# Patient Record
Sex: Male | Born: 1950
Health system: Southern US, Community
[De-identification: ages and names within clinical notes are randomized; demographics above are authoritative.]

## PROBLEM LIST (undated history)

## (undated) DIAGNOSIS — K219 Gastro-esophageal reflux disease without esophagitis: Secondary | ICD-10-CM

## (undated) DIAGNOSIS — C9110 Chronic lymphocytic leukemia of B-cell type not having achieved remission: Secondary | ICD-10-CM

## (undated) DIAGNOSIS — F418 Other specified anxiety disorders: Secondary | ICD-10-CM

## (undated) DIAGNOSIS — M1990 Unspecified osteoarthritis, unspecified site: Secondary | ICD-10-CM

## (undated) DIAGNOSIS — Z720 Tobacco use: Secondary | ICD-10-CM

## (undated) DIAGNOSIS — J189 Pneumonia, unspecified organism: Secondary | ICD-10-CM

## (undated) DIAGNOSIS — I4510 Unspecified right bundle-branch block: Secondary | ICD-10-CM

## (undated) DIAGNOSIS — B259 Cytomegaloviral disease, unspecified: Secondary | ICD-10-CM

## (undated) DIAGNOSIS — R002 Palpitations: Secondary | ICD-10-CM

## (undated) DIAGNOSIS — J449 Chronic obstructive pulmonary disease, unspecified: Secondary | ICD-10-CM

## (undated) DIAGNOSIS — E785 Hyperlipidemia, unspecified: Secondary | ICD-10-CM

## (undated) DIAGNOSIS — I359 Nonrheumatic aortic valve disorder, unspecified: Secondary | ICD-10-CM

## (undated) HISTORY — DX: Chronic lymphocytic leukemia of B-cell type not having achieved remission: C91.10

## (undated) HISTORY — DX: Cytomegaloviral disease, unspecified: B25.9

## (undated) HISTORY — DX: Gastro-esophageal reflux disease without esophagitis: K21.9

## (undated) HISTORY — DX: Pneumonia, unspecified organism: J18.9

## (undated) HISTORY — DX: Unspecified right bundle-branch block: I45.10

## (undated) HISTORY — DX: Nonrheumatic aortic valve disorder, unspecified: I35.9

## (undated) HISTORY — DX: Chronic obstructive pulmonary disease, unspecified: J44.9

## (undated) HISTORY — DX: Unspecified osteoarthritis, unspecified site: M19.90

## (undated) HISTORY — DX: Tobacco use: Z72.0

## (undated) HISTORY — DX: Hyperlipidemia, unspecified: E78.5

## (undated) HISTORY — DX: Other specified anxiety disorders: F41.8

## (undated) HISTORY — DX: Palpitations: R00.2

---

## 1995-04-02 HISTORY — PX: GANGLION CYST EXCISION: SHX1691

## 1995-04-02 HISTORY — PX: ROTATOR CUFF REPAIR: SHX139

## 2001-09-28 ENCOUNTER — Ambulatory Visit (HOSPITAL_COMMUNITY): Admission: RE | Admit: 2001-09-28 | Discharge: 2001-09-28 | Payer: Self-pay | Admitting: Gastroenterology

## 2002-04-01 DIAGNOSIS — R002 Palpitations: Secondary | ICD-10-CM

## 2002-04-01 HISTORY — DX: Palpitations: R00.2

## 2003-01-12 ENCOUNTER — Encounter: Payer: Self-pay | Admitting: Family Medicine

## 2003-01-12 ENCOUNTER — Ambulatory Visit (HOSPITAL_COMMUNITY): Admission: RE | Admit: 2003-01-12 | Discharge: 2003-01-12 | Payer: Self-pay | Admitting: Family Medicine

## 2003-01-31 ENCOUNTER — Ambulatory Visit (HOSPITAL_COMMUNITY): Admission: RE | Admit: 2003-01-31 | Discharge: 2003-01-31 | Payer: Self-pay | Admitting: *Deleted

## 2003-02-14 ENCOUNTER — Encounter: Admission: RE | Admit: 2003-02-14 | Discharge: 2003-02-14 | Payer: Self-pay | Admitting: Oncology

## 2003-02-14 ENCOUNTER — Encounter (HOSPITAL_COMMUNITY): Admission: RE | Admit: 2003-02-14 | Discharge: 2003-03-16 | Payer: Self-pay | Admitting: Oncology

## 2003-04-28 ENCOUNTER — Encounter: Admission: RE | Admit: 2003-04-28 | Discharge: 2003-04-28 | Payer: Self-pay | Admitting: Oncology

## 2003-04-28 ENCOUNTER — Encounter (HOSPITAL_COMMUNITY): Admission: RE | Admit: 2003-04-28 | Discharge: 2003-05-28 | Payer: Self-pay | Admitting: Oncology

## 2003-07-04 ENCOUNTER — Encounter (HOSPITAL_COMMUNITY): Admission: RE | Admit: 2003-07-04 | Discharge: 2003-08-03 | Payer: Self-pay | Admitting: Oncology

## 2003-07-04 ENCOUNTER — Encounter: Admission: RE | Admit: 2003-07-04 | Discharge: 2003-07-04 | Payer: Self-pay | Admitting: Oncology

## 2004-01-02 ENCOUNTER — Encounter (HOSPITAL_COMMUNITY): Admission: RE | Admit: 2004-01-02 | Discharge: 2004-02-01 | Payer: Self-pay | Admitting: Oncology

## 2004-01-02 ENCOUNTER — Encounter: Admission: RE | Admit: 2004-01-02 | Discharge: 2004-01-02 | Payer: Self-pay | Admitting: Oncology

## 2004-02-14 ENCOUNTER — Ambulatory Visit (HOSPITAL_COMMUNITY): Admission: RE | Admit: 2004-02-14 | Discharge: 2004-02-14 | Payer: Self-pay | Admitting: Family Medicine

## 2004-04-09 ENCOUNTER — Ambulatory Visit (HOSPITAL_COMMUNITY): Admission: RE | Admit: 2004-04-09 | Discharge: 2004-04-09 | Payer: Self-pay | Admitting: Family Medicine

## 2004-06-29 ENCOUNTER — Ambulatory Visit (HOSPITAL_COMMUNITY): Payer: Self-pay | Admitting: Oncology

## 2004-06-29 ENCOUNTER — Encounter (HOSPITAL_COMMUNITY): Admission: RE | Admit: 2004-06-29 | Discharge: 2004-07-29 | Payer: Self-pay | Admitting: Oncology

## 2004-06-29 ENCOUNTER — Encounter: Admission: RE | Admit: 2004-06-29 | Discharge: 2004-06-29 | Payer: Self-pay | Admitting: Oncology

## 2004-08-06 ENCOUNTER — Ambulatory Visit (HOSPITAL_COMMUNITY): Admission: RE | Admit: 2004-08-06 | Discharge: 2004-08-06 | Payer: Self-pay | Admitting: Family Medicine

## 2004-10-22 ENCOUNTER — Ambulatory Visit (HOSPITAL_COMMUNITY): Admission: RE | Admit: 2004-10-22 | Discharge: 2004-10-22 | Payer: Self-pay | Admitting: Family Medicine

## 2004-11-26 ENCOUNTER — Ambulatory Visit (HOSPITAL_BASED_OUTPATIENT_CLINIC_OR_DEPARTMENT_OTHER): Admission: RE | Admit: 2004-11-26 | Discharge: 2004-11-26 | Payer: Self-pay | Admitting: Otolaryngology

## 2004-12-03 ENCOUNTER — Ambulatory Visit: Payer: Self-pay | Admitting: Internal Medicine

## 2005-01-04 ENCOUNTER — Encounter: Admission: RE | Admit: 2005-01-04 | Discharge: 2005-01-04 | Payer: Self-pay | Admitting: Oncology

## 2005-01-04 ENCOUNTER — Encounter (HOSPITAL_COMMUNITY): Admission: RE | Admit: 2005-01-04 | Discharge: 2005-02-03 | Payer: Self-pay | Admitting: Oncology

## 2005-01-07 ENCOUNTER — Ambulatory Visit (HOSPITAL_COMMUNITY): Payer: Self-pay | Admitting: Oncology

## 2005-02-12 ENCOUNTER — Encounter: Admission: RE | Admit: 2005-02-12 | Discharge: 2005-02-12 | Payer: Self-pay | Admitting: Oncology

## 2005-04-15 ENCOUNTER — Ambulatory Visit (HOSPITAL_COMMUNITY): Admission: RE | Admit: 2005-04-15 | Discharge: 2005-04-15 | Payer: Self-pay | Admitting: Family Medicine

## 2005-07-19 ENCOUNTER — Ambulatory Visit (HOSPITAL_COMMUNITY): Payer: Self-pay | Admitting: Oncology

## 2005-07-22 ENCOUNTER — Encounter: Admission: RE | Admit: 2005-07-22 | Discharge: 2005-07-22 | Payer: Self-pay | Admitting: Oncology

## 2005-07-22 ENCOUNTER — Encounter (HOSPITAL_COMMUNITY): Admission: RE | Admit: 2005-07-22 | Discharge: 2005-08-21 | Payer: Self-pay | Admitting: Oncology

## 2005-07-22 ENCOUNTER — Ambulatory Visit (HOSPITAL_COMMUNITY): Admission: RE | Admit: 2005-07-22 | Discharge: 2005-07-22 | Payer: Self-pay | Admitting: Otolaryngology

## 2005-09-30 ENCOUNTER — Ambulatory Visit: Payer: Self-pay | Admitting: *Deleted

## 2005-10-07 ENCOUNTER — Ambulatory Visit: Payer: Self-pay | Admitting: Internal Medicine

## 2005-10-07 ENCOUNTER — Ambulatory Visit (HOSPITAL_COMMUNITY): Admission: RE | Admit: 2005-10-07 | Discharge: 2005-10-07 | Payer: Self-pay | Admitting: Family Medicine

## 2005-10-09 ENCOUNTER — Encounter (HOSPITAL_COMMUNITY): Admission: RE | Admit: 2005-10-09 | Discharge: 2005-11-08 | Payer: Self-pay | Admitting: Oncology

## 2005-10-09 ENCOUNTER — Ambulatory Visit (HOSPITAL_COMMUNITY): Payer: Self-pay | Admitting: Oncology

## 2005-10-09 ENCOUNTER — Encounter: Admission: RE | Admit: 2005-10-09 | Discharge: 2005-10-09 | Payer: Self-pay | Admitting: Oncology

## 2005-10-14 ENCOUNTER — Ambulatory Visit (HOSPITAL_COMMUNITY): Admission: RE | Admit: 2005-10-14 | Discharge: 2005-10-14 | Payer: Self-pay | Admitting: *Deleted

## 2005-10-14 ENCOUNTER — Ambulatory Visit: Payer: Self-pay | Admitting: Cardiology

## 2005-10-22 ENCOUNTER — Ambulatory Visit: Payer: Self-pay | Admitting: *Deleted

## 2006-01-06 ENCOUNTER — Encounter (HOSPITAL_COMMUNITY): Admission: RE | Admit: 2006-01-06 | Discharge: 2006-02-05 | Payer: Self-pay | Admitting: Oncology

## 2006-01-06 ENCOUNTER — Encounter: Admission: RE | Admit: 2006-01-06 | Discharge: 2006-01-06 | Payer: Self-pay | Admitting: Oncology

## 2006-01-06 ENCOUNTER — Ambulatory Visit (HOSPITAL_COMMUNITY): Payer: Self-pay | Admitting: Oncology

## 2006-03-03 ENCOUNTER — Ambulatory Visit (HOSPITAL_COMMUNITY): Payer: Self-pay | Admitting: Oncology

## 2006-03-03 ENCOUNTER — Encounter (HOSPITAL_COMMUNITY): Admission: RE | Admit: 2006-03-03 | Discharge: 2006-03-31 | Payer: Self-pay | Admitting: Oncology

## 2006-04-28 ENCOUNTER — Ambulatory Visit (HOSPITAL_COMMUNITY): Payer: Self-pay | Admitting: Oncology

## 2006-04-28 ENCOUNTER — Encounter (HOSPITAL_COMMUNITY): Admission: RE | Admit: 2006-04-28 | Discharge: 2006-05-28 | Payer: Self-pay | Admitting: Oncology

## 2006-11-11 ENCOUNTER — Ambulatory Visit (HOSPITAL_COMMUNITY): Admission: RE | Admit: 2006-11-11 | Discharge: 2006-11-11 | Payer: Self-pay | Admitting: Family Medicine

## 2006-12-08 ENCOUNTER — Ambulatory Visit (HOSPITAL_COMMUNITY): Payer: Self-pay | Admitting: Oncology

## 2007-04-20 ENCOUNTER — Ambulatory Visit: Payer: Self-pay | Admitting: Cardiology

## 2007-06-05 ENCOUNTER — Ambulatory Visit (HOSPITAL_COMMUNITY): Payer: Self-pay | Admitting: Oncology

## 2007-06-05 ENCOUNTER — Encounter (HOSPITAL_COMMUNITY): Admission: RE | Admit: 2007-06-05 | Discharge: 2007-07-05 | Payer: Self-pay | Admitting: Oncology

## 2007-12-11 ENCOUNTER — Ambulatory Visit (HOSPITAL_COMMUNITY): Payer: Self-pay | Admitting: Oncology

## 2008-06-27 ENCOUNTER — Ambulatory Visit (HOSPITAL_COMMUNITY): Payer: Self-pay | Admitting: Oncology

## 2008-06-27 ENCOUNTER — Encounter (HOSPITAL_COMMUNITY): Admission: RE | Admit: 2008-06-27 | Discharge: 2008-07-28 | Payer: Self-pay | Admitting: Oncology

## 2008-07-15 ENCOUNTER — Encounter (INDEPENDENT_AMBULATORY_CARE_PROVIDER_SITE_OTHER): Payer: Self-pay | Admitting: *Deleted

## 2008-08-08 ENCOUNTER — Ambulatory Visit (HOSPITAL_COMMUNITY): Admission: RE | Admit: 2008-08-08 | Discharge: 2008-08-08 | Payer: Self-pay | Admitting: Family Medicine

## 2009-01-02 ENCOUNTER — Ambulatory Visit (HOSPITAL_COMMUNITY): Payer: Self-pay | Admitting: Oncology

## 2009-01-02 ENCOUNTER — Encounter (HOSPITAL_COMMUNITY): Admission: RE | Admit: 2009-01-02 | Discharge: 2009-02-01 | Payer: Self-pay | Admitting: Oncology

## 2009-02-10 ENCOUNTER — Ambulatory Visit (HOSPITAL_COMMUNITY): Admission: RE | Admit: 2009-02-10 | Discharge: 2009-02-10 | Payer: Self-pay | Admitting: Family Medicine

## 2009-07-03 ENCOUNTER — Ambulatory Visit (HOSPITAL_COMMUNITY): Payer: Self-pay | Admitting: Oncology

## 2009-07-03 ENCOUNTER — Encounter (HOSPITAL_COMMUNITY): Admission: RE | Admit: 2009-07-03 | Discharge: 2009-08-02 | Payer: Self-pay | Admitting: Oncology

## 2009-11-20 ENCOUNTER — Ambulatory Visit (HOSPITAL_COMMUNITY): Payer: Self-pay | Admitting: Oncology

## 2009-11-20 ENCOUNTER — Encounter (HOSPITAL_COMMUNITY): Admission: RE | Admit: 2009-11-20 | Discharge: 2009-12-20 | Payer: Self-pay | Admitting: Oncology

## 2010-03-05 ENCOUNTER — Ambulatory Visit (HOSPITAL_COMMUNITY)
Admission: RE | Admit: 2010-03-05 | Discharge: 2010-03-05 | Payer: Self-pay | Source: Home / Self Care | Admitting: Family Medicine

## 2010-05-14 ENCOUNTER — Ambulatory Visit (HOSPITAL_COMMUNITY): Payer: Medicare Other | Admitting: Oncology

## 2010-06-04 ENCOUNTER — Encounter (HOSPITAL_COMMUNITY): Payer: Medicare Other | Attending: Oncology

## 2010-06-04 ENCOUNTER — Other Ambulatory Visit (HOSPITAL_COMMUNITY): Payer: Medicare Other

## 2010-06-04 DIAGNOSIS — F17200 Nicotine dependence, unspecified, uncomplicated: Secondary | ICD-10-CM | POA: Insufficient documentation

## 2010-06-04 DIAGNOSIS — D839 Common variable immunodeficiency, unspecified: Secondary | ICD-10-CM | POA: Insufficient documentation

## 2010-06-04 DIAGNOSIS — J449 Chronic obstructive pulmonary disease, unspecified: Secondary | ICD-10-CM | POA: Insufficient documentation

## 2010-06-04 DIAGNOSIS — C9110 Chronic lymphocytic leukemia of B-cell type not having achieved remission: Secondary | ICD-10-CM

## 2010-06-04 DIAGNOSIS — Z79899 Other long term (current) drug therapy: Secondary | ICD-10-CM | POA: Insufficient documentation

## 2010-06-04 DIAGNOSIS — J4489 Other specified chronic obstructive pulmonary disease: Secondary | ICD-10-CM | POA: Insufficient documentation

## 2010-06-11 ENCOUNTER — Ambulatory Visit (HOSPITAL_COMMUNITY): Payer: Medicare Other | Admitting: Oncology

## 2010-06-11 ENCOUNTER — Other Ambulatory Visit (HOSPITAL_COMMUNITY): Payer: Self-pay | Admitting: Oncology

## 2010-06-11 DIAGNOSIS — C9110 Chronic lymphocytic leukemia of B-cell type not having achieved remission: Secondary | ICD-10-CM

## 2010-06-14 LAB — DIFFERENTIAL
Basophils Absolute: 0 10*3/uL (ref 0.0–0.1)
Basophils Relative: 0 % (ref 0–1)
Eosinophils Absolute: 0 10*3/uL (ref 0.0–0.7)
Eosinophils Relative: 0 % (ref 0–5)
Lymphocytes Relative: 86 % — ABNORMAL HIGH (ref 12–46)
Lymphs Abs: 72.5 10*3/uL — ABNORMAL HIGH (ref 0.7–4.0)
Monocytes Absolute: 4.2 10*3/uL — ABNORMAL HIGH (ref 0.1–1.0)
Monocytes Relative: 5 % (ref 3–12)
Neutro Abs: 7.6 10*3/uL (ref 1.7–7.7)
Neutrophils Relative %: 9 % — ABNORMAL LOW (ref 43–77)

## 2010-06-14 LAB — COMPREHENSIVE METABOLIC PANEL
ALT: 15 U/L (ref 0–53)
AST: 19 U/L (ref 0–37)
Albumin: 4.3 g/dL (ref 3.5–5.2)
Alkaline Phosphatase: 93 U/L (ref 39–117)
BUN: 17 mg/dL (ref 6–23)
CO2: 27 mEq/L (ref 19–32)
Calcium: 9 mg/dL (ref 8.4–10.5)
Chloride: 107 mEq/L (ref 96–112)
Creatinine, Ser: 0.9 mg/dL (ref 0.4–1.5)
GFR calc Af Amer: >60 mL/min (ref >60)
GFR calc non Af Amer: >60 mL/min (ref >60)
Glucose, Bld: 103 mg/dL — ABNORMAL HIGH (ref 70–99)
Potassium: 4.6 mEq/L (ref 3.5–5.1)
Sodium: 137 mEq/L (ref 135–145)
Total Bilirubin: 0.6 mg/dL (ref 0.3–1.2)
Total Protein: 6.1 g/dL (ref 6.0–8.3)

## 2010-06-14 LAB — PROTEIN ELECTROPHORESIS, SERUM
Albumin ELP: 68.3 % — ABNORMAL HIGH (ref 55.8–66.1)
Alpha-1-Globulin: 5.2 % — ABNORMAL HIGH (ref 2.9–4.9)
Alpha-2-Globulin: 10.1 % (ref 7.1–11.8)
Beta 2: 2.3 % — ABNORMAL LOW (ref 3.2–6.5)
Beta Globulin: 5.7 % (ref 4.7–7.2)
Gamma Globulin: 8.4 % — ABNORMAL LOW (ref 11.1–18.8)
M-Spike, %: NOT DETECTED g/dL
Total Protein ELP: 6.3 g/dL (ref 6.0–8.3)

## 2010-06-14 LAB — CBC
HCT: 44.9 % (ref 39.0–52.0)
Hemoglobin: 14.7 g/dL (ref 13.0–17.0)
MCH: 30.6 pg (ref 26.0–34.0)
MCHC: 32.7 g/dL (ref 30.0–36.0)
MCV: 93.5 fL (ref 78.0–100.0)
Platelets: 180 10*3/uL (ref 150–400)
RBC: 4.81 MIL/uL (ref 4.22–5.81)
RDW: 12.9 % (ref 11.5–15.5)
WBC: 84.3 10*3/uL (ref 4.0–10.5)

## 2010-06-14 LAB — IMMUNOFIXATION ADD-ON

## 2010-06-14 LAB — LACTATE DEHYDROGENASE: LDH: 122 U/L (ref 94–250)

## 2010-06-20 LAB — DIFFERENTIAL
Basophils Absolute: 0 10*3/uL (ref 0.0–0.1)
Basophils Relative: 0 % (ref 0–1)
Eosinophils Absolute: 0 10*3/uL (ref 0.0–0.7)
Eosinophils Relative: 0 % (ref 0–5)
Lymphocytes Relative: 82 % — ABNORMAL HIGH (ref 12–46)
Lymphs Abs: 77 10*3/uL — ABNORMAL HIGH (ref 0.7–4.0)
Monocytes Absolute: 7.5 10*3/uL — ABNORMAL HIGH (ref 0.1–1.0)
Monocytes Relative: 8 % (ref 3–12)
Neutro Abs: 9.4 10*3/uL — ABNORMAL HIGH (ref 1.7–7.7)
Neutrophils Relative %: 10 % — ABNORMAL LOW (ref 43–77)

## 2010-06-20 LAB — CBC
HCT: 46.3 % (ref 39.0–52.0)
Hemoglobin: 15.5 g/dL (ref 13.0–17.0)
MCHC: 33.4 g/dL (ref 30.0–36.0)
MCV: 93.1 fL (ref 78.0–100.0)
Platelets: 190 10*3/uL (ref 150–400)
RBC: 4.98 MIL/uL (ref 4.22–5.81)
RDW: 13.6 % (ref 11.5–15.5)
WBC: 93.9 10*3/uL (ref 4.0–10.5)

## 2010-07-05 LAB — COMPREHENSIVE METABOLIC PANEL
ALT: 15 U/L (ref 0–53)
AST: 19 U/L (ref 0–37)
Albumin: 4.3 g/dL (ref 3.5–5.2)
CO2: 29 mEq/L (ref 19–32)
Calcium: 9 mg/dL (ref 8.4–10.5)
GFR calc Af Amer: >60 mL/min (ref >60)
GFR calc non Af Amer: >60 mL/min (ref >60)
Sodium: 141 mEq/L (ref 135–145)

## 2010-07-05 LAB — DIFFERENTIAL
Eosinophils Absolute: 0.2 10*3/uL (ref 0.0–0.7)
Eosinophils Relative: 0 % (ref 0–5)
Lymphs Abs: 57.7 10*3/uL — ABNORMAL HIGH (ref 0.7–4.0)
Monocytes Absolute: 1.6 10*3/uL — ABNORMAL HIGH (ref 0.1–1.0)
Monocytes Relative: 2 % — ABNORMAL LOW (ref 3–12)

## 2010-07-05 LAB — CBC
MCHC: 33.7 g/dL (ref 30.0–36.0)
RBC: 4.96 MIL/uL (ref 4.22–5.81)
WBC: 67.3 10*3/uL (ref 4.0–10.5)

## 2010-07-12 LAB — CBC
HCT: 48.5 % (ref 39.0–52.0)
Hemoglobin: 16.4 g/dL (ref 13.0–17.0)
MCHC: 33.8 g/dL (ref 30.0–36.0)
MCV: 92.8 fL (ref 78.0–100.0)
RDW: 13.1 % (ref 11.5–15.5)

## 2010-07-12 LAB — DIFFERENTIAL
Basophils Relative: 0 % (ref 0–1)
Eosinophils Absolute: 0 10*3/uL (ref 0.0–0.7)
Eosinophils Relative: 0 % (ref 0–5)
Lymphs Abs: 45.9 10*3/uL — ABNORMAL HIGH (ref 0.7–4.0)
Monocytes Absolute: 2.3 10*3/uL — ABNORMAL HIGH (ref 0.1–1.0)
Neutro Abs: 9.2 10*3/uL — ABNORMAL HIGH (ref 1.7–7.7)

## 2010-08-14 NOTE — Letter (Signed)
April 20, 2007    Patrica Duel, M.D.  [address]  [place], [place] [address]   RE:  KEREM, GILMER  MRN:  [id number]  /  DOB:  [birth year]   Dear [name] Leriche:   Mr. Sevin was seen in the office today at your kind request for  palpitations.  As you know, this gentleman was previously seen by Dr.  Dorethea Clan for chest discomfort.  He had a borderline abnormal stress  Myoview study in 2004 and a an essentially normal study in 2007.  He is  said to have a bicuspid aortic valve, but his last echocardiogram did  not verify this.  Otherwise, he has no significant known heart disease  but does have a markedly positive family history.   In recent months, he has noted episodic sensations in his chest that he  finds difficult to characterize.  He does have a sense of something  coming up into his throat.  These are momentary and were astutely  identified by you as palpitations.  He has no exertional symptoms.  He  has been started on metoprolol with marked improvement.  He continues to  have some of these sensations, but they are decreased in frequency and  intensity.   He has chronic leukemia and is followed by Dr. Mariel Sleet.  He received a  few courses of chemotherapy, but has not required treatment of late.  He  continues to smoke cigarettes.  His history since his last office visit  is otherwise negative.   Family history, past medical, social history and review of systems were  reviewed.  There are no notable changes.   CURRENT MEDICATIONS:  1. Aspirin 325 mg daily.  2. Wellbutrin 150 mg daily.  3. Allegra 180 mg daily.  4. Nasonex.  5. Advicor 500/20 mg daily.  6. Calcium.  7. Fish oil.  8. Toprol 50 mg daily.   EXAM:  Pleasant gentleman in no acute distress.  The weight is 193, four  pounds more than in July 2007.  Blood pressure 125/70, heart rate 80  with some irregularity, respirations 16.  NECK:  No jugular venous distention; normal carotid upstrokes without  bruits.  HEENT:  Stained teeth in poor repair.  LUNGS:  Clear.  CARDIAC:  Normal first and second heart sounds; minimal systolic murmur  at the left sternal border; normal PMI.  ABDOMEN:  Soft and nontender; no splenomegaly appreciated; normal bowel  sounds, no bruits.  EXTREMITIES:  No edema; normal distal pulses.  NEUROMUSCULAR:  Symmetric strength and tone; normal cranial nerves.  ENDOCRINE:  No thyromegaly.  HEMATOPOIETIC:  No adenopathy.  PSYCHIATRIC:  Alert and oriented; normal affect.   Rhythm strip:  Sinus rhythm with sinus arrhythmia.  With prolonged  monitoring, a few PVCs occurred.  These reproduce the patient's  symptoms.   IMPRESSION:  Mr. Granquist has palpitations related to premature  ventricular contractions.  With a negative echocardiogram and stress  test in the past, these are almost certainly benign.  We will check a  magnesium level and a TSH level, but I expect normal results.  He can  use Toprol on a p.r.n. basis.  Please let me know at any time if I can  offer further assistance with this nice gentleman's care.    Sincerely,      Gerrit Friends. Dietrich Pates, MD, [facility]  Electronically Signed    RMR/MedQ  DD: 04/20/2007  DT: 04/20/2007  Job #: [id number]

## 2010-08-17 NOTE — Group Therapy Note (Signed)
   NAME:  Alfred Brown, Alfred Brown                          ACCOUNT NO.:  [id number]   MEDICAL RECORD NO.:  [id number]                   PATIENT TYPE:  OUT   LOCATION:  RAD                                  FACILITY:  [facility]   PHYSICIAN:  Vida Roller, M.D.                DATE OF BIRTH:  19-Jun-1950   DATE OF PROCEDURE:  01/31/2003  DATE OF DISCHARGE:                                   PROGRESS NOTE   INDICATIONS:  Mr. Krapf is a 60 year old male with no known history of  coronary artery disease who presents for evaluation of exertional dyspnea in  the context of an abnormal electrocardiogram with cardiac risk factors  notable for dyslipidemia, tobacco smoking and family history.   TEST DATA:  The patient exercised a total of 8 minutes and 47 seconds of  standard Bruce protocol achieving maximum heart rate of 151 (89% PM HR; 10.1  METS).  Blood pressure rose from 132/78 baseline to 198/80.   Serial EKG tracings revealed no significant ST abnormalities.  No  dysrhythmia noted.   The patient reported no chest discomfort during stress testing.  Test was  discontinued secondary to fatigue.   CONCLUSION:  Negative adequate routine treadmill test.  Cardiolite images  pending.     ________________________________________  ___________________________________________  Rozell Searing, P.A. [facility]                       Vida Roller, M.D.   GS/MEDQ  D:  01/31/2003  T:  01/31/2003  Job:  [id number]

## 2010-08-17 NOTE — Procedures (Signed)
[facility]                                EXERCISE TREADMILL   DARELL, SAPUTO                       MRN:          [id number]  DATE:10/07/2005                            DOB:          December 16, 1950    Patient is a 60 year old gentleman with a history of an abnormal Myoview in  the past (anterior defect not felt to be scar, LVF at the time of 60%).  Patient complains of some chest discomfort, has to evaluate rule out  ischemia.   STRESS DATA:  Patient underwent exercise stress testing on the Bruce  Protocol.  Baseline EKG showed sinus rhythm at 59 beats per minute, right  bundle branch block.  Baseline blood pressure was 132/70.   The patient exercised for 7 minutes, 52 seconds to a peak heart rate of 151  which was 90% predicted maximal.  Peak blood pressure is 182/68.  Patient  stopped because of fatigue, experienced no chest pain.  EKG showed no  significant ST changes to suggest ischemia.   IMPRESSION:  Exercise stress test.  Clinically and electrically negative for  ischemia.  Myoview scan pending.                                   Pricilla Riffle, MD, [facility]   PVR/MedQ  DD:  10/25/2005  DT:  10/25/2005  Job #:  [id number]

## 2010-08-17 NOTE — Procedures (Signed)
[name], [name]                ACCOUNT NO.:  [id number]   MEDICAL RECORD NO.:  [id number]          PATIENT TYPE:  OUT   LOCATION:  SLEEP CENTER                 [facility]AC[facility]   PHYSICIAN:  Clinton D. Maple Hudson, M.D. DATE OF BIRTH:  07/21/1950   DATE OF STUDY:  11/26/2004                            MULTIPLE SLEEP LATENCY TEST   REFERRING PHYSICIAN:  Lucky Cowboy, MD   INDICATION FOR STUDY:  Multiple sleep latency test requested for evaluation  of excessive daytime somnolence, narcolepsy, without cataplexy.  A baseline  diagnostic NPSG was done elsewhere on September 10, 2004 recording only 255  minutes of sleep with a sleep efficiency of 70.25% and REM 68% of total  sleep time.  Apnea-hypopnea index was 0 but snoring was described as severe.   EPWORTH SLEEPINESS SCORE:  5/24 - as reported by patient.  This is not  indicative of significant daytime sleepiness.   BMI:  25.9   WEIGHT:  192 pounds   HEIGHT:  6 feet   MEDICATIONS:  Aspirin 325 mg, Advair 200/50, Wellbutrin SR 150 mg, Zovirax  800 mg, Xanax 0.5 mg, Singulair 10 mg, Nasonex, multivitamins, Aleve.  No  medications were taken during this study day.   Nap Times:              Sleep Latency:                REM Latency:  1)  0800                      20 minutes                    Absent (latency  20 minutes)  2)  1000                      20 minutes                    20 minutes  3)  1200                      20 minutes                    20 minutes  4)  1400                      20 minutes                    20 minutes  5)  1600                      20 minutes                    20 minutes    MEAN SLEEP LATENCY:  20 minutes   NUMBER OF REM EPISODES:  None   COMMENTS:   IMPRESSIONS-RECOMMENDATIONS:  The patient did not sleep on any nap during  this study.  Together with his reported Epworth Sleepiness Score of 5/24,  there is no objective evidence of  daytime somnolence.      Clinton D. Maple Hudson, M.D.  Diplomate,  Biomedical engineer of Sleep Medicine  Electronically Signed     CDY/MEDQ  D:  12/02/2004 13:33:42  T:  12/02/2004 20:02:59  Job:  [id number]

## 2010-08-17 NOTE — Procedures (Signed)
[name], Alfred Brown                ACCOUNT NO.:  [id number]   MEDICAL RECORD NO.:  [id number]          PATIENT TYPE:  OUT   LOCATION:  RAD                           FACILITY:  [facility]   PHYSICIAN:  [place] Bing, M.D. [facility] OF BIRTH:  Dec 21, 1950   DATE OF PROCEDURE:  10/14/2005  DATE OF DISCHARGE:                                  ECHOCARDIOGRAM   REFERRING PHYSICIANS:  1.  Patrica Duel, M.D.  2.  Vida Roller, M.D.   CLINICAL DATA:  A 60 year old gentleman with bicuspid aortic valve and  murmur.  M-mode aorta 3.4, left atrium 3.9, septum 1.4, posterior wall 1.2.  LV diastole 4.8, LV systole 3.5.   1.  Technically adequate echocardiographic study.  2.  Normal left atrium, right atrium and right ventricle.  3.  Trileaflet aortic valve with mild to moderate sclerosis of the leaflets;      reasonably good leaflet excursion; very mild insufficiency and trivial,      if any, stenosis by Doppler.  4.  Normal diameter of the proximal ascending aorta with mild calcification      of the wall.  5.  Mild mitral valve thickening; mild annular calcification.  6.  Normal tricuspid and pulmonic valves; normal proximal pulmonary artery.  7.  Normal left ventricular size; borderline hypertrophy; normal regional      and global function.  8.  Comparison with prior study of January 31, 2003:  No significant      interval change.      [place] Bing, M.D. [facility]  Electronically Signed     RR/MEDQ  D:  10/14/2005  T:  10/15/2005  Job:  [id number]

## 2010-09-03 ENCOUNTER — Other Ambulatory Visit (HOSPITAL_COMMUNITY): Payer: Self-pay | Admitting: Oncology

## 2010-09-03 ENCOUNTER — Encounter (HOSPITAL_COMMUNITY): Payer: Medicare Other | Attending: Oncology

## 2010-09-03 DIAGNOSIS — C9110 Chronic lymphocytic leukemia of B-cell type not having achieved remission: Secondary | ICD-10-CM | POA: Insufficient documentation

## 2010-09-03 DIAGNOSIS — J4489 Other specified chronic obstructive pulmonary disease: Secondary | ICD-10-CM | POA: Insufficient documentation

## 2010-09-03 DIAGNOSIS — Z79899 Other long term (current) drug therapy: Secondary | ICD-10-CM | POA: Insufficient documentation

## 2010-09-03 DIAGNOSIS — F17200 Nicotine dependence, unspecified, uncomplicated: Secondary | ICD-10-CM | POA: Insufficient documentation

## 2010-09-03 DIAGNOSIS — D839 Common variable immunodeficiency, unspecified: Secondary | ICD-10-CM | POA: Insufficient documentation

## 2010-09-03 DIAGNOSIS — J449 Chronic obstructive pulmonary disease, unspecified: Secondary | ICD-10-CM | POA: Insufficient documentation

## 2010-09-03 LAB — CBC
MCH: 30.5 pg (ref 26.0–34.0)
MCHC: 32.6 g/dL (ref 30.0–36.0)
MCV: 93.6 fL (ref 78.0–100.0)
Platelets: 190 10*3/uL (ref 150–400)

## 2010-09-03 LAB — DIFFERENTIAL
Basophils Relative: 0 % (ref 0–1)
Eosinophils Absolute: 0.2 10*3/uL (ref 0.0–0.7)
Eosinophils Relative: 0 % (ref 0–5)
Neutrophils Relative %: 7 % — ABNORMAL LOW (ref 43–77)

## 2010-09-10 ENCOUNTER — Ambulatory Visit (HOSPITAL_COMMUNITY)
Admission: RE | Admit: 2010-09-10 | Discharge: 2010-09-10 | Disposition: A | Discharge status: Home / Self Care | Payer: Medicare Other | Source: Ambulatory Visit | Attending: Oncology | Admitting: Oncology

## 2010-09-10 DIAGNOSIS — C9110 Chronic lymphocytic leukemia of B-cell type not having achieved remission: Secondary | ICD-10-CM | POA: Insufficient documentation

## 2010-09-17 ENCOUNTER — Encounter (HOSPITAL_COMMUNITY): Payer: Medicare Other | Admitting: Oncology

## 2010-09-17 DIAGNOSIS — C9110 Chronic lymphocytic leukemia of B-cell type not having achieved remission: Secondary | ICD-10-CM

## 2010-12-24 ENCOUNTER — Encounter (HOSPITAL_COMMUNITY): Payer: Medicare Other | Attending: Oncology

## 2010-12-24 DIAGNOSIS — C9110 Chronic lymphocytic leukemia of B-cell type not having achieved remission: Secondary | ICD-10-CM

## 2010-12-24 LAB — LACTATE DEHYDROGENASE
LDH: 132
LDH: 156 U/L (ref 94–250)

## 2010-12-24 LAB — COMPREHENSIVE METABOLIC PANEL
AST: 16 U/L (ref 0–37)
Albumin: 3.8
Albumin: 4.1 g/dL (ref 3.5–5.2)
Alkaline Phosphatase: 90
BUN: 13 mg/dL (ref 6–23)
BUN: 15
CO2: 26
Chloride: 105
Chloride: 106 mEq/L (ref 96–112)
Creatinine, Ser: 0.81
Creatinine, Ser: 0.94 mg/dL (ref 0.50–1.35)
GFR calc non Af Amer: >60
Glucose, Bld: 95
Potassium: 3.6
Total Bilirubin: 0.3
Total Bilirubin: 0.5 mg/dL (ref 0.3–1.2)
Total Protein: 6.5 g/dL (ref 6.0–8.3)

## 2010-12-24 LAB — DIFFERENTIAL
Basophils Absolute: 0
Basophils Relative: 0
Basophils Relative: 0 % (ref 0–1)
Blasts: 0
Lymphocytes Relative: 83 — ABNORMAL HIGH
Lymphocytes Relative: 90 % — ABNORMAL HIGH (ref 12–46)
Lymphs Abs: 78.6 10*3/uL — ABNORMAL HIGH (ref 0.7–4.0)
Monocytes Absolute: 1.7 10*3/uL — ABNORMAL HIGH (ref 0.1–1.0)
Monocytes Relative: 2 % — ABNORMAL LOW (ref 3–12)
Myelocytes: 0
Neutro Abs: 6.6
Neutro Abs: 6.5 10*3/uL (ref 1.7–7.7)
Neutrophils Relative %: 15 — ABNORMAL LOW
Neutrophils Relative %: 8 % — ABNORMAL LOW (ref 43–77)
Promyelocytes Absolute: 0

## 2010-12-24 LAB — CBC
HCT: 44.5
HCT: 45 % (ref 39.0–52.0)
Hemoglobin: 15.4
MCHC: 33.6 g/dL (ref 30.0–36.0)
MCV: 91.6
MCV: 92.8 fL (ref 78.0–100.0)
Platelets: 226
Platelets: 189 10*3/uL (ref 150–400)
RBC: 4.86
RDW: 13.9 % (ref 11.5–15.5)
WBC: 44.1 — ABNORMAL HIGH
WBC: 87.1 10*3/uL (ref 4.0–10.5)

## 2010-12-24 NOTE — Progress Notes (Signed)
Labs drawn today for cbc/diff,cmp,ldh 

## 2011-01-02 LAB — COMPREHENSIVE METABOLIC PANEL
ALT: 16
Alkaline Phosphatase: 94
BUN: 11
CO2: 26
GFR calc non Af Amer: >60
Glucose, Bld: 100 — ABNORMAL HIGH
Potassium: 3.8
Sodium: 139
Total Bilirubin: 0.7

## 2011-01-02 LAB — DIFFERENTIAL
Blasts: 0
Eosinophils Absolute: 0
Metamyelocytes Relative: 0
Myelocytes: 0
Promyelocytes Absolute: 0
nRBC: 0

## 2011-01-02 LAB — CBC
HCT: 45.8
Hemoglobin: 15.6
MCHC: 34
RBC: 4.97

## 2011-01-07 ENCOUNTER — Encounter (HOSPITAL_COMMUNITY): Payer: Self-pay | Admitting: Oncology

## 2011-01-07 ENCOUNTER — Encounter (HOSPITAL_COMMUNITY): Payer: Medicare Other | Attending: Oncology | Admitting: Oncology

## 2011-01-07 VITALS — BP 133/81 | HR 65 | Temp 98.2°F | Ht 72.0 in | Wt 184.4 lb

## 2011-01-07 DIAGNOSIS — C9110 Chronic lymphocytic leukemia of B-cell type not having achieved remission: Secondary | ICD-10-CM

## 2011-01-07 DIAGNOSIS — D801 Nonfamilial hypogammaglobulinemia: Secondary | ICD-10-CM

## 2011-01-07 NOTE — Progress Notes (Signed)
This office note has been dictated.

## 2011-01-07 NOTE — Patient Instructions (Signed)
[facility]  Discharge Instructions  RECOMMENDATIONS MADE BY THE CONSULTANT AND ANY TEST RESULTS WILL BE SENT TO YOUR REFERRING DOCTOR.   EXAM FINDINGS BY MD TODAY AND SIGNS AND SYMPTOMS TO REPORT TO CLINIC OR PRIMARY MD: doing well.  MEDICATIONS PRESCRIBED: none      SPECIAL INSTRUCTIONS/FOLLOW-UP: Report any infections, fevers, night sweats etc. Lab work Needed as schedule and MD appointment after.   I acknowledge that I have been informed and understand all the instructions given to me and received a copy. I do not have any more questions at this time, but understand that I may call the Specialty Clinic at [facility] at [phone] during business hours should I have any further questions or need assistance in obtaining follow-up care.    __________________________________________  _____________  __________ Signature of Patient or Authorized Representative            Date                   Time    __________________________________________ Nurse's Signature

## 2011-01-07 NOTE — Progress Notes (Signed)
CC:   Madelin Rear. Sherwood Gambler, MD  DIAGNOSES: 1. Chronic lymphocytic leukemia presenting years ago with a gradually     rising white count, but no need for therapy.  Unfortunately he does     have hypogammaglobulinemia.  He presented here in November 2004. 2. Intermittent sinusitis, one episode this year. 3. Chronic obstructive pulmonary disease, still smoking a pack a day,     down from 2 packs a day. 4. Cytomegalovirus positive antibody titer in the past. 5. 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 6. Mild depression. 7. Chronic anxiety on Xanax. 8. Rotator cuff surgery [id number]. 9. Degenerative joint disease of the left knee. 10.Lipoma of the left chest wall unchanged. 11.Ganglion cyst removed from his left wrist in 1997. 12.Excessive alcohol use years ago, quitting in 1994. 13.Tick bites in August 2004 treated with doxycycline with resolution     of a rash but again persistence of weakness and fatigue and he     either has a post tick syndrome or possibly post cytomegalovirus     syndrome. 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  We checked them last in March 2012.  They are all slightly lower than they were in 2007.  His IgG level for example is 568.  His IgA level is 65, IgM level is only 23.  He has never had an M-spike.  I think if he quit smoking it would be interesting to see if he would require any antibiotics for sinusitis since that may be causing this chronic inflammation and irritation susceptibility just as much as is hypogammaglobulinemia potentially.  He has however not needed IVIG therapy since I have seen him.  At one point we did treat him with chlorambucil and prednisone for 4 cycles back in 2007 to see if that would make him feel better.  It made absolutely no difference in his sense of well being, even though it improved his counts  slightly.  His physical exam still shows no hepatosplenomegaly, no adenopathy. Stable vital signs.  Lungs show clear breath sounds but decreased breath sounds overall.  He still moves air fairly well.  He has a heart which shows a grade 1/6 systolic ejection murmur without S3 gallop.  I think overall he needs to just be watched.  We did of course do a CT scan in June of this year.  No evidence for hepatosplenomegaly, no evidence for enlargement of lymph nodes.  So, again, I do not think he is someone we have to treat again unless he is more symptomatic.   ______________________________ Ladona Horns. Mariel Sleet, MD ESN/MEDQ  D:  01/07/2011  T:  01/07/2011  Job:  [id number]

## 2011-04-24 DIAGNOSIS — J019 Acute sinusitis, unspecified: Secondary | ICD-10-CM | POA: Diagnosis not present

## 2011-04-24 DIAGNOSIS — J069 Acute upper respiratory infection, unspecified: Secondary | ICD-10-CM | POA: Diagnosis not present

## 2011-04-24 DIAGNOSIS — IMO0002 Reserved for concepts with insufficient information to code with codable children: Secondary | ICD-10-CM | POA: Diagnosis not present

## 2011-04-24 DIAGNOSIS — F411 Generalized anxiety disorder: Secondary | ICD-10-CM | POA: Diagnosis not present

## 2011-05-06 ENCOUNTER — Encounter (HOSPITAL_COMMUNITY): Payer: Medicare Other | Attending: Oncology

## 2011-05-06 DIAGNOSIS — C9110 Chronic lymphocytic leukemia of B-cell type not having achieved remission: Secondary | ICD-10-CM | POA: Insufficient documentation

## 2011-05-06 LAB — DIFFERENTIAL
Basophils Absolute: 0.4 10*3/uL — ABNORMAL HIGH (ref 0.0–0.1)
Basophils Relative: 0 % (ref 0–1)
Monocytes Relative: 2 % — ABNORMAL LOW (ref 3–12)
Neutro Abs: 9.4 10*3/uL — ABNORMAL HIGH (ref 1.7–7.7)
Neutrophils Relative %: 8 % — ABNORMAL LOW (ref 43–77)

## 2011-05-06 LAB — CBC
HCT: 47.3 % (ref 39.0–52.0)
MCV: 94.2 fL (ref 78.0–100.0)
RDW: 13.6 % (ref 11.5–15.5)
WBC: 116.4 10*3/uL (ref 4.0–10.5)

## 2011-05-06 LAB — COMPREHENSIVE METABOLIC PANEL
Albumin: 3.9 g/dL (ref 3.5–5.2)
BUN: 14 mg/dL (ref 6–23)
Chloride: 107 mEq/L (ref 96–112)
Creatinine, Ser: 0.92 mg/dL (ref 0.50–1.35)
GFR calc non Af Amer: >90 mL/min (ref >90)
Total Bilirubin: 0.4 mg/dL (ref 0.3–1.2)

## 2011-05-06 LAB — LACTATE DEHYDROGENASE: LDH: 157 U/L (ref 94–250)

## 2011-05-06 NOTE — Progress Notes (Signed)
Labs drawn today for cbc/diff,cmp,ldh 

## 2011-05-09 ENCOUNTER — Ambulatory Visit (HOSPITAL_COMMUNITY): Payer: Medicare Other | Admitting: Oncology

## 2011-05-13 ENCOUNTER — Ambulatory Visit (HOSPITAL_COMMUNITY): Payer: Medicare Other | Admitting: Oncology

## 2011-05-13 ENCOUNTER — Encounter (HOSPITAL_BASED_OUTPATIENT_CLINIC_OR_DEPARTMENT_OTHER): Payer: Medicare Other | Admitting: Oncology

## 2011-05-13 VITALS — BP 133/71 | HR 80 | Temp 98.0°F | Wt 186.0 lb

## 2011-05-13 DIAGNOSIS — Z125 Encounter for screening for malignant neoplasm of prostate: Secondary | ICD-10-CM | POA: Diagnosis not present

## 2011-05-13 DIAGNOSIS — B259 Cytomegaloviral disease, unspecified: Secondary | ICD-10-CM | POA: Diagnosis not present

## 2011-05-13 DIAGNOSIS — D801 Nonfamilial hypogammaglobulinemia: Secondary | ICD-10-CM | POA: Diagnosis not present

## 2011-05-13 DIAGNOSIS — C9110 Chronic lymphocytic leukemia of B-cell type not having achieved remission: Secondary | ICD-10-CM | POA: Diagnosis not present

## 2011-05-13 DIAGNOSIS — J449 Chronic obstructive pulmonary disease, unspecified: Secondary | ICD-10-CM

## 2011-05-13 NOTE — Progress Notes (Signed)
DIAGNOSES: 1. Chronic lymphocytic leukemia presenting in November 2004 with a     gradually rising white count and hypogammaglobulinemia, still not     in need of therapy. 2. Intermittent sinusitis, and he just finished an antibiotic less     than 10 days ago.  When he had his lab work done he was in his 13th     or 14th day or towards the end of his 7-10 day course.  His wife is     not sure how many days of the antibiotic he received. 3. Chronic obstructive pulmonary disease, still smoking but less than     a pack a day now down from 2 packs a day. 4. Cytomegalovirus positive antibody titer in the past. 5. 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 6. Mild depression which is much improved. 7. Chronic anxiety on Xanax. 8. Rotator cuff surgery in 1997. 9. Degenerative joint disease of the left knee. 10.Lipoma of the left chest wall which is unchanged. 11.Ganglion cyst removal from the left wrist in 1997. 12.Excessive alcohol use years ago, quit in 1994. 13.Tick bites in August 2004, treated with doxycycline with resolution     of a rash, but again persistence of weakness and fatigue and either     had a post tick syndrome or possibly post cytomegalovirus syndrome.  Alfred Brown is here today with his wife.  His labs the other day do show that his white count has risen to over 100,000 but his hemoglobin is perfectly normal, platelets remain perfectly normal.  Liver enzymes normal. Electrolytes normal.  His albumin is fine etc.  LDH is normal.  He looks great.  We did a CAT scan last year which showed no adenopathy. No splenomegaly.  His vital signs today are stable.  Review of systems is unremarkable. The sinus infection he states is getting better and better, and is essentially gone.  His lungs today are perfectly clear.  He has decreased breath sounds, hyperresonance to percussion, but again, normal breath sounds as far as no rales or  rhonchi.  No wheezes.  He has no adenopathy in the cervical, supraclavicular, infraclavicular, axillary, or inguinal areas.  Abdomen is soft and nontender without hepatosplenomegaly.  He has no abdominal masses.  Heart shows a regular rhythm and rate with a grade 1-2/6 systolic ejection murmur at the apex. He has no gynecomastia.  Skin is unremarkable.  So I think he still just needs to be observed.  I do not think we should just treat his white count and his white count only.  So I am going to bring him back in about 5 weeks for repeat CBC and differential, and just see him at that point in time, see if the white count has calmed down a little bit since it may have been just from the infection.  With his hypogammaglobulinemia if he is not getting over things with just an antibiotic in the future, he may need IVIG, but right now I do not think he needs chemotherapy.   ______________________________ Ladona Horns. Mariel Sleet, MD ESN/MEDQ  D:  05/13/2011  T:  05/13/2011  Job:  [id number]

## 2011-05-13 NOTE — Progress Notes (Signed)
This office note has been dictated.

## 2011-05-13 NOTE — Patient Instructions (Signed)
[facility]  Discharge Instructions  RECOMMENDATIONS MADE BY THE CONSULTANT AND ANY TEST RESULTS WILL BE SENT TO YOUR REFERRING DOCTOR.  Continue what you are doing. We will have you come back in March for lab work and then to see the doctor.   I acknowledge that I have been informed and understand all the instructions given to me and received a copy. I do not have any more questions at this time, but understand that I may call the Specialty Clinic at [facility] at [phone] during business hours should I have any further questions or need assistance in obtaining follow-up care.    __________________________________________  _____________  __________ Signature of Patient or Authorized Representative            Date                   Time    __________________________________________ Nurse's Signature

## 2011-06-17 ENCOUNTER — Encounter (HOSPITAL_COMMUNITY): Payer: Medicare Other | Attending: Oncology

## 2011-06-17 DIAGNOSIS — C9110 Chronic lymphocytic leukemia of B-cell type not having achieved remission: Secondary | ICD-10-CM | POA: Diagnosis not present

## 2011-06-17 LAB — DIFFERENTIAL
Eosinophils Absolute: 0.2 10*3/uL (ref 0.0–0.7)
Eosinophils Relative: 0 % (ref 0–5)
Lymphs Abs: 91.3 10*3/uL — ABNORMAL HIGH (ref 0.7–4.0)
Monocytes Absolute: 1.7 10*3/uL — ABNORMAL HIGH (ref 0.1–1.0)
Monocytes Relative: 2 % — ABNORMAL LOW (ref 3–12)
Neutrophils Relative %: 8 % — ABNORMAL LOW (ref 43–77)

## 2011-06-17 LAB — CBC
HCT: 45.9 % (ref 39.0–52.0)
Hemoglobin: 15.2 g/dL (ref 13.0–17.0)
MCH: 30.8 pg (ref 26.0–34.0)
MCV: 92.9 fL (ref 78.0–100.0)
RBC: 4.94 MIL/uL (ref 4.22–5.81)

## 2011-06-17 NOTE — Progress Notes (Signed)
CRITICAL VALUE ALERT Critical value received:  WBC of 100.000 Date of notification:  3/18  Time of notification: 1048  Critical value read back:  yes Nurse who received alert:  Tobie Lords, RN

## 2011-06-21 ENCOUNTER — Ambulatory Visit (HOSPITAL_COMMUNITY): Payer: Medicare Other | Admitting: Oncology

## 2011-06-24 ENCOUNTER — Encounter (HOSPITAL_BASED_OUTPATIENT_CLINIC_OR_DEPARTMENT_OTHER): Payer: Medicare Other | Admitting: Oncology

## 2011-06-24 VITALS — BP 147/72 | HR 77 | Temp 97.9°F | Wt 186.7 lb

## 2011-06-24 DIAGNOSIS — F17200 Nicotine dependence, unspecified, uncomplicated: Secondary | ICD-10-CM | POA: Diagnosis not present

## 2011-06-24 DIAGNOSIS — J449 Chronic obstructive pulmonary disease, unspecified: Secondary | ICD-10-CM

## 2011-06-24 DIAGNOSIS — R5383 Other fatigue: Secondary | ICD-10-CM | POA: Diagnosis not present

## 2011-06-24 DIAGNOSIS — C9110 Chronic lymphocytic leukemia of B-cell type not having achieved remission: Secondary | ICD-10-CM

## 2011-06-24 DIAGNOSIS — R5381 Other malaise: Secondary | ICD-10-CM

## 2011-06-24 NOTE — Progress Notes (Signed)
CC:   Alfred Brown. Alfred Gambler, MD  DIAGNOSES: 1. Chronic lymphocytic leukemia presenting with early disease in     November 2004.  He does have hypogammaglobulinemia, he does have a     rising white count, but still has a normal hemoglobin, normal     hematocrit, normal platelet count, and no symptoms referable to     this disease. 2. Dry skin over his forehead, and his wife states that he gets herpes     simplex lesions occasionally on his forehead which I have not seen     before, but I have recommend some Cetaphil cream twice a day for     the dry skin.  Right now, I do not see any skin cancers. 3. History of precancerous and cancerous skin lesions in the past to     the face. 4. Intermittent sinusitis, again with a history of     hypogammaglobulinemia. 5. Chronic obstructive pulmonary disease, still smoking about a pack a     day. 6. Cytomegalovirus positive antibody titer in the past. 7. Weakness and fatigue from post cytomegalovirus syndrome versus a     tick bite in August 2004. 8. Chronic anxiety on Xanax. 9. Rotator cuff surgery in 1997. 10.Degenerative joint disease of the left knee. 11.Next ganglion cyst removed from left wrist in 1997. 12.Mild depression which is much improved. 13.Excessive alcohol use years ago, quitting in 1994. 14.Tick bites in August 2004, treated with doxycycline with resolution     of a rash, but again persistence of weakness and fatigue from     either a post tick syndrome or possibly post cytomegalovirus     syndrome.  We did treat him with chemotherapy years ago to see if that was a cause of his fatigue, but it never made any difference.  We stopped therapy and have not treated him since.  It was basically chlorambucil and prednisone based therapy.  His white count though is over 100,000 and they are predominantly lymphocytes, but his hemoglobin and platelets remain in the normal range.  He still has no obvious adenopathy, but his spleen today  is palpable approximately 2 cm below the costal margin in the right lateral decubitus position on deep inspiration.  I cannot feel it in the recumbent position.  His legs are without edema.  He has no obvious hepatomegaly.  He needs to be watched.  We will see him in 4 months.  We will see him sooner if need be.  His white count again the other day was 101,000, hemoglobin 15.2 g, platelets 198,000.    ______________________________ Ladona Horns. Mariel Sleet, MD ESN/MEDQ  D:  06/24/2011  T:  06/24/2011  Job:  [id number]

## 2011-06-24 NOTE — Progress Notes (Signed)
This office note has been dictated.

## 2011-06-24 NOTE — Patient Instructions (Signed)
Alfred Brown [facility]  [id number] Dec 17, 1950  [facility]  Discharge Instructions  RECOMMENDATIONS MADE BY THE CONSULTANT AND ANY TEST RESULTS WILL BE SENT TO YOUR REFERRING DOCTOR.   EXAM FINDINGS BY MD TODAY AND SIGNS AND SYMPTOMS TO REPORT TO CLINIC OR PRIMARY MD: White blood count is a little lower.  Your are doing ok and we don't need to treat you right now.  MEDICATIONS PRESCRIBED: none   INSTRUCTIONS GIVEN AND DISCUSSED: Other :  Report night sweats, recurring infections, shortness of breath, etc.  SPECIAL INSTRUCTIONS/FOLLOW-UP: Lab work Needed in 4 months and Return to Clinic after labs to see MD.   I acknowledge that I have been informed and understand all the instructions given to me and received a copy. I do not have any more questions at this time, but understand that I may call the Specialty Clinic at [facility] at [phone] during business hours should I have any further questions or need assistance in obtaining follow-up care.    __________________________________________  _____________  __________ Signature of Patient or Authorized Representative            Date                   Time    __________________________________________ Nurse's Signature

## 2011-06-26 ENCOUNTER — Encounter: Payer: Self-pay | Admitting: Gastroenterology

## 2011-07-01 DIAGNOSIS — F411 Generalized anxiety disorder: Secondary | ICD-10-CM | POA: Diagnosis not present

## 2011-07-01 DIAGNOSIS — K219 Gastro-esophageal reflux disease without esophagitis: Secondary | ICD-10-CM | POA: Diagnosis not present

## 2011-07-01 DIAGNOSIS — Z6825 Body mass index (BMI) 25.0-25.9, adult: Secondary | ICD-10-CM | POA: Diagnosis not present

## 2011-07-01 DIAGNOSIS — J019 Acute sinusitis, unspecified: Secondary | ICD-10-CM | POA: Diagnosis not present

## 2011-07-19 NOTE — Progress Notes (Signed)
Labs drawn

## 2011-09-23 DIAGNOSIS — J069 Acute upper respiratory infection, unspecified: Secondary | ICD-10-CM | POA: Diagnosis not present

## 2011-09-23 DIAGNOSIS — IMO0002 Reserved for concepts with insufficient information to code with codable children: Secondary | ICD-10-CM | POA: Diagnosis not present

## 2011-09-23 DIAGNOSIS — Z719 Counseling, unspecified: Secondary | ICD-10-CM | POA: Diagnosis not present

## 2011-10-14 ENCOUNTER — Telehealth (HOSPITAL_COMMUNITY): Payer: Self-pay

## 2011-10-14 ENCOUNTER — Encounter (HOSPITAL_COMMUNITY): Payer: Medicare Other | Attending: Oncology

## 2011-10-14 DIAGNOSIS — J449 Chronic obstructive pulmonary disease, unspecified: Secondary | ICD-10-CM | POA: Diagnosis not present

## 2011-10-14 DIAGNOSIS — F411 Generalized anxiety disorder: Secondary | ICD-10-CM | POA: Diagnosis not present

## 2011-10-14 DIAGNOSIS — M1710 Unilateral primary osteoarthritis, unspecified knee: Secondary | ICD-10-CM | POA: Insufficient documentation

## 2011-10-14 DIAGNOSIS — J4489 Other specified chronic obstructive pulmonary disease: Secondary | ICD-10-CM | POA: Insufficient documentation

## 2011-10-14 DIAGNOSIS — C9110 Chronic lymphocytic leukemia of B-cell type not having achieved remission: Secondary | ICD-10-CM | POA: Diagnosis not present

## 2011-10-14 DIAGNOSIS — D801 Nonfamilial hypogammaglobulinemia: Secondary | ICD-10-CM | POA: Diagnosis not present

## 2011-10-14 DIAGNOSIS — F17200 Nicotine dependence, unspecified, uncomplicated: Secondary | ICD-10-CM | POA: Insufficient documentation

## 2011-10-14 LAB — CBC
HCT: 45.9 % (ref 39.0–52.0)
MCH: 30.8 pg (ref 26.0–34.0)
MCV: 94.3 fL (ref 78.0–100.0)
RDW: 13.8 % (ref 11.5–15.5)
WBC: 110.4 10*3/uL (ref 4.0–10.5)

## 2011-10-14 LAB — COMPREHENSIVE METABOLIC PANEL
Alkaline Phosphatase: 105 U/L (ref 39–117)
BUN: 13 mg/dL (ref 6–23)
Chloride: 107 mEq/L (ref 96–112)
GFR calc Af Amer: >90 mL/min (ref >90)
Glucose, Bld: 106 mg/dL — ABNORMAL HIGH (ref 70–99)
Potassium: 3.8 mEq/L (ref 3.5–5.1)
Total Bilirubin: 0.5 mg/dL (ref 0.3–1.2)

## 2011-10-14 LAB — DIFFERENTIAL
Basophils Absolute: 0.4 10*3/uL — ABNORMAL HIGH (ref 0.0–0.1)
Eosinophils Relative: 0 % (ref 0–5)
Lymphocytes Relative: 91 % — ABNORMAL HIGH (ref 12–46)
Monocytes Absolute: 2.5 10*3/uL — ABNORMAL HIGH (ref 0.1–1.0)

## 2011-10-14 NOTE — Telephone Encounter (Signed)
CRITICAL VALUE ALERT Critical value received:  WBC of 110,400  Date of notification:  10/14/11  Time of notification: 1000 Critical value read back:  yes Nurse who received alert:  Dr. Mariel Sleet MD notified (1st page):  [phone]

## 2011-10-14 NOTE — Progress Notes (Signed)
Labs drawn today for cbc/diff,cmp,ldh 

## 2011-10-21 ENCOUNTER — Encounter (HOSPITAL_COMMUNITY): Payer: Self-pay | Admitting: Oncology

## 2011-10-21 ENCOUNTER — Encounter (HOSPITAL_BASED_OUTPATIENT_CLINIC_OR_DEPARTMENT_OTHER): Payer: Medicare Other | Admitting: Oncology

## 2011-10-21 VITALS — BP 131/75 | HR 75 | Temp 98.0°F | Wt 179.6 lb

## 2011-10-21 DIAGNOSIS — J449 Chronic obstructive pulmonary disease, unspecified: Secondary | ICD-10-CM | POA: Diagnosis not present

## 2011-10-21 DIAGNOSIS — F17200 Nicotine dependence, unspecified, uncomplicated: Secondary | ICD-10-CM | POA: Diagnosis not present

## 2011-10-21 DIAGNOSIS — D801 Nonfamilial hypogammaglobulinemia: Secondary | ICD-10-CM | POA: Diagnosis not present

## 2011-10-21 DIAGNOSIS — C9110 Chronic lymphocytic leukemia of B-cell type not having achieved remission: Secondary | ICD-10-CM | POA: Diagnosis not present

## 2011-10-21 NOTE — Progress Notes (Signed)
Problem #1 chronic lymphocytic leukemia with early disease at presentation in November 2004 however he does have hypogammaglobulinemia. His white count is rising gradually but his hemoglobin is still intact platelets are intact and he has no obvious hepatosplenomegaly or adenopathy. Problem #2 COPD still smoking half a pack of cigarettes a day with 3 bronchitis infections this year to antibiotics but with rapid response not in need of IVIG therapy.  Problem #3 CMV positive antibody titer in the past  Problem #4 tick bite in August 2004 to doxycycline with resolution of her rash though he may have post tick syndrome or possibly post CMV syndrome causing fatigue and weakness. Problem #5 DJD of the left knee along with rotator cuff surgery in 1997 problem #6 chronic anxiety on alprazolam problem #7 excessive alcohol use quitting in 1994  He is here today with his wife and he has no fevers chills night sweats no change in his sense of strength. His performance status is essentially 1. He has stable vital signs and he still has no palpable adenopathy in the cervical supraclavicular infraclavicular axillary or inguinal areas. He has no palpable hepatosplenomegaly. He has a flat abdomen normal bowel sounds. His lungs show a few rhonchi of the left upper lobe posteriorly only. There no wheezes rubs or rales. His heart shows a regular rhythm and rate without murmur rub or gallop. Nail color is intact there is no clubbing.  His white count is still slightly higher and 110,000 but hemoglobin platelets are perfect. We will continue observe him. He does need a CT scan of his chest low-dose CT that is due to his long-standing smoking history of well over 30 pack years. He is trying to quit with the help of a electronic cigarette. He is agreeable to the CT scan which we'll do sometime in the next week or 2. Otherwise we'll see him in 4 months but he has been knowledge that his hypogammaglobulinemia may be an issue and that  if he does not respond rapidly to an antibiotic and he may need IVIG. His wife understands is very clearly as well.

## 2011-10-21 NOTE — Patient Instructions (Addendum)
Alfred Brown [facility]  [id number] [birth year] Dr. Glenford Peers   [facility]  Discharge Instructions  RECOMMENDATIONS MADE BY THE CONSULTANT AND ANY TEST RESULTS WILL BE SENT TO YOUR REFERRING DOCTOR.   EXAM FINDINGS BY MD TODAY AND SIGNS AND SYMPTOMS TO REPORT TO CLINIC OR PRIMARY MD: your labs are stable.  MEDICATIONS PRESCRIBED: none   INSTRUCTIONS GIVEN AND DISCUSSED: Other :  Report night sweats, recurring or frequent infections, etc.  SPECIAL INSTRUCTIONS/FOLLOW-UP: Lab work Needed in November, Xray Studies Needed 7/29 and Return to Clinic on after labs in November.   I acknowledge that I have been informed and understand all the instructions given to me and received a copy. I do not have any more questions at this time, but understand that I may call the Specialty Clinic at [facility] at [phone] during business hours should I have any further questions or need assistance in obtaining follow-up care.    __________________________________________  _____________  __________ Signature of Patient or Authorized Representative            Date                   Time    __________________________________________ Nurse's Signature

## 2011-10-28 ENCOUNTER — Ambulatory Visit (HOSPITAL_COMMUNITY)
Admission: RE | Admit: 2011-10-28 | Discharge: 2011-10-28 | Disposition: A | Discharge status: Home / Self Care | Payer: Medicare Other | Source: Ambulatory Visit | Attending: Oncology | Admitting: Oncology

## 2011-10-28 DIAGNOSIS — F17200 Nicotine dependence, unspecified, uncomplicated: Secondary | ICD-10-CM | POA: Insufficient documentation

## 2011-10-28 DIAGNOSIS — J449 Chronic obstructive pulmonary disease, unspecified: Secondary | ICD-10-CM | POA: Diagnosis not present

## 2011-10-28 DIAGNOSIS — C9590 Leukemia, unspecified not having achieved remission: Secondary | ICD-10-CM | POA: Diagnosis not present

## 2011-10-28 DIAGNOSIS — J4489 Other specified chronic obstructive pulmonary disease: Secondary | ICD-10-CM | POA: Insufficient documentation

## 2011-12-25 ENCOUNTER — Encounter: Payer: Self-pay | Admitting: Gastroenterology

## 2012-01-20 DIAGNOSIS — Z23 Encounter for immunization: Secondary | ICD-10-CM | POA: Diagnosis not present

## 2012-01-27 ENCOUNTER — Ambulatory Visit (AMBULATORY_SURGERY_CENTER): Payer: Medicare Other | Admitting: *Deleted

## 2012-01-27 VITALS — Ht 72.0 in | Wt 185.0 lb

## 2012-01-27 DIAGNOSIS — Z1211 Encounter for screening for malignant neoplasm of colon: Secondary | ICD-10-CM

## 2012-01-27 MED ORDER — MOVIPREP 100 G PO SOLR: ORAL | Status: DC

## 2012-01-27 NOTE — Progress Notes (Signed)
Alfred Brown is going to verify with his cardiologist re:  Needing antibiotics prior to colon.

## 2012-01-31 HISTORY — PX: COLONOSCOPY: SHX174

## 2012-02-11 ENCOUNTER — Ambulatory Visit (AMBULATORY_SURGERY_CENTER): Payer: Medicare Other | Admitting: Gastroenterology

## 2012-02-11 ENCOUNTER — Encounter: Payer: Self-pay | Admitting: Gastroenterology

## 2012-02-11 VITALS — BP 129/72 | HR 73 | Temp 97.1°F | Resp 45 | Ht 72.0 in | Wt 185.0 lb

## 2012-02-11 DIAGNOSIS — Z1211 Encounter for screening for malignant neoplasm of colon: Secondary | ICD-10-CM

## 2012-02-11 MED ORDER — SODIUM CHLORIDE 0.9 % IV SOLN: 500.0000 mL | INTRAVENOUS | Status: DC

## 2012-02-11 NOTE — Progress Notes (Signed)
Patient did not experience any of the following events: a burn prior to discharge; a fall within the facility; wrong site/side/patient/procedure/implant event; or a hospital transfer or hospital admission upon discharge from the facility. (G8907) Patient did not have preoperative order for IV antibiotic SSI prophylaxis. (G8918)  

## 2012-02-11 NOTE — Progress Notes (Signed)
No complaints noted in the recovery room. Maw   

## 2012-02-11 NOTE — Op Note (Signed)
[facility] [address] [place], [address]   COLONOSCOPY PROCEDURE REPORT  PATIENT: [name], Alfred Wiltse.  MR#: [id number] BIRTHDATE: [birth year] , 61  yrs. old GENDER: Male ENDOSCOPIST: Meryl Dare, MD, [facility] PROCEDURE DATE:  02/11/2012 PROCEDURE:   Colonoscopy, screening ASA CLASS:   Class II INDICATIONS: average risk screening. MEDICATIONS: MAC sedation, administered by CRNA and propofol (Diprivan) 200mg  IV DESCRIPTION OF PROCEDURE:   After the risks benefits and alternatives of the procedure were thoroughly explained, informed consent was obtained.  A digital rectal exam revealed no abnormalities of the rectum.   The LB [id number]  endoscope was introduced through the anus and advanced to the cecum, which was identified by both the appendix and ileocecal valve. No adverse events experienced.   The quality of the prep was good, using MoviPrep  The instrument was then slowly withdrawn as the colon was fully examined.  COLON FINDINGS: Mild diverticulosis was noted in the descending colon. The colon was otherwise normal. There was no diverticulosis, inflammation, polyps or cancers unless previously stated. Retroflexed views revealed no abnormalities. The time to cecum=2 minutes 06 seconds.  Withdrawal time=9 minutes 28 seconds.  The scope was withdrawn and the procedure completed.  COMPLICATIONS: There were no complications.  ENDOSCOPIC IMPRESSION: 1.   Mild diverticulosis was noted in the descending colon 2.   The colon was otherwise normal  RECOMMENDATIONS: 1.  High fiber diet with liberal fluid intake. 2.  You should continue to follow colorectal cancer screening guidelines for "routine risk" patients with a repeat colonoscopy in 10 years.  There is no need for FOBT (stool) testing for at least 5 years.   eSigned:  Meryl Dare, MD, [facility] 02/11/2012 11:42 AM   cc: Elfredia Nevins, MD

## 2012-02-11 NOTE — Patient Instructions (Addendum)
Handouts were given to your care partner on diverticulosis and high fiber diet with liberal fluid intake.  You may resume your current medications today.  Please call if any questions or concerns.    YOU HAD AN ENDOSCOPIC PROCEDURE TODAY AT THE [facility]: Refer to the procedure report that was given to you for any specific questions about what was found during the examination.  If the procedure report does not answer your questions, please call your gastroenterologist to clarify.  If you requested that your care partner not be given the details of your procedure findings, then the procedure report has been included in a sealed envelope for you to review at your convenience later.  YOU SHOULD EXPECT: Some feelings of bloating in the abdomen. Passage of more gas than usual.  Walking can help get rid of the air that was put into your GI tract during the procedure and reduce the bloating. If you had a lower endoscopy (such as a colonoscopy or flexible sigmoidoscopy) you may notice spotting of blood in your stool or on the toilet paper. If you underwent a bowel prep for your procedure, then you may not have a normal bowel movement for a few days.  DIET: Your first meal following the procedure should be a light meal and then it is ok to progress to your normal diet.  A half-sandwich or bowl of soup is an example of a good first meal.  Heavy or fried foods are harder to digest and may make you feel nauseous or bloated.  Likewise meals heavy in dairy and vegetables can cause extra gas to form and this can also increase the bloating.  Drink plenty of fluids but you should avoid alcoholic beverages for 24 hours.  ACTIVITY: Your care partner should take you home directly after the procedure.  You should plan to take it easy, moving slowly for the rest of the day.  You can resume normal activity the day after the procedure however you should NOT DRIVE or use heavy machinery for 24 hours (because of the  sedation medicines used during the test).    SYMPTOMS TO REPORT IMMEDIATELY: A gastroenterologist can be reached at any hour.  During normal business hours, 8:30 AM to 5:00 PM Monday through Friday, call [phone].  After hours and on weekends, please call the GI answering service at [phone] who will take a message and have the physician on call contact you.   Following lower endoscopy (colonoscopy or flexible sigmoidoscopy):  Excessive amounts of blood in the stool  Significant tenderness or worsening of abdominal pains  Swelling of the abdomen that is new, acute  Fever of 100F or higher   FOLLOW UP: If any biopsies were taken you will be contacted by phone or by letter within the next 1-3 weeks.  Call your gastroenterologist if you have not heard about the biopsies in 3 weeks.  Our staff will call the home number listed on your records the next business day following your procedure to check on you and address any questions or concerns that you may have at that time regarding the information given to you following your procedure. This is a courtesy call and so if there is no answer at the home number and we have not heard from you through the emergency physician on call, we will assume that you have returned to your regular daily activities without incident.  SIGNATURES/CONFIDENTIALITY: You and/or your care partner have signed paperwork which will be   entered into your electronic medical record.  These signatures attest to the fact that that the information above on your After Visit Summary has been reviewed and is understood.  Full responsibility of the confidentiality of this discharge information lies with you and/or your care-partner.  

## 2012-02-12 ENCOUNTER — Telehealth: Payer: Self-pay | Admitting: *Deleted

## 2012-02-12 NOTE — Telephone Encounter (Signed)
  Follow up Call-  Call back number 02/11/2012  Post procedure Call Back phone  # [phone]  Permission to leave phone message Yes     Patient questions:  Do you have a fever, pain , or abdominal swelling? no Pain Score  0 *  Have you tolerated food without any problems? yes  Have you been able to return to your normal activities? yes  Do you have any questions about your discharge instructions: Diet   no Medications  no Follow up visit  no  Do you have questions or concerns about your Care? no  Actions: * If pain score is 4 or above: No action needed, pain <4.

## 2012-02-17 ENCOUNTER — Encounter (HOSPITAL_COMMUNITY): Payer: Medicare Other | Attending: Oncology

## 2012-02-17 DIAGNOSIS — F411 Generalized anxiety disorder: Secondary | ICD-10-CM | POA: Diagnosis not present

## 2012-02-17 DIAGNOSIS — J449 Chronic obstructive pulmonary disease, unspecified: Secondary | ICD-10-CM | POA: Insufficient documentation

## 2012-02-17 DIAGNOSIS — C9110 Chronic lymphocytic leukemia of B-cell type not having achieved remission: Secondary | ICD-10-CM | POA: Diagnosis not present

## 2012-02-17 DIAGNOSIS — M19019 Primary osteoarthritis, unspecified shoulder: Secondary | ICD-10-CM | POA: Diagnosis not present

## 2012-02-17 DIAGNOSIS — B259 Cytomegaloviral disease, unspecified: Secondary | ICD-10-CM | POA: Diagnosis not present

## 2012-02-17 DIAGNOSIS — J4489 Other specified chronic obstructive pulmonary disease: Secondary | ICD-10-CM | POA: Insufficient documentation

## 2012-02-17 DIAGNOSIS — F17200 Nicotine dependence, unspecified, uncomplicated: Secondary | ICD-10-CM | POA: Insufficient documentation

## 2012-02-17 DIAGNOSIS — M1710 Unilateral primary osteoarthritis, unspecified knee: Secondary | ICD-10-CM | POA: Diagnosis not present

## 2012-02-17 LAB — COMPREHENSIVE METABOLIC PANEL
ALT: 16 U/L (ref 0–53)
AST: 22 U/L (ref 0–37)
Alkaline Phosphatase: 113 U/L (ref 39–117)
CO2: 28 mEq/L (ref 19–32)
Calcium: 9.5 mg/dL (ref 8.4–10.5)
Chloride: 105 mEq/L (ref 96–112)
GFR calc Af Amer: >90 mL/min (ref >90)
GFR calc non Af Amer: >90 mL/min (ref >90)
Glucose, Bld: 93 mg/dL (ref 70–99)
Potassium: 3.9 mEq/L (ref 3.5–5.1)
Sodium: 142 mEq/L (ref 135–145)
Total Bilirubin: 0.4 mg/dL (ref 0.3–1.2)

## 2012-02-17 LAB — DIFFERENTIAL
Basophils Absolute: 0 10*3/uL (ref 0.0–0.1)
Lymphs Abs: 82.1 10*3/uL — ABNORMAL HIGH (ref 0.7–4.0)
Monocytes Absolute: 1.8 10*3/uL — ABNORMAL HIGH (ref 0.1–1.0)
Monocytes Relative: 2 % — ABNORMAL LOW (ref 3–12)
Neutro Abs: 6.3 10*3/uL (ref 1.7–7.7)

## 2012-02-17 LAB — CBC
Hemoglobin: 15.1 g/dL (ref 13.0–17.0)
MCH: 30.7 pg (ref 26.0–34.0)
Platelets: 185 10*3/uL (ref 150–400)
RBC: 4.92 MIL/uL (ref 4.22–5.81)
WBC: 90.2 10*3/uL (ref 4.0–10.5)

## 2012-02-17 NOTE — Progress Notes (Signed)
Labs drawn today for cbc/diff,cmp 

## 2012-02-17 NOTE — Progress Notes (Signed)
CRITICAL VALUE ALERT Critical value received:  WBC 90.0  Date of notification:  02/17/12 Time of notification: 1335 Critical value read back:  yes Nurse who received alert:  T.Argie Lober,RN  MD notified (1st page):  [phone]

## 2012-02-24 ENCOUNTER — Encounter (HOSPITAL_BASED_OUTPATIENT_CLINIC_OR_DEPARTMENT_OTHER): Payer: Medicare Other | Admitting: Oncology

## 2012-02-24 ENCOUNTER — Encounter (HOSPITAL_COMMUNITY): Payer: Self-pay | Admitting: Oncology

## 2012-02-24 VITALS — BP 137/61 | HR 81 | Temp 97.3°F | Resp 18 | Wt 184.6 lb

## 2012-02-24 DIAGNOSIS — J449 Chronic obstructive pulmonary disease, unspecified: Secondary | ICD-10-CM

## 2012-02-24 DIAGNOSIS — C9110 Chronic lymphocytic leukemia of B-cell type not having achieved remission: Secondary | ICD-10-CM | POA: Diagnosis not present

## 2012-02-24 DIAGNOSIS — F17200 Nicotine dependence, unspecified, uncomplicated: Secondary | ICD-10-CM | POA: Diagnosis not present

## 2012-02-24 DIAGNOSIS — F411 Generalized anxiety disorder: Secondary | ICD-10-CM | POA: Diagnosis not present

## 2012-02-24 NOTE — Progress Notes (Signed)
Problem #1 chronic lymphocytic leukemia with early disease at his presentation in November 2004. He does however have hypogammaglobulinemia. He has an intermittently elevated white count over 100,000. He continues to fluctuate however, and his red cells and platelets are perfect. He does have  One or two 1 cm to 1-1/2 cm lymph nodes in each inguinal area today. He still has no symptomatology. He has no hepatosplenomegaly. And no the adenopathy. Problem #2 COPD still smoking approximately one pack of cigarettes per day. It is down from his peak of 2-3 packs of cigarettes a day. His CT scan done this year shows no evidence of cancer in the lung as a screening procedure. Problem #3 CMV positive antibody titer in the past possibly responsible for weakness and fatigability Problem #4 tick bite August 2004 treated with doxycycline with resolution of his rash although also he may have had post tick bite syndrome causing fatigue and weakness. Problem #5 DJD of the left shoulder, left knee, Problem #6 excessive alcohol use quit in 1994 Problem #7 chronic anxiety on alprazolam  He looks good today and remains asymptomatic. He still has no enlargement of liver or spleen. His lungs are clear. He does have crepitations in both shoulders but worse on the left than the right hip I has tennis elbow the left. His heart shows a grade 1/6 systolic murmur without S3 gallop. He has no rub. He has no leg edema. Abdomen is soft and nontender. The inguinal lymph nodes are small but palpable.  His counts are still quite good with his white count down compared to the last time. We will continue to just watch him. He is not in need of therapeutic intervention presently. I've encouraged him once again to quit smoking.

## 2012-02-24 NOTE — Patient Instructions (Addendum)
[facility]  Discharge Instructions  RECOMMENDATIONS MADE BY THE CONSULTANT AND ANY TEST RESULTS WILL BE SENT TO YOUR REFERRING DOCTOR.   EXAM FINDINGS BY MD TODAY AND SIGNS AND SYMPTOMS TO REPORT TO CLINIC OR PRIMARY MD: Exam good today. A couple of small lymph nodes felt. Nothing to worry about.  INSTRUCTIONS GIVEN AND DISCUSSED: Labs in March and then to see Dr.   Rexene Agent INSTRUCTIONS/FOLLOW-UP: Call us if you find new lumps, experience fevers or night sweats.   I acknowledge that I have been informed and understand all the instructions given to me and received a copy. I do not have any more questions at this time, but understand that I may call the Specialty Clinic at [facility] at [phone] during business hours should I have any further questions or need assistance in obtaining follow-up care.    __________________________________________  _____________  __________ Signature of Patient or Authorized Representative            Date                   Time    __________________________________________ Nurse's Signature

## 2012-03-06 DIAGNOSIS — M719 Bursopathy, unspecified: Secondary | ICD-10-CM | POA: Diagnosis not present

## 2012-03-06 DIAGNOSIS — M67919 Unspecified disorder of synovium and tendon, unspecified shoulder: Secondary | ICD-10-CM | POA: Diagnosis not present

## 2012-03-06 DIAGNOSIS — M7710 Lateral epicondylitis, unspecified elbow: Secondary | ICD-10-CM | POA: Diagnosis not present

## 2012-03-09 DIAGNOSIS — J019 Acute sinusitis, unspecified: Secondary | ICD-10-CM | POA: Diagnosis not present

## 2012-03-09 DIAGNOSIS — F411 Generalized anxiety disorder: Secondary | ICD-10-CM | POA: Diagnosis not present

## 2012-03-09 DIAGNOSIS — E785 Hyperlipidemia, unspecified: Secondary | ICD-10-CM | POA: Diagnosis not present

## 2012-03-12 DIAGNOSIS — M19019 Primary osteoarthritis, unspecified shoulder: Secondary | ICD-10-CM | POA: Diagnosis not present

## 2012-03-17 DIAGNOSIS — M719 Bursopathy, unspecified: Secondary | ICD-10-CM | POA: Diagnosis not present

## 2012-03-17 DIAGNOSIS — M67919 Unspecified disorder of synovium and tendon, unspecified shoulder: Secondary | ICD-10-CM | POA: Diagnosis not present

## 2012-03-27 ENCOUNTER — Ambulatory Visit (INDEPENDENT_AMBULATORY_CARE_PROVIDER_SITE_OTHER): Payer: Medicare Other | Admitting: Cardiology

## 2012-03-27 ENCOUNTER — Encounter: Payer: Self-pay | Admitting: *Deleted

## 2012-03-27 ENCOUNTER — Encounter: Payer: Self-pay | Admitting: Cardiology

## 2012-03-27 VITALS — BP 138/70 | HR 90 | Ht 72.0 in | Wt 185.0 lb

## 2012-03-27 DIAGNOSIS — E785 Hyperlipidemia, unspecified: Secondary | ICD-10-CM

## 2012-03-27 DIAGNOSIS — C9110 Chronic lymphocytic leukemia of B-cell type not having achieved remission: Secondary | ICD-10-CM | POA: Diagnosis not present

## 2012-03-27 DIAGNOSIS — Z0189 Encounter for other specified special examinations: Secondary | ICD-10-CM

## 2012-03-27 DIAGNOSIS — I359 Nonrheumatic aortic valve disorder, unspecified: Secondary | ICD-10-CM

## 2012-03-27 DIAGNOSIS — R079 Chest pain, unspecified: Secondary | ICD-10-CM | POA: Diagnosis not present

## 2012-03-27 DIAGNOSIS — R002 Palpitations: Secondary | ICD-10-CM | POA: Insufficient documentation

## 2012-03-27 DIAGNOSIS — F17200 Nicotine dependence, unspecified, uncomplicated: Secondary | ICD-10-CM

## 2012-03-27 DIAGNOSIS — K219 Gastro-esophageal reflux disease without esophagitis: Secondary | ICD-10-CM | POA: Insufficient documentation

## 2012-03-27 DIAGNOSIS — Z720 Tobacco use: Secondary | ICD-10-CM | POA: Insufficient documentation

## 2012-03-27 DIAGNOSIS — I4510 Unspecified right bundle-branch block: Secondary | ICD-10-CM

## 2012-03-27 NOTE — Assessment & Plan Note (Signed)
Patient has been asymptomatic from a cardiac standpoint with a negative stress nuclear study in 2007 and a normal echocardiogram except for mild aortic valve disease at the same time.  He did not require additional cardiac testing or imaging prior to the planned arthroscopic or open surgical intervention in the left shoulder.

## 2012-03-27 NOTE — Assessment & Plan Note (Addendum)
Presence of conduction system abnormalities indicate a slightly higher likelihood of developing significant heart block and a slightly higher likelihood of underlying cardiac disease including coronary artery disease.  These risks are extremely low, and no preoperative testing is required.

## 2012-03-27 NOTE — Progress Notes (Signed)
Patient ID: Alfred Brown, male   DOB: [birth year], 61 y.o.   MRN: [id number]  HPI: Cardiology assessment kindly requested by Dr. Mckinley Jewel prior to planned orthopaedic surgery.  Mr. Wert has been seen in this office in 2004 and again in 2009 for congenital aortic valve disease and palpitations.  He now requires left shoulder surgery with plans for an arthroscopic procedure if possible.  He denies all recent cardiopulmonary symptoms.  He has had prior right shoulder surgery without complications.  Current Outpatient Prescriptions on File Prior to Visit  Medication Sig Dispense Refill  . acyclovir (ZOVIRAX) 800 MG tablet Take 800 mg by mouth as needed. 5 tablets daily as needed       . albuterol (PROVENTIL) (2.5 MG/3ML) 0.083% nebulizer solution Take 2.5 mg by nebulization every 6 (six) hours as needed.        . ALPRAZolam (XANAX) 1 MG tablet Take 1 mg by mouth 3 (three) times daily as needed.        [name] amoxicillin (AMOXIL) 500 MG tablet Take 2,000 mg by mouth as needed. Prior to any dental procedures       . aspirin 325 MG tablet Take 325 mg by mouth daily.        [name] buPROPion (WELLBUTRIN SR) 150 MG 12 hr tablet Take 150 mg by mouth 2 (two) times daily.        . calcium carbonate (OS-CAL) 600 MG TABS Take 600 mg by mouth daily.        . Diphenhydramine-PE-APAP 12.5-5-325 MG TABS Take 1 tablet by mouth as needed. sinus      . fish oil-omega-3 fatty acids 1000 MG capsule Take 2 g by mouth daily.        . fluticasone (FLONASE) 50 MCG/ACT nasal spray Place 2 sprays into the nose daily.        [name] HYDROcodone-homatropine (HYCODAN) 5-1.5 MG/5ML syrup Take by mouth every 6 (six) hours as needed.      [name] ibuprofen (ADVIL,MOTRIN) 200 MG tablet Take 200 mg by mouth every 6 (six) hours as needed.      . Loratadine 10 MG CAPS Take 10 mg by mouth daily.       . Multiple Vitamin (MULTIVITAMIN) tablet Take 1 tablet by mouth daily.        . naproxen sodium (ANAPROX) 220 MG tablet Take 220 mg by mouth as needed.         . niacin-lovastatin (ADVICOR) 500-20 MG 24 hr tablet Take 1 tablet by mouth at bedtime.        [name] omeprazole (PRILOSEC) 20 MG capsule Take 20 mg by mouth as needed.         No Known Allergies    Past Medical History  Diagnosis Date  . CLL (chronic lymphocytic leukemia)   . COPD (chronic obstructive pulmonary disease)   . CMV (cytomegalovirus)   . Depression with anxiety   . DJD (degenerative joint disease)   . Lipoma     left chest wall  . Right bundle branch block     + left anterior fascicular block  . GERD (gastroesophageal reflux disease)   . Hyperlipidemia   . Palpitations 2004    PVCs; borderline stress nuclear in 2004, negative in 2007; Echo 2007; AV-sclerotic, very mild AI    Past Surgical History  Procedure Date  . Rotator cuff repair 1997    right  . Ganglion cyst excision 1997    left wrist  . Colonoscopy nov  2013    Family History  Problem Relation Age of Onset  . Stroke Mother   . Heart attack Father   . Cancer Sister     colon  . Colon cancer Sister   . Cancer Brother     2 brothers died with lung cancer    History   Social History  . Marital Status: Married    Spouse Name: N/A    Number of Children: N/A  . Years of Education: N/A   Occupational History  . Not on file.   Social History Main Topics  . Smoking status: Current Every Day Smoker -- 1.0 packs/day    Types: Cigarettes  . Smokeless tobacco: Never Used  . Alcohol Use: No  . Drug Use: No  . Sexually Active: Not on file   Other Topics Concern  . Not on file   Social History Narrative  . No narrative on file  ROS:  Patient reports generalized lack of energy, class II dyspnea on exertion, Myalgias, arthralgias, herpes labialis, headaches, depression, anxiety and easy bleeding and bruising.  All other systems reviewed and are negative.  PHYSICAL EXAM: BP 138/70  Pulse 90  Ht 6' (1.829 m)  Wt 83.915 kg (185 lb)  BMI 25.09 kg/m2  General-Well-developed; no acute distress Body  Habitus-proportionate weight and height HEENT-[place]/AT; PERRL; EOM intact; conjunctiva and lids nl Neck-No JVD; Bilateral carotid bruits vs. transmitted murmur Endocrine-No thyromegaly Lungs-Clear lung fields; resonant percussion; normal I-to-E ratio Cardiovascular- normal PMI; normal S1 and S2; grade 2/6 systolic ejection murmur at the right upper sternal border radiating to the carotids; no diastolic murmur Abdomen-BS normal; soft and non-tender without masses or organomegaly Musculoskeletal-No deformities, cyanosis or clubbing Neurologic-Nl cranial nerves; symmetric strength and tone Skin- Warm, no significant lesions Extremities-Nl distal pulses; no edema  EKG:  Normal sinus rhythm; nondiagnostic inferior Q waves; right bundle branch block; right axis deviation. No change when compared to previous tracing performed 02/14/2004 except that QRS voltage has increased in the limb leads.   ASSESSMENT AND PLAN:  [place] Bing, MD 03/27/2012 3:47 PM

## 2012-03-27 NOTE — Patient Instructions (Addendum)
Your physician recommends that you schedule a follow-up appointment in: As needed  STOP Smoking  Letter will be sent to clear for surgery

## 2012-03-27 NOTE — Assessment & Plan Note (Signed)
No measurements of serum lipids available for my review.  If not previously obtained, a lipid profile should be performed.  Patient has no known vascular disease and would only require pharmacologic therapy for moderate-to-marked elevation of total and LDL cholesterol.

## 2012-03-27 NOTE — Assessment & Plan Note (Signed)
Aortic valve initially characterized as bicuspid in 2004; subsequent echocardiogram in 2008 did not verify that impression, but he does appear to have a congenitally abnormal valve with mild insufficiency and no stenosis.  In the absence of more compelling disease at age 61, I doubt that this will ever represent a significant problem for him.  It has no impact on the planned orthopaedic surgery.

## 2012-03-27 NOTE — Assessment & Plan Note (Addendum)
CLL is managed by Dr. Mariel Sleet, but has generally not required any treatment.  On recent CBC, white count was 90,000.  I will seek his opinion regarding any precautions to take in conjunction with surgery.  03/31/12: Verified with Dr. Mariel Sleet that no precautions or modification of medical regime are required prior to orthopaedic surgery.

## 2012-03-27 NOTE — Progress Notes (Deleted)
Name: Alfred Brown    DOB: 02/18/1951  Age: 61 y.o.  MR#: [id number]       PCP:  Cassell Smiles., MD      Insurance: @PAYORNAME @   CC:   No chief complaint on file.  1 - MEDICATION LIST 2 - SURGICAL CLEARANCE PER DANIEL MURPHY  - ROTATOR CUFF AND ARTHROPLASTY 3 - BROUGHT OLD RECORDS - WAS A PREVIOUS PT OF DR HARDIN.  VS BP 138/70  Pulse 90  Ht 6' (1.829 m)  Wt 185 lb (83.915 kg)  BMI 25.09 kg/m2  Weights Current Weight  03/27/12 185 lb (83.915 kg)  02/24/12 184 lb 9.6 oz (83.734 kg)  02/11/12 185 lb (83.915 kg)    Blood Pressure  BP Readings from Last 3 Encounters:  03/27/12 138/70  02/24/12 137/61  02/11/12 129/72     Admit date:  (Not on file) Last encounter with RMR:  Visit date not found   Allergy No Known Allergies  Current Outpatient Prescriptions  Medication Sig Dispense Refill  . acyclovir (ZOVIRAX) 800 MG tablet Take 800 mg by mouth as needed. 5 tablets daily as needed       . albuterol (PROVENTIL) (2.5 MG/3ML) 0.083% nebulizer solution Take 2.5 mg by nebulization every 6 (six) hours as needed.        . ALPRAZolam (XANAX) 1 MG tablet Take 1 mg by mouth 3 (three) times daily as needed.        [name] amoxicillin (AMOXIL) 500 MG tablet Take 2,000 mg by mouth as needed. Prior to any dental procedures       . aspirin 325 MG tablet Take 325 mg by mouth daily.        [name] buPROPion (WELLBUTRIN SR) 150 MG 12 hr tablet Take 150 mg by mouth 2 (two) times daily.        . calcium carbonate (OS-CAL) 600 MG TABS Take 600 mg by mouth daily.        . Diphenhydramine-PE-APAP 12.5-5-325 MG TABS Take 1 tablet by mouth as needed. sinus      . fish oil-omega-3 fatty acids 1000 MG capsule Take 2 g by mouth daily.        . fluticasone (FLONASE) 50 MCG/ACT nasal spray Place 2 sprays into the nose daily.        [name] HYDROcodone-homatropine (HYCODAN) 5-1.5 MG/5ML syrup Take by mouth every 6 (six) hours as needed.      [name] ibuprofen (ADVIL,MOTRIN) 200 MG tablet Take 200 mg by mouth every 6 (six)  hours as needed.      . Loratadine 10 MG CAPS Take 10 mg by mouth daily.       . Multiple Vitamin (MULTIVITAMIN) tablet Take 1 tablet by mouth daily.        . naproxen sodium (ANAPROX) 220 MG tablet Take 220 mg by mouth as needed.        . niacin-lovastatin (ADVICOR) 500-20 MG 24 hr tablet Take 1 tablet by mouth at bedtime.        [name] omeprazole (PRILOSEC) 20 MG capsule Take 20 mg by mouth as needed.          Discontinued Meds:   There are no discontinued medications.  Patient Active Problem List  Diagnosis  . GERD (gastroesophageal reflux disease)  . Hyperlipidemia  . Palpitations  . Chronic lymphocytic leukemia    LABS Infusion on 02/17/2012  Component Date Value  . WBC 02/17/2012 90.2*  . RBC 02/17/2012 4.92   . Hemoglobin 02/17/2012  15.1   . HCT 02/17/2012 45.3   . MCV 02/17/2012 92.1   . [facility] 02/17/2012 30.7   . MCHC 02/17/2012 33.3   . RDW 02/17/2012 13.2   . Platelets 02/17/2012 185   . Sodium 02/17/2012 142   . Potassium 02/17/2012 3.9   . Chloride 02/17/2012 105   . CO2 02/17/2012 28   . Glucose, Bld 02/17/2012 93   . BUN 02/17/2012 17   . Creatinine, Ser 02/17/2012 0.91   . Calcium 02/17/2012 9.5   . Total Protein 02/17/2012 6.6   . Albumin 02/17/2012 4.3   . AST 02/17/2012 22   . ALT 02/17/2012 16   . Alkaline Phosphatase 02/17/2012 113   . Total Bilirubin 02/17/2012 0.4   . GFR calc non Af Amer 02/17/2012 >90   . GFR calc Af Amer 02/17/2012 >90   . Neutrophils Relative 02/17/2012 7*  . Lymphocytes Relative 02/17/2012 91*  . Monocytes Relative 02/17/2012 2*  . Eosinophils Relative 02/17/2012 0   . Basophils Relative 02/17/2012 0   . Neutro Abs 02/17/2012 6.3   . Lymphs Abs 02/17/2012 82.1*  . Monocytes Absolute 02/17/2012 1.8*  . Eosinophils Absolute 02/17/2012 0.0   . Basophils Absolute 02/17/2012 0.0   . WBC Morphology 02/17/2012 ATYPICAL LYMPHOCYTES      Results for this Opt Visit:     Results for orders placed in visit on 02/17/12  CBC       Component Value Range   WBC 90.2 (*) 4.0 - 10.5 K/uL   RBC 4.92  4.22 - 5.81 MIL/uL   Hemoglobin 15.1  13.0 - 17.0 g/dL   HCT 16.1  09.6 - 04.5 %   MCV 92.1  78.0 - 100.0 fL   MCH 30.7  26.0 - 34.0 pg   MCHC 33.3  30.0 - 36.0 g/dL   RDW 40.9  81.1 - 91.4 %   Platelets 185  150 - 400 K/uL  COMPREHENSIVE METABOLIC PANEL      Component Value Range   Sodium 142  135 - 145 mEq/L   Potassium 3.9  3.5 - 5.1 mEq/L   Chloride 105  96 - 112 mEq/L   CO2 28  19 - 32 mEq/L   Glucose, Bld 93  70 - 99 mg/dL   BUN 17  6 - 23 mg/dL   Creatinine, Ser 7.82  0.50 - 1.35 mg/dL   Calcium 9.5  8.4 - 95.6 mg/dL   Total Protein 6.6  6.0 - 8.3 g/dL   Albumin 4.3  3.5 - 5.2 g/dL   AST 22  0 - 37 U/L   ALT 16  0 - 53 U/L   Alkaline Phosphatase 113  39 - 117 U/L   Total Bilirubin 0.4  0.3 - 1.2 mg/dL   GFR calc non Af Amer >90  >90 mL/min   GFR calc Af Amer >90  >90 mL/min  DIFFERENTIAL      Component Value Range   Neutrophils Relative 7 (*) 43 - 77 %   Lymphocytes Relative 91 (*) 12 - 46 %   Monocytes Relative 2 (*) 3 - 12 %   Eosinophils Relative 0  0 - 5 %   Basophils Relative 0  0 - 1 %   Neutro Abs 6.3  1.7 - 7.7 K/uL   Lymphs Abs 82.1 (*) 0.7 - 4.0 K/uL   Monocytes Absolute 1.8 (*) 0.1 - 1.0 K/uL   Eosinophils Absolute 0.0  0.0 - 0.7 K/uL   Basophils Absolute  0.0  0.0 - 0.1 K/uL   WBC Morphology ATYPICAL LYMPHOCYTES      EKG Orders placed in visit on 03/27/12  . EKG 12-LEAD     Prior Assessment and Plan Problem List as of 03/27/2012            Cardiology Problems   Hyperlipidemia     Other   GERD (gastroesophageal reflux disease)   Palpitations   Chronic lymphocytic leukemia       Imaging: No results found.   FRS Calculation: Score not calculated. Missing: Total Cholesterol, HDL

## 2012-03-31 ENCOUNTER — Encounter: Payer: Self-pay | Admitting: Cardiology

## 2012-04-06 ENCOUNTER — Encounter (HOSPITAL_BASED_OUTPATIENT_CLINIC_OR_DEPARTMENT_OTHER): Payer: Self-pay | Admitting: *Deleted

## 2012-04-06 ENCOUNTER — Telehealth (HOSPITAL_COMMUNITY): Payer: Self-pay

## 2012-04-06 NOTE — Telephone Encounter (Signed)
Message left on patient's voicemail that Dr. Mariel Sleet will be talking with his surgeon.

## 2012-04-06 NOTE — Telephone Encounter (Signed)
Call from Mrs. Roger.  Mr. Coster is scheduled for shoulder surgery on Thursday and wants to know if it's ok for him to have this surgery.  They are waiting for clearance from [place].

## 2012-04-06 NOTE — Progress Notes (Signed)
Has cardiac clearance from dr Dietrich Pates Will bring all meds and overnight bag just in case he needs to stay

## 2012-04-08 ENCOUNTER — Telehealth (HOSPITAL_COMMUNITY): Payer: Self-pay

## 2012-04-08 NOTE — Telephone Encounter (Signed)
Error

## 2012-04-08 NOTE — Telephone Encounter (Signed)
Alfred Brown notified that Dr. Mariel Sleet spoke with Dr. Eulah Pont.  Alfred Brown stated that the aspirin was stopped.

## 2012-04-08 NOTE — H&P (Signed)
[facility] [address]   [address] [place], [place] [address] [phone] A Division of [facility]  Loreta Ave, M.D.   Robert A. Thurston Hole, M.D.   Burnell Blanks, M.D.   Eulas Post, M.D.   Lunette Stands, M.D Buford Dresser, M.D.  Charlsie Quest, M.D.   Estell Harpin, M.D.   Melina Fiddler, M.D. Genene Churn. Barry Dienes, PA-C            Kirstin A. Shepperson, PA-C Josh [place], PA-C [place], [place]   RE: Joson, Sapp                                [phone]      DOB: [birth year] INITIAL EVALUATION:  03-06-12 Chief complaint: Left shoulder pain.  History of present illness: 62 year-old white male who is a new patient to the office, presents with the above complaint.  States that shoulder pain ongoing for several years, but worse over the last couple of months.  No specific injury that he can recall.  No cervical spine or radicular component.  Left shoulder pain aggravated with overhead activity and reaching behind his back.  Also complaining of left elbow pain.  He said that he has had previous history of tennis elbow, but cannot recall which elbow this was.  Pain localized to the lateral epicondyle and proximal forearm.  Some radiation down towards his wrist.  No complaints of numbness or tingling.  He said that he has been doing some of the wrist exercises that were given several years ago and this has possibly been aggravating his forearm symptoms.   Current medications: Aspirin, Wellbutrin, Advair, Acyclovir, Xanax, Loratadine, Omeprazole, Amoxil, ProAir and Flonase. Allergies: No known drug allergies. Past medical/surgical history: COPD, depression, chronic lymphocytic leukemia in 2004, bicuspid aortic valve right bundle branch block and right shoulder rotator cuff repair in 1997. Review of systems: All other systems are unremarkable.   Family history: Positive for hypertension, heart disease and  arthritis. Social history: Admits smoking one pack per day.  Denies alcohol use.  Patient is married and currently on disability.      EXAMINATION: Height: 6?0.  Weight: 185 pounds.  Pleasant white male, alert and oriented x 3 and in no acute distress.  No increase in respiratory effort.  Cervical spine unremarkable.  Left shoulder active flexion/abduction to about 90-95 degrees with marked discomfort.  Positive drop arm.  He has full passive motion.  Pain and weakness with supraspinatus resistance.  Positive impingement test.  Left elbow good range of motion.  Negative Tinel's over the cubital tunnel.  Negative elbow flexion test.  With elbow range of motion he can feel his ulnar nerve subluxing out of the groove, but does not have any discomfort with this.  He is mildly tender over the lateral epicondyle.  No swelling or bruising.  He has left greater than right tenderness over the radial tunnels.  Neurovascularly intact.  Skin warm and dry.  Negative Tinel's and Phalen's bilateral wrists.      X-RAYS: Left shoulder, AP, outlet and axillary views, show a Type II acromion with AC degenerative changes.  Glenohumeral joint looks pretty good.  Left elbow, AP, lateral and oblique views, show good bony anatomy.    IMPRESSION: 1. Left shoulder pain secondary to impingement and possible rotator cuff tear.   2. Left elbow lateral epicondylitis and radial tunnel  syndrome.  Continued  [facility] [address]   [address] [place], [place] [address] [phone] A Division of [facility]  Loreta Ave, M.D.   Robert A. Thurston Hole, M.D.   Burnell Blanks, M.D.   Eulas Post, M.D.   Lunette Stands, M.D Buford Dresser, M.D.  Charlsie Quest, M.D.   Estell Harpin, M.D.   Melina Fiddler, M.D. Genene Churn. Barry Dienes, PA-C            Kirstin A. Shepperson, PA-C Josh [place], PA-C [place], [place]   RE: Ava, Deguire                                 [phone]      DOB: Mar 10, 1951 PROGRESS NOTE: 03-06-12 PLAN: Scheduled an MRI of the left shoulder to rule out rotator cuff tear.  He will follow up after completion to discuss results.  We offered conservative treatment for his left tennis elbow with injection.  Again shown stretching exercises.  He will pay attention to how his forearm symptoms are.  We did discuss further workup with MRI and/or NCV/EMG study depending upon response to injection today.  Advised not to use a tennis elbow strap.  All questions answered.    PROCEDURE NOTE: The patient's clinical condition is marked by substantial pain and/or significant functional disability.  Other conservative therapy has not provided relief, is contraindicated, or not appropriate.  There is a reasonable likelihood that injection will significantly improve the patient's pain and/or functional disability. After patient consent the left lateral elbow was prepped with Betadine and then tennis elbow 1: Depo-Medrol/Marcaine injection performed.  Tolerated the procedure without complication.   Loreta Ave, M.D.   Electronically verified by Loreta Ave, M.D. DFM(JMO):jjh D 03-09-12 T 03-10-12  [facility] [address]   [address] [place], [place] [address] [phone] A Division of [facility]  Loreta Ave, M.D.   Robert A. Thurston Hole, M.D.   Burnell Blanks, M.D.   Eulas Post, M.D.   Lunette Stands, M.D Buford Dresser, M.D.  Charlsie Quest, M.D.   Estell Harpin, M.D.   Melina Fiddler, M.D. Genene Churn. Barry Dienes, PA-C            Kirstin A. Shepperson, PA-C Josh [place], PA-C [place], [place]  RE: Kian, Ottaviano   [phone]      DOB: [birth year] PROGRESS NOTE: 03-17-12 Nehemyah comes in for follow-up. Status post left elbow injection no pain much better doing his home exercise program.  Persistent significant symptoms left shoulder. This is long-standing  a couple years. MRI complete that I reviewed. Picture of significant impingement especially the [facility] joint with marked encroachment on the outlet from that. Distal supraspinatus tendinosis prominent cystic change tuberosity but there's no full thickness tear identified. No muscle atrophy. Mild bicipital tendinosis. I went over the scan report and scan itself and showed it to Gonzales and his wife.  EXAMINATION: I get through full motion but he continues to have significant irritation on his rotator cuff. Give way weakness.   DISPOSITION: In light of the continued marked symptoms going on for a couple years I don't think injection and more physical therapy is going to help this and he understands. Discussed exam under anesthesia arthroscopy decompression. Adding biceps release and/or rotator cuff repair if that's found. He's been  through this procedure on the right in 1997 by me where I ended up doing a mini open rotator cuff repair with excellent results from that.   More than 25 minutes spent face-to-face covering this with him and his wife and they both understand and agree. All questions answered. Paperwork complete. Discussed risks benefits and possible complications in detail. We'll set this up in the near future. We'll need cardiology clearance from [place] because of aortic valve artery issue.  Loreta Ave, M.D.  Electronically verified by Loreta Ave, M.D. DFM:kh D 03-17-12 T 03-18-12

## 2012-04-09 ENCOUNTER — Ambulatory Visit (HOSPITAL_BASED_OUTPATIENT_CLINIC_OR_DEPARTMENT_OTHER)
Admission: RE | Admit: 2012-04-09 | Discharge: 2012-04-09 | Disposition: A | Discharge status: Home / Self Care | Payer: Medicare Other | Source: Ambulatory Visit | Attending: Orthopedic Surgery | Admitting: Orthopedic Surgery

## 2012-04-09 ENCOUNTER — Encounter (HOSPITAL_BASED_OUTPATIENT_CLINIC_OR_DEPARTMENT_OTHER)
Admission: RE | Disposition: A | Discharge status: Home / Self Care | Payer: Self-pay | Source: Ambulatory Visit | Attending: Orthopedic Surgery

## 2012-04-09 ENCOUNTER — Encounter (HOSPITAL_BASED_OUTPATIENT_CLINIC_OR_DEPARTMENT_OTHER): Payer: Self-pay

## 2012-04-09 ENCOUNTER — Encounter (HOSPITAL_BASED_OUTPATIENT_CLINIC_OR_DEPARTMENT_OTHER): Payer: Self-pay | Admitting: Anesthesiology

## 2012-04-09 ENCOUNTER — Ambulatory Visit (HOSPITAL_BASED_OUTPATIENT_CLINIC_OR_DEPARTMENT_OTHER): Payer: Medicare Other | Admitting: Anesthesiology

## 2012-04-09 DIAGNOSIS — I4510 Unspecified right bundle-branch block: Secondary | ICD-10-CM | POA: Insufficient documentation

## 2012-04-09 DIAGNOSIS — F17200 Nicotine dependence, unspecified, uncomplicated: Secondary | ICD-10-CM | POA: Insufficient documentation

## 2012-04-09 DIAGNOSIS — M19019 Primary osteoarthritis, unspecified shoulder: Secondary | ICD-10-CM | POA: Insufficient documentation

## 2012-04-09 DIAGNOSIS — J4489 Other specified chronic obstructive pulmonary disease: Secondary | ICD-10-CM | POA: Insufficient documentation

## 2012-04-09 DIAGNOSIS — M67919 Unspecified disorder of synovium and tendon, unspecified shoulder: Secondary | ICD-10-CM | POA: Insufficient documentation

## 2012-04-09 DIAGNOSIS — K219 Gastro-esophageal reflux disease without esophagitis: Secondary | ICD-10-CM | POA: Insufficient documentation

## 2012-04-09 DIAGNOSIS — M75120 Complete rotator cuff tear or rupture of unspecified shoulder, not specified as traumatic: Secondary | ICD-10-CM | POA: Diagnosis not present

## 2012-04-09 DIAGNOSIS — J449 Chronic obstructive pulmonary disease, unspecified: Secondary | ICD-10-CM | POA: Diagnosis not present

## 2012-04-09 DIAGNOSIS — F3289 Other specified depressive episodes: Secondary | ICD-10-CM | POA: Insufficient documentation

## 2012-04-09 DIAGNOSIS — M25819 Other specified joint disorders, unspecified shoulder: Secondary | ICD-10-CM | POA: Insufficient documentation

## 2012-04-09 DIAGNOSIS — F329 Major depressive disorder, single episode, unspecified: Secondary | ICD-10-CM | POA: Diagnosis not present

## 2012-04-09 DIAGNOSIS — M25519 Pain in unspecified shoulder: Secondary | ICD-10-CM | POA: Diagnosis not present

## 2012-04-09 DIAGNOSIS — G8918 Other acute postprocedural pain: Secondary | ICD-10-CM | POA: Diagnosis not present

## 2012-04-09 DIAGNOSIS — Z9889 Other specified postprocedural states: Secondary | ICD-10-CM

## 2012-04-09 DIAGNOSIS — M66329 Spontaneous rupture of flexor tendons, unspecified upper arm: Secondary | ICD-10-CM | POA: Diagnosis not present

## 2012-04-09 DIAGNOSIS — I498 Other specified cardiac arrhythmias: Secondary | ICD-10-CM | POA: Diagnosis not present

## 2012-04-09 DIAGNOSIS — Q231 Congenital insufficiency of aortic valve: Secondary | ICD-10-CM | POA: Insufficient documentation

## 2012-04-09 DIAGNOSIS — M719 Bursopathy, unspecified: Secondary | ICD-10-CM | POA: Insufficient documentation

## 2012-04-09 HISTORY — PX: SHOULDER ARTHROSCOPY WITH SUBACROMIAL DECOMPRESSION: SHX5684

## 2012-04-09 SURGERY — SHOULDER ARTHROSCOPY WITH SUBACROMIAL DECOMPRESSION
Anesthesia: General | Site: Shoulder | Laterality: Left

## 2012-04-09 MED ORDER — CEFAZOLIN SODIUM-DEXTROSE 2-3 GM-% IV SOLR
2.0000 g | INTRAVENOUS | Status: AC
  Administered 2012-04-09: 2 g via INTRAVENOUS

## 2012-04-09 MED ORDER — PROMETHAZINE HCL 25 MG/ML IJ SOLN: 6.2500 mg | INTRAMUSCULAR | Status: DC | PRN

## 2012-04-09 MED ORDER — FENTANYL CITRATE 0.05 MG/ML IJ SOLN: 50.0000 ug | Freq: Once | INTRAMUSCULAR | Status: DC

## 2012-04-09 MED ORDER — DEXAMETHASONE SODIUM PHOSPHATE 4 MG/ML IJ SOLN
INTRAMUSCULAR | Status: DC | PRN
  Administered 2012-04-09: 4 mg

## 2012-04-09 MED ORDER — HYDROMORPHONE HCL PF 1 MG/ML IJ SOLN: 0.2500 mg | INTRAMUSCULAR | Status: DC | PRN

## 2012-04-09 MED ORDER — BUPIVACAINE-EPINEPHRINE PF 0.5-1:200000 % IJ SOLN
INTRAMUSCULAR | Status: DC | PRN
  Administered 2012-04-09: 25 mL

## 2012-04-09 MED ORDER — SUCCINYLCHOLINE CHLORIDE 20 MG/ML IJ SOLN
INTRAMUSCULAR | Status: DC | PRN
  Administered 2012-04-09: 100 mg via INTRAVENOUS

## 2012-04-09 MED ORDER — PROPOFOL 10 MG/ML IV BOLUS
INTRAVENOUS | Status: DC | PRN
  Administered 2012-04-09: 200 mg via INTRAVENOUS

## 2012-04-09 MED ORDER — DEXAMETHASONE SODIUM PHOSPHATE 4 MG/ML IJ SOLN
INTRAMUSCULAR | Status: DC | PRN
  Administered 2012-04-09: 10 mg via INTRAVENOUS

## 2012-04-09 MED ORDER — MIDAZOLAM HCL 2 MG/2ML IJ SOLN
1.0000 mg | INTRAMUSCULAR | Status: DC | PRN
  Administered 2012-04-09: 2 mg via INTRAVENOUS

## 2012-04-09 MED ORDER — MIDAZOLAM HCL 2 MG/2ML IJ SOLN: 1.0000 mg | INTRAMUSCULAR | Status: DC | PRN

## 2012-04-09 MED ORDER — OXYCODONE HCL 5 MG/5ML PO SOLN
5.0000 mg | Freq: Once | ORAL | Status: DC | PRN
Start: 2012-04-09 — End: 2012-04-09

## 2012-04-09 MED ORDER — LIDOCAINE HCL (CARDIAC) 20 MG/ML IV SOLN
INTRAVENOUS | Status: DC | PRN
  Administered 2012-04-09: 40 mg via INTRAVENOUS

## 2012-04-09 MED ORDER — SODIUM CHLORIDE 0.9 % IR SOLN
Status: DC | PRN
  Administered 2012-04-09: 3000 mL

## 2012-04-09 MED ORDER — ONDANSETRON HCL 4 MG/2ML IJ SOLN
INTRAMUSCULAR | Status: DC | PRN
  Administered 2012-04-09: 4 mg via INTRAVENOUS

## 2012-04-09 MED ORDER — FENTANYL CITRATE 0.05 MG/ML IJ SOLN
50.0000 ug | Freq: Once | INTRAMUSCULAR | Status: AC
  Administered 2012-04-09: 100 ug via INTRAVENOUS

## 2012-04-09 MED ORDER — ACETAMINOPHEN 10 MG/ML IV SOLN
1000.0000 mg | Freq: Once | INTRAVENOUS | Status: AC
  Administered 2012-04-09: 1000 mg via INTRAVENOUS

## 2012-04-09 MED ORDER — LACTATED RINGERS IV SOLN
INTRAVENOUS | Status: DC
  Administered 2012-04-09: 07:00:00 via INTRAVENOUS

## 2012-04-09 MED ORDER — OXYCODONE HCL 5 MG PO TABS: 5.0000 mg | ORAL_TABLET | Freq: Once | ORAL | Status: DC | PRN

## 2012-04-09 SURGICAL SUPPLY — 70 items
ANCHOR PEEK SWIVEL LOCK 5.5 (Anchor) ×4 IMPLANT
BENZOIN TINCTURE PRP APPL 2/3 (GAUZE/BANDAGES/DRESSINGS) IMPLANT
BLADE CUTTER GATOR 3.5 (BLADE) ×2 IMPLANT
BLADE CUTTER MENIS 5.5 (BLADE) IMPLANT
BLADE GREAT WHITE 4.2 (BLADE) ×2 IMPLANT
BLADE SURG 15 STRL LF DISP TIS (BLADE) IMPLANT
BLADE SURG 15 STRL SS (BLADE)
BUR OVAL 6.0 (BURR) ×2 IMPLANT
CANISTER OMNI JUG 16 LITER (MISCELLANEOUS) ×2 IMPLANT
CANISTER SUCTION 2500CC (MISCELLANEOUS) IMPLANT
CANNULA DRY DOC 8X75 (CANNULA) IMPLANT
CANNULA TWIST IN 8.25X7CM (CANNULA) IMPLANT
CLOTH BEACON ORANGE TIMEOUT ST (SAFETY) ×2 IMPLANT
DECANTER SPIKE VIAL GLASS SM (MISCELLANEOUS) IMPLANT
DRAPE STERI 35X30 U-POUCH (DRAPES) ×2 IMPLANT
DRAPE U-SHAPE 47X51 STRL (DRAPES) ×2 IMPLANT
DRAPE U-SHAPE 76X120 STRL (DRAPES) ×4 IMPLANT
DRSG PAD ABDOMINAL 8X10 ST (GAUZE/BANDAGES/DRESSINGS) ×2 IMPLANT
DURAPREP 26ML APPLICATOR (WOUND CARE) ×2 IMPLANT
ELECT MENISCUS 165MM 90D (ELECTRODE) ×2 IMPLANT
ELECT NEEDLE TIP 2.8 STRL (NEEDLE) IMPLANT
ELECT REM PT RETURN 9FT ADLT (ELECTROSURGICAL) ×2
ELECTRODE REM PT RTRN 9FT ADLT (ELECTROSURGICAL) ×1 IMPLANT
GAUZE XEROFORM 1X8 LF (GAUZE/BANDAGES/DRESSINGS) ×2 IMPLANT
GLOVE BIO SURGEON STRL SZ 6.5 (GLOVE) ×2 IMPLANT
GLOVE BIOGEL PI IND STRL 7.0 (GLOVE) ×1 IMPLANT
GLOVE BIOGEL PI IND STRL 8 (GLOVE) ×1 IMPLANT
GLOVE BIOGEL PI INDICATOR 7.0 (GLOVE) ×1
GLOVE BIOGEL PI INDICATOR 8 (GLOVE) ×1
GLOVE ORTHO TXT STRL SZ7.5 (GLOVE) ×4 IMPLANT
GOWN PREVENTION PLUS XLARGE (GOWN DISPOSABLE) ×4 IMPLANT
GOWN STRL REIN 2XL XLG LVL4 (GOWN DISPOSABLE) ×2 IMPLANT
IV NS IRRIG 3000ML ARTHROMATIC (IV SOLUTION) ×8 IMPLANT
NDL SUT 6 .5 CRC .975X.05 MAYO (NEEDLE) IMPLANT
NEEDLE MAYO TAPER (NEEDLE)
NEEDLE SCORPION MULTI FIRE (NEEDLE) IMPLANT
NS IRRIG 1000ML POUR BTL (IV SOLUTION) IMPLANT
PACK ARTHROSCOPY DSU (CUSTOM PROCEDURE TRAY) ×2 IMPLANT
PACK BASIN DAY SURGERY FS (CUSTOM PROCEDURE TRAY) ×2 IMPLANT
PASSER SUT SWANSON 36MM LOOP (INSTRUMENTS) IMPLANT
PENCIL BUTTON HOLSTER BLD 10FT (ELECTRODE) ×2 IMPLANT
SET ARTHROSCOPY TUBING (MISCELLANEOUS) ×1
SET ARTHROSCOPY TUBING LN (MISCELLANEOUS) ×1 IMPLANT
SLEEVE SCD COMPRESS KNEE MED (MISCELLANEOUS) IMPLANT
SLING ARM FOAM STRAP LRG (SOFTGOODS) IMPLANT
SLING ARM FOAM STRAP MED (SOFTGOODS) IMPLANT
SLING ARM FOAM STRAP XLG (SOFTGOODS) IMPLANT
SLING ARM IMMOBILIZER LRG (SOFTGOODS) IMPLANT
SLING ARM IMMOBILIZER MED (SOFTGOODS) IMPLANT
SPONGE GAUZE 4X4 12PLY (GAUZE/BANDAGES/DRESSINGS) ×4 IMPLANT
SPONGE LAP 4X18 X RAY DECT (DISPOSABLE) IMPLANT
STRIP CLOSURE SKIN 1/2X4 (GAUZE/BANDAGES/DRESSINGS) IMPLANT
SUCTION FRAZIER TIP 10 FR DISP (SUCTIONS) IMPLANT
SUT ETHIBOND 2 OS 4 DA (SUTURE) IMPLANT
SUT ETHILON 2 0 FS 18 (SUTURE) IMPLANT
SUT ETHILON 3 0 PS 1 (SUTURE) IMPLANT
SUT FIBERWIRE #2 38 T-5 BLUE (SUTURE)
SUT RETRIEVER MED (INSTRUMENTS) IMPLANT
SUT TIGER TAPE 7 IN WHITE (SUTURE) IMPLANT
SUT VIC AB 0 CT1 27 (SUTURE)
SUT VIC AB 0 CT1 27XBRD ANBCTR (SUTURE) IMPLANT
SUT VIC AB 2-0 SH 27 (SUTURE)
SUT VIC AB 2-0 SH 27XBRD (SUTURE) IMPLANT
SUT VIC AB 3-0 FS2 27 (SUTURE) IMPLANT
SUTURE FIBERWR #2 38 T-5 BLUE (SUTURE) IMPLANT
TAPE FIBER 2MM 7IN #2 BLUE (SUTURE) IMPLANT
TOWEL OR 17X24 6PK STRL BLUE (TOWEL DISPOSABLE) ×2 IMPLANT
TOWEL OR NON WOVEN STRL DISP B (DISPOSABLE) ×2 IMPLANT
WATER STERILE IRR 1000ML POUR (IV SOLUTION) ×2 IMPLANT
YANKAUER SUCT BULB TIP NO VENT (SUCTIONS) IMPLANT

## 2012-04-09 NOTE — Progress Notes (Signed)
Assisted Dr. Kasik with left, ultrasound guided, interscalene  block. Side rails up, monitors on throughout procedure. See vital signs in flow sheet. Tolerated Procedure well.  

## 2012-04-09 NOTE — Interval H&P Note (Signed)
History and Physical Interval Note:  04/09/2012 7:32 AM  Alfred Brown  has presented today for surgery, with the diagnosis of LEFT SHOULDER IMPINGEMENT SYNDROME, DEGENERATIVE ARTHRITIS, A/C JOINT, COMPLETE RUPTURE OF ROTATOR CUFF  The various methods of treatment have been discussed with the patient and family. After consideration of risks, benefits and other options for treatment, the patient has consented to  Procedure(s) (LRB) with comments: SHOULDER ARTHROSCOPY WITH SUBACROMIAL DECOMPRESSION (Left) - LEFT SHOULDER ARTHROSCOPY, SUBACROMIAL DECOMPRESSION, PARTIAL ACROMIOPLASTY WITH CORACOACROMIAL RELEASE, DISTAL CLAVICULECTOMY WITH ROTATOR CUFF REPAIR as a surgical intervention .  The patient's history has been reviewed, patient examined, no change in status, stable for surgery.  I have reviewed the patient's chart and labs.  Questions were answered to the patient's satisfaction.     Rodric Punch F

## 2012-04-09 NOTE — Anesthesia Postprocedure Evaluation (Signed)
  Anesthesia Post-op Note  Patient: Alfred Brown  Procedure(s) Performed: Procedure(s) (LRB) with comments: SHOULDER ARTHROSCOPY WITH SUBACROMIAL DECOMPRESSION (Left) - LEFT SHOULDER ARTHROSCOPY, SUBACROMIAL DECOMPRESSION, PARTIAL ACROMIOPLASTY WITH CORACOACROMIAL RELEASE, DISTAL CLAVICULECTOMY WITH ROTATOR CUFF REPAIR, DEBRIDEMENT OF LABRUM  Patient Location: PACU  Anesthesia Type:GA combined with regional for post-op pain  Level of Consciousness: awake and alert   Airway and Oxygen Therapy: Patient Spontanous Breathing  Post-op Pain: none  Post-op Assessment: Post-op Vital signs reviewed, Patient's Cardiovascular Status Stable, Respiratory Function Stable, Patent Airway, No signs of Nausea or vomiting, Adequate PO intake and Pain level controlled  Post-op Vital Signs: stable  Complications: No apparent anesthesia complications

## 2012-04-09 NOTE — Anesthesia Procedure Notes (Addendum)
Anesthesia Regional Block:  Interscalene brachial plexus block  Pre-Anesthetic Checklist: ,, timeout performed, Correct Patient, Correct Site, Correct Laterality, Correct Procedure, Correct Position, site marked, Risks and benefits discussed,  Surgical consent,  Pre-op evaluation,  At surgeon's request and post-op pain management  Laterality: Left  Prep: chloraprep       Needles:  Injection technique: Single-shot  Needle Type: Echogenic Stimulator Needle     Needle Length: 5cm 5 cm Needle Gauge: 22 and 22 G    Additional Needles:  Procedures: ultrasound guided (picture in chart) and nerve stimulator Interscalene brachial plexus block  Nerve Stimulator or Paresthesia:  Response: 0.6 mA,   Additional Responses:   Narrative:  Start time: 04/09/2012 7:01 AM End time: 04/09/2012 7:09 AM Injection made incrementally with aspirations every 5 mL. Anesthesiologist: Dr Gypsy Balsam  Additional Notes: [phone] L ISB POP CHG prep, sterile tech US guidance w/PIX in chart, nerve stim set at .6ma Multiple neg asp Vernia Buff .5% w/epi 1:200000 total 25cc+decadron 4mg  infil No compl Dr Gypsy Balsam   Procedure Name: Intubation Performed by: York Grice Pre-anesthesia Checklist: Patient identified, Timeout performed, Emergency Drugs available, Suction available and Patient being monitored Patient Re-evaluated:Patient Re-evaluated prior to inductionOxygen Delivery Method: Circle system utilized Preoxygenation: Pre-oxygenation with 100% oxygen Intubation Type: IV induction Ventilation: Mask ventilation without difficulty Laryngoscope Size: Miller and 2 Grade View: Grade I Tube type: Oral Tube size: 8.0 mm Number of attempts: 1 Airway Equipment and Method: Stylet Placement Confirmation: ETT inserted through vocal cords under direct vision,  breath sounds checked- equal and bilateral and positive ETCO2 Secured at: 23 cm Tube secured with: Tape Dental Injury: Teeth and Oropharynx as per  pre-operative assessment

## 2012-04-09 NOTE — Brief Op Note (Signed)
04/09/2012  9:07 AM  PATIENT:  Alfred Brown  62 y.o. male  PRE-OPERATIVE DIAGNOSIS:  LEFT SHOULDER IMPINGEMENT SYNDROME, DEGENERATIVE ARTHRITIS, A/C JOINT, COMPLETE RUPTURE OF ROTATOR CUFF  POST-OPERATIVE DIAGNOSIS:  left shoulder impingement syndrome degenerative arthritis a/c joint rotator cuff tear  PROCEDURE:  Procedure(s) (LRB) with comments: SHOULDER ARTHROSCOPY WITH SUBACROMIAL DECOMPRESSION (Left) - LEFT SHOULDER ARTHROSCOPY, SUBACROMIAL DECOMPRESSION, PARTIAL ACROMIOPLASTY WITH CORACOACROMIAL RELEASE, DISTAL CLAVICULECTOMY WITH ROTATOR CUFF REPAIR, DEBRIDEMENT OF LABRUM  SURGEON:  Surgeon(s) and Role:    * Loreta Ave, MD - Primary  PHYSICIAN ASSISTANT: Zonia Kief M   ANESTHESIA:   general  EBL:  Total I/O In: 1600 [I.V.:1600] Out: -   BLOOD ADMINISTERED:none  SPECIMEN:  No Specimen  DISPOSITION OF SPECIMEN:  N/A  COUNTS:  YES  TOURNIQUET:  * No tourniquets in log *  PATIENT DISPOSITION:  PACU - hemodynamically stable.

## 2012-04-09 NOTE — Transfer of Care (Signed)
Immediate Anesthesia Transfer of Care Note  Patient: Tonya Carlile [facility]  Procedure(s) Performed: Procedure(s) (LRB) with comments: SHOULDER ARTHROSCOPY WITH SUBACROMIAL DECOMPRESSION (Left) - LEFT SHOULDER ARTHROSCOPY, SUBACROMIAL DECOMPRESSION, PARTIAL ACROMIOPLASTY WITH CORACOACROMIAL RELEASE, DISTAL CLAVICULECTOMY WITH ROTATOR CUFF REPAIR, DEBRIDEMENT OF LABRUM  Patient Location: PACU  Anesthesia Type:General  Level of Consciousness: awake, alert  and oriented  Airway & Oxygen Therapy: Patient Spontanous Breathing and Patient connected to face mask oxygen  Post-op Assessment: Report given to PACU RN and Post -op Vital signs reviewed and stable  Post vital signs: Reviewed and stable  Complications: No apparent anesthesia complications

## 2012-04-09 NOTE — Anesthesia Preprocedure Evaluation (Signed)
Anesthesia Evaluation  Patient identified by MRN, date of birth, ID band Patient awake    Reviewed: Allergy & Precautions, H&P , NPO status , Patient's Chart, lab work & pertinent test results  Airway Mallampati: I TM Distance: >3 FB Neck ROM: Full    Dental   Pulmonary COPDCurrent Smoker,  + rhonchi         Cardiovascular + dysrhythmias Supra Ventricular Tachycardia + Valvular Problems/Murmurs Rhythm:Regular Rate:Normal  Bicuspid aortic valve   Neuro/Psych Anxiety Depression    GI/Hepatic GERD-  ,  Endo/Other    Renal/GU      Musculoskeletal   Abdominal   Peds  Hematology  (+) Blood dyscrasia, , CLL   Anesthesia Other Findings   Reproductive/Obstetrics                           Anesthesia Physical Anesthesia Plan  ASA: III  Anesthesia Plan: General   Post-op Pain Management:    Induction: Intravenous  Airway Management Planned: Oral ETT  Additional Equipment:   Intra-op Plan:   Post-operative Plan: Extubation in OR  Informed Consent: I have reviewed the patients History and Physical, chart, labs and discussed the procedure including the risks, benefits and alternatives for the proposed anesthesia with the patient or authorized representative who has indicated his/her understanding and acceptance.     Plan Discussed with: CRNA and Surgeon  Anesthesia Plan Comments:         Anesthesia Quick Evaluation

## 2012-04-10 ENCOUNTER — Encounter (HOSPITAL_BASED_OUTPATIENT_CLINIC_OR_DEPARTMENT_OTHER): Payer: Self-pay | Admitting: Orthopedic Surgery

## 2012-04-10 NOTE — Op Note (Signed)
NAMEMARKEL, KURTENBACH NO.:  [id number]  MEDICAL RECORD NO.:  [id number]  LOCATION:                                 FACILITY:  PHYSICIAN:  Loreta Ave, M.D. DATE OF BIRTH:  1950-08-02  DATE OF PROCEDURE:  04/09/2012 DATE OF DISCHARGE:                              OPERATIVE REPORT   PREOPERATIVE DIAGNOSIS:  Right shoulder chronic impingement, partial rotator cuff tearing, intra-articular portion long head biceps tendon. Degenerative joint disease, AC joint.  POSTOPERATIVE DIAGNOSIS:  Right shoulder chronic impingement, partial rotator cuff tearing, intra-articular portion long head biceps tendon. Degenerative joint disease, AC joint with the functional full-thickness tear rotator cuff supraspinatus tendon throughout the crescent region. Degenerative tearing superior labrum.  PROCEDURE:  Right shoulder exam under anesthesia.  Arthroscopy. Released resection intra-articular portion of biceps allowing it to recess in the bicipital groove for ulnar tenodesis.  Debridement of labrum.  Arthroscopic assisted rotator cuff repair FiberWire suture x2, swivel lock anchors x2.  Acromioplasty, bursectomy, CA ligament release. Excision of distal clavicle.  SURGEON:  Loreta Ave, M.D.  ASSISTANT:  Genene Churn. Barry Dienes, [place], present throughout the entire case and necessary for timely completion of procedure.  ANESTHESIA:  General.  BLOOD LOSS:  Minimal.  SPECIMENS:  None.  CULTURES:  None.  COMPLICATION:  None.  DRESSINGS:  Soft compressive with shoulder immobilizer.  PROCEDURE:  The patient was brought to the operating room, placed on the operating table in supine position.  After adequate anesthesia been obtained, left shoulder examined.  Full motion, stable shoulder.  Placed in a beach-chair position on the shoulder positioner, prepped and draped in usual sterile fashion.  Three portals anterior, posterior, and lateral.  Arthroscope introduced, shoulder  distended and inspected. Marked tearing intra-articular portion long head biceps tendon more than 50% compromise of the tendon throughout the shoulder.  The intra- articular portion resected and released off the glenoid and allowed the recess of the bicipital groove.  Complex tearing superior labrum debrided.  Articular cartilage otherwise looked good.  Supraspinatus had extensive tearing anterior two-thirds of the crescent region supraspinatus.  Not full-thickness but just about.  Functional full- thickness tear.  Debrided from above and below, mobilized for repair. From above, type 2 acromion.  Bursa was resected.  Acromioplasty to a type 1 acromion releasing CA ligament.  Grade 4 changes, marked spurring AC joint.  Periarticular spurs lateral centimeter of clavicle resected. With a cannula laterally, the cuff was captured 2 horizontal mattress sutures.  Firmly anchored down to the roughened tuberosity with 2 swivel lock anchors.  Nice firm watertight closure achieved.  Adequacy of decompression confirmed.  Instruments were removed.  Portals closed with nylon.  Sterile compressive dressing applied.  Shoulder immobilizer applied.  Anesthesia reversed.  Brought to recovery room.  Tolerated surgery well.  No complications.     Loreta Ave, M.D.     DFM/MEDQ  D:  04/09/2012  T:  04/10/2012  Job:  [id number]

## 2012-04-24 DIAGNOSIS — M75120 Complete rotator cuff tear or rupture of unspecified shoulder, not specified as traumatic: Secondary | ICD-10-CM | POA: Diagnosis not present

## 2012-04-24 DIAGNOSIS — M25519 Pain in unspecified shoulder: Secondary | ICD-10-CM | POA: Diagnosis not present

## 2012-04-30 DIAGNOSIS — M25519 Pain in unspecified shoulder: Secondary | ICD-10-CM | POA: Diagnosis not present

## 2012-04-30 DIAGNOSIS — M75120 Complete rotator cuff tear or rupture of unspecified shoulder, not specified as traumatic: Secondary | ICD-10-CM | POA: Diagnosis not present

## 2012-05-04 DIAGNOSIS — M25519 Pain in unspecified shoulder: Secondary | ICD-10-CM | POA: Diagnosis not present

## 2012-05-04 DIAGNOSIS — M75120 Complete rotator cuff tear or rupture of unspecified shoulder, not specified as traumatic: Secondary | ICD-10-CM | POA: Diagnosis not present

## 2012-05-04 DIAGNOSIS — Z4789 Encounter for other orthopedic aftercare: Secondary | ICD-10-CM | POA: Diagnosis not present

## 2012-05-07 DIAGNOSIS — M75120 Complete rotator cuff tear or rupture of unspecified shoulder, not specified as traumatic: Secondary | ICD-10-CM | POA: Diagnosis not present

## 2012-05-07 DIAGNOSIS — M25519 Pain in unspecified shoulder: Secondary | ICD-10-CM | POA: Diagnosis not present

## 2012-05-07 DIAGNOSIS — Z4789 Encounter for other orthopedic aftercare: Secondary | ICD-10-CM | POA: Diagnosis not present

## 2012-05-11 DIAGNOSIS — M25519 Pain in unspecified shoulder: Secondary | ICD-10-CM | POA: Diagnosis not present

## 2012-05-11 DIAGNOSIS — M75120 Complete rotator cuff tear or rupture of unspecified shoulder, not specified as traumatic: Secondary | ICD-10-CM | POA: Diagnosis not present

## 2012-05-16 ENCOUNTER — Other Ambulatory Visit: Payer: Self-pay

## 2012-05-18 DIAGNOSIS — M75120 Complete rotator cuff tear or rupture of unspecified shoulder, not specified as traumatic: Secondary | ICD-10-CM | POA: Diagnosis not present

## 2012-05-18 DIAGNOSIS — M25519 Pain in unspecified shoulder: Secondary | ICD-10-CM | POA: Diagnosis not present

## 2012-05-18 DIAGNOSIS — Z4789 Encounter for other orthopedic aftercare: Secondary | ICD-10-CM | POA: Diagnosis not present

## 2012-05-21 DIAGNOSIS — M25519 Pain in unspecified shoulder: Secondary | ICD-10-CM | POA: Diagnosis not present

## 2012-05-21 DIAGNOSIS — M75120 Complete rotator cuff tear or rupture of unspecified shoulder, not specified as traumatic: Secondary | ICD-10-CM | POA: Diagnosis not present

## 2012-05-25 DIAGNOSIS — Z4789 Encounter for other orthopedic aftercare: Secondary | ICD-10-CM | POA: Diagnosis not present

## 2012-05-25 DIAGNOSIS — M75120 Complete rotator cuff tear or rupture of unspecified shoulder, not specified as traumatic: Secondary | ICD-10-CM | POA: Diagnosis not present

## 2012-05-25 DIAGNOSIS — M25519 Pain in unspecified shoulder: Secondary | ICD-10-CM | POA: Diagnosis not present

## 2012-05-28 DIAGNOSIS — Z4789 Encounter for other orthopedic aftercare: Secondary | ICD-10-CM | POA: Diagnosis not present

## 2012-05-28 DIAGNOSIS — M75120 Complete rotator cuff tear or rupture of unspecified shoulder, not specified as traumatic: Secondary | ICD-10-CM | POA: Diagnosis not present

## 2012-05-28 DIAGNOSIS — M25519 Pain in unspecified shoulder: Secondary | ICD-10-CM | POA: Diagnosis not present

## 2012-06-01 DIAGNOSIS — M25519 Pain in unspecified shoulder: Secondary | ICD-10-CM | POA: Diagnosis not present

## 2012-06-01 DIAGNOSIS — M75120 Complete rotator cuff tear or rupture of unspecified shoulder, not specified as traumatic: Secondary | ICD-10-CM | POA: Diagnosis not present

## 2012-06-04 DIAGNOSIS — M75120 Complete rotator cuff tear or rupture of unspecified shoulder, not specified as traumatic: Secondary | ICD-10-CM | POA: Diagnosis not present

## 2012-06-04 DIAGNOSIS — M25519 Pain in unspecified shoulder: Secondary | ICD-10-CM | POA: Diagnosis not present

## 2012-06-08 DIAGNOSIS — M25519 Pain in unspecified shoulder: Secondary | ICD-10-CM | POA: Diagnosis not present

## 2012-06-08 DIAGNOSIS — M75120 Complete rotator cuff tear or rupture of unspecified shoulder, not specified as traumatic: Secondary | ICD-10-CM | POA: Diagnosis not present

## 2012-06-11 DIAGNOSIS — M25519 Pain in unspecified shoulder: Secondary | ICD-10-CM | POA: Diagnosis not present

## 2012-06-11 DIAGNOSIS — M75120 Complete rotator cuff tear or rupture of unspecified shoulder, not specified as traumatic: Secondary | ICD-10-CM | POA: Diagnosis not present

## 2012-06-15 DIAGNOSIS — M25519 Pain in unspecified shoulder: Secondary | ICD-10-CM | POA: Diagnosis not present

## 2012-06-15 DIAGNOSIS — M75120 Complete rotator cuff tear or rupture of unspecified shoulder, not specified as traumatic: Secondary | ICD-10-CM | POA: Diagnosis not present

## 2012-06-18 DIAGNOSIS — M25519 Pain in unspecified shoulder: Secondary | ICD-10-CM | POA: Diagnosis not present

## 2012-06-18 DIAGNOSIS — M75120 Complete rotator cuff tear or rupture of unspecified shoulder, not specified as traumatic: Secondary | ICD-10-CM | POA: Diagnosis not present

## 2012-06-22 ENCOUNTER — Encounter (HOSPITAL_COMMUNITY): Payer: Medicare Other | Attending: Oncology

## 2012-06-22 DIAGNOSIS — M25519 Pain in unspecified shoulder: Secondary | ICD-10-CM | POA: Diagnosis not present

## 2012-06-22 DIAGNOSIS — J449 Chronic obstructive pulmonary disease, unspecified: Secondary | ICD-10-CM | POA: Diagnosis not present

## 2012-06-22 DIAGNOSIS — C9110 Chronic lymphocytic leukemia of B-cell type not having achieved remission: Secondary | ICD-10-CM

## 2012-06-22 DIAGNOSIS — J4489 Other specified chronic obstructive pulmonary disease: Secondary | ICD-10-CM | POA: Insufficient documentation

## 2012-06-22 DIAGNOSIS — F17200 Nicotine dependence, unspecified, uncomplicated: Secondary | ICD-10-CM | POA: Diagnosis not present

## 2012-06-22 DIAGNOSIS — M75120 Complete rotator cuff tear or rupture of unspecified shoulder, not specified as traumatic: Secondary | ICD-10-CM | POA: Diagnosis not present

## 2012-06-22 LAB — COMPREHENSIVE METABOLIC PANEL
AST: 45 U/L — ABNORMAL HIGH (ref 0–37)
BUN: 16 mg/dL (ref 6–23)
CO2: 28 mEq/L (ref 19–32)
Calcium: 9.2 mg/dL (ref 8.4–10.5)
Chloride: 103 mEq/L (ref 96–112)
Creatinine, Ser: 0.83 mg/dL (ref 0.50–1.35)
GFR calc Af Amer: >90 mL/min (ref >90)
GFR calc non Af Amer: >90 mL/min (ref >90)
Glucose, Bld: 103 mg/dL — ABNORMAL HIGH (ref 70–99)
Total Bilirubin: 0.6 mg/dL (ref 0.3–1.2)

## 2012-06-22 LAB — CBC WITH DIFFERENTIAL/PLATELET
Eosinophils Absolute: 0.2 10*3/uL (ref 0.0–0.7)
Eosinophils Relative: 0 % (ref 0–5)
HCT: 44.2 % (ref 39.0–52.0)
Lymphocytes Relative: 92 % — ABNORMAL HIGH (ref 12–46)
Lymphs Abs: 92.4 10*3/uL — ABNORMAL HIGH (ref 0.7–4.0)
MCH: 30.9 pg (ref 26.0–34.0)
MCV: 92.3 fL (ref 78.0–100.0)
Monocytes Absolute: 1.9 10*3/uL — ABNORMAL HIGH (ref 0.1–1.0)
Platelets: 180 10*3/uL (ref 150–400)
RBC: 4.79 MIL/uL (ref 4.22–5.81)
WBC: 100.1 10*3/uL (ref 4.0–10.5)

## 2012-06-22 LAB — LACTATE DEHYDROGENASE: LDH: 187 U/L (ref 94–250)

## 2012-06-22 NOTE — Progress Notes (Signed)
Labs drawn today for cbc/diff,cmp,ldh 

## 2012-06-25 DIAGNOSIS — M25519 Pain in unspecified shoulder: Secondary | ICD-10-CM | POA: Diagnosis not present

## 2012-06-25 DIAGNOSIS — M75120 Complete rotator cuff tear or rupture of unspecified shoulder, not specified as traumatic: Secondary | ICD-10-CM | POA: Diagnosis not present

## 2012-06-29 ENCOUNTER — Encounter (HOSPITAL_BASED_OUTPATIENT_CLINIC_OR_DEPARTMENT_OTHER): Payer: Medicare Other | Admitting: Oncology

## 2012-06-29 ENCOUNTER — Encounter (HOSPITAL_COMMUNITY): Payer: Self-pay | Admitting: Oncology

## 2012-06-29 VITALS — BP 118/69 | HR 90 | Temp 97.9°F | Resp 16 | Wt 186.5 lb

## 2012-06-29 DIAGNOSIS — F411 Generalized anxiety disorder: Secondary | ICD-10-CM

## 2012-06-29 DIAGNOSIS — J4489 Other specified chronic obstructive pulmonary disease: Secondary | ICD-10-CM

## 2012-06-29 DIAGNOSIS — M75120 Complete rotator cuff tear or rupture of unspecified shoulder, not specified as traumatic: Secondary | ICD-10-CM | POA: Diagnosis not present

## 2012-06-29 DIAGNOSIS — C9110 Chronic lymphocytic leukemia of B-cell type not having achieved remission: Secondary | ICD-10-CM

## 2012-06-29 DIAGNOSIS — M25519 Pain in unspecified shoulder: Secondary | ICD-10-CM | POA: Diagnosis not present

## 2012-06-29 DIAGNOSIS — J449 Chronic obstructive pulmonary disease, unspecified: Secondary | ICD-10-CM | POA: Diagnosis not present

## 2012-06-29 DIAGNOSIS — R599 Enlarged lymph nodes, unspecified: Secondary | ICD-10-CM | POA: Diagnosis not present

## 2012-06-29 NOTE — Patient Instructions (Signed)
.  [facility] Discharge Instructions  RECOMMENDATIONS MADE BY THE CONSULTANT AND ANY TEST RESULTS WILL BE SENT TO YOUR REFERRING PHYSICIAN.  Exam good. Labs in July and see Dr. Mariel Sleet after labs Thank you for choosing [facility] to provide your oncology and hematology care.  To afford each patient quality time with our providers, please arrive at least 15 minutes before your scheduled appointment time.  With your help, our goal is to use those 15 minutes to complete the necessary work-up to ensure our physicians have the information they need to help with your evaluation and healthcare recommendations.    Effective January 1st, 2014, we ask that you re-schedule your appointment with our physicians should you arrive 10 or more minutes late for your appointment.  We strive to give you quality time with our providers, and arriving late affects you and other patients whose appointments are after yours.    Again, thank you for choosing [facility].  Our hope is that these requests will decrease the amount of time that you wait before being seen by our physicians.       _____________________________________________________________  Should you have questions after your visit to [facility], please contact our office at [phone] between the hours of 8:30 a.m. and 5:00 p.m.  Voicemails left after 4:30 p.m. will not be returned until the following business day.  For prescription refill requests, have your pharmacy contact our office with your prescription refill request.

## 2012-06-29 NOTE — Progress Notes (Signed)
Problem #1 CLL times many years presenting in November 2004. He has never required therapy. He does have hypogammaglobulinemia as well. His white count is now over 100,000 intermittently. He has no symptomatology however. #2 COPD still smoking a pack a day #3 left rotator cuff surgery in January. He is still getting rehabilitation he had no trouble tolerating the surgery however from a hematologic standpoint. He did however have steroid injection in his left elbow in December and I wonder if that contributed mildly to his rise in white count. He may need another steroid injection soon #4 DJD of the left knee and left shoulder #5 excessive alcohol use quitting in 1994 #6 CMV positive antibody titer possibly response for his chronic weakness and fatigability versus tick bite in August 2004 treated with doxycycline the thought by some to be post tick bite syndrome causing his fatigue and weakness.  #7 chronic anxiety on alprazolam  He is asymptomatic from a hematologic standpoint. He is still smoking unfortunately. The rest of his review of systems is negative except for chronic left shoulder discomfort since her surgery but he is working on that with rehabilitation.  BP 118/69  Pulse 90  Temp(Src) 97.9 F (36.6 C) (Oral)  Resp 16  Wt 186 lb 8 oz (84.596 kg)  BMI 25.29 kg/m2  He is in no acute distress. He has a small lymph node' 98 mm the base of the neck in the subclavicular fossa area bilaterally. He has no other palpable lymph nodes. He has no palpable hepatosplenomegaly including in the right lateral decubitus position. His lungs are clear but with diminished breath sounds. His heart still shows a grade 1/6 systolic ejection murmur without S3 gallop. He has no leg edema no arm edema. His abdomen remains soft and nontender without distention.  His blood counts really shows stabilization of his hemoglobin in the normal range, normal platelets, but a rising white count which is not in need of  therapy presently. We will see him in 3 months for followup here.

## 2012-07-02 DIAGNOSIS — M75120 Complete rotator cuff tear or rupture of unspecified shoulder, not specified as traumatic: Secondary | ICD-10-CM | POA: Diagnosis not present

## 2012-07-02 DIAGNOSIS — M25519 Pain in unspecified shoulder: Secondary | ICD-10-CM | POA: Diagnosis not present

## 2012-07-06 DIAGNOSIS — M75120 Complete rotator cuff tear or rupture of unspecified shoulder, not specified as traumatic: Secondary | ICD-10-CM | POA: Diagnosis not present

## 2012-07-06 DIAGNOSIS — M25519 Pain in unspecified shoulder: Secondary | ICD-10-CM | POA: Diagnosis not present

## 2012-07-06 DIAGNOSIS — Z4789 Encounter for other orthopedic aftercare: Secondary | ICD-10-CM | POA: Diagnosis not present

## 2012-08-18 DIAGNOSIS — M25519 Pain in unspecified shoulder: Secondary | ICD-10-CM | POA: Diagnosis not present

## 2012-10-05 ENCOUNTER — Encounter (HOSPITAL_COMMUNITY): Payer: Medicare Other | Attending: Internal Medicine

## 2012-10-05 DIAGNOSIS — C9110 Chronic lymphocytic leukemia of B-cell type not having achieved remission: Secondary | ICD-10-CM | POA: Insufficient documentation

## 2012-10-05 LAB — CBC WITH DIFFERENTIAL/PLATELET
Basophils Absolute: 0.3 10*3/uL — ABNORMAL HIGH (ref 0.0–0.1)
Eosinophils Relative: 0 % (ref 0–5)
Lymphocytes Relative: 93 % — ABNORMAL HIGH (ref 12–46)
Lymphs Abs: 98.8 10*3/uL — ABNORMAL HIGH (ref 0.7–4.0)
MCV: 93.1 fL (ref 78.0–100.0)
Neutro Abs: 5.8 10*3/uL (ref 1.7–7.7)
Neutrophils Relative %: 5 % — ABNORMAL LOW (ref 43–77)
Platelets: 201 10*3/uL (ref 150–400)
RBC: 5.1 MIL/uL (ref 4.22–5.81)
RDW: 13.5 % (ref 11.5–15.5)
WBC: 106.7 10*3/uL (ref 4.0–10.5)

## 2012-10-05 LAB — COMPREHENSIVE METABOLIC PANEL
ALT: 14 U/L (ref 0–53)
AST: 18 U/L (ref 0–37)
Albumin: 4.1 g/dL (ref 3.5–5.2)
CO2: 29 mEq/L (ref 19–32)
Chloride: 106 mEq/L (ref 96–112)
Creatinine, Ser: 0.93 mg/dL (ref 0.50–1.35)
Sodium: 141 mEq/L (ref 135–145)
Total Bilirubin: 0.3 mg/dL (ref 0.3–1.2)

## 2012-10-05 NOTE — Progress Notes (Signed)
CRITICAL VALUE ALERT Critical value received:  WBC 106.7K Date of notification:  10/05/2012  Time of notification: 1000 Critical value read back:  yes Nurse who received alert:  Peyson Delao, Blair Hailey, RN MD notified (1st page):  T. Jacalyn Lefevre, PA-C - no further follow-up needed @ present.

## 2012-10-05 NOTE — Progress Notes (Signed)
Labs drawn today for cbc/diff,cmp,ldh 

## 2012-10-07 ENCOUNTER — Ambulatory Visit (HOSPITAL_COMMUNITY): Payer: Medicare Other | Admitting: Oncology

## 2012-10-12 ENCOUNTER — Encounter (HOSPITAL_BASED_OUTPATIENT_CLINIC_OR_DEPARTMENT_OTHER): Payer: Medicare Other

## 2012-10-12 ENCOUNTER — Encounter (HOSPITAL_COMMUNITY): Payer: Self-pay

## 2012-10-12 VITALS — BP 132/76 | HR 104 | Temp 98.2°F | Resp 16 | Wt 189.5 lb

## 2012-10-12 DIAGNOSIS — C9110 Chronic lymphocytic leukemia of B-cell type not having achieved remission: Secondary | ICD-10-CM | POA: Diagnosis not present

## 2012-10-12 NOTE — Progress Notes (Signed)
[facility] [phone]   [phone]  OFFICE PROGRESS NOTE  Alfred Brown., MD [address] [place] [address]  DIAGNOSIS: CLL  ONCOLOGIC HISTORY: According to his records she was diagnosed with CLL in 2004 has not required any therapy since then.  He's been under close observation with 3 monthly followup visits.  INTERVAL HISTORY:   Alfred Brown 62 y.o. male returns to the clinic today for her scheduled followup.  Tells me he feels well except for continued fatigue.  He denies any night sweats no significant weight loss of fever . He has not noticed any increase in lymphadenopathy.  He was accompanied by his wife today.  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  He otherwise denies any new problem.  He denies any history of recent or ongoing infection.  MEDICAL HISTORY: Past Medical History  Diagnosis Date  . CLL (chronic lymphocytic leukemia)   . COPD (chronic obstructive pulmonary disease)   . CMV (cytomegalovirus)   . Depression with anxiety   . DJD (degenerative joint disease)   . Lipoma     left chest wall  . Right bundle branch block     + left posterior fascicular block  . GERD (gastroesophageal reflux disease)   . Hyperlipidemia   . Palpitations 2004    PVCs; borderline stress nuclear in 2004, negative in 2007; Echo 2007; AV-sclerotic, very mild AI  . Aortic valve disorder     Mild insufficiency  . Tobacco abuse     80 pack years    ALLERGIES:  has No Known Allergies.  MEDICATIONS:  Current Outpatient Prescriptions  Medication Sig Dispense Refill  . albuterol (PROVENTIL HFA;VENTOLIN HFA) 108 (90 BASE) MCG/ACT inhaler Inhale 2 puffs into the lungs every 6 (six) hours as needed.      . ALPRAZolam (XANAX) 1 MG tablet Take 1 mg by mouth 3 (three) times daily as needed.        [name] aspirin 325 MG tablet Take 325 mg by mouth  daily.        [name] buPROPion (WELLBUTRIN SR) 150 MG 12 hr tablet Take 150 mg by mouth 2 (two) times daily.        . calcium carbonate (OS-CAL) 600 MG TABS Take 600 mg by mouth daily.        . fish oil-omega-3 fatty acids 1000 MG capsule Take 2 g by mouth daily.        . fluticasone (FLONASE) 50 MCG/ACT nasal spray Place 2 sprays into the nose daily.        [name] HYDROcodone-acetaminophen (NORCO/VICODIN) 5-325 MG per tablet Take 1 tablet by mouth every 8 (eight) hours as needed.      [name] ibuprofen (ADVIL,MOTRIN) 200 MG tablet Take 200 mg by mouth every 6 (six) hours as needed.       . Loratadine 10 MG CAPS Take 10 mg by mouth daily.       . methocarbamol (ROBAXIN) 500 MG tablet Take 500 mg by mouth as needed.      . niacin-lovastatin (ADVICOR) 500-20 MG 24 hr tablet Take 1 tablet by mouth at bedtime.        [name] acyclovir (ZOVIRAX) 800 MG tablet Take 800 mg by mouth as needed. 5 tablets daily as needed       . amoxicillin (AMOXIL) 500 MG  tablet Take 2,000 mg by mouth as needed. Prior to any dental procedures       . Diphenhydramine-PE-APAP 12.5-5-325 MG TABS Take 1 tablet by mouth as needed. sinus      . Multiple Vitamin (MULTIVITAMIN) tablet Take 1 tablet by mouth daily.        [name] omeprazole (PRILOSEC) 20 MG capsule Take 20 mg by mouth as needed.         No current facility-administered medications for this visit.    SURGICAL HISTORY:  Past Surgical History  Procedure Laterality Date  . Rotator cuff repair  1997    right  . Ganglion cyst excision  1997    left wrist  . Colonoscopy  01/2012    Negative screening study  . Shoulder arthroscopy with subacromial decompression  04/09/2012    Procedure: SHOULDER ARTHROSCOPY WITH SUBACROMIAL DECOMPRESSION;  Surgeon: Loreta Ave, MD;  Location: [facility];  Service: Orthopedics;  Laterality: Left;  LEFT SHOULDER ARTHROSCOPY, SUBACROMIAL DECOMPRESSION, PARTIAL ACROMIOPLASTY WITH CORACOACROMIAL RELEASE, DISTAL CLAVICULECTOMY WITH ROTATOR CUFF  REPAIR, DEBRIDEMENT OF LABRUM     REVIEW OF SYSTEMS: 14 point review of system is as in the history above otherwise negative.    PHYSICAL EXAMINATION:  Blood pressure 132/76, pulse 104, temperature 98.2 F (36.8 C), temperature source Oral, resp. rate 16, weight 189 lb 8 oz (85.957 kg). GENERAL: No distress, well nourished. SKIN:  No rashes or significant lesions  HEAD: Normocephalic, No masses, lesions, tenderness or abnormalities  EYES: Conjunctiva are pink and non-injected  ENT: External ears normal ,lips, buccal mucosa, and tongue normal and mucous membranes are moist  LYMPH: multiple bilateral inguinal lymphadenopathy noted less than   5 CM. LUNGS: clear to auscultation , no crackles or wheezes HEART: regular rate & rhythm, no murmurs, no gallops, S1 normal and S2 normal  ABDOMEN: Abdomen soft, non-tender, normal bowel sounds, no masses or organomegaly and no hepatosplenomegaly palpable. MSK: No CVA tenderness and no tenderness on percussion of the back or rib cage. EXTREMITIES: No edema, no skin discoloration or tenderness NEURO: alert & oriented , no focal motor/sensory deficits.     LABORATORY DATA: Lab Results  Component Value Date   WBC 106.7* 10/05/2012   HGB 15.5 10/05/2012   HCT 47.5 10/05/2012   MCV 93.1 10/05/2012   PLT 201 10/05/2012      Chemistry      Component Value Date/Time   NA 141 10/05/2012 0931   K 4.3 10/05/2012 0931   CL 106 10/05/2012 0931   CO2 29 10/05/2012 0931   BUN 14 10/05/2012 0931   CREATININE 0.93 10/05/2012 0931      Component Value Date/Time   CALCIUM 9.5 10/05/2012 0931   ALKPHOS 130* 10/05/2012 0931   AST 18 10/05/2012 0931   ALT 14 10/05/2012 0931   BILITOT 0.3 10/05/2012 0931     Labs reviewed and know.  Discussed with patient.  RADIOGRAPHIC STUDIES: No results found.   ASSESSMENT: Mr. [name] has CLL; stage I from ria classification, with no clear or convincing  treatment indication at this time. His fatigue is related and dates unclear if this  is related to his via CMV infection or poor physical activity.  The fact that this is isolated, eventhough could still possibly could be related to CLL makes somewhat unlikely to be from CLL. He also has no cytopenias.  PLAN:  1. I agree with continued observation.   2. I asked patient to increase his physical activity, if  no improvement at his next visit in 3 months a week and consider possible treatment of CLL to see if his fatigue  will improve. Fatigue is a subjective and difficult to measure now be very cautious in using this as treatment indication. 3. He was asked to call if he notices any B. Symptoms or new concern.  I educated him on B. Symptoms.     All questions were satisfactorily answered.  I spent more than 50 % counseling the patient face to face. The total time spent in the appointment was 30 minutes.   Sherral Hammers, MD FACP. Hematology/Oncology.

## 2012-11-23 DIAGNOSIS — K219 Gastro-esophageal reflux disease without esophagitis: Secondary | ICD-10-CM | POA: Diagnosis not present

## 2012-11-23 DIAGNOSIS — S335XXA Sprain of ligaments of lumbar spine, initial encounter: Secondary | ICD-10-CM | POA: Diagnosis not present

## 2012-11-23 DIAGNOSIS — IMO0002 Reserved for concepts with insufficient information to code with codable children: Secondary | ICD-10-CM | POA: Diagnosis not present

## 2012-11-23 DIAGNOSIS — C9111 Chronic lymphocytic leukemia of B-cell type in remission: Secondary | ICD-10-CM | POA: Diagnosis not present

## 2012-11-23 DIAGNOSIS — J438 Other emphysema: Secondary | ICD-10-CM | POA: Diagnosis not present

## 2012-12-21 DIAGNOSIS — Z79899 Other long term (current) drug therapy: Secondary | ICD-10-CM | POA: Diagnosis not present

## 2012-12-21 DIAGNOSIS — IMO0002 Reserved for concepts with insufficient information to code with codable children: Secondary | ICD-10-CM | POA: Diagnosis not present

## 2012-12-21 DIAGNOSIS — Z125 Encounter for screening for malignant neoplasm of prostate: Secondary | ICD-10-CM | POA: Diagnosis not present

## 2012-12-28 DIAGNOSIS — M549 Dorsalgia, unspecified: Secondary | ICD-10-CM | POA: Diagnosis not present

## 2012-12-28 DIAGNOSIS — H8309 Labyrinthitis, unspecified ear: Secondary | ICD-10-CM | POA: Diagnosis not present

## 2012-12-28 DIAGNOSIS — H6690 Otitis media, unspecified, unspecified ear: Secondary | ICD-10-CM | POA: Diagnosis not present

## 2012-12-28 DIAGNOSIS — IMO0002 Reserved for concepts with insufficient information to code with codable children: Secondary | ICD-10-CM | POA: Diagnosis not present

## 2013-01-04 ENCOUNTER — Encounter (HOSPITAL_COMMUNITY): Payer: Medicare Other | Attending: Internal Medicine

## 2013-01-04 ENCOUNTER — Telehealth (HOSPITAL_COMMUNITY): Payer: Self-pay

## 2013-01-04 DIAGNOSIS — H8309 Labyrinthitis, unspecified ear: Secondary | ICD-10-CM | POA: Diagnosis not present

## 2013-01-04 DIAGNOSIS — C9110 Chronic lymphocytic leukemia of B-cell type not having achieved remission: Secondary | ICD-10-CM | POA: Diagnosis not present

## 2013-01-04 DIAGNOSIS — C9111 Chronic lymphocytic leukemia of B-cell type in remission: Secondary | ICD-10-CM | POA: Diagnosis not present

## 2013-01-04 DIAGNOSIS — Z23 Encounter for immunization: Secondary | ICD-10-CM | POA: Diagnosis not present

## 2013-01-04 DIAGNOSIS — R269 Unspecified abnormalities of gait and mobility: Secondary | ICD-10-CM | POA: Diagnosis not present

## 2013-01-04 DIAGNOSIS — H6120 Impacted cerumen, unspecified ear: Secondary | ICD-10-CM | POA: Diagnosis not present

## 2013-01-04 DIAGNOSIS — IMO0002 Reserved for concepts with insufficient information to code with codable children: Secondary | ICD-10-CM | POA: Diagnosis not present

## 2013-01-04 LAB — CBC WITH DIFFERENTIAL/PLATELET
Eosinophils Absolute: 0.2 10*3/uL (ref 0.0–0.7)
Eosinophils Relative: 0 % (ref 0–5)
HCT: 48.5 % (ref 39.0–52.0)
Lymphocytes Relative: 91 % — ABNORMAL HIGH (ref 12–46)
Lymphs Abs: 123.1 10*3/uL — ABNORMAL HIGH (ref 0.7–4.0)
MCH: 31.1 pg (ref 26.0–34.0)
MCV: 94.9 fL (ref 78.0–100.0)
Monocytes Absolute: 2.1 10*3/uL — ABNORMAL HIGH (ref 0.1–1.0)
RBC: 5.11 MIL/uL (ref 4.22–5.81)
WBC: 134.7 10*3/uL (ref 4.0–10.5)

## 2013-01-04 LAB — COMPREHENSIVE METABOLIC PANEL
BUN: 15 mg/dL (ref 6–23)
CO2: 29 mEq/L (ref 19–32)
Chloride: 106 mEq/L (ref 96–112)
Creatinine, Ser: 0.94 mg/dL (ref 0.50–1.35)
GFR calc Af Amer: >90 mL/min (ref >90)
GFR calc non Af Amer: 88 mL/min — ABNORMAL LOW (ref >90)
Glucose, Bld: 131 mg/dL — ABNORMAL HIGH (ref 70–99)
Total Bilirubin: 0.5 mg/dL (ref 0.3–1.2)

## 2013-01-04 LAB — LACTATE DEHYDROGENASE: LDH: 159 U/L (ref 94–250)

## 2013-01-04 NOTE — Progress Notes (Signed)
Labs drawn today for cbc/diff,cmp,ldh 

## 2013-01-04 NOTE — Telephone Encounter (Signed)
CRITICAL VALUE ALERT Critical value received:  WBC 134.7 Date of notification:  01/04/13  Time of notification: 11:58 AM Critical value read back:  yes Nurse who received alert:  Tobie Lords, RN MD notified (1st page):  Dellis Anes, PA-C

## 2013-01-06 ENCOUNTER — Other Ambulatory Visit: Payer: Self-pay | Admitting: Otolaryngology

## 2013-01-06 DIAGNOSIS — R2689 Other abnormalities of gait and mobility: Secondary | ICD-10-CM

## 2013-01-06 DIAGNOSIS — R269 Unspecified abnormalities of gait and mobility: Secondary | ICD-10-CM

## 2013-01-10 NOTE — Progress Notes (Signed)
Cassell Smiles., MD [address] [place] [address]  Chronic lymphocytic leukemia - Plan: CBC with Differential, Comprehensive metabolic panel, Lactate dehydrogenase, Sedimentation rate, Reticulocytes, Beta 2 microglobuline, serum  CURRENT THERAPY: Observation  INTERVAL HISTORY: Alfred Brown 62 y.o. male returns for  regular  visit for followup of CLL (Rai Stage I with enlarged inguinal nodes, no hepatosplenomegaly, normal Hgb and platelet count) presenting in November 2004, not requiring therapeutic intervention.  AND hypogammaglobulinemia.  I personally reviewed and went over laboratory results with the patient.  Labs from 01/04/2013 shows a WBC of 134.7, Hgb 15.9 g/dL, platelet count of 027,253 with an absolute lymphocyte count of 123.1.  Metabolic panel is unimpressive.  LDH of 159.  We reviewed the NCCN guidelines regarding indications for treatment.  NCCN guidelines for indication for treatment of CLL are:  A. Eligible for clinical trial  B. Significant disease-related symptoms   1. Fatigue (severe)   2. Night sweats   3. Weight loss   4. Fever without infection  C. Threatened end-organ function  D. Progressive bulky disease (spleen >6cm below costal margin, lymph nodes >10 cm)  E. Progressive anemia  F. Progressive thrombocytopenia.  After reviewing the NCCN guidelines as mentioned above with the patient, he admitted that the information was helpful and he was appreciative of the information.   Additionally, I reviewed the Rai Staging for CLL:  Stage 0: Lymphocytosis, lymphocytes in blood > 15,000/mcL and >40% lymphocytes in bone marrow (Risk status is low).  Stage I: Stage 0 with enlarged lymph nodes (Risk status is intermediate).  Stage II: Stage 0-I with splenomegaly, hepatomegaly, or both (Risk status is intermediate)  Stage III: Stage 0-II with Hemoglobin < 11.0 g/dL or Hematocrit <66% (Risk status is high).  Stage IV: Stage o=III with  platelets < 100,000/mcL (risk status is high).  Anas denies any complaints and ROS questioning is negative fro a hematologic standpoint.  He denies any B symptoms including fevers, chills, night sweats, decreased appetite, unintentional weight loss.    He reports 2 episodes of dizziness.  He subsequently has seen ENT, Dr. Jaci Lazier who the patient says does not think this is an ear or inner ear issues.  Therefore, an MRI of brain is ordered and is to be completed on Wednesday 01/13/2013.  We will try to keep an eye out for this test.   Past Medical History  Diagnosis Date  . CLL (chronic lymphocytic leukemia)   . COPD (chronic obstructive pulmonary disease)   . CMV (cytomegalovirus)   . Depression with anxiety   . DJD (degenerative joint disease)   . Lipoma     left chest wall  . Right bundle branch block     + left posterior fascicular block  . GERD (gastroesophageal reflux disease)   . Hyperlipidemia   . Palpitations 2004    PVCs; borderline stress nuclear in 2004, negative in 2007; Echo 2007; AV-sclerotic, very mild AI  . Aortic valve disorder     Mild insufficiency  . Tobacco abuse     80 pack years    has GERD (gastroesophageal reflux disease); Hyperlipidemia; Palpitations; Chronic lymphocytic leukemia; Aortic valve disorder; Laboratory test; Right bundle branch block; and Tobacco abuse on his problem list.     has No Known Allergies.  Mr. Cannedy does not currently have medications on file.  Past Surgical History  Procedure Laterality Date  . Rotator cuff repair  1997    right  . Ganglion  cyst excision  1997    left wrist  . Colonoscopy  01/2012    Negative screening study  . Shoulder arthroscopy with subacromial decompression  04/09/2012    Procedure: SHOULDER ARTHROSCOPY WITH SUBACROMIAL DECOMPRESSION;  Surgeon: Loreta Ave, MD;  Location:  [facility];  Service: Orthopedics;  Laterality: Left;  LEFT SHOULDER ARTHROSCOPY, SUBACROMIAL DECOMPRESSION,  PARTIAL ACROMIOPLASTY WITH CORACOACROMIAL RELEASE, DISTAL CLAVICULECTOMY WITH ROTATOR CUFF REPAIR, DEBRIDEMENT OF LABRUM    Denies any headaches, dizziness, double vision, fevers, chills, night sweats, nausea, vomiting, diarrhea, constipation, chest pain, heart palpitations, shortness of breath, blood in stool, black tarry stool, urinary pain, urinary burning, urinary frequency, hematuria.   PHYSICAL EXAMINATION  ECOG PERFORMANCE STATUS: 0 - Asymptomatic  Filed Vitals:   01/11/13 1133  BP: 137/80  Pulse: 85  Temp: 98.1 F (36.7 C)  Resp: 16    GENERAL:alert, no distress, well nourished, well developed, comfortable, cooperative and smiling SKIN: skin color, texture, turgor are normal, no rashes or significant lesions HEAD: Normocephalic, No masses, lesions, tenderness or abnormalities EYES: normal, PERRLA, EOMI, Conjunctiva are pink and non-injected EARS: External ears normal OROPHARYNX:mucous membranes are moist  NECK: supple, no adenopathy, trachea midline LYMPH:  no hepatosplenomegaly, right and left inguinal lymph nodes measuring less than 1 cm in size.  BREAST:not examined LUNGS: clear to auscultation and percussion HEART: regular rate & rhythm, no murmurs, no gallops, S1 normal and S2 normal ABDOMEN:abdomen soft, non-tender, obese, normal bowel sounds, no masses or organomegaly and no hepatosplenomegaly BACK: Back symmetric, no curvature., No CVA tenderness EXTREMITIES:less then 2 second capillary refill, no joint deformities, effusion, or inflammation, no edema, no skin discoloration, no clubbing, no cyanosis  NEURO: alert & oriented x 3 with fluent speech, no focal motor/sensory deficits, gait normal    LABORATORY DATA: CBC    Component Value Date/Time   WBC 134.7* 01/04/2013 0947   RBC 5.11 01/04/2013 0947   HGB 15.9 01/04/2013 0947   HCT 48.5 01/04/2013 0947   PLT 203 01/04/2013 0947   MCV 94.9 01/04/2013 0947   MCH 31.1 01/04/2013 0947   MCHC 32.8 01/04/2013 0947    RDW 14.1 01/04/2013 0947   LYMPHSABS 123.1* 01/04/2013 0947   MONOABS 2.1* 01/04/2013 0947   EOSABS 0.2 01/04/2013 0947   BASOSABS 0.6* 01/04/2013 0947     Chemistry      Component Value Date/Time   NA 144 01/04/2013 0947   K 3.8 01/04/2013 0947   CL 106 01/04/2013 0947   CO2 29 01/04/2013 0947   BUN 15 01/04/2013 0947   CREATININE 0.94 01/04/2013 0947      Component Value Date/Time   CALCIUM 9.6 01/04/2013 0947   ALKPHOS 121* 01/04/2013 0947   AST 18 01/04/2013 0947   ALT 13 01/04/2013 0947   BILITOT 0.5 01/04/2013 0947     Results for SAIVON, PROWSE (MRN [id number]) as of 01/10/2013 11:20  Ref. Range 01/04/2013 09:47  LDH Latest Range: 94-250 U/L 159      ASSESSMENT:  1. CLL presenting in November 2004, not requiring therapeutic intervention. 2. Hypogammaglobulinemia 3. COPD still smoking a pack a day  4. DJD of the left knee and left shoulder  5. Excessive alcohol use quitting in 1994  6. CMV positive antibody titer possibly response for his chronic weakness and fatigability versus tick bite in August 2004 treated with doxycycline the thought by some to be post tick bite syndrome causing his fatigue and weakness.  7. Chronic anxiety on alprazolam 8. Dizziness  x 2 episodes 9. Left ear discomfort, followed by ENT Dr. Jaci Lazier.  Patient Active Problem List   Diagnosis Date Noted  . Laboratory test 03/27/2012  . GERD (gastroesophageal reflux disease)   . Hyperlipidemia   . Palpitations   . Chronic lymphocytic leukemia   . Aortic valve disorder   . Right bundle branch block   . Tobacco abuse      PLAN:  1. I personally reviewed and went over laboratory results with the patient. 2. Labs in 3 months: CBC diff, CMET, LDH, ESR, B2M, reticulocyte count. 3. Patient education regarding NCCN guidelines regarding treatment. 4. Patient education regarding Rai staging of CLL 5. Undergoing MRI of brain by ENT on Wednesday. 6. Return in 3 weeks for follow-up of MRI 7. Return in 3 months  for follow-up   THERAPY PLAN:  From a hematologic standpoint, he is stable.  His WBC is appreciated, but he does not display any cytopenias or other indications for treatment at this time.  NCCN guidelines for indication for treatment of CLL are:  A. Eligible for clinical trial  B. Significant disease-related symptoms   1. Fatigue (severe)   2. Night sweats   3. Weight loss   4. Fever without infection  C. Threatened end-organ function  D. Progressive bulky disease (spleen >6cm below costal margin, lymph nodes >10 cm)  E. Progressive anemia  F. Progressive thrombocytopenia.   All questions were answered. The patient knows to call the clinic with any problems, questions or concerns. We can certainly see the patient much sooner if necessary.  Patient and plan discussed with Dr. Alla German and he is in agreement with the aforementioned.   Tayja Manzer

## 2013-01-11 ENCOUNTER — Encounter (HOSPITAL_BASED_OUTPATIENT_CLINIC_OR_DEPARTMENT_OTHER): Payer: Medicare Other | Admitting: Oncology

## 2013-01-11 ENCOUNTER — Encounter (HOSPITAL_COMMUNITY): Payer: Self-pay | Admitting: Oncology

## 2013-01-11 VITALS — BP 137/80 | HR 85 | Temp 98.1°F | Resp 16 | Wt 185.0 lb

## 2013-01-11 DIAGNOSIS — C9110 Chronic lymphocytic leukemia of B-cell type not having achieved remission: Secondary | ICD-10-CM

## 2013-01-11 NOTE — Patient Instructions (Signed)
[facility] Discharge Instructions  RECOMMENDATIONS MADE BY THE CONSULTANT AND ANY TEST RESULTS WILL BE SENT TO YOUR REFERRING PHYSICIAN.  EXAM FINDINGS BY THE PHYSICIAN TODAY AND SIGNS OR SYMPTOMS TO REPORT TO CLINIC OR PRIMARY PHYSICIAN: Exam and findings as discussed by Dellis Anes, PA-C.  Can feel a few tiny lymph nodes in your groin only.  Report fevers, night sweats, unexplained weight loss, etc.  MEDICATIONS PRESCRIBED:  none  INSTRUCTIONS/FOLLOW-UP: Tentative appointment in 3 weeks (dependent upon MRI)  Can be cancelled if not needed. Labs and follow-up in 3 months.  Thank you for choosing [facility] to provide your oncology and hematology care.  To afford each patient quality time with our providers, please arrive at least 15 minutes before your scheduled appointment time.  With your help, our goal is to use those 15 minutes to complete the necessary work-up to ensure our physicians have the information they need to help with your evaluation and healthcare recommendations.    Effective January 1st, 2014, we ask that you re-schedule your appointment with our physicians should you arrive 10 or more minutes late for your appointment.  We strive to give you quality time with our providers, and arriving late affects you and other patients whose appointments are after yours.    Again, thank you for choosing [facility]  Our hope is that these requests will decrease the amount of time that you wait before being seen by our physicians.       _____________________________________________________________  Should you have questions after your visit to [facility], please contact our office at [phone] between the hours of 8:30 a.m. and 5:00 p.m.  Voicemails left after 4:30 p.m. will not be returned until the following business day.  For prescription refill requests, have your pharmacy contact our office with your prescription  refill request.

## 2013-01-13 ENCOUNTER — Ambulatory Visit
Admission: RE | Admit: 2013-01-13 | Discharge: 2013-01-13 | Disposition: A | Discharge status: Home / Self Care | Payer: Medicare Other | Source: Ambulatory Visit | Attending: Otolaryngology | Admitting: Otolaryngology

## 2013-01-13 DIAGNOSIS — R51 Headache: Secondary | ICD-10-CM | POA: Diagnosis not present

## 2013-01-13 DIAGNOSIS — R42 Dizziness and giddiness: Secondary | ICD-10-CM | POA: Diagnosis not present

## 2013-01-13 DIAGNOSIS — R2689 Other abnormalities of gait and mobility: Secondary | ICD-10-CM

## 2013-01-13 MED ORDER — GADOBENATE DIMEGLUMINE 529 MG/ML IV SOLN
18.0000 mL | Freq: Once | INTRAVENOUS | Status: AC | PRN
  Administered 2013-01-13: 18 mL via INTRAVENOUS

## 2013-01-18 DIAGNOSIS — H903 Sensorineural hearing loss, bilateral: Secondary | ICD-10-CM | POA: Diagnosis not present

## 2013-01-18 DIAGNOSIS — H905 Unspecified sensorineural hearing loss: Secondary | ICD-10-CM | POA: Diagnosis not present

## 2013-01-18 DIAGNOSIS — R269 Unspecified abnormalities of gait and mobility: Secondary | ICD-10-CM | POA: Diagnosis not present

## 2013-02-01 ENCOUNTER — Ambulatory Visit (HOSPITAL_COMMUNITY): Payer: Medicare Other | Admitting: Oncology

## 2013-02-01 ENCOUNTER — Ambulatory Visit (HOSPITAL_COMMUNITY)
Admission: RE | Admit: 2013-02-01 | Discharge: 2013-02-01 | Disposition: A | Discharge status: Home / Self Care | Payer: Medicare Other | Source: Ambulatory Visit | Attending: Internal Medicine | Admitting: Internal Medicine

## 2013-02-01 ENCOUNTER — Other Ambulatory Visit (HOSPITAL_COMMUNITY): Payer: Self-pay | Admitting: Internal Medicine

## 2013-02-01 DIAGNOSIS — M5137 Other intervertebral disc degeneration, lumbosacral region: Secondary | ICD-10-CM | POA: Insufficient documentation

## 2013-02-01 DIAGNOSIS — M51379 Other intervertebral disc degeneration, lumbosacral region without mention of lumbar back pain or lower extremity pain: Secondary | ICD-10-CM | POA: Insufficient documentation

## 2013-02-01 DIAGNOSIS — M533 Sacrococcygeal disorders, not elsewhere classified: Secondary | ICD-10-CM | POA: Insufficient documentation

## 2013-02-01 DIAGNOSIS — J449 Chronic obstructive pulmonary disease, unspecified: Secondary | ICD-10-CM | POA: Diagnosis not present

## 2013-02-01 DIAGNOSIS — M549 Dorsalgia, unspecified: Secondary | ICD-10-CM

## 2013-02-01 DIAGNOSIS — H8120 Vestibular neuronitis, unspecified ear: Secondary | ICD-10-CM | POA: Diagnosis not present

## 2013-02-01 DIAGNOSIS — IMO0002 Reserved for concepts with insufficient information to code with codable children: Secondary | ICD-10-CM | POA: Diagnosis not present

## 2013-02-04 ENCOUNTER — Other Ambulatory Visit: Payer: Self-pay

## 2013-03-15 DIAGNOSIS — K219 Gastro-esophageal reflux disease without esophagitis: Secondary | ICD-10-CM | POA: Diagnosis not present

## 2013-03-15 DIAGNOSIS — J449 Chronic obstructive pulmonary disease, unspecified: Secondary | ICD-10-CM | POA: Diagnosis not present

## 2013-03-15 DIAGNOSIS — IMO0002 Reserved for concepts with insufficient information to code with codable children: Secondary | ICD-10-CM | POA: Diagnosis not present

## 2013-03-15 DIAGNOSIS — Z0000 Encounter for general adult medical examination without abnormal findings: Secondary | ICD-10-CM | POA: Diagnosis not present

## 2013-04-05 ENCOUNTER — Encounter (HOSPITAL_COMMUNITY): Payer: Medicare Other | Attending: Internal Medicine

## 2013-04-05 DIAGNOSIS — C9110 Chronic lymphocytic leukemia of B-cell type not having achieved remission: Secondary | ICD-10-CM

## 2013-04-05 LAB — RETICULOCYTES
RBC.: 4.82 MIL/uL (ref 4.22–5.81)
Retic Count, Absolute: 57.8 10*3/uL (ref 19.0–186.0)
Retic Ct Pct: 1.2 % (ref 0.4–3.1)

## 2013-04-05 LAB — CBC WITH DIFFERENTIAL/PLATELET
Basophils Absolute: 0.3 10*3/uL — ABNORMAL HIGH (ref 0.0–0.1)
Basophils Relative: 0 % (ref 0–1)
EOS ABS: 0.2 10*3/uL (ref 0.0–0.7)
Eosinophils Relative: 0 % (ref 0–5)
HEMATOCRIT: 45.3 % (ref 39.0–52.0)
HEMOGLOBIN: 14.9 g/dL (ref 13.0–17.0)
Lymphocytes Relative: 93 % — ABNORMAL HIGH (ref 12–46)
Lymphs Abs: 102.7 10*3/uL — ABNORMAL HIGH (ref 0.7–4.0)
MCH: 30.9 pg (ref 26.0–34.0)
MCHC: 32.9 g/dL (ref 30.0–36.0)
MCV: 94 fL (ref 78.0–100.0)
MONO ABS: 1.9 10*3/uL — AB (ref 0.1–1.0)
MONOS PCT: 2 % — AB (ref 3–12)
Neutro Abs: 5.9 10*3/uL (ref 1.7–7.7)
Neutrophils Relative %: 5 % — ABNORMAL LOW (ref 43–77)
Platelets: 180 10*3/uL (ref 150–400)
RBC: 4.82 MIL/uL (ref 4.22–5.81)
RDW: 13.6 % (ref 11.5–15.5)
WBC: 111 10*3/uL (ref 4.0–10.5)

## 2013-04-05 LAB — COMPREHENSIVE METABOLIC PANEL
ALT: 15 U/L (ref 0–53)
AST: 20 U/L (ref 0–37)
Albumin: 4.2 g/dL (ref 3.5–5.2)
Alkaline Phosphatase: 132 U/L — ABNORMAL HIGH (ref 39–117)
BUN: 15 mg/dL (ref 6–23)
CALCIUM: 9.4 mg/dL (ref 8.4–10.5)
CO2: 27 meq/L (ref 19–32)
CREATININE: 0.93 mg/dL (ref 0.50–1.35)
Chloride: 104 mEq/L (ref 96–112)
GFR calc non Af Amer: 89 mL/min — ABNORMAL LOW (ref >90)
GLUCOSE: 101 mg/dL — AB (ref 70–99)
Potassium: 4 mEq/L (ref 3.7–5.3)
Sodium: 141 mEq/L (ref 137–147)
Total Bilirubin: 0.5 mg/dL (ref 0.3–1.2)
Total Protein: 6.8 g/dL (ref 6.0–8.3)

## 2013-04-05 LAB — LACTATE DEHYDROGENASE: LDH: 174 U/L (ref 94–250)

## 2013-04-05 LAB — SEDIMENTATION RATE: SED RATE: 2 mm/h (ref 0–16)

## 2013-04-05 NOTE — Progress Notes (Signed)
CRITICAL VALUE ALERT  Critical value received:  WBC 111,000 Date of notification:  04/05/13 Time of notification:  12:25  Critical value read back:yes  Nurse who received alert:  Lupita Raider RN Forwarded to Dr. Barnet Glasgow.

## 2013-04-05 NOTE — Progress Notes (Signed)
Labs drawn today for b23mic,retic,ldh,sed rate,cbc/diff,cmp

## 2013-04-06 LAB — BETA 2 MICROGLOBULIN, SERUM: Beta-2 Microglobulin: 1.7 mg/L (ref 1.01–1.73)

## 2013-04-09 ENCOUNTER — Other Ambulatory Visit (HOSPITAL_COMMUNITY): Payer: Medicare Other

## 2013-04-11 NOTE — Progress Notes (Signed)
[name]., MD [address] [place] [address]  Chronic lymphocytic leukemia  CURRENT THERAPY:Observation  INTERVAL HISTORY: Alfred Brown 63 y.o. male returns for  regular  visit for followup of CLL (Rai Stage I with enlarged inguinal nodes, no hepatosplenomegaly, normal Hgb and platelet count) presenting in November 2004, not requiring therapeutic intervention.  AND  Hypogammaglobulinemia.  I personally reviewed and went over laboratory results with the patient.  Lab results follow below.  Labs are stable.  NCCN guidelines for indication for treatment of CLL are:  A. Eligible for clinical trial  B. Significant disease-related symptoms   1. Fatigue (severe)   2. Night sweats   3. Weight loss   4. Fever without infection  C. Threatened end-organ function  D. Progressive bulky disease (spleen >6cm below costal margin, lymph nodes >10 cm)  E. Progressive anemia  F. Progressive thrombocytopenia.  [name] has two complaints: 1. "Bump in mouth."  He reports that his dentist has seen these in the past.  He notes that they have been there for a "long time" but he does not quantify.  He reports that they do not hurt, but he can "feel them."  On physical exam, swelling of the ducts of submandibular gland bilaterally.  I have recommended that he follow-up with ENT.   2. Tenderness of "tailbone."  He reports that sitting causes increased pain with episodes of intense, fleeting pain.  He underwent an xray on 02/01/2013 which was negative.  I recommend OTC Aleve of Ibuprofen for this discomfort.   He denies any rash, blood in stool, black tarry stool.  Hematologically, he denies any complaints and ROS questioning is negative.   Past Medical History  Diagnosis Date  . CLL (chronic lymphocytic leukemia)   . COPD (chronic obstructive pulmonary disease)   . CMV (cytomegalovirus)   . Depression with anxiety   . DJD (degenerative joint disease)   . Lipoma       left chest wall  . Right bundle branch block     + left posterior fascicular block  . GERD (gastroesophageal reflux disease)   . Hyperlipidemia   . Palpitations 2004    PVCs; borderline stress nuclear in 2004, negative in 2007; Echo 2007; AV-sclerotic, very mild AI  . Aortic valve disorder     Mild insufficiency  . Tobacco abuse     80 pack years    has GERD (gastroesophageal reflux disease); Hyperlipidemia; Palpitations; Chronic lymphocytic leukemia; Aortic valve disorder; Right bundle branch block; and Tobacco abuse on his problem list.     has No Known Allergies.  [name] does not currently have medications on file.  Past Surgical History  Procedure Laterality Date  . Rotator cuff repair  1997    right  . Ganglion cyst excision  1997    left wrist  . Colonoscopy  01/2012    Negative screening study  . Shoulder arthroscopy with subacromial decompression  04/09/2012    Procedure: SHOULDER ARTHROSCOPY WITH SUBACROMIAL DECOMPRESSION;  Surgeon: Ninetta Lights, MD;  Location: [place];  Service: Orthopedics;  Laterality: Left;  LEFT SHOULDER ARTHROSCOPY, SUBACROMIAL DECOMPRESSION, PARTIAL ACROMIOPLASTY WITH CORACOACROMIAL RELEASE, DISTAL CLAVICULECTOMY WITH ROTATOR CUFF REPAIR, DEBRIDEMENT OF LABRUM    Denies any headaches, dizziness, double vision, fevers, chills, night sweats, nausea, vomiting, diarrhea, constipation, chest pain, heart palpitations, shortness of breath, blood in stool, black tarry stool, urinary pain, urinary burning, urinary frequency, hematuria.   PHYSICAL EXAMINATION  ECOG PERFORMANCE STATUS: 0 - Asymptomatic  Filed Vitals:   04/12/13 1015  BP: 140/80  Pulse: 98  Temp: 97.5 F (36.4 C)  Resp: 16    GENERAL:alert, healthy, no distress, well nourished, well developed, comfortable, cooperative and smiling SKIN: skin color, texture, turgor are normal, no rashes or significant lesions HEAD: Normocephalic, No masses, lesions,  tenderness or abnormalities EYES: normal, PERRLA, EOMI, Conjunctiva are pink and non-injected EARS: External ears normal OROPHARYNX:lips, buccal mucosa, and tongue normal, mucous membranes are moist and lateral to frenulum linguae, bilaterally, is edema/enlargement of ducts of submandibular gland.  No erythema or open areas noted.  NECK: supple, no adenopathy, thyroid normal size, non-tender, without nodularity, no stridor, non-tender, trachea midline LYMPH:  no hepatosplenomegaly, right and left inguinal lymph nodes measuring less than 1 cm in size.  BREAST:not examined LUNGS: clear to auscultation and percussion HEART: regular rate & rhythm, no murmurs, no gallops, S1 normal and S2 normal ABDOMEN:abdomen soft, non-tender, normal bowel sounds, no masses or organomegaly and no hepatosplenomegaly BACK: Back symmetric, no curvature., No CVA tenderness EXTREMITIES:less then 2 second capillary refill  NEURO: alert & oriented x 3 with fluent speech, no focal motor/sensory deficits, gait normal    LABORATORY DATA: CBC    Component Value Date/Time   WBC 111.0* 04/05/2013 1058   RBC 4.82 04/05/2013 1058   RBC 4.82 04/05/2013 1058   HGB 14.9 04/05/2013 1058   HCT 45.3 04/05/2013 1058   PLT 180 04/05/2013 1058   MCV 94.0 04/05/2013 1058   MCH 30.9 04/05/2013 1058   MCHC 32.9 04/05/2013 1058   RDW 13.6 04/05/2013 1058   LYMPHSABS 102.7* 04/05/2013 1058   MONOABS 1.9* 04/05/2013 1058   EOSABS 0.2 04/05/2013 1058   BASOSABS 0.3* 04/05/2013 1058      Chemistry      Component Value Date/Time   NA 141 04/05/2013 1058   K 4.0 04/05/2013 1058   CL 104 04/05/2013 1058   CO2 27 04/05/2013 1058   BUN 15 04/05/2013 1058   CREATININE 0.93 04/05/2013 1058      Component Value Date/Time   CALCIUM 9.4 04/05/2013 1058   ALKPHOS 132* 04/05/2013 1058   AST 20 04/05/2013 1058   ALT 15 04/05/2013 1058   BILITOT 0.5 04/05/2013 1058     Results for LYNFORD, ESPINOZA (MRN [id number]) as of 04/11/2013 19:20  Ref. Range 04/05/2013 10:58  LDH Latest  Range: 94-250 U/L 174   Results for SHAUNAK, KREIS (MRN [id number]) as of 04/11/2013 19:20  Ref. Range 04/05/2013 10:58  Beta-2 Microglobulin Latest Range: 1.01-1.73 mg/L 1.70     ASSESSMENT:  1. CLL presenting in November 2004, not requiring therapeutic intervention.  2. Hypogammaglobulinemia  3. COPD still smoking a pack a day  4. DJD of the left knee and left shoulder  5. Excessive alcohol use quitting in 1994  6. CMV positive antibody titer possibly response for his chronic weakness and fatigability versus tick bite in August 2004 treated with doxycycline the thought by some to be post tick bite syndrome causing his fatigue and weakness.  7. Chronic anxiety on alprazolam 8. Enlargement of ducts of submandibular gland bilaterally, recommend ENT follow-up with Dr. Harlow Mares. 9. Coccyx discomfort, negative xray  Patient Active Problem List   Diagnosis Date Noted  . GERD (gastroesophageal reflux disease)   . Hyperlipidemia   . Palpitations   . Chronic lymphocytic leukemia   . Aortic valve disorder   . Right bundle branch block   . Tobacco  abuse      PLAN:  1. I personally reviewed and went over laboratory results with the patient.  2. Labs in 3 months: CBC diff, CMET, LDH, ESR.  3. Patient education regarding NCCN guidelines regarding treatment.  4. Patient education regarding Rai staging of CLL  5. Patient education regarding NSAIDS for coccyx discomfort.  Recommend decreased insult and avoidance of direct pressure and weight on coccyx. 6. Patient education regarding duct of submandibular enlargement.  Recommend follow-up with ENT, Dr. Harlow Mares. 7. Follow-up with PCP as directed 8. Return in 3 months for follow-up   THERAPY PLAN:  NCCN guidelines for indication for treatment of CLL are:  A. Eligible for clinical trial  B. Significant disease-related symptoms   1. Fatigue (severe)   2. Night sweats   3. Weight loss   4. Fever without infection  C. Threatened end-organ  function  D. Progressive bulky disease (spleen >6cm below costal margin, lymph nodes >10 cm)  E. Progressive anemia  F. Progressive thrombocytopenia.   All questions were answered. The patient knows to call the clinic with any problems, questions or concerns. We can certainly see the patient much sooner if necessary.  Patient and plan discussed with Dr. Farrel Gobble and he is in agreement with the aforementioned.   KEFALAS,THOMAS

## 2013-04-12 ENCOUNTER — Encounter (HOSPITAL_BASED_OUTPATIENT_CLINIC_OR_DEPARTMENT_OTHER): Payer: Medicare Other | Admitting: Oncology

## 2013-04-12 ENCOUNTER — Encounter (HOSPITAL_COMMUNITY): Payer: Self-pay | Admitting: Oncology

## 2013-04-12 VITALS — BP 140/80 | HR 98 | Temp 97.5°F | Resp 16 | Wt 188.8 lb

## 2013-04-12 DIAGNOSIS — D801 Nonfamilial hypogammaglobulinemia: Secondary | ICD-10-CM

## 2013-04-12 DIAGNOSIS — C9110 Chronic lymphocytic leukemia of B-cell type not having achieved remission: Secondary | ICD-10-CM

## 2013-04-12 DIAGNOSIS — F17200 Nicotine dependence, unspecified, uncomplicated: Secondary | ICD-10-CM

## 2013-04-12 NOTE — Patient Instructions (Signed)
[place] Discharge Instructions  RECOMMENDATIONS MADE BY THE CONSULTANT AND ANY TEST RESULTS WILL BE SENT TO YOUR REFERRING PHYSICIAN.  EXAM FINDINGS BY THE PHYSICIAN TODAY AND SIGNS OR SYMPTOMS TO REPORT TO CLINIC OR PRIMARY PHYSICIAN: you seen Alfred Brown today   INSTRUCTIONS GIVEN AND DISCUSSED: Please follow up with your ENT if the mouth sore continues to bother you.  You can take alieve or ibuprofen for your coccyx pain as needed.  You will follow up in 3 months for lab work and to see the doctor.    Thank you for choosing [place] to provide your oncology and hematology care.  To afford each patient quality time with our providers, please arrive at least 15 minutes before your scheduled appointment time.  With your help, our goal is to use those 15 minutes to complete the necessary work-up to ensure our physicians have the information they need to help with your evaluation and healthcare recommendations.    Effective January 1st, 2014, we ask that you re-schedule your appointment with our physicians should you arrive 10 or more minutes late for your appointment.  We strive to give you quality time with our providers, and arriving late affects you and other patients whose appointments are after yours.    Again, thank you for choosing [facility].  Our hope is that these requests will decrease the amount of time that you wait before being seen by our physicians.       _____________________________________________________________  Should you have questions after your visit to [facility], please contact our office at [phone] between the hours of 8:30 a.m. and 5:00 p.m.  Voicemails left after 4:30 p.m. will not be returned until the following business day.  For prescription refill requests, have your pharmacy contact our office with your prescription refill request.

## 2013-04-13 ENCOUNTER — Ambulatory Visit (HOSPITAL_COMMUNITY): Payer: Medicare Other | Admitting: Oncology

## 2013-05-05 DIAGNOSIS — Z719 Counseling, unspecified: Secondary | ICD-10-CM | POA: Diagnosis not present

## 2013-05-05 DIAGNOSIS — J019 Acute sinusitis, unspecified: Secondary | ICD-10-CM | POA: Diagnosis not present

## 2013-05-05 DIAGNOSIS — J069 Acute upper respiratory infection, unspecified: Secondary | ICD-10-CM | POA: Diagnosis not present

## 2013-05-05 DIAGNOSIS — Z6825 Body mass index (BMI) 25.0-25.9, adult: Secondary | ICD-10-CM | POA: Diagnosis not present

## 2013-06-29 ENCOUNTER — Other Ambulatory Visit (HOSPITAL_COMMUNITY): Payer: Self-pay | Admitting: Internal Medicine

## 2013-06-29 DIAGNOSIS — J0100 Acute maxillary sinusitis, unspecified: Secondary | ICD-10-CM | POA: Diagnosis not present

## 2013-06-29 DIAGNOSIS — L219 Seborrheic dermatitis, unspecified: Secondary | ICD-10-CM | POA: Diagnosis not present

## 2013-06-29 DIAGNOSIS — R52 Pain, unspecified: Secondary | ICD-10-CM

## 2013-06-29 DIAGNOSIS — M533 Sacrococcygeal disorders, not elsewhere classified: Secondary | ICD-10-CM

## 2013-06-29 DIAGNOSIS — C9110 Chronic lymphocytic leukemia of B-cell type not having achieved remission: Secondary | ICD-10-CM

## 2013-06-29 DIAGNOSIS — J449 Chronic obstructive pulmonary disease, unspecified: Secondary | ICD-10-CM | POA: Diagnosis not present

## 2013-06-29 DIAGNOSIS — IMO0002 Reserved for concepts with insufficient information to code with codable children: Secondary | ICD-10-CM | POA: Diagnosis not present

## 2013-07-01 ENCOUNTER — Other Ambulatory Visit (HOSPITAL_COMMUNITY): Payer: Medicare Other

## 2013-07-05 ENCOUNTER — Telehealth (HOSPITAL_COMMUNITY): Payer: Self-pay | Admitting: *Deleted

## 2013-07-05 ENCOUNTER — Other Ambulatory Visit (HOSPITAL_COMMUNITY): Payer: Medicare Other

## 2013-07-05 ENCOUNTER — Ambulatory Visit (HOSPITAL_COMMUNITY)
Admission: RE | Admit: 2013-07-05 | Discharge: 2013-07-05 | Disposition: A | Discharge status: Home / Self Care | Payer: Medicare Other | Source: Ambulatory Visit | Attending: Internal Medicine | Admitting: Internal Medicine

## 2013-07-05 ENCOUNTER — Encounter (HOSPITAL_COMMUNITY): Payer: Medicare Other | Attending: Internal Medicine

## 2013-07-05 DIAGNOSIS — C9110 Chronic lymphocytic leukemia of B-cell type not having achieved remission: Secondary | ICD-10-CM

## 2013-07-05 DIAGNOSIS — M533 Sacrococcygeal disorders, not elsewhere classified: Secondary | ICD-10-CM

## 2013-07-05 DIAGNOSIS — M5137 Other intervertebral disc degeneration, lumbosacral region: Secondary | ICD-10-CM | POA: Diagnosis not present

## 2013-07-05 DIAGNOSIS — M51379 Other intervertebral disc degeneration, lumbosacral region without mention of lumbar back pain or lower extremity pain: Secondary | ICD-10-CM | POA: Insufficient documentation

## 2013-07-05 LAB — COMPREHENSIVE METABOLIC PANEL
ALK PHOS: 129 U/L — AB (ref 39–117)
ALT: 15 U/L (ref 0–53)
AST: 19 U/L (ref 0–37)
Albumin: 4.2 g/dL (ref 3.5–5.2)
BILIRUBIN TOTAL: 0.5 mg/dL (ref 0.3–1.2)
BUN: 18 mg/dL (ref 6–23)
CHLORIDE: 103 meq/L (ref 96–112)
CO2: 25 mEq/L (ref 19–32)
Calcium: 9.5 mg/dL (ref 8.4–10.5)
Creatinine, Ser: 0.87 mg/dL (ref 0.50–1.35)
GLUCOSE: 117 mg/dL — AB (ref 70–99)
POTASSIUM: 4.2 meq/L (ref 3.7–5.3)
Sodium: 141 mEq/L (ref 137–147)
Total Protein: 6.9 g/dL (ref 6.0–8.3)

## 2013-07-05 LAB — CBC WITH DIFFERENTIAL/PLATELET
BASOS PCT: 0 % (ref 0–1)
Basophils Absolute: 0 10*3/uL (ref 0.0–0.1)
Eosinophils Absolute: 0 10*3/uL (ref 0.0–0.7)
Eosinophils Relative: 0 % (ref 0–5)
HEMATOCRIT: 46.4 % (ref 39.0–52.0)
Hemoglobin: 14.8 g/dL (ref 13.0–17.0)
Lymphocytes Relative: 92 % — ABNORMAL HIGH (ref 12–46)
Lymphs Abs: 118.7 10*3/uL — ABNORMAL HIGH (ref 0.7–4.0)
MCH: 30.1 pg (ref 26.0–34.0)
MCHC: 31.9 g/dL (ref 30.0–36.0)
MCV: 94.5 fL (ref 78.0–100.0)
MONOS PCT: 2 % — AB (ref 3–12)
Monocytes Absolute: 2.6 10*3/uL — ABNORMAL HIGH (ref 0.1–1.0)
NEUTROS ABS: 7.7 10*3/uL (ref 1.7–7.7)
Neutrophils Relative %: 6 % — ABNORMAL LOW (ref 43–77)
Platelets: 197 10*3/uL (ref 150–400)
RBC: 4.91 MIL/uL (ref 4.22–5.81)
RDW: 13.9 % (ref 11.5–15.5)
WBC: 129 10*3/uL — AB (ref 4.0–10.5)

## 2013-07-05 LAB — SEDIMENTATION RATE: SED RATE: 2 mm/h (ref 0–16)

## 2013-07-05 LAB — LACTATE DEHYDROGENASE: LDH: 199 U/L (ref 94–250)

## 2013-07-05 NOTE — Progress Notes (Signed)
Blood drawn from [place], [place], 23 gauge needle, from right ac. Pt tolerated well.

## 2013-07-05 NOTE — Telephone Encounter (Signed)
..  CRITICAL VALUE ALERT Critical value received:  Wbc 129,000  Date of notification:  07/05/2013 Time of notification: 2353 Critical value read back:  yes Nurse who received alert:  TAR MD notified (1st page):  DR. Barnet Glasgow

## 2013-07-12 ENCOUNTER — Encounter (HOSPITAL_BASED_OUTPATIENT_CLINIC_OR_DEPARTMENT_OTHER): Payer: Medicare Other

## 2013-07-12 ENCOUNTER — Encounter (HOSPITAL_COMMUNITY): Payer: Self-pay

## 2013-07-12 VITALS — BP 148/72 | HR 86 | Temp 98.0°F | Resp 18 | Wt 186.1 lb

## 2013-07-12 DIAGNOSIS — C9110 Chronic lymphocytic leukemia of B-cell type not having achieved remission: Secondary | ICD-10-CM

## 2013-07-12 DIAGNOSIS — D801 Nonfamilial hypogammaglobulinemia: Secondary | ICD-10-CM | POA: Diagnosis not present

## 2013-07-12 DIAGNOSIS — J449 Chronic obstructive pulmonary disease, unspecified: Secondary | ICD-10-CM | POA: Diagnosis not present

## 2013-07-12 DIAGNOSIS — S322XXA Fracture of coccyx, initial encounter for closed fracture: Secondary | ICD-10-CM

## 2013-07-12 NOTE — Patient Instructions (Signed)
[place] Discharge Instructions  RECOMMENDATIONS MADE BY THE CONSULTANT AND ANY TEST RESULTS WILL BE SENT TO YOUR REFERRING PHYSICIAN. We will see you in 4 months for repeat lab work and a doctor visit.  Thank you for choosing [place] to provide your oncology and hematology care.  To afford each patient quality time with our providers, please arrive at least 15 minutes before your scheduled appointment time.  With your help, our goal is to use those 15 minutes to complete the necessary work-up to ensure our physicians have the information they need to help with your evaluation and healthcare recommendations.    Effective January 1st, 2014, we ask that you re-schedule your appointment with our physicians should you arrive 10 or more minutes late for your appointment.  We strive to give you quality time with our providers, and arriving late affects you and other patients whose appointments are after yours.    Again, thank you for choosing [facility].  Our hope is that these requests will decrease the amount of time that you wait before being seen by our physicians.       _____________________________________________________________  Should you have questions after your visit to [facility], please contact our office at [phone] between the hours of 8:30 a.m. and 5:00 p.m.  Voicemails left after 4:30 p.m. will not be returned until the following business day.  For prescription refill requests, have your pharmacy contact our office with your prescription refill request.

## 2013-07-12 NOTE — Progress Notes (Signed)
[facility]  OFFICE PROGRESS NOTE  [name]., MD [address] [place] [address]  DIAGNOSIS: Chronic lymphocytic leukemia  Fractured coccyx  Chief Complaint  Patient presents with  . Chronic lymphocytic leukemia    CURRENT THERAPY: Watchful expectation and surveillance  INTERVAL HISTORY: Alfred Brown 63 y.o. male returns for followup of stage I chronic lymphocytic leukemia with enlarged lymph nodes and elevated white blood cell count known since November of 2004, never having received treatment. He also manifests hypogammaglobulinemia. He has developed pain in the lower portion of his back and was found to have a coccygeal fracture without a recent history of trauma. 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 He denies any fever, night sweats, but does have easy satiety without skin rash, other joint pain, headache, or seizures. He does have nasal drip in the springtime with chronic cough without expectoration. He has had episodes of sinusitis since December of 2014. Appetite is good with no diarrhea, constipation, hematuria, dark urine, melena, hematochezia, epistaxis, or hemoptysis. His primary complaint is fatigue.  MEDICAL HISTORY: Past Medical History  Diagnosis Date  . CLL (chronic lymphocytic leukemia)   . COPD (chronic obstructive pulmonary disease)   . CMV (cytomegalovirus)   . Depression with anxiety   . DJD (degenerative joint disease)   . Lipoma     left chest wall  . Right bundle branch block     + left posterior fascicular block  . GERD (gastroesophageal reflux disease)   . Hyperlipidemia   . Palpitations 2004    PVCs; borderline stress nuclear in 2004, negative in 2007; Echo 2007; AV-sclerotic, very mild AI  . Aortic valve disorder     Mild insufficiency  . Tobacco abuse     80 pack years    INTERIM HISTORY: has GERD (gastroesophageal reflux  disease); Hyperlipidemia; Palpitations; Chronic lymphocytic leukemia; Aortic valve disorder; Right bundle branch block; and Tobacco abuse on his problem list.   NCCN guidelines for indication for treatment of CLL are:  A. Eligible for clinical trial  B. Significant disease-related symptoms  1. Fatigue (severe)  2. Night sweats  3. Weight loss  4. Fever without infection  C. Threatened end-organ function  D. Progressive bulky disease (spleen >6cm below costal margin, lymph nodes >10 cm)  E. Progressive anemia  F. Progressive thrombocytopenia  ALLERGIES:  has No Known Allergies.  MEDICATIONS: has a current medication list which includes the following prescription(s): acyclovir, albuterol, alprazolam, aspirin, bupropion, calcium carbonate, diphenhydramine-pe-apap, fish oil-omega-3 fatty acids, fluticasone, hydrocodone-acetaminophen, ibuprofen, loratadine, methocarbamol, multivitamin, naproxen, niacin-lovastatin, omeprazole, and amoxicillin.  SURGICAL HISTORY:  Past Surgical History  Procedure Laterality Date  . Rotator cuff repair  1997    right  . Ganglion cyst excision  1997    left wrist  . Colonoscopy  01/2012    Negative screening study  . Shoulder arthroscopy with subacromial decompression  04/09/2012    Procedure: SHOULDER ARTHROSCOPY WITH SUBACROMIAL DECOMPRESSION;  Surgeon: Ninetta Lights, MD;  Location: [facility];  Service: Orthopedics;  Laterality: Left;  LEFT SHOULDER ARTHROSCOPY, SUBACROMIAL DECOMPRESSION, PARTIAL ACROMIOPLASTY WITH CORACOACROMIAL RELEASE, DISTAL CLAVICULECTOMY WITH ROTATOR CUFF REPAIR, DEBRIDEMENT OF LABRUM    FAMILY HISTORY: family history includes Cancer in his brother and sister; Colon cancer in his sister; Heart attack in his father; Stroke in his mother.  SOCIAL HISTORY:  reports that he has been  smoking Cigarettes.  He has a 45 pack-year smoking history. He has never used smokeless tobacco. He reports that he does not drink alcohol or  use illicit drugs.  REVIEW OF SYSTEMS:  Other than that discussed above is noncontributory.  PHYSICAL EXAMINATION: ECOG PERFORMANCE STATUS: 1 - Symptomatic but completely ambulatory  Blood pressure 148/72, pulse 86, temperature 98 F (36.7 C), temperature source Oral, resp. rate 18, weight 186 lb 1.6 oz (84.414 kg), SpO2 97.00%.  GENERAL:alert, no distress and comfortable SKIN: skin color, texture, turgor are normal, no rashes or significant lesions EYES: PERLA; Conjunctiva are pink and non-injected, sclera clear SINUSES: No redness or tenderness over maxillary or ethmoid sinuses. Bilateral rhinorrhea with no sinus tenderness OROPHARYNX:no exudate, no erythema on lips, buccal mucosa, or tongue. NECK: supple, thyroid normal size, non-tender, without nodularity. No masses CHEST: Increased AP diameter with no gynecomastia. LYMPH:  no palpable lymphadenopathy in the cervical, axillary or inguinal LUNGS: clear to auscultation and percussion with normal breathing effort HEART: regular rate & rhythm and no murmurs. ABDOMEN:abdomen soft, non-tender and normal bowel sounds. Spleen not palpable. MUSCULOSKELETAL:no cyanosis of digits and no clubbing. Range of motion normal.  NEURO: alert & oriented x 3 with fluent speech, no focal motor/sensory deficits   LABORATORY DATA: Infusion on 07/05/2013  Component Date Value Ref Range Status  . WBC 07/05/2013 129.0* 4.0 - 10.5 K/uL Final   Comment: CRITICAL RESULT CALLED TO, READ BACK BY AND VERIFIED WITH:                          ROBERTSON T. AT 1541 ON [id number] BY THOMPSON S.  . RBC 07/05/2013 4.91  4.22 - 5.81 MIL/uL Final  . Hemoglobin 07/05/2013 14.8  13.0 - 17.0 g/dL Final  . HCT 07/05/2013 46.4  39.0 - 52.0 % Final  . MCV 07/05/2013 94.5  78.0 - 100.0 fL Final  . MCH 07/05/2013 30.1  26.0 - 34.0 pg Final  . MCHC 07/05/2013 31.9  30.0 - 36.0 g/dL Final  . RDW 07/05/2013 13.9  11.5 - 15.5 % Final  . Platelets 07/05/2013 197  150 - 400 K/uL Final   . Neutrophils Relative % 07/05/2013 6* 43 - 77 % Final  . Lymphocytes Relative 07/05/2013 92* 12 - 46 % Final  . Monocytes Relative 07/05/2013 2* 3 - 12 % Final  . Eosinophils Relative 07/05/2013 0  0 - 5 % Final  . Basophils Relative 07/05/2013 0  0 - 1 % Final  . Neutro Abs 07/05/2013 7.7  1.7 - 7.7 K/uL Final  . Lymphs Abs 07/05/2013 118.7* 0.7 - 4.0 K/uL Final  . Monocytes Absolute 07/05/2013 2.6* 0.1 - 1.0 K/uL Final  . Eosinophils Absolute 07/05/2013 0.0  0.0 - 0.7 K/uL Final  . Basophils Absolute 07/05/2013 0.0  0.0 - 0.1 K/uL Final  . WBC Morphology 07/05/2013 ATYPICAL LYMPHOCYTES   Final   Comment: ABSOLUTE LYMPHOCYTOSIS                          SMUDGE CELLS                          TOXIC GRANULATION  . Sodium 07/05/2013 141  137 - 147 mEq/L Final  . Potassium 07/05/2013 4.2  3.7 - 5.3 mEq/L Final  . Chloride 07/05/2013 103  96 - 112 mEq/L Final  . CO2 07/05/2013 25  19 - 32  mEq/L Final  . Glucose, Bld 07/05/2013 117* 70 - 99 mg/dL Final  . BUN 07/05/2013 18  6 - 23 mg/dL Final  . Creatinine, Ser 07/05/2013 0.87  0.50 - 1.35 mg/dL Final  . Calcium 07/05/2013 9.5  8.4 - 10.5 mg/dL Final  . Total Protein 07/05/2013 6.9  6.0 - 8.3 g/dL Final  . Albumin 07/05/2013 4.2  3.5 - 5.2 g/dL Final  . AST 07/05/2013 19  0 - 37 U/L Final  . ALT 07/05/2013 15  0 - 53 U/L Final  . Alkaline Phosphatase 07/05/2013 129* 39 - 117 U/L Final  . Total Bilirubin 07/05/2013 0.5  0.3 - 1.2 mg/dL Final  . GFR calc non Af Amer 07/05/2013 >90  >90 mL/min Final  . GFR calc Af Amer 07/05/2013 >90  >90 mL/min Final   Comment: (NOTE)                          The eGFR has been calculated using the CKD EPI equation.                          This calculation has not been validated in all clinical situations.                          eGFR's persistently <90 mL/min signify possible Chronic Kidney                          Disease.  [name] LDH 07/05/2013 199  94 - 250 U/L Final  . Sed Rate 07/05/2013 2  0 - 16  mm/hr Final    PATHOLOGY: Peripheral smear shows smudge cells without premature forms.  Urinalysis No results found for this basename: colorurine, appearanceur, labspec, phurine, glucoseu, hgbur, bilirubinur, ketonesur, proteinur, urobilinogen, nitrite, leukocytesur    RADIOGRAPHIC STUDIES: Ct Pelvis Wo Contrast  07/05/2013   CLINICAL DATA:  Intermittent coccyx pain for 1 year.  EXAM: CT PELVIS WITHOUT CONTRAST  TECHNIQUE: Multidetector CT imaging of the pelvis was performed following the standard protocol without intravenous contrast.  COMPARISON:  DG SACRUM/COCCYX dated 02/01/2013  FINDINGS: Slight cortical irregularity noted in the lower coccyx segment. This could reflect old healed injury. This was not visible on prior plain films. No evidence of acute fracture. No focal bone lesion. Degenerative disc disease at L5-S1 with disc space narrowing, vacuum disc and spurring.  Fusion across the right SI joint. Left SI joint this unremarkable. Bilateral hip joints symmetric and unremarkable.  Scattered descending colonic and sigmoid diverticula. No active diverticulitis. Small bowel is decompressed. No free fluid, free air or adenopathy. Urinary bladder and prostate grossly unremarkable.  Aortic and iliac calcifications without aneurysm.  IMPRESSION: Slight irregularity of the lower coccygeal segment which may reflect old healed injury. No acute fracture or focal bone lesion.  Degenerative disc disease at L5-S1.  Left colonic diverticulosis.   Electronically Signed   By: Rolm Baptise M.D.   On: 07/05/2013 15:41    ASSESSMENT:  #1. Stage I chronic lymphocytic leukemia, known since November 2004, no indication for treatment this time with primary symptom being fatigued. #2. Hypogammaglobulinemia. #3. Chronic obstructive pulmonary disease. #4. Old fracture of the coccyx, currently symptomatic. #5. History of alcohol abuse, quitting in 1994.  PLAN:  #1. Patient already has a donut to sit on at home. We  suggested he get metatarsal pads to place  at the coccyx area for further buffering. #2. Patient was instructed to call should he develop worsening fatigue, fever, or dark urine. #3. Followup in 4 months with CBC, chem profile, reticulocyte count, LDH, and beta-2 microglobulin.   All questions were answered. The patient knows to call the clinic with any problems, questions or concerns. We can certainly see the patient much sooner if necessary.   I spent 25 minutes counseling the patient face to face. The total time spent in the appointment was 30 minutes.    Farrel Gobble, MD 07/12/2013 10:41 AM

## 2013-10-01 ENCOUNTER — Emergency Department (HOSPITAL_COMMUNITY)
Admission: EM | Admit: 2013-10-01 | Discharge: 2013-10-01 | Disposition: A | Discharge status: Home / Self Care | Payer: Medicare Other | Attending: Emergency Medicine | Admitting: Emergency Medicine

## 2013-10-01 ENCOUNTER — Encounter (HOSPITAL_COMMUNITY): Payer: Self-pay | Admitting: Emergency Medicine

## 2013-10-01 DIAGNOSIS — J4489 Other specified chronic obstructive pulmonary disease: Secondary | ICD-10-CM | POA: Insufficient documentation

## 2013-10-01 DIAGNOSIS — Z872 Personal history of diseases of the skin and subcutaneous tissue: Secondary | ICD-10-CM | POA: Insufficient documentation

## 2013-10-01 DIAGNOSIS — Z8619 Personal history of other infectious and parasitic diseases: Secondary | ICD-10-CM | POA: Insufficient documentation

## 2013-10-01 DIAGNOSIS — Z7982 Long term (current) use of aspirin: Secondary | ICD-10-CM | POA: Diagnosis not present

## 2013-10-01 DIAGNOSIS — H01009 Unspecified blepharitis unspecified eye, unspecified eyelid: Secondary | ICD-10-CM | POA: Diagnosis not present

## 2013-10-01 DIAGNOSIS — Z79899 Other long term (current) drug therapy: Secondary | ICD-10-CM | POA: Diagnosis not present

## 2013-10-01 DIAGNOSIS — E785 Hyperlipidemia, unspecified: Secondary | ICD-10-CM | POA: Insufficient documentation

## 2013-10-01 DIAGNOSIS — H5789 Other specified disorders of eye and adnexa: Secondary | ICD-10-CM | POA: Diagnosis not present

## 2013-10-01 DIAGNOSIS — IMO0002 Reserved for concepts with insufficient information to code with codable children: Secondary | ICD-10-CM | POA: Diagnosis not present

## 2013-10-01 DIAGNOSIS — F17200 Nicotine dependence, unspecified, uncomplicated: Secondary | ICD-10-CM | POA: Insufficient documentation

## 2013-10-01 DIAGNOSIS — H01003 Unspecified blepharitis right eye, unspecified eyelid: Secondary | ICD-10-CM

## 2013-10-01 DIAGNOSIS — Z856 Personal history of leukemia: Secondary | ICD-10-CM | POA: Insufficient documentation

## 2013-10-01 DIAGNOSIS — F341 Dysthymic disorder: Secondary | ICD-10-CM | POA: Diagnosis not present

## 2013-10-01 DIAGNOSIS — Z791 Long term (current) use of non-steroidal anti-inflammatories (NSAID): Secondary | ICD-10-CM | POA: Diagnosis not present

## 2013-10-01 DIAGNOSIS — J449 Chronic obstructive pulmonary disease, unspecified: Secondary | ICD-10-CM | POA: Insufficient documentation

## 2013-10-01 MED ORDER — ERYTHROMYCIN 5 MG/GM OP OINT
TOPICAL_OINTMENT | Freq: Three times a day (TID) | OPHTHALMIC | Status: DC
Start: 2013-10-01 — End: 2013-10-01
  Administered 2013-10-01: 15:00:00 via OPHTHALMIC
  Filled 2013-10-01: qty 3.5

## 2013-10-01 MED ORDER — FLUORESCEIN SODIUM 1 MG OP STRP
1.0000 | ORAL_STRIP | Freq: Once | OPHTHALMIC | Status: AC
  Administered 2013-10-01: 1 via OPHTHALMIC
  Filled 2013-10-01: qty 1

## 2013-10-01 MED ORDER — TETRACAINE HCL 0.5 % OP SOLN
2.0000 [drp] | Freq: Once | OPHTHALMIC | Status: AC
  Administered 2013-10-01: 2 [drp] via OPHTHALMIC
  Filled 2013-10-01: qty 2

## 2013-10-01 NOTE — ED Provider Notes (Signed)
CSN: [id number]     Arrival date & time 10/01/13  1208 History   First MD Initiated Contact with Patient 10/01/13 1346     Chief Complaint  Patient presents with  . Eye Pain     Patient is a 63 y.o. male presenting with eye pain. The history is provided by the patient.  Eye Pain This is a new problem. The current episode started 2 days ago. The problem occurs constantly. The problem has not changed since onset.Pertinent negatives include no headaches. Nothing aggravates the symptoms. Nothing relieves the symptoms.  pt reports two days ago he noticed mild swelling to right eye, he has "crusty" drainage and also minimal pain No trauma to eye Feels like "Something is in there" He does not wear contact lenses.  No h/o eye surgery No visual loss   He has h/o CLL (not on current chemo) He reports he gets frequent rashes throughout body.  Recent "herpetic" sore to right mouth but other new rashes to his face or eye region  Past Medical History  Diagnosis Date  . CLL (chronic lymphocytic leukemia)   . COPD (chronic obstructive pulmonary disease)   . CMV (cytomegalovirus)   . Depression with anxiety   . DJD (degenerative joint disease)   . Lipoma     left chest wall  . Right bundle branch block     + left posterior fascicular block  . GERD (gastroesophageal reflux disease)   . Hyperlipidemia   . Palpitations 2004    PVCs; borderline stress nuclear in 2004, negative in 2007; Echo 2007; AV-sclerotic, very mild AI  . Aortic valve disorder     Mild insufficiency  . Tobacco abuse     80 pack years   Past Surgical History  Procedure Laterality Date  . Rotator cuff repair  1997    right  . Ganglion cyst excision  1997    left wrist  . Colonoscopy  01/2012    Negative screening study  . Shoulder arthroscopy with subacromial decompression  04/09/2012    Procedure: SHOULDER ARTHROSCOPY WITH SUBACROMIAL DECOMPRESSION;  Surgeon: Ninetta Lights, MD;  Location: [facility];   Service: Orthopedics;  Laterality: Left;  LEFT SHOULDER ARTHROSCOPY, SUBACROMIAL DECOMPRESSION, PARTIAL ACROMIOPLASTY WITH CORACOACROMIAL RELEASE, DISTAL CLAVICULECTOMY WITH ROTATOR CUFF REPAIR, DEBRIDEMENT OF LABRUM   Family History  Problem Relation Age of Onset  . Stroke Mother   . Heart attack Father   . Cancer Sister     colon  . Colon cancer Sister   . Cancer Brother     2 brothers died with lung cancer   History  Substance Use Topics  . Smoking status: Current Every Day Smoker -- 1.00 packs/day for 45 years    Types: Cigarettes  . Smokeless tobacco: Never Used     Comment: Consumption decreased approximately 2010 to one pack per day  . Alcohol Use: No    Review of Systems  Constitutional: Negative for fever.  Eyes: Positive for pain, discharge and itching. Negative for photophobia.  Neurological: Negative for headaches.      Allergies  Review of patient's allergies indicates no known allergies.  Home Medications   Prior to Admission medications   Medication Sig Start Date End Date Taking? Authorizing Provider  albuterol (PROVENTIL HFA;VENTOLIN HFA) 108 (90 BASE) MCG/ACT inhaler Inhale 2 puffs into the lungs every 6 (six) hours as needed.   Yes Historical Provider, MD  ALPRAZolam Duanne Moron) 1 MG tablet Take 1 mg by mouth 3 (  three) times daily as needed for anxiety.    Yes Historical Provider, MD  aspirin 325 MG tablet Take 325 mg by mouth daily.     Yes Historical Provider, MD  buPROPion (WELLBUTRIN SR) 150 MG 12 hr tablet Take 150 mg by mouth daily.    Yes Historical Provider, MD  calcium carbonate (OS-CAL) 600 MG TABS Take 600 mg by mouth daily.     Yes Historical Provider, MD  Diphenhydramine-PE-APAP 12.5-5-325 MG TABS Take 1 tablet by mouth as needed (sinus). sinus   Yes Historical Provider, MD  fish oil-omega-3 fatty acids 1000 MG capsule Take 2 g by mouth daily.     Yes Historical Provider, MD  fluticasone (FLONASE) 50 MCG/ACT nasal spray Place 2 sprays into the  nose daily.     Yes Historical Provider, MD  Loratadine 10 MG CAPS Take 10 mg by mouth daily.    Yes Historical Provider, MD  Multiple Vitamin (MULTIVITAMIN) tablet Take 1 tablet by mouth daily.     Yes Historical Provider, MD  naproxen (NAPROSYN) 500 MG tablet 500 mg as needed for mild pain or moderate pain.  12/28/12  Yes Historical Provider, MD  niacin-lovastatin (ADVICOR) 500-20 MG 24 hr tablet Take 1 tablet by mouth at bedtime.     Yes Historical Provider, MD  omeprazole (PRILOSEC) 20 MG capsule Take 20 mg by mouth as needed (acid reflux).    Yes Historical Provider, MD  Polyethyl Glycol-Propyl Glycol (SYSTANE OP) Place 1 drop into the right eye daily as needed (irriation).   Yes Historical Provider, MD   BP 170/76  Pulse 90  Temp(Src) 97.9 F (36.6 C) (Oral)  Resp 14  Wt 180 lb (81.647 kg)  SpO2 97% Physical Exam CONSTITUTIONAL: Well developed/well nourished HEAD AND FACE: Normocephalic/atraumatic EYES: EOMI/PERRL. OD - Mild conjunctival erythema.  Mild swelling to right eyelid.  No abrasions.  No dendritic lesions.  No foreign bodies noted.  No herpetic rash to face Visual acuity is appropriate.  No consensual pain.   ENMT: Mucous membranes moist.   NECK: supple no meningeal signs CV: S1/S2 noted, no murmurs/rubs/gallops noted LUNGS: Lungs are clear to auscultation bilaterally, no apparent distress ABDOMEN: soft, nontender, no rebound or guarding NEURO: Pt is awake/alert, moves all extremitiesx4 EXTREMITIES:full ROM SKIN: warm, color normal  ED Course  Procedures Pt with mild blepharitis and had recent discharge from eye (none seen on eye exam) No convincing signs of HSV keratitis or zoster Advised to monitor symptoms.  Will start on topical erythromycin MDM   Final diagnoses:  Discharge of right eye  Blepharitis, right    Nursing notes including past medical history and social history reviewed and considered in documentation     Sharyon Cable, MD 10/01/13  1513

## 2013-10-01 NOTE — Discharge Instructions (Signed)
KEEP EYE CLEAN USE EYE OINTMENT AS INSTRUCTED RETURN FOR WORSENED PAIN, IF YOUR VISION CHANGES, ANY EYE SWELLING OR NEW RASH OVER THE WEEKEND

## 2013-10-01 NOTE — ED Notes (Addendum)
R eye began itching Wednesday, Thursday morning eye was crusted shut.  Swelling and pain has progressed, but itching has gotten better. Has residual pain that escalates to 10/10 intermittently.  Still feels as if he has something in eye. Hx of CLL and  has herpetic sore on corner of R mouth and has recently completed round of acyclovir. He is concerned he may have transfer to eye.  No drainage noted and mild scleral redness.

## 2013-10-18 DIAGNOSIS — IMO0002 Reserved for concepts with insufficient information to code with codable children: Secondary | ICD-10-CM | POA: Diagnosis not present

## 2013-10-18 DIAGNOSIS — H109 Unspecified conjunctivitis: Secondary | ICD-10-CM | POA: Diagnosis not present

## 2013-10-18 DIAGNOSIS — K219 Gastro-esophageal reflux disease without esophagitis: Secondary | ICD-10-CM | POA: Diagnosis not present

## 2013-10-18 DIAGNOSIS — J449 Chronic obstructive pulmonary disease, unspecified: Secondary | ICD-10-CM | POA: Diagnosis not present

## 2013-11-08 ENCOUNTER — Encounter (HOSPITAL_COMMUNITY): Payer: Medicare Other | Attending: Hematology and Oncology

## 2013-11-08 ENCOUNTER — Telehealth (HOSPITAL_COMMUNITY): Payer: Self-pay | Admitting: *Deleted

## 2013-11-08 DIAGNOSIS — B009 Herpesviral infection, unspecified: Secondary | ICD-10-CM | POA: Diagnosis not present

## 2013-11-08 DIAGNOSIS — I4949 Other premature depolarization: Secondary | ICD-10-CM | POA: Insufficient documentation

## 2013-11-08 DIAGNOSIS — R002 Palpitations: Secondary | ICD-10-CM | POA: Insufficient documentation

## 2013-11-08 DIAGNOSIS — B259 Cytomegaloviral disease, unspecified: Secondary | ICD-10-CM | POA: Diagnosis not present

## 2013-11-08 DIAGNOSIS — C9110 Chronic lymphocytic leukemia of B-cell type not having achieved remission: Secondary | ICD-10-CM | POA: Insufficient documentation

## 2013-11-08 DIAGNOSIS — M1990 Unspecified osteoarthritis, unspecified site: Secondary | ICD-10-CM | POA: Diagnosis not present

## 2013-11-08 DIAGNOSIS — F341 Dysthymic disorder: Secondary | ICD-10-CM | POA: Diagnosis not present

## 2013-11-08 DIAGNOSIS — F17200 Nicotine dependence, unspecified, uncomplicated: Secondary | ICD-10-CM | POA: Insufficient documentation

## 2013-11-08 DIAGNOSIS — J449 Chronic obstructive pulmonary disease, unspecified: Secondary | ICD-10-CM | POA: Diagnosis not present

## 2013-11-08 DIAGNOSIS — H01009 Unspecified blepharitis unspecified eye, unspecified eyelid: Secondary | ICD-10-CM | POA: Diagnosis not present

## 2013-11-08 DIAGNOSIS — F1011 Alcohol abuse, in remission: Secondary | ICD-10-CM | POA: Insufficient documentation

## 2013-11-08 DIAGNOSIS — I359 Nonrheumatic aortic valve disorder, unspecified: Secondary | ICD-10-CM | POA: Diagnosis not present

## 2013-11-08 DIAGNOSIS — K219 Gastro-esophageal reflux disease without esophagitis: Secondary | ICD-10-CM | POA: Insufficient documentation

## 2013-11-08 DIAGNOSIS — Z7982 Long term (current) use of aspirin: Secondary | ICD-10-CM | POA: Diagnosis not present

## 2013-11-08 DIAGNOSIS — E785 Hyperlipidemia, unspecified: Secondary | ICD-10-CM | POA: Diagnosis not present

## 2013-11-08 DIAGNOSIS — D1779 Benign lipomatous neoplasm of other sites: Secondary | ICD-10-CM | POA: Diagnosis not present

## 2013-11-08 DIAGNOSIS — Z79899 Other long term (current) drug therapy: Secondary | ICD-10-CM | POA: Diagnosis not present

## 2013-11-08 DIAGNOSIS — I4510 Unspecified right bundle-branch block: Secondary | ICD-10-CM | POA: Insufficient documentation

## 2013-11-08 DIAGNOSIS — J4489 Other specified chronic obstructive pulmonary disease: Secondary | ICD-10-CM | POA: Insufficient documentation

## 2013-11-08 DIAGNOSIS — D801 Nonfamilial hypogammaglobulinemia: Secondary | ICD-10-CM | POA: Insufficient documentation

## 2013-11-08 LAB — CBC WITH DIFFERENTIAL/PLATELET
BASOS ABS: 0.3 10*3/uL — AB (ref 0.0–0.1)
Basophils Relative: 0 % (ref 0–1)
EOS ABS: 0.2 10*3/uL (ref 0.0–0.7)
Eosinophils Relative: 0 % (ref 0–5)
HCT: 45 % (ref 39.0–52.0)
Hemoglobin: 14.8 g/dL (ref 13.0–17.0)
Lymphocytes Relative: 92 % — ABNORMAL HIGH (ref 12–46)
Lymphs Abs: 90.5 10*3/uL — ABNORMAL HIGH (ref 0.7–4.0)
MCH: 31 pg (ref 26.0–34.0)
MCHC: 32.9 g/dL (ref 30.0–36.0)
MCV: 94.3 fL (ref 78.0–100.0)
Monocytes Absolute: 2 10*3/uL — ABNORMAL HIGH (ref 0.1–1.0)
Monocytes Relative: 2 % — ABNORMAL LOW (ref 3–12)
NEUTROS ABS: 5 10*3/uL (ref 1.7–7.7)
NEUTROS PCT: 5 % — AB (ref 43–77)
PLATELETS: 161 10*3/uL (ref 150–400)
RBC: 4.77 MIL/uL (ref 4.22–5.81)
RDW: 13.7 % (ref 11.5–15.5)
WBC: 98 10*3/uL (ref 4.0–10.5)

## 2013-11-08 LAB — COMPREHENSIVE METABOLIC PANEL
ALBUMIN: 4.1 g/dL (ref 3.5–5.2)
ALK PHOS: 129 U/L — AB (ref 39–117)
ALT: 13 U/L (ref 0–53)
ANION GAP: 9 (ref 5–15)
AST: 20 U/L (ref 0–37)
BUN: 16 mg/dL (ref 6–23)
CALCIUM: 9.3 mg/dL (ref 8.4–10.5)
CO2: 28 mEq/L (ref 19–32)
Chloride: 106 mEq/L (ref 96–112)
Creatinine, Ser: 0.92 mg/dL (ref 0.50–1.35)
GFR calc non Af Amer: 89 mL/min — ABNORMAL LOW (ref >90)
GLUCOSE: 106 mg/dL — AB (ref 70–99)
POTASSIUM: 4.2 meq/L (ref 3.7–5.3)
SODIUM: 143 meq/L (ref 137–147)
Total Bilirubin: 0.5 mg/dL (ref 0.3–1.2)
Total Protein: 6.6 g/dL (ref 6.0–8.3)

## 2013-11-08 LAB — LACTATE DEHYDROGENASE: LDH: 185 U/L (ref 94–250)

## 2013-11-08 LAB — SEDIMENTATION RATE: SED RATE: 1 mm/h (ref 0–16)

## 2013-11-08 NOTE — Progress Notes (Signed)
Alfred Brown's reason for visit today are for labs as scheduled per MD orders.  Venipuncture performed with a 23 gauge butterfly needle to Power tolerated venipuncture well and without incident; questions were answered and patient was discharged.

## 2013-11-08 NOTE — Telephone Encounter (Signed)
..  CRITICAL VALUE ALERT Critical value received:  WBC 98,000 Date of notification:  11/08/2013 Time of notification: 0370 Critical value read back:  Yes.   Nurse who received alert:  TAR MD notified (1st page):  formanek

## 2013-11-11 ENCOUNTER — Other Ambulatory Visit (HOSPITAL_COMMUNITY): Payer: Medicare Other

## 2013-11-11 ENCOUNTER — Ambulatory Visit (HOSPITAL_COMMUNITY): Payer: Medicare Other

## 2013-11-15 ENCOUNTER — Encounter (HOSPITAL_BASED_OUTPATIENT_CLINIC_OR_DEPARTMENT_OTHER): Payer: Medicare Other

## 2013-11-15 ENCOUNTER — Encounter (HOSPITAL_COMMUNITY): Payer: Self-pay

## 2013-11-15 VITALS — BP 126/55 | HR 75 | Temp 97.9°F | Resp 18 | Wt 184.0 lb

## 2013-11-15 DIAGNOSIS — D801 Nonfamilial hypogammaglobulinemia: Secondary | ICD-10-CM

## 2013-11-15 DIAGNOSIS — C9110 Chronic lymphocytic leukemia of B-cell type not having achieved remission: Secondary | ICD-10-CM

## 2013-11-15 NOTE — Progress Notes (Signed)
LABS FOR SIFX, B2M

## 2013-11-15 NOTE — Progress Notes (Signed)
[facility]  OFFICE PROGRESS NOTE  [name]., MD [place] [address]  DIAGNOSIS: Chronic lymphocytic leukemia - Plan: Beta 2 microglobulin, serum, Beta 2 microglobuline, serum, Beta 2 microglobulin, serum, CBC with Differential, Reticulocytes, Comprehensive metabolic panel, Lactate dehydrogenase, Beta 2 microglobuline, serum, Immunofixation electrophoresis  Hypogammaglobulinemia - Plan: Immunofixation electrophoresis, Immunofixation electrophoresis, CBC with Differential, Reticulocytes, Comprehensive metabolic panel, Lactate dehydrogenase, Beta 2 microglobuline, serum, Immunofixation electrophoresis  Chief Complaint  Patient presents with  . CLL  . Hypogammaglobulinemia  . Follow-up    CURRENT THERAPY: Watchful expectation and surveillance  INTERVAL HISTORY: Alfred Brown 64 y.o. male returns for followup of stage I chronic lymphocytic leukemia with enlarged lymph nodes and elevated white blood cell count known since November of 2004, never having received treatment. He also manifests hypogammaglobulinemia. Since his last visit he had a three-week bout of cheliosis along with facial vesicles that later scabbed. Treatment with acyclovir was not successful. He also had an episode on 10/01/2013 of blepharitis seen in the emergency room. He denies any sinus infections, bronchitis, pneumonia, urinary tract infections, or gastroenteritis. He does note easy satiety without significant lymphadenopathy, dark urine, headache, or seizures.     MEDICAL HISTORY: Past Medical History  Diagnosis Date  . CLL (chronic lymphocytic leukemia)   . COPD (chronic obstructive pulmonary disease)   . CMV (cytomegalovirus)   . Depression with anxiety   . DJD (degenerative joint disease)   . Lipoma     left chest wall  . Right bundle branch block     + left posterior fascicular block  . GERD (gastroesophageal reflux disease)   .  Hyperlipidemia   . Palpitations 2004    PVCs; borderline stress nuclear in 2004, negative in 2007; Echo 2007; AV-sclerotic, very mild AI  . Aortic valve disorder     Mild insufficiency  . Tobacco abuse     80 pack years    INTERIM HISTORY: has GERD (gastroesophageal reflux disease); Hyperlipidemia; Palpitations; Chronic lymphocytic leukemia; Aortic valve disorder; Right bundle branch block; and Tobacco abuse on his problem list.    ALLERGIES:  has No Known Allergies.  MEDICATIONS: has a current medication list which includes the following prescription(s): albuterol, alprazolam, aspirin, bupropion, calcium carbonate, fish oil-omega-3 fatty acids, fluticasone, loratadine, multivitamin, naproxen, niacin-lovastatin, acyclovir, diphenhydramine-pe-apap, omeprazole, and polyethyl glycol-propyl glycol.  SURGICAL HISTORY:  Past Surgical History  Procedure Laterality Date  . Rotator cuff repair  1997    right  . Ganglion cyst excision  1997    left wrist  . Colonoscopy  01/2012    Negative screening study  . Shoulder arthroscopy with subacromial decompression  04/09/2012    Procedure: SHOULDER ARTHROSCOPY WITH SUBACROMIAL DECOMPRESSION;  Surgeon: Ninetta Lights, MD;  Location: [facility];  Service: Orthopedics;  Laterality: Left;  LEFT SHOULDER ARTHROSCOPY, SUBACROMIAL DECOMPRESSION, PARTIAL ACROMIOPLASTY WITH CORACOACROMIAL RELEASE, DISTAL CLAVICULECTOMY WITH ROTATOR CUFF REPAIR, DEBRIDEMENT OF LABRUM    FAMILY HISTORY: family history includes Cancer in his brother and sister; Colon cancer in his sister; Heart attack in his father; Stroke in his mother.  SOCIAL HISTORY:  reports that he has been smoking Cigarettes.  He has a 45 pack-year smoking history. He has never used smokeless tobacco. He reports that he does not drink alcohol or use illicit drugs.  REVIEW OF SYSTEMS:  Other than that discussed above is noncontributory.  PHYSICAL EXAMINATION: ECOG PERFORMANCE STATUS: 1 -  Symptomatic but completely  ambulatory  Blood pressure 126/55, pulse 75, temperature 97.9 F (36.6 C), resp. rate 18, weight 184 lb (83.462 kg).  GENERAL:alert, no distress and comfortable SKIN: skin color, texture, turgor are normal, no rashes or significant lesions EYES: PERLA; Conjunctiva are pink and non-injected, sclera clear SINUSES: No redness or tenderness over maxillary or ethmoid sinuses OROPHARYNX:no exudate, no erythema on lips, buccal mucosa, or tongue. NECK: supple, thyroid normal size, non-tender, without nodularity. No masses CHEST: Normal AP diameter with no breast masses. LYMPH:  no palpable lymphadenopathy in the cervical, axillary or inguinal LUNGS: clear to auscultation and percussion with normal breathing effort HEART: regular rate & rhythm and no murmurs. ABDOMEN:abdomen soft, non-tender and normal bowel sounds. Spleen tip not palpable. MUSCULOSKELETAL:no cyanosis of digits and no clubbing. Range of motion normal.  NEURO: alert & oriented x 3 with fluent speech, no focal motor/sensory deficits   LABORATORY DATA: Lab on 11/08/2013  Component Date Value Ref Range Status  . WBC 11/08/2013 98.0* 4.0 - 10.5 K/uL Final   Comment: RESULT REPEATED AND VERIFIED                          WHITE COUNT CONFIRMED ON SMEAR                          CRITICAL RESULT CALLED TO, READ BACK BY AND VERIFIED WITH:                          TAMMY ROBERTSON ON [id number] AT 1130 BY RESSEGGER R  . RBC 11/08/2013 4.77  4.22 - 5.81 MIL/uL Final  . Hemoglobin 11/08/2013 14.8  13.0 - 17.0 g/dL Final  . HCT 11/08/2013 45.0  39.0 - 52.0 % Final  . MCV 11/08/2013 94.3  78.0 - 100.0 fL Final  . MCH 11/08/2013 31.0  26.0 - 34.0 pg Final  . MCHC 11/08/2013 32.9  30.0 - 36.0 g/dL Final  . RDW 11/08/2013 13.7  11.5 - 15.5 % Final  . Platelets 11/08/2013 161  150 - 400 K/uL Final  . Neutrophils Relative % 11/08/2013 5* 43 - 77 % Final  . Neutro Abs 11/08/2013 5.0  1.7 - 7.7 K/uL Final  . Lymphocytes  Relative 11/08/2013 92* 12 - 46 % Final  . Lymphs Abs 11/08/2013 90.5* 0.7 - 4.0 K/uL Final  . Monocytes Relative 11/08/2013 2* 3 - 12 % Final  . Monocytes Absolute 11/08/2013 2.0* 0.1 - 1.0 K/uL Final  . Eosinophils Relative 11/08/2013 0  0 - 5 % Final  . Eosinophils Absolute 11/08/2013 0.2  0.0 - 0.7 K/uL Final  . Basophils Relative 11/08/2013 0  0 - 1 % Final  . Basophils Absolute 11/08/2013 0.3* 0.0 - 0.1 K/uL Final  . WBC Morphology 11/08/2013 ABSOLUTE LYMPHOCYTOSIS   Final   Comment: ATYPICAL LYMPHOCYTES                          SMUDGE CELLS  . Sodium 11/08/2013 143  137 - 147 mEq/L Final  . Potassium 11/08/2013 4.2  3.7 - 5.3 mEq/L Final  . Chloride 11/08/2013 106  96 - 112 mEq/L Final  . CO2 11/08/2013 28  19 - 32 mEq/L Final  . Glucose, Bld 11/08/2013 106* 70 - 99 mg/dL Final  . BUN 11/08/2013 16  6 - 23 mg/dL Final  . Creatinine, Ser 11/08/2013 0.92  0.50 -  1.35 mg/dL Final  . Calcium 11/08/2013 9.3  8.4 - 10.5 mg/dL Final  . Total Protein 11/08/2013 6.6  6.0 - 8.3 g/dL Final  . Albumin 11/08/2013 4.1  3.5 - 5.2 g/dL Final  . AST 11/08/2013 20  0 - 37 U/L Final  . ALT 11/08/2013 13  0 - 53 U/L Final  . Alkaline Phosphatase 11/08/2013 129* 39 - 117 U/L Final  . Total Bilirubin 11/08/2013 0.5  0.3 - 1.2 mg/dL Final  . GFR calc non Af Amer 11/08/2013 89* >90 mL/min Final  . GFR calc Af Amer 11/08/2013 >90  >90 mL/min Final   Comment: (NOTE)                          The eGFR has been calculated using the CKD EPI equation.                          This calculation has not been validated in all clinical situations.                          eGFR's persistently <90 mL/min signify possible Chronic Kidney                          Disease.  . Anion gap 11/08/2013 9  5 - 15 Final  . LDH 11/08/2013 185  94 - 250 U/L Final  . Sed Rate 11/08/2013 1  0 - 16 mm/hr Final    PATHOLOGY: No new pathology.  Urinalysis No results found for this basename: colorurine,  appearanceur,   labspec,  phurine,  glucoseu,  hgbur,  bilirubinur,  ketonesur,  proteinur,  urobilinogen,  nitrite,  leukocytesur    RADIOGRAPHIC STUDIES: CT Pelvis Wo Contrast Status: Final result         PACS Images    Show images for CT Pelvis Wo Contrast         Study Result    CLINICAL DATA: Intermittent coccyx pain for 1 year.  EXAM:  CT PELVIS WITHOUT CONTRAST  TECHNIQUE:  Multidetector CT imaging of the pelvis was performed following the  standard protocol without intravenous contrast.  COMPARISON: DG SACRUM/COCCYX dated 02/01/2013  FINDINGS:  Slight cortical irregularity noted in the lower coccyx segment. This  could reflect old healed injury. This was not visible on prior plain  films. No evidence of acute fracture. No focal bone lesion.  Degenerative disc disease at L5-S1 with disc space narrowing, vacuum  disc and spurring.  Fusion across the right SI joint. Left SI joint this unremarkable.  Bilateral hip joints symmetric and unremarkable.  Scattered descending colonic and sigmoid diverticula. No active  diverticulitis. Small bowel is decompressed. No free fluid, free air  or adenopathy. Urinary bladder and prostate grossly unremarkable.  Aortic and iliac calcifications without aneurysm.  IMPRESSION:  Slight irregularity of the lower coccygeal segment which may reflect  old healed injury. No acute fracture or focal bone lesion.  Degenerative disc disease at L5-S1.  Left colonic diverticulosis.  Electronically Signed  By: Rolm Baptise M.D.  On: 07/05/2013      ASSESSMENT:  #1. Stage I chronic lymphocytic leukemia, known since November 2004, no indication for treatment at this time, actually with improvement in WBC count. #2. Hypogammaglobulinemia with recent outbreak of herpes plus blepharitis. #3. Chronic obstructive pulmonary disease.  #4. Old fracture of the coccyx,  currently asymptomatic.  #5. History of alcohol abuse, quitting in 1994    PLAN:  #1. Check  immunofixation and if IgG is low, give IV  IgG next week. #2. Followup in 3 months with CBC, chem profile, reticulocyte count, beta 2 microglobulin,, and immunofixation.   All questions were answered. The patient knows to call the clinic with any problems, questions or concerns. We can certainly see the patient much sooner if necessary.   I spent 30 minutes counseling the patient face to face. The total time spent in the appointment was 40 minutes.    Doroteo Bradford, MD 11/15/2013 11:36 AM  DISCLAIMER:  This note was dictated with voice recognition software.  Similar sounding words can inadvertently be transcribed inaccurately and may not be corrected upon review.

## 2013-11-15 NOTE — Patient Instructions (Signed)
..  [facility] Discharge Instructions  RECOMMENDATIONS MADE BY THE CONSULTANT AND ANY TEST RESULTS WILL BE SENT TO YOUR REFERRING PHYSICIAN.  EXAM FINDINGS BY THE PHYSICIAN TODAY AND SIGNS OR SYMPTOMS TO REPORT TO CLINIC OR PRIMARY PHYSICIAN: Exam and findings as discussed by Dr. Barnet Glasgow.  INSTRUCTIONS/FOLLOW-UP: Labs today and if your IGG levels are decreased we will set you up for IVIG Return in 3 months for labs and office visit  Thank you for choosing [facility] to provide your oncology and hematology care.  To afford each patient quality time with our providers, please arrive at least 15 minutes before your scheduled appointment time.  With your help, our goal is to use those 15 minutes to complete the necessary work-up to ensure our physicians have the information they need to help with your evaluation and healthcare recommendations.    Effective January 1st, 2014, we ask that you re-schedule your appointment with our physicians should you arrive 10 or more minutes late for your appointment.  We strive to give you quality time with our providers, and arriving late affects you and other patients whose appointments are after yours.    Again, thank you for choosing [facility].  Our hope is that these requests will decrease the amount of time that you wait before being seen by our physicians.       _____________________________________________________________  Should you have questions after your visit to [facility], please contact our office at [phone] between the hours of 8:30 a.m. and 4:30 p.m.  Voicemails left after 4:30 p.m. will not be returned until the following business day.  For prescription refill requests, have your pharmacy contact our office with your prescription refill request.    _______________________________________________________________  We hope that we have given you very good care.  You may receive  a patient satisfaction survey in the mail, please complete it and return it as soon as possible.  We value your feedback!  _______________________________________________________________  Have you asked about our STAR program?  STAR stands for Survivorship Training and Rehabilitation, and this is a nationally recognized cancer care program that focuses on survivorship and rehabilitation.  Cancer and cancer treatments may cause problems, such as, pain, making you feel tired and keeping you from doing the things that you need or want to do. Cancer rehabilitation can help. Our goal is to reduce these troubling effects and help you have the best quality of life possible.  You may receive a survey from a nurse that asks questions about your current state of health.  Based on the survey results, all eligible patients will be referred to the [facility] program for an evaluation so we can better serve you!  A frequently asked questions sheet is available upon request.

## 2013-11-17 LAB — IMMUNOFIXATION ELECTROPHORESIS
IgA: 60 mg/dL — ABNORMAL LOW (ref 68–379)
IgG (Immunoglobin G), Serum: 437 mg/dL — ABNORMAL LOW (ref 650–1600)
IgM, Serum: 23 mg/dL — ABNORMAL LOW (ref 41–251)
TOTAL PROTEIN ELP: 6 g/dL (ref 6.0–8.3)

## 2013-11-17 LAB — BETA 2 MICROGLOBULIN, SERUM: Beta-2 Microglobulin: 2.27 mg/L — ABNORMAL HIGH (ref <=2.51)

## 2014-01-03 DIAGNOSIS — D225 Melanocytic nevi of trunk: Secondary | ICD-10-CM | POA: Diagnosis not present

## 2014-01-03 DIAGNOSIS — D0461 Carcinoma in situ of skin of right upper limb, including shoulder: Secondary | ICD-10-CM | POA: Diagnosis not present

## 2014-01-24 DIAGNOSIS — Z23 Encounter for immunization: Secondary | ICD-10-CM | POA: Diagnosis not present

## 2014-02-07 ENCOUNTER — Encounter (HOSPITAL_COMMUNITY): Payer: Medicare Other | Attending: Hematology and Oncology

## 2014-02-07 DIAGNOSIS — C9110 Chronic lymphocytic leukemia of B-cell type not having achieved remission: Secondary | ICD-10-CM | POA: Insufficient documentation

## 2014-02-07 DIAGNOSIS — D045 Carcinoma in situ of skin of trunk: Secondary | ICD-10-CM | POA: Diagnosis not present

## 2014-02-07 DIAGNOSIS — D801 Nonfamilial hypogammaglobulinemia: Secondary | ICD-10-CM | POA: Diagnosis not present

## 2014-02-07 DIAGNOSIS — Z08 Encounter for follow-up examination after completed treatment for malignant neoplasm: Secondary | ICD-10-CM | POA: Diagnosis not present

## 2014-02-07 DIAGNOSIS — Z85828 Personal history of other malignant neoplasm of skin: Secondary | ICD-10-CM | POA: Diagnosis not present

## 2014-02-07 LAB — CBC WITH DIFFERENTIAL/PLATELET
Basophils Absolute: 0.2 10*3/uL — ABNORMAL HIGH (ref 0.0–0.1)
Basophils Relative: 0 % (ref 0–1)
Eosinophils Absolute: 0.3 10*3/uL (ref 0.0–0.7)
Eosinophils Relative: 0 % (ref 0–5)
HEMATOCRIT: 45.7 % (ref 39.0–52.0)
Hemoglobin: 15.2 g/dL (ref 13.0–17.0)
LYMPHS ABS: 94.4 10*3/uL — AB (ref 0.7–4.0)
LYMPHS PCT: 92 % — AB (ref 12–46)
MCH: 30.5 pg (ref 26.0–34.0)
MCHC: 33.3 g/dL (ref 30.0–36.0)
MCV: 91.8 fL (ref 78.0–100.0)
MONO ABS: 1.3 10*3/uL — AB (ref 0.1–1.0)
MONOS PCT: 1 % — AB (ref 3–12)
Neutro Abs: 6.4 10*3/uL (ref 1.7–7.7)
Neutrophils Relative %: 6 % — ABNORMAL LOW (ref 43–77)
Platelets: 164 10*3/uL (ref 150–400)
RBC: 4.98 MIL/uL (ref 4.22–5.81)
RDW: 13.4 % (ref 11.5–15.5)
WBC: 102.5 10*3/uL — AB (ref 4.0–10.5)

## 2014-02-07 LAB — COMPREHENSIVE METABOLIC PANEL
ALK PHOS: 140 U/L — AB (ref 39–117)
ALT: 13 U/L (ref 0–53)
AST: 20 U/L (ref 0–37)
Albumin: 4.2 g/dL (ref 3.5–5.2)
Anion gap: 8 (ref 5–15)
BUN: 17 mg/dL (ref 6–23)
CO2: 28 mEq/L (ref 19–32)
Calcium: 9.3 mg/dL (ref 8.4–10.5)
Chloride: 106 mEq/L (ref 96–112)
Creatinine, Ser: 0.95 mg/dL (ref 0.50–1.35)
GFR calc Af Amer: >90 mL/min (ref >90)
GFR calc non Af Amer: 87 mL/min — ABNORMAL LOW (ref >90)
Glucose, Bld: 119 mg/dL — ABNORMAL HIGH (ref 70–99)
POTASSIUM: 4.5 meq/L (ref 3.7–5.3)
Sodium: 142 mEq/L (ref 137–147)
Total Bilirubin: 0.4 mg/dL (ref 0.3–1.2)
Total Protein: 6.6 g/dL (ref 6.0–8.3)

## 2014-02-07 LAB — RETICULOCYTES
RBC.: 4.98 MIL/uL (ref 4.22–5.81)
Retic Count, Absolute: 49.8 10*3/uL (ref 19.0–186.0)
Retic Ct Pct: 1 % (ref 0.4–3.1)

## 2014-02-07 LAB — LACTATE DEHYDROGENASE: LDH: 191 U/L (ref 94–250)

## 2014-02-07 NOTE — Progress Notes (Signed)
Labs for cbcd,cmp,ldh,retic,sifx,b44m

## 2014-02-09 LAB — IMMUNOFIXATION ELECTROPHORESIS
IGA: 47 mg/dL — AB (ref 68–379)
IGG (IMMUNOGLOBIN G), SERUM: 449 mg/dL — AB (ref 650–1600)
IgM, Serum: 8 mg/dL — ABNORMAL LOW (ref 41–251)
Total Protein ELP: 6 g/dL (ref 6.0–8.3)

## 2014-02-09 LAB — BETA 2 MICROGLOBULIN, SERUM: Beta-2 Microglobulin: 2.26 mg/L — ABNORMAL HIGH (ref <=2.51)

## 2014-02-14 ENCOUNTER — Other Ambulatory Visit (HOSPITAL_COMMUNITY): Payer: Medicare Other

## 2014-02-14 ENCOUNTER — Encounter (HOSPITAL_BASED_OUTPATIENT_CLINIC_OR_DEPARTMENT_OTHER): Payer: Medicare Other

## 2014-02-14 ENCOUNTER — Encounter (HOSPITAL_COMMUNITY): Payer: Self-pay

## 2014-02-14 VITALS — BP 138/58 | HR 79 | Temp 98.0°F | Resp 16 | Wt 180.6 lb

## 2014-02-14 DIAGNOSIS — C9110 Chronic lymphocytic leukemia of B-cell type not having achieved remission: Secondary | ICD-10-CM

## 2014-02-14 DIAGNOSIS — D801 Nonfamilial hypogammaglobulinemia: Secondary | ICD-10-CM

## 2014-02-14 NOTE — Progress Notes (Signed)
[facility]  OFFICE PROGRESS NOTE  [name]., MD [place] [address]  DIAGNOSIS: Chronic lymphocytic leukemia - Plan: Beta 2 microglobulin, serum, CBC with Differential, Comprehensive metabolic panel, Reticulocytes, Lactate dehydrogenase, Multiple myeloma panel, serum  Hypogammaglobulinemia  Chief Complaint  Patient presents with  . chronic lymphocytic leukemia  . hypogammaglobulinemia    CURRENT THERAPY: Watchful expectation and surveillance.  INTERVAL HISTORY: Alfred Brown 63 y.o. male returns for followup of stage I chronic lymphocytic leukemia with enlarged lymph nodes and elevated white blood cell count known since November of 2004, never having received treatment. He also manifests hypogammaglobulinemia. He underwent resection of a basal cell carcinoma from the chest also a squamous cell carcinoma the right middle finger. He feels well except for easy satiety. He denies any nausea, vomiting, fever, night sweats, dark urine, increasing fatigue, bone pain, lower extremity swelling or redness, cough, wheezing, PND, orthopnea, palpitations, headache, or seizures.   MEDICAL HISTORY: Past Medical History  Diagnosis Date  . CLL (chronic lymphocytic leukemia)   . COPD (chronic obstructive pulmonary disease)   . CMV (cytomegalovirus)   . Depression with anxiety   . DJD (degenerative joint disease)   . Lipoma     left chest wall  . Right bundle branch block     + left posterior fascicular block  . GERD (gastroesophageal reflux disease)   . Hyperlipidemia   . Palpitations 2004    PVCs; borderline stress nuclear in 2004, negative in 2007; Echo 2007; AV-sclerotic, very mild AI  . Aortic valve disorder     Mild insufficiency  . Tobacco abuse     80 pack years    INTERIM HISTORY: has GERD (gastroesophageal reflux disease); Hyperlipidemia; Palpitations; Chronic lymphocytic leukemia; Aortic valve  disorder; Right bundle branch block; and Tobacco abuse on his problem list.    ALLERGIES:  has No Known Allergies.  MEDICATIONS: has a current medication list which includes the following prescription(s): acyclovir, albuterol, alprazolam, aspirin, bupropion, calcium carbonate, diphenhydramine-pe-apap, fish oil-omega-3 fatty acids, fluticasone, loratadine, multivitamin, naproxen, niacin-lovastatin, and omeprazole.  SURGICAL HISTORY:  Past Surgical History  Procedure Laterality Date  . Rotator cuff repair  1997    right  . Ganglion cyst excision  1997    left wrist  . Colonoscopy  01/2012    Negative screening study  . Shoulder arthroscopy with subacromial decompression  04/09/2012    Procedure: SHOULDER ARTHROSCOPY WITH SUBACROMIAL DECOMPRESSION;  Surgeon: Ninetta Lights, MD;  Location: [facility];  Service: Orthopedics;  Laterality: Left;  LEFT SHOULDER ARTHROSCOPY, SUBACROMIAL DECOMPRESSION, PARTIAL ACROMIOPLASTY WITH CORACOACROMIAL RELEASE, DISTAL CLAVICULECTOMY WITH ROTATOR CUFF REPAIR, DEBRIDEMENT OF LABRUM    FAMILY HISTORY: family history includes Cancer in his brother and sister; Colon cancer in his sister; Heart attack in his father; Stroke in his mother.  SOCIAL HISTORY:  reports that he has been smoking Cigarettes.  He has a 45 pack-year smoking history. He has never used smokeless tobacco. He reports that he does not drink alcohol or use illicit drugs.  REVIEW OF SYSTEMS:  Other than that discussed above is noncontributory.  PHYSICAL EXAMINATION: ECOG PERFORMANCE STATUS: 1 - Symptomatic but completely ambulatory  Blood pressure 138/58, pulse 79, temperature 98 F (36.7 C), temperature source Oral, resp. rate 16, weight 180 lb 9.6 oz (81.92 kg), SpO2 97 %.  GENERAL:alert, no distress and comfortable SKIN: skin color, texture, turgor are normal, no rashes or significant lesions EYES:  PERLA; Conjunctiva are pink and non-injected, sclera clear SINUSES: No  redness or tenderness over maxillary or ethmoid sinuses OROPHARYNX:no exudate, no erythema on lips, buccal mucosa, or tongue. NECK: supple, thyroid normal size, non-tender, without nodularity. No masses CHEST: increased AP diameter with no breast masses.anterior chest resection site well-healed. LYMPH:  no palpable lymphadenopathy in the cervical, axillary or inguinal LUNGS: clear to auscultation and percussion with normal breathing effort HEART: regular rate & rhythm and no murmurs. ABDOMEN:abdomen soft, non-tender and normal bowel sounds. Soft with no organomegaly, ascites, or CVA tenderness. MUSCULOSKELETAL:no cyanosis of digits and no clubbing. Range of motion normal. Right third finger resection site well-healed. NEURO: alert & oriented x 3 with fluent speech, no focal motor/sensory deficits   LABORATORY DATA:  Results for JUVENTINO, PAVONE (MRN [id number]) as of 02/14/2014 09:49  Ref. Range 01/04/2013 09:47 04/05/2013 10:58 07/05/2013 13:58 11/08/2013 10:08 02/07/2014 09:51  WBC Latest Range: 4.0-10.5 K/uL 134.7 (HH) 111.0 (HH) 129.0 (HH) 98.0 (HH) 102.5 (HH)     Lab on 02/07/2014  Component Date Value Ref Range Status  . Beta-2 Microglobulin 02/07/2014 2.26* <=2.51 mg/L Final   Performed at [facility]  . WBC 02/07/2014 102.5* 4.0 - 10.5 K/uL Final   Comment: RESULT REPEATED AND VERIFIED WHITE COUNT CONFIRMED ON SMEAR CRITICAL RESULT CALLED TO, READ BACK BY AND VERIFIED WITH: BURGESS RN ,[place] ON [id number] BY RESSEGGER R AT 1020   . RBC 02/07/2014 4.98  4.22 - 5.81 MIL/uL Final  . Hemoglobin 02/07/2014 15.2  13.0 - 17.0 g/dL Final  . HCT 02/07/2014 45.7  39.0 - 52.0 % Final  . MCV 02/07/2014 91.8  78.0 - 100.0 fL Final  . MCH 02/07/2014 30.5  26.0 - 34.0 pg Final  . MCHC 02/07/2014 33.3  30.0 - 36.0 g/dL Final  . RDW 02/07/2014 13.4  11.5 - 15.5 % Final  . Platelets 02/07/2014 164  150 - 400 K/uL Final  . Neutrophils Relative % 02/07/2014 6* 43 - 77 % Final  . Neutro Abs  02/07/2014 6.4  1.7 - 7.7 K/uL Final  . Lymphocytes Relative 02/07/2014 92* 12 - 46 % Final  . Lymphs Abs 02/07/2014 94.4* 0.7 - 4.0 K/uL Final  . Monocytes Relative 02/07/2014 1* 3 - 12 % Final  . Monocytes Absolute 02/07/2014 1.3* 0.1 - 1.0 K/uL Final  . Eosinophils Relative 02/07/2014 0  0 - 5 % Final  . Eosinophils Absolute 02/07/2014 0.3  0.0 - 0.7 K/uL Final  . Basophils Relative 02/07/2014 0  0 - 1 % Final  . Basophils Absolute 02/07/2014 0.2* 0.0 - 0.1 K/uL Final  . WBC Morphology 02/07/2014 ABSOLUTE LYMPHOCYTOSIS   Final   Comment: ATYPICAL LYMPHOCYTES SMUDGE CELLS   . Retic Ct Pct 02/07/2014 1.0  0.4 - 3.1 % Final  . RBC. 02/07/2014 4.98  4.22 - 5.81 MIL/uL Final  . Retic Count, Manual 02/07/2014 49.8  19.0 - 186.0 K/uL Final  . Sodium 02/07/2014 142  137 - 147 mEq/L Final  . Potassium 02/07/2014 4.5  3.7 - 5.3 mEq/L Final  . Chloride 02/07/2014 106  96 - 112 mEq/L Final  . CO2 02/07/2014 28  19 - 32 mEq/L Final  . Glucose, Bld 02/07/2014 119* 70 - 99 mg/dL Final  . BUN 02/07/2014 17  6 - 23 mg/dL Final  . Creatinine, Ser 02/07/2014 0.95  0.50 - 1.35 mg/dL Final  . Calcium 02/07/2014 9.3  8.4 - 10.5 mg/dL Final  . Total Protein 02/07/2014 6.6  6.0 -  8.3 g/dL Final  . Albumin 02/07/2014 4.2  3.5 - 5.2 g/dL Final  . AST 02/07/2014 20  0 - 37 U/L Final  . ALT 02/07/2014 13  0 - 53 U/L Final  . Alkaline Phosphatase 02/07/2014 140* 39 - 117 U/L Final  . Total Bilirubin 02/07/2014 0.4  0.3 - 1.2 mg/dL Final  . GFR calc non Af Amer 02/07/2014 87* >90 mL/min Final  . GFR calc Af Amer 02/07/2014 >90  >90 mL/min Final   Comment: (NOTE) The eGFR has been calculated using the CKD EPI equation. This calculation has not been validated in all clinical situations. eGFR's persistently <90 mL/min signify possible Chronic Kidney Disease.   . Anion gap 02/07/2014 8  5 - 15 Final  . LDH 02/07/2014 191  94 - 250 U/L Final  . Total Protein ELP 02/07/2014 6.0  6.0 - 8.3 g/dL Final  . IgG  (Immunoglobin G), Serum 02/07/2014 449* 650 - 1600 mg/dL Final  . IgA 02/07/2014 47* 68 - 379 mg/dL Final  . IgM, Serum 02/07/2014 8* 41 - 251 mg/dL Final  . Immunofix Electr Int 02/07/2014 (NOTE)   Final   Comment: No monoclonal protein identified. Reviewed by Odis Hollingshead, MD, PhD, FCAP (Electronic Signature on File) Performed at [facility]     PATHOLOGY:no new pathology.  Urinalysis No results found for: COLORURINE, APPEARANCEUR, LABSPEC, PHURINE, GLUCOSEU, HGBUR, BILIRUBINUR, KETONESUR, PROTEINUR, UROBILINOGEN, NITRITE, LEUKOCYTESUR  RADIOGRAPHIC STUDIES: No results found.  ASSESSMENT:  ASSESSMENT:  #1. Stage I chronic lymphocytic leukemia, known since November 2004, no indication for treatment at this time. #2. Hypogammaglobulinemia with recent outbreak of herpes plus blepharitis. #3. Chronic obstructive pulmonary disease.  #4. Old fracture of the coccyx, currently asymptomatic.  #5. History of alcohol abuse, quitting in 1994    PLAN:  #1. Follow-up in 4 months with CBC, chem profile, reticulocyte count, beta 2 microglobulin, LDH. #2. Patient was told to call should he develop worsening easy satiety, severe fatigue, significantly worsening bruising, or dark urine.   All questions were answered. The patient knows to call the clinic with any problems, questions or concerns. We can certainly see the patient much sooner if necessary.   I spent 25 minutes counseling the patient face to face. The total time spent in the appointment was 30 minutes.    Doroteo Bradford, MD 02/14/2014 10:30 AM  DISCLAIMER:  This note was dictated with voice recognition software.  Similar sounding words can inadvertently be transcribed inaccurately and may not be corrected upon review.

## 2014-02-14 NOTE — Patient Instructions (Signed)
[facility] Discharge Instructions  RECOMMENDATIONS MADE BY THE CONSULTANT AND ANY TEST RESULTS WILL BE SENT TO YOUR REFERRING PHYSICIAN.  EXAM FINDINGS BY THE PHYSICIAN TODAY AND SIGNS OR SYMPTOMS TO REPORT TO CLINIC OR PRIMARY PHYSICIAN: Exam and findings as discussed by Dr.Formanek.  MEDICATIONS PRESCRIBED:  Continue as prescribed  INSTRUCTIONS/FOLLOW-UP: Return in 4 months for MD appointment. Appointment 1 week prior to MD appointment for lab work.Watch for any symptoms of increased fatigue, easy bruising or dark urine.  Thank you for choosing [facility] to provide your oncology and hematology care.  To afford each patient quality time with our providers, please arrive at least 15 minutes before your scheduled appointment time.  With your help, our goal is to use those 15 minutes to complete the necessary work-up to ensure our physicians have the information they need to help with your evaluation and healthcare recommendations.    Effective January 1st, 2014, we ask that you re-schedule your appointment with our physicians should you arrive 10 or more minutes late for your appointment.  We strive to give you quality time with our providers, and arriving late affects you and other patients whose appointments are after yours.    Again, thank you for choosing [facility].  Our hope is that these requests will decrease the amount of time that you wait before being seen by our physicians.       _____________________________________________________________  Should you have questions after your visit to [facility], please contact our office at [phone] between the hours of 8:30 a.m. and 4:30 p.m.  Voicemails left after 4:30 p.m. will not be returned until the following business day.  For prescription refill requests, have your pharmacy contact our office with your prescription refill request.     _______________________________________________________________  We hope that we have given you very good care.  You may receive a patient satisfaction survey in the mail, please complete it and return it as soon as possible.  We value your feedback!  _______________________________________________________________  Have you asked about our STAR program?  STAR stands for Survivorship Training and Rehabilitation, and this is a nationally recognized cancer care program that focuses on survivorship and rehabilitation.  Cancer and cancer treatments may cause problems, such as, pain, making you feel tired and keeping you from doing the things that you need or want to do. Cancer rehabilitation can help. Our goal is to reduce these troubling effects and help you have the best quality of life possible.  You may receive a survey from a nurse that asks questions about your current state of health.  Based on the survey results, all eligible patients will be referred to the [facility] program for an evaluation so we can better serve you!  A frequently asked questions sheet is available upon request.

## 2014-05-12 DIAGNOSIS — Z85828 Personal history of other malignant neoplasm of skin: Secondary | ICD-10-CM | POA: Diagnosis not present

## 2014-05-12 DIAGNOSIS — Z08 Encounter for follow-up examination after completed treatment for malignant neoplasm: Secondary | ICD-10-CM | POA: Diagnosis not present

## 2014-06-06 ENCOUNTER — Encounter (HOSPITAL_COMMUNITY): Payer: Self-pay | Admitting: *Deleted

## 2014-06-06 ENCOUNTER — Encounter (HOSPITAL_COMMUNITY): Payer: Medicare Other | Attending: Hematology & Oncology

## 2014-06-06 DIAGNOSIS — C9110 Chronic lymphocytic leukemia of B-cell type not having achieved remission: Secondary | ICD-10-CM | POA: Diagnosis not present

## 2014-06-06 DIAGNOSIS — C4491 Basal cell carcinoma of skin, unspecified: Secondary | ICD-10-CM | POA: Insufficient documentation

## 2014-06-06 DIAGNOSIS — D801 Nonfamilial hypogammaglobulinemia: Secondary | ICD-10-CM | POA: Diagnosis not present

## 2014-06-06 DIAGNOSIS — D72829 Elevated white blood cell count, unspecified: Secondary | ICD-10-CM | POA: Insufficient documentation

## 2014-06-06 LAB — COMPREHENSIVE METABOLIC PANEL
ALK PHOS: 121 U/L — AB (ref 39–117)
ALT: 17 U/L (ref 0–53)
AST: 23 U/L (ref 0–37)
Albumin: 4.5 g/dL (ref 3.5–5.2)
Anion gap: 6 (ref 5–15)
BUN: 19 mg/dL (ref 6–23)
CHLORIDE: 109 mmol/L (ref 96–112)
CO2: 27 mmol/L (ref 19–32)
Calcium: 9.1 mg/dL (ref 8.4–10.5)
Creatinine, Ser: 0.94 mg/dL (ref 0.50–1.35)
GFR, EST NON AFRICAN AMERICAN: 88 mL/min — AB (ref >90)
GLUCOSE: 110 mg/dL — AB (ref 70–99)
POTASSIUM: 4 mmol/L (ref 3.5–5.1)
SODIUM: 142 mmol/L (ref 135–145)
Total Bilirubin: 0.6 mg/dL (ref 0.3–1.2)
Total Protein: 6.6 g/dL (ref 6.0–8.3)

## 2014-06-06 LAB — LACTATE DEHYDROGENASE: LDH: 152 U/L (ref 94–250)

## 2014-06-06 LAB — CBC WITH DIFFERENTIAL/PLATELET
BASOS ABS: 0.4 10*3/uL — AB (ref 0.0–0.1)
BASOS PCT: 0 % (ref 0–1)
Eosinophils Absolute: 0.4 10*3/uL (ref 0.0–0.7)
Eosinophils Relative: 0 % (ref 0–5)
HCT: 45.2 % (ref 39.0–52.0)
Hemoglobin: 14.6 g/dL (ref 13.0–17.0)
LYMPHS PCT: 94 % — AB (ref 12–46)
Lymphs Abs: 115.2 10*3/uL — ABNORMAL HIGH (ref 0.7–4.0)
MCH: 30.3 pg (ref 26.0–34.0)
MCHC: 32.3 g/dL (ref 30.0–36.0)
MCV: 93.8 fL (ref 78.0–100.0)
Monocytes Absolute: 2 10*3/uL — ABNORMAL HIGH (ref 0.1–1.0)
Monocytes Relative: 2 % — ABNORMAL LOW (ref 3–12)
NEUTROS ABS: 4.8 10*3/uL (ref 1.7–7.7)
Neutrophils Relative %: 4 % — ABNORMAL LOW (ref 43–77)
Platelets: 161 10*3/uL (ref 150–400)
RBC: 4.82 MIL/uL (ref 4.22–5.81)
RDW: 13.9 % (ref 11.5–15.5)
WBC: 122.7 10*3/uL — AB (ref 4.0–10.5)

## 2014-06-06 LAB — C-REACTIVE PROTEIN

## 2014-06-06 LAB — RETICULOCYTES
RBC.: 4.82 MIL/uL (ref 4.22–5.81)
RETIC COUNT ABSOLUTE: 72.3 10*3/uL (ref 19.0–186.0)
Retic Ct Pct: 1.5 % (ref 0.4–3.1)

## 2014-06-06 NOTE — Progress Notes (Signed)
CRITICAL VALUE ALERT Critical value received:  WBC 122,700 Date of notification:  06/06/14 Time of notification: 1024 Critical value read back:  Yes.   Nurse who received alert:  Isidoro Donning RN Kirby Crigler PA notified @ [phone]

## 2014-06-06 NOTE — Progress Notes (Signed)
LABS FOR MM,KLLC,LDH,B2M,CRP,CBCD,CMP

## 2014-06-07 LAB — KAPPA/LAMBDA LIGHT CHAINS
KAPPA, LAMDA LIGHT CHAIN RATIO: 0.39 (ref 0.26–1.65)
Kappa free light chain: 9.37 mg/L (ref 3.30–19.40)
LAMDA FREE LIGHT CHAINS: 24.25 mg/L (ref 5.71–26.30)

## 2014-06-07 LAB — BETA 2 MICROGLOBULIN, SERUM: BETA 2 MICROGLOBULIN: 1.7 mg/L (ref 0.6–2.4)

## 2014-06-08 LAB — MULTIPLE MYELOMA PANEL, SERUM
ALPHA-1-GLOBULIN: 4.1 % (ref 2.9–4.9)
ALPHA-2-GLOBULIN: 9.8 % (ref 7.1–11.8)
Albumin ELP: 69.4 % — ABNORMAL HIGH (ref 55.8–66.1)
BETA GLOBULIN: 5.5 % (ref 4.7–7.2)
Beta 2: 3.8 % (ref 3.2–6.5)
GAMMA GLOBULIN: 7.4 % — AB (ref 11.1–18.8)
IGM, SERUM: 8 mg/dL — AB (ref 41–251)
IgA: 50 mg/dL — ABNORMAL LOW (ref 68–379)
IgG (Immunoglobin G), Serum: 512 mg/dL — ABNORMAL LOW (ref 650–1600)
M-Spike, %: NOT DETECTED g/dL
Total Protein: 6.3 g/dL (ref 6.0–8.3)

## 2014-06-13 ENCOUNTER — Encounter (HOSPITAL_COMMUNITY): Payer: Self-pay | Admitting: Hematology & Oncology

## 2014-06-13 ENCOUNTER — Encounter (HOSPITAL_BASED_OUTPATIENT_CLINIC_OR_DEPARTMENT_OTHER): Payer: Medicare Other | Admitting: Hematology & Oncology

## 2014-06-13 VITALS — BP 134/60 | HR 81 | Temp 98.0°F | Resp 16 | Wt 186.0 lb

## 2014-06-13 DIAGNOSIS — R5383 Other fatigue: Secondary | ICD-10-CM

## 2014-06-13 DIAGNOSIS — H6123 Impacted cerumen, bilateral: Secondary | ICD-10-CM | POA: Diagnosis not present

## 2014-06-13 DIAGNOSIS — C9110 Chronic lymphocytic leukemia of B-cell type not having achieved remission: Secondary | ICD-10-CM

## 2014-06-13 NOTE — Patient Instructions (Addendum)
..  [place] at [facility] Discharge Instructions  RECOMMENDATIONS MADE BY THE CONSULTANT AND ANY TEST RESULTS WILL BE SENT TO YOUR REFERRING PHYSICIAN.  Exam per Dr. Whitney Muse  Return in 4 months with labs  Thank you for choosing [place] at [facility] to provide your oncology and hematology care.  To afford each patient quality time with our provider, please arrive at least 15 minutes before your scheduled appointment time.    You need to re-schedule your appointment should you arrive 10 or more minutes late.  We strive to give you quality time with our providers, and arriving late affects you and other patients whose appointments are after yours.  Also, if you no show three or more times for appointments you may be dismissed from the clinic at the providers discretion.     Again, thank you for choosing [facility].  Our hope is that these requests will decrease the amount of time that you wait before being seen by our physicians.       _____________________________________________________________  Should you have questions after your visit to [facility], please contact our office at [phone] between the hours of 8:30 a.m. and 4:30 p.m.  Voicemails left after 4:30 p.m. will not be returned until the following business day.  For prescription refill requests, have your pharmacy contact our office.

## 2014-06-13 NOTE — Progress Notes (Signed)
Orders placed for labs to be done on 06/16/2014

## 2014-06-15 NOTE — Progress Notes (Signed)
[name]., MD [place] [address]    DIAGNOSIS:  CLL, Stage I with LAD, elevated WBC   Hypogammaglobulinemia    Basal cell and Squamous cell carcinoma of skin   CURRENT THERAPY: Observation  INTERVAL HISTORY: Alfred Brown 64 y.o. male returns for follow-up of CLL. His major complaint is fatigue. He notes that although he can do all of his required activities during the day he has times when he has to sit and take a short nap, then he can get up and go again. He denies any new pain, no night sweats, no significant change in his appetite. He states his mood is good.  MEDICAL HISTORY: Past Medical History  Diagnosis Date  . CLL (chronic lymphocytic leukemia)   . COPD (chronic obstructive pulmonary disease)   . CMV (cytomegalovirus)   . Depression with anxiety   . DJD (degenerative joint disease)   . Lipoma     left chest wall  . Right bundle branch block     + left posterior fascicular block  . GERD (gastroesophageal reflux disease)   . Hyperlipidemia   . Palpitations 2004    PVCs; borderline stress nuclear in 2004, negative in 2007; Echo 2007; AV-sclerotic, very mild AI  . Aortic valve disorder     Mild insufficiency  . Tobacco abuse     80 pack years    has GERD (gastroesophageal reflux disease); Hyperlipidemia; Palpitations; Chronic lymphocytic leukemia; Aortic valve disorder; Right bundle branch block; and Tobacco abuse on his problem list.     has No Known Allergies.  Alfred Brown does not currently have medications on file.  SURGICAL HISTORY: Past Surgical History  Procedure Laterality Date  . Rotator cuff repair  1997    right  . Ganglion cyst excision  1997    left wrist  . Colonoscopy  01/2012    Negative screening study  . Shoulder arthroscopy with subacromial decompression  04/09/2012    Procedure: SHOULDER ARTHROSCOPY WITH SUBACROMIAL DECOMPRESSION;  Surgeon: Ninetta Lights, MD;  Location: [facility];   Service: Orthopedics;  Laterality: Left;  LEFT SHOULDER ARTHROSCOPY, SUBACROMIAL DECOMPRESSION, PARTIAL ACROMIOPLASTY WITH CORACOACROMIAL RELEASE, DISTAL CLAVICULECTOMY WITH ROTATOR CUFF REPAIR, DEBRIDEMENT OF LABRUM    SOCIAL HISTORY: History   Social History  . Marital Status: Married    Spouse Name: N/A  . Number of Children: N/A  . Years of Education: N/A   Occupational History  . Not on file.   Social History Main Topics  . Smoking status: Current Every Day Smoker -- 1.00 packs/day for 45 years    Types: Cigarettes  . Smokeless tobacco: Never Used     Comment: Consumption decreased approximately 2010 to one pack per day  . Alcohol Use: No  . Drug Use: No  . Sexual Activity: Not on file   Other Topics Concern  . Not on file   Social History Narrative    FAMILY HISTORY: Family History  Problem Relation Age of Onset  . Stroke Mother   . Heart attack Father   . Cancer Sister     colon  . Colon cancer Sister   . Cancer Brother     2 brothers died with lung cancer    Review of Systems  Constitutional: Positive for malaise/fatigue. Negative for fever, chills and weight loss.  HENT: Negative for congestion, hearing loss, nosebleeds, sore throat and tinnitus.   Eyes: Negative for blurred vision, double vision, pain and discharge.  Respiratory: Negative for cough, hemoptysis, sputum production, shortness of breath and wheezing.   Cardiovascular: Negative for chest pain, palpitations, claudication, leg swelling and PND.  Gastrointestinal: Negative for heartburn, nausea, vomiting, abdominal pain, diarrhea, constipation, blood in stool and melena.  Genitourinary: Negative for dysuria, urgency, frequency and hematuria.  Musculoskeletal: Positive for joint pain. Negative for myalgias and falls.  Skin: Negative for itching and rash.  Neurological: Negative for dizziness, tingling, tremors, sensory change, speech change, focal weakness, seizures, loss of consciousness,  weakness and headaches.  Endo/Heme/Allergies: Does not bruise/bleed easily.  Psychiatric/Behavioral: Negative for depression, suicidal ideas, memory loss and substance abuse. The patient is not nervous/anxious and does not have insomnia.     PHYSICAL EXAMINATION  ECOG PERFORMANCE STATUS: 1 - Symptomatic but completely ambulatory  Filed Vitals:   06/13/14 0914  BP: 134/60  Pulse: 81  Temp: 98 F (36.7 C)  Resp: 16    Physical Exam  Constitutional: He is oriented to person, place, and time and well-developed, well-nourished, and in no distress.  HENT:  Head: Normocephalic and atraumatic.  Nose: Nose normal.  Mouth/Throat: Oropharynx is clear and moist. No oropharyngeal exudate.  Eyes: Conjunctivae and EOM are normal. Pupils are equal, round, and reactive to light. Right eye exhibits no discharge. Left eye exhibits no discharge. No scleral icterus.  Neck: Normal range of motion. Neck supple. No tracheal deviation present. No thyromegaly present.  Cardiovascular: Normal rate, regular rhythm and normal heart sounds.  Exam reveals no gallop and no friction rub.   No murmur heard. Pulmonary/Chest: Effort normal and breath sounds normal. He has no wheezes. He has no rales.  Abdominal: Soft. Bowel sounds are normal. He exhibits no distension and no mass. There is no tenderness. There is no rebound and no guarding.  Musculoskeletal: Normal range of motion. He exhibits no edema.  Lymphadenopathy:       Head (right side): Submandibular adenopathy present.       Head (left side): Submandibular adenopathy present.    He has cervical adenopathy.       Right cervical: Superficial cervical adenopathy present.       Left cervical: Superficial cervical adenopathy present.  Neurological: He is alert and oriented to person, place, and time. He has normal reflexes. No cranial nerve deficit. Gait normal. Coordination normal.  Skin: Skin is warm and dry. No rash noted.  Psychiatric: Mood, memory,  affect and judgment normal.  Nursing note and vitals reviewed.   LABORATORY DATA:  CBC    Component Value Date/Time   WBC 122.7* 06/06/2014 0930   RBC 4.82 06/06/2014 0930   RBC 4.82 06/06/2014 0930   HGB 14.6 06/06/2014 0930   HCT 45.2 06/06/2014 0930   PLT 161 06/06/2014 0930   MCV 93.8 06/06/2014 0930   MCH 30.3 06/06/2014 0930   MCHC 32.3 06/06/2014 0930   RDW 13.9 06/06/2014 0930   LYMPHSABS 115.2* 06/06/2014 0930   MONOABS 2.0* 06/06/2014 0930   EOSABS 0.4 06/06/2014 0930   BASOSABS 0.4* 06/06/2014 0930   CMP     Component Value Date/Time   NA 142 06/06/2014 0930   K 4.0 06/06/2014 0930   CL 109 06/06/2014 0930   CO2 27 06/06/2014 0930   GLUCOSE 110* 06/06/2014 0930   BUN 19 06/06/2014 0930   CREATININE 0.94 06/06/2014 0930   CALCIUM 9.1 06/06/2014 0930   PROT 6.3 06/06/2014 0955   ALBUMIN 4.5 06/06/2014 0930   AST 23 06/06/2014 0930   ALT 17 06/06/2014 0930  ALKPHOS 121* 06/06/2014 0930   BILITOT 0.6 06/06/2014 0930   GFRNONAA 88* 06/06/2014 0930   GFRAA >90 06/06/2014 0930       ASSESSMENT and THERAPY PLAN:   CLL  64 year old male with CLL. Blood counts remain stable. He does have increasing fatigue which may be disease related. We spent time today addressing indications to treat CLL. He and his wife asked for new reading information and were provided with a new patient information from the leukemia and lymphoma society. We will keep him on 4 month visits. He was advised if he has any additional problems or concerns prior to his next establish follow-up, to call and we would bring him back sooner.  All questions were answered. The patient knows to call the clinic with any problems, questions or concerns. We can certainly see the patient much sooner if necessary. This note was electronically signed. Molli Hazard 06/15/2014

## 2014-08-08 ENCOUNTER — Other Ambulatory Visit (HOSPITAL_COMMUNITY): Payer: Self-pay | Admitting: Internal Medicine

## 2014-08-08 ENCOUNTER — Ambulatory Visit (HOSPITAL_COMMUNITY)
Admission: RE | Admit: 2014-08-08 | Discharge: 2014-08-08 | Disposition: A | Discharge status: Home / Self Care | Payer: Medicare Other | Source: Ambulatory Visit | Attending: Internal Medicine | Admitting: Internal Medicine

## 2014-08-08 DIAGNOSIS — Y92010 Kitchen of single-family (private) house as the place of occurrence of the external cause: Secondary | ICD-10-CM | POA: Diagnosis not present

## 2014-08-08 DIAGNOSIS — X58XXXA Exposure to other specified factors, initial encounter: Secondary | ICD-10-CM | POA: Diagnosis not present

## 2014-08-08 DIAGNOSIS — S6991XA Unspecified injury of right wrist, hand and finger(s), initial encounter: Secondary | ICD-10-CM | POA: Insufficient documentation

## 2014-08-08 DIAGNOSIS — S6990XA Unspecified injury of unspecified wrist, hand and finger(s), initial encounter: Secondary | ICD-10-CM

## 2014-08-08 DIAGNOSIS — Y939 Activity, unspecified: Secondary | ICD-10-CM | POA: Insufficient documentation

## 2014-08-08 DIAGNOSIS — M7989 Other specified soft tissue disorders: Secondary | ICD-10-CM | POA: Diagnosis not present

## 2014-08-08 DIAGNOSIS — M79644 Pain in right finger(s): Secondary | ICD-10-CM | POA: Diagnosis not present

## 2014-08-19 DIAGNOSIS — M79644 Pain in right finger(s): Secondary | ICD-10-CM | POA: Diagnosis not present

## 2014-10-10 ENCOUNTER — Encounter (HOSPITAL_COMMUNITY): Payer: Medicare Other | Attending: Hematology & Oncology

## 2014-10-10 DIAGNOSIS — I358 Other nonrheumatic aortic valve disorders: Secondary | ICD-10-CM | POA: Insufficient documentation

## 2014-10-10 DIAGNOSIS — J449 Chronic obstructive pulmonary disease, unspecified: Secondary | ICD-10-CM | POA: Diagnosis not present

## 2014-10-10 DIAGNOSIS — M1990 Unspecified osteoarthritis, unspecified site: Secondary | ICD-10-CM | POA: Diagnosis not present

## 2014-10-10 DIAGNOSIS — I4510 Unspecified right bundle-branch block: Secondary | ICD-10-CM | POA: Diagnosis not present

## 2014-10-10 DIAGNOSIS — F418 Other specified anxiety disorders: Secondary | ICD-10-CM | POA: Insufficient documentation

## 2014-10-10 DIAGNOSIS — I445 Left posterior fascicular block: Secondary | ICD-10-CM | POA: Insufficient documentation

## 2014-10-10 DIAGNOSIS — F17210 Nicotine dependence, cigarettes, uncomplicated: Secondary | ICD-10-CM | POA: Insufficient documentation

## 2014-10-10 DIAGNOSIS — D801 Nonfamilial hypogammaglobulinemia: Secondary | ICD-10-CM | POA: Insufficient documentation

## 2014-10-10 DIAGNOSIS — C9110 Chronic lymphocytic leukemia of B-cell type not having achieved remission: Secondary | ICD-10-CM | POA: Insufficient documentation

## 2014-10-10 LAB — CBC WITH DIFFERENTIAL/PLATELET
Basophils Absolute: 0.4 10*3/uL — ABNORMAL HIGH (ref 0.0–0.1)
Basophils Relative: 0 % (ref 0–1)
EOS PCT: 0 % (ref 0–5)
Eosinophils Absolute: 0.3 10*3/uL (ref 0.0–0.7)
HCT: 46 % (ref 39.0–52.0)
Hemoglobin: 14.9 g/dL (ref 13.0–17.0)
Lymphocytes Relative: 94 % — ABNORMAL HIGH (ref 12–46)
Lymphs Abs: 114.4 10*3/uL — ABNORMAL HIGH (ref 0.7–4.0)
MCH: 30.1 pg (ref 26.0–34.0)
MCHC: 32.4 g/dL (ref 30.0–36.0)
MCV: 92.9 fL (ref 78.0–100.0)
Monocytes Absolute: 1.9 10*3/uL — ABNORMAL HIGH (ref 0.1–1.0)
Monocytes Relative: 2 % — ABNORMAL LOW (ref 3–12)
NEUTROS PCT: 4 % — AB (ref 43–77)
Neutro Abs: 5.5 10*3/uL (ref 1.7–7.7)
Platelets: 138 10*3/uL — ABNORMAL LOW (ref 150–400)
RBC: 4.95 MIL/uL (ref 4.22–5.81)
RDW: 13.8 % (ref 11.5–15.5)
WBC: 122.4 10*3/uL — AB (ref 4.0–10.5)

## 2014-10-10 LAB — COMPREHENSIVE METABOLIC PANEL
ALT: 16 U/L — AB (ref 17–63)
ANION GAP: 6 (ref 5–15)
AST: 23 U/L (ref 15–41)
Albumin: 4.5 g/dL (ref 3.5–5.0)
Alkaline Phosphatase: 126 U/L (ref 38–126)
BILIRUBIN TOTAL: 1 mg/dL (ref 0.3–1.2)
BUN: 19 mg/dL (ref 6–20)
CALCIUM: 8.9 mg/dL (ref 8.9–10.3)
CO2: 26 mmol/L (ref 22–32)
Chloride: 108 mmol/L (ref 101–111)
Creatinine, Ser: 0.94 mg/dL (ref 0.61–1.24)
GFR calc Af Amer: >60 mL/min (ref >60)
GFR calc non Af Amer: >60 mL/min (ref >60)
GLUCOSE: 124 mg/dL — AB (ref 65–99)
Potassium: 4 mmol/L (ref 3.5–5.1)
Sodium: 140 mmol/L (ref 135–145)
Total Protein: 6.5 g/dL (ref 6.5–8.1)

## 2014-10-10 LAB — LACTATE DEHYDROGENASE: LDH: 159 U/L (ref 98–192)

## 2014-10-10 NOTE — Progress Notes (Signed)
CRITICAL VALUE ALERT  Critical value received:  WBC 122,400  Date of notification:  10/10/14   Time of notification:  5750   Critical value read back:Yes.    Nurse who received alert:  A. Ouida Sills, RN  MD notified:  Caroleen Hamman, PA-C at [phone]

## 2014-10-10 NOTE — Progress Notes (Signed)
Labs drawn

## 2014-10-11 DIAGNOSIS — C9190 Lymphoid leukemia, unspecified not having achieved remission: Secondary | ICD-10-CM | POA: Diagnosis not present

## 2014-10-11 DIAGNOSIS — Z0000 Encounter for general adult medical examination without abnormal findings: Secondary | ICD-10-CM | POA: Diagnosis not present

## 2014-10-11 DIAGNOSIS — Z125 Encounter for screening for malignant neoplasm of prostate: Secondary | ICD-10-CM | POA: Diagnosis not present

## 2014-10-11 DIAGNOSIS — K219 Gastro-esophageal reflux disease without esophagitis: Secondary | ICD-10-CM | POA: Diagnosis not present

## 2014-10-11 DIAGNOSIS — Z6823 Body mass index (BMI) 23.0-23.9, adult: Secondary | ICD-10-CM | POA: Diagnosis not present

## 2014-10-11 DIAGNOSIS — G64 Other disorders of peripheral nervous system: Secondary | ICD-10-CM | POA: Diagnosis not present

## 2014-10-11 DIAGNOSIS — Z1389 Encounter for screening for other disorder: Secondary | ICD-10-CM | POA: Diagnosis not present

## 2014-10-11 DIAGNOSIS — N401 Enlarged prostate with lower urinary tract symptoms: Secondary | ICD-10-CM | POA: Diagnosis not present

## 2014-10-11 DIAGNOSIS — Z79899 Other long term (current) drug therapy: Secondary | ICD-10-CM | POA: Diagnosis not present

## 2014-10-11 DIAGNOSIS — J449 Chronic obstructive pulmonary disease, unspecified: Secondary | ICD-10-CM | POA: Diagnosis not present

## 2014-10-11 DIAGNOSIS — R5383 Other fatigue: Secondary | ICD-10-CM | POA: Diagnosis not present

## 2014-10-11 LAB — IMMUNOFIXATION ELECTROPHORESIS
IGG (IMMUNOGLOBIN G), SERUM: 469 mg/dL — AB (ref 700–1600)
IgA: 42 mg/dL — ABNORMAL LOW (ref 61–437)
Total Protein ELP: 5.8 g/dL — ABNORMAL LOW (ref 6.0–8.5)

## 2014-10-11 LAB — PROTEIN ELECTROPHORESIS, SERUM
A/G Ratio: 1.8 — ABNORMAL HIGH (ref 0.7–1.7)
ALPHA-1-GLOBULIN: 0.2 g/dL (ref 0.0–0.4)
ALPHA-2-GLOBULIN: 0.6 g/dL (ref 0.4–1.0)
Albumin ELP: 3.8 g/dL (ref 2.9–4.4)
Beta Globulin: 0.8 g/dL (ref 0.7–1.3)
GLOBULIN, TOTAL: 2.1 g/dL — AB (ref 2.2–3.9)
Gamma Globulin: 0.5 g/dL (ref 0.4–1.8)
Total Protein ELP: 5.9 g/dL — ABNORMAL LOW (ref 6.0–8.5)

## 2014-10-11 LAB — IGG, IGA, IGM
IgA: 41 mg/dL — ABNORMAL LOW (ref 61–437)
IgG (Immunoglobin G), Serum: 473 mg/dL — ABNORMAL LOW (ref 700–1600)

## 2014-10-13 ENCOUNTER — Ambulatory Visit (HOSPITAL_COMMUNITY): Payer: Medicare Other | Admitting: Hematology & Oncology

## 2014-10-17 ENCOUNTER — Encounter (HOSPITAL_COMMUNITY): Payer: Self-pay | Admitting: Hematology & Oncology

## 2014-10-17 ENCOUNTER — Encounter (HOSPITAL_BASED_OUTPATIENT_CLINIC_OR_DEPARTMENT_OTHER): Payer: Medicare Other | Admitting: Hematology & Oncology

## 2014-10-17 VITALS — BP 126/67 | HR 74 | Temp 98.1°F | Resp 18 | Ht 72.25 in | Wt 182.8 lb

## 2014-10-17 DIAGNOSIS — C9110 Chronic lymphocytic leukemia of B-cell type not having achieved remission: Secondary | ICD-10-CM | POA: Diagnosis not present

## 2014-10-17 DIAGNOSIS — D696 Thrombocytopenia, unspecified: Secondary | ICD-10-CM | POA: Diagnosis not present

## 2014-10-17 DIAGNOSIS — R5383 Other fatigue: Secondary | ICD-10-CM | POA: Diagnosis not present

## 2014-10-17 NOTE — Progress Notes (Signed)
[name]., MD [place] [address]    DIAGNOSIS:  CLL, Stage I with LAD, elevated WBC   Hypogammaglobulinemia    Basal cell and Squamous cell carcinoma of skin   CURRENT THERAPY: Observation  INTERVAL HISTORY: Celine Mans 64 y.o. male returns for follow-up of CLL. His major complaint is fatigue. He notes that although he can do all of his required activities during the day he has times when he has to sit and take a short nap, then he can get up and go again. He denies any new pain, no night sweats, no significant change in his appetite. He states his mood is good.   The patient noted itching that started approximately one week ago. He describes it as feeling like it was on the inside. He was given a prescription for Atarax. He notes however it has resolved and he never had to use the prescription.  Other than that he reports no change since his last visit.  MEDICAL HISTORY: Past Medical History  Diagnosis Date  . CLL (chronic lymphocytic leukemia)   . COPD (chronic obstructive pulmonary disease)   . CMV (cytomegalovirus)   . Depression with anxiety   . DJD (degenerative joint disease)   . Lipoma     left chest wall  . Right bundle branch block     + left posterior fascicular block  . GERD (gastroesophageal reflux disease)   . Hyperlipidemia   . Palpitations 2004    PVCs; borderline stress nuclear in 2004, negative in 2007; Echo 2007; AV-sclerotic, very mild AI  . Aortic valve disorder     Mild insufficiency  . Tobacco abuse     80 pack years    has GERD (gastroesophageal reflux disease); Hyperlipidemia; Palpitations; Chronic lymphocytic leukemia; Aortic valve disorder; Right bundle branch block; and Tobacco abuse on his problem list.     has No Known Allergies.  Mr. Montour had no medications administered during this visit.  SURGICAL HISTORY: Past Surgical History  Procedure Laterality Date  . Rotator cuff repair  1997   right  . Ganglion cyst excision  1997    left wrist  . Colonoscopy  01/2012    Negative screening study  . Shoulder arthroscopy with subacromial decompression  04/09/2012    Procedure: SHOULDER ARTHROSCOPY WITH SUBACROMIAL DECOMPRESSION;  Surgeon: Ninetta Lights, MD;  Location: [facility];  Service: Orthopedics;  Laterality: Left;  LEFT SHOULDER ARTHROSCOPY, SUBACROMIAL DECOMPRESSION, PARTIAL ACROMIOPLASTY WITH CORACOACROMIAL RELEASE, DISTAL CLAVICULECTOMY WITH ROTATOR CUFF REPAIR, DEBRIDEMENT OF LABRUM    SOCIAL HISTORY: History   Social History  . Marital Status: Married    Spouse Name: N/A  . Number of Children: N/A  . Years of Education: N/A   Occupational History  . Not on file.   Social History Main Topics  . Smoking status: Current Every Day Smoker -- 1.00 packs/day for 45 years    Types: Cigarettes  . Smokeless tobacco: Never Used     Comment: Consumption decreased approximately 2010 to one pack per day  . Alcohol Use: No  . Drug Use: No  . Sexual Activity: Not on file   Other Topics Concern  . Not on file   Social History Narrative    FAMILY HISTORY: Family History  Problem Relation Age of Onset  . Stroke Mother   . Heart attack Father   . Cancer Sister     colon  . Colon cancer Sister   .  Cancer Brother     2 brothers died with lung cancer    Review of Systems  Constitutional: Positive for malaise/fatigue. Negative for fever, chills and weight loss.  HENT: Negative for congestion, hearing loss, nosebleeds, sore throat and tinnitus.   Eyes: Negative for blurred vision, double vision, pain and discharge.  Respiratory: Negative for cough, hemoptysis, sputum production, shortness of breath and wheezing.   Cardiovascular: Negative for chest pain, palpitations, claudication, leg swelling and PND.  Gastrointestinal: Negative for heartburn, nausea, vomiting, abdominal pain, diarrhea, constipation, blood in stool and melena.  Genitourinary:  Negative for dysuria, urgency, frequency and hematuria.  Musculoskeletal: Positive for joint pain. Negative for myalgias and falls.  Skin: Negative for itching and rash.  Neurological: Negative for dizziness, tingling, tremors, sensory change, speech change, focal weakness, seizures, loss of consciousness, weakness and headaches.  Endo/Heme/Allergies: Does not bruise/bleed easily.  Psychiatric/Behavioral: Negative for depression, suicidal ideas, memory loss and substance abuse. The patient is not nervous/anxious and does not have insomnia.   14 point review of systems was performed and is negative except as detailed under history of present illness and above   PHYSICAL EXAMINATION  ECOG PERFORMANCE STATUS: 1 - Symptomatic but completely ambulatory  Filed Vitals:   10/17/14 1127  BP: 126/67  Pulse: 74  Temp: 98.1 F (36.7 C)  Resp: 18    Physical Exam  Constitutional: He is oriented to person, place, and time and well-developed, well-nourished, and in no distress.  HENT:  Head: Normocephalic and atraumatic.  Nose: Nose normal.  Mouth/Throat: Oropharynx is clear and moist. No oropharyngeal exudate.  Eyes: Conjunctivae and EOM are normal. Pupils are equal, round, and reactive to light. Right eye exhibits no discharge. Left eye exhibits no discharge. No scleral icterus.  Neck: Normal range of motion. Neck supple. No tracheal deviation present. No thyromegaly present.  Cardiovascular: Normal rate, regular rhythm and normal heart sounds.  Exam reveals no gallop and no friction rub.   No murmur heard. Pulmonary/Chest: Effort normal and breath sounds normal. He has no wheezes. He has no rales.  Abdominal: Soft. Bowel sounds are normal. He exhibits no distension and no mass. There is no tenderness. There is no rebound and no guarding.  Musculoskeletal: Normal range of motion. He exhibits no edema.  Lymphadenopathy:       Head (right side): Submandibular adenopathy present.       Head  (left side): Submandibular adenopathy present.    He has cervical adenopathy.       Right cervical: Superficial cervical adenopathy present.       Left cervical: Superficial cervical adenopathy present.  Largest right inguinal node, 3 cm  Neurological: He is alert and oriented to person, place, and time. He has normal reflexes. No cranial nerve deficit. Gait normal. Coordination normal.  Skin: Skin is warm and dry. No rash noted.  Psychiatric: Mood, memory, affect and judgment normal.  Nursing note and vitals reviewed.   LABORATORY DATA:  CBC    Component Value Date/Time   WBC 122.4* 10/10/2014 0947   RBC 4.95 10/10/2014 0947   RBC 4.82 06/06/2014 0930   HGB 14.9 10/10/2014 0947   HCT 46.0 10/10/2014 0947   PLT 138* 10/10/2014 0947   MCV 92.9 10/10/2014 0947   MCH 30.1 10/10/2014 0947   MCHC 32.4 10/10/2014 0947   RDW 13.8 10/10/2014 0947   LYMPHSABS 114.4* 10/10/2014 0947   MONOABS 1.9* 10/10/2014 0947   EOSABS 0.3 10/10/2014 0947   BASOSABS 0.4* 10/10/2014 6203  CMP     Component Value Date/Time   NA 140 10/10/2014 0947   K 4.0 10/10/2014 0947   CL 108 10/10/2014 0947   CO2 26 10/10/2014 0947   GLUCOSE 124* 10/10/2014 0947   BUN 19 10/10/2014 0947   CREATININE 0.94 10/10/2014 0947   CALCIUM 8.9 10/10/2014 0947   PROT 6.5 10/10/2014 0947   ALBUMIN 4.5 10/10/2014 0947   AST 23 10/10/2014 0947   ALT 16* 10/10/2014 0947   ALKPHOS 126 10/10/2014 0947   BILITOT 1.0 10/10/2014 0947   GFRNONAA >60 10/10/2014 0947   GFRAA >60 10/10/2014 0947       ASSESSMENT and THERAPY PLAN:   CLL Thrombocytopenia  64 year old male with CLL. Blood counts remain fairly stable. Platelet count is somewhat lower. I have recommended repeating a CBC only in 2 months. He will have ongoing follow-up in 4 months. He does have increasing fatigue which may be disease related. We spent time today again addressing indications to treat CLL.  They were provided reading information at the last  visit which was helpful.  All questions were answered. The patient knows to call the clinic with any problems, questions or concerns. We can certainly see the patient much sooner if necessary.   This document serves as a record of services personally performed by Ancil Linsey, MD. It was created on her behalf by Janace Hoard, a trained medical scribe. The creation of this record is based on the scribe's personal observations and the provider's statements to them. This document has been checked and approved by the attending provider.  I have reviewed the above documentation for accuracy and completeness, and I agree with the above.  This note was electronically signed.  Kelby Fam. Whitney Muse, MD

## 2014-10-17 NOTE — Patient Instructions (Signed)
[facility] at [facility] Discharge Instructions  RECOMMENDATIONS MADE BY THE CONSULTANT AND ANY TEST RESULTS WILL BE SENT TO YOUR REFERRING PHYSICIAN.  Exam and discussion by Dr. Galen Manila. Because your platelets are a little lower we are going to check your blood work in 2 months.  Results for Alfred Brown, Alfred Brown (MRN [id number]) as of 10/17/2014 11:57  Ref. Range 10/10/2014 09:47 10/10/2014 09:48 10/10/2014 09:48  Comment Unknown  Comment   Sodium Latest Ref Range: 135-145 mmol/L 140    Potassium Latest Ref Range: 3.5-5.1 mmol/L 4.0    Chloride Latest Ref Range: 101-111 mmol/L 108    CO2 Latest Ref Range: 22-32 mmol/L 26    BUN Latest Ref Range: 6-20 mg/dL 19    Creatinine Latest Ref Range: 0.61-1.24 mg/dL 1.02    Calcium Latest Ref Range: 8.9-10.3 mg/dL 8.9    EGFR (Non-African Amer.) Latest Ref Range: >60 mL/min >60    EGFR (African American) Latest Ref Range: >60 mL/min >60    Glucose Latest Ref Range: 65-99 mg/dL 839 (H)    Anion gap Latest Ref Range: 5-15  6    Alkaline Phosphatase Latest Ref Range: 38-126 U/L 126    Albumin Latest Ref Range: 3.5-5.0 g/dL 4.5    Albumin/Globulin Ratio Latest Ref Range: 0.7-1.7   1.8 (H)   AST Latest Ref Range: 15-41 U/L 23    ALT Latest Ref Range: 17-63 U/L 16 (L)    Total Protein Latest Ref Range: 6.5-8.1 g/dL 6.5    Total Bilirubin Latest Ref Range: 0.3-1.2 mg/dL 1.0    LDH Latest Ref Range: 98-192 U/L 159    Total Protein ELP Latest Ref Range: 6.0-8.5 g/dL  5.9 (L) 5.8 (L)  Albumin ELP Latest Ref Range: 2.9-4.4 g/dL  3.8   Globulin, Total Latest Ref Range: 2.2-3.9 g/dL  2.1 (L)   DSDPC-9-BYSNKBWL Latest Ref Range: 0.0-0.4 g/dL  0.2   PTRPB-6-PHEWUCOA Latest Ref Range: 0.4-1.0 g/dL  0.6   Beta Globulin Latest Ref Range: 0.7-1.3 g/dL  0.8   Gamma Globulin Latest Ref Range: 0.4-1.8 g/dL  0.5   M-SPIKE, % Latest Ref Range: Not Observed g/dL  Not Observed   SPE Interp. Unknown  Comment   IgG (Immunoglobin G), Serum Latest Ref  Range: [phone] mg/dL 835 (L)  514 (L)  IgA Latest Ref Range: 61-437 mg/dL 41 (L)  42 (L)  IgM, Serum Latest Ref Range: 20-172 mg/dL <6 (L)  <6 (L)  WBC Latest Ref Range: 4.0-10.5 K/uL 122.4 (HH)    RBC Latest Ref Range: 4.22-5.81 MIL/uL 4.95    Hemoglobin Latest Ref Range: 13.0-17.0 g/dL 29.5    HCT Latest Ref Range: 39.0-52.0 % 46.0    MCV Latest Ref Range: 78.0-100.0 fL 92.9    MCH Latest Ref Range: 26.0-34.0 pg 30.1    MCHC Latest Ref Range: 30.0-36.0 g/dL 70.8    RDW Latest Ref Range: 11.5-15.5 % 13.8    Platelets Latest Ref Range: 150-400 K/uL 138 (L)    Neutrophils Latest Ref Range: 43-77 % 4 (L)    Lymphocytes Latest Ref Range: 12-46 % 94 (H)    Monocytes Relative Latest Ref Range: 3-12 % 2 (L)    Eosinophil Latest Ref Range: 0-5 % 0    Basophil Latest Ref Range: 0-1 % 0    NEUT# Latest Ref Range: 1.7-7.7 K/uL 5.5    Lymphocyte # Latest Ref Range: 0.7-4.0 K/uL 114.4 (H)    Monocyte # Latest Ref Range: 0.1-1.0 K/uL 1.9 (H)  Eosinophils Absolute Latest Ref Range: 0.0-0.7 K/uL 0.3    Basophils Absolute Latest Ref Range: 0.0-0.1 K/uL 0.4 (H)    WBC Morphology Unknown ABSOLUTE LYMPHOCY...    Report night sweats, unexplained weight loss or other concerns.  Follow-up Labs in 2 and 4 months and office visit in 4 months.  Thank you for choosing [place] at [facility] to provide your oncology and hematology care.  To afford each patient quality time with our provider, please arrive at least 15 minutes before your scheduled appointment time.    You need to re-schedule your appointment should you arrive 10 or more minutes late.  We strive to give you quality time with our providers, and arriving late affects you and other patients whose appointments are after yours.  Also, if you no show three or more times for appointments you may be dismissed from the clinic at the providers discretion.     Again, thank you for choosing [facility].  Our hope is  that these requests will decrease the amount of time that you wait before being seen by our physicians.       _____________________________________________________________  Should you have questions after your visit to [facility], please contact our office at [phone] between the hours of 8:30 a.m. and 4:30 p.m.  Voicemails left after 4:30 p.m. will not be returned until the following business day.  For prescription refill requests, have your pharmacy contact our office.

## 2014-12-19 ENCOUNTER — Encounter (HOSPITAL_COMMUNITY): Payer: Medicare Other | Attending: Hematology & Oncology

## 2014-12-19 DIAGNOSIS — I4510 Unspecified right bundle-branch block: Secondary | ICD-10-CM | POA: Insufficient documentation

## 2014-12-19 DIAGNOSIS — F418 Other specified anxiety disorders: Secondary | ICD-10-CM | POA: Insufficient documentation

## 2014-12-19 DIAGNOSIS — C9110 Chronic lymphocytic leukemia of B-cell type not having achieved remission: Secondary | ICD-10-CM

## 2014-12-19 DIAGNOSIS — I445 Left posterior fascicular block: Secondary | ICD-10-CM | POA: Insufficient documentation

## 2014-12-19 DIAGNOSIS — J449 Chronic obstructive pulmonary disease, unspecified: Secondary | ICD-10-CM | POA: Insufficient documentation

## 2014-12-19 DIAGNOSIS — F17210 Nicotine dependence, cigarettes, uncomplicated: Secondary | ICD-10-CM | POA: Diagnosis not present

## 2014-12-19 DIAGNOSIS — D801 Nonfamilial hypogammaglobulinemia: Secondary | ICD-10-CM | POA: Diagnosis not present

## 2014-12-19 DIAGNOSIS — M1990 Unspecified osteoarthritis, unspecified site: Secondary | ICD-10-CM | POA: Diagnosis not present

## 2014-12-19 DIAGNOSIS — I358 Other nonrheumatic aortic valve disorders: Secondary | ICD-10-CM | POA: Diagnosis not present

## 2014-12-19 LAB — CBC WITH DIFFERENTIAL/PLATELET
BASOS ABS: 0 10*3/uL (ref 0.0–0.1)
Basophils Relative: 0 %
EOS ABS: 0 10*3/uL (ref 0.0–0.7)
Eosinophils Relative: 0 %
HEMATOCRIT: 45.7 % (ref 39.0–52.0)
Hemoglobin: 14.8 g/dL (ref 13.0–17.0)
LYMPHS ABS: 114.9 10*3/uL — AB (ref 0.7–4.0)
Lymphocytes Relative: 93 %
MCH: 30.7 pg (ref 26.0–34.0)
MCHC: 32.4 g/dL (ref 30.0–36.0)
MCV: 94.8 fL (ref 78.0–100.0)
MONO ABS: 2.5 10*3/uL — AB (ref 0.1–1.0)
MONOS PCT: 2 %
Neutro Abs: 6.2 10*3/uL (ref 1.7–7.7)
Neutrophils Relative %: 5 %
PLATELETS: 155 10*3/uL (ref 150–400)
RBC: 4.82 MIL/uL (ref 4.22–5.81)
RDW: 13.7 % (ref 11.5–15.5)
WBC: 123.6 10*3/uL — AB (ref 4.0–10.5)

## 2014-12-19 LAB — COMPREHENSIVE METABOLIC PANEL
ALBUMIN: 4.3 g/dL (ref 3.5–5.0)
ALT: 15 U/L — ABNORMAL LOW (ref 17–63)
AST: 22 U/L (ref 15–41)
Alkaline Phosphatase: 113 U/L (ref 38–126)
Anion gap: 4 — ABNORMAL LOW (ref 5–15)
BUN: 17 mg/dL (ref 6–20)
CHLORIDE: 109 mmol/L (ref 101–111)
CO2: 26 mmol/L (ref 22–32)
Calcium: 8.5 mg/dL — ABNORMAL LOW (ref 8.9–10.3)
Creatinine, Ser: 0.88 mg/dL (ref 0.61–1.24)
GFR calc Af Amer: >60 mL/min (ref >60)
GFR calc non Af Amer: >60 mL/min (ref >60)
GLUCOSE: 107 mg/dL — AB (ref 65–99)
POTASSIUM: 4.3 mmol/L (ref 3.5–5.1)
Sodium: 139 mmol/L (ref 135–145)
Total Bilirubin: 0.4 mg/dL (ref 0.3–1.2)
Total Protein: 6.5 g/dL (ref 6.5–8.1)

## 2014-12-19 LAB — LACTATE DEHYDROGENASE: LDH: 161 U/L (ref 98–192)

## 2014-12-19 NOTE — Progress Notes (Signed)
CRITICAL VALUE ALERT Critical value received:  WBC 123.6 Date of notification:  12/19/14 Time of notification: 1203 Critical value read back:  Yes.   Nurse who received alert:  T.New,RN MD notified (1st page):  Verbal Dr.Penland [phone]

## 2014-12-19 NOTE — Progress Notes (Signed)
Labs drawn

## 2015-01-02 DIAGNOSIS — Z23 Encounter for immunization: Secondary | ICD-10-CM | POA: Diagnosis not present

## 2015-02-17 ENCOUNTER — Ambulatory Visit (HOSPITAL_COMMUNITY): Payer: Medicare Other | Admitting: Hematology & Oncology

## 2015-02-17 ENCOUNTER — Encounter (HOSPITAL_COMMUNITY): Payer: Medicare Other

## 2015-02-20 ENCOUNTER — Encounter (HOSPITAL_COMMUNITY): Payer: Medicare Other | Attending: Hematology & Oncology | Admitting: Hematology & Oncology

## 2015-02-20 ENCOUNTER — Encounter (HOSPITAL_COMMUNITY): Payer: Self-pay | Admitting: Hematology & Oncology

## 2015-02-20 ENCOUNTER — Encounter (HOSPITAL_BASED_OUTPATIENT_CLINIC_OR_DEPARTMENT_OTHER): Payer: Medicare Other

## 2015-02-20 ENCOUNTER — Telehealth (HOSPITAL_COMMUNITY): Payer: Self-pay

## 2015-02-20 VITALS — BP 130/62 | HR 69 | Temp 98.4°F | Resp 20 | Wt 182.3 lb

## 2015-02-20 DIAGNOSIS — C9110 Chronic lymphocytic leukemia of B-cell type not having achieved remission: Secondary | ICD-10-CM

## 2015-02-20 LAB — CBC WITH DIFFERENTIAL/PLATELET
BASOS ABS: 0.5 10*3/uL — AB (ref 0.0–0.1)
Basophils Relative: 0 %
Eosinophils Absolute: 0.4 10*3/uL (ref 0.0–0.7)
Eosinophils Relative: 0 %
HEMATOCRIT: 45.1 % (ref 39.0–52.0)
Hemoglobin: 14.9 g/dL (ref 13.0–17.0)
Lymphocytes Relative: 90 %
Lymphs Abs: 110.4 10*3/uL — ABNORMAL HIGH (ref 0.7–4.0)
MCH: 31 pg (ref 26.0–34.0)
MCHC: 33 g/dL (ref 30.0–36.0)
MCV: 94 fL (ref 78.0–100.0)
MONO ABS: 2.6 10*3/uL — AB (ref 0.1–1.0)
Monocytes Relative: 2 %
Neutro Abs: 8.7 10*3/uL — ABNORMAL HIGH (ref 1.7–7.7)
Neutrophils Relative %: 7 %
Platelets: 149 10*3/uL — ABNORMAL LOW (ref 150–400)
RBC: 4.8 MIL/uL (ref 4.22–5.81)
RDW: 13.8 % (ref 11.5–15.5)
WBC: 122.6 10*3/uL (ref 4.0–10.5)

## 2015-02-20 LAB — COMPREHENSIVE METABOLIC PANEL
ALBUMIN: 4.4 g/dL (ref 3.5–5.0)
ALT: 18 U/L (ref 17–63)
AST: 24 U/L (ref 15–41)
Alkaline Phosphatase: 128 U/L — ABNORMAL HIGH (ref 38–126)
Anion gap: 5 (ref 5–15)
BILIRUBIN TOTAL: 0.7 mg/dL (ref 0.3–1.2)
BUN: 21 mg/dL — AB (ref 6–20)
CHLORIDE: 108 mmol/L (ref 101–111)
CO2: 27 mmol/L (ref 22–32)
CREATININE: 0.9 mg/dL (ref 0.61–1.24)
Calcium: 9.1 mg/dL (ref 8.9–10.3)
GFR calc Af Amer: >60 mL/min (ref >60)
GFR calc non Af Amer: >60 mL/min (ref >60)
GLUCOSE: 101 mg/dL — AB (ref 65–99)
POTASSIUM: 4.6 mmol/L (ref 3.5–5.1)
Sodium: 140 mmol/L (ref 135–145)
TOTAL PROTEIN: 6.5 g/dL (ref 6.5–8.1)

## 2015-02-20 LAB — LACTATE DEHYDROGENASE: LDH: 167 U/L (ref 98–192)

## 2015-02-20 NOTE — Telephone Encounter (Signed)
CRITICAL VALUE ALERT Critical value received:  WBC 122,600 Date of notification:  02/20/15 Time of notification: 1215  Critical value read back:  Yes.   Nurse who received alert:  Mickie Kay, RN MD notified (1st page):  Dr. Whitney Muse

## 2015-02-20 NOTE — Progress Notes (Signed)
LABS DRAWN

## 2015-02-20 NOTE — Progress Notes (Signed)
[name][address] MD [place] [address]    DIAGNOSIS:   CLL, Stage I with LAD, elevated WBC Hypogammaglobulinemia  Basal cell and Squamous cell carcinoma of skin   CURRENT THERAPY: Observation  INTERVAL HISTORY: Celine Mans 64 y.o. male returns for follow-up of CLL. His major complaint is fatigue. He notes that although he can do all of his required activities during the day he has times when he has to sit and take a short nap, then he can get up and go again. He denies any new pain, no night sweats, no significant change in his appetite. He states his mood is good.   Mr. Quirino is accompanied by his wife and is here for routine follow-up today. We discussed his recent elevated WBC count and the "implication" of these counts.  Denies chest pain, abdominal pain, diaphoresis, or breathing issues. His appetite has been good. He does note intermittent leg swelling that resolves on its own. He has been taking care of his wife lately. He received his flu shot already this year.   His last echocardiogram for his systolic murmur was "years ago".  He and his wife do not have living wills in place. They were given information about advanced directives.   MEDICAL HISTORY: Past Medical History  Diagnosis Date  . CLL (chronic lymphocytic leukemia)   . COPD (chronic obstructive pulmonary disease)   . CMV (cytomegalovirus)   . Depression with anxiety   . DJD (degenerative joint disease)   . Lipoma     left chest wall  . Right bundle branch block     + left posterior fascicular block  . GERD (gastroesophageal reflux disease)   . Hyperlipidemia   . Palpitations 2004    PVCs; borderline stress nuclear in 2004, negative in 2007; Echo 2007; AV-sclerotic, very mild AI  . Aortic valve disorder     Mild insufficiency  . Tobacco abuse     80 pack years    has GERD (gastroesophageal reflux disease); Hyperlipidemia; Palpitations; Chronic lymphocytic leukemia  ([place]); Aortic valve disorder; Right bundle branch block; and Tobacco abuse on his problem list.     has No Known Allergies.  Mr. Knobel had no medications administered during this visit.  SURGICAL HISTORY: Past Surgical History  Procedure Laterality Date  . Rotator cuff repair  1997    right  . Ganglion cyst excision  1997    left wrist  . Colonoscopy  01/2012    Negative screening study  . Shoulder arthroscopy with subacromial decompression  04/09/2012    Procedure: SHOULDER ARTHROSCOPY WITH SUBACROMIAL DECOMPRESSION;  Surgeon: Ninetta Lights, MD;  Location: [facility];  Service: Orthopedics;  Laterality: Left;  LEFT SHOULDER ARTHROSCOPY, SUBACROMIAL DECOMPRESSION, PARTIAL ACROMIOPLASTY WITH CORACOACROMIAL RELEASE, DISTAL CLAVICULECTOMY WITH ROTATOR CUFF REPAIR, DEBRIDEMENT OF LABRUM    SOCIAL HISTORY: Social History   Social History  . Marital Status: Married    Spouse Name: N/A  . Number of Children: N/A  . Years of Education: N/A   Occupational History  . Not on file.   Social History Main Topics  . Smoking status: Current Every Day Smoker -- 1.00 packs/day for 45 years    Types: Cigarettes  . Smokeless tobacco: Never Used     Comment: Consumption decreased approximately 2010 to one pack per day  . Alcohol Use: No  . Drug Use: No  . Sexual Activity: Not on file   Other Topics Concern  .  Not on file   Social History Narrative    FAMILY HISTORY: Family History  Problem Relation Age of Onset  . Stroke Mother   . Heart attack Father   . Cancer Sister     colon  . Colon cancer Sister   . Cancer Brother     2 brothers died with lung cancer    Review of Systems  Constitutional: Negative for fever, chills, malaise/fatigue, and weight loss.  HENT: Negative for congestion, hearing loss, nosebleeds, sore throat and tinnitus.   Eyes: Negative for blurred vision, double vision, pain and discharge.  Respiratory: Negative for cough, hemoptysis, sputum  production, shortness of breath and wheezing.   Cardiovascular: Negative for chest pain, palpitations, claudication, leg swelling and PND.  Gastrointestinal: Negative for heartburn, nausea, vomiting, abdominal pain, diarrhea, constipation, blood in stool and melena.  Genitourinary: Negative for dysuria, urgency, frequency and hematuria.  Musculoskeletal: Positive for joint pain. Negative for myalgias and falls.  Skin: Negative for itching and rash.  Neurological: Negative for dizziness, tingling, tremors, sensory change, speech change, focal weakness, seizures, loss of consciousness, weakness and headaches.  Endo/Heme/Allergies: Does not bruise/bleed easily.  Psychiatric/Behavioral: Negative for depression, suicidal ideas, memory loss and substance abuse. The patient is not nervous/anxious and does not have insomnia.   14 point review of systems was performed and is negative except as detailed under history of present illness and above   PHYSICAL EXAMINATION  ECOG PERFORMANCE STATUS: 1 - Symptomatic but completely ambulatory  Filed Vitals:   02/20/15 1156  BP: 130/62  Pulse: 69  Temp: 98.4 F (36.9 C)  Resp: 20    Physical Exam  Constitutional: He is oriented to person, place, and time and well-developed, well-nourished, and in no distress.  HENT:  Head: Normocephalic and atraumatic.  Nose: Nose normal.  Mouth/Throat: Oropharynx is clear and moist. No oropharyngeal exudate.  Eyes: Conjunctivae and EOM are normal. Pupils are equal, round, and reactive to light. Right eye exhibits no discharge. Left eye exhibits no discharge. No scleral icterus.  Neck: Normal range of motion. Neck supple. No tracheal deviation present. No thyromegaly present.  Cardiovascular: Normal rate, regular rhythm.  Exam reveals no gallop and no friction rub.   Systolic ejection murmur heard. Pulmonary/Chest: Effort normal and breath sounds normal. He has no wheezes. He has no rales.  Abdominal: Soft. Bowel  sounds are normal. He exhibits no distension and no mass. There is no tenderness. There is no rebound and no guarding.  Musculoskeletal: Normal range of motion. He exhibits no edema.  Lymphadenopathy:       Head (right side): Submandibular adenopathy present.       Head (left side): Submandibular adenopathy present.    He has cervical adenopathy.       Right cervical: Superficial cervical adenopathy present.       Left cervical: Superficial cervical adenopathy present.  Largest right inguinal node, 3 cm  Neurological: He is alert and oriented to person, place, and time. He has normal reflexes. No cranial nerve deficit. Gait normal. Coordination normal.  Skin: Skin is warm and dry. No rash noted.  Psychiatric: Mood, memory, affect and judgment normal.  Nursing note and vitals reviewed.   LABORATORY DATA: I have reviewed the data as listed.  CBC    Component Value Date/Time   WBC 122.6* 02/20/2015 1111   RBC 4.80 02/20/2015 1111   RBC 4.82 06/06/2014 0930   HGB 14.9 02/20/2015 1111   HCT 45.1 02/20/2015 1111   PLT 149*  02/20/2015 1111   MCV 94.0 02/20/2015 1111   MCH 31.0 02/20/2015 1111   MCHC 33.0 02/20/2015 1111   RDW 13.8 02/20/2015 1111   LYMPHSABS 110.4* 02/20/2015 1111   MONOABS 2.6* 02/20/2015 1111   EOSABS 0.4 02/20/2015 1111   BASOSABS 0.5* 02/20/2015 1111    CMP     Component Value Date/Time   NA 140 02/20/2015 1111   K 4.6 02/20/2015 1111   CL 108 02/20/2015 1111   CO2 27 02/20/2015 1111   GLUCOSE 101* 02/20/2015 1111   BUN 21* 02/20/2015 1111   CREATININE 0.90 02/20/2015 1111   CALCIUM 9.1 02/20/2015 1111   PROT 6.5 02/20/2015 1111   ALBUMIN 4.4 02/20/2015 1111   AST 24 02/20/2015 1111   ALT 18 02/20/2015 1111   ALKPHOS 128* 02/20/2015 1111   BILITOT 0.7 02/20/2015 1111   GFRNONAA >60 02/20/2015 1111   GFRAA >60 02/20/2015 1111     ASSESSMENT and THERAPY PLAN:  CLL, Stage I with LAD, elevated WBC Hypogammaglobulinemia  Basal cell and Squamous  cell carcinoma of skin Thrombocytopenia   64 year old male with CLL. Blood counts remain fairly stable.  We spent time today again addressing indications to treat CLL.  They were provided reading information at the last visit which was helpful.   We discussed his recent elevated WBC count and the implication of these counts. WBC count alone is not an indication to treat unless there is a rapid doubling time.  For now we will continue with observation.  He will return for routine follow-up in 6 months with labs. I again reminded him to call with any change in baseline energy, appetite or other concerns.   All questions were answered. The patient knows to call the clinic with any problems, questions or concerns. We can certainly see the patient much sooner if necessary.   This document serves as a record of services personally performed by Ancil Linsey, MD. It was created on her behalf by Arlyce Harman, a trained medical scribe. The creation of this record is based on the scribe's personal observations and the provider's statements to them. This document has been checked and approved by the attending provider.  I have reviewed the above documentation for accuracy and completeness, and I agree with the above.  This note was electronically signed.  Kelby Fam. Whitney Muse, MD

## 2015-02-20 NOTE — Patient Instructions (Addendum)
[place] at [facility] Discharge Instructions  RECOMMENDATIONS MADE BY THE CONSULTANT AND ANY TEST RESULTS WILL BE SENT TO YOUR REFERRING PHYSICIAN.   Exam completed by Dr Whitney Muse today. If any night sweats, increased fatigue, or any new lumps or bumps please call us. Return to see the doctor in 6 months with lab work. Please call the clinic if you have any questions or concerns.   Thank you for choosing [place] at [facility] to provide your oncology and hematology care.  To afford each patient quality time with our provider, please arrive at least 15 minutes before your scheduled appointment time.    You need to re-schedule your appointment should you arrive 10 or more minutes late.  We strive to give you quality time with our providers, and arriving late affects you and other patients whose appointments are after yours.  Also, if you no show three or more times for appointments you may be dismissed from the clinic at the providers discretion.     Again, thank you for choosing [facility].  Our hope is that these requests will decrease the amount of time that you wait before being seen by our physicians.       _____________________________________________________________  Should you have questions after your visit to [facility], please contact our office at [phone] between the hours of 8:30 a.m. and 4:30 p.m.  Voicemails left after 4:30 p.m. will not be returned until the following business day.  For prescription refill requests, have your pharmacy contact our office.

## 2015-06-19 DIAGNOSIS — E782 Mixed hyperlipidemia: Secondary | ICD-10-CM | POA: Diagnosis not present

## 2015-06-19 DIAGNOSIS — Z1389 Encounter for screening for other disorder: Secondary | ICD-10-CM | POA: Diagnosis not present

## 2015-06-19 DIAGNOSIS — Z79899 Other long term (current) drug therapy: Secondary | ICD-10-CM | POA: Diagnosis not present

## 2015-06-19 DIAGNOSIS — E785 Hyperlipidemia, unspecified: Secondary | ICD-10-CM | POA: Diagnosis not present

## 2015-06-19 DIAGNOSIS — C9190 Lymphoid leukemia, unspecified not having achieved remission: Secondary | ICD-10-CM | POA: Diagnosis not present

## 2015-06-19 DIAGNOSIS — C9110 Chronic lymphocytic leukemia of B-cell type not having achieved remission: Secondary | ICD-10-CM | POA: Diagnosis not present

## 2015-06-19 DIAGNOSIS — J209 Acute bronchitis, unspecified: Secondary | ICD-10-CM | POA: Diagnosis not present

## 2015-06-19 DIAGNOSIS — R42 Dizziness and giddiness: Secondary | ICD-10-CM | POA: Diagnosis not present

## 2015-06-19 DIAGNOSIS — E538 Deficiency of other specified B group vitamins: Secondary | ICD-10-CM | POA: Diagnosis not present

## 2015-06-19 DIAGNOSIS — Z6824 Body mass index (BMI) 24.0-24.9, adult: Secondary | ICD-10-CM | POA: Diagnosis not present

## 2015-06-19 DIAGNOSIS — J0100 Acute maxillary sinusitis, unspecified: Secondary | ICD-10-CM | POA: Diagnosis not present

## 2015-06-19 DIAGNOSIS — Z125 Encounter for screening for malignant neoplasm of prostate: Secondary | ICD-10-CM | POA: Diagnosis not present

## 2015-06-19 DIAGNOSIS — J449 Chronic obstructive pulmonary disease, unspecified: Secondary | ICD-10-CM | POA: Diagnosis not present

## 2015-06-19 DIAGNOSIS — R5383 Other fatigue: Secondary | ICD-10-CM | POA: Diagnosis not present

## 2015-06-19 DIAGNOSIS — E781 Pure hyperglyceridemia: Secondary | ICD-10-CM | POA: Diagnosis not present

## 2015-07-05 ENCOUNTER — Emergency Department (HOSPITAL_COMMUNITY)
Admission: EM | Admit: 2015-07-05 | Discharge: 2015-07-05 | Disposition: A | Discharge status: Home / Self Care | Payer: Medicare Other | Attending: Emergency Medicine | Admitting: Emergency Medicine

## 2015-07-05 ENCOUNTER — Emergency Department (HOSPITAL_COMMUNITY): Payer: Medicare Other

## 2015-07-05 ENCOUNTER — Encounter (HOSPITAL_COMMUNITY): Payer: Self-pay | Admitting: Emergency Medicine

## 2015-07-05 DIAGNOSIS — E782 Mixed hyperlipidemia: Secondary | ICD-10-CM | POA: Diagnosis not present

## 2015-07-05 DIAGNOSIS — E785 Hyperlipidemia, unspecified: Secondary | ICD-10-CM | POA: Diagnosis not present

## 2015-07-05 DIAGNOSIS — R002 Palpitations: Secondary | ICD-10-CM

## 2015-07-05 DIAGNOSIS — Z6823 Body mass index (BMI) 23.0-23.9, adult: Secondary | ICD-10-CM | POA: Diagnosis not present

## 2015-07-05 DIAGNOSIS — J449 Chronic obstructive pulmonary disease, unspecified: Secondary | ICD-10-CM | POA: Diagnosis not present

## 2015-07-05 DIAGNOSIS — Z79899 Other long term (current) drug therapy: Secondary | ICD-10-CM | POA: Insufficient documentation

## 2015-07-05 DIAGNOSIS — R079 Chest pain, unspecified: Secondary | ICD-10-CM | POA: Diagnosis present

## 2015-07-05 DIAGNOSIS — Z1389 Encounter for screening for other disorder: Secondary | ICD-10-CM | POA: Diagnosis not present

## 2015-07-05 DIAGNOSIS — F17210 Nicotine dependence, cigarettes, uncomplicated: Secondary | ICD-10-CM | POA: Diagnosis not present

## 2015-07-05 DIAGNOSIS — Z7982 Long term (current) use of aspirin: Secondary | ICD-10-CM | POA: Diagnosis not present

## 2015-07-05 DIAGNOSIS — I498 Other specified cardiac arrhythmias: Secondary | ICD-10-CM | POA: Diagnosis not present

## 2015-07-05 DIAGNOSIS — R0789 Other chest pain: Secondary | ICD-10-CM | POA: Diagnosis not present

## 2015-07-05 LAB — URINALYSIS, ROUTINE W REFLEX MICROSCOPIC
BILIRUBIN URINE: NEGATIVE
Glucose, UA: NEGATIVE mg/dL
Ketones, ur: NEGATIVE mg/dL
Leukocytes, UA: NEGATIVE
Nitrite: NEGATIVE
PROTEIN: NEGATIVE mg/dL
Specific Gravity, Urine: 1.015 (ref 1.005–1.030)
pH: 5.5 (ref 5.0–8.0)

## 2015-07-05 LAB — CBC WITH DIFFERENTIAL/PLATELET
BASOS ABS: 0.4 10*3/uL — AB (ref 0.0–0.1)
Basophils Relative: 0 %
Eosinophils Absolute: 0.2 10*3/uL (ref 0.0–0.7)
Eosinophils Relative: 0 %
HEMATOCRIT: 46.8 % (ref 39.0–52.0)
HEMOGLOBIN: 15 g/dL (ref 13.0–17.0)
LYMPHS PCT: 92 %
Lymphs Abs: 133 10*3/uL — ABNORMAL HIGH (ref 0.7–4.0)
MCH: 30.4 pg (ref 26.0–34.0)
MCHC: 32.1 g/dL (ref 30.0–36.0)
MCV: 94.7 fL (ref 78.0–100.0)
Monocytes Absolute: 2.3 10*3/uL — ABNORMAL HIGH (ref 0.1–1.0)
Monocytes Relative: 2 %
NEUTROS ABS: 8.4 10*3/uL — AB (ref 1.7–7.7)
NEUTROS PCT: 6 %
Platelets: 151 10*3/uL (ref 150–400)
RBC: 4.94 MIL/uL (ref 4.22–5.81)
RDW: 14.3 % (ref 11.5–15.5)
WBC: 145.6 10*3/uL — AB (ref 4.0–10.5)

## 2015-07-05 LAB — URINE MICROSCOPIC-ADD ON

## 2015-07-05 LAB — TROPONIN I: Troponin I: <0.03 ng/mL (ref <0.031)

## 2015-07-05 LAB — BASIC METABOLIC PANEL
ANION GAP: 8 (ref 5–15)
BUN: 18 mg/dL (ref 6–20)
CALCIUM: 9.2 mg/dL (ref 8.9–10.3)
CO2: 26 mmol/L (ref 22–32)
Chloride: 108 mmol/L (ref 101–111)
Creatinine, Ser: 0.92 mg/dL (ref 0.61–1.24)
GFR calc non Af Amer: >60 mL/min (ref >60)
Glucose, Bld: 102 mg/dL — ABNORMAL HIGH (ref 65–99)
Potassium: 4.4 mmol/L (ref 3.5–5.1)
Sodium: 142 mmol/L (ref 135–145)

## 2015-07-05 LAB — PHOSPHORUS: PHOSPHORUS: 3.4 mg/dL (ref 2.5–4.6)

## 2015-07-05 LAB — BRAIN NATRIURETIC PEPTIDE: B NATRIURETIC PEPTIDE 5: 14 pg/mL (ref 0.0–100.0)

## 2015-07-05 LAB — D-DIMER, QUANTITATIVE: D-Dimer, Quant: 0.32 ug/mL-FEU (ref 0.00–0.50)

## 2015-07-05 LAB — MAGNESIUM: Magnesium: 1.9 mg/dL (ref 1.7–2.4)

## 2015-07-05 NOTE — ED Provider Notes (Signed)
CSN: [id number]     Arrival date & time 07/05/15  1600 History   First MD Initiated Contact with Patient 07/05/15 1711     Chief Complaint  Patient presents with  . Chest Pain     (Consider location/radiation/quality/duration/timing/severity/associated sxs/prior Treatment) HPI 65 year old male who presents with palpitations. He has history of CLL, currently not receiving any treatment, COPD not on home oxygen, hyperlipidemia, tobacco use, and history of mild aortic insufficiency. States that over the past few days he has had symptoms of feeling pulsations within his chest. Feels as if his heart is fluttering. Pulsations go up into his neck. Denies having any chest pain, despite CC. Occasionally feeling "light" but not lightheaded and does not have any syncope. Finds himself taking deep breaths during onset of symptoms, but does not feel SOB. Symptoms occurring primarily at rest, and not with any activity or exertional activities. No cough, fevers or chills, lower extremity edema or pain, orthopnea, PND.   Past Medical History  Diagnosis Date  . CLL (chronic lymphocytic leukemia) ([place])   . COPD (chronic obstructive pulmonary disease) ([place])   . CMV (cytomegalovirus) ([place])   . Depression with anxiety   . DJD (degenerative joint disease)   . Lipoma     left chest wall  . Right bundle branch block     + left posterior fascicular block  . GERD (gastroesophageal reflux disease)   . Hyperlipidemia   . Palpitations 2004    PVCs; borderline stress nuclear in 2004, negative in 2007; Echo 2007; AV-sclerotic, very mild AI  . Aortic valve disorder     Mild insufficiency  . Tobacco abuse     80 pack years   Past Surgical History  Procedure Laterality Date  . Rotator cuff repair  1997    right  . Ganglion cyst excision  1997    left wrist  . Colonoscopy  01/2012    Negative screening study  . Shoulder arthroscopy with subacromial decompression  04/09/2012    Procedure: SHOULDER ARTHROSCOPY  WITH SUBACROMIAL DECOMPRESSION;  Surgeon: Ninetta Lights, MD;  Location: [facility];  Service: Orthopedics;  Laterality: Left;  LEFT SHOULDER ARTHROSCOPY, SUBACROMIAL DECOMPRESSION, PARTIAL ACROMIOPLASTY WITH CORACOACROMIAL RELEASE, DISTAL CLAVICULECTOMY WITH ROTATOR CUFF REPAIR, DEBRIDEMENT OF LABRUM   Family History  Problem Relation Age of Onset  . Stroke Mother   . Heart attack Father   . Cancer Sister     colon  . Colon cancer Sister   . Cancer Brother     2 brothers died with lung cancer   Social History  Substance Use Topics  . Smoking status: Current Every Day Smoker -- 1.00 packs/day for 45 years    Types: Cigarettes  . Smokeless tobacco: Never Used     Comment: Consumption decreased approximately 2010 to one pack per day  . Alcohol Use: No    Review of Systems 10/14 systems reviewed and are negative other than those stated in the HPI   Allergies  Review of patient's allergies indicates no known allergies.  Home Medications   Prior to Admission medications   Medication Sig Start Date End Date Taking? Authorizing Provider  acyclovir (ZOVIRAX) 800 MG tablet Take 800 mg by mouth 5 (five) times daily as needed (outbreak).    Yes Historical Provider, MD  albuterol (PROVENTIL HFA;VENTOLIN HFA) 108 (90 BASE) MCG/ACT inhaler Inhale 2 puffs into the lungs every 6 (six) hours as needed for wheezing or shortness of breath.  Yes Historical Provider, MD  ALPRAZolam Duanne Moron) 1 MG tablet Take 1 mg by mouth 3 (three) times daily as needed for anxiety.    Yes Historical Provider, MD  aspirin 325 MG tablet Take 325 mg by mouth daily.     Yes Historical Provider, MD  buPROPion (WELLBUTRIN SR) 150 MG 12 hr tablet Take 150 mg by mouth daily.    Yes Historical Provider, MD  calcium carbonate (OS-CAL) 600 MG TABS Take 600 mg by mouth daily.     Yes Historical Provider, MD  fish oil-omega-3 fatty acids 1000 MG capsule Take 2 g by mouth daily.     Yes Historical Provider, MD   fluticasone (FLONASE) 50 MCG/ACT nasal spray Place 2 sprays into the nose daily.     Yes Historical Provider, MD  Loratadine 10 MG CAPS Take 10 mg by mouth daily.    Yes Historical Provider, MD  lovastatin (MEVACOR) 20 MG tablet Take 20 mg by mouth daily. 09/29/14  Yes Historical Provider, MD  Multiple Vitamin (MULTIVITAMIN) tablet Take 1 tablet by mouth daily.     Yes Historical Provider, MD  niacin (NIASPAN) 500 MG CR tablet Take 500 mg by mouth daily. 09/29/14  Yes Historical Provider, MD  clotrimazole-betamethasone (LOTRISONE) cream Apply 1 application topically daily as needed. Affected area 06/19/15   Historical Provider, MD  Diphenhydramine-PE-APAP 12.5-5-325 MG TABS Take 1 tablet by mouth as needed (sinus). sinus    Historical Provider, MD  HYDROcodone-acetaminophen (NORCO/VICODIN) 5-325 MG per tablet Take 1 tablet by mouth every 6 (six) hours as needed for moderate pain.  08/10/14   Historical Provider, MD  hydrOXYzine (ATARAX/VISTARIL) 25 MG tablet Take 25 mg by mouth every 8 (eight) hours as needed for anxiety or itching.  10/11/14   Historical Provider, MD  naproxen (NAPROSYN) 500 MG tablet Take 500 mg by mouth 2 (two) times daily as needed for mild pain or moderate pain.  12/28/12   Historical Provider, MD   BP 124/54 mmHg  Pulse 59  Temp(Src) 97.8 F (36.6 C) (Oral)  Resp 10  Ht 6' (1.829 m)  Wt 178 lb (80.74 kg)  BMI 24.14 kg/m2  SpO2 96% Physical Exam Physical Exam  Nursing note and vitals reviewed. Constitutional: thin, pale, non-toxic, and in no acute distress Head: Normocephalic and atraumatic.  Mouth/Throat: Oropharynx is clear and moist.  Neck: Normal range of motion. Neck supple.  Cardiovascular: Normal rate and regular rhythm.  No edema. Pulmonary/Chest: Effort normal and breath sounds normal.  Abdominal: Soft. There is no tenderness. There is no rebound and no guarding.  Musculoskeletal: Normal range of motion.  Neurological: Alert, no facial droop, fluent speech,  moves all extremities symmetrically Skin: Skin is warm and dry.  Psychiatric: Cooperative  ED Course  Procedures (including critical care time) Labs Review Labs Reviewed  CBC WITH DIFFERENTIAL/PLATELET - Abnormal; Notable for the following:    WBC 145.6 (*)    Neutro Abs 8.4 (*)    Lymphs Abs 133.0 (*)    Monocytes Absolute 2.3 (*)    Basophils Absolute 0.4 (*)    All other components within normal limits  BASIC METABOLIC PANEL - Abnormal; Notable for the following:    Glucose, Bld 102 (*)    All other components within normal limits  URINALYSIS, ROUTINE W REFLEX MICROSCOPIC (NOT AT [facility]) - Abnormal; Notable for the following:    Hgb urine dipstick TRACE (*)    All other components within normal limits  URINE MICROSCOPIC-ADD ON - Abnormal; Notable  for the following:    Squamous Epithelial / LPF 0-5 (*)    Bacteria, UA RARE (*)    All other components within normal limits  MAGNESIUM  PHOSPHORUS  BRAIN NATRIURETIC PEPTIDE  D-DIMER, QUANTITATIVE (NOT AT [facility]  TROPONIN I  TROPONIN I  I-STAT TROPOININ, ED    Imaging Review Dg Chest 2 View  07/05/2015  CLINICAL DATA:  Chest pain for 2 days, history of leukemia EXAM: CHEST  2 VIEW COMPARISON:  10/28/2011 FINDINGS: The heart size and mediastinal contours are within normal limits. Both lungs are clear. The visualized skeletal structures are unremarkable. IMPRESSION: No active cardiopulmonary disease. Electronically Signed   By: Inez Catalina M.D.   On: 07/05/2015 18:40   I have personally reviewed and evaluated these images and lab results as part of my medical decision-making.   EKG Interpretation   Date/Time:  Wednesday July 05 2015 21:17:49 EDT Ventricular Rate:  63 PR Interval:  150 QRS Duration: 156 QT Interval:  468 QTC Calculation: 479 R Axis:   104 Text Interpretation:  Sinus arrhythmia RBBB and LPFB Baseline wander in  lead(s) V6 No change from prior  Confirmed by Diante Barley MD, Gregori Abril ([id number]) on  07/05/2015 9:31:13 PM       MDM   Final diagnoses:  Palpitations  Fluttering heart ([place])    64 year old male who presents with palpitations and fluttering of his heart. He is nontoxic in no acute distress. Vital signs are within normal limits. He does have an episode of his symptoms here in the ED while on the cardiac monitor, and monitor continues to read normal sinus rhythm. His EKG is with right bundle branch block and no stigmata of arrhythmia. Unchanged from prior. No major outside or metabolic derangements. No true chest pain, and symptoms atypical for that of ACS is primarily occurring or the rest and not activity or exertion. Serial troponins are performed and negative and his repeat EKG shows no dynamic changes. His chest x-ray does not show any acute cardiopulmonary processes. A d-dimer was also sent as he is still low risk for PE, his major risk factor is his leukemia. D-dimer negative and presentation seems not suggestive of that of PE. He does have leukocytosis of 140s, which is near baseline for him in the setting of his CLL. No other major electrolyte or metabolic derangements. At this time there is no clear etiology of his symptoms. However I do not feel that he requires inpatient admission for this. Not having any lightheadedness or syncopal symptoms require syncopal workup. Other dangerous or life-threatening etiologies have been evaluated for and negative. He will follow up closely with his PCP for reevaluation and cardiology referral. Strict return instructions are also reviewed. He expressed understanding of all discharge instructions, and felt comfortable to plan of care.    Forde Dandy, MD 07/06/15 [phone]

## 2015-07-05 NOTE — Discharge Instructions (Signed)
Chest x-ray was negative. Your blood work was not concerning for that of a blood clot or evidence of heart attack or heart strain. you will need to continue to follow-up with your oncologist and your primary care doctor closely. You may require referral to a cardiologist through your primary care doctor to get an ultrasound of the heart. Return without fail for worsening symptoms including severe chest pain, shortness of breath, passing out or feeling like you are going to pass out, or any other symptoms concerning to you.  Palpitations A palpitation is the feeling that your heartbeat is irregular. It may feel like your heart is fluttering or skipping a beat. It may also feel like your heart is beating faster than normal. This is usually not a serious problem. In some cases, you may need more medical tests. HOME CARE  Avoid:  Caffeine in coffee, tea, soft drinks, diet pills, and energy drinks.  Chocolate.  Alcohol.  Stop smoking if you smoke.  Reduce your stress and anxiety. Try:  A method that measures bodily functions so you can learn to control them (biofeedback).  Yoga.  Meditation.  Physical activity such as swimming, jogging, or walking.  Get plenty of rest and sleep. GET HELP IF:  Your fast or irregular heartbeat continues after 24 hours.  Your palpitations occur more often. GET HELP RIGHT AWAY IF:   You have chest pain.  You feel short of breath.  You have a very bad headache.  You feel dizzy or pass out (faint). MAKE SURE YOU:   Understand these instructions.  Will watch your condition.  Will get help right away if you are not doing well or get worse.   This information is not intended to replace advice given to you by your health care provider. Make sure you discuss any questions you have with your health care provider.   Document Released: 12/26/2007 Document Revised: 04/08/2014 Document Reviewed: 05/17/2011 Elsevier Interactive Patient Education [employer]

## 2015-07-05 NOTE — ED Notes (Signed)
Pt ambulated with minimal assistance and denied any sob or dizziness. Pt 02 sat stayed at 100%.

## 2015-07-05 NOTE — ED Notes (Signed)
Patient states chest pain x 2 days. Patient was told to come ED by Dr. Gerarda Fraction. Patient is cancer patient Leukemia. States SOB. Denies nausea/vomiting.

## 2015-07-05 NOTE — ED Notes (Signed)
CRITICAL VALUE ALERT  Critical value received:  WBC 145.6  Date of notification: 07/05/2015  Time of notification:  9983    Nurse who received alert:  Susa Day  MD notified (1st page): Dr. Oleta Mouse  Time of first page:    MD notified (2nd page):  Time of second page:  Responding MD:  Time MD responded:

## 2015-07-13 DIAGNOSIS — Z1389 Encounter for screening for other disorder: Secondary | ICD-10-CM | POA: Diagnosis not present

## 2015-07-13 DIAGNOSIS — R002 Palpitations: Secondary | ICD-10-CM | POA: Diagnosis not present

## 2015-07-13 DIAGNOSIS — R0789 Other chest pain: Secondary | ICD-10-CM | POA: Diagnosis not present

## 2015-07-13 DIAGNOSIS — E782 Mixed hyperlipidemia: Secondary | ICD-10-CM | POA: Diagnosis not present

## 2015-07-13 DIAGNOSIS — Z6823 Body mass index (BMI) 23.0-23.9, adult: Secondary | ICD-10-CM | POA: Diagnosis not present

## 2015-07-13 DIAGNOSIS — C9110 Chronic lymphocytic leukemia of B-cell type not having achieved remission: Secondary | ICD-10-CM | POA: Diagnosis not present

## 2015-07-13 DIAGNOSIS — Q231 Congenital insufficiency of aortic valve: Secondary | ICD-10-CM | POA: Diagnosis not present

## 2015-07-13 DIAGNOSIS — K219 Gastro-esophageal reflux disease without esophagitis: Secondary | ICD-10-CM | POA: Diagnosis not present

## 2015-08-14 ENCOUNTER — Encounter: Payer: Self-pay | Admitting: Cardiology

## 2015-08-14 ENCOUNTER — Ambulatory Visit (INDEPENDENT_AMBULATORY_CARE_PROVIDER_SITE_OTHER): Payer: Medicare Other | Admitting: Cardiology

## 2015-08-14 VITALS — BP 128/64 | HR 69 | Ht 72.0 in | Wt 176.0 lb

## 2015-08-14 DIAGNOSIS — Q231 Congenital insufficiency of aortic valve: Secondary | ICD-10-CM | POA: Diagnosis not present

## 2015-08-14 DIAGNOSIS — R002 Palpitations: Secondary | ICD-10-CM

## 2015-08-14 NOTE — Progress Notes (Signed)
Patient ID: Alfred Brown, male   DOB: [birth year], 65 y.o.   MRN: [id number]     Clinical Summary Alfred Brown is a 65 y.o.male seen today as a new patient, he is referred by Dr Gerarda Fraction for the following medical problems. He was previously seen by Dr Lattie Haw over 3 years ago  1. Papitations - long history of symtpoms.  -  Can feel heart skipping at times. Can occur at rest or with exertion. No other associated symptoms. Occurs 3-4 times a month. Lasts anywhere from 10 minutes to several hours - coffee x 2.5 cups, no tea, coke/pepsi/sundrop x 2 bottles per day, no energy drinks, no EtOH.    2. Bicuspid aortic valve - echo without significant valvular dysfunction - CTA chest 12/2004 without significant aortopathy.     Past Medical History  Diagnosis Date  . CLL (chronic lymphocytic leukemia) ([place])   . COPD (chronic obstructive pulmonary disease) ([place])   . CMV (cytomegalovirus) ([place])   . Depression with anxiety   . DJD (degenerative joint disease)   . Lipoma     left chest wall  . Right bundle branch block     + left posterior fascicular block  . GERD (gastroesophageal reflux disease)   . Hyperlipidemia   . Palpitations 2004    PVCs; borderline stress nuclear in 2004, negative in 2007; Echo 2007; AV-sclerotic, very mild AI  . Aortic valve disorder     Mild insufficiency  . Tobacco abuse     80 pack years     No Known Allergies   Current Outpatient Prescriptions  Medication Sig Dispense Refill  . acyclovir (ZOVIRAX) 800 MG tablet Take 800 mg by mouth 5 (five) times daily as needed (outbreak).     [name] albuterol (PROVENTIL HFA;VENTOLIN HFA) 108 (90 BASE) MCG/ACT inhaler Inhale 2 puffs into the lungs every 6 (six) hours as needed for wheezing or shortness of breath.     . ALPRAZolam (XANAX) 1 MG tablet Take 1 mg by mouth 3 (three) times daily as needed for anxiety.     [name] aspirin 325 MG tablet Take 325 mg by mouth daily.      [name] buPROPion (WELLBUTRIN SR) 150 MG 12 hr tablet Take 150  mg by mouth daily.     . calcium carbonate (OS-CAL) 600 MG TABS Take 600 mg by mouth daily.      . clotrimazole-betamethasone (LOTRISONE) cream Apply 1 application topically daily as needed. Affected area    . Diphenhydramine-PE-APAP 12.5-5-325 MG TABS Take 1 tablet by mouth as needed (sinus). sinus    . fish oil-omega-3 fatty acids 1000 MG capsule Take 2 g by mouth daily.      . fluticasone (FLONASE) 50 MCG/ACT nasal spray Place 2 sprays into the nose daily.      [name] HYDROcodone-acetaminophen (NORCO/VICODIN) 5-325 MG per tablet Take 1 tablet by mouth every 6 (six) hours as needed for moderate pain.     . hydrOXYzine (ATARAX/VISTARIL) 25 MG tablet Take 25 mg by mouth every 8 (eight) hours as needed for anxiety or itching.     . Loratadine 10 MG CAPS Take 10 mg by mouth daily.     [name] lovastatin (MEVACOR) 20 MG tablet Take 20 mg by mouth daily.    . Multiple Vitamin (MULTIVITAMIN) tablet Take 1 tablet by mouth daily.      . naproxen (NAPROSYN) 500 MG tablet Take 500 mg by mouth 2 (two) times daily as needed for mild pain or moderate pain.     [name]  niacin (NIASPAN) 500 MG CR tablet Take 500 mg by mouth daily.     No current facility-administered medications for this visit.     Past Surgical History  Procedure Laterality Date  . Rotator cuff repair  1997    right  . Ganglion cyst excision  1997    left wrist  . Colonoscopy  01/2012    Negative screening study  . Shoulder arthroscopy with subacromial decompression  04/09/2012    Procedure: SHOULDER ARTHROSCOPY WITH SUBACROMIAL DECOMPRESSION;  Surgeon: Ninetta Lights, MD;  Location: [place];  Service: Orthopedics;  Laterality: Left;  LEFT SHOULDER ARTHROSCOPY, SUBACROMIAL DECOMPRESSION, PARTIAL ACROMIOPLASTY WITH CORACOACROMIAL RELEASE, DISTAL CLAVICULECTOMY WITH ROTATOR CUFF REPAIR, DEBRIDEMENT OF LABRUM     No Known Allergies    Family History  Problem Relation Age of Onset  . Stroke Mother   . Heart attack Father   .  Cancer Sister     colon  . Colon cancer Sister   . Cancer Brother     2 brothers died with lung cancer     Social History Alfred Brown reports that he has been smoking Cigarettes.  He has a 45 pack-year smoking history. He has never used smokeless tobacco. Alfred Brown reports that he does not drink alcohol.   Review of Systems CONSTITUTIONAL: No weight loss, fever, chills, weakness or fatigue.  HEENT: Eyes: No visual loss, blurred vision, double vision or yellow sclerae.No hearing loss, sneezing, congestion, runny nose or sore throat.  SKIN: No rash or itching.  CARDIOVASCULAR: per HPI RESPIRATORY: No shortness of breath, cough or sputum.  GASTROINTESTINAL: No anorexia, nausea, vomiting or diarrhea. No abdominal pain or blood.  GENITOURINARY: No burning on urination, no polyuria NEUROLOGICAL: No headache, dizziness, syncope, paralysis, ataxia, numbness or tingling in the extremities. No change in bowel or bladder control.  MUSCULOSKELETAL: No muscle, back pain, joint pain or stiffness.  LYMPHATICS: No enlarged nodes. No history of splenectomy.  PSYCHIATRIC: No history of depression or anxiety.  ENDOCRINOLOGIC: No reports of sweating, cold or heat intolerance. No polyuria or polydipsia.  [name]   Physical Examination Filed Vitals:   08/14/15 1333  BP: 128/64  Pulse: 69   Filed Vitals:   08/14/15 1333  Height: 6' (1.829 m)  Weight: 176 lb (79.833 kg)    Gen: resting comfortably, no acute distress HEENT: no scleral icterus, pupils equal round and reactive, no palptable cervical adenopathy,  CV: RRR, 2/6 sysotlic murmur RUSB, no JVD Resp: Clear to auscultation bilaterally GI: abdomen is soft, non-tender, non-distended, normal bowel sounds, no hepatosplenomegaly MSK: extremities are warm, no edema.  Skin: warm, no rash Neuro:  no focal deficits Psych: appropriate affect   Diagnostic Studies 09/2005 GXT STRESS DATA: Patient underwent exercise stress testing on the  Bruce Protocol. Baseline EKG showed sinus rhythm at 59 beats per minute, right bundle branch block. Baseline blood pressure was 132/70.  The patient exercised for 7 minutes, 52 seconds to a peak heart rate of 151 which was 90% predicted maximal. Peak blood pressure is 182/68. Patient stopped because of fatigue, experienced no chest pain. EKG showed no significant ST changes to suggest ischemia.  IMPRESSION: Exercise stress test. Clinically and electrically negative for ischemia. Myoview scan pending.   09/2005 echo . Technically adequate echocardiographic study. 2. Normal left atrium, right atrium and right ventricle. 3. Trileaflet aortic valve with mild to moderate sclerosis of the leaflets;  reasonably good leaflet excursion; very mild insufficiency and trivial,  if any, stenosis by  Doppler. 4. Normal diameter of the proximal ascending aorta with mild calcification  of the wall. 5. Mild mitral valve thickening; mild annular calcification. 6. Normal tricuspid and pulmonic valves; normal proximal pulmonary artery. 7. Normal left ventricular size; borderline hypertrophy; normal regional  and global function. 8. Comparison with prior study of January 31, 2003: No significant  interval change.    Assessment and Plan  1. Palpitaitons - baseline EKG shows SR, RBBB, LPFB - we will obtain a 21 day monitor - counseled to cut back on caffeine  2. Bicuspid aortic valve - no significant valvular dysfunction from previous testing. Soft mild murmur on exam. Previous CTA without significaint aortopathy. We will continue to monitor      Arnoldo Lenis, M.D.

## 2015-08-14 NOTE — Patient Instructions (Signed)
Your physician recommends that you schedule a follow-up appointment in: 4-6 [place] has recommended that you wear an event monitor. Event monitors are medical devices that record the heart's electrical activity. Doctors most often [place] these monitors to diagnose arrhythmias. Arrhythmias are problems with the speed or rhythm of the heartbeat. The monitor is a small, portable device. You can wear one while you do your normal daily activities. This is usually used to diagnose what is causing palpitations/syncope (passing out).  If you need a refill on your cardiac medications before your next appointment, please call your pharmacy.  Thank you for choosing [place]!

## 2015-08-18 ENCOUNTER — Ambulatory Visit (INDEPENDENT_AMBULATORY_CARE_PROVIDER_SITE_OTHER): Payer: Medicare Other

## 2015-08-18 DIAGNOSIS — R002 Palpitations: Secondary | ICD-10-CM | POA: Diagnosis not present

## 2015-08-21 ENCOUNTER — Encounter (HOSPITAL_COMMUNITY): Payer: Medicare Other | Attending: Hematology & Oncology | Admitting: Hematology & Oncology

## 2015-08-21 ENCOUNTER — Encounter (HOSPITAL_COMMUNITY): Payer: Medicare Other

## 2015-08-21 ENCOUNTER — Encounter (HOSPITAL_COMMUNITY): Payer: Self-pay | Admitting: Hematology & Oncology

## 2015-08-21 VITALS — BP 124/59 | HR 74 | Temp 98.5°F | Resp 20 | Wt 178.0 lb

## 2015-08-21 DIAGNOSIS — D801 Nonfamilial hypogammaglobulinemia: Secondary | ICD-10-CM | POA: Diagnosis not present

## 2015-08-21 DIAGNOSIS — C4492 Squamous cell carcinoma of skin, unspecified: Secondary | ICD-10-CM | POA: Diagnosis not present

## 2015-08-21 DIAGNOSIS — Z801 Family history of malignant neoplasm of trachea, bronchus and lung: Secondary | ICD-10-CM | POA: Insufficient documentation

## 2015-08-21 DIAGNOSIS — J449 Chronic obstructive pulmonary disease, unspecified: Secondary | ICD-10-CM | POA: Insufficient documentation

## 2015-08-21 DIAGNOSIS — K219 Gastro-esophageal reflux disease without esophagitis: Secondary | ICD-10-CM | POA: Insufficient documentation

## 2015-08-21 DIAGNOSIS — B259 Cytomegaloviral disease, unspecified: Secondary | ICD-10-CM | POA: Diagnosis not present

## 2015-08-21 DIAGNOSIS — F418 Other specified anxiety disorders: Secondary | ICD-10-CM | POA: Insufficient documentation

## 2015-08-21 DIAGNOSIS — Z9889 Other specified postprocedural states: Secondary | ICD-10-CM | POA: Diagnosis not present

## 2015-08-21 DIAGNOSIS — Z8249 Family history of ischemic heart disease and other diseases of the circulatory system: Secondary | ICD-10-CM | POA: Diagnosis not present

## 2015-08-21 DIAGNOSIS — F17210 Nicotine dependence, cigarettes, uncomplicated: Secondary | ICD-10-CM | POA: Diagnosis not present

## 2015-08-21 DIAGNOSIS — E785 Hyperlipidemia, unspecified: Secondary | ICD-10-CM | POA: Diagnosis not present

## 2015-08-21 DIAGNOSIS — C9110 Chronic lymphocytic leukemia of B-cell type not having achieved remission: Secondary | ICD-10-CM

## 2015-08-21 DIAGNOSIS — Z808 Family history of malignant neoplasm of other organs or systems: Secondary | ICD-10-CM | POA: Diagnosis not present

## 2015-08-21 DIAGNOSIS — I4510 Unspecified right bundle-branch block: Secondary | ICD-10-CM | POA: Insufficient documentation

## 2015-08-21 DIAGNOSIS — R5382 Chronic fatigue, unspecified: Secondary | ICD-10-CM

## 2015-08-21 LAB — CBC WITH DIFFERENTIAL/PLATELET
BASOS PCT: 0 %
Basophils Absolute: 0 10*3/uL (ref 0.0–0.1)
EOS PCT: 0 %
Eosinophils Absolute: 0 10*3/uL (ref 0.0–0.7)
HCT: 44.7 % (ref 39.0–52.0)
Hemoglobin: 14 g/dL (ref 13.0–17.0)
LYMPHS ABS: 119.2 10*3/uL — AB (ref 0.7–4.0)
Lymphocytes Relative: 94 %
MCH: 30 pg (ref 26.0–34.0)
MCHC: 31.3 g/dL (ref 30.0–36.0)
MCV: 95.7 fL (ref 78.0–100.0)
MONO ABS: 2.5 10*3/uL — AB (ref 0.1–1.0)
Monocytes Relative: 2 %
NEUTROS ABS: 5.1 10*3/uL (ref 1.7–7.7)
Neutrophils Relative %: 4 %
PLATELETS: 161 10*3/uL (ref 150–400)
RBC: 4.67 MIL/uL (ref 4.22–5.81)
RDW: 14.2 % (ref 11.5–15.5)
WBC: 126.8 10*3/uL (ref 4.0–10.5)

## 2015-08-21 LAB — LACTATE DEHYDROGENASE: LDH: 160 U/L (ref 98–192)

## 2015-08-21 LAB — COMPREHENSIVE METABOLIC PANEL
ALT: 15 U/L — ABNORMAL LOW (ref 17–63)
AST: 21 U/L (ref 15–41)
Albumin: 4.5 g/dL (ref 3.5–5.0)
Alkaline Phosphatase: 119 U/L (ref 38–126)
Anion gap: 4 — ABNORMAL LOW (ref 5–15)
BUN: 18 mg/dL (ref 6–20)
CHLORIDE: 107 mmol/L (ref 101–111)
CO2: 27 mmol/L (ref 22–32)
CREATININE: 0.83 mg/dL (ref 0.61–1.24)
Calcium: 8.8 mg/dL — ABNORMAL LOW (ref 8.9–10.3)
Glucose, Bld: 112 mg/dL — ABNORMAL HIGH (ref 65–99)
POTASSIUM: 4.2 mmol/L (ref 3.5–5.1)
Sodium: 138 mmol/L (ref 135–145)
Total Bilirubin: 0.5 mg/dL (ref 0.3–1.2)
Total Protein: 6.4 g/dL — ABNORMAL LOW (ref 6.5–8.1)

## 2015-08-21 NOTE — Patient Instructions (Addendum)
[place] at [facility] Discharge Instructions  RECOMMENDATIONS MADE BY THE CONSULTANT AND ANY TEST RESULTS WILL BE SENT TO YOUR REFERRING PHYSICIAN.  Return in 6 months for labs and to see Dr. Whitney Muse  Please call Hildred Alamin @ [phone] and ask for [facility]!!!  This is Amy our scheduler's number but please ask for [facility]. If Hildred Alamin is not here please let Amy know your problem so she can go to the doctor with your problem/concern.   Thank you for choosing [place] at [facility] to provide your oncology and hematology care.  To afford each patient quality time with our provider, please arrive at least 15 minutes before your scheduled appointment time.   Beginning January 23rd 2017 lab work for the [facility] will be done in the  Main lab at [facility] on 1st floor. If you have a lab appointment with the [facility] please come in thru the  Main Entrance and check in at the main information desk  You need to re-schedule your appointment should you arrive 10 or more minutes late.  We strive to give you quality time with our providers, and arriving late affects you and other patients whose appointments are after yours.  Also, if you no show three or more times for appointments you may be dismissed from the clinic at the providers discretion.     Again, thank you for choosing [facility].  Our hope is that these requests will decrease the amount of time that you wait before being seen by our physicians.       _____________________________________________________________  Should you have questions after your visit to [facility], please contact our office at [phone] between the hours of 8:30 a.m. and 4:30 p.m.  Voicemails left after 4:30 p.m. will not be returned until the following business day.  For prescription refill requests, have your pharmacy contact our office.         Resources For Cancer Patients and  their Caregivers ? American Cancer Society: Can assist with transportation, wigs, general needs, runs Look Good Feel Better.        317-192-0279 ? Cancer Care: Provides financial assistance, online support groups, medication/co-pay assistance.  1-800-813-HOPE 5392354095) ? [place] Assists [place] cancer patients and their families through emotional , educational and financial support.  4694887695 ? [place] DSS Where to apply for food stamps, Medicaid and utility assistance. (567) 330-1002 ? [facility]: Transportation to medical appointments. 828-294-4320 ? Social Security Administration: May apply for disability if have a Stage IV cancer. 334-512-1600 810 371 2229 ? [employer], Disability and Transit Services: Assists with nutrition, care and transit needs. [place] Support Programs: '@10RELATIVEDAYS'$ @ > Cancer Support Group  2nd Tuesday of the month 1pm-2pm, Journey Room  > Creative Journey  3rd Tuesday of the month 1130am-1pm, Journey Room  > Look Good Feel Better  1st Wednesday of the month 10am-12 noon, Journey Room (Call [place] to register [phone])

## 2015-08-21 NOTE — Progress Notes (Unsigned)
CRITICAL VALUE ALERT  Critical value received:  WBC 126.8  Date of notification:  08/21/15  Time of notification:  1104  Critical value read back:Yes.    Nurse who received alert:  A. Ouida Sills, RN  MD notified:  Caroleen Hamman, PA-C at [phone]

## 2015-08-21 NOTE — Progress Notes (Signed)
[name]., MD [place] [address]    DIAGNOSIS:   CLL, Stage I with LAD, elevated WBC Hypogammaglobulinemia  Basal cell and Squamous cell carcinoma of skin   CURRENT THERAPY: Observation  INTERVAL HISTORY: Alfred Brown 65 y.o. male returns for follow-up of CLL. His major complaint is fatigue. He notes that although he can do all of his required activities during the day he has times when he has to sit and take a short nap, then he can get up and go again. He denies any new pain, no night sweats, no significant change in his appetite. He states his mood is good.   Alfred Brown is accompanied by his wife and here for routine follow up today. I personally reviewed and went over laboratory studies with the patient.  He is wearing a 21 day heart monitor. Has just seen Dr. Harl Bowie. He notes palpitations. He states however, "of course I haven't had any since wearing this."  Appetite is fine. Notes "I still have my moments" in reference to energy levels, explaining his energy has "wore off a little bit". He has no other complaints. Denies drenching night sweats. No new adenopathy. Sleeps fairly well.    MEDICAL HISTORY: Past Medical History  Diagnosis Date  . CLL (chronic lymphocytic leukemia) ([facility])   . COPD (chronic obstructive pulmonary disease) ([place])   . CMV (cytomegalovirus) ([place])   . Depression with anxiety   . DJD (degenerative joint disease)   . Lipoma     left chest wall  . Right bundle branch block     + left posterior fascicular block  . GERD (gastroesophageal reflux disease)   . Hyperlipidemia   . Palpitations 2004    PVCs; borderline stress nuclear in 2004, negative in 2007; Echo 2007; AV-sclerotic, very mild AI  . Aortic valve disorder     Mild insufficiency  . Tobacco abuse     80 pack years    has GERD (gastroesophageal reflux disease); Hyperlipidemia; Palpitations; Chronic lymphocytic leukemia ([place]); Aortic valve disorder;  Right bundle branch block; and Tobacco abuse on his problem list.     has No Known Allergies.  Alfred Brown does not currently have medications on file.  SURGICAL HISTORY: Past Surgical History  Procedure Laterality Date  . Rotator cuff repair  1997    right  . Ganglion cyst excision  1997    left wrist  . Colonoscopy  01/2012    Negative screening study  . Shoulder arthroscopy with subacromial decompression  04/09/2012    Procedure: SHOULDER ARTHROSCOPY WITH SUBACROMIAL DECOMPRESSION;  Surgeon: Ninetta Lights, MD;  Location: [facility];  Service: Orthopedics;  Laterality: Left;  LEFT SHOULDER ARTHROSCOPY, SUBACROMIAL DECOMPRESSION, PARTIAL ACROMIOPLASTY WITH CORACOACROMIAL RELEASE, DISTAL CLAVICULECTOMY WITH ROTATOR CUFF REPAIR, DEBRIDEMENT OF LABRUM    SOCIAL HISTORY: Social History   Social History  . Marital Status: Married    Spouse Name: N/A  . Number of Children: N/A  . Years of Education: N/A   Occupational History  . Not on file.   Social History Main Topics  . Smoking status: Current Every Day Smoker -- 1.00 packs/day for 45 years    Types: Cigarettes  . Smokeless tobacco: Never Used     Comment: Consumption decreased approximately 2010 to one pack per day  . Alcohol Use: No  . Drug Use: No  . Sexual Activity: Not on file   Other Topics Concern  . Not on file  Social History Narrative    FAMILY HISTORY: Family History  Problem Relation Age of Onset  . Stroke Mother   . Heart attack Father   . Cancer Sister     colon  . Colon cancer Sister   . Cancer Brother     2 brothers died with lung cancer    Review of Systems  Constitutional: Positive for malaise/fatigue. Negative for fever, chills, malaise/fatigue, and weight loss.  HENT: Negative for congestion, hearing loss, nosebleeds, sore throat and tinnitus.   Eyes: Negative for blurred vision, double vision, pain and discharge.  Respiratory: Negative for cough, hemoptysis, sputum  production, shortness of breath and wheezing.   Cardiovascular: Negative for chest pain, palpitations, claudication, leg swelling and PND.  Gastrointestinal: Negative for heartburn, nausea, vomiting, abdominal pain, diarrhea, constipation, blood in stool and melena.  Genitourinary: Negative for dysuria, urgency, frequency and hematuria.  Musculoskeletal: Positive for joint pain. Negative for myalgias and falls.  Skin: Negative for itching and rash.  Neurological: Negative for dizziness, tingling, tremors, sensory change, speech change, focal weakness, seizures, loss of consciousness, weakness and headaches.  Endo/Heme/Allergies: Does not bruise/bleed easily.  Psychiatric/Behavioral: Negative for depression, suicidal ideas, memory loss and substance abuse. The patient is not nervous/anxious and does not have insomnia.   14 point review of systems was performed and is negative except as detailed under history of present illness and above   PHYSICAL EXAMINATION  ECOG PERFORMANCE STATUS: 1 - Symptomatic but completely ambulatory  Filed Vitals:   08/21/15 1112  BP: 124/59  Pulse: 74  Temp: 98.5 F (36.9 C)  Resp: 20    Physical Exam  Constitutional: He is oriented to person, place, and time and well-developed, well-nourished, and in no distress.  HENT:  Head: Normocephalic and atraumatic.  Nose: Nose normal.  Mouth/Throat: Oropharynx is clear and moist. No oropharyngeal exudate.  Eyes: Conjunctivae and EOM are normal. Pupils are equal, round, and reactive to light. Right eye exhibits no discharge. Left eye exhibits no discharge. No scleral icterus.  Neck: Normal range of motion. Neck supple. No tracheal deviation present. No thyromegaly present.  Cardiovascular: Normal rate, regular rhythm.  Exam reveals no gallop and no friction rub.   Systolic ejection murmur heard. Heart monitor in place. Pulmonary/Chest: Effort normal and breath sounds normal. He has no wheezes. He has no rales.    Abdominal: Soft. Bowel sounds are normal. He exhibits no distension and no mass. There is no tenderness. There is no rebound and no guarding.  Musculoskeletal: Normal range of motion. He exhibits no edema.  Lymphadenopathy:       Head (right side): Submandibular adenopathy present.       Head (left side): Submandibular adenopathy present.    He has cervical adenopathy.       Right cervical: Superficial cervical adenopathy present.       Left cervical: Superficial cervical adenopathy present.  Largest right inguinal node, 3 cm  Neurological: He is alert and oriented to person, place, and time. He has normal reflexes. No cranial nerve deficit. Gait normal. Coordination normal.  Skin: Skin is warm and dry. No rash noted.  Psychiatric: Mood, memory, affect and judgment normal.  Nursing note and vitals reviewed.   LABORATORY DATA: I have reviewed the data as listed.  CBC    Component Value Date/Time   WBC 126.8* 08/21/2015 1022   RBC 4.67 08/21/2015 1022   RBC 4.82 06/06/2014 0930   HGB 14.0 08/21/2015 1022   HCT 44.7 08/21/2015 1022  PLT 161 08/21/2015 1022   MCV 95.7 08/21/2015 1022   MCH 30.0 08/21/2015 1022   MCHC 31.3 08/21/2015 1022   RDW 14.2 08/21/2015 1022   LYMPHSABS 119.2* 08/21/2015 1022   MONOABS 2.5* 08/21/2015 1022   EOSABS 0.0 08/21/2015 1022   BASOSABS 0.0 08/21/2015 1022    CMP     Component Value Date/Time   NA 138 08/21/2015 1022   K 4.2 08/21/2015 1022   CL 107 08/21/2015 1022   CO2 27 08/21/2015 1022   GLUCOSE 112* 08/21/2015 1022   BUN 18 08/21/2015 1022   CREATININE 0.83 08/21/2015 1022   CALCIUM 8.8* 08/21/2015 1022   PROT 6.4* 08/21/2015 1022   ALBUMIN 4.5 08/21/2015 1022   AST 21 08/21/2015 1022   ALT 15* 08/21/2015 1022   ALKPHOS 119 08/21/2015 1022   BILITOT 0.5 08/21/2015 1022   GFRNONAA >60 08/21/2015 1022   GFRAA >60 08/21/2015 1022     ASSESSMENT and THERAPY PLAN:  CLL, Stage I with LAD, elevated  WBC Hypogammaglobulinemia  Basal cell and Squamous cell carcinoma of skin Thrombocytopenia   65 year old male with CLL. Blood counts remain fairly stable.  We spent time today again addressing indications to treat CLL.   We discussed his recent elevated WBC count and the implication of these counts. WBC count alone is not an indication to treat unless there is a rapid doubling time.  For now we will continue with observation.   We addressed his fatigue and I again reminded him that fatigue is subjective however if he feels that his CLL is a significant cause, treatment is not unreasonable. I cannot guarantee him that treatment will alleviate his fatigue however.   He will return for routine follow-up in 6 months with labs. I again reminded him to call with any change in baseline energy, appetite or other concerns.   All questions were answered. The patient knows to call the clinic with any problems, questions or concerns. We can certainly see the patient much sooner if necessary.   This document serves as a record of services personally performed by Ancil Linsey, MD. It was created on her behalf by Arlyce Harman, a trained medical scribe. The creation of this record is based on the scribe's personal observations and the provider's statements to them. This document has been checked and approved by the attending provider.  I have reviewed the above documentation for accuracy and completeness, and I agree with the above.  This note was electronically signed.  Kelby Fam. Whitney Muse, MD

## 2015-08-22 ENCOUNTER — Encounter (HOSPITAL_COMMUNITY): Payer: Self-pay | Admitting: Hematology & Oncology

## 2015-09-12 ENCOUNTER — Telehealth: Payer: Self-pay

## 2015-09-14 ENCOUNTER — Encounter: Payer: Self-pay | Admitting: Adult Health

## 2015-09-14 ENCOUNTER — Ambulatory Visit (INDEPENDENT_AMBULATORY_CARE_PROVIDER_SITE_OTHER): Payer: Medicare Other | Admitting: Adult Health

## 2015-09-14 VITALS — BP 134/78 | HR 88 | Ht 72.0 in | Wt 175.0 lb

## 2015-09-14 DIAGNOSIS — Q231 Congenital insufficiency of aortic valve: Secondary | ICD-10-CM | POA: Diagnosis not present

## 2015-09-14 DIAGNOSIS — R002 Palpitations: Secondary | ICD-10-CM

## 2015-09-14 DIAGNOSIS — Q2381 Bicuspid aortic valve: Secondary | ICD-10-CM

## 2015-09-14 NOTE — Progress Notes (Signed)
Cardiology Office Note   Date:  09/14/2015   ID:  Alfred Brown, DOB 03-Jan-1951, MRN [id number]  PCP:  Alfred Brown., MD  Cardiologist:Branch/  Alfred Sims, NP   No chief complaint on file.     History of Present Illness: Alfred Brown is a 65 y.o. male who presents for ongoing assessment and management palpitations, with a history of a bicuspid aortic valve. CT of the chest in October 2006 revealed no significant aortopathy. He was seen last by Dr. Harl Brown to place a 21 day monitor on he was counseled to cut back on caffeine, there was no significant valvular dysfunction from previous testing.  Here today feeling very well without any further complaints of discomfort or palpitations. He continues to be active, walking exercising working outside in his yard and has had no complaints of dyspnea or or syncope. He is medically compliant.  Past Medical History  Diagnosis Date  . CLL (chronic lymphocytic leukemia) ([facility])   . COPD (chronic obstructive pulmonary disease) ([facility])   . CMV (cytomegalovirus) ([facility])   . Depression with anxiety   . DJD (degenerative joint disease)   . Lipoma     left chest wall  . Right bundle branch block     + left posterior fascicular block  . GERD (gastroesophageal reflux disease)   . Hyperlipidemia   . Palpitations 2004    PVCs; borderline stress nuclear in 2004, negative in 2007; Echo 2007; AV-sclerotic, very mild AI  . Aortic valve disorder     Mild insufficiency  . Tobacco abuse     80 pack years    Past Surgical History  Procedure Laterality Date  . Rotator cuff repair  1997    right  . Ganglion cyst excision  1997    left wrist  . Colonoscopy  01/2012    Negative screening study  . Shoulder arthroscopy with subacromial decompression  04/09/2012    Procedure: SHOULDER ARTHROSCOPY WITH SUBACROMIAL DECOMPRESSION;  Surgeon: Ninetta Lights, MD;  Location: [facility];  Service: Orthopedics;  Laterality: Left;  LEFT SHOULDER  ARTHROSCOPY, SUBACROMIAL DECOMPRESSION, PARTIAL ACROMIOPLASTY WITH CORACOACROMIAL RELEASE, DISTAL CLAVICULECTOMY WITH ROTATOR CUFF REPAIR, DEBRIDEMENT OF LABRUM     Current Outpatient Prescriptions  Medication Sig Dispense Refill  . acyclovir (ZOVIRAX) 800 MG tablet Take 800 mg by mouth 5 (five) times daily as needed (outbreak).     [name] albuterol (PROVENTIL HFA;VENTOLIN HFA) 108 (90 BASE) MCG/ACT inhaler Inhale 2 puffs into the lungs every 6 (six) hours as needed for wheezing or shortness of breath.     . ALPRAZolam (XANAX) 1 MG tablet Take 1 mg by mouth 3 (three) times daily as needed for anxiety.     [name] aspirin 325 MG tablet Take 325 mg by mouth daily.      [name] buPROPion (WELLBUTRIN SR) 150 MG 12 hr tablet Take 150 mg by mouth daily.     . calcium carbonate (OS-CAL) 600 MG TABS Take 600 mg by mouth daily.      . clotrimazole-betamethasone (LOTRISONE) cream Apply 1 application topically daily as needed. Affected area    . Diphenhydramine-PE-APAP 12.5-5-325 MG TABS Take 1 tablet by mouth as needed (sinus). sinus    . fish oil-omega-3 fatty acids 1000 MG capsule Take 2 g by mouth daily.      . fluticasone (FLONASE) 50 MCG/ACT nasal spray Place 2 sprays into the nose daily.      [name] HYDROcodone-acetaminophen (NORCO/VICODIN) 5-325 MG per tablet Take 1 tablet  by mouth every 6 (six) hours as needed for moderate pain.     . hydrOXYzine (ATARAX/VISTARIL) 25 MG tablet Take 25 mg by mouth every 8 (eight) hours as needed for anxiety or itching.     . Loratadine 10 MG CAPS Take 10 mg by mouth daily.     [name] lovastatin (MEVACOR) 20 MG tablet Take 20 mg by mouth daily.    . Multiple Vitamin (MULTIVITAMIN) tablet Take 1 tablet by mouth daily.      . naproxen (NAPROSYN) 500 MG tablet Take 500 mg by mouth 2 (two) times daily as needed for mild pain or moderate pain.     . niacin (NIASPAN) 500 MG CR tablet Take 500 mg by mouth daily.     No current facility-administered medications for this visit.    Allergies:    Review of patient's allergies indicates no known allergies.    Social History:  The patient  reports that he has been smoking Cigarettes.  He has a 45 pack-year smoking history. He has never used smokeless tobacco. He reports that he does not drink alcohol or use illicit drugs.   Family History:  The patient's family history includes Cancer in his brother and sister; Colon cancer in his sister; Heart attack in his father; Stroke in his mother.    ROS: All other systems are reviewed and negative. Unless otherwise mentioned in H&P    PHYSICAL EXAM: VS:  BP 134/78 mmHg  Pulse 88  Ht 6' (1.829 m)  Wt 175 lb (79.379 kg)  BMI 23.73 kg/m2  SpO2 95% , BMI Body mass index is 23.73 kg/(m^2). GEN: Well nourished, well developed, in no acute distress HEENT: normal Neck: no JVD, carotid bruits, or masses Cardiac: RRR; 1/6 systolic murmur heard best at the right sternal borderno murmurs, rubs, or gallops,no edema  Respiratory:  clear to auscultation bilaterally, normal work of breathing GI: soft, nontender, nondistended, + BS MS: no deformity or atrophy Skin: warm and dry, no rash Neuro:  Strength and sensation are intact Psych: euthymic mood, full affect  Recent Labs: 07/05/2015: B Natriuretic Peptide 14.0; Magnesium 1.9 08/21/2015: ALT 15*; BUN 18; Creatinine, Ser 0.83; Hemoglobin 14.0; Platelets 161; Potassium 4.2; Sodium 138    Lipid Panel No results found for: CHOL, TRIG, HDL, CHOLHDL, VLDL, LDLCALC, LDLDIRECT    Wt Readings from Last 3 Encounters:  09/14/15 175 lb (79.379 kg)  08/21/15 178 lb (80.74 kg)  08/14/15 176 lb (79.833 kg)     ASSESSMENT AND PLAN:  1. Bicuspid aortic valve: most recent echocardiogram did not reveal any significant valvular dysfunction. The patient is asymptomatic. We'll not make any changes in his medication at this time.  2. Palpitations: No present complaints. No medication changes at this time.   Current medicines are reviewed at length with the  patient today.    Labs/ tests ordered today include:  No orders of the defined types were placed in this encounter.     Disposition:   FU with one year unless he becomes symptomatic. This has been explained to the patient we're happy to see him sooner if needed.  Signed, Alfred Sims, NP  09/14/2015 1:31 PM    [facility] [address], [place], [place] [address] Phone: [phone]; Fax: [phone]

## 2015-09-14 NOTE — Patient Instructions (Signed)
Your physician wants you to follow-up in: 1 year with Dr. Harl Bowie.  You will receive a reminder letter in the mail two months in advance. If you don't receive a letter, please call our office to schedule the follow-up appointment.  Your physician recommends that you continue on your current medications as directed. Please refer to the Current Medication list given to you today.  If you need a refill on your cardiac medications before your next appointment, please call your pharmacy.  Thank you for choosing [facility]!

## 2015-09-14 NOTE — Progress Notes (Deleted)
Name: Alfred Brown [facility]    DOB: 03-23-1951  Age: 65 y.o.  MR#: [id number]       PCP:  [name]., MD      Insurance: Payor: MEDICARE / Plan: MEDICARE PART A AND B / Product Type: *No Product type* /   CC:   No chief complaint on file.   VS Filed Vitals:   09/14/15 1315  BP: 134/78  Pulse: 88  Height: 6' (1.829 m)  Weight: 175 lb (79.379 kg)  SpO2: 95%    Weights Current Weight  09/14/15 175 lb (79.379 kg)  08/21/15 178 lb (80.74 kg)  08/14/15 176 lb (79.833 kg)    Blood Pressure  BP Readings from Last 3 Encounters:  09/14/15 134/78  08/21/15 124/59  08/14/15 128/64     Admit date:  (Not on file) Last encounter with RMR:  Visit date not found   Allergy Review of patient's allergies indicates no known allergies.  Current Outpatient Prescriptions  Medication Sig Dispense Refill  . acyclovir (ZOVIRAX) 800 MG tablet Take 800 mg by mouth 5 (five) times daily as needed (outbreak).     [name] albuterol (PROVENTIL HFA;VENTOLIN HFA) 108 (90 BASE) MCG/ACT inhaler Inhale 2 puffs into the lungs every 6 (six) hours as needed for wheezing or shortness of breath.     . ALPRAZolam (XANAX) 1 MG tablet Take 1 mg by mouth 3 (three) times daily as needed for anxiety.     [name] aspirin 325 MG tablet Take 325 mg by mouth daily.      [name] buPROPion (WELLBUTRIN SR) 150 MG 12 hr tablet Take 150 mg by mouth daily.     . calcium carbonate (OS-CAL) 600 MG TABS Take 600 mg by mouth daily.      . clotrimazole-betamethasone (LOTRISONE) cream Apply 1 application topically daily as needed. Affected area    . Diphenhydramine-PE-APAP 12.5-5-325 MG TABS Take 1 tablet by mouth as needed (sinus). sinus    . fish oil-omega-3 fatty acids 1000 MG capsule Take 2 g by mouth daily.      . fluticasone (FLONASE) 50 MCG/ACT nasal spray Place 2 sprays into the nose daily.      [name] HYDROcodone-acetaminophen (NORCO/VICODIN) 5-325 MG per tablet Take 1 tablet by mouth every 6 (six) hours as needed for moderate pain.     . hydrOXYzine  (ATARAX/VISTARIL) 25 MG tablet Take 25 mg by mouth every 8 (eight) hours as needed for anxiety or itching.     . Loratadine 10 MG CAPS Take 10 mg by mouth daily.     [name] lovastatin (MEVACOR) 20 MG tablet Take 20 mg by mouth daily.    . Multiple Vitamin (MULTIVITAMIN) tablet Take 1 tablet by mouth daily.      . naproxen (NAPROSYN) 500 MG tablet Take 500 mg by mouth 2 (two) times daily as needed for mild pain or moderate pain.     . niacin (NIASPAN) 500 MG CR tablet Take 500 mg by mouth daily.     No current facility-administered medications for this visit.    Discontinued Meds:   There are no discontinued medications.  Patient Active Problem List   Diagnosis Date Noted  . GERD (gastroesophageal reflux disease)   . Hyperlipidemia   . Palpitations   . Chronic lymphocytic leukemia ([place])   . Aortic valve disorder   . Right bundle branch block   . Tobacco abuse     LABS    Component Value Date/Time   NA 138 08/21/2015 1022  NA 142 07/05/2015 1753   NA 140 02/20/2015 1111   K 4.2 08/21/2015 1022   K 4.4 07/05/2015 1753   K 4.6 02/20/2015 1111   CL 107 08/21/2015 1022   CL 108 07/05/2015 1753   CL 108 02/20/2015 1111   CO2 27 08/21/2015 1022   CO2 26 07/05/2015 1753   CO2 27 02/20/2015 1111   GLUCOSE 112* 08/21/2015 1022   GLUCOSE 102* 07/05/2015 1753   GLUCOSE 101* 02/20/2015 1111   BUN 18 08/21/2015 1022   BUN 18 07/05/2015 1753   BUN 21* 02/20/2015 1111   CREATININE 0.83 08/21/2015 1022   CREATININE 0.92 07/05/2015 1753   CREATININE 0.90 02/20/2015 1111   CALCIUM 8.8* 08/21/2015 1022   CALCIUM 9.2 07/05/2015 1753   CALCIUM 9.1 02/20/2015 1111   GFRNONAA >60 08/21/2015 1022   GFRNONAA >60 07/05/2015 1753   GFRNONAA >60 02/20/2015 1111   GFRAA >60 08/21/2015 1022   GFRAA >60 07/05/2015 1753   GFRAA >60 02/20/2015 1111   CMP     Component Value Date/Time   NA 138 08/21/2015 1022   K 4.2 08/21/2015 1022   CL 107 08/21/2015 1022   CO2 27 08/21/2015 1022   GLUCOSE  112* 08/21/2015 1022   BUN 18 08/21/2015 1022   CREATININE 0.83 08/21/2015 1022   CALCIUM 8.8* 08/21/2015 1022   PROT 6.4* 08/21/2015 1022   ALBUMIN 4.5 08/21/2015 1022   AST 21 08/21/2015 1022   ALT 15* 08/21/2015 1022   ALKPHOS 119 08/21/2015 1022   BILITOT 0.5 08/21/2015 1022   GFRNONAA >60 08/21/2015 1022   GFRAA >60 08/21/2015 1022       Component Value Date/Time   WBC 126.8* 08/21/2015 1022   WBC 145.6* 07/05/2015 1753   WBC 122.6* 02/20/2015 1111   HGB 14.0 08/21/2015 1022   HGB 15.0 07/05/2015 1753   HGB 14.9 02/20/2015 1111   HCT 44.7 08/21/2015 1022   HCT 46.8 07/05/2015 1753   HCT 45.1 02/20/2015 1111   MCV 95.7 08/21/2015 1022   MCV 94.7 07/05/2015 1753   MCV 94.0 02/20/2015 1111    Lipid Panel  No results found for: CHOL, TRIG, HDL, CHOLHDL, VLDL, LDLCALC, LDLDIRECT  ABG No results found for: PHART, PCO2ART, PO2ART, HCO3, TCO2, ACIDBASEDEF, O2SAT   No results found for: TSH BNP (last 3 results)  Recent Labs  07/05/15 1753  BNP 14.0    ProBNP (last 3 results) No results for input(s): PROBNP in the last 8760 hours.  Cardiac Panel (last 3 results) No results for input(s): CKTOTAL, CKMB, TROPONINI, RELINDX in the last 72 hours.  Iron/TIBC/Ferritin/ %Sat No results found for: IRON, TIBC, FERRITIN, IRONPCTSAT   EKG Orders placed or performed during the hospital encounter of 07/05/15  . EKG 12-Lead  . EKG 12-Lead  . EKG 12-Lead  . EKG 12-Lead  . ED EKG  . ED EKG  . EKG 12-Lead  . EKG 12-Lead  . EKG     Prior Assessment and Plan Problem List as of 09/14/2015      Cardiovascular and Mediastinum   Aortic valve disorder   Last Assessment & Plan 03/27/2012 Office Visit Written 03/27/2012  3:50 PM by Yehuda Savannah, MD    Aortic valve initially characterized as bicuspid in 2004; subsequent echocardiogram in 2008 did not verify that impression, but he does appear to have a congenitally abnormal valve with mild insufficiency and no stenosis.  In  the absence of more compelling disease at age 89, I doubt  that this will ever represent a significant problem for him.  It has no impact on the planned orthopaedic surgery.      Right bundle branch block   Last Assessment & Plan 03/27/2012 Office Visit Edited 03/30/2012  7:37 AM by Yehuda Savannah, MD    Presence of conduction system abnormalities indicate a slightly higher likelihood of developing significant heart block and a slightly higher likelihood of underlying cardiac disease including coronary artery disease.  These risks are extremely low, and no preoperative testing is required.        Digestive   GERD (gastroesophageal reflux disease)     Other   Hyperlipidemia   Last Assessment & Plan 03/27/2012 Office Visit Written 03/27/2012  3:53 PM by Yehuda Savannah, MD    No measurements of serum lipids available for my review.  If not previously obtained, a lipid profile should be performed.  Patient has no known vascular disease and would only require pharmacologic therapy for moderate-to-marked elevation of total and LDL cholesterol.      Palpitations   Last Assessment & Plan 03/27/2012 Office Visit Written 03/27/2012  3:54 PM by Yehuda Savannah, MD    Patient has been asymptomatic from a cardiac standpoint with a negative stress nuclear study in 2007 and a normal echocardiogram except for mild aortic valve disease at the same time.  He did not require additional cardiac testing or imaging prior to the planned arthroscopic or open surgical intervention in the left shoulder.      Chronic lymphocytic leukemia [facility])   Last Assessment & Plan 03/27/2012 Office Visit Edited 03/31/2012  9:54 AM by Yehuda Savannah, MD    CLL is managed by Dr. Tressie Stalker, but has generally not required any treatment.  On recent CBC, white count was 90,000.  I will seek his opinion regarding any precautions to take in conjunction with surgery.  03/31/12: Verified with Dr. Tressie Stalker that no precautions or  modification of medical regime are required prior to orthopaedic surgery.      Tobacco abuse       Imaging: No results found.

## 2015-10-08 DIAGNOSIS — N400 Enlarged prostate without lower urinary tract symptoms: Secondary | ICD-10-CM | POA: Diagnosis not present

## 2015-10-08 DIAGNOSIS — R300 Dysuria: Secondary | ICD-10-CM | POA: Diagnosis not present

## 2015-10-08 DIAGNOSIS — N419 Inflammatory disease of prostate, unspecified: Secondary | ICD-10-CM | POA: Diagnosis not present

## 2015-10-11 DIAGNOSIS — N410 Acute prostatitis: Secondary | ICD-10-CM | POA: Diagnosis not present

## 2015-10-11 DIAGNOSIS — Z1389 Encounter for screening for other disorder: Secondary | ICD-10-CM | POA: Diagnosis not present

## 2015-10-11 DIAGNOSIS — Z6822 Body mass index (BMI) 22.0-22.9, adult: Secondary | ICD-10-CM | POA: Diagnosis not present

## 2015-10-19 DIAGNOSIS — K219 Gastro-esophageal reflux disease without esophagitis: Secondary | ICD-10-CM | POA: Diagnosis not present

## 2015-10-19 DIAGNOSIS — J449 Chronic obstructive pulmonary disease, unspecified: Secondary | ICD-10-CM | POA: Diagnosis not present

## 2015-10-19 DIAGNOSIS — Z6822 Body mass index (BMI) 22.0-22.9, adult: Secondary | ICD-10-CM | POA: Diagnosis not present

## 2015-10-19 DIAGNOSIS — N410 Acute prostatitis: Secondary | ICD-10-CM | POA: Diagnosis not present

## 2015-11-07 DIAGNOSIS — Z6822 Body mass index (BMI) 22.0-22.9, adult: Secondary | ICD-10-CM | POA: Diagnosis not present

## 2015-11-07 DIAGNOSIS — Z0000 Encounter for general adult medical examination without abnormal findings: Secondary | ICD-10-CM | POA: Diagnosis not present

## 2015-11-07 DIAGNOSIS — Z1389 Encounter for screening for other disorder: Secondary | ICD-10-CM | POA: Diagnosis not present

## 2015-12-07 ENCOUNTER — Other Ambulatory Visit (HOSPITAL_COMMUNITY): Payer: Self-pay | Admitting: Registered Nurse

## 2015-12-07 ENCOUNTER — Ambulatory Visit (HOSPITAL_COMMUNITY)
Admission: RE | Admit: 2015-12-07 | Discharge: 2015-12-07 | Disposition: A | Discharge status: Home / Self Care | Payer: Medicare Other | Source: Ambulatory Visit | Attending: Registered Nurse | Admitting: Registered Nurse

## 2015-12-07 DIAGNOSIS — R52 Pain, unspecified: Secondary | ICD-10-CM | POA: Diagnosis not present

## 2015-12-07 DIAGNOSIS — I700 Atherosclerosis of aorta: Secondary | ICD-10-CM | POA: Insufficient documentation

## 2015-12-07 DIAGNOSIS — N200 Calculus of kidney: Secondary | ICD-10-CM | POA: Insufficient documentation

## 2015-12-07 DIAGNOSIS — R590 Localized enlarged lymph nodes: Secondary | ICD-10-CM | POA: Diagnosis not present

## 2015-12-07 DIAGNOSIS — R319 Hematuria, unspecified: Secondary | ICD-10-CM

## 2015-12-07 DIAGNOSIS — K5730 Diverticulosis of large intestine without perforation or abscess without bleeding: Secondary | ICD-10-CM | POA: Insufficient documentation

## 2015-12-07 DIAGNOSIS — M549 Dorsalgia, unspecified: Secondary | ICD-10-CM | POA: Diagnosis not present

## 2015-12-07 DIAGNOSIS — Z1389 Encounter for screening for other disorder: Secondary | ICD-10-CM | POA: Diagnosis not present

## 2015-12-07 DIAGNOSIS — Z6823 Body mass index (BMI) 23.0-23.9, adult: Secondary | ICD-10-CM | POA: Diagnosis not present

## 2015-12-07 DIAGNOSIS — J449 Chronic obstructive pulmonary disease, unspecified: Secondary | ICD-10-CM | POA: Diagnosis not present

## 2015-12-07 DIAGNOSIS — M19031 Primary osteoarthritis, right wrist: Secondary | ICD-10-CM | POA: Diagnosis not present

## 2015-12-18 DIAGNOSIS — Z6823 Body mass index (BMI) 23.0-23.9, adult: Secondary | ICD-10-CM | POA: Diagnosis not present

## 2015-12-18 DIAGNOSIS — J449 Chronic obstructive pulmonary disease, unspecified: Secondary | ICD-10-CM | POA: Diagnosis not present

## 2015-12-18 DIAGNOSIS — M47816 Spondylosis without myelopathy or radiculopathy, lumbar region: Secondary | ICD-10-CM | POA: Diagnosis not present

## 2015-12-18 DIAGNOSIS — C9190 Lymphoid leukemia, unspecified not having achieved remission: Secondary | ICD-10-CM | POA: Diagnosis not present

## 2015-12-18 DIAGNOSIS — M549 Dorsalgia, unspecified: Secondary | ICD-10-CM | POA: Diagnosis not present

## 2015-12-18 DIAGNOSIS — M545 Low back pain: Secondary | ICD-10-CM | POA: Diagnosis not present

## 2015-12-18 DIAGNOSIS — R3129 Other microscopic hematuria: Secondary | ICD-10-CM | POA: Diagnosis not present

## 2016-01-12 ENCOUNTER — Encounter (HOSPITAL_COMMUNITY): Payer: Medicare Other | Attending: Oncology | Admitting: Oncology

## 2016-01-12 ENCOUNTER — Other Ambulatory Visit (HOSPITAL_COMMUNITY): Payer: Medicare Other

## 2016-01-12 ENCOUNTER — Encounter (HOSPITAL_COMMUNITY): Payer: Self-pay | Admitting: Oncology

## 2016-01-12 VITALS — BP 131/61 | HR 85 | Temp 98.2°F | Resp 18 | Wt 178.6 lb

## 2016-01-12 DIAGNOSIS — Z0000 Encounter for general adult medical examination without abnormal findings: Secondary | ICD-10-CM

## 2016-01-12 DIAGNOSIS — J00 Acute nasopharyngitis [common cold]: Secondary | ICD-10-CM

## 2016-01-12 DIAGNOSIS — C9110 Chronic lymphocytic leukemia of B-cell type not having achieved remission: Secondary | ICD-10-CM

## 2016-01-12 DIAGNOSIS — Z23 Encounter for immunization: Secondary | ICD-10-CM | POA: Diagnosis not present

## 2016-01-12 DIAGNOSIS — D801 Nonfamilial hypogammaglobulinemia: Secondary | ICD-10-CM

## 2016-01-12 LAB — CBC WITH DIFFERENTIAL/PLATELET
BASOS ABS: 1.4 10*3/uL — AB (ref 0.0–0.1)
Basophils Relative: 1 %
EOS ABS: 0 10*3/uL (ref 0.0–0.7)
Eosinophils Relative: 0 %
HCT: 45.8 % (ref 39.0–52.0)
HEMOGLOBIN: 15 g/dL (ref 13.0–17.0)
LYMPHS ABS: 130 10*3/uL — AB (ref 0.7–4.0)
LYMPHS PCT: 91 %
MCH: 31.1 pg (ref 26.0–34.0)
MCHC: 32.8 g/dL (ref 30.0–36.0)
MCV: 94.8 fL (ref 78.0–100.0)
MONO ABS: 2.9 10*3/uL — AB (ref 0.1–1.0)
Monocytes Relative: 2 %
NEUTROS ABS: 8.6 10*3/uL — AB (ref 1.7–7.7)
Neutrophils Relative %: 6 %
PLATELETS: 148 10*3/uL — AB (ref 150–400)
RBC: 4.83 MIL/uL (ref 4.22–5.81)
RDW: 14.4 % (ref 11.5–15.5)
WBC: 142.9 10*3/uL (ref 4.0–10.5)

## 2016-01-12 LAB — COMPREHENSIVE METABOLIC PANEL
ALK PHOS: 119 U/L (ref 38–126)
ALT: 17 U/L (ref 17–63)
AST: 20 U/L (ref 15–41)
Albumin: 4.7 g/dL (ref 3.5–5.0)
Anion gap: 4 — ABNORMAL LOW (ref 5–15)
BILIRUBIN TOTAL: 0.6 mg/dL (ref 0.3–1.2)
BUN: 22 mg/dL — AB (ref 6–20)
CALCIUM: 9.1 mg/dL (ref 8.9–10.3)
CHLORIDE: 110 mmol/L (ref 101–111)
CO2: 26 mmol/L (ref 22–32)
CREATININE: 0.98 mg/dL (ref 0.61–1.24)
GFR calc Af Amer: >60 mL/min (ref >60)
Glucose, Bld: 95 mg/dL (ref 65–99)
Potassium: 4.2 mmol/L (ref 3.5–5.1)
SODIUM: 140 mmol/L (ref 135–145)
TOTAL PROTEIN: 6.6 g/dL (ref 6.5–8.1)

## 2016-01-12 LAB — LACTATE DEHYDROGENASE: LDH: 155 U/L (ref 98–192)

## 2016-01-12 MED ORDER — AZITHROMYCIN 250 MG PO TABS: ORAL_TABLET | ORAL | 0 refills | Status: DC

## 2016-01-12 MED ORDER — INFLUENZA VAC SPLIT QUAD 0.5 ML IM SUSY
0.5000 mL | PREFILLED_SYRINGE | Freq: Once | INTRAMUSCULAR | Status: AC
  Administered 2016-01-12: 0.5 mL via INTRAMUSCULAR
  Filled 2016-01-12: qty 0.5

## 2016-01-12 NOTE — Progress Notes (Signed)
CRITICAL VALUE ALERT Critical value received:  WBC-142.9 Date of notification:  01/12/16 Time of notification: 8453 Critical value read back:  Yes.   Nurse who received alert:  M.Savahna Casados, LPN MD notified (1st page):  Kirby Crigler, PA-C

## 2016-01-12 NOTE — Progress Notes (Signed)
Alfred Brown., MD [place] [id number]  Chronic lymphocytic leukemia [facility]) - Plan: CBC with Differential, Comprehensive metabolic panel, Lactate dehydrogenase, IgG, IgA, IgM  Acute nasopharyngitis - Plan: azithromycin (ZITHROMAX) 250 MG tablet  Preventative health care - Plan: Influenza vac split quadrivalent PF (FLUARIX) injection 0.5 mL  CURRENT THERAPY: Observation  INTERVAL HISTORY: Alfred Brown 65 y.o. male returns for followup of CLL (Rai Stage I with enlarged inguinal nodes, no hepatosplenomegaly, normal Hgb and platelet count) presenting in November 2004, not requiring therapeutic intervention.  AND  Hypogammaglobulinemia.  Alfred Brown is here sooner than planned based upon recent imaging.    He denies any B symptoms.  His weight is stable.  He reports recent bought of prostatitis.  He notes nasal discharge that is clear and he denies any fever.  He wants an antibiotic.  He report back pain that is exacerbated by movements.  He describes the pain as sharp and intense.  He reports that it comes and goes and does not last long.  He was worked up for renal calculus and this was negative.  He was given short courses of antiinflammatories by his primary care provider that were effective for the pain, but it did not resolve.  Review of Systems  Constitutional: Positive for malaise/fatigue (chronic and at baseline). Negative for chills, fever and weight loss.  HENT: Positive for congestion. Negative for sore throat.   Eyes: Negative.   Respiratory: Negative.  Negative for cough.   Cardiovascular: Negative.  Negative for chest pain.  Gastrointestinal: Negative.   Genitourinary: Negative.   Musculoskeletal: Positive for back pain.  Skin: Negative.   Neurological: Negative.  Negative for weakness.  Psychiatric/Behavioral: The patient is nervous/anxious.     Past Medical History:  Diagnosis Date  . Aortic valve disorder    Mild insufficiency   . Chronic lymphocytic leukemia ([place])    01/2012: WBC-90.2, H&H-15.1/45.3, platelets-185 03/31/12: Verified with Dr. Tressie Stalker that no precautions or modification of medical regime are required prior to orthopaedic surgery.   . CLL (chronic lymphocytic leukemia) ([place])   . CMV (cytomegalovirus) ([place])   . COPD (chronic obstructive pulmonary disease) ([place])   . Depression with anxiety   . DJD (degenerative joint disease)   . GERD (gastroesophageal reflux disease)   . Hyperlipidemia   . Lipoma    left chest wall  . Palpitations 2004   PVCs; borderline stress nuclear in 2004, negative in 2007; Echo 2007; AV-sclerotic, very mild AI  . Right bundle branch block    + left posterior fascicular block  . Tobacco abuse    80 pack years    Past Surgical History:  Procedure Laterality Date  . COLONOSCOPY  01/2012   Negative screening study  . GANGLION CYST EXCISION  1997   left wrist  . ROTATOR CUFF REPAIR  1997   right  . SHOULDER ARTHROSCOPY WITH SUBACROMIAL DECOMPRESSION  04/09/2012   Procedure: SHOULDER ARTHROSCOPY WITH SUBACROMIAL DECOMPRESSION;  Surgeon: Ninetta Lights, MD;  Location: [facility];  Service: Orthopedics;  Laterality: Left;  LEFT SHOULDER ARTHROSCOPY, SUBACROMIAL DECOMPRESSION, PARTIAL ACROMIOPLASTY WITH CORACOACROMIAL RELEASE, DISTAL CLAVICULECTOMY WITH ROTATOR CUFF REPAIR, DEBRIDEMENT OF LABRUM    Family History  Problem Relation Age of Onset  . Stroke Mother   . Heart attack Father   . Cancer Sister     colon  . Colon cancer Sister   . Cancer Brother     2 brothers died with  lung cancer    Social History   Social History  . Marital status: Married    Spouse name: N/A  . Number of children: N/A  . Years of education: N/A   Social History Main Topics  . Smoking status: Current Every Day Smoker    Packs/day: 1.00    Years: 45.00    Types: Cigarettes  . Smokeless tobacco: Never Used     Comment: Consumption decreased approximately 2010 to one  pack per day  . Alcohol use No  . Drug use: No  . Sexual activity: Not Asked   Other Topics Concern  . None   Social History Narrative  . None     PHYSICAL EXAMINATION  ECOG PERFORMANCE STATUS: 1 - Symptomatic but completely ambulatory  Vitals:   01/12/16 1117  BP: 131/61  Pulse: 85  Resp: 18  Temp: 98.2 F (36.8 C)    GENERAL:alert, no distress, well nourished, well developed, comfortable, cooperative, smiling and accompanied by his wife, nervous/anxious. SKIN: skin color, texture, turgor are normal, no rashes or significant lesions HEAD: Normocephalic, No masses, lesions, tenderness or abnormalities EYES: normal, EOMI, Conjunctiva are pink and non-injected EARS: External ears normal OROPHARYNX:lips, buccal mucosa, and tongue normal and mucous membranes are moist  NECK: supple, thyroid normal size, non-tender, without nodularity, trachea midline LYMPH:  enlarged lymph nodes palpated in right and left inguinal space. BREAST:not examined LUNGS: clear to auscultation and percussion HEART: regular rate & rhythm, no murmurs, no gallops, S1 normal and S2 normal ABDOMEN:abdomen soft, normal bowel sounds and no hepatosplenomegaly BACK: Back symmetric, no curvature., Mild to moderate spasm of paralumbar muscles EXTREMITIES:less then 2 second capillary refill, no joint deformities, effusion, or inflammation, no skin discoloration, no cyanosis  NEURO: alert & oriented x 3 with fluent speech, no focal motor/sensory deficits, gait normal   LABORATORY DATA: CBC    Component Value Date/Time   WBC 126.8 (HH) 08/21/2015 1022   RBC 4.67 08/21/2015 1022   HGB 14.0 08/21/2015 1022   HCT 44.7 08/21/2015 1022   PLT 161 08/21/2015 1022   MCV 95.7 08/21/2015 1022   MCH 30.0 08/21/2015 1022   MCHC 31.3 08/21/2015 1022   RDW 14.2 08/21/2015 1022   LYMPHSABS 119.2 (H) 08/21/2015 1022   MONOABS 2.5 (H) 08/21/2015 1022   EOSABS 0.0 08/21/2015 1022   BASOSABS 0.0 08/21/2015 1022       Chemistry      Component Value Date/Time   NA 138 08/21/2015 1022   K 4.2 08/21/2015 1022   CL 107 08/21/2015 1022   CO2 27 08/21/2015 1022   BUN 18 08/21/2015 1022   CREATININE 0.83 08/21/2015 1022      Component Value Date/Time   CALCIUM 8.8 (L) 08/21/2015 1022   ALKPHOS 119 08/21/2015 1022   AST 21 08/21/2015 1022   ALT 15 (L) 08/21/2015 1022   BILITOT 0.5 08/21/2015 1022        PENDING LABS:   RADIOGRAPHIC STUDIES:  No results found.   PATHOLOGY:    ASSESSMENT AND PLAN:  Chronic lymphocytic leukemia ([place]) CLL (Rai Stage I with enlarged inguinal nodes, no hepatosplenomegaly, normal Hgb and platelet count) presenting in November 2004, not requiring therapeutic intervention.  AND  Hypogammaglobulinemia.  Labs today: CBC diff, CMET, LDH, IgG, IgA, IgM.  I personally reviewed and went over laboratory results with the patient.  The results are noted within this dictation.  Labs in 6 months: CBC diff, CMET, LDH, IgG, IgM, IgA.  I personally reviewed  and went over radiographic studies with the patient.  The results are noted within this dictation.  No splenomegaly noted on CT imaging.  Mild enlargement in inguinal and abdominal lymphadenopathy compared to 5 years ago.  Chronic fatigue is at baseline.  "Is there anything you can give me for this?"   Nasal congestion and discharge.  He is on antihistamine medication.  Rx for Z-PAK provided today.  Influenza vaccine today.  Return in 6 months for follow-up.  He does not meet criteria for treatment for CLL at this time.  He knows to call us with questions or concerns regarding his CLL.  He is educated on signs and symptoms to report including fevers, chills, night sweats, unintentional weight loss, SEVERE fatigue, enlarging lymph nodes, abnormal bleeding/bruising.   ORDERS PLACED FOR THIS ENCOUNTER: Orders Placed This Encounter  Procedures  . CBC with Differential  . Comprehensive metabolic panel  . Lactate  dehydrogenase  . IgG, IgA, IgM    MEDICATIONS PRESCRIBED THIS ENCOUNTER: Meds ordered this encounter  Medications  . azithromycin (ZITHROMAX) 250 MG tablet    Sig: As directed    Dispense:  6 each    Refill:  0    Order Specific Question:   Supervising Provider    Answer:   Patrici Ranks [id number]  . Influenza vac split quadrivalent PF (FLUARIX) injection 0.5 mL    THERAPY PLAN:  NCCN guidelines for indication for treatment of CLL are:  A. Eligible for clinical trial  B. Significant disease-related symptoms   1. Fatigue (severe)   2. Night sweats   3. Weight loss   4. Fever without infection  C. Threatened end-organ function  D. Progressive bulky disease (spleen >6cm below costal margin, lymph nodes >10 cm)  E. Progressive anemia  F. Progressive thrombocytopenia.  All questions were answered. The patient knows to call the clinic with any problems, questions or concerns. We can certainly see the patient much sooner if necessary.  Patient and plan discussed with Dr. Ancil Linsey and she is in agreement with the aforementioned.   This note is electronically signed by: Doy Mince 01/12/2016 12:25 PM

## 2016-01-12 NOTE — Assessment & Plan Note (Addendum)
CLL (Rai Stage I with enlarged inguinal nodes, no hepatosplenomegaly, normal Hgb and platelet count) presenting in November 2004, not requiring therapeutic intervention.  AND  Hypogammaglobulinemia.  Labs today: CBC diff, CMET, LDH, IgG, IgA, IgM.  I personally reviewed and went over laboratory results with the patient.  The results are noted within this dictation.  Labs in 6 months: CBC diff, CMET, LDH, IgG, IgM, IgA.  I personally reviewed and went over radiographic studies with the patient.  The results are noted within this dictation.  No splenomegaly noted on CT imaging.  Mild enlargement in inguinal and abdominal lymphadenopathy compared to 5 years ago.  Chronic fatigue is at baseline.  "Is there anything you can give me for this?"   Nasal congestion and discharge.  He is on antihistamine medication.  Rx for Z-PAK provided today.  Influenza vaccine today.  Return in 6 months for follow-up.  He does not meet criteria for treatment for CLL at this time.  He knows to call us with questions or concerns regarding his CLL.  He is educated on signs and symptoms to report including fevers, chills, night sweats, unintentional weight loss, SEVERE fatigue, enlarging lymph nodes, abnormal bleeding/bruising.

## 2016-01-12 NOTE — Patient Instructions (Addendum)
[place] at [facility] Discharge Instructions  RECOMMENDATIONS MADE BY THE CONSULTANT AND ANY TEST RESULTS WILL BE SENT TO YOUR REFERRING PHYSICIAN.  You saw Kirby Crigler, PA-C, today. You had flu shot today. Z-pack prescription. Labs today. Follow up with Dr. Whitney Muse with labs in 6 months.  Thank you for choosing [place] at [facility] to provide your oncology and hematology care.  To afford each patient quality time with our provider, please arrive at least 15 minutes before your scheduled appointment time.   Beginning January 23rd 2017 lab work for the [facility] will be done in the  Main lab at [facility] on 1st floor. If you have a lab appointment with the [facility] please come in thru the  Main Entrance and check in at the main information desk  You need to re-schedule your appointment should you arrive 10 or more minutes late.  We strive to give you quality time with our providers, and arriving late affects you and other patients whose appointments are after yours.  Also, if you no show three or more times for appointments you may be dismissed from the clinic at the providers discretion.     Again, thank you for choosing [facility].  Our hope is that these requests will decrease the amount of time that you wait before being seen by our physicians.       _____________________________________________________________  Should you have questions after your visit to [facility], please contact our office at [phone] between the hours of 8:30 a.m. and 4:30 p.m.  Voicemails left after 4:30 p.m. will not be returned until the following business day.  For prescription refill requests, have your pharmacy contact our office.         Resources For Cancer Patients and their Caregivers ? American Cancer Society: Can assist with transportation, wigs, general needs, runs Look Good Feel Better.         847-254-7364 ? Cancer Care: Provides financial assistance, online support groups, medication/co-pay assistance.  1-800-813-HOPE (504)288-7985) ? [place] Assists [place] cancer patients and their families through emotional , educational and financial support.  215-399-7265 ? [place] DSS Where to apply for food stamps, Medicaid and utility assistance. 347-604-2503 ? [facility]: Transportation to medical appointments. (470)476-3900 ? Social Security Administration: May apply for disability if have a Stage IV cancer. (260)548-2928 606 413 3113 ? [employer], Disability and Transit Services: Assists with nutrition, care and transit needs. [place] Support Programs: '@10RELATIVEDAYS'$ @ > Cancer Support Group  2nd Tuesday of the month 1pm-2pm, Journey Room  > Creative Journey  3rd Tuesday of the month 1130am-1pm, Journey Room  > Look Good Feel Better  1st Wednesday of the month 10am-12 noon, Journey Room (Call American Cancer Society to register ([phone])   Influenza Virus Vaccine injection (Fluarix) What is this medicine? INFLUENZA VIRUS VACCINE (in floo EN zuh VAHY ruhs vak SEEN) helps to reduce the risk of getting influenza also known as the flu. This medicine may be used for other purposes; ask your health care provider or pharmacist if you have questions. What should I tell my health care provider before I take this medicine? They need to know if you have any of these conditions: -bleeding disorder like hemophilia -fever or infection -Guillain-Barre syndrome or other neurological problems -immune system problems -infection with the human immunodeficiency virus (HIV) or AIDS -low blood platelet counts -multiple sclerosis -  an unusual or allergic reaction to influenza virus vaccine, eggs, chicken proteins, latex, gentamicin, other medicines, foods, dyes or preservatives -pregnant or trying to get  pregnant -breast-feeding How should I use this medicine? This vaccine is for injection into a muscle. It is given by a health care professional. A copy of Vaccine Information Statements will be given before each vaccination. Read this sheet carefully each time. The sheet may change frequently. Talk to your pediatrician regarding the use of this medicine in children. Special care may be needed. Overdosage: If you think you have taken too much of this medicine contact a poison control center or emergency room at once. NOTE: This medicine is only for you. Do not share this medicine with others. What if I miss a dose? This does not apply. What may interact with this medicine? -chemotherapy or radiation therapy -medicines that lower your immune system like etanercept, anakinra, infliximab, and adalimumab -medicines that treat or prevent blood clots like warfarin -phenytoin -steroid medicines like prednisone or cortisone -theophylline -vaccines This list may not describe all possible interactions. Give your health care provider a list of all the medicines, herbs, non-prescription drugs, or dietary supplements you use. Also tell them if you smoke, drink alcohol, or use illegal drugs. Some items may interact with your medicine. What should I watch for while using this medicine? Report any side effects that do not go away within 3 days to your doctor or health care professional. Call your health care provider if any unusual symptoms occur within 6 weeks of receiving this vaccine. You may still catch the flu, but the illness is not usually as bad. You cannot get the flu from the vaccine. The vaccine will not protect against colds or other illnesses that may cause fever. The vaccine is needed every year. What side effects may I notice from receiving this medicine? Side effects that you should report to your doctor or health care professional as soon as possible: -allergic reactions like skin rash,  itching or hives, swelling of the face, lips, or tongue Side effects that usually do not require medical attention (report to your doctor or health care professional if they continue or are bothersome): -fever -headache -muscle aches and pains -pain, tenderness, redness, or swelling at site where injected -weak or tired This list may not describe all possible side effects. Call your doctor for medical advice about side effects. You may report side effects to FDA at 1-800-FDA-1088. Where should I keep my medicine? This vaccine is only given in a clinic, pharmacy, doctor's office, or other health care setting and will not be stored at home. NOTE: This sheet is a summary. It may not cover all possible information. If you have questions about this medicine, talk to your doctor, pharmacist, or health care provider.    2016, Elsevier/Gold Standard. (2007-10-14 09:30:40)

## 2016-01-12 NOTE — Progress Notes (Signed)
Alfred Brown presents today for injection per MD orders. Flu Vaccine administered IM in right Upper Arm. Administration without incident. Patient tolerated well.

## 2016-01-13 LAB — IGG, IGA, IGM
IGA: 52 mg/dL — AB (ref 61–437)
IGM, SERUM: 8 mg/dL — AB (ref 20–172)
IgG (Immunoglobin G), Serum: 408 mg/dL — ABNORMAL LOW (ref 700–1600)

## 2016-02-05 ENCOUNTER — Other Ambulatory Visit: Payer: Self-pay | Admitting: Internal Medicine

## 2016-02-05 DIAGNOSIS — M519 Unspecified thoracic, thoracolumbar and lumbosacral intervertebral disc disorder: Secondary | ICD-10-CM

## 2016-02-05 DIAGNOSIS — M47814 Spondylosis without myelopathy or radiculopathy, thoracic region: Secondary | ICD-10-CM | POA: Diagnosis not present

## 2016-02-05 DIAGNOSIS — Z1389 Encounter for screening for other disorder: Secondary | ICD-10-CM | POA: Diagnosis not present

## 2016-02-05 DIAGNOSIS — Z6823 Body mass index (BMI) 23.0-23.9, adult: Secondary | ICD-10-CM | POA: Diagnosis not present

## 2016-02-05 DIAGNOSIS — M546 Pain in thoracic spine: Secondary | ICD-10-CM | POA: Diagnosis not present

## 2016-02-05 DIAGNOSIS — J449 Chronic obstructive pulmonary disease, unspecified: Secondary | ICD-10-CM | POA: Diagnosis not present

## 2016-02-05 DIAGNOSIS — Z23 Encounter for immunization: Secondary | ICD-10-CM | POA: Diagnosis not present

## 2016-02-09 ENCOUNTER — Ambulatory Visit (HOSPITAL_COMMUNITY)
Admission: RE | Admit: 2016-02-09 | Discharge: 2016-02-09 | Disposition: A | Discharge status: Home / Self Care | Payer: Medicare Other | Source: Ambulatory Visit | Attending: Internal Medicine | Admitting: Internal Medicine

## 2016-02-09 DIAGNOSIS — Z1389 Encounter for screening for other disorder: Secondary | ICD-10-CM | POA: Insufficient documentation

## 2016-02-09 DIAGNOSIS — M519 Unspecified thoracic, thoracolumbar and lumbosacral intervertebral disc disorder: Secondary | ICD-10-CM | POA: Insufficient documentation

## 2016-02-09 DIAGNOSIS — M47814 Spondylosis without myelopathy or radiculopathy, thoracic region: Secondary | ICD-10-CM | POA: Insufficient documentation

## 2016-02-09 DIAGNOSIS — M546 Pain in thoracic spine: Secondary | ICD-10-CM | POA: Diagnosis not present

## 2016-02-16 ENCOUNTER — Other Ambulatory Visit (HOSPITAL_COMMUNITY): Payer: Medicare Other

## 2016-02-16 ENCOUNTER — Ambulatory Visit (HOSPITAL_COMMUNITY): Payer: Medicare Other | Admitting: Hematology & Oncology

## 2016-04-05 DIAGNOSIS — Z6823 Body mass index (BMI) 23.0-23.9, adult: Secondary | ICD-10-CM | POA: Diagnosis not present

## 2016-04-05 DIAGNOSIS — B354 Tinea corporis: Secondary | ICD-10-CM | POA: Diagnosis not present

## 2016-04-30 DIAGNOSIS — C9190 Lymphoid leukemia, unspecified not having achieved remission: Secondary | ICD-10-CM | POA: Diagnosis not present

## 2016-04-30 DIAGNOSIS — K219 Gastro-esophageal reflux disease without esophagitis: Secondary | ICD-10-CM | POA: Diagnosis not present

## 2016-04-30 DIAGNOSIS — I10 Essential (primary) hypertension: Secondary | ICD-10-CM | POA: Diagnosis not present

## 2016-04-30 DIAGNOSIS — E669 Obesity, unspecified: Secondary | ICD-10-CM | POA: Diagnosis not present

## 2016-04-30 DIAGNOSIS — Z1389 Encounter for screening for other disorder: Secondary | ICD-10-CM | POA: Diagnosis not present

## 2016-04-30 DIAGNOSIS — J449 Chronic obstructive pulmonary disease, unspecified: Secondary | ICD-10-CM | POA: Diagnosis not present

## 2016-04-30 DIAGNOSIS — M545 Low back pain: Secondary | ICD-10-CM | POA: Diagnosis not present

## 2016-04-30 DIAGNOSIS — Z6823 Body mass index (BMI) 23.0-23.9, adult: Secondary | ICD-10-CM | POA: Diagnosis not present

## 2016-04-30 DIAGNOSIS — E782 Mixed hyperlipidemia: Secondary | ICD-10-CM | POA: Diagnosis not present

## 2016-06-03 DIAGNOSIS — Z1389 Encounter for screening for other disorder: Secondary | ICD-10-CM | POA: Diagnosis not present

## 2016-06-03 DIAGNOSIS — J329 Chronic sinusitis, unspecified: Secondary | ICD-10-CM | POA: Diagnosis not present

## 2016-06-03 DIAGNOSIS — Z6824 Body mass index (BMI) 24.0-24.9, adult: Secondary | ICD-10-CM | POA: Diagnosis not present

## 2016-06-03 DIAGNOSIS — R300 Dysuria: Secondary | ICD-10-CM | POA: Diagnosis not present

## 2016-06-03 DIAGNOSIS — L219 Seborrheic dermatitis, unspecified: Secondary | ICD-10-CM | POA: Diagnosis not present

## 2016-06-03 DIAGNOSIS — C9190 Lymphoid leukemia, unspecified not having achieved remission: Secondary | ICD-10-CM | POA: Diagnosis not present

## 2016-07-08 NOTE — Progress Notes (Signed)
[facility], [place] [address]   CLINIC:  Medical Oncology/Hematology  PCP:  [name], MD [address] [place] [address] [phone]   REASON FOR VISIT:  Follow-up for CLL (Rai Stage I) AND Hypogammaglobulinemia  CURRENT THERAPY: Observation     HISTORY OF PRESENT ILLNESS:  (From Kirby Crigler, PA-C's last note on 01/12/16)     INTERVAL HISTORY:  Alfred Brown 66 y.o. male presents for routine follow-up for CLL and hypogammaglobulinemia.   He is here today with his wife.  Overall, he tells me he has been feeling pretty well. Endorses continued fatigue, which has been chronic and has not worsened. Energy levels are at about 50% of his baseline.  He is still able to care for himself and remains pretty active.  Denies fever, chills, night sweats, or new lump/mass.  His appetite is excellent; he has gained ~2 lbs since his last visit 6 months ago.   He has chronic cough, which he attributes to COPD and smoking. He currently smokes about 1 ppd. He has been smoking for many years; he has been working on cutting back. He has set "rules" for himself and is not allowed to smoke inside at home any longer. This has helped curb his cigarette smoking some.  He tried Chantix in the past, "but it made me want to smoke more."  He has never tried the nicotine patches/gum/lozenges.    Reports generalized rash; his PCP is helping manage this and has given him a salve for it. He thinks the rash is improving some.   Otherwise, he is largely without complaints today.     REVIEW OF SYSTEMS:  Review of Systems  Constitutional: Positive for fatigue. Negative for appetite change, chills, diaphoresis, fever and unexpected weight change.  HENT:  Negative.  Negative for lump/mass.   Eyes: Negative.   Respiratory: Positive for cough. Negative for shortness of breath.   Cardiovascular: Negative.  Negative for chest pain.  Gastrointestinal: Negative.   Negative for abdominal pain, blood in stool, constipation, diarrhea, nausea and vomiting.  Endocrine: Negative.   Genitourinary: Negative.  Negative for dysuria and hematuria.   Musculoskeletal: Negative.   Skin: Positive for rash (PCP managing ).  Neurological: Negative.  Negative for dizziness and headaches.  Hematological: Negative.   Psychiatric/Behavioral: Negative.      PAST MEDICAL/SURGICAL HISTORY:  Past Medical History:  Diagnosis Date  . Aortic valve disorder    Mild insufficiency  . Chronic lymphocytic leukemia ([place])    01/2012: WBC-90.2, H&H-15.1/45.3, platelets-185 03/31/12: Verified with Dr. Tressie Stalker that no precautions or modification of medical regime are required prior to orthopaedic surgery.   . CLL (chronic lymphocytic leukemia) ([place])   . CMV (cytomegalovirus) ([place])   . COPD (chronic obstructive pulmonary disease) ([place])   . Depression with anxiety   . DJD (degenerative joint disease)   . GERD (gastroesophageal reflux disease)   . Hyperlipidemia   . Lipoma    left chest wall  . Palpitations 2004   PVCs; borderline stress nuclear in 2004, negative in 2007; Echo 2007; AV-sclerotic, very mild AI  . Right bundle branch block    + left posterior fascicular block  . Tobacco abuse    80 pack years   Past Surgical History:  Procedure Laterality Date  . COLONOSCOPY  01/2012   Negative screening study  . GANGLION CYST EXCISION  1997   left wrist  . ROTATOR CUFF REPAIR  1997   right  .  SHOULDER ARTHROSCOPY WITH SUBACROMIAL DECOMPRESSION  04/09/2012   Procedure: SHOULDER ARTHROSCOPY WITH SUBACROMIAL DECOMPRESSION;  Surgeon: Ninetta Lights, MD;  Location: [facility];  Service: Orthopedics;  Laterality: Left;  LEFT SHOULDER ARTHROSCOPY, SUBACROMIAL DECOMPRESSION, PARTIAL ACROMIOPLASTY WITH CORACOACROMIAL RELEASE, DISTAL CLAVICULECTOMY WITH ROTATOR CUFF REPAIR, DEBRIDEMENT OF LABRUM     SOCIAL HISTORY:  Social History   Social History  . Marital  status: Married    Spouse name: N/A  . Number of children: N/A  . Years of education: N/A   Occupational History  . Not on file.   Social History Main Topics  . Smoking status: Current Every Day Smoker    Packs/day: 1.00    Years: 45.00    Types: Cigarettes  . Smokeless tobacco: Never Used     Comment: Consumption decreased approximately 2010 to one pack per day  . Alcohol use No  . Drug use: No  . Sexual activity: Not on file   Other Topics Concern  . Not on file   Social History Narrative  . No narrative on file    FAMILY HISTORY:  Family History  Problem Relation Age of Onset  . Stroke Mother   . Heart attack Father   . Cancer Sister     colon  . Colon cancer Sister   . Cancer Brother     2 brothers died with lung cancer    CURRENT MEDICATIONS:  Outpatient Encounter Prescriptions as of 07/09/2016  Medication Sig  . acyclovir (ZOVIRAX) 800 MG tablet Take 800 mg by mouth 5 (five) times daily as needed (outbreak).   [name] albuterol (PROVENTIL HFA;VENTOLIN HFA) 108 (90 BASE) MCG/ACT inhaler Inhale 2 puffs into the lungs every 6 (six) hours as needed for wheezing or shortness of breath.   . ALPRAZolam (XANAX) 1 MG tablet Take 1 mg by mouth 3 (three) times daily as needed for anxiety.   [name] aspirin 325 MG tablet Take 325 mg by mouth daily.    [name] buPROPion (WELLBUTRIN SR) 150 MG 12 hr tablet Take 150 mg by mouth daily.   . calcium carbonate (OS-CAL) 600 MG TABS Take 600 mg by mouth daily.    . clotrimazole-betamethasone (LOTRISONE) cream Apply 1 application topically daily as needed. Affected area  . Diphenhydramine-PE-APAP 12.5-5-325 MG TABS Take 1 tablet by mouth as needed (sinus). sinus  . fish oil-omega-3 fatty acids 1000 MG capsule Take 2 g by mouth daily.    . fluticasone (FLONASE) 50 MCG/ACT nasal spray Place 2 sprays into the nose daily.    [name] HYDROcodone-acetaminophen (NORCO/VICODIN) 5-325 MG per tablet Take 1 tablet by mouth every 6 (six) hours as needed for moderate  pain.   . Loratadine 10 MG CAPS Take 10 mg by mouth daily.   [name] lovastatin (MEVACOR) 20 MG tablet Take 20 mg by mouth daily.  . Multiple Vitamin (MULTIVITAMIN) tablet Take 1 tablet by mouth daily.    . naproxen (NAPROSYN) 500 MG tablet Take 500 mg by mouth 2 (two) times daily as needed for mild pain or moderate pain.   . niacin (NIASPAN) 500 MG CR tablet Take 500 mg by mouth daily.  . [DISCONTINUED] azithromycin (ZITHROMAX) 250 MG tablet As directed  . [DISCONTINUED] hydrOXYzine (ATARAX/VISTARIL) 25 MG tablet Take 25 mg by mouth every 8 (eight) hours as needed for anxiety or itching.    No facility-administered encounter medications on file as of 07/09/2016.     ALLERGIES:  No Known Allergies   PHYSICAL EXAM:  ECOG Performance  status: 1 - Symptomatic, but independent.   Vitals:   07/09/16 1017  BP: (!) 139/59  Pulse: 84  Resp: 18  Temp: 98 F (36.7 C)   Filed Weights   07/09/16 1017  Weight: 180 lb 4.8 oz (81.8 kg)    Physical Exam  Constitutional: He is oriented to person, place, and time and well-developed, well-nourished, and in no distress.  HENT:  Head: Normocephalic.  Mild posterior oropharyngeal erythema (likely secondary to cigarette smoking).   Eyes: Conjunctivae are normal. Pupils are equal, round, and reactive to light. No scleral icterus.  Neck: Normal range of motion. Neck supple.  Cardiovascular: Normal rate, regular rhythm and normal heart sounds.   Pulmonary/Chest: Effort normal and breath sounds normal. No respiratory distress. He has no wheezes.  Abdominal: Soft. Bowel sounds are normal. There is no tenderness. There is no rebound and no guarding.  Musculoskeletal: Normal range of motion. He exhibits no edema.  Lymphadenopathy:    He has no cervical adenopathy.       Right: No supraclavicular adenopathy present.       Left: No supraclavicular adenopathy present.  Neurological: He is alert and oriented to person, place, and time. No cranial nerve deficit.  Gait normal.  Skin: Skin is warm and dry.  Skin is very dry.   Psychiatric: Mood, memory, affect and judgment normal.  Nursing note and vitals reviewed.    LABORATORY DATA:  I have reviewed the labs as listed.  CBC    Component Value Date/Time   WBC 144.3 (HH) 07/09/2016 0951   RBC 4.93 07/09/2016 0951   HGB 15.3 07/09/2016 0951   HCT 47.0 07/09/2016 0951   PLT 128 (L) 07/09/2016 0951   MCV 95.3 07/09/2016 0951   MCH 31.0 07/09/2016 0951   MCHC 32.6 07/09/2016 0951   RDW 14.5 07/09/2016 0951   LYMPHSABS 135.6 (H) 07/09/2016 0951   MONOABS 2.9 (H) 07/09/2016 0951   EOSABS 0.0 07/09/2016 0951   BASOSABS 0.0 07/09/2016 0951   CMP Latest Ref Rng & Units 07/09/2016 01/12/2016 08/21/2015  Glucose 65 - 99 mg/dL 131(H) 95 112(H)  BUN 6 - 20 mg/dL 16 22(H) 18  Creatinine 0.61 - 1.24 mg/dL 0.89 0.98 0.83  Sodium 135 - 145 mmol/L 139 140 138  Potassium 3.5 - 5.1 mmol/L 4.1 4.2 4.2  Chloride 101 - 111 mmol/L 106 110 107  CO2 22 - 32 mmol/L '28 26 27  '$ Calcium 8.9 - 10.3 mg/dL 9.1 9.1 8.8(L)  Total Protein 6.5 - 8.1 g/dL 6.5 6.6 6.4(L)  Total Bilirubin 0.3 - 1.2 mg/dL 0.6 0.6 0.5  Alkaline Phos 38 - 126 U/L 127(H) 119 119  AST 15 - 41 U/L '24 20 21  '$ ALT 17 - 63 U/L 19 17 15(L)   Results for ORIS, STAFFIERI (MRN [id number])  Ref. Range 07/09/2016 09:51  LDH Latest Ref Range: 98 - 192 U/L 137     PENDING LABS:  IgG, IgM, & IgA pending.    DIAGNOSTIC IMAGING:  CT abd/pelvis: 12/07/15     PATHOLOGY:     ASSESSMENT & PLAN:   CLL:   -Diagnosed in 2004; no current active treatment.  -WBCs elevated, but stable at 144.3 today; thrombocytopenia continues to be mildly low at 128,000. No active bleeding, infection, night sweats, fevers, or weight loss.  He has fatigue, but is able to care for himself and is largely stable.  -We reviewed the NCCN guidelines for when to start treatment for CLL.   -Return to  cancer center in 6 months with labs for continued monitoring. He knows to call  us if he has any new or worsening symptoms and we could see him sooner, if needed.   NCCN guidelines for indication for treatment of CLL are:  A. Eligible for clinical trial  B. Significant disease-related symptoms   1. Fatigue (severe)   2. Night sweats   3. Weight loss   4. Fever without infection  C. Threatened end-organ function  D. Progressive bulky disease (spleen >6cm below costal margin, lymph nodes >10 cm)  E. Progressive anemia  F. Progressive thrombocytopenia.   Hypogammaglobulinemia:  -IgG, IgA, and IgM pending; no recent infections or fevers. Continue to monitor.   Tobacco use disorder:  -We discussed the pathophysiology of nicotine dependence and different strategies to stop smoking.   The gold standard of tobacco cessation is nicotine replacement (with nicotine patches & gum/lozenges) or Varenicline (Chantix).  He has tried Chantix in the past and "it made me want to smoke more."  We discussed that the typical craving/urge to smoke lasts about 3-5 minutes; his biggest trigger(s) to use cigarettes is stress and drinking coffee.  Encouraged him to set some new "rules" that do not allow him to smoke with coffee (begin de-coupling these activities); instead he could use a piece of nicotine gum or a lozenge when he has a craving. I gave him instructions on how to use these nicotine replacement products.  AlfredTully  understands that data suggests that "cold [place]" is the least effective way to stop using tobacco products.  Having a clear "quit plan" is much more effective and requires a step-wise approach with continued support from a tobacco treatment specialist.  I encouraged him to reach out to me if he has further questions or interest in his continued cessation efforts.  Greater than 5 minutes was spent in smoking cessation counseling with this patient.        Dispo:  -Return to cancer center in 6 months with labs.    All questions were answered to patient's stated  satisfaction. Encouraged patient to call with any new concerns or questions before his next visit to the cancer center and we can certain see him sooner, if needed.    Plan of care discussed with Dr. Talbert Cage, who agrees with the above aforementioned.      Orders placed this encounter:  Orders Placed This Encounter  Procedures  . CBC with Differential/Platelet  . Comprehensive metabolic panel  . Lactate dehydrogenase  . IgG, IgA, IgM      Mike Craze, NP [place] [phone]

## 2016-07-09 ENCOUNTER — Encounter (HOSPITAL_COMMUNITY): Payer: Medicare Other | Attending: Oncology

## 2016-07-09 ENCOUNTER — Other Ambulatory Visit (HOSPITAL_COMMUNITY): Payer: Self-pay | Admitting: Oncology

## 2016-07-09 ENCOUNTER — Encounter (HOSPITAL_COMMUNITY): Payer: Self-pay | Admitting: Adult Health

## 2016-07-09 ENCOUNTER — Encounter (HOSPITAL_BASED_OUTPATIENT_CLINIC_OR_DEPARTMENT_OTHER): Payer: Medicare Other | Admitting: Adult Health

## 2016-07-09 VITALS — BP 139/59 | HR 84 | Temp 98.0°F | Resp 18 | Ht 72.0 in | Wt 180.3 lb

## 2016-07-09 DIAGNOSIS — C9110 Chronic lymphocytic leukemia of B-cell type not having achieved remission: Secondary | ICD-10-CM

## 2016-07-09 DIAGNOSIS — C9190 Lymphoid leukemia, unspecified not having achieved remission: Secondary | ICD-10-CM | POA: Insufficient documentation

## 2016-07-09 DIAGNOSIS — D801 Nonfamilial hypogammaglobulinemia: Secondary | ICD-10-CM | POA: Diagnosis not present

## 2016-07-09 DIAGNOSIS — Z720 Tobacco use: Secondary | ICD-10-CM | POA: Diagnosis not present

## 2016-07-09 LAB — COMPREHENSIVE METABOLIC PANEL
ALK PHOS: 127 U/L — AB (ref 38–126)
ALT: 19 U/L (ref 17–63)
ANION GAP: 5 (ref 5–15)
AST: 24 U/L (ref 15–41)
Albumin: 4.4 g/dL (ref 3.5–5.0)
BILIRUBIN TOTAL: 0.6 mg/dL (ref 0.3–1.2)
BUN: 16 mg/dL (ref 6–20)
CALCIUM: 9.1 mg/dL (ref 8.9–10.3)
CO2: 28 mmol/L (ref 22–32)
Chloride: 106 mmol/L (ref 101–111)
Creatinine, Ser: 0.89 mg/dL (ref 0.61–1.24)
GFR calc Af Amer: >60 mL/min (ref >60)
GFR calc non Af Amer: >60 mL/min (ref >60)
Glucose, Bld: 131 mg/dL — ABNORMAL HIGH (ref 65–99)
Potassium: 4.1 mmol/L (ref 3.5–5.1)
SODIUM: 139 mmol/L (ref 135–145)
TOTAL PROTEIN: 6.5 g/dL (ref 6.5–8.1)

## 2016-07-09 LAB — LACTATE DEHYDROGENASE: LDH: 137 U/L (ref 98–192)

## 2016-07-09 LAB — CBC WITH DIFFERENTIAL/PLATELET
Basophils Absolute: 0 K/uL (ref 0.0–0.1)
Basophils Relative: 0 %
Eosinophils Absolute: 0 K/uL (ref 0.0–0.7)
Eosinophils Relative: 0 %
HCT: 47 % (ref 39.0–52.0)
Hemoglobin: 15.3 g/dL (ref 13.0–17.0)
Lymphocytes Relative: 94 %
Lymphs Abs: 135.6 K/uL — ABNORMAL HIGH (ref 0.7–4.0)
MCH: 31 pg (ref 26.0–34.0)
MCHC: 32.6 g/dL (ref 30.0–36.0)
MCV: 95.3 fL (ref 78.0–100.0)
Monocytes Absolute: 2.9 K/uL — ABNORMAL HIGH (ref 0.1–1.0)
Monocytes Relative: 2 %
Neutro Abs: 5.8 K/uL (ref 1.7–7.7)
Neutrophils Relative %: 4 %
Platelets: 128 K/uL — ABNORMAL LOW (ref 150–400)
RBC: 4.93 MIL/uL (ref 4.22–5.81)
RDW: 14.5 % (ref 11.5–15.5)
WBC: 144.3 K/uL (ref 4.0–10.5)

## 2016-07-09 NOTE — Progress Notes (Signed)
CRITICAL VALUE ALERT Critical value received:  WBC-144.3 Date of notification:  07/09/16 Time of notification: 3276 Critical value read back:  Yes.   Nurse who received alert:  M.Wilna Pennie, LPN MD notified (1st page):  G.Dawson, NP

## 2016-07-09 NOTE — Patient Instructions (Addendum)
[facility] at [facility] Discharge Instructions  RECOMMENDATIONS MADE BY THE CONSULTANT AND ANY TEST RESULTS WILL BE SENT TO YOUR REFERRING PHYSICIAN.  You were seen today by Gretchen Dawson NP. Return in 6 months for labs and follow up.  Thank you for choosing [facility] at [facility] to provide your oncology and hematology care.  To afford each patient quality time with our provider, please arrive at least 15 minutes before your scheduled appointment time.    If you have a lab appointment with the Cancer Center please come in thru the  Main Entrance and check in at the main information desk  You need to re-schedule your appointment should you arrive 10 or more minutes late.  We strive to give you quality time with our providers, and arriving late affects you and other patients whose appointments are after yours.  Also, if you no show three or more times for appointments you may be dismissed from the clinic at the providers discretion.     Again, thank you for choosing [facility].  Our hope is that these requests will decrease the amount of time that you wait before being seen by our physicians.       _____________________________________________________________  Should you have questions after your visit to [facility], please contact our office at [phone] between the hours of 8:30 a.m. and 4:30 p.m.  Voicemails left after 4:30 p.m. will not be returned until the following business day.  For prescription refill requests, have your pharmacy contact our office.       Resources For Cancer Patients and their Caregivers ? American Cancer Society: Can assist with transportation, wigs, general needs, runs Look Good Feel Better.        1-888-227-6333 ? Cancer Care: Provides financial assistance, online support groups, medication/co-pay assistance.  1-800-813-HOPE (4673) ? [facility] Assists [place] cancer patients and their families through emotional , educational and financial support.  336-427-4357 ? [place] DSS Where to apply for food stamps, Medicaid and utility assistance. 336-342-1394 ? [facility]: Transportation to medical appointments. 336-347-2287 ? Social Security Administration: May apply for disability if have a Stage IV cancer. 336-342-7796 1-800-772-1213 ? [place] Aging, Disability and Transit Services: Assists with nutrition, care and transit needs. 336-349-2343  Cancer Center Support Programs: @10RELATIVEDAYS@ > Cancer Support Group  2nd Tuesday of the month 1pm-2pm, Journey Room  > Creative Journey  3rd Tuesday of the month 1130am-1pm, Journey Room  > Look Good Feel Better  1st Wednesday of the month 10am-12 noon, Journey Room (Call American Cancer Society to register 1-800-395-5775)    

## 2016-07-10 LAB — IGG, IGA, IGM
IGA: 30 mg/dL — AB (ref 61–437)
IgG (Immunoglobin G), Serum: 393 mg/dL — ABNORMAL LOW (ref 700–1600)
IgM, Serum: 12 mg/dL — ABNORMAL LOW (ref 20–172)

## 2016-07-12 ENCOUNTER — Ambulatory Visit (HOSPITAL_COMMUNITY): Payer: Medicare Other | Admitting: Adult Health

## 2016-07-12 ENCOUNTER — Other Ambulatory Visit (HOSPITAL_COMMUNITY): Payer: Medicare Other

## 2016-07-25 DIAGNOSIS — X32XXXD Exposure to sunlight, subsequent encounter: Secondary | ICD-10-CM | POA: Diagnosis not present

## 2016-07-25 DIAGNOSIS — B078 Other viral warts: Secondary | ICD-10-CM | POA: Diagnosis not present

## 2016-07-25 DIAGNOSIS — L570 Actinic keratosis: Secondary | ICD-10-CM | POA: Diagnosis not present

## 2016-10-08 ENCOUNTER — Encounter (HOSPITAL_COMMUNITY): Payer: Medicare Other | Attending: Oncology

## 2016-10-08 DIAGNOSIS — C9110 Chronic lymphocytic leukemia of B-cell type not having achieved remission: Secondary | ICD-10-CM

## 2016-10-08 DIAGNOSIS — C9190 Lymphoid leukemia, unspecified not having achieved remission: Secondary | ICD-10-CM | POA: Diagnosis not present

## 2016-10-08 LAB — CBC WITH DIFFERENTIAL/PLATELET
BASOS PCT: 0 %
Basophils Absolute: 0 10*3/uL (ref 0.0–0.1)
EOS PCT: 0 %
Eosinophils Absolute: 0 10*3/uL (ref 0.0–0.7)
HCT: 45.4 % (ref 39.0–52.0)
Hemoglobin: 15 g/dL (ref 13.0–17.0)
LYMPHS ABS: 109.6 10*3/uL — AB (ref 0.7–4.0)
Lymphocytes Relative: 93 %
MCH: 30.7 pg (ref 26.0–34.0)
MCHC: 33 g/dL (ref 30.0–36.0)
MCV: 93 fL (ref 78.0–100.0)
Monocytes Absolute: 2.4 10*3/uL — ABNORMAL HIGH (ref 0.1–1.0)
Monocytes Relative: 2 %
NEUTROS ABS: 5.9 10*3/uL (ref 1.7–7.7)
Neutrophils Relative %: 5 %
PLATELETS: 128 10*3/uL — AB (ref 150–400)
RBC: 4.88 MIL/uL (ref 4.22–5.81)
RDW: 13.6 % (ref 11.5–15.5)
WBC: 117.9 10*3/uL — AB (ref 4.0–10.5)

## 2016-10-08 LAB — COMPREHENSIVE METABOLIC PANEL
ALT: 15 U/L — ABNORMAL LOW (ref 17–63)
AST: 24 U/L (ref 15–41)
Albumin: 4.4 g/dL (ref 3.5–5.0)
Alkaline Phosphatase: 120 U/L (ref 38–126)
Anion gap: 8 (ref 5–15)
BILIRUBIN TOTAL: 0.8 mg/dL (ref 0.3–1.2)
BUN: 16 mg/dL (ref 6–20)
CHLORIDE: 110 mmol/L (ref 101–111)
CO2: 23 mmol/L (ref 22–32)
Calcium: 9.2 mg/dL (ref 8.9–10.3)
Creatinine, Ser: 0.95 mg/dL (ref 0.61–1.24)
GFR calc non Af Amer: >60 mL/min (ref >60)
Glucose, Bld: 110 mg/dL — ABNORMAL HIGH (ref 65–99)
POTASSIUM: 4.2 mmol/L (ref 3.5–5.1)
Sodium: 141 mmol/L (ref 135–145)
TOTAL PROTEIN: 6.4 g/dL — AB (ref 6.5–8.1)

## 2016-10-08 LAB — LACTATE DEHYDROGENASE: LDH: 148 U/L (ref 98–192)

## 2016-10-08 NOTE — Progress Notes (Unsigned)
CRITICAL VALUE ALERT Critical value received: WBC 1117.9  Date of notification:  10/08/16 Time of notification: 4037 Critical value read back:  Yes.   Nurse who received alert:  Isidoro Donning RN Tom PA-C notified.

## 2016-10-10 DIAGNOSIS — M47812 Spondylosis without myelopathy or radiculopathy, cervical region: Secondary | ICD-10-CM | POA: Diagnosis not present

## 2016-10-10 DIAGNOSIS — Z1389 Encounter for screening for other disorder: Secondary | ICD-10-CM | POA: Diagnosis not present

## 2016-10-10 DIAGNOSIS — J449 Chronic obstructive pulmonary disease, unspecified: Secondary | ICD-10-CM | POA: Diagnosis not present

## 2016-10-10 DIAGNOSIS — M1991 Primary osteoarthritis, unspecified site: Secondary | ICD-10-CM | POA: Diagnosis not present

## 2016-10-10 DIAGNOSIS — Z6823 Body mass index (BMI) 23.0-23.9, adult: Secondary | ICD-10-CM | POA: Diagnosis not present

## 2016-10-14 DIAGNOSIS — B078 Other viral warts: Secondary | ICD-10-CM | POA: Diagnosis not present

## 2016-12-09 ENCOUNTER — Encounter (HOSPITAL_BASED_OUTPATIENT_CLINIC_OR_DEPARTMENT_OTHER): Payer: Medicare Other | Admitting: Oncology

## 2016-12-09 ENCOUNTER — Encounter (HOSPITAL_COMMUNITY): Payer: Self-pay

## 2016-12-09 ENCOUNTER — Encounter (HOSPITAL_COMMUNITY): Payer: Medicare Other | Attending: Oncology

## 2016-12-09 VITALS — BP 137/68 | HR 83 | Resp 16 | Ht 72.0 in | Wt 180.4 lb

## 2016-12-09 DIAGNOSIS — F418 Other specified anxiety disorders: Secondary | ICD-10-CM | POA: Diagnosis not present

## 2016-12-09 DIAGNOSIS — Z79899 Other long term (current) drug therapy: Secondary | ICD-10-CM | POA: Diagnosis not present

## 2016-12-09 DIAGNOSIS — F17210 Nicotine dependence, cigarettes, uncomplicated: Secondary | ICD-10-CM | POA: Diagnosis not present

## 2016-12-09 DIAGNOSIS — Z720 Tobacco use: Secondary | ICD-10-CM | POA: Diagnosis not present

## 2016-12-09 DIAGNOSIS — M1990 Unspecified osteoarthritis, unspecified site: Secondary | ICD-10-CM | POA: Insufficient documentation

## 2016-12-09 DIAGNOSIS — Z7982 Long term (current) use of aspirin: Secondary | ICD-10-CM | POA: Diagnosis not present

## 2016-12-09 DIAGNOSIS — D801 Nonfamilial hypogammaglobulinemia: Secondary | ICD-10-CM

## 2016-12-09 DIAGNOSIS — C9110 Chronic lymphocytic leukemia of B-cell type not having achieved remission: Secondary | ICD-10-CM | POA: Diagnosis not present

## 2016-12-09 DIAGNOSIS — K219 Gastro-esophageal reflux disease without esophagitis: Secondary | ICD-10-CM | POA: Insufficient documentation

## 2016-12-09 DIAGNOSIS — I4510 Unspecified right bundle-branch block: Secondary | ICD-10-CM | POA: Insufficient documentation

## 2016-12-09 DIAGNOSIS — J449 Chronic obstructive pulmonary disease, unspecified: Secondary | ICD-10-CM | POA: Insufficient documentation

## 2016-12-09 DIAGNOSIS — E785 Hyperlipidemia, unspecified: Secondary | ICD-10-CM | POA: Insufficient documentation

## 2016-12-09 LAB — CBC WITH DIFFERENTIAL/PLATELET
BASOS ABS: 0 10*3/uL (ref 0.0–0.1)
Basophils Relative: 0 %
EOS ABS: 0 10*3/uL (ref 0.0–0.7)
EOS PCT: 0 %
HCT: 46.3 % (ref 39.0–52.0)
Hemoglobin: 15 g/dL (ref 13.0–17.0)
Lymphocytes Relative: 93 %
Lymphs Abs: 121.3 10*3/uL — ABNORMAL HIGH (ref 0.7–4.0)
MCH: 30.7 pg (ref 26.0–34.0)
MCHC: 32.4 g/dL (ref 30.0–36.0)
MCV: 94.9 fL (ref 78.0–100.0)
MONO ABS: 2.6 10*3/uL — AB (ref 0.1–1.0)
Monocytes Relative: 2 %
NEUTROS PCT: 5 %
Neutro Abs: 6.5 10*3/uL (ref 1.7–7.7)
PLATELETS: 143 10*3/uL — AB (ref 150–400)
RBC: 4.88 MIL/uL (ref 4.22–5.81)
RDW: 14.2 % (ref 11.5–15.5)
WBC: 130.4 10*3/uL — AB (ref 4.0–10.5)

## 2016-12-09 LAB — COMPREHENSIVE METABOLIC PANEL
ALT: 17 U/L (ref 17–63)
AST: 25 U/L (ref 15–41)
Albumin: 4.4 g/dL (ref 3.5–5.0)
Alkaline Phosphatase: 130 U/L — ABNORMAL HIGH (ref 38–126)
Anion gap: 9 (ref 5–15)
BILIRUBIN TOTAL: 0.8 mg/dL (ref 0.3–1.2)
BUN: 18 mg/dL (ref 6–20)
CO2: 28 mmol/L (ref 22–32)
Calcium: 9.2 mg/dL (ref 8.9–10.3)
Chloride: 104 mmol/L (ref 101–111)
Creatinine, Ser: 0.91 mg/dL (ref 0.61–1.24)
Glucose, Bld: 106 mg/dL — ABNORMAL HIGH (ref 65–99)
POTASSIUM: 4.5 mmol/L (ref 3.5–5.1)
Sodium: 141 mmol/L (ref 135–145)
TOTAL PROTEIN: 6.6 g/dL (ref 6.5–8.1)

## 2016-12-09 LAB — LACTATE DEHYDROGENASE: LDH: 151 U/L (ref 98–192)

## 2016-12-09 NOTE — Progress Notes (Signed)
[place], [place] [id number]   CLINIC:  Medical Oncology/Hematology  PCP:  [facility], [place] [address] [phone]   REASON FOR VISIT:  Follow-up for CLL (Rai Stage I) AND Hypogammaglobulinemia  CURRENT THERAPY: Observation     HISTORY OF PRESENT ILLNESS:  (From Alfred Crigler, PA-C's last note on 01/12/16)     INTERVAL HISTORY:  [name] 66 y.o. male presents for routine follow-up for CLL and hypogammaglobulinemia.   He is here today with his wife.  Overall, patient states that he has been doing well. He does have intermittent fatigue where he needs to rest but he states that his fatigue always improves after he stops depressed. He denies any progressive worsening of his fatigue. He denies palpating any new lymphadenopathy. He denies any drenching night sweats, weight loss, unexplained fevers or chills or recurrent infections. He continues to smoke 1 pack per day. Otherwise review of systems was negative.  REVIEW OF SYSTEMS:  Review of Systems  Constitutional: Positive for fatigue. Negative for appetite change, chills, diaphoresis, fever and unexpected weight change.  HENT:  Negative.  Negative for lump/mass.   Eyes: Negative.   Respiratory: Negative for cough and shortness of breath.   Cardiovascular: Negative.  Negative for chest pain.  Gastrointestinal: Negative.  Negative for abdominal pain, blood in stool, constipation, diarrhea, nausea and vomiting.  Endocrine: Negative.   Genitourinary: Negative.  Negative for dysuria and hematuria.   Musculoskeletal: Negative.   Skin: Negative.  Negative for rash.  Neurological: Negative.  Negative for dizziness and headaches.  Hematological: Negative.   Psychiatric/Behavioral: Negative.      PAST MEDICAL/SURGICAL HISTORY:  Past Medical History:  Diagnosis Date  . Aortic valve disorder    Mild insufficiency  . Chronic lymphocytic leukemia ([place])    01/2012: WBC-90.2, H&H-15.1/45.3, platelets-185 03/31/12: Verified with Dr. Tressie Stalker that no precautions or modification of medical regime are required prior to orthopaedic surgery.   . CLL (chronic lymphocytic leukemia) ([place])   . CMV (cytomegalovirus) ([place])   . COPD (chronic obstructive pulmonary disease) ([place])   . Depression with anxiety   . DJD (degenerative joint disease)   . GERD (gastroesophageal reflux disease)   . Hyperlipidemia   . Lipoma    left chest wall  . Palpitations 2004   PVCs; borderline stress nuclear in 2004, negative in 2007; Echo 2007; AV-sclerotic, very mild AI  . Right bundle branch block    + left posterior fascicular block  . Tobacco abuse    80 pack years   Past Surgical History:  Procedure Laterality Date  . COLONOSCOPY  01/2012   Negative screening study  . GANGLION CYST EXCISION  1997   left wrist  . ROTATOR CUFF REPAIR  1997   right  . SHOULDER ARTHROSCOPY WITH SUBACROMIAL DECOMPRESSION  04/09/2012   Procedure: SHOULDER ARTHROSCOPY WITH SUBACROMIAL DECOMPRESSION;  Surgeon: Ninetta Lights, MD;  Location: [facility];  Service: Orthopedics;  Laterality: Left;  LEFT SHOULDER ARTHROSCOPY, SUBACROMIAL DECOMPRESSION, PARTIAL ACROMIOPLASTY WITH CORACOACROMIAL RELEASE, DISTAL CLAVICULECTOMY WITH ROTATOR CUFF REPAIR, DEBRIDEMENT OF LABRUM     SOCIAL HISTORY:  Social History   Social History  . Marital status: Married    Spouse name: N/A  . Number of children: N/A  . Years of education: N/A   Occupational History  . Not on file.   Social History Main Topics  . Smoking status: Current Every Day Smoker    Packs/day: 1.00  Years: 45.00    Types: Cigarettes  . Smokeless tobacco: Never Used     Comment: Consumption decreased approximately 2010 to one pack per day  . Alcohol use No  . Drug use: No  . Sexual activity: Not on file   Other Topics Concern  . Not on file   Social History Narrative  . No narrative on file    FAMILY  HISTORY:  Family History  Problem Relation Age of Onset  . Stroke Mother   . Heart attack Father   . Cancer Sister        colon  . Colon cancer Sister   . Cancer Brother        2 brothers died with lung cancer    CURRENT MEDICATIONS:  Outpatient Encounter Prescriptions as of 12/09/2016  Medication Sig  . acyclovir (ZOVIRAX) 800 MG tablet Take 800 mg by mouth 5 (five) times daily as needed (outbreak).   Alfred Brown albuterol (PROVENTIL HFA;VENTOLIN HFA) 108 (90 BASE) MCG/ACT inhaler Inhale 2 puffs into the lungs every 6 (six) hours as needed for wheezing or shortness of breath.   . ALPRAZolam (XANAX) 1 MG tablet Take 1 mg by mouth 3 (three) times daily as needed for anxiety.   Alfred Brown aspirin 325 MG tablet Take 325 mg by mouth daily.    Alfred Brown buPROPion (WELLBUTRIN SR) 150 MG 12 hr tablet Take 150 mg by mouth daily.   . calcium carbonate (OS-CAL) 600 MG TABS Take 600 mg by mouth daily.    . clotrimazole-betamethasone (LOTRISONE) cream Apply 1 application topically daily as needed. Affected area  . Diphenhydramine-PE-APAP 12.5-5-325 MG TABS Take 1 tablet by mouth as needed (sinus). sinus  . fish oil-omega-3 fatty acids 1000 MG capsule Take 2 g by mouth daily.    . fluticasone (FLONASE) 50 MCG/ACT nasal spray Place 2 sprays into the nose daily.    Alfred Brown HYDROcodone-acetaminophen (NORCO/VICODIN) 5-325 MG per tablet Take 1 tablet by mouth every 6 (six) hours as needed for moderate pain.   . Loratadine 10 MG CAPS Take 10 mg by mouth daily.   Alfred Brown lovastatin (MEVACOR) 20 MG tablet Take 20 mg by mouth daily.  . Multiple Vitamin (MULTIVITAMIN) tablet Take 1 tablet by mouth daily.    . naproxen (NAPROSYN) 500 MG tablet Take 500 mg by mouth 2 (two) times daily as needed for mild pain or moderate pain.   . niacin (NIASPAN) 500 MG CR tablet Take 500 mg by mouth daily.   No facility-administered encounter medications on file as of 12/09/2016.     ALLERGIES:  No Known Allergies   PHYSICAL EXAM:  ECOG Performance status:  1 - Symptomatic, but independent.   Vitals:   12/09/16 1105  BP: 137/68  Pulse: 83  Resp: 16  SpO2: 99%   Filed Weights   12/09/16 1105  Weight: 180 lb 6.4 oz (81.8 kg)    Physical Exam  Constitutional: He is oriented to person, place, and time and well-developed, well-nourished, and in no distress.  HENT:  Head: Normocephalic.  Eyes: Pupils are equal, round, and reactive to light. Conjunctivae are normal. No scleral icterus.  Neck: Normal range of motion. Neck supple.  Cardiovascular: Normal rate, regular rhythm and normal heart sounds.   Pulmonary/Chest: Effort normal and breath sounds normal. No respiratory distress. He has no wheezes.  Abdominal: Soft. Bowel sounds are normal. There is no tenderness. There is no rebound and no guarding.  Musculoskeletal: Normal range of motion. He exhibits no edema.  Lymphadenopathy:  He has no cervical adenopathy.    He has no axillary adenopathy.       Right: No supraclavicular adenopathy present.       Left: No supraclavicular adenopathy present.  Neurological: He is alert and oriented to person, place, and time. No cranial nerve deficit. Gait normal.  Skin: Skin is warm and dry.  Psychiatric: Mood, memory, affect and judgment normal.  Nursing note and vitals reviewed.    LABORATORY DATA:  I have reviewed the labs as listed.  CBC    Component Value Date/Time   WBC 130.4 (HH) 12/09/2016 1028   RBC 4.88 12/09/2016 1028   HGB 15.0 12/09/2016 1028   HCT 46.3 12/09/2016 1028   PLT 143 (L) 12/09/2016 1028   MCV 94.9 12/09/2016 1028   MCH 30.7 12/09/2016 1028   MCHC 32.4 12/09/2016 1028   RDW 14.2 12/09/2016 1028   LYMPHSABS 121.3 (H) 12/09/2016 1028   MONOABS 2.6 (H) 12/09/2016 1028   EOSABS 0.0 12/09/2016 1028   BASOSABS 0.0 12/09/2016 1028   CMP Latest Ref Rng & Units 12/09/2016 10/08/2016 07/09/2016  Glucose 65 - 99 mg/dL 106(H) 110(H) 131(H)  BUN 6 - 20 mg/dL 18 16 16   Creatinine 0.61 - 1.24 mg/dL 0.91 0.95 0.89  Sodium  135 - 145 mmol/L 141 141 139  Potassium 3.5 - 5.1 mmol/L 4.5 4.2 4.1  Chloride 101 - 111 mmol/L 104 110 106  CO2 22 - 32 mmol/L 28 23 28   Calcium 8.9 - 10.3 mg/dL 9.2 9.2 9.1  Total Protein 6.5 - 8.1 g/dL 6.6 6.4(L) 6.5  Total Bilirubin 0.3 - 1.2 mg/dL 0.8 0.8 0.6  Alkaline Phos 38 - 126 U/L 130(H) 120 127(H)  AST 15 - 41 U/L 25 24 24   ALT 17 - 63 U/L 17 15(L) 19   Results for NIRVAAN, FRETT (MRN [id number])  Ref. Range 07/09/2016 09:51  LDH Latest Ref Range: 98 - 192 U/L 137     PENDING LABS:  IgG, IgM, & IgA pending.    DIAGNOSTIC IMAGING:  CT abd/pelvis: 12/07/15     PATHOLOGY:     ASSESSMENT & PLAN:   CLL:   -Diagnosed in 2004; no current active treatment.  -WBCs has been persistently elevated above 100k since at least 2015. WBC is 130k today. No evidence of worsening anemia or thrombocytopenia. No hepatosplenomegaly or new lymphadenopathy on exam today.  -We reviewed the NCCN guidelines for when to start treatment for CLL.  Continue observation at this time.  -Return to cancer center in 4 months with labs for continued monitoring. He knows to call us if he has any new or worsening symptoms and we could see him sooner, if needed.   NCCN guidelines for indication for treatment of CLL are:  A. Eligible for clinical trial  B. Significant disease-related symptoms   1. Fatigue (severe)   2. Night sweats   3. Weight loss   4. Fever without infection  C. Threatened end-organ function  D. Progressive bulky disease (spleen >6cm below costal margin, lymph nodes >10 cm)  E. Progressive anemia  F. Progressive thrombocytopenia.   Hypogammaglobulinemia:  -asymptomatic; no recent infections or fevers. Continue to monitor.   Tobacco use disorder:  -Discussed cutting back and smoke cessation with the patient again, but he states he needs something for his nerves.   Dispo:  -Return to cancer center in 4 months with labs.   Orders Placed This Encounter  Procedures  . CBC  with Differential  Standing Status:   Future    Standing Expiration Date:   12/09/2017  . Comprehensive metabolic panel    Standing Status:   Future    Standing Expiration Date:   12/09/2017     All questions were answered to patient's stated satisfaction. Encouraged patient to call with any new concerns or questions before his next visit to the cancer center and we can certain see him sooner, if needed.    Twana First, MD

## 2016-12-09 NOTE — Progress Notes (Signed)
CRITICAL VALUE ALERT Critical value received:  WBC 130.4 Date of notification:  12/09/2016 Time of notification: 1101 Critical value read back:  Yes.   Nurse who received alert:  Joanne Gavel RN MD notified (1st page):  Dr Talbert Cage

## 2016-12-10 LAB — IGG, IGA, IGM
IGA: 31 mg/dL — AB (ref 61–437)
IGM (IMMUNOGLOBULIN M), SRM: 9 mg/dL — AB (ref 20–172)
IgG (Immunoglobin G), Serum: 422 mg/dL — ABNORMAL LOW (ref 700–1600)

## 2016-12-16 DIAGNOSIS — Z1389 Encounter for screening for other disorder: Secondary | ICD-10-CM | POA: Diagnosis not present

## 2016-12-16 DIAGNOSIS — Z6824 Body mass index (BMI) 24.0-24.9, adult: Secondary | ICD-10-CM | POA: Diagnosis not present

## 2016-12-16 DIAGNOSIS — Z0000 Encounter for general adult medical examination without abnormal findings: Secondary | ICD-10-CM | POA: Diagnosis not present

## 2017-01-10 ENCOUNTER — Ambulatory Visit (HOSPITAL_COMMUNITY): Payer: Medicare Other

## 2017-01-10 ENCOUNTER — Other Ambulatory Visit (HOSPITAL_COMMUNITY): Payer: Medicare Other

## 2017-01-14 DIAGNOSIS — Z23 Encounter for immunization: Secondary | ICD-10-CM | POA: Diagnosis not present

## 2017-04-04 ENCOUNTER — Inpatient Hospital Stay (HOSPITAL_COMMUNITY): Payer: Medicare Other | Attending: Oncology

## 2017-04-04 DIAGNOSIS — C9110 Chronic lymphocytic leukemia of B-cell type not having achieved remission: Secondary | ICD-10-CM | POA: Insufficient documentation

## 2017-04-04 DIAGNOSIS — E785 Hyperlipidemia, unspecified: Secondary | ICD-10-CM | POA: Diagnosis not present

## 2017-04-04 DIAGNOSIS — K219 Gastro-esophageal reflux disease without esophagitis: Secondary | ICD-10-CM | POA: Insufficient documentation

## 2017-04-04 DIAGNOSIS — D801 Nonfamilial hypogammaglobulinemia: Secondary | ICD-10-CM | POA: Insufficient documentation

## 2017-04-04 DIAGNOSIS — Z7982 Long term (current) use of aspirin: Secondary | ICD-10-CM | POA: Insufficient documentation

## 2017-04-04 DIAGNOSIS — F418 Other specified anxiety disorders: Secondary | ICD-10-CM | POA: Diagnosis not present

## 2017-04-04 DIAGNOSIS — I4510 Unspecified right bundle-branch block: Secondary | ICD-10-CM | POA: Insufficient documentation

## 2017-04-04 DIAGNOSIS — F17210 Nicotine dependence, cigarettes, uncomplicated: Secondary | ICD-10-CM | POA: Diagnosis not present

## 2017-04-04 DIAGNOSIS — J449 Chronic obstructive pulmonary disease, unspecified: Secondary | ICD-10-CM | POA: Insufficient documentation

## 2017-04-04 DIAGNOSIS — M1990 Unspecified osteoarthritis, unspecified site: Secondary | ICD-10-CM | POA: Diagnosis not present

## 2017-04-04 DIAGNOSIS — Z79899 Other long term (current) drug therapy: Secondary | ICD-10-CM | POA: Diagnosis not present

## 2017-04-04 LAB — CBC WITH DIFFERENTIAL/PLATELET
BASOS ABS: 0 10*3/uL (ref 0.0–0.1)
Basophils Relative: 0 %
EOS ABS: 0 10*3/uL (ref 0.0–0.7)
Eosinophils Relative: 0 %
HCT: 47.6 % (ref 39.0–52.0)
HEMOGLOBIN: 15.2 g/dL (ref 13.0–17.0)
Lymphocytes Relative: 94 %
Lymphs Abs: 107.8 10*3/uL — ABNORMAL HIGH (ref 0.7–4.0)
MCH: 30.3 pg (ref 26.0–34.0)
MCHC: 31.9 g/dL (ref 30.0–36.0)
MCV: 94.8 fL (ref 78.0–100.0)
MONO ABS: 2.3 10*3/uL — AB (ref 0.1–1.0)
Monocytes Relative: 2 %
NEUTROS PCT: 4 %
Neutro Abs: 4.6 10*3/uL (ref 1.7–7.7)
PLATELETS: 143 10*3/uL — AB (ref 150–400)
RBC: 5.02 MIL/uL (ref 4.22–5.81)
RDW: 13.5 % (ref 11.5–15.5)
WBC: 114.7 10*3/uL — AB (ref 4.0–10.5)

## 2017-04-04 LAB — COMPREHENSIVE METABOLIC PANEL
ALBUMIN: 4.4 g/dL (ref 3.5–5.0)
ALK PHOS: 127 U/L — AB (ref 38–126)
ALT: 19 U/L (ref 17–63)
AST: 25 U/L (ref 15–41)
Anion gap: 9 (ref 5–15)
BUN: 20 mg/dL (ref 6–20)
CHLORIDE: 105 mmol/L (ref 101–111)
CO2: 25 mmol/L (ref 22–32)
CREATININE: 0.93 mg/dL (ref 0.61–1.24)
Calcium: 9.4 mg/dL (ref 8.9–10.3)
GFR calc non Af Amer: >60 mL/min (ref >60)
GLUCOSE: 92 mg/dL (ref 65–99)
Potassium: 4.5 mmol/L (ref 3.5–5.1)
SODIUM: 139 mmol/L (ref 135–145)
Total Bilirubin: 0.5 mg/dL (ref 0.3–1.2)
Total Protein: 6.7 g/dL (ref 6.5–8.1)

## 2017-04-04 NOTE — Progress Notes (Unsigned)
CRITICAL VALUE ALERT Critical value received: WBC 114.7 Date of notification:  04/04/17 Time of notification: 1305 Critical value read back:  Yes.   Nurse who received alert:  A.Darnelle Maffucci LPN MD notified (1st page):  Kirby Crigler PA.  Pt has a follow up with Dr. Lebron Conners on 04/09/17. No action to be taken at this time per Kirby Crigler PA

## 2017-04-09 ENCOUNTER — Inpatient Hospital Stay (HOSPITAL_BASED_OUTPATIENT_CLINIC_OR_DEPARTMENT_OTHER): Payer: Medicare Other | Admitting: Hematology and Oncology

## 2017-04-09 ENCOUNTER — Encounter (HOSPITAL_COMMUNITY): Payer: Self-pay | Admitting: Hematology and Oncology

## 2017-04-09 ENCOUNTER — Other Ambulatory Visit: Payer: Self-pay

## 2017-04-09 VITALS — BP 132/64 | HR 74 | Temp 97.8°F | Resp 16 | Ht 72.75 in | Wt 185.2 lb

## 2017-04-09 DIAGNOSIS — F17210 Nicotine dependence, cigarettes, uncomplicated: Secondary | ICD-10-CM

## 2017-04-09 DIAGNOSIS — K219 Gastro-esophageal reflux disease without esophagitis: Secondary | ICD-10-CM | POA: Diagnosis not present

## 2017-04-09 DIAGNOSIS — C9110 Chronic lymphocytic leukemia of B-cell type not having achieved remission: Secondary | ICD-10-CM | POA: Diagnosis not present

## 2017-04-09 DIAGNOSIS — Z79899 Other long term (current) drug therapy: Secondary | ICD-10-CM | POA: Diagnosis not present

## 2017-04-09 DIAGNOSIS — D801 Nonfamilial hypogammaglobulinemia: Secondary | ICD-10-CM | POA: Diagnosis not present

## 2017-04-09 DIAGNOSIS — E785 Hyperlipidemia, unspecified: Secondary | ICD-10-CM | POA: Diagnosis not present

## 2017-04-09 DIAGNOSIS — J449 Chronic obstructive pulmonary disease, unspecified: Secondary | ICD-10-CM | POA: Diagnosis not present

## 2017-04-11 ENCOUNTER — Ambulatory Visit (HOSPITAL_COMMUNITY): Payer: Medicare Other

## 2017-04-20 NOTE — Progress Notes (Signed)
[facility] Follow-up Visit:  Assessment: Chronic lymphocytic leukemia ([place]) 67 y.o. male with chronic lymphocytic leukemia, stage I with associated hypogammaglobulinemia, but no other significant symptoms.  Lab work obtained today demonstrate stable lymphocytosis with absolute lymphocyte count of 108, no anemia and minimal thrombocytopenia.  Patient remains otherwise stable with no new indications to initiate therapy at this time.  Plan: -- Continue observation, return to our clinic in 6 months with labs and possible IVIG infusion. --Next disease assessment with CT of the neck, chest, abdomen, pelvis in September 2019.  Voice recognition software was used and creation of this note. Despite my best effort at editing the text, some misspelling/errors may have occurred.  Orders Placed This Encounter  Procedures  . CBC with Differential    Standing Status:   Future    Standing Expiration Date:   04/09/2018  . Comprehensive metabolic panel    Standing Status:   Future    Standing Expiration Date:   04/09/2018  . Lactate dehydrogenase    Standing Status:   Future    Standing Expiration Date:   04/09/2018  . IgG, IgA, IgM    Standing Status:   Future    Standing Expiration Date:   04/09/2018    Cancer Staging No matching staging information was found for the patient.  All questions were answered. . The patient knows to call the clinic with any problems, questions or concerns.  This note was electronically signed.    History of Presenting Illness Alfred Brown 67 y.o. presenting to the [facility] for clinical monitoring of chronic lymphocytic leukemia, Rai stage I associated with hypogammaglobulinemia.  Patient returns to the clinic for continued lab monitoring.  He denies any new symptoms.  No fevers, chills, night sweats.  No unexpected weight loss or weight gain.  He does complain of fatigue and easy bruisability, but no swelling in the neck, armpits, or groin.  No  abdominal discomfort, early satiety, or diarrhea.  No interval infections..  Oncological/hematological History:  No history exists.    Medical History: Past Medical History:  Diagnosis Date  . Aortic valve disorder    Mild insufficiency  . Chronic lymphocytic leukemia ([place])    01/2012: WBC-90.2, H&H-15.1/45.3, platelets-185 03/31/12: Verified with Dr. Tressie Stalker that no precautions or modification of medical regime are required prior to orthopaedic surgery.   . CLL (chronic lymphocytic leukemia) ([facility])   . CMV (cytomegalovirus) ([place])   . COPD (chronic obstructive pulmonary disease) ([facility])   . Depression with anxiety   . DJD (degenerative joint disease)   . GERD (gastroesophageal reflux disease)   . Hyperlipidemia   . Lipoma    left chest wall  . Palpitations 2004   PVCs; borderline stress nuclear in 2004, negative in 2007; Echo 2007; AV-sclerotic, very mild AI  . Right bundle branch block    + left posterior fascicular block  . Tobacco abuse    80 pack years    Surgical History: Past Surgical History:  Procedure Laterality Date  . COLONOSCOPY  01/2012   Negative screening study  . GANGLION CYST EXCISION  1997   left wrist  . ROTATOR CUFF REPAIR  1997   right  . SHOULDER ARTHROSCOPY WITH SUBACROMIAL DECOMPRESSION  04/09/2012   Procedure: SHOULDER ARTHROSCOPY WITH SUBACROMIAL DECOMPRESSION;  Surgeon: Ninetta Lights, MD;  Location: [facility];  Service: Orthopedics;  Laterality: Left;  LEFT SHOULDER ARTHROSCOPY, SUBACROMIAL DECOMPRESSION, PARTIAL ACROMIOPLASTY WITH CORACOACROMIAL RELEASE, DISTAL CLAVICULECTOMY WITH ROTATOR CUFF  REPAIR, DEBRIDEMENT OF LABRUM    Family History: Family History  Problem Relation Age of Onset  . Stroke Mother   . Heart attack Father   . Cancer Sister        colon  . Colon cancer Sister   . Cancer Brother        2 brothers died with lung cancer    Social History: Social History   Socioeconomic History  . Marital status:  Married    Spouse name: Not on file  . Number of children: Not on file  . Years of education: Not on file  . Highest education level: Not on file  Social Needs  . Financial resource strain: Not on file  . Food insecurity - worry: Not on file  . Food insecurity - inability: Not on file  . Transportation needs - medical: Not on file  . Transportation needs - non-medical: Not on file  Occupational History  . Not on file  Tobacco Use  . Smoking status: Current Every Day Smoker    Packs/day: 1.00    Years: 45.00    Pack years: 45.00    Types: Cigarettes  . Smokeless tobacco: Never Used  . Tobacco comment: Consumption decreased approximately 2010 to one pack per day  Substance and Sexual Activity  . Alcohol use: No    Alcohol/week: 0.0 oz  . Drug use: No  . Sexual activity: Not on file  Other Topics Concern  . Not on file  Social History Narrative  . Not on file    Allergies: No Known Allergies  Medications:  Current Outpatient Medications  Medication Sig Dispense Refill  . acyclovir (ZOVIRAX) 800 MG tablet Take 800 mg by mouth 5 (five) times daily as needed (outbreak).     [name] albuterol (PROVENTIL HFA;VENTOLIN HFA) 108 (90 BASE) MCG/ACT inhaler Inhale 2 puffs into the lungs every 6 (six) hours as needed for wheezing or shortness of breath.     . ALPRAZolam (XANAX) 1 MG tablet Take 1 mg by mouth 3 (three) times daily as needed for anxiety.     [name] aspirin 325 MG tablet Take 325 mg by mouth daily.      [name] buPROPion (WELLBUTRIN SR) 150 MG 12 hr tablet Take 150 mg by mouth daily.     . calcium carbonate (OS-CAL) 600 MG TABS Take 600 mg by mouth daily.      . clotrimazole-betamethasone (LOTRISONE) cream Apply 1 application topically daily as needed. Affected area    . Diphenhydramine-PE-APAP 12.5-5-325 MG TABS Take 1 tablet by mouth as needed (sinus). sinus    . fish oil-omega-3 fatty acids 1000 MG capsule Take 2 g by mouth daily.      . fluticasone (FLONASE) 50 MCG/ACT nasal spray  Place 2 sprays into the nose daily.      [name] HYDROcodone-acetaminophen (NORCO/VICODIN) 5-325 MG per tablet Take 1 tablet by mouth every 6 (six) hours as needed for moderate pain.     . Loratadine 10 MG CAPS Take 10 mg by mouth daily.     [name] lovastatin (MEVACOR) 20 MG tablet Take 20 mg by mouth daily.    . Multiple Vitamin (MULTIVITAMIN) tablet Take 1 tablet by mouth daily.      . naproxen (NAPROSYN) 500 MG tablet Take 500 mg by mouth 2 (two) times daily as needed for mild pain or moderate pain.     . niacin (NIASPAN) 500 MG CR tablet Take 500 mg by mouth daily.  No current facility-administered medications for this visit.     Review of Systems: Review of Systems  Constitutional: Positive for fatigue.  Hematological: Bruises/bleeds easily.     PHYSICAL EXAMINATION Blood pressure 132/64, pulse 74, temperature 97.8 F (36.6 C), temperature source Oral, resp. rate 16, height 6' 0.75" (1.848 m), weight 185 lb 3.2 oz (84 kg), SpO2 99 %.  ECOG PERFORMANCE STATUS: 1 - Symptomatic but completely ambulatory  Physical Exam  Constitutional: He is oriented to person, place, and time and well-developed, well-nourished, and in no distress. No distress.  HENT:  Head: Normocephalic and atraumatic.  Mouth/Throat: Oropharynx is clear and moist. No oropharyngeal exudate.  Eyes: Conjunctivae are normal. Pupils are equal, round, and reactive to light. No scleral icterus.  Neck: Normal range of motion. Neck supple. No thyromegaly present.  Cardiovascular: Normal rate, regular rhythm and normal heart sounds.  No murmur heard. Pulmonary/Chest: Effort normal and breath sounds normal. No respiratory distress. He has no wheezes. He has no rales.  Abdominal: Soft. Bowel sounds are normal. He exhibits no distension and no mass. There is no tenderness. There is no guarding.  Musculoskeletal: He exhibits no edema.  Lymphadenopathy:    He has no cervical adenopathy.  Neurological: He is alert and oriented to  person, place, and time. He has normal reflexes. No cranial nerve deficit.  Skin: Skin is warm and dry. No rash noted. He is not diaphoretic. No erythema.     LABORATORY DATA: I have personally reviewed the data as listed: Lab on 04/04/2017  Component Date Value Ref Range Status  . WBC 04/04/2017 114.7* 4.0 - 10.5 K/uL Final   Comment: REPEATED TO VERIFY CRITICAL RESULT CALLED TO, READ BACK BY AND VERIFIED WITH: TRAVIS,A. AT 1304 ON 04/04/2017 BY EVA   . RBC 04/04/2017 5.02  4.22 - 5.81 MIL/uL Final  . Hemoglobin 04/04/2017 15.2  13.0 - 17.0 g/dL Final  . HCT 04/04/2017 47.6  39.0 - 52.0 % Final  . MCV 04/04/2017 94.8  78.0 - 100.0 fL Final  . MCH 04/04/2017 30.3  26.0 - 34.0 pg Final  . MCHC 04/04/2017 31.9  30.0 - 36.0 g/dL Final  . RDW 04/04/2017 13.5  11.5 - 15.5 % Final  . Platelets 04/04/2017 143* 150 - 400 K/uL Final  . Neutrophils Relative % 04/04/2017 4  % Final  . Lymphocytes Relative 04/04/2017 94  % Final  . Monocytes Relative 04/04/2017 2  % Final  . Eosinophils Relative 04/04/2017 0  % Final  . Basophils Relative 04/04/2017 0  % Final  . Neutro Abs 04/04/2017 4.6  1.7 - 7.7 K/uL Final  . Lymphs Abs 04/04/2017 107.8* 0.7 - 4.0 K/uL Final  . Monocytes Absolute 04/04/2017 2.3* 0.1 - 1.0 K/uL Final  . Eosinophils Absolute 04/04/2017 0.0  0.0 - 0.7 K/uL Final  . Basophils Absolute 04/04/2017 0.0  0.0 - 0.1 K/uL Final  . WBC Morphology 04/04/2017 ABSOLUTE LYMPHOCYTOSIS   Final   Comment: ATYPICAL LYMPHOCYTES SMUDGE CELLS   . Sodium 04/04/2017 139  135 - 145 mmol/L Final  . Potassium 04/04/2017 4.5  3.5 - 5.1 mmol/L Final  . Chloride 04/04/2017 105  101 - 111 mmol/L Final  . CO2 04/04/2017 25  22 - 32 mmol/L Final  . Glucose, Bld 04/04/2017 92  65 - 99 mg/dL Final  . BUN 04/04/2017 20  6 - 20 mg/dL Final  . Creatinine, Ser 04/04/2017 0.93  0.61 - 1.24 mg/dL Final  . Calcium 04/04/2017 9.4  8.9 -  10.3 mg/dL Final  . Total Protein 04/04/2017 6.7  6.5 - 8.1 g/dL Final   . Albumin 04/04/2017 4.4  3.5 - 5.0 g/dL Final  . AST 04/04/2017 25  15 - 41 U/L Final  . ALT 04/04/2017 19  17 - 63 U/L Final  . Alkaline Phosphatase 04/04/2017 127* 38 - 126 U/L Final  . Total Bilirubin 04/04/2017 0.5  0.3 - 1.2 mg/dL Final  . GFR calc non Af Amer 04/04/2017 >60  >60 mL/min Final  . GFR calc Af Amer 04/04/2017 >60  >60 mL/min Final   Comment: (NOTE) The eGFR has been calculated using the CKD EPI equation. This calculation has not been validated in all clinical situations. eGFR's persistently <60 mL/min signify possible Chronic Kidney Disease.   Georgiann Hahn gap 04/04/2017 9  5 - 15 Final       Ardath Sax, MD

## 2017-04-20 NOTE — Assessment & Plan Note (Signed)
67 y.o. male with chronic lymphocytic leukemia, stage I with associated hypogammaglobulinemia, but no other significant symptoms.  Lab work obtained today demonstrate stable lymphocytosis with absolute lymphocyte count of 108, no anemia and minimal thrombocytopenia.  Patient remains otherwise stable with no new indications to initiate therapy at this time.  Plan: -- Continue observation, return to our clinic in 6 months with labs and possible IVIG infusion. --Next disease assessment with CT of the neck, chest, abdomen, pelvis in September 2019.

## 2017-07-17 DIAGNOSIS — J329 Chronic sinusitis, unspecified: Secondary | ICD-10-CM | POA: Diagnosis not present

## 2017-07-17 DIAGNOSIS — Z6825 Body mass index (BMI) 25.0-25.9, adult: Secondary | ICD-10-CM | POA: Diagnosis not present

## 2017-07-17 DIAGNOSIS — J449 Chronic obstructive pulmonary disease, unspecified: Secondary | ICD-10-CM | POA: Diagnosis not present

## 2017-07-17 DIAGNOSIS — E663 Overweight: Secondary | ICD-10-CM | POA: Diagnosis not present

## 2017-07-17 DIAGNOSIS — E7849 Other hyperlipidemia: Secondary | ICD-10-CM | POA: Diagnosis not present

## 2017-07-17 DIAGNOSIS — I10 Essential (primary) hypertension: Secondary | ICD-10-CM | POA: Diagnosis not present

## 2017-09-11 DIAGNOSIS — C9190 Lymphoid leukemia, unspecified not having achieved remission: Secondary | ICD-10-CM | POA: Diagnosis not present

## 2017-09-11 DIAGNOSIS — M1991 Primary osteoarthritis, unspecified site: Secondary | ICD-10-CM | POA: Diagnosis not present

## 2017-09-11 DIAGNOSIS — M549 Dorsalgia, unspecified: Secondary | ICD-10-CM | POA: Diagnosis not present

## 2017-09-11 DIAGNOSIS — Z6825 Body mass index (BMI) 25.0-25.9, adult: Secondary | ICD-10-CM | POA: Diagnosis not present

## 2017-09-11 DIAGNOSIS — R0789 Other chest pain: Secondary | ICD-10-CM | POA: Diagnosis not present

## 2017-09-11 DIAGNOSIS — E782 Mixed hyperlipidemia: Secondary | ICD-10-CM | POA: Diagnosis not present

## 2017-09-11 DIAGNOSIS — E663 Overweight: Secondary | ICD-10-CM | POA: Diagnosis not present

## 2017-09-11 DIAGNOSIS — I10 Essential (primary) hypertension: Secondary | ICD-10-CM | POA: Diagnosis not present

## 2017-09-12 DIAGNOSIS — Z79899 Other long term (current) drug therapy: Secondary | ICD-10-CM | POA: Diagnosis not present

## 2017-09-12 DIAGNOSIS — R55 Syncope and collapse: Secondary | ICD-10-CM | POA: Diagnosis not present

## 2017-09-12 DIAGNOSIS — Z125 Encounter for screening for malignant neoplasm of prostate: Secondary | ICD-10-CM | POA: Diagnosis not present

## 2017-10-06 ENCOUNTER — Other Ambulatory Visit (HOSPITAL_COMMUNITY): Payer: Self-pay | Admitting: *Deleted

## 2017-10-06 DIAGNOSIS — C9110 Chronic lymphocytic leukemia of B-cell type not having achieved remission: Secondary | ICD-10-CM

## 2017-10-07 ENCOUNTER — Inpatient Hospital Stay (HOSPITAL_COMMUNITY): Payer: Medicare Other | Attending: Hematology

## 2017-10-07 DIAGNOSIS — C9190 Lymphoid leukemia, unspecified not having achieved remission: Secondary | ICD-10-CM | POA: Insufficient documentation

## 2017-10-07 DIAGNOSIS — C9110 Chronic lymphocytic leukemia of B-cell type not having achieved remission: Secondary | ICD-10-CM

## 2017-10-07 LAB — COMPREHENSIVE METABOLIC PANEL
ALK PHOS: 122 U/L (ref 38–126)
ALT: 19 U/L (ref 0–44)
AST: 21 U/L (ref 15–41)
Albumin: 4.2 g/dL (ref 3.5–5.0)
Anion gap: 6 (ref 5–15)
BILIRUBIN TOTAL: 0.8 mg/dL (ref 0.3–1.2)
BUN: 24 mg/dL — AB (ref 8–23)
CALCIUM: 9 mg/dL (ref 8.9–10.3)
CHLORIDE: 111 mmol/L (ref 98–111)
CO2: 25 mmol/L (ref 22–32)
CREATININE: 0.95 mg/dL (ref 0.61–1.24)
Glucose, Bld: 139 mg/dL — ABNORMAL HIGH (ref 70–99)
Potassium: 4.2 mmol/L (ref 3.5–5.1)
Sodium: 142 mmol/L (ref 135–145)
Total Protein: 6.2 g/dL — ABNORMAL LOW (ref 6.5–8.1)

## 2017-10-07 LAB — CBC WITH DIFFERENTIAL/PLATELET
BASOS ABS: 0 10*3/uL (ref 0.0–0.1)
Basophils Relative: 0 %
EOS ABS: 0 10*3/uL (ref 0.0–0.7)
Eosinophils Relative: 0 %
HCT: 45.9 % (ref 39.0–52.0)
Hemoglobin: 14.4 g/dL (ref 13.0–17.0)
LYMPHS PCT: 94 %
Lymphs Abs: 133.3 10*3/uL — ABNORMAL HIGH (ref 0.7–4.0)
MCH: 30.3 pg (ref 26.0–34.0)
MCHC: 31.4 g/dL (ref 30.0–36.0)
MCV: 96.6 fL (ref 78.0–100.0)
Monocytes Absolute: 2.8 10*3/uL — ABNORMAL HIGH (ref 0.1–1.0)
Monocytes Relative: 2 %
NEUTROS ABS: 5.7 10*3/uL (ref 1.7–7.7)
NEUTROS PCT: 4 %
PLATELETS: 130 10*3/uL — AB (ref 150–400)
RBC: 4.75 MIL/uL (ref 4.22–5.81)
RDW: 14.4 % (ref 11.5–15.5)
WBC: 141.8 10*3/uL — AB (ref 4.0–10.5)

## 2017-10-07 LAB — LACTATE DEHYDROGENASE: LDH: 126 U/L (ref 98–192)

## 2017-10-07 NOTE — Progress Notes (Unsigned)
CRITICAL VALUE STICKER  CRITICAL VALUE:  Wbc 141.8  RECEIVER (on-site recipient of call): Marlissa Emerick, rn  DATE & TIME NOTIFIED: 10/07/2017 at 1208  MESSENGER (representative from lab): margaret  MD NOTIFIED: Delton Coombes  TIME OF NOTIFICATION:  6701  RESPONSE:

## 2017-10-08 LAB — IGG, IGA, IGM
IGG (IMMUNOGLOBIN G), SERUM: 383 mg/dL — AB (ref 700–1600)
IgA: 29 mg/dL — ABNORMAL LOW (ref 61–437)
IgM (Immunoglobulin M), Srm: 8 mg/dL — ABNORMAL LOW (ref 20–172)

## 2017-10-14 ENCOUNTER — Ambulatory Visit (HOSPITAL_COMMUNITY): Payer: Medicare Other | Admitting: Hematology

## 2017-10-14 ENCOUNTER — Encounter (HOSPITAL_COMMUNITY): Payer: Self-pay | Admitting: Hematology

## 2017-10-14 ENCOUNTER — Other Ambulatory Visit: Payer: Self-pay

## 2017-10-14 ENCOUNTER — Inpatient Hospital Stay (HOSPITAL_BASED_OUTPATIENT_CLINIC_OR_DEPARTMENT_OTHER): Payer: Medicare Other | Admitting: Hematology

## 2017-10-14 VITALS — BP 141/76 | HR 73 | Temp 98.1°F | Resp 18 | Wt 181.4 lb

## 2017-10-14 DIAGNOSIS — C9190 Lymphoid leukemia, unspecified not having achieved remission: Secondary | ICD-10-CM

## 2017-10-14 DIAGNOSIS — C9110 Chronic lymphocytic leukemia of B-cell type not having achieved remission: Secondary | ICD-10-CM

## 2017-10-14 NOTE — Assessment & Plan Note (Addendum)
1.  Stage II CLL by Rai system: - Initially diagnosed around 2010, on watchful observation. -Denies any B symptoms including fevers, night sweats, weight loss or recurrent infections. -Reviewed the labs with the patient and his wife.  White count is 141 with hemoglobin of 14.4 and platelet count of 130.  LDH was 126.  Last CT scan of the abdomen and pelvis in 2017 showed lymphadenopathy, 1 to 2 cm in size.  Spleen size was normal. - Subcentimeter lymph nodes palpable in the neck and bilateral axilla.  Spleen tip palpable on deep inspiration. -We will continue to monitor at 6 monthly intervals.  No indication for treatment at this time.  Will do CT scans if clinical condition dictates.  2.  Hypogammaglobulinemia: -This is from underlying CLL.  Trough immunoglobulin levels mineral globulin IgG level was 383.  It is mostly between 300-400. -Since there is no recurrent infections, IVIG is not recommended at this time.

## 2017-10-14 NOTE — Patient Instructions (Signed)
[facility] at [facility] Discharge Instructions  Today you saw Dr. K.   Thank you for choosing [facility] at [facility] to provide your oncology and hematology care.  To afford each patient quality time with our provider, please arrive at least 15 minutes before your scheduled appointment time.   If you have a lab appointment with the Cancer Center please come in thru the  Main Entrance and check in at the main information desk  You need to re-schedule your appointment should you arrive 10 or more minutes late.  We strive to give you quality time with our providers, and arriving late affects you and other patients whose appointments are after yours.  Also, if you no show three or more times for appointments you may be dismissed from the clinic at the providers discretion.     Again, thank you for choosing [facility].  Our hope is that these requests will decrease the amount of time that you wait before being seen by our physicians.       _____________________________________________________________  Should you have questions after your visit to [facility], please contact our office at [phone] between the hours of 8:30 a.m. and 4:30 p.m.  Voicemails left after 4:30 p.m. will not be returned until the following business day.  For prescription refill requests, have your pharmacy contact our office.       Resources For Cancer Patients and their Caregivers ? American Cancer Society: Can assist with transportation, wigs, general needs, runs Look Good Feel Better.        1-888-227-6333 ? Cancer Care: Provides financial assistance, online support groups, medication/co-pay assistance.  1-800-813-HOPE (4673) ? [facility] Assists [place] cancer patients and their families through emotional , educational and financial support.  336-427-4357 ? [place] DSS Where to apply for food  stamps, Medicaid and utility assistance. 336-342-1394 ? [facility]: Transportation to medical appointments. 336-347-2287 ? Social Security Administration: May apply for disability if have a Stage IV cancer. 336-342-7796 1-800-772-1213 ? [place] Aging, Disability and Transit Services: Assists with nutrition, care and transit needs. 336-349-2343  Cancer Center Support Programs:   > Cancer Support Group  2nd Tuesday of the month 1pm-2pm, Journey Room   > Creative Journey  3rd Tuesday of the month 1130am-1pm, Journey Room    

## 2017-10-14 NOTE — Progress Notes (Signed)
Alfred Brown, [place] [id number]   CLINIC:  Medical Oncology/Hematology  PCP:  [facility], [place] [address] [phone]   REASON FOR VISIT:  Follow-up for chronic lymphocytic leukemia and hypogammaglobulinemia   CURRENT THERAPY: observation   INTERVAL HISTORY:  [name] 67 y.o. male returns for routine follow-up for chronic lymphocytic leukemia. Patient is here today with his wife. Patient states he is doing well. Denies any nausea, vomiting, or diarrhea. Denies any new pains. Denies any fatigue or weakness. No fevers, chills or night sweats. No unexplained weight loss.     REVIEW OF SYSTEMS:  Review of Systems  All other systems reviewed and are negative.    PAST MEDICAL/SURGICAL HISTORY:  Past Medical History:  Diagnosis Date  . Aortic valve disorder    Mild insufficiency  . Chronic lymphocytic leukemia ([place])    01/2012: WBC-90.2, H&H-15.1/45.3, platelets-185 03/31/12: Verified with Dr. Tressie Stalker that no precautions or modification of medical regime are required prior to orthopaedic surgery.   . CLL (chronic lymphocytic leukemia) ([place])   . CMV (cytomegalovirus) ([place])   . COPD (chronic obstructive pulmonary disease) ([place])   . Depression with anxiety   . DJD (degenerative joint disease)   . GERD (gastroesophageal reflux disease)   . Hyperlipidemia   . Lipoma    left chest wall  . Palpitations 2004   PVCs; borderline stress nuclear in 2004, negative in 2007; Echo 2007; AV-sclerotic, very mild AI  . Right bundle branch block    + left posterior fascicular block  . Tobacco abuse    80 pack years   Past Surgical History:  Procedure Laterality Date  . COLONOSCOPY  01/2012   Negative screening study  . GANGLION CYST EXCISION  1997   left wrist  . ROTATOR CUFF REPAIR  1997   right  . SHOULDER ARTHROSCOPY WITH SUBACROMIAL DECOMPRESSION  04/09/2012   Procedure: SHOULDER ARTHROSCOPY WITH  SUBACROMIAL DECOMPRESSION;  Surgeon: Ninetta Lights, MD;  Location: [facility];  Service: Orthopedics;  Laterality: Left;  LEFT SHOULDER ARTHROSCOPY, SUBACROMIAL DECOMPRESSION, PARTIAL ACROMIOPLASTY WITH CORACOACROMIAL RELEASE, DISTAL CLAVICULECTOMY WITH ROTATOR CUFF REPAIR, DEBRIDEMENT OF LABRUM     SOCIAL HISTORY:  Social History   Socioeconomic History  . Marital status: Married    Spouse name: Not on file  . Number of children: Not on file  . Years of education: Not on file  . Highest education level: Not on file  Occupational History  . Not on file  Social Needs  . Financial resource strain: Not on file  . Food insecurity:    Worry: Not on file    Inability: Not on file  . Transportation needs:    Medical: Not on file    Non-medical: Not on file  Tobacco Use  . Smoking status: Current Every Day Smoker    Packs/day: 1.00    Years: 45.00    Pack years: 45.00    Types: Cigarettes  . Smokeless tobacco: Never Used  . Tobacco comment: Consumption decreased approximately 2010 to one pack per day  Substance and Sexual Activity  . Alcohol use: No    Alcohol/week: 0.0 oz  . Drug use: No  . Sexual activity: Not on file  Lifestyle  . Physical activity:    Days per week: Not on file    Minutes per session: Not on file  . Stress: Not on file  Relationships  . Social connections:  Talks on phone: Not on file    Gets together: Not on file    Attends religious service: Not on file    Active member of club or organization: Not on file    Attends meetings of clubs or organizations: Not on file    Relationship status: Not on file  . Intimate partner violence:    Fear of current or ex partner: Not on file    Emotionally abused: Not on file    Physically abused: Not on file    Forced sexual activity: Not on file  Other Topics Concern  . Not on file  Social History Narrative  . Not on file    FAMILY HISTORY:  Family History  Problem Relation Age of Onset    . Stroke Mother   . Heart attack Father   . Cancer Sister        colon  . Colon cancer Sister   . Cancer Brother        2 brothers died with lung cancer    CURRENT MEDICATIONS:  Outpatient Encounter Medications as of 10/14/2017  Medication Sig  . acyclovir (ZOVIRAX) 800 MG tablet Take 800 mg by mouth 5 (five) times daily as needed (outbreak).   [name] albuterol (PROVENTIL HFA;VENTOLIN HFA) 108 (90 BASE) MCG/ACT inhaler Inhale 2 puffs into the lungs every 6 (six) hours as needed for wheezing or shortness of breath.   . ALPRAZolam (XANAX) 1 MG tablet Take 1 mg by mouth 3 (three) times daily as needed for anxiety.   [name] aspirin 325 MG tablet Take 325 mg by mouth daily.    [name] buPROPion (WELLBUTRIN SR) 150 MG 12 hr tablet Take 150 mg by mouth daily.   . calcium carbonate (OS-CAL) 600 MG TABS Take 600 mg by mouth daily.    . celecoxib (CELEBREX) 100 MG capsule Take 100 mg by mouth 2 (two) times daily.  . clotrimazole-betamethasone (LOTRISONE) cream Apply 1 application topically daily as needed. Affected area  . Diphenhydramine-PE-APAP 12.5-5-325 MG TABS Take 1 tablet by mouth as needed (sinus). sinus  . fish oil-omega-3 fatty acids 1000 MG capsule Take 2 g by mouth daily.    . fluticasone (FLONASE) 50 MCG/ACT nasal spray Place 2 sprays into the nose daily.    [name] HYDROcodone-acetaminophen (NORCO/VICODIN) 5-325 MG per tablet Take 1 tablet by mouth every 6 (six) hours as needed for moderate pain.   . Loratadine 10 MG CAPS Take 10 mg by mouth daily.   [name] lovastatin (MEVACOR) 20 MG tablet Take 20 mg by mouth daily.  . Multiple Vitamin (MULTIVITAMIN) tablet Take 1 tablet by mouth daily.    . niacin (NIASPAN) 500 MG CR tablet Take 500 mg by mouth daily.  . [DISCONTINUED] naproxen (NAPROSYN) 500 MG tablet Take 500 mg by mouth 2 (two) times daily as needed for mild pain or moderate pain.    No facility-administered encounter medications on file as of 10/14/2017.     ALLERGIES:  No Known  Allergies   PHYSICAL EXAM:  ECOG Performance status: 0  Vitals:   10/14/17 1545  BP: (!) 141/76  Pulse: 73  Resp: 18  Temp: 98.1 F (36.7 C)  SpO2: 100%   Filed Weights   10/14/17 1545  Weight: 181 lb 6.4 oz (82.3 kg)    Physical Exam HEENT: Subcentimeter lymph nodes in the bilateral neck. Lymphadenopathy: Bilateral subcentimeter axillary lymphadenopathy. Abdomen: No hepatomegaly.  Spleen tip palpable on deep inspiration. Skin: No rashes. Extremities: No edema or cyanosis.  LABORATORY DATA:  I have reviewed the labs as listed.  CBC    Component Value Date/Time   WBC 141.8 (HH) 10/07/2017 1059   RBC 4.75 10/07/2017 1059   HGB 14.4 10/07/2017 1059   HCT 45.9 10/07/2017 1059   PLT 130 (L) 10/07/2017 1059   MCV 96.6 10/07/2017 1059   MCH 30.3 10/07/2017 1059   MCHC 31.4 10/07/2017 1059   RDW 14.4 10/07/2017 1059   LYMPHSABS 133.3 (H) 10/07/2017 1059   MONOABS 2.8 (H) 10/07/2017 1059   EOSABS 0.0 10/07/2017 1059   BASOSABS 0.0 10/07/2017 1059   CMP Latest Ref Rng & Units 10/07/2017 04/04/2017 12/09/2016  Glucose 70 - 99 mg/dL 139(H) 92 106(H)  BUN 8 - 23 mg/dL 24(H) 20 18  Creatinine 0.61 - 1.24 mg/dL 0.95 0.93 0.91  Sodium 135 - 145 mmol/L 142 139 141  Potassium 3.5 - 5.1 mmol/L 4.2 4.5 4.5  Chloride 98 - 111 mmol/L 111 105 104  CO2 22 - 32 mmol/L 25 25 28   Calcium 8.9 - 10.3 mg/dL 9.0 9.4 9.2  Total Protein 6.5 - 8.1 g/dL 6.2(L) 6.7 6.6  Total Bilirubin 0.3 - 1.2 mg/dL 0.8 0.5 0.8  Alkaline Phos 38 - 126 U/L 122 127(H) 130(H)  AST 15 - 41 U/L 21 25 25   ALT 0 - 44 U/L 19 19 17         ASSESSMENT & PLAN:   Chronic lymphocytic leukemia (HCC) 1.  Stage II CLL by Rai system: - Initially diagnosed around 2010, on watchful observation. -Denies any B symptoms including fevers, night sweats, weight loss or recurrent infections. -Reviewed the labs with the patient and his wife.  White count is 141 with hemoglobin of 14.4 and platelet count of 130.  LDH was 126.   Last CT scan of the abdomen and pelvis in 2017 showed lymphadenopathy, 1 to 2 cm in size.  Spleen size was normal. - Subcentimeter lymph nodes palpable in the neck and bilateral axilla.  Spleen tip palpable on deep inspiration. -We will continue to monitor at 6 monthly intervals.  No indication for treatment at this time.  Will do CT scans if clinical condition dictates.  2.  Hypogammaglobulinemia: -This is from underlying CLL.  Trough immunoglobulin levels mineral globulin IgG level was 383.  It is mostly between 300-400. -Since there is no recurrent infections, IVIG is not recommended at this time.      Orders placed this encounter:  Orders Placed This Encounter  Procedures  . Lactate dehydrogenase  . CBC with Differential/Platelet  . Comprehensive metabolic panel  . IgG, IgA, IgM      Derek Jack, MD [place] [phone]

## 2017-10-22 ENCOUNTER — Encounter: Payer: Self-pay | Admitting: Cardiology

## 2017-10-22 ENCOUNTER — Ambulatory Visit (INDEPENDENT_AMBULATORY_CARE_PROVIDER_SITE_OTHER): Payer: Medicare Other | Admitting: Cardiology

## 2017-10-22 ENCOUNTER — Other Ambulatory Visit: Payer: Self-pay

## 2017-10-22 VITALS — BP 154/71 | HR 86 | Ht 72.0 in | Wt 183.0 lb

## 2017-10-22 DIAGNOSIS — Q231 Congenital insufficiency of aortic valve: Secondary | ICD-10-CM | POA: Diagnosis not present

## 2017-10-22 DIAGNOSIS — R002 Palpitations: Secondary | ICD-10-CM | POA: Diagnosis not present

## 2017-10-22 NOTE — Patient Instructions (Signed)
Your physician wants you to follow-up in: 1 YEAR WITH DR BRANCH You will receive a reminder letter in the mail two months in advance. If you don't receive a letter, please call our office to schedule the follow-up appointment.  Your physician recommends that you continue on your current medications as directed. Please refer to the Current Medication list given to you today.  Your physician has requested that you have an echocardiogram. Echocardiography is a painless test that uses sound waves to create images of your heart. It provides your doctor with information about the size and shape of your heart and how well your heart's chambers and valves are working. This procedure takes approximately one hour. There are no restrictions for this procedure.  Thank you for choosing [facility]!!    

## 2017-10-22 NOTE — Progress Notes (Signed)
Clinical Summary Alfred Brown is a 67 y.o.male seen today for follow up of the following medical problems.    1. Papitations - long history of symtpoms.  - denies any recent symptoms.  - nonspecific episode of blurry visition while driving, had no other associated symptoms.    2. Bicuspid aortic valve - somewhat questionable on prior imaging - echo without significant valvular dysfunction - CTA chest 12/2004 without significant aortopathy.   - no recent chest pain/sob/syncope.   3. CLL - followed at cancer center   Past Medical History:  Diagnosis Date  . Aortic valve disorder    Mild insufficiency  . Chronic lymphocytic leukemia ([place])    01/2012: WBC-90.2, H&H-15.1/45.3, platelets-185 03/31/12: Verified with Dr. Tressie Brown that no precautions or modification of medical regime are required prior to orthopaedic surgery.   . CLL (chronic lymphocytic leukemia) ([place])   . CMV (cytomegalovirus) ([place])   . COPD (chronic obstructive pulmonary disease) ([place])   . Depression with anxiety   . DJD (degenerative joint disease)   . GERD (gastroesophageal reflux disease)   . Hyperlipidemia   . Lipoma    left chest wall  . Palpitations 2004   PVCs; borderline stress nuclear in 2004, negative in 2007; Echo 2007; AV-sclerotic, very mild AI  . Right bundle Alfred Brown block    + left posterior fascicular block  . Tobacco abuse    80 pack years     No Known Allergies   Current Outpatient Medications  Medication Sig Dispense Refill  . acyclovir (ZOVIRAX) 800 MG tablet Take 800 mg by mouth 5 (five) times daily as needed (outbreak).     [name] albuterol (PROVENTIL HFA;VENTOLIN HFA) 108 (90 BASE) MCG/ACT inhaler Inhale 2 puffs into the lungs every 6 (six) hours as needed for wheezing or shortness of breath.     . ALPRAZolam (XANAX) 1 MG tablet Take 1 mg by mouth 3 (three) times daily as needed for anxiety.     [name] aspirin 325 MG tablet Take 325 mg by mouth daily.      [name] buPROPion (WELLBUTRIN  SR) 150 MG 12 hr tablet Take 150 mg by mouth daily.     . calcium carbonate (OS-CAL) 600 MG TABS Take 600 mg by mouth daily.      . celecoxib (CELEBREX) 100 MG capsule Take 100 mg by mouth 2 (two) times daily.    . clotrimazole-betamethasone (LOTRISONE) cream Apply 1 application topically daily as needed. Affected area    . Diphenhydramine-PE-APAP 12.5-5-325 MG TABS Take 1 tablet by mouth as needed (sinus). sinus    . fish oil-omega-3 fatty acids 1000 MG capsule Take 2 g by mouth daily.      . fluticasone (FLONASE) 50 MCG/ACT nasal spray Place 2 sprays into the nose daily.      [name] HYDROcodone-acetaminophen (NORCO/VICODIN) 5-325 MG per tablet Take 1 tablet by mouth every 6 (six) hours as needed for moderate pain.     . Loratadine 10 MG CAPS Take 10 mg by mouth daily.     [name] lovastatin (MEVACOR) 20 MG tablet Take 20 mg by mouth daily.    . Multiple Vitamin (MULTIVITAMIN) tablet Take 1 tablet by mouth daily.      . niacin (NIASPAN) 500 MG CR tablet Take 500 mg by mouth daily.     No current facility-administered medications for this visit.      Past Surgical History:  Procedure Laterality Date  . COLONOSCOPY  01/2012   Negative screening study  .  GANGLION CYST EXCISION  1997   left wrist  . ROTATOR CUFF REPAIR  1997   right  . SHOULDER ARTHROSCOPY WITH SUBACROMIAL DECOMPRESSION  04/09/2012   Procedure: SHOULDER ARTHROSCOPY WITH SUBACROMIAL DECOMPRESSION;  Surgeon: Alfred Lights, MD;  Location: [facility];  Service: Orthopedics;  Laterality: Left;  LEFT SHOULDER ARTHROSCOPY, SUBACROMIAL DECOMPRESSION, PARTIAL ACROMIOPLASTY WITH CORACOACROMIAL RELEASE, DISTAL CLAVICULECTOMY WITH ROTATOR CUFF REPAIR, DEBRIDEMENT OF LABRUM     No Known Allergies    Family History  Problem Relation Age of Onset  . Stroke Mother   . Heart attack Father   . Cancer Sister        colon  . Colon cancer Sister   . Cancer Brother        2 brothers died with lung cancer     Social  History Alfred Brown reports that he has been smoking cigarettes.  He has a 45.00 pack-year smoking history. He has never used smokeless tobacco. Alfred Brown reports that he does not drink alcohol.   Review of Systems CONSTITUTIONAL: No weight loss, fever, chills, weakness or fatigue.  HEENT: Eyes: No visual loss, blurred vision, double vision or yellow sclerae.No hearing loss, sneezing, congestion, runny nose or sore throat.  SKIN: No rash or itching.  CARDIOVASCULAR: per hpi RESPIRATORY: No shortness of breath, cough or sputum.  GASTROINTESTINAL: No anorexia, nausea, vomiting or diarrhea. No abdominal pain or blood.  GENITOURINARY: No burning on urination, no polyuria NEUROLOGICAL: No headache, dizziness, syncope, paralysis, ataxia, numbness or tingling in the extremities. No change in bowel or bladder control.  MUSCULOSKELETAL: No muscle, back pain, joint pain or stiffness.  LYMPHATICS: No enlarged nodes. No history of splenectomy.  PSYCHIATRIC: No history of depression or anxiety.  ENDOCRINOLOGIC: No reports of sweating, cold or heat intolerance. No polyuria or polydipsia.  [name]   Physical Examination Vitals:   10/22/17 1033  BP: (!) 154/71  Pulse: 86  SpO2: 98%   Vitals:   10/22/17 1033  Weight: 183 lb (83 kg)  Height: 6' (1.829 m)    Gen: resting comfortably, no acute distress HEENT: no scleral icterus, pupils equal round and reactive, no palptable cervical adenopathy,  CV: RRR, 2/6 systolic murmur rusb, no jvd Resp: Clear to auscultation bilaterally GI: abdomen is soft, non-tender, non-distended, normal bowel sounds, no hepatosplenomegaly MSK: extremities are warm, no edema.  Skin: warm, no rash Neuro:  no focal deficits Psych: appropriate affect   Diagnostic Studies 09/2005 GXT STRESS DATA: Patient underwent exercise stress testing on the [facility] Protocol. Baseline EKG showed sinus rhythm at 59 beats per minute, right bundle Alfred Brown block. Baseline blood pressure  was 132/70.  The patient exercised for 7 minutes, 52 seconds to a peak heart rate of 151 which was 90% predicted maximal. Peak blood pressure is 182/68. Patient stopped because of fatigue, experienced no chest pain. EKG showed no significant ST changes to suggest ischemia.  IMPRESSION: Exercise stress test. Clinically and electrically negative for ischemia. Myoview scan pending.   09/2005 echo . Technically adequate echocardiographic study. 2. Normal left atrium, right atrium and right ventricle. 3. Trileaflet aortic valve with mild to moderate sclerosis of the leaflets;  reasonably good leaflet excursion; very mild insufficiency and trivial,  if any, stenosis by Doppler. 4. Normal diameter of the proximal ascending aorta with mild calcification  of the wall. 5. Mild mitral valve thickening; mild annular calcification. 6. Normal tricuspid and pulmonic valves; normal proximal pulmonary artery. 7. Normal left ventricular size; borderline hypertrophy; normal  regional  and global function. 8. Comparison with prior study of January 31, 2003: No significant  interval change.    Assessment and Plan  1. Palpitaitons - previous monitor with just occasional PACs, no significant arrhythmias - no significant symptoms. Nonspecific episode of blurry visition while driving without any associated symptoms, would not pursue any further testing at this time from cardiac standpoing.   2. Bicuspid aortic valve - no significant valvular dysfunction from previous testing.  Previous CTA without significaint aortopathy. We will continue to monitor - repeat echo for continued surveillance.      F/u 1 year   Arnoldo Lenis, M.D., F.A.C.C.

## 2017-11-06 ENCOUNTER — Other Ambulatory Visit: Payer: Self-pay | Admitting: Cardiology

## 2017-11-06 ENCOUNTER — Other Ambulatory Visit: Payer: Self-pay

## 2017-11-06 ENCOUNTER — Ambulatory Visit (INDEPENDENT_AMBULATORY_CARE_PROVIDER_SITE_OTHER): Payer: Medicare Other

## 2017-11-06 DIAGNOSIS — I359 Nonrheumatic aortic valve disorder, unspecified: Secondary | ICD-10-CM

## 2017-11-10 ENCOUNTER — Telehealth: Payer: Self-pay | Admitting: Cardiology

## 2017-11-10 NOTE — Telephone Encounter (Signed)
Returning someones call 

## 2017-11-10 NOTE — Telephone Encounter (Signed)
Notes recorded by Laurine Blazer, LPN on 8/34/6219 at 4:71 PM EDT Patient notified. Copy to pmd. ------  Notes recorded by Acquanetta Chain, LPN on 2/52/7129 at 2:90 PM EDT [facility] ------  Notes recorded by Arnoldo Lenis, MD on 11/10/2017 at 3:59 PM EDT Echo looks good, aortic valve is just mild to moderately thickened. Not enough to be of concern, we will continue to monitor  Zandra Abts MD

## 2017-12-23 DIAGNOSIS — E782 Mixed hyperlipidemia: Secondary | ICD-10-CM | POA: Diagnosis not present

## 2017-12-23 DIAGNOSIS — M1991 Primary osteoarthritis, unspecified site: Secondary | ICD-10-CM | POA: Diagnosis not present

## 2017-12-23 DIAGNOSIS — Z0001 Encounter for general adult medical examination with abnormal findings: Secondary | ICD-10-CM | POA: Diagnosis not present

## 2017-12-23 DIAGNOSIS — C9190 Lymphoid leukemia, unspecified not having achieved remission: Secondary | ICD-10-CM | POA: Diagnosis not present

## 2017-12-23 DIAGNOSIS — Z23 Encounter for immunization: Secondary | ICD-10-CM | POA: Diagnosis not present

## 2017-12-23 DIAGNOSIS — Z1389 Encounter for screening for other disorder: Secondary | ICD-10-CM | POA: Diagnosis not present

## 2017-12-23 DIAGNOSIS — J449 Chronic obstructive pulmonary disease, unspecified: Secondary | ICD-10-CM | POA: Diagnosis not present

## 2017-12-23 DIAGNOSIS — E669 Obesity, unspecified: Secondary | ICD-10-CM | POA: Diagnosis not present

## 2017-12-23 DIAGNOSIS — F419 Anxiety disorder, unspecified: Secondary | ICD-10-CM | POA: Diagnosis not present

## 2017-12-23 DIAGNOSIS — Z6825 Body mass index (BMI) 25.0-25.9, adult: Secondary | ICD-10-CM | POA: Diagnosis not present

## 2017-12-23 DIAGNOSIS — I10 Essential (primary) hypertension: Secondary | ICD-10-CM | POA: Diagnosis not present

## 2017-12-23 DIAGNOSIS — K219 Gastro-esophageal reflux disease without esophagitis: Secondary | ICD-10-CM | POA: Diagnosis not present

## 2018-01-22 DIAGNOSIS — H6993 Unspecified Eustachian tube disorder, bilateral: Secondary | ICD-10-CM | POA: Diagnosis not present

## 2018-01-22 DIAGNOSIS — E663 Overweight: Secondary | ICD-10-CM | POA: Diagnosis not present

## 2018-01-22 DIAGNOSIS — J019 Acute sinusitis, unspecified: Secondary | ICD-10-CM | POA: Diagnosis not present

## 2018-01-22 DIAGNOSIS — Z6825 Body mass index (BMI) 25.0-25.9, adult: Secondary | ICD-10-CM | POA: Diagnosis not present

## 2018-04-16 ENCOUNTER — Inpatient Hospital Stay (HOSPITAL_COMMUNITY): Payer: Medicare Other | Attending: Hematology

## 2018-04-16 DIAGNOSIS — D801 Nonfamilial hypogammaglobulinemia: Secondary | ICD-10-CM | POA: Insufficient documentation

## 2018-04-16 DIAGNOSIS — F17210 Nicotine dependence, cigarettes, uncomplicated: Secondary | ICD-10-CM | POA: Diagnosis not present

## 2018-04-16 DIAGNOSIS — Z7982 Long term (current) use of aspirin: Secondary | ICD-10-CM | POA: Insufficient documentation

## 2018-04-16 DIAGNOSIS — L308 Other specified dermatitis: Secondary | ICD-10-CM | POA: Diagnosis not present

## 2018-04-16 DIAGNOSIS — Z79899 Other long term (current) drug therapy: Secondary | ICD-10-CM | POA: Diagnosis not present

## 2018-04-16 DIAGNOSIS — C9110 Chronic lymphocytic leukemia of B-cell type not having achieved remission: Secondary | ICD-10-CM | POA: Diagnosis not present

## 2018-04-16 LAB — COMPREHENSIVE METABOLIC PANEL
ALK PHOS: 116 U/L (ref 38–126)
ALT: 18 U/L (ref 0–44)
AST: 22 U/L (ref 15–41)
Albumin: 4.3 g/dL (ref 3.5–5.0)
Anion gap: 8 (ref 5–15)
BUN: 19 mg/dL (ref 8–23)
CO2: 23 mmol/L (ref 22–32)
Calcium: 9 mg/dL (ref 8.9–10.3)
Chloride: 110 mmol/L (ref 98–111)
Creatinine, Ser: 0.92 mg/dL (ref 0.61–1.24)
GFR calc Af Amer: >60 mL/min (ref >60)
GFR calc non Af Amer: >60 mL/min (ref >60)
Glucose, Bld: 139 mg/dL — ABNORMAL HIGH (ref 70–99)
Potassium: 4.2 mmol/L (ref 3.5–5.1)
Sodium: 141 mmol/L (ref 135–145)
Total Bilirubin: 0.3 mg/dL (ref 0.3–1.2)
Total Protein: 6.3 g/dL — ABNORMAL LOW (ref 6.5–8.1)

## 2018-04-16 LAB — CBC WITH DIFFERENTIAL/PLATELET
Abs Immature Granulocytes: 0.25 10*3/uL — ABNORMAL HIGH (ref 0.00–0.07)
BASOS ABS: 0.1 10*3/uL (ref 0.0–0.1)
Basophils Relative: 0 %
EOS ABS: 0.4 10*3/uL (ref 0.0–0.5)
EOS PCT: 0 %
HEMATOCRIT: 46.5 % (ref 39.0–52.0)
HEMOGLOBIN: 14.4 g/dL (ref 13.0–17.0)
Immature Granulocytes: 0 %
Lymphocytes Relative: 92 %
Lymphs Abs: 112.5 10*3/uL — ABNORMAL HIGH (ref 0.7–4.0)
MCH: 29.6 pg (ref 26.0–34.0)
MCHC: 31 g/dL (ref 30.0–36.0)
MCV: 95.5 fL (ref 80.0–100.0)
Monocytes Absolute: 3.1 10*3/uL — ABNORMAL HIGH (ref 0.1–1.0)
Monocytes Relative: 3 %
NRBC: 0 % (ref 0.0–0.2)
Neutro Abs: 5.7 10*3/uL (ref 1.7–7.7)
Neutrophils Relative %: 5 %
Platelets: 135 10*3/uL — ABNORMAL LOW (ref 150–400)
RBC: 4.87 MIL/uL (ref 4.22–5.81)
RDW: 14.1 % (ref 11.5–15.5)
WBC: 122 10*3/uL — AB (ref 4.0–10.5)

## 2018-04-16 LAB — LACTATE DEHYDROGENASE: LDH: 137 U/L (ref 98–192)

## 2018-04-16 NOTE — Progress Notes (Unsigned)
CRITICAL VALUE ALERT  Critical Value:  WBC 122  Date & Time Notied:  04/16/2018 at [place]  Provider Notified: Dr. Delton Coombes  Orders Received/Actions taken: n/a

## 2018-04-17 LAB — IGG, IGA, IGM
IgA: 29 mg/dL — ABNORMAL LOW (ref 61–437)
IgG (Immunoglobin G), Serum: 398 mg/dL — ABNORMAL LOW (ref 700–1600)
IgM (Immunoglobulin M), Srm: 8 mg/dL — ABNORMAL LOW (ref 20–172)

## 2018-04-23 ENCOUNTER — Other Ambulatory Visit: Payer: Self-pay

## 2018-04-23 ENCOUNTER — Inpatient Hospital Stay (HOSPITAL_COMMUNITY): Payer: Medicare Other | Attending: Hematology | Admitting: Hematology

## 2018-04-23 ENCOUNTER — Encounter (HOSPITAL_COMMUNITY): Payer: Self-pay | Admitting: Hematology

## 2018-04-23 VITALS — BP 132/66 | HR 88 | Temp 98.3°F | Resp 16 | Wt 187.1 lb

## 2018-04-23 DIAGNOSIS — F17210 Nicotine dependence, cigarettes, uncomplicated: Secondary | ICD-10-CM | POA: Diagnosis not present

## 2018-04-23 DIAGNOSIS — Z7982 Long term (current) use of aspirin: Secondary | ICD-10-CM

## 2018-04-23 DIAGNOSIS — C9110 Chronic lymphocytic leukemia of B-cell type not having achieved remission: Secondary | ICD-10-CM | POA: Diagnosis not present

## 2018-04-23 DIAGNOSIS — Z79899 Other long term (current) drug therapy: Secondary | ICD-10-CM | POA: Diagnosis not present

## 2018-04-23 DIAGNOSIS — D801 Nonfamilial hypogammaglobulinemia: Secondary | ICD-10-CM

## 2018-04-23 NOTE — Assessment & Plan Note (Signed)
1.  Stage II CLL by Rai system: - Initially diagnosed around 2010, on watchful observation. -Denies any fevers, night sweats or weight loss in the last 6 months.  Denies any infections.  Denies any hospitalizations.   -Today's physical examination shows subcentimeter lymph nodes palpable in the posterior triangles of the neck, bilateral axilla.  Spleen tip palpable on deep inspiration. -We reviewed his blood count today.  White count is 125.  He has mild thrombocytopenia with platelet count around 130.  This is also stable.  Hemoglobin was normal. -No indication for treatment at this time.  We will continue to monitor him at 6 monthly intervals.  We will do CT scans if clinical condition dictates.  2.  Hypogammaglobulinemia: -This is from underlying CLL.  His trough immunoglobulins are remaining around 3 50-400. -He does not have any recurrent infections.  IVIG is not recommended at this time.

## 2018-04-23 NOTE — Patient Instructions (Signed)
[facility] at [facility] Discharge Instructions     Thank you for choosing [facility] at [facility] to provide your oncology and hematology care.  To afford each patient quality time with our provider, please arrive at least 15 minutes before your scheduled appointment time.   If you have a lab appointment with the Cancer Center please come in thru the  Main Entrance and check in at the main information desk  You need to re-schedule your appointment should you arrive 10 or more minutes late.  We strive to give you quality time with our providers, and arriving late affects you and other patients whose appointments are after yours.  Also, if you no show three or more times for appointments you may be dismissed from the clinic at the providers discretion.     Again, thank you for choosing [facility].  Our hope is that these requests will decrease the amount of time that you wait before being seen by our physicians.       _____________________________________________________________  Should you have questions after your visit to  Cancer Center, please contact our office at [phone] between the hours of 8:00 a.m. and 4:30 p.m.  Voicemails left after 4:00 p.m. will not be returned until the following business day.  For prescription refill requests, have your pharmacy contact our office and allow 72 hours.    Cancer Center Support Programs:   > Cancer Support Group  2nd Tuesday of the month 1pm-2pm, Journey Room    

## 2018-04-23 NOTE — Progress Notes (Signed)
[place], [place] [address]   CLINIC:  Medical Oncology/Hematology  PCP:  [facility], [place] [address] [phone]   REASON FOR VISIT: Follow-up for chronic lymphocytic leukemia and hypogammaglobulinemia   CURRENT THERAPY: observation  INTERVAL HISTORY:  Mr. Nickson 68 y.o. male returns for routine follow-up for chronic lymphocytic leukemia and hypogammaglobulinemia. He is here today with his wife and he reports he is doing well. He recently had a lesion cut of his arm by his Dermatologist and is came back good. Denies any night sweats, chills, or unexplained weight loss. Denies any nausea, vomiting, or diarrhea. Denies any new pains. Had not noticed any recent bleeding such as epistaxis, hematuria or hematochezia. Denies recent chest pain on exertion, shortness of breath on minimal exertion, pre-syncopal episodes, or palpitations. Denies any numbness or tingling in hands or feet. Denies any recent fevers, infections, or recent hospitalizations. Patient reports appetite at 100% and energy level at 50%.   REVIEW OF SYSTEMS:  Review of Systems  All other systems reviewed and are negative.    PAST MEDICAL/SURGICAL HISTORY:  Past Medical History:  Diagnosis Date  . Aortic valve disorder    Mild insufficiency  . Chronic lymphocytic leukemia ([facility])    01/2012: WBC-90.2, H&H-15.1/45.3, platelets-185 03/31/12: Verified with Dr. Tressie Stalker that no precautions or modification of medical regime are required prior to orthopaedic surgery.   . CLL (chronic lymphocytic leukemia) ([place])   . CMV (cytomegalovirus) ([facility])   . COPD (chronic obstructive pulmonary disease) ([facility])   . Depression with anxiety   . DJD (degenerative joint disease)   . GERD (gastroesophageal reflux disease)   . Hyperlipidemia   . Lipoma    left chest wall  . Palpitations 2004   PVCs; borderline stress nuclear in 2004, negative in 2007; Echo 2007;  AV-sclerotic, very mild AI  . Right bundle branch block    + left posterior fascicular block  . Tobacco abuse    80 pack years   Past Surgical History:  Procedure Laterality Date  . COLONOSCOPY  01/2012   Negative screening study  . GANGLION CYST EXCISION  1997   left wrist  . ROTATOR CUFF REPAIR  1997   right  . SHOULDER ARTHROSCOPY WITH SUBACROMIAL DECOMPRESSION  04/09/2012   Procedure: SHOULDER ARTHROSCOPY WITH SUBACROMIAL DECOMPRESSION;  Surgeon: Ninetta Lights, MD;  Location: [facility];  Service: Orthopedics;  Laterality: Left;  LEFT SHOULDER ARTHROSCOPY, SUBACROMIAL DECOMPRESSION, PARTIAL ACROMIOPLASTY WITH CORACOACROMIAL RELEASE, DISTAL CLAVICULECTOMY WITH ROTATOR CUFF REPAIR, DEBRIDEMENT OF LABRUM     SOCIAL HISTORY:  Social History   Socioeconomic History  . Marital status: Married    Spouse name: Not on file  . Number of children: Not on file  . Years of education: Not on file  . Highest education level: Not on file  Occupational History  . Not on file  Social Needs  . Financial resource strain: Not on file  . Food insecurity:    Worry: Not on file    Inability: Not on file  . Transportation needs:    Medical: Not on file    Non-medical: Not on file  Tobacco Use  . Smoking status: Current Every Day Smoker    Packs/day: 1.00    Years: 45.00    Pack years: 45.00    Types: Cigarettes  . Smokeless tobacco: Never Used  . Tobacco comment: Consumption decreased approximately 2010 to one pack per day  Substance  and Sexual Activity  . Alcohol use: No    Alcohol/week: 0.0 standard drinks  . Drug use: No  . Sexual activity: Not on file  Lifestyle  . Physical activity:    Days per week: Not on file    Minutes per session: Not on file  . Stress: Not on file  Relationships  . Social connections:    Talks on phone: Not on file    Gets together: Not on file    Attends religious service: Not on file    Active member of club or organization: Not on  file    Attends meetings of clubs or organizations: Not on file    Relationship status: Not on file  . Intimate partner violence:    Fear of current or ex partner: Not on file    Emotionally abused: Not on file    Physically abused: Not on file    Forced sexual activity: Not on file  Other Topics Concern  . Not on file  Social History Narrative  . Not on file    FAMILY HISTORY:  Family History  Problem Relation Age of Onset  . Stroke Mother   . Heart attack Father   . Cancer Sister        colon  . Colon cancer Sister   . Cancer Brother        2 brothers died with lung cancer    CURRENT MEDICATIONS:  Outpatient Encounter Medications as of 04/23/2018  Medication Sig  . ALPRAZolam (XANAX) 1 MG tablet Take 1 mg by mouth 3 (three) times daily as needed for anxiety.   [name] aspirin 325 MG tablet Take 325 mg by mouth daily.    . betamethasone dipropionate (DIPROLENE) 0.05 % cream   . buPROPion (WELLBUTRIN SR) 150 MG 12 hr tablet Take 150 mg by mouth daily.   . calcium carbonate (OS-CAL) 600 MG TABS Take 600 mg by mouth daily.    . celecoxib (CELEBREX) 100 MG capsule Take 100 mg by mouth 2 (two) times daily.  . clotrimazole-betamethasone (LOTRISONE) cream Apply 1 application topically daily as needed. Affected area  . fish oil-omega-3 fatty acids 1000 MG capsule Take 2 g by mouth daily.    . fluticasone (FLONASE) 50 MCG/ACT nasal spray Place 2 sprays into the nose daily.    . Loratadine 10 MG CAPS Take 10 mg by mouth daily.   [name] lovastatin (MEVACOR) 20 MG tablet Take 20 mg by mouth daily.  . Multiple Vitamin (MULTIVITAMIN) tablet Take 1 tablet by mouth daily.    . niacin (NIASPAN) 500 MG CR tablet Take 500 mg by mouth daily.  [name] acyclovir (ZOVIRAX) 800 MG tablet Take 800 mg by mouth 5 (five) times daily as needed (outbreak).   [name] albuterol (PROVENTIL HFA;VENTOLIN HFA) 108 (90 BASE) MCG/ACT inhaler Inhale 2 puffs into the lungs every 6 (six) hours as needed for wheezing or shortness of  breath.   . Diphenhydramine-PE-APAP 12.5-5-325 MG TABS Take 1 tablet by mouth as needed (sinus). sinus  . HYDROcodone-acetaminophen (NORCO/VICODIN) 5-325 MG per tablet Take 1 tablet by mouth every 6 (six) hours as needed for moderate pain.    No facility-administered encounter medications on file as of 04/23/2018.     ALLERGIES:  No Known Allergies   PHYSICAL EXAM:  ECOG Performance status: 1  Vitals:   04/23/18 1000  BP: 132/66  Pulse: 88  Resp: 16  Temp: 98.3 F (36.8 C)  SpO2: 99%   Filed Weights  04/23/18 1000  Weight: 187 lb 2 oz (84.9 kg)    Physical Exam Constitutional:      Appearance: Normal appearance. He is normal weight.  Neck:     Musculoskeletal: Normal range of motion and neck supple.  Cardiovascular:     Rate and Rhythm: Normal rate and regular rhythm.     Heart sounds: Normal heart sounds.  Pulmonary:     Effort: Pulmonary effort is normal.     Breath sounds: Normal breath sounds.  Abdominal:     General: Abdomen is flat.     Palpations: Abdomen is soft.  Musculoskeletal: Normal range of motion.  Skin:    General: Skin is warm and dry.  Neurological:     Mental Status: He is alert and oriented to person, place, and time. Mental status is at baseline.  Psychiatric:        Mood and Affect: Mood normal.        Behavior: Behavior normal.        Thought Content: Thought content normal.        Judgment: Judgment normal.   Subcentimeter lymph node in the posterior triangle of both sides of the neck.  Subcentimeter lymph nodes in the bilateral axillary region.  Spleen tip palpable on deep inspiration.   LABORATORY DATA:  I have reviewed the labs as listed.  CBC    Component Value Date/Time   WBC 122.0 (HH) 04/16/2018 1029   RBC 4.87 04/16/2018 1029   HGB 14.4 04/16/2018 1029   HCT 46.5 04/16/2018 1029   PLT 135 (L) 04/16/2018 1029   MCV 95.5 04/16/2018 1029   MCH 29.6 04/16/2018 1029   MCHC 31.0 04/16/2018 1029   RDW 14.1 04/16/2018 1029     LYMPHSABS 112.5 (H) 04/16/2018 1029   MONOABS 3.1 (H) 04/16/2018 1029   EOSABS 0.4 04/16/2018 1029   BASOSABS 0.1 04/16/2018 1029   CMP Latest Ref Rng & Units 04/16/2018 10/07/2017 04/04/2017  Glucose 70 - 99 mg/dL 139(H) 139(H) 92  BUN 8 - 23 mg/dL 19 24(H) 20  Creatinine 0.61 - 1.24 mg/dL 0.92 0.95 0.93  Sodium 135 - 145 mmol/L 141 142 139  Potassium 3.5 - 5.1 mmol/L 4.2 4.2 4.5  Chloride 98 - 111 mmol/L 110 111 105  CO2 22 - 32 mmol/L 23 25 25   Calcium 8.9 - 10.3 mg/dL 9.0 9.0 9.4  Total Protein 6.5 - 8.1 g/dL 6.3(L) 6.2(L) 6.7  Total Bilirubin 0.3 - 1.2 mg/dL 0.3 0.8 0.5  Alkaline Phos 38 - 126 U/L 116 122 127(H)  AST 15 - 41 U/L 22 21 25   ALT 0 - 44 U/L 18 19 19        DIAGNOSTIC IMAGING:  I have independently reviewed the scans and discussed with the patient.   I have reviewed Francene Finders, NP's note and agree with the documentation.  I personally performed a face-to-face visit, made revisions and my assessment and plan is as follows.    ASSESSMENT & PLAN:   Chronic lymphocytic leukemia ([place]) 1.  Stage II CLL by Rai system: - Initially diagnosed around 2010, on watchful observation. -Denies any fevers, night sweats or weight loss in the last 6 months.  Denies any infections.  Denies any hospitalizations.   -Today's physical examination shows subcentimeter lymph nodes palpable in the posterior triangles of the neck, bilateral axilla.  Spleen tip palpable on deep inspiration. -We reviewed his blood count today.  White count is 125.  He has mild thrombocytopenia with platelet count  around 130.  This is also stable.  Hemoglobin was normal. -No indication for treatment at this time.  We will continue to monitor him at 6 monthly intervals.  We will do CT scans if clinical condition dictates.  2.  Hypogammaglobulinemia: -This is from underlying CLL.  His trough immunoglobulins are remaining around 3 50-400. -He does not have any recurrent infections.  IVIG is not recommended  at this time.      Orders placed this encounter:  Orders Placed This Encounter  Procedures  . IgG, IgA, IgM  . CBC with Differential/Platelet  . Comprehensive metabolic panel  . Lactate dehydrogenase      Derek Jack, MD [facility] [phone]

## 2018-06-04 DIAGNOSIS — F419 Anxiety disorder, unspecified: Secondary | ICD-10-CM | POA: Diagnosis not present

## 2018-06-04 DIAGNOSIS — Z6825 Body mass index (BMI) 25.0-25.9, adult: Secondary | ICD-10-CM | POA: Diagnosis not present

## 2018-06-04 DIAGNOSIS — B001 Herpesviral vesicular dermatitis: Secondary | ICD-10-CM | POA: Diagnosis not present

## 2018-06-04 DIAGNOSIS — J329 Chronic sinusitis, unspecified: Secondary | ICD-10-CM | POA: Diagnosis not present

## 2018-10-23 ENCOUNTER — Inpatient Hospital Stay (HOSPITAL_COMMUNITY): Payer: Medicare Other | Attending: Hematology

## 2018-10-23 ENCOUNTER — Other Ambulatory Visit: Payer: Self-pay

## 2018-10-23 DIAGNOSIS — Z79899 Other long term (current) drug therapy: Secondary | ICD-10-CM | POA: Insufficient documentation

## 2018-10-23 DIAGNOSIS — C9110 Chronic lymphocytic leukemia of B-cell type not having achieved remission: Secondary | ICD-10-CM | POA: Insufficient documentation

## 2018-10-23 DIAGNOSIS — D801 Nonfamilial hypogammaglobulinemia: Secondary | ICD-10-CM | POA: Insufficient documentation

## 2018-10-23 DIAGNOSIS — Z7982 Long term (current) use of aspirin: Secondary | ICD-10-CM | POA: Diagnosis not present

## 2018-10-23 DIAGNOSIS — F17210 Nicotine dependence, cigarettes, uncomplicated: Secondary | ICD-10-CM | POA: Insufficient documentation

## 2018-10-23 LAB — CBC WITH DIFFERENTIAL/PLATELET
Abs Immature Granulocytes: 0.23 10*3/uL — ABNORMAL HIGH (ref 0.00–0.07)
Basophils Absolute: 0.3 10*3/uL — ABNORMAL HIGH (ref 0.0–0.1)
Basophils Relative: 0 %
Eosinophils Absolute: 0.3 10*3/uL (ref 0.0–0.5)
Eosinophils Relative: 0 %
HCT: 46.8 % (ref 39.0–52.0)
Hemoglobin: 14.7 g/dL (ref 13.0–17.0)
Immature Granulocytes: 0 %
Lymphocytes Relative: 91 %
Lymphs Abs: 106.1 10*3/uL — ABNORMAL HIGH (ref 0.7–4.0)
MCH: 30.2 pg (ref 26.0–34.0)
MCHC: 31.4 g/dL (ref 30.0–36.0)
MCV: 96.3 fL (ref 80.0–100.0)
Monocytes Absolute: 5 10*3/uL — ABNORMAL HIGH (ref 0.1–1.0)
Monocytes Relative: 4 %
Neutro Abs: 5.5 10*3/uL (ref 1.7–7.7)
Neutrophils Relative %: 5 %
Platelets: 136 10*3/uL — ABNORMAL LOW (ref 150–400)
RBC: 4.86 MIL/uL (ref 4.22–5.81)
RDW: 13.2 % (ref 11.5–15.5)
WBC: 117.4 10*3/uL (ref 4.0–10.5)
nRBC: 0 % (ref 0.0–0.2)

## 2018-10-23 LAB — LACTATE DEHYDROGENASE: LDH: 135 U/L (ref 98–192)

## 2018-10-23 LAB — COMPREHENSIVE METABOLIC PANEL
ALT: 16 U/L (ref 0–44)
AST: 20 U/L (ref 15–41)
Albumin: 4.5 g/dL (ref 3.5–5.0)
Alkaline Phosphatase: 123 U/L (ref 38–126)
Anion gap: 8 (ref 5–15)
BUN: 19 mg/dL (ref 8–23)
CO2: 26 mmol/L (ref 22–32)
Calcium: 9 mg/dL (ref 8.9–10.3)
Chloride: 107 mmol/L (ref 98–111)
Creatinine, Ser: 1.1 mg/dL (ref 0.61–1.24)
GFR calc Af Amer: >60 mL/min (ref >60)
GFR calc non Af Amer: >60 mL/min (ref >60)
Glucose, Bld: 118 mg/dL — ABNORMAL HIGH (ref 70–99)
Potassium: 4.2 mmol/L (ref 3.5–5.1)
Sodium: 141 mmol/L (ref 135–145)
Total Bilirubin: 0.8 mg/dL (ref 0.3–1.2)
Total Protein: 6.4 g/dL — ABNORMAL LOW (ref 6.5–8.1)

## 2018-10-24 LAB — IGG, IGA, IGM
IgA: 27 mg/dL — ABNORMAL LOW (ref 61–437)
IgG (Immunoglobin G), Serum: 406 mg/dL — ABNORMAL LOW (ref 603–1613)
IgM (Immunoglobulin M), Srm: 8 mg/dL — ABNORMAL LOW (ref 20–172)

## 2018-10-30 ENCOUNTER — Other Ambulatory Visit: Payer: Self-pay

## 2018-10-30 ENCOUNTER — Inpatient Hospital Stay (HOSPITAL_BASED_OUTPATIENT_CLINIC_OR_DEPARTMENT_OTHER): Payer: Medicare Other | Admitting: Nurse Practitioner

## 2018-10-30 DIAGNOSIS — C9110 Chronic lymphocytic leukemia of B-cell type not having achieved remission: Secondary | ICD-10-CM

## 2018-10-30 NOTE — Progress Notes (Signed)
Virtual Visit via Telephone Note  I connected with Alfred Brown on 10/30/18 at 11:10 AM EDT by telephone and verified that I am speaking with the correct person using two identifiers.   I discussed the limitations, risks, security and privacy concerns of performing an evaluation and management service by telephone and the availability of in person appointments. I also discussed with the patient that there may be a patient responsible charge related to this service. The patient expressed understanding and agreed to proceed.   History of Present Illness: He reports he is feeling great since his last visit.  He denies any fevers chills night sweats or unexplained weight loss. Denies any nausea, vomiting, or diarrhea. Denies any new pains. Had not noticed any recent bleeding such as epistaxis, hematuria or hematochezia. Denies recent chest pain on exertion, shortness of breath on minimal exertion, pre-syncopal episodes, or palpitations. Denies any numbness or tingling in hands or feet. Denies any recent fevers, infections, or recent hospitalizations. Patient reports appetite at 75% and energy level at 75%.  He is eating well maintain his weight this time.    Observations/Objective: -He was alert and oriented and in no acute distress. -Physical assessment deferred due to telephone visit.  Assessment and Plan: 1.  Stage II CLL by Rai system: -Initially diagnosed around 2010, he is on observation now. - He denies any fevers, night sweats, chills, or weight loss in the past 6 months.  Denies any recent infections or hospitalizations. - Physical examination deferred today due to telephone visit. - Labs on 10/23/2018 showed his WBC is 117.4, platelets 136, hemoglobin 14.7. -No indication for treatment at this time.  We will continue to monitor him every 6 months with labs.  We will only do CT scans if clinical condition dictates. -We will see him back in 6 months with  labs.  2.Hypogammaglobulinemia: -This is from underlying CLL. -His trough immunoglobulins are remaining around 350-400. -Labs on 10/23/2018 showed his IgG 406. -He does not have any recurrent infections. -IVIG is not recommended at this time.  Follow Up Instructions: -he will follow-up in 6 months with repeat labs.   I discussed the assessment and treatment plan with the patient. The patient was provided an opportunity to ask questions and all were answered. The patient agreed with the plan and demonstrated an understanding of the instructions.   The patient was advised to call back or seek an in-person evaluation if the symptoms worsen or if the condition fails to improve as anticipated.  I provided 20 minutes of non-face-to-face time during this encounter. Video chat is unavailable for this patient at this time.  Glennie Isle, NP-C

## 2018-10-30 NOTE — Assessment & Plan Note (Signed)
1.  Stage II CLL by Rai system: -Initially diagnosed around 2010, he is on observation now. - He denies any fevers, night sweats, chills, or weight loss in the past 6 months.  Denies any recent infections or hospitalizations. - Physical examination deferred today due to telephone visit. - Labs on 10/23/2018 showed his WBC is 117.4, platelets 136, hemoglobin 14.7. -No indication for treatment at this time.  We will continue to monitor him every 6 months with labs.  We will only do CT scans if clinical condition dictates. -We will see him back in 6 months with labs.  2.Hypogammaglobulinemia: -This is from underlying CLL. -His trough immunoglobulins are remaining around 350-400. -Labs on 10/23/2018 showed his IgG 406. -He does not have any recurrent infections. -IVIG is not recommended at this time.

## 2018-10-30 NOTE — Patient Instructions (Signed)
[facility] at [facility] Discharge Instructions     Thank you for choosing [facility] at [facility] to provide your oncology and hematology care.  To afford each patient quality time with our provider, please arrive at least 15 minutes before your scheduled appointment time.   If you have a lab appointment with the Cancer Center please come in thru the  Main Entrance and check in at the main information desk  You need to re-schedule your appointment should you arrive 10 or more minutes late.  We strive to give you quality time with our providers, and arriving late affects you and other patients whose appointments are after yours.  Also, if you no show three or more times for appointments you may be dismissed from the clinic at the providers discretion.     Again, thank you for choosing [facility].  Our hope is that these requests will decrease the amount of time that you wait before being seen by our physicians.       _____________________________________________________________  Should you have questions after your visit to [facility], please contact our office at [phone] between the hours of 8:00 a.m. and 4:30 p.m.  Voicemails left after 4:00 p.m. will not be returned until the following business day.  For prescription refill requests, have your pharmacy contact our office and allow 72 hours.    Cancer Center Support Programs:   > Cancer Support Group  2nd Tuesday of the month 1pm-2pm, Journey Room    

## 2018-12-30 DIAGNOSIS — E782 Mixed hyperlipidemia: Secondary | ICD-10-CM | POA: Diagnosis not present

## 2018-12-30 DIAGNOSIS — F419 Anxiety disorder, unspecified: Secondary | ICD-10-CM | POA: Diagnosis not present

## 2018-12-30 DIAGNOSIS — I10 Essential (primary) hypertension: Secondary | ICD-10-CM | POA: Diagnosis not present

## 2018-12-30 DIAGNOSIS — K219 Gastro-esophageal reflux disease without esophagitis: Secondary | ICD-10-CM | POA: Diagnosis not present

## 2018-12-31 DIAGNOSIS — Z23 Encounter for immunization: Secondary | ICD-10-CM | POA: Diagnosis not present

## 2019-02-24 ENCOUNTER — Other Ambulatory Visit: Payer: Self-pay

## 2019-03-12 DIAGNOSIS — I10 Essential (primary) hypertension: Secondary | ICD-10-CM | POA: Diagnosis not present

## 2019-03-12 DIAGNOSIS — E782 Mixed hyperlipidemia: Secondary | ICD-10-CM | POA: Diagnosis not present

## 2019-03-12 DIAGNOSIS — Z6824 Body mass index (BMI) 24.0-24.9, adult: Secondary | ICD-10-CM | POA: Diagnosis not present

## 2019-03-12 DIAGNOSIS — E785 Hyperlipidemia, unspecified: Secondary | ICD-10-CM | POA: Diagnosis not present

## 2019-03-12 DIAGNOSIS — M1991 Primary osteoarthritis, unspecified site: Secondary | ICD-10-CM | POA: Diagnosis not present

## 2019-03-12 DIAGNOSIS — E663 Overweight: Secondary | ICD-10-CM | POA: Diagnosis not present

## 2019-03-12 DIAGNOSIS — E039 Hypothyroidism, unspecified: Secondary | ICD-10-CM | POA: Diagnosis not present

## 2019-03-12 DIAGNOSIS — Z0001 Encounter for general adult medical examination with abnormal findings: Secondary | ICD-10-CM | POA: Diagnosis not present

## 2019-03-12 DIAGNOSIS — F419 Anxiety disorder, unspecified: Secondary | ICD-10-CM | POA: Diagnosis not present

## 2019-03-12 DIAGNOSIS — Z1389 Encounter for screening for other disorder: Secondary | ICD-10-CM | POA: Diagnosis not present

## 2019-03-12 DIAGNOSIS — Z125 Encounter for screening for malignant neoplasm of prostate: Secondary | ICD-10-CM | POA: Diagnosis not present

## 2019-04-01 ENCOUNTER — Telehealth: Payer: Self-pay | Admitting: Cardiology

## 2019-04-01 DIAGNOSIS — M1991 Primary osteoarthritis, unspecified site: Secondary | ICD-10-CM | POA: Diagnosis not present

## 2019-04-01 DIAGNOSIS — Z720 Tobacco use: Secondary | ICD-10-CM | POA: Diagnosis not present

## 2019-04-01 DIAGNOSIS — J449 Chronic obstructive pulmonary disease, unspecified: Secondary | ICD-10-CM | POA: Diagnosis not present

## 2019-04-01 DIAGNOSIS — C9190 Lymphoid leukemia, unspecified not having achieved remission: Secondary | ICD-10-CM | POA: Diagnosis not present

## 2019-04-01 NOTE — Telephone Encounter (Signed)
Virtual Visit Pre-Appointment Phone Call  "(Name), I am calling you today to discuss your upcoming appointment. We are currently trying to limit exposure to the virus that causes COVID-19 by seeing patients at home rather than in the office."  1. "What is the BEST phone number to call the day of the visit?" - include this in appointment notes  2. "Do you have or have access to (through a family member/friend) a smartphone with video capability that we can use for your visit?" a. If yes - list this number in appt notes as "cell" (if different from BEST phone #) and list the appointment type as a VIDEO visit in appointment notes b. If no - list the appointment type as a PHONE visit in appointment notes  3. Confirm consent - "In the setting of the current Covid19 crisis, you are scheduled for a (phone or video) visit with your provider on (date) at (time).  Just as we do with many in-office visits, in order for you to participate in this visit, we must obtain consent.  If you'd like, I can send this to your mychart (if signed up) or email for you to review.  Otherwise, I can obtain your verbal consent now.  All virtual visits are billed to your insurance company just like a normal visit would be.  By agreeing to a virtual visit, we'd like you to understand that the technology does not allow for your provider to perform an examination, and thus may limit your provider's ability to fully assess your condition. If your provider identifies any concerns that need to be evaluated in person, we will make arrangements to do so.  Finally, though the technology is pretty good, we cannot assure that it will always work on either your or our end, and in the setting of a video visit, we may have to convert it to a phone-only visit.  In either situation, we cannot ensure that we have a secure connection.  Are you willing to proceed?" STAFF: Did the patient verbally acknowledge consent to telehealth visit? Document  YES/NO here: yes 4.   5. Advise patient to be prepared - "Two hours prior to your appointment, go ahead and check your blood pressure, pulse, oxygen saturation, and your weight (if you have the equipment to check those) and write them all down. When your visit starts, your provider will ask you for this information. If you have an Apple Watch or Kardia device, please plan to have heart rate information ready on the day of your appointment. Please have a pen and paper handy nearby the day of the visit as well."  6. Give patient instructions for MyChart download to smartphone OR Doximity/Doxy.me as below if video visit (depending on what platform provider is using)  7. Inform patient they will receive a phone call 15 minutes prior to their appointment time (may be from unknown caller ID) so they should be prepared to answer    Alfred Brown has been deemed a candidate for a follow-up tele-health visit to limit community exposure during the Covid-19 pandemic. I spoke with the patient via phone to ensure availability of phone/video source, confirm preferred email & phone number, and discuss instructions and expectations.  I reminded Alfred Brown to be prepared with any vital sign and/or heart rhythm information that could potentially be obtained via home monitoring, at the time of his visit. I reminded Alfred Brown to expect a phone call  prior to his visit.  Alfred Brown 04/01/2019 4:37 PM   INSTRUCTIONS FOR DOWNLOADING THE MYCHART APP TO SMARTPHONE  - The patient must first make sure to have activated MyChart and know their login information - If Apple, go to [employer] and type in MyChart in the search bar and download the app. If Android, ask patient to go to [employer] and type in [place] in the search bar and download the app. The app is free but as with any other app downloads, their phone may require them to verify saved payment information or  Apple/Android password.  - The patient will need to then log into the app with their MyChart username and password, and select [facility] as their healthcare provider to link the account. When it is time for your visit, go to the MyChart app, find appointments, and click Begin Video Visit. Be sure to Select Allow for your device to access the Microphone and Camera for your visit. You will then be connected, and your provider will be with you shortly.  **If they have any issues connecting, or need assistance please contact MyChart service desk [phone])**  **If using a computer, in order to ensure the best quality for their visit they will need to use either of the following Internet Browsers: [employer], or Google Chrome**  IF USING DOXIMITY or DOXY.ME - The patient will receive a link just prior to their visit by text.     FULL LENGTH CONSENT FOR TELE-HEALTH VISIT   I hereby voluntarily request, consent and authorize [facility] and its employed or contracted physicians, physician assistants, nurse practitioners or other licensed health care professionals (the Practitioner), to provide me with telemedicine health care services (the "Services") as deemed necessary by the treating Practitioner. I acknowledge and consent to receive the Services by the Practitioner via telemedicine. I understand that the telemedicine visit will involve communicating with the Practitioner through live audiovisual communication technology and the disclosure of certain medical information by electronic transmission. I acknowledge that I have been given the opportunity to request an in-person assessment or other available alternative prior to the telemedicine visit and am voluntarily participating in the telemedicine visit.  I understand that I have the right to withhold or withdraw my consent to the use of telemedicine in the course of my care at any time, without affecting my right to future care  or treatment, and that the Practitioner or I may terminate the telemedicine visit at any time. I understand that I have the right to inspect all information obtained and/or recorded in the course of the telemedicine visit and may receive copies of available information for a reasonable fee.  I understand that some of the potential risks of receiving the Services via telemedicine include:  [name] Delay or interruption in medical evaluation due to technological equipment failure or disruption; . Information transmitted may not be sufficient (e.g. poor resolution of images) to allow for appropriate medical decision making by the Practitioner; and/or  . In rare instances, security protocols could fail, causing a breach of personal health information.  Furthermore, I acknowledge that it is my responsibility to provide information about my medical history, conditions and care that is complete and accurate to the best of my ability. I acknowledge that Practitioner's advice, recommendations, and/or decision may be based on factors not within their control, such as incomplete or inaccurate data provided by me or distortions of diagnostic images or specimens that may result from electronic  transmissions. I understand that the practice of medicine is not an exact science and that Practitioner makes no warranties or guarantees regarding treatment outcomes. I acknowledge that I will receive a copy of this consent concurrently upon execution via email to the email address I last provided but may also request a printed copy by calling the office of [facility].    I understand that my insurance will be billed for this visit.   I have read or had this consent read to me. . I understand the contents of this consent, which adequately explains the benefits and risks of the Services being provided via telemedicine.  . I have been provided ample opportunity to ask questions regarding this consent and the Services and have had  my questions answered to my satisfaction. . I give my informed consent for the services to be provided through the use of telemedicine in my medical care  By participating in this telemedicine visit I agree to the above.

## 2019-04-14 ENCOUNTER — Other Ambulatory Visit: Payer: Self-pay

## 2019-04-14 ENCOUNTER — Ambulatory Visit: Payer: Medicare Other | Attending: Internal Medicine

## 2019-04-14 DIAGNOSIS — Z20822 Contact with and (suspected) exposure to covid-19: Secondary | ICD-10-CM | POA: Diagnosis not present

## 2019-04-15 LAB — NOVEL CORONAVIRUS, NAA: SARS-CoV-2, NAA: NOT DETECTED

## 2019-04-16 ENCOUNTER — Other Ambulatory Visit (HOSPITAL_COMMUNITY): Payer: Self-pay | Admitting: Internal Medicine

## 2019-04-16 ENCOUNTER — Other Ambulatory Visit: Payer: Self-pay | Admitting: Internal Medicine

## 2019-04-16 DIAGNOSIS — D171 Benign lipomatous neoplasm of skin and subcutaneous tissue of trunk: Secondary | ICD-10-CM

## 2019-04-20 ENCOUNTER — Ambulatory Visit: Payer: Medicare Other | Attending: Internal Medicine

## 2019-04-20 ENCOUNTER — Other Ambulatory Visit: Payer: Self-pay

## 2019-04-20 DIAGNOSIS — Z20822 Contact with and (suspected) exposure to covid-19: Secondary | ICD-10-CM | POA: Diagnosis not present

## 2019-04-22 ENCOUNTER — Telehealth: Payer: Self-pay | Admitting: *Deleted

## 2019-04-22 DIAGNOSIS — Z20828 Contact with and (suspected) exposure to other viral communicable diseases: Secondary | ICD-10-CM | POA: Diagnosis not present

## 2019-04-22 DIAGNOSIS — Z6824 Body mass index (BMI) 24.0-24.9, adult: Secondary | ICD-10-CM | POA: Diagnosis not present

## 2019-04-22 DIAGNOSIS — J019 Acute sinusitis, unspecified: Secondary | ICD-10-CM | POA: Diagnosis not present

## 2019-04-22 LAB — NOVEL CORONAVIRUS, NAA

## 2019-04-22 NOTE — Telephone Encounter (Signed)
His test result came back with a "comment" for his COVID-19 result.   I let him and wife (they were on speaker phone) he would need to be retested.  She mentioned she is positive and he has lost his taste/smell and has nasal congestion.   They did talk with their doctor this morning but needed clarification on the test result.  They thanked me for explaining what it meant.

## 2019-04-26 ENCOUNTER — Encounter: Payer: Self-pay | Admitting: Cardiology

## 2019-04-26 ENCOUNTER — Telehealth (INDEPENDENT_AMBULATORY_CARE_PROVIDER_SITE_OTHER): Payer: Medicare Other | Admitting: Cardiology

## 2019-04-26 VITALS — BP 138/72 | Ht 72.0 in | Wt 181.0 lb

## 2019-04-26 DIAGNOSIS — R002 Palpitations: Secondary | ICD-10-CM | POA: Diagnosis not present

## 2019-04-26 DIAGNOSIS — I350 Nonrheumatic aortic (valve) stenosis: Secondary | ICD-10-CM

## 2019-04-26 NOTE — Patient Instructions (Addendum)
Medication Instructions:   Your physician recommends that you continue on your current medications as directed. Please refer to the Current Medication list given to you today.  Labwork:  NONE  Testing/Procedures:  NONE  Follow-Up:  Your physician recommends that you schedule a follow-up appointment in: 8 months (office or virtual). You will receive a reminder letter in the mail in about 4-6 months reminding you to call and schedule your appointment. If you don't receive this letter, please contact our office.  Any Other Special Instructions Will Be Listed Below (If Applicable).  If you need a refill on your cardiac medications before your next appointment, please call your pharmacy.

## 2019-04-26 NOTE — Progress Notes (Signed)
Virtual Visit via Telephone Note   This visit type was conducted due to national recommendations for restrictions regarding the COVID-19 Pandemic (e.g. social distancing) in an effort to limit this patient's exposure and mitigate transmission in our community.  Due to his co-morbid illnesses, this patient is at least at moderate risk for complications without adequate follow up.  This format is felt to be most appropriate for this patient at this time.  The patient did not have access to video technology/had technical difficulties with video requiring transitioning to audio format only (telephone).  All issues noted in this document were discussed and addressed.  No physical exam could be performed with this format.  Please refer to the patient's chart for his  consent to telehealth for [facility].   Date:  04/26/2019   ID:  Celine Mans, DOB 12/06/1950, MRN [id number]  Patient Location: Home Provider Location: Office  PCP:  Redmond School, MD  Cardiologist:  Carlyle Dolly, MD  Electrophysiologist:  None   Evaluation Performed:  Follow-Up Visit  Chief Complaint:  Follow up  History of Present Illness:    Alfred Brown is a 69 y.o. male seen today for follow up of the following medical problems.    1. Papitations - long history of symtpoms.  - denies any recent symptoms.  - nonspecific episode of blurry visition while driving, had no other associated symptoms.   -denies any symptoms  2.Possible Bicuspid aortic valve/ Aortic stenosis - somewhat questionable on prior imaging - CTA chest 12/2004 without significant aortopathy.  - 2019 echo LVEF 60-65%, mild to mod AS (mean grad 17, AVA VTI 1.4)  - no recent SOB/DOE, no chest pain, no syncope  3. CLL - followed at cancer center - from notes just under observation, no indciations for current treatment       wife has [place] currently. Patient with negative test but then indeterminant taste, due to his symptoms  being treated as if he has COVID     Past Medical History:  Diagnosis Date  . Aortic valve disorder    Mild insufficiency  . Chronic lymphocytic leukemia ([place])    01/2012: WBC-90.2, H&H-15.1/45.3, platelets-185 03/31/12: Verified with Dr. Tressie Stalker that no precautions or modification of medical regime are required prior to orthopaedic surgery.   . CLL (chronic lymphocytic leukemia) ([place])   . CMV (cytomegalovirus) ([place])   . COPD (chronic obstructive pulmonary disease) ([place])   . Depression with anxiety   . DJD (degenerative joint disease)   . GERD (gastroesophageal reflux disease)   . Hyperlipidemia   . Lipoma    left chest wall  . Palpitations 2004   PVCs; borderline stress nuclear in 2004, negative in 2007; Echo 2007; AV-sclerotic, very mild AI  . Right bundle Darrel Gloss block    + left posterior fascicular block  . Tobacco abuse    80 pack years   Past Surgical History:  Procedure Laterality Date  . COLONOSCOPY  01/2012   Negative screening study  . GANGLION CYST EXCISION  1997   left wrist  . ROTATOR CUFF REPAIR  1997   right  . SHOULDER ARTHROSCOPY WITH SUBACROMIAL DECOMPRESSION  04/09/2012   Procedure: SHOULDER ARTHROSCOPY WITH SUBACROMIAL DECOMPRESSION;  Surgeon: Ninetta Lights, MD;  Location: [place];  Service: Orthopedics;  Laterality: Left;  LEFT SHOULDER ARTHROSCOPY, SUBACROMIAL DECOMPRESSION, PARTIAL ACROMIOPLASTY WITH CORACOACROMIAL RELEASE, DISTAL CLAVICULECTOMY WITH ROTATOR CUFF REPAIR, DEBRIDEMENT OF LABRUM     Current Meds  Medication Sig  .  albuterol (PROVENTIL HFA;VENTOLIN HFA) 108 (90 BASE) MCG/ACT inhaler Inhale 2 puffs into the lungs every 6 (six) hours as needed for wheezing or shortness of breath.   . ALPRAZolam (XANAX) 1 MG tablet Take 1 mg by mouth 3 (three) times daily as needed for anxiety.   [name] aspirin 325 MG tablet Take 325 mg by mouth daily.    . betamethasone dipropionate (DIPROLENE) 0.05 % cream   . buPROPion (WELLBUTRIN SR) 150  MG 12 hr tablet Take 150 mg by mouth daily.   . calcium carbonate (OS-CAL) 600 MG TABS Take 600 mg by mouth daily.    . celecoxib (CELEBREX) 100 MG capsule Take 100 mg by mouth 2 (two) times daily.  . clotrimazole-betamethasone (LOTRISONE) cream Apply 1 application topically daily as needed. Affected area  . Diphenhydramine-PE-APAP 12.5-5-325 MG TABS Take 1 tablet by mouth as needed (sinus). sinus  . doxycycline (VIBRA-TABS) 100 MG tablet Take 100 mg by mouth 2 (two) times daily.  . fish oil-omega-3 fatty acids 1000 MG capsule Take 2 g by mouth daily.    . fluticasone (FLONASE) 50 MCG/ACT nasal spray Place 2 sprays into the nose daily.    [name] HYDROcodone-acetaminophen (NORCO/VICODIN) 5-325 MG per tablet Take 1 tablet by mouth every 6 (six) hours as needed for moderate pain.   . Loratadine 10 MG CAPS Take 10 mg by mouth daily.   [name] lovastatin (MEVACOR) 20 MG tablet Take 20 mg by mouth daily.  . Multiple Vitamin (MULTIVITAMIN) tablet Take 1 tablet by mouth daily.    . niacin (NIASPAN) 500 MG CR tablet Take 500 mg by mouth daily.  . valACYclovir (VALTREX) 500 MG tablet Take 500 mg by mouth daily.     Allergies:   Patient has no known allergies.   Social History   Tobacco Use  . Smoking status: Current Every Day Smoker    Packs/day: 1.00    Years: 45.00    Pack years: 45.00    Types: Cigarettes  . Smokeless tobacco: Never Used  . Tobacco comment: Consumption decreased approximately 2010 to one pack per day  Substance Use Topics  . Alcohol use: No    Alcohol/week: 0.0 standard drinks  . Drug use: No     Family Hx: The patient's family history includes Cancer in his brother and sister; Colon cancer in his sister; Heart attack in his father; Stroke in his mother.  ROS:   Please see the history of present illness.     All other systems reviewed and are negative.   Prior CV studies:   The following studies were reviewed today:  09/2005 GXT STRESS DATA: Patient underwent exercise  stress testing on the Bruce Protocol. Baseline EKG showed sinus rhythm at 59 beats per minute, right bundle Odarius Dines block. Baseline blood pressure was 132/70.  The patient exercised for 7 minutes, 52 seconds to a peak heart rate of 151 which was 90% predicted maximal. Peak blood pressure is 182/68. Patient stopped because of fatigue, experienced no chest pain. EKG showed no significant ST changes to suggest ischemia.  IMPRESSION: Exercise stress test. Clinically and electrically negative for ischemia. Myoview scan pending.   09/2005 echo . Technically adequate echocardiographic study. 2. Normal left atrium, right atrium and right ventricle. 3. Trileaflet aortic valve with mild to moderate sclerosis of the leaflets;  reasonably good leaflet excursion; very mild insufficiency and trivial,  if any, stenosis by Doppler. 4. Normal diameter of the proximal ascending aorta with mild calcification  of the wall. 5.  Mild mitral valve thickening; mild annular calcification. 6. Normal tricuspid and pulmonic valves; normal proximal pulmonary artery. 7. Normal left ventricular size; borderline hypertrophy; normal regional  and global function. 8. Comparison with prior study of January 31, 2003: No significant  interval change.  10/2017 echo Study Conclusions  - Left ventricle: The cavity size was normal. Wall thickness was   normal. Systolic function was normal. The estimated ejection   fraction was in the range of 60% to 65%. Wall motion was normal;   there were no regional wall motion abnormalities. Doppler   parameters are consistent with abnormal left ventricular   relaxation (grade 1 diastolic dysfunction). - Aortic valve: Mildly to moderately calcified annulus. Trileaflet;   mildly calcified leaflets. There was mild to moderate stenosis.   There was mild regurgitation. Peak velocity (S): 280 cm/s. Mean   gradient (S): 17 mm Hg.  Valve area (VTI): 1.4 cm^2. Valve area   (Vmax): 1.2 cm^2. Valve area (Vmean): 1.24 cm^2. Labs/Other Tests and Data Reviewed:    EKG:  No ECG reviewed.  Recent Labs: 10/23/2018: ALT 16; BUN 19; Creatinine, Ser 1.10; Hemoglobin 14.7; Platelets 136; Potassium 4.2; Sodium 141   Recent Lipid Panel No results found for: CHOL, TRIG, HDL, CHOLHDL, LDLCALC, LDLDIRECT  Wt Readings from Last 3 Encounters:  04/26/19 181 lb (82.1 kg)  04/23/18 187 lb 2 oz (84.9 kg)  10/22/17 183 lb (83 kg)     Objective:    Vital Signs:  BP 138/72   Ht 6' (1.829 m)   Wt 181 lb (82.1 kg)   BMI 24.55 kg/m    Normal affect. Normal speech pattern and tone. Comfortable, no apparent distress. No audible signs of SOB or wheezing.   ASSESSMENT & PLAN:    1. Palpitaitons - previous monitor with just occasional PACs, no significant arrhythmias - no recent symptoms, continue to monitor.   2. Possible Bicuspid aortic valve/ Aortic stenosis - mild to moderate AS by 2019 echo, will repeat echo later this year.    COVID-19 Education: The signs and symptoms of COVID-19 were discussed with the patient and how to seek care for testing (follow up with PCP or arrange E-visit).  The importance of social distancing was discussed today.  Time:   Today, I have spent 14 minutes with the patient with telehealth technology discussing the above problems.     Medication Adjustments/Labs and Tests Ordered: Current medicines are reviewed at length with the patient today.  Concerns regarding medicines are outlined above.   Tests Ordered: No orders of the defined types were placed in this encounter.   Medication Changes: No orders of the defined types were placed in this encounter.   Follow Up:  Either In Person or Virtual in 8 month(s)  Signed, Carlyle Dolly, MD  04/26/2019 11:33 AM    [place]

## 2019-04-28 ENCOUNTER — Other Ambulatory Visit (HOSPITAL_COMMUNITY): Payer: Self-pay | Admitting: Internal Medicine

## 2019-04-28 DIAGNOSIS — R0989 Other specified symptoms and signs involving the circulatory and respiratory systems: Secondary | ICD-10-CM

## 2019-04-30 ENCOUNTER — Ambulatory Visit (HOSPITAL_COMMUNITY)
Admission: RE | Admit: 2019-04-30 | Discharge: 2019-04-30 | Disposition: A | Discharge status: Home / Self Care | Payer: Medicare Other | Source: Ambulatory Visit | Attending: Internal Medicine | Admitting: Internal Medicine

## 2019-04-30 ENCOUNTER — Other Ambulatory Visit: Payer: Self-pay

## 2019-04-30 DIAGNOSIS — I6523 Occlusion and stenosis of bilateral carotid arteries: Secondary | ICD-10-CM | POA: Diagnosis not present

## 2019-04-30 DIAGNOSIS — R0989 Other specified symptoms and signs involving the circulatory and respiratory systems: Secondary | ICD-10-CM | POA: Insufficient documentation

## 2019-05-02 DIAGNOSIS — C9190 Lymphoid leukemia, unspecified not having achieved remission: Secondary | ICD-10-CM | POA: Diagnosis not present

## 2019-05-02 DIAGNOSIS — E7849 Other hyperlipidemia: Secondary | ICD-10-CM | POA: Diagnosis not present

## 2019-05-02 DIAGNOSIS — J449 Chronic obstructive pulmonary disease, unspecified: Secondary | ICD-10-CM | POA: Diagnosis not present

## 2019-05-02 DIAGNOSIS — M1991 Primary osteoarthritis, unspecified site: Secondary | ICD-10-CM | POA: Diagnosis not present

## 2019-05-03 ENCOUNTER — Other Ambulatory Visit (HOSPITAL_COMMUNITY): Payer: Self-pay | Admitting: *Deleted

## 2019-05-03 DIAGNOSIS — C9110 Chronic lymphocytic leukemia of B-cell type not having achieved remission: Secondary | ICD-10-CM

## 2019-05-03 DIAGNOSIS — D801 Nonfamilial hypogammaglobulinemia: Secondary | ICD-10-CM

## 2019-05-04 ENCOUNTER — Inpatient Hospital Stay (HOSPITAL_COMMUNITY): Payer: Medicare Other | Attending: Hematology

## 2019-05-04 ENCOUNTER — Other Ambulatory Visit: Payer: Self-pay

## 2019-05-04 DIAGNOSIS — Z79899 Other long term (current) drug therapy: Secondary | ICD-10-CM | POA: Diagnosis not present

## 2019-05-04 DIAGNOSIS — C9110 Chronic lymphocytic leukemia of B-cell type not having achieved remission: Secondary | ICD-10-CM | POA: Diagnosis not present

## 2019-05-04 DIAGNOSIS — D801 Nonfamilial hypogammaglobulinemia: Secondary | ICD-10-CM

## 2019-05-04 DIAGNOSIS — F17210 Nicotine dependence, cigarettes, uncomplicated: Secondary | ICD-10-CM | POA: Diagnosis not present

## 2019-05-04 LAB — CBC WITH DIFFERENTIAL/PLATELET
Abs Immature Granulocytes: 0.31 10*3/uL — ABNORMAL HIGH (ref 0.00–0.07)
Basophils Absolute: 0.1 10*3/uL (ref 0.0–0.1)
Basophils Relative: 0 %
Eosinophils Absolute: 0.1 10*3/uL (ref 0.0–0.5)
Eosinophils Relative: 0 %
HCT: 46.6 % (ref 39.0–52.0)
Hemoglobin: 14.5 g/dL (ref 13.0–17.0)
Immature Granulocytes: 0 %
Lymphocytes Relative: 92 %
Lymphs Abs: 103.2 10*3/uL — ABNORMAL HIGH (ref 0.7–4.0)
MCH: 30 pg (ref 26.0–34.0)
MCHC: 31.1 g/dL (ref 30.0–36.0)
MCV: 96.3 fL (ref 80.0–100.0)
Monocytes Absolute: 1.5 10*3/uL — ABNORMAL HIGH (ref 0.1–1.0)
Monocytes Relative: 1 %
Neutro Abs: 8.1 10*3/uL — ABNORMAL HIGH (ref 1.7–7.7)
Neutrophils Relative %: 7 %
Platelets: 118 10*3/uL — ABNORMAL LOW (ref 150–400)
RBC: 4.84 MIL/uL (ref 4.22–5.81)
RDW: 13.8 % (ref 11.5–15.5)
WBC: 113.3 10*3/uL (ref 4.0–10.5)
nRBC: 0 % (ref 0.0–0.2)

## 2019-05-04 LAB — COMPREHENSIVE METABOLIC PANEL
ALT: 19 U/L (ref 0–44)
AST: 21 U/L (ref 15–41)
Albumin: 4.1 g/dL (ref 3.5–5.0)
Alkaline Phosphatase: 116 U/L (ref 38–126)
Anion gap: 7 (ref 5–15)
BUN: 25 mg/dL — ABNORMAL HIGH (ref 8–23)
CO2: 29 mmol/L (ref 22–32)
Calcium: 9.2 mg/dL (ref 8.9–10.3)
Chloride: 106 mmol/L (ref 98–111)
Creatinine, Ser: 0.85 mg/dL (ref 0.61–1.24)
GFR calc Af Amer: >60 mL/min (ref >60)
GFR calc non Af Amer: >60 mL/min (ref >60)
Glucose, Bld: 94 mg/dL (ref 70–99)
Potassium: 5 mmol/L (ref 3.5–5.1)
Sodium: 142 mmol/L (ref 135–145)
Total Bilirubin: 0.6 mg/dL (ref 0.3–1.2)
Total Protein: 6.3 g/dL — ABNORMAL LOW (ref 6.5–8.1)

## 2019-05-04 LAB — LACTATE DEHYDROGENASE: LDH: 172 U/L (ref 98–192)

## 2019-05-05 LAB — IGG, IGA, IGM
IgA: 28 mg/dL — ABNORMAL LOW (ref 61–437)
IgG (Immunoglobin G), Serum: 377 mg/dL — ABNORMAL LOW (ref 603–1613)
IgM (Immunoglobulin M), Srm: 13 mg/dL — ABNORMAL LOW (ref 20–172)

## 2019-05-06 DIAGNOSIS — R109 Unspecified abdominal pain: Secondary | ICD-10-CM | POA: Diagnosis not present

## 2019-05-06 DIAGNOSIS — R509 Fever, unspecified: Secondary | ICD-10-CM | POA: Diagnosis not present

## 2019-05-06 DIAGNOSIS — Z20828 Contact with and (suspected) exposure to other viral communicable diseases: Secondary | ICD-10-CM | POA: Diagnosis not present

## 2019-05-06 DIAGNOSIS — Z6823 Body mass index (BMI) 23.0-23.9, adult: Secondary | ICD-10-CM | POA: Diagnosis not present

## 2019-05-06 DIAGNOSIS — C9190 Lymphoid leukemia, unspecified not having achieved remission: Secondary | ICD-10-CM | POA: Diagnosis not present

## 2019-05-09 ENCOUNTER — Emergency Department (HOSPITAL_COMMUNITY)
Admission: EM | Admit: 2019-05-09 | Discharge: 2019-05-09 | Disposition: A | Discharge status: Home / Self Care | Payer: Medicare Other | Attending: Emergency Medicine | Admitting: Emergency Medicine

## 2019-05-09 ENCOUNTER — Encounter (HOSPITAL_COMMUNITY): Payer: Self-pay

## 2019-05-09 ENCOUNTER — Other Ambulatory Visit: Payer: Self-pay

## 2019-05-09 ENCOUNTER — Emergency Department (HOSPITAL_COMMUNITY): Payer: Medicare Other

## 2019-05-09 DIAGNOSIS — U071 COVID-19: Secondary | ICD-10-CM | POA: Diagnosis not present

## 2019-05-09 DIAGNOSIS — J449 Chronic obstructive pulmonary disease, unspecified: Secondary | ICD-10-CM | POA: Insufficient documentation

## 2019-05-09 DIAGNOSIS — Z79899 Other long term (current) drug therapy: Secondary | ICD-10-CM | POA: Diagnosis not present

## 2019-05-09 DIAGNOSIS — F17210 Nicotine dependence, cigarettes, uncomplicated: Secondary | ICD-10-CM | POA: Insufficient documentation

## 2019-05-09 DIAGNOSIS — Z7982 Long term (current) use of aspirin: Secondary | ICD-10-CM | POA: Insufficient documentation

## 2019-05-09 DIAGNOSIS — R509 Fever, unspecified: Secondary | ICD-10-CM | POA: Diagnosis not present

## 2019-05-09 DIAGNOSIS — Z20822 Contact with and (suspected) exposure to covid-19: Secondary | ICD-10-CM

## 2019-05-09 LAB — CBC WITH DIFFERENTIAL/PLATELET
Abs Immature Granulocytes: 0.11 10*3/uL — ABNORMAL HIGH (ref 0.00–0.07)
Basophils Absolute: 0 10*3/uL (ref 0.0–0.1)
Basophils Relative: 0 %
Eosinophils Absolute: 0 10*3/uL (ref 0.0–0.5)
Eosinophils Relative: 0 %
HCT: 42.1 % (ref 39.0–52.0)
Hemoglobin: 13.3 g/dL (ref 13.0–17.0)
Immature Granulocytes: 0 %
Lymphocytes Relative: 92 %
Lymphs Abs: 58.8 10*3/uL — ABNORMAL HIGH (ref 0.7–4.0)
MCH: 30.2 pg (ref 26.0–34.0)
MCHC: 31.6 g/dL (ref 30.0–36.0)
MCV: 95.7 fL (ref 80.0–100.0)
Monocytes Absolute: 0.6 10*3/uL (ref 0.1–1.0)
Monocytes Relative: 1 %
Neutro Abs: 4.5 10*3/uL (ref 1.7–7.7)
Neutrophils Relative %: 7 %
Platelets: 106 10*3/uL — ABNORMAL LOW (ref 150–400)
RBC: 4.4 MIL/uL (ref 4.22–5.81)
RDW: 13.6 % (ref 11.5–15.5)
WBC: 64 10*3/uL (ref 4.0–10.5)
nRBC: 0 % (ref 0.0–0.2)

## 2019-05-09 LAB — COMPREHENSIVE METABOLIC PANEL
ALT: 18 U/L (ref 0–44)
AST: 25 U/L (ref 15–41)
Albumin: 3.4 g/dL — ABNORMAL LOW (ref 3.5–5.0)
Alkaline Phosphatase: 83 U/L (ref 38–126)
Anion gap: 7 (ref 5–15)
BUN: 31 mg/dL — ABNORMAL HIGH (ref 8–23)
CO2: 25 mmol/L (ref 22–32)
Calcium: 8.1 mg/dL — ABNORMAL LOW (ref 8.9–10.3)
Chloride: 107 mmol/L (ref 98–111)
Creatinine, Ser: 1.04 mg/dL (ref 0.61–1.24)
GFR calc Af Amer: >60 mL/min (ref >60)
GFR calc non Af Amer: >60 mL/min (ref >60)
Glucose, Bld: 112 mg/dL — ABNORMAL HIGH (ref 70–99)
Potassium: 4.2 mmol/L (ref 3.5–5.1)
Sodium: 139 mmol/L (ref 135–145)
Total Bilirubin: 0.6 mg/dL (ref 0.3–1.2)
Total Protein: 5.9 g/dL — ABNORMAL LOW (ref 6.5–8.1)

## 2019-05-09 LAB — URINALYSIS, ROUTINE W REFLEX MICROSCOPIC
Bacteria, UA: NONE SEEN
Bilirubin Urine: NEGATIVE
Glucose, UA: NEGATIVE mg/dL
Hgb urine dipstick: NEGATIVE
Ketones, ur: NEGATIVE mg/dL
Leukocytes,Ua: NEGATIVE
Nitrite: NEGATIVE
Protein, ur: 30 mg/dL — AB
Specific Gravity, Urine: 1.028 (ref 1.005–1.030)
pH: 6 (ref 5.0–8.0)

## 2019-05-09 LAB — POC SARS CORONAVIRUS 2 AG -  ED: SARS Coronavirus 2 Ag: NEGATIVE

## 2019-05-09 LAB — LACTIC ACID, PLASMA: Lactic Acid, Venous: 0.8 mmol/L (ref 0.5–1.9)

## 2019-05-09 LAB — LIPASE, BLOOD: Lipase: 26 U/L (ref 11–51)

## 2019-05-09 NOTE — Discharge Instructions (Addendum)
The testing today indicates that you have a COVID-19 infection.  At this point there is no specific treatment.  You probably qualify to receive monoclonal antibody treatments through the [facility] infusion center.  They will likely contact you but you can call them through the number that we are giving you, to help arrange that.  In the meantime make sure you stay at home, away from other people, get plenty of rest and drink a lot of fluids.  Return here if needed for problems.  As long as you do not have severe shortness of breath, dizziness or inability to take your medications, you should do okay.  Most people get better after 10 days or so.  See your primary care doctor if needed for problems.

## 2019-05-09 NOTE — ED Notes (Signed)
Date and time results received: 05/09/19 1529 (use smartphrase ".now" to insert current time)  Test: wbc Critical Value: 64  Name of Provider Notified: wentz  Orders Received? Or Actions Taken?: see chart

## 2019-05-09 NOTE — ED Provider Notes (Addendum)
[facility] EMERGENCY DEPARTMENT Provider Note   CSN: [id number] Arrival date & time: 05/09/19  1300     History Chief Complaint  Patient presents with  . Fever    Alfred Brown is a 69 y.o. male.  Patient states that since Tuesday has been having fevers on and off as high as 102 also associated with dry cough and some mild diaphoresis.  Patient's wife tested positive for COVID-19 infection in January.  Patient at that time was tested and was negative did have loss of taste and smell.  However he does not have that now.  Past medical history is significant for chronic lymphocytic leukemia COPD aortic valve disorder.        Past Medical History:  Diagnosis Date  . Aortic valve disorder    Mild insufficiency  . Chronic lymphocytic leukemia ([place])    01/2012: WBC-90.2, H&H-15.1/45.3, platelets-185 03/31/12: Verified with Dr. Tressie Stalker that no precautions or modification of medical regime are required prior to orthopaedic surgery.   . CLL (chronic lymphocytic leukemia) ([place])   . CMV (cytomegalovirus) ([place])   . COPD (chronic obstructive pulmonary disease) ([place])   . Depression with anxiety   . DJD (degenerative joint disease)   . GERD (gastroesophageal reflux disease)   . Hyperlipidemia   . Lipoma    left chest wall  . Palpitations 2004   PVCs; borderline stress nuclear in 2004, negative in 2007; Echo 2007; AV-sclerotic, very mild AI  . Right bundle branch block    + left posterior fascicular block  . Tobacco abuse    80 pack years    Patient Active Problem List   Diagnosis Date Noted  . GERD (gastroesophageal reflux disease)   . Hyperlipidemia   . Palpitations   . Chronic lymphocytic leukemia ([place])   . Aortic valve disorder   . Right bundle branch block   . Tobacco abuse     Past Surgical History:  Procedure Laterality Date  . COLONOSCOPY  01/2012   Negative screening study  . GANGLION CYST EXCISION  1997   left wrist  . ROTATOR CUFF REPAIR  1997   right    . SHOULDER ARTHROSCOPY WITH SUBACROMIAL DECOMPRESSION  04/09/2012   Procedure: SHOULDER ARTHROSCOPY WITH SUBACROMIAL DECOMPRESSION;  Surgeon: Ninetta Lights, MD;  Location: [place];  Service: Orthopedics;  Laterality: Left;  LEFT SHOULDER ARTHROSCOPY, SUBACROMIAL DECOMPRESSION, PARTIAL ACROMIOPLASTY WITH CORACOACROMIAL RELEASE, DISTAL CLAVICULECTOMY WITH ROTATOR CUFF REPAIR, DEBRIDEMENT OF LABRUM       Family History  Problem Relation Age of Onset  . Stroke Mother   . Heart attack Father   . Cancer Sister        colon  . Colon cancer Sister   . Cancer Brother        2 brothers died with lung cancer    Social History   Tobacco Use  . Smoking status: Current Every Day Smoker    Packs/day: 1.00    Years: 45.00    Pack years: 45.00    Types: Cigarettes  . Smokeless tobacco: Never Used  . Tobacco comment: Consumption decreased approximately 2010 to one pack per day  Substance Use Topics  . Alcohol use: No    Alcohol/week: 0.0 standard drinks  . Drug use: No    Home Medications Prior to Admission medications   Medication Sig Start Date End Date Taking? Authorizing Provider  acetaminophen (TYLENOL) 325 MG tablet Take 650 mg by mouth every 6 (six) hours as needed for  mild pain, fever or headache.   Yes [provider]  albuterol (PROVENTIL HFA;VENTOLIN HFA) 108 (90 BASE) MCG/ACT inhaler Inhale 2 puffs into the lungs every 6 (six) hours as needed for wheezing or shortness of breath.    Yes [provider]  ALPRAZolam Duanne Moron) 1 MG tablet Take 1 mg by mouth 3 (three) times daily as needed for anxiety.    Yes [provider]  aspirin 325 MG tablet Take 325 mg by mouth daily.     Yes [provider]  buPROPion (WELLBUTRIN SR) 150 MG 12 hr tablet Take 150 mg by mouth daily.    Yes [provider]  calcium carbonate (OS-CAL) 600 MG TABS Take 600 mg by mouth daily.     Yes [provider]  celecoxib (CELEBREX) 100 MG  capsule Take 100 mg by mouth 2 (two) times daily.   Yes [provider]  ciprofloxacin (CIPRO) 500 MG tablet Take 500 mg by mouth 2 (two) times daily. 05/06/19  Yes [provider]  clotrimazole-betamethasone (LOTRISONE) cream Apply 1 application topically daily as needed. Affected area 06/19/15  Yes [provider]  fish oil-omega-3 fatty acids 1000 MG capsule Take 1 g by mouth daily.    Yes [provider]  fluticasone (FLONASE) 50 MCG/ACT nasal spray Place 2 sprays into the nose daily.     Yes [provider]  HYDROcodone-acetaminophen (NORCO/VICODIN) 5-325 MG per tablet Take 1 tablet by mouth every 6 (six) hours as needed for moderate pain.  08/10/14  Yes [provider]  Loratadine 10 MG CAPS Take 10 mg by mouth daily.    Yes [provider]  lovastatin (MEVACOR) 20 MG tablet Take 20 mg by mouth daily. 09/29/14  Yes [provider]  Multiple Vitamin (MULTIVITAMIN) tablet Take 1 tablet by mouth daily.     Yes [provider]  niacin (NIASPAN) 500 MG CR tablet Take 500 mg by mouth daily. 09/29/14  Yes [provider]  valACYclovir (VALTREX) 500 MG tablet Take 500 mg by mouth daily. 03/29/19  Yes [provider]  methylPREDNISolone (MEDROL DOSEPAK) 4 MG TBPK tablet Take 1-6 tablets by mouth as directed. Patient takes 6 tablets on day 1, 5 tablets on day 2, 4 tablets on day 3, 3 tablets on day 4, 2 tablets on day 5, then 1 tablet on day 6 04/29/19   [provider]    Allergies    Patient has no known allergies.  Review of Systems   Review of Systems  Constitutional: Positive for diaphoresis and fever. Negative for chills.  HENT: Negative for rhinorrhea and sore throat.   Eyes: Negative for visual disturbance.  Respiratory: Positive for cough. Negative for shortness of breath.   Cardiovascular: Negative for chest pain and leg swelling.  Gastrointestinal: Negative for abdominal pain, diarrhea,  nausea and vomiting.  Genitourinary: Negative for dysuria.  Musculoskeletal: Negative for back pain and neck pain.  Skin: Negative for rash.  Neurological: Negative for dizziness, light-headedness and headaches.  Hematological: Does not bruise/bleed easily.  Psychiatric/Behavioral: Negative for confusion.    Physical Exam Updated Vital Signs BP 119/62 (BP Location: Right Arm)   Pulse 99   Temp 98.4 F (36.9 C) (Oral)   Resp 16   Ht 1.854 m (6\' 1" )   Wt 78 kg   SpO2 92%   BMI 22.69 kg/m   Physical Exam Vitals and nursing note reviewed.  Constitutional:      Appearance: Normal appearance. He is  well-developed.  HENT:     Head: Normocephalic and atraumatic.  Eyes:     Extraocular Movements: Extraocular movements intact.     Conjunctiva/sclera: Conjunctivae normal.     Pupils: Pupils are equal, round, and reactive to light.  Cardiovascular:     Rate and Rhythm: Normal rate and regular rhythm.     Heart sounds: No murmur.  Pulmonary:     Effort: Pulmonary effort is normal. No respiratory distress.     Breath sounds: Normal breath sounds.  Abdominal:     Palpations: Abdomen is soft.     Tenderness: There is no abdominal tenderness.  Musculoskeletal:        General: Normal range of motion.     Cervical back: Neck supple.  Skin:    General: Skin is warm and dry.     Capillary Refill: Capillary refill takes less than 2 seconds.  Neurological:     General: No focal deficit present.     Mental Status: He is alert and oriented to person, place, and time.     ED Results / Procedures / Treatments   Labs (all labs ordered are listed, but only abnormal results are displayed) Labs Reviewed  CULTURE, BLOOD (ROUTINE X 2)  CULTURE, BLOOD (ROUTINE X 2)  CBC WITH DIFFERENTIAL/PLATELET  COMPREHENSIVE METABOLIC PANEL  LIPASE, BLOOD  LACTIC ACID, PLASMA  URINALYSIS, ROUTINE W REFLEX MICROSCOPIC  POC SARS CORONAVIRUS 2 AG -  ED    EKG None  Radiology DG Chest Port 1  View  Result Date: 05/09/2019 CLINICAL DATA:  Fever EXAM: PORTABLE CHEST 1 VIEW COMPARISON:  07/05/2015 FINDINGS: Patchy bilateral airspace disease. No Kerley lines, effusion, or pneumothorax. Normal heart size and aortic contours. Aortic atherosclerosis. IMPRESSION: Patchy bilateral pneumonia. Electronically Signed   By: Monte Fantasia M.D.   On: 05/09/2019 14:28    Procedures Procedures (including critical care time)  Medications Ordered in ED Medications - No data to display  ED Course  I have reviewed the triage vital signs and the nursing notes.  Pertinent labs & imaging results that were available during my care of the patient were reviewed by me and considered in my medical decision making (see chart for details).    MDM Rules/Calculators/A&P                      Patient symptoms could be consistent with COVID-19 infection or infection from other areas.  Will obtain labs reviewed chest x-ray.  Patient stated that elevated bilirubin so will consider possibility getting CT abdomen pelvis to evaluate that as well.  Labs still pending.  The chest x-ray suggestive of bilateral patchy pneumonia.  Very concerning for possible COVID-19 infection.  In light of the history of chronic lymphocytic leukemia this could be very concerning.  Patient not hypoxic so therefore at this point in time admission not mandated.  Final Clinical Impression(s) / ED Diagnoses Final diagnoses:  Suspected COVID-19 virus infection    Rx / DC Orders ED Discharge Orders    None       Fredia Sorrow, MD 05/09/19 1445    Fredia Sorrow, MD 05/09/19 1447

## 2019-05-09 NOTE — ED Triage Notes (Signed)
Pt presents to ED with fever up to 102 since Tuesday. Pt states she has also had a dry cough.

## 2019-05-09 NOTE — ED Provider Notes (Signed)
3:05 PM-checkout from Dr. Bobby Rumpf to evaluate labs after return.  Patient presenting with known exposure to Covid, his wife.  Has clinical findings consistent with Covid including chest x-ray showing bilateral patchy pneumonia.  Vital signs on admission are reassuring.  Oxygen saturation is 92%.  Clinical Course as of May 09 1635  Nancy Fetter May 09, 2019  1510 Bilateral patchy pneumonia consistent with viral process.  No focal large consolidations, interpreted by me.  DG Chest Port 1 View [EW]  1511 Normal except glucose high, BUN high, calcium low, total protein low, albumin low  Comprehensive metabolic panel(!) [EW]  5040 Normal  Urinalysis, Routine w reflex microscopic(!) [EW]  1608 Normal  POC SARS Coronavirus 2 Ag-ED - Nasal Swab (BD Veritor Kit) [EW]  1609 Normal except white count high, platelets low, differential with predominant lymphocytes  CBC with Differential/Platelet(!!) [EW]  1609 Normal  Lactic acid, plasma [EW]  1609 Comprehensive metabolic panel(!) [EW]    Clinical Course User Index [EW] Daleen Bo, MD      Patient Vitals for the past 24 hrs:  BP Temp Temp src Pulse Resp SpO2 Height Weight  05/09/19 1515 -- -- -- 79 -- 95 % -- --  05/09/19 1308 119/62 98.4 F (36.9 C) Oral 99 16 92 % 6' 1" (1.854 m) 78 kg    3:11 PM Reevaluation with update and discussion. After initial assessment and treatment, an updated evaluation reveals he is comfortable has no further complaints, findings discussed with the patient and all questions were answered. Daleen Bo   Medical Decision Making: Short-term illness likely representing COVID-19 infection.  Antigen test negative today.  PCR pending.  No respiratory distress, and he has normal oxygen saturation on room air.  White count was high however it is actually suppressed from his baseline, he has CLL.  Vital signs are normal.  No indication for hospitalization at this time.  CRITICAL CARE- no Performed by: Daleen Bo    Nursing Notes Reviewed/ Care Coordinated Applicable Imaging Reviewed Interpretation of Laboratory Data incorporated into ED treatment  The patient appears reasonably screened and/or stabilized for discharge and I doubt any other medical condition or other [facility] requiring further screening, evaluation, or treatment in the ED at this time prior to discharge.  Plan: Home Medications-continue usual, Tylenol for pain or fever; Home Treatments-rest, drink plenty fluids; return here if the recommended treatment, does not improve the symptoms; Recommended follow up-PCP, as needed.  Contact Covid infusion center, to see if he qualifies for medical antibodies, I phoned in a referral.      Daleen Bo, MD 05/09/19 [phone]

## 2019-05-09 NOTE — ED Notes (Signed)
Pt ambulatory to RM 10. O2 sats 92% with ambulation.

## 2019-05-10 ENCOUNTER — Ambulatory Visit (HOSPITAL_COMMUNITY)
Admission: RE | Admit: 2019-05-10 | Discharge: 2019-05-10 | Disposition: A | Discharge status: Home / Self Care | Payer: Medicare Other | Source: Ambulatory Visit | Attending: Pulmonary Disease | Admitting: Pulmonary Disease

## 2019-05-10 ENCOUNTER — Other Ambulatory Visit: Payer: Self-pay | Admitting: Physician Assistant

## 2019-05-10 DIAGNOSIS — Z23 Encounter for immunization: Secondary | ICD-10-CM | POA: Insufficient documentation

## 2019-05-10 DIAGNOSIS — U071 COVID-19: Secondary | ICD-10-CM | POA: Insufficient documentation

## 2019-05-10 DIAGNOSIS — C9110 Chronic lymphocytic leukemia of B-cell type not having achieved remission: Secondary | ICD-10-CM

## 2019-05-10 LAB — SARS CORONAVIRUS 2 (TAT 6-24 HRS): SARS Coronavirus 2: POSITIVE — AB

## 2019-05-10 MED ORDER — METHYLPREDNISOLONE SODIUM SUCC 125 MG IJ SOLR: 125.0000 mg | Freq: Once | INTRAMUSCULAR | Status: DC | PRN

## 2019-05-10 MED ORDER — EPINEPHRINE 0.3 MG/0.3ML IJ SOAJ: 0.3000 mg | Freq: Once | INTRAMUSCULAR | Status: DC | PRN

## 2019-05-10 MED ORDER — ALBUTEROL SULFATE HFA 108 (90 BASE) MCG/ACT IN AERS: 2.0000 | INHALATION_SPRAY | Freq: Once | RESPIRATORY_TRACT | Status: DC | PRN

## 2019-05-10 MED ORDER — SODIUM CHLORIDE 0.9 % IV SOLN: INTRAVENOUS | Status: DC | PRN

## 2019-05-10 MED ORDER — SODIUM CHLORIDE 0.9 % IV SOLN
700.0000 mg | Freq: Once | INTRAVENOUS | Status: AC
  Administered 2019-05-10: 700 mg via INTRAVENOUS
  Filled 2019-05-10: qty 20

## 2019-05-10 MED ORDER — DIPHENHYDRAMINE HCL 50 MG/ML IJ SOLN: 50.0000 mg | Freq: Once | INTRAMUSCULAR | Status: DC | PRN

## 2019-05-10 MED ORDER — FAMOTIDINE IN NACL 20-0.9 MG/50ML-% IV SOLN: 20.0000 mg | Freq: Once | INTRAVENOUS | Status: DC | PRN

## 2019-05-10 NOTE — Progress Notes (Signed)
  I connected by phone with Alfred Brown on 05/10/2019 at 8:59 AM to discuss the potential use of an new treatment for mild to moderate COVID-19 viral infection in non-hospitalized patients.  This patient is a 69 y.o. male that meets the FDA criteria for Emergency Use Authorization of bamlanivimab or casirivimab\imdevimab.  Has a (+) direct SARS-CoV-2 viral test result  Has mild or moderate COVID-19   Is ? 69 years of age and weighs ? 40 kg  Is NOT hospitalized due to COVID-19  Is NOT requiring oxygen therapy or requiring an increase in baseline oxygen flow rate due to COVID-19  Is within 10 days of symptom onset  Has at least one of the high risk factor(s) for progression to severe COVID-19 and/or hospitalization as defined in EUA.  Specific high risk criteria : Immunosuppressive Disease   I have spoken and communicated the following to the patient or parent/caregiver:  1. FDA has authorized the emergency use of bamlanivimab and casirivimab\imdevimab for the treatment of mild to moderate COVID-19 in adults and pediatric patients with positive results of direct SARS-CoV-2 viral testing who are 50 years of age and older weighing at least 40 kg, and who are at high risk for progressing to severe COVID-19 and/or hospitalization.  2. The significant known and potential risks and benefits of bamlanivimab and casirivimab\imdevimab, and the extent to which such potential risks and benefits are unknown.  3. Information on available alternative treatments and the risks and benefits of those alternatives, including clinical trials.  4. Patients treated with bamlanivimab and casirivimab\imdevimab should continue to self-isolate and use infection control measures (e.g., wear mask, isolate, social distance, avoid sharing personal items, clean and disinfect "high touch" surfaces, and frequent handwashing) according to CDC guidelines.   5. The patient or parent/caregiver has the option to accept or  refuse bamlanivimab or casirivimab\imdevimab .  After reviewing this information with the patient, The patient agreed to proceed with receiving the bamlanimivab infusion and will be provided a copy of the Fact sheet prior to receiving the infusion.   Pt set up for today 05/10/19 @ 12:30pm. Orders in and directions given. Sx onset 2/2.   [name] 05/10/2019 8:59 AM

## 2019-05-10 NOTE — Discharge Instructions (Signed)

## 2019-05-10 NOTE — Progress Notes (Signed)
  Diagnosis: COVID-19  Physician:Dr Joya Gaskins  Procedure: Cov[facility] Infusion Clinic Med: bamlanivimab infusion - Provided patient with bamlanimivab fact sheet for patients, parents and caregivers prior to infusion.  Complications: No immediate complications noted.  Discharge: Discharged home   Dewaine Oats 05/10/2019

## 2019-05-11 ENCOUNTER — Ambulatory Visit (HOSPITAL_COMMUNITY): Payer: Medicare Other | Admitting: Nurse Practitioner

## 2019-05-11 LAB — PATHOLOGIST SMEAR REVIEW

## 2019-05-14 LAB — CULTURE, BLOOD (ROUTINE X 2)
Culture: NO GROWTH
Special Requests: ADEQUATE

## 2019-05-25 ENCOUNTER — Inpatient Hospital Stay (HOSPITAL_BASED_OUTPATIENT_CLINIC_OR_DEPARTMENT_OTHER): Payer: Medicare Other | Admitting: Nurse Practitioner

## 2019-05-25 ENCOUNTER — Other Ambulatory Visit: Payer: Self-pay

## 2019-05-25 DIAGNOSIS — C9110 Chronic lymphocytic leukemia of B-cell type not having achieved remission: Secondary | ICD-10-CM

## 2019-05-25 NOTE — Assessment & Plan Note (Addendum)
1.  Stage II CLL by Rai system: -Initially diagnosed around 2010, he is on observation now. - He denies any fevers, night sweats, chills, or weight loss in the past 6 months.  Denies any recent infections or hospitalizations. - Physical examination deferred today due to telephone visit. - Labs on 05/04/2019 showed his WBC is 113.3, platelets 118, hemoglobin 14.5. -Patient was admitted to the hospital for Covid on 05/09/2019.  Labs done at this time showed WBC 64.0, platelets 106, hemoglobin 13.3 -No indication for treatment at this time.   We will only do CT scans if clinical condition dictates. -We will see him back in 4 months with labs.  2.Hypogammaglobulinemia: -This is from underlying CLL. -His trough immunoglobulins are remaining around 350-400. -Labs on 05/04/2019 showed his IgG 377 -He does not have any recurrent infections. -IVIG is not recommended at this time.

## 2019-05-25 NOTE — Patient Instructions (Signed)
[facility] at [facility] Discharge Instructions     Thank you for choosing [facility] at [facility] to provide your oncology and hematology care.  To afford each patient quality time with our provider, please arrive at least 15 minutes before your scheduled appointment time.   If you have a lab appointment with the Cancer Center please come in thru the Main Entrance and check in at the main information desk.  You need to re-schedule your appointment should you arrive 10 or more minutes late.  We strive to give you quality time with our providers, and arriving late affects you and other patients whose appointments are after yours.  Also, if you no show three or more times for appointments you may be dismissed from the clinic at the providers discretion.     Again, thank you for choosing [facility].  Our hope is that these requests will decrease the amount of time that you wait before being seen by our physicians.       _____________________________________________________________  Should you have questions after your visit to [facility], please contact our office at [phone] between the hours of 8:00 a.m. and 4:30 p.m.  Voicemails left after 4:00 p.m. will not be returned until the following business day.  For prescription refill requests, have your pharmacy contact our office and allow 72 hours.    Due to Covid, you will need to wear a mask upon entering the hospital. If you do not have a mask, a mask will be given to you at the Main Entrance upon arrival. For doctor visits, patients may have 1 support person with them. For treatment visits, patients can not have anyone with them due to social distancing guidelines and our immunocompromised population.      

## 2019-05-25 NOTE — Progress Notes (Signed)
Alfred Brown, [place] [address]   CLINIC:  Medical Oncology/Hematology  PCP:  [facility] [address], [place] [address] [phone]   REASON FOR VISIT: Follow-up for CLL  CURRENT THERAPY: Observation   INTERVAL HISTORY:  Alfred Brown 69 y.o. male was called for telephone visit for CLL.  Patient reports he has been in the hospital with Covid since the last time he saw [place].  He reports his energy levels are still down and he is still mildly short of breath.  However he has improved since being released from the hospital.  He denies any bright red bleeding per rectum or melena.  He denies any easy bruising or bleeding. Denies any nausea, vomiting, or diarrhea. Denies any new pains. Had not noticed any recent bleeding such as epistaxis, hematuria or hematochezia. Denies recent chest pain on exertion, shortness of breath on minimal exertion, pre-syncopal episodes, or palpitations. Denies any numbness or tingling in hands or feet. Denies any recent fevers, infections, or recent hospitalizations. Patient reports appetite at 100% and energy level at 50%.  He is eating well maintain his weight this time.    REVIEW OF SYSTEMS:  Review of Systems  Constitutional: Positive for fatigue.  Respiratory: Positive for shortness of breath.   All other systems reviewed and are negative.    PAST MEDICAL/SURGICAL HISTORY:  Past Medical History:  Diagnosis Date  . Aortic valve disorder    Mild insufficiency  . Chronic lymphocytic leukemia ([place])    01/2012: WBC-90.2, H&H-15.1/45.3, platelets-185 03/31/12: Verified with Dr. Tressie Stalker that no precautions or modification of medical regime are required prior to orthopaedic surgery.   . CLL (chronic lymphocytic leukemia) ([place])   . CMV (cytomegalovirus) ([place])   . COPD (chronic obstructive pulmonary disease) ([place])   . Depression with anxiety   . DJD (degenerative joint disease)   . GERD  (gastroesophageal reflux disease)   . Hyperlipidemia   . Lipoma    left chest wall  . Palpitations 2004   PVCs; borderline stress nuclear in 2004, negative in 2007; Echo 2007; AV-sclerotic, very mild AI  . Right bundle branch block    + left posterior fascicular block  . Tobacco abuse    80 pack years   Past Surgical History:  Procedure Laterality Date  . COLONOSCOPY  01/2012   Negative screening study  . GANGLION CYST EXCISION  1997   left wrist  . ROTATOR CUFF REPAIR  1997   right  . SHOULDER ARTHROSCOPY WITH SUBACROMIAL DECOMPRESSION  04/09/2012   Procedure: SHOULDER ARTHROSCOPY WITH SUBACROMIAL DECOMPRESSION;  Surgeon: Ninetta Lights, MD;  Location: [facility];  Service: Orthopedics;  Laterality: Left;  LEFT SHOULDER ARTHROSCOPY, SUBACROMIAL DECOMPRESSION, PARTIAL ACROMIOPLASTY WITH CORACOACROMIAL RELEASE, DISTAL CLAVICULECTOMY WITH ROTATOR CUFF REPAIR, DEBRIDEMENT OF LABRUM     SOCIAL HISTORY:  Social History   Socioeconomic History  . Marital status: Married    Spouse name: Not on file  . Number of children: Not on file  . Years of education: Not on file  . Highest education level: Not on file  Occupational History  . Not on file  Tobacco Use  . Smoking status: Current Every Day Smoker    Packs/day: 1.00    Years: 45.00    Pack years: 45.00    Types: Cigarettes  . Smokeless tobacco: Never Used  . Tobacco comment: Consumption decreased approximately 2010 to one pack per day  Substance and Sexual Activity  .  Alcohol use: No    Alcohol/week: 0.0 standard drinks  . Drug use: No  . Sexual activity: Not on file  Other Topics Concern  . Not on file  Social History Narrative  . Not on file   Social Determinants of Health   Financial Resource Strain:   . Difficulty of Paying Living Expenses: Not on file  Food Insecurity:   . Worried About Charity fundraiser in the Last Year: Not on file  . Ran Out of Food in the Last Year: Not on file   Transportation Needs:   . Lack of Transportation (Medical): Not on file  . Lack of Transportation (Non-Medical): Not on file  Physical Activity:   . Days of Exercise per Week: Not on file  . Minutes of Exercise per Session: Not on file  Stress:   . Feeling of Stress : Not on file  Social Connections:   . Frequency of Communication with Friends and Family: Not on file  . Frequency of Social Gatherings with Friends and Family: Not on file  . Attends Religious Services: Not on file  . Active Member of Clubs or Organizations: Not on file  . Attends Archivist Meetings: Not on file  . Marital Status: Not on file  Intimate Partner Violence:   . Fear of Current or Ex-Partner: Not on file  . Emotionally Abused: Not on file  . Physically Abused: Not on file  . Sexually Abused: Not on file    FAMILY HISTORY:  Family History  Problem Relation Age of Onset  . Stroke Mother   . Heart attack Father   . Cancer Sister        colon  . Colon cancer Sister   . Cancer Brother        2 brothers died with lung cancer    CURRENT MEDICATIONS:  Outpatient Encounter Medications as of 05/25/2019  Medication Sig Note  . acetaminophen (TYLENOL) 325 MG tablet Take 650 mg by mouth every 6 (six) hours as needed for mild pain, fever or headache.   . albuterol (PROVENTIL HFA;VENTOLIN HFA) 108 (90 BASE) MCG/ACT inhaler Inhale 2 puffs into the lungs every 6 (six) hours as needed for wheezing or shortness of breath.    . ALPRAZolam (XANAX) 1 MG tablet Take 1 mg by mouth 3 (three) times daily as needed for anxiety.    [name] aspirin 325 MG tablet Take 325 mg by mouth daily.     [name] buPROPion (WELLBUTRIN SR) 150 MG 12 hr tablet Take 150 mg by mouth daily.    . calcium carbonate (OS-CAL) 600 MG TABS Take 600 mg by mouth daily.     . celecoxib (CELEBREX) 100 MG capsule Take 100 mg by mouth 2 (two) times daily.   . ciprofloxacin (CIPRO) 500 MG tablet Take 500 mg by mouth 2 (two) times daily.   .  clotrimazole-betamethasone (LOTRISONE) cream Apply 1 application topically daily as needed. Affected area   . fish oil-omega-3 fatty acids 1000 MG capsule Take 1 g by mouth daily.    . fluticasone (FLONASE) 50 MCG/ACT nasal spray Place 2 sprays into the nose daily.     [name] HYDROcodone-acetaminophen (NORCO/VICODIN) 5-325 MG per tablet Take 1 tablet by mouth every 6 (six) hours as needed for moderate pain.    . Loratadine 10 MG CAPS Take 10 mg by mouth daily.    [name] lovastatin (MEVACOR) 20 MG tablet Take 20 mg by mouth daily.   . methylPREDNISolone (MEDROL DOSEPAK)  4 MG TBPK tablet Take 1-6 tablets by mouth as directed. Patient takes 6 tablets on day 1, 5 tablets on day 2, 4 tablets on day 3, 3 tablets on day 4, 2 tablets on day 5, then 1 tablet on day 6 05/09/2019: Patient finished course on 05/05/2019  . Multiple Vitamin (MULTIVITAMIN) tablet Take 1 tablet by mouth daily.     . niacin (NIASPAN) 500 MG CR tablet Take 500 mg by mouth daily.   . valACYclovir (VALTREX) 500 MG tablet Take 500 mg by mouth daily.    No facility-administered encounter medications on file as of 05/25/2019.    ALLERGIES:  No Known Allergies   Vital signs: -Deferred due to telephone visit  Physical Exam -Deferred due to telephone visit -Patient was alert and oriented over the phone and in no acute distress.   LABORATORY DATA:  I have reviewed the labs as listed.  CBC    Component Value Date/Time   WBC 64.0 (HH) 05/09/2019 1418   RBC 4.40 05/09/2019 1418   HGB 13.3 05/09/2019 1418   HCT 42.1 05/09/2019 1418   PLT 106 (L) 05/09/2019 1418   MCV 95.7 05/09/2019 1418   MCH 30.2 05/09/2019 1418   MCHC 31.6 05/09/2019 1418   RDW 13.6 05/09/2019 1418   LYMPHSABS 58.8 (H) 05/09/2019 1418   MONOABS 0.6 05/09/2019 1418   EOSABS 0.0 05/09/2019 1418   BASOSABS 0.0 05/09/2019 1418   CMP Latest Ref Rng & Units 05/09/2019 05/04/2019 10/23/2018  Glucose 70 - 99 mg/dL 112(H) 94 118(H)  BUN 8 - 23 mg/dL 31(H) 25(H) 19   Creatinine 0.61 - 1.24 mg/dL 1.04 0.85 1.10  Sodium 135 - 145 mmol/L 139 142 141  Potassium 3.5 - 5.1 mmol/L 4.2 5.0 4.2  Chloride 98 - 111 mmol/L 107 106 107  CO2 22 - 32 mmol/L 25 29 26   Calcium 8.9 - 10.3 mg/dL 8.1(L) 9.2 9.0  Total Protein 6.5 - 8.1 g/dL 5.9(L) 6.3(L) 6.4(L)  Total Bilirubin 0.3 - 1.2 mg/dL 0.6 0.6 0.8  Alkaline Phos 38 - 126 U/L 83 116 123  AST 15 - 41 U/L 25 21 20   ALT 0 - 44 U/L 18 19 16     I personally performed a face-to-face visit.  All questions were answered to patient's stated satisfaction. Encouraged patient to call with any new concerns or questions before his next visit to the cancer center and we can certain see him sooner, if needed.     ASSESSMENT & PLAN:   Chronic lymphocytic leukemia ([place]) 1.  Stage II CLL by Rai system: -Initially diagnosed around 2010, he is on observation now. - He denies any fevers, night sweats, chills, or weight loss in the past 6 months.  Denies any recent infections or hospitalizations. - Physical examination deferred today due to telephone visit. - Labs on 05/04/2019 showed his WBC is 113.3, platelets 118, hemoglobin 14.5. -Patient was admitted to the hospital for Covid on 05/09/2019.  Labs done at this time showed WBC 64.0, platelets 106, hemoglobin 13.3 -No indication for treatment at this time.   We will only do CT scans if clinical condition dictates. -We will see him back in 4 months with labs.  2.Hypogammaglobulinemia: -This is from underlying CLL. -His trough immunoglobulins are remaining around 350-400. -Labs on 05/04/2019 showed his IgG 377 -He does not have any recurrent infections. -IVIG is not recommended at this time.      Orders placed this encounter:  Orders Placed This Encounter  Procedures  .  Lactate dehydrogenase  . CBC with Differential/Platelet  . Comprehensive metabolic panel  . Vitamin B12  . VITAMIN D 25 Hydroxy (Vit-D Deficiency, Fractures)  . IgG, IgA, IgM    I provided 22 minutes of  non face-to-face telephone visit time during this encounter, and > 50% was spent counseling as documented under my assessment & plan.  Francene Finders, FNP-C [place] [phone]

## 2019-05-27 DIAGNOSIS — N486 Induration penis plastica: Secondary | ICD-10-CM | POA: Diagnosis not present

## 2019-06-02 ENCOUNTER — Emergency Department (HOSPITAL_COMMUNITY): Payer: Medicare Other

## 2019-06-02 ENCOUNTER — Encounter (HOSPITAL_COMMUNITY): Payer: Self-pay | Admitting: *Deleted

## 2019-06-02 ENCOUNTER — Inpatient Hospital Stay (HOSPITAL_COMMUNITY)
Admission: EM | Admit: 2019-06-02 | Discharge: 2019-06-04 | DRG: 177 | Disposition: A | Discharge status: Home / Self Care | Payer: Medicare Other | Source: Ambulatory Visit | Attending: Family Medicine | Admitting: Family Medicine

## 2019-06-02 ENCOUNTER — Other Ambulatory Visit: Payer: Self-pay

## 2019-06-02 DIAGNOSIS — Z79899 Other long term (current) drug therapy: Secondary | ICD-10-CM

## 2019-06-02 DIAGNOSIS — Z800 Family history of malignant neoplasm of digestive organs: Secondary | ICD-10-CM | POA: Diagnosis not present

## 2019-06-02 DIAGNOSIS — D801 Nonfamilial hypogammaglobulinemia: Secondary | ICD-10-CM | POA: Diagnosis present

## 2019-06-02 DIAGNOSIS — Z7982 Long term (current) use of aspirin: Secondary | ICD-10-CM

## 2019-06-02 DIAGNOSIS — F329 Major depressive disorder, single episode, unspecified: Secondary | ICD-10-CM | POA: Diagnosis present

## 2019-06-02 DIAGNOSIS — F17210 Nicotine dependence, cigarettes, uncomplicated: Secondary | ICD-10-CM | POA: Diagnosis present

## 2019-06-02 DIAGNOSIS — J9621 Acute and chronic respiratory failure with hypoxia: Secondary | ICD-10-CM | POA: Diagnosis present

## 2019-06-02 DIAGNOSIS — Z801 Family history of malignant neoplasm of trachea, bronchus and lung: Secondary | ICD-10-CM | POA: Diagnosis not present

## 2019-06-02 DIAGNOSIS — J440 Chronic obstructive pulmonary disease with acute lower respiratory infection: Secondary | ICD-10-CM | POA: Diagnosis present

## 2019-06-02 DIAGNOSIS — J9600 Acute respiratory failure, unspecified whether with hypoxia or hypercapnia: Secondary | ICD-10-CM | POA: Diagnosis not present

## 2019-06-02 DIAGNOSIS — C9190 Lymphoid leukemia, unspecified not having achieved remission: Secondary | ICD-10-CM | POA: Diagnosis not present

## 2019-06-02 DIAGNOSIS — J1282 Pneumonia due to coronavirus disease 2019: Secondary | ICD-10-CM | POA: Diagnosis present

## 2019-06-02 DIAGNOSIS — E785 Hyperlipidemia, unspecified: Secondary | ICD-10-CM | POA: Diagnosis present

## 2019-06-02 DIAGNOSIS — R0902 Hypoxemia: Secondary | ICD-10-CM | POA: Diagnosis not present

## 2019-06-02 DIAGNOSIS — F419 Anxiety disorder, unspecified: Secondary | ICD-10-CM | POA: Diagnosis present

## 2019-06-02 DIAGNOSIS — U071 COVID-19: Secondary | ICD-10-CM | POA: Diagnosis present

## 2019-06-02 DIAGNOSIS — J441 Chronic obstructive pulmonary disease with (acute) exacerbation: Secondary | ICD-10-CM | POA: Diagnosis present

## 2019-06-02 DIAGNOSIS — K219 Gastro-esophageal reflux disease without esophagitis: Secondary | ICD-10-CM | POA: Diagnosis present

## 2019-06-02 DIAGNOSIS — J189 Pneumonia, unspecified organism: Secondary | ICD-10-CM | POA: Diagnosis not present

## 2019-06-02 DIAGNOSIS — C9110 Chronic lymphocytic leukemia of B-cell type not having achieved remission: Secondary | ICD-10-CM | POA: Diagnosis not present

## 2019-06-02 DIAGNOSIS — Z823 Family history of stroke: Secondary | ICD-10-CM

## 2019-06-02 DIAGNOSIS — R509 Fever, unspecified: Secondary | ICD-10-CM | POA: Diagnosis not present

## 2019-06-02 DIAGNOSIS — I10 Essential (primary) hypertension: Secondary | ICD-10-CM | POA: Diagnosis not present

## 2019-06-02 DIAGNOSIS — Z8616 Personal history of COVID-19: Secondary | ICD-10-CM | POA: Diagnosis present

## 2019-06-02 DIAGNOSIS — Z1389 Encounter for screening for other disorder: Secondary | ICD-10-CM | POA: Diagnosis not present

## 2019-06-02 DIAGNOSIS — Z6823 Body mass index (BMI) 23.0-23.9, adult: Secondary | ICD-10-CM | POA: Diagnosis not present

## 2019-06-02 DIAGNOSIS — Z8249 Family history of ischemic heart disease and other diseases of the circulatory system: Secondary | ICD-10-CM

## 2019-06-02 DIAGNOSIS — R0602 Shortness of breath: Secondary | ICD-10-CM | POA: Diagnosis not present

## 2019-06-02 DIAGNOSIS — J449 Chronic obstructive pulmonary disease, unspecified: Secondary | ICD-10-CM | POA: Diagnosis not present

## 2019-06-02 LAB — COMPREHENSIVE METABOLIC PANEL
ALT: 14 U/L (ref 0–44)
AST: 22 U/L (ref 15–41)
Albumin: 3.6 g/dL (ref 3.5–5.0)
Alkaline Phosphatase: 102 U/L (ref 38–126)
Anion gap: 9 (ref 5–15)
BUN: 24 mg/dL — ABNORMAL HIGH (ref 8–23)
CO2: 25 mmol/L (ref 22–32)
Calcium: 8.9 mg/dL (ref 8.9–10.3)
Chloride: 106 mmol/L (ref 98–111)
Creatinine, Ser: 1.07 mg/dL (ref 0.61–1.24)
GFR calc Af Amer: >60 mL/min (ref >60)
GFR calc non Af Amer: >60 mL/min (ref >60)
Glucose, Bld: 110 mg/dL — ABNORMAL HIGH (ref 70–99)
Potassium: 4.4 mmol/L (ref 3.5–5.1)
Sodium: 140 mmol/L (ref 135–145)
Total Bilirubin: 0.6 mg/dL (ref 0.3–1.2)
Total Protein: 6.3 g/dL — ABNORMAL LOW (ref 6.5–8.1)

## 2019-06-02 LAB — CBC WITH DIFFERENTIAL/PLATELET
Abs Immature Granulocytes: 0.18 10*3/uL — ABNORMAL HIGH (ref 0.00–0.07)
Basophils Absolute: 0.1 10*3/uL (ref 0.0–0.1)
Basophils Relative: 0 %
Eosinophils Absolute: 0.2 10*3/uL (ref 0.0–0.5)
Eosinophils Relative: 0 %
HCT: 43.5 % (ref 39.0–52.0)
Hemoglobin: 13.4 g/dL (ref 13.0–17.0)
Immature Granulocytes: 0 %
Lymphocytes Relative: 91 %
Lymphs Abs: 79.2 10*3/uL — ABNORMAL HIGH (ref 0.7–4.0)
MCH: 29.5 pg (ref 26.0–34.0)
MCHC: 30.8 g/dL (ref 30.0–36.0)
MCV: 95.8 fL (ref 80.0–100.0)
Monocytes Absolute: 1.3 10*3/uL — ABNORMAL HIGH (ref 0.1–1.0)
Monocytes Relative: 2 %
Neutro Abs: 5.5 10*3/uL (ref 1.7–7.7)
Neutrophils Relative %: 7 %
Platelets: 168 10*3/uL (ref 150–400)
RBC: 4.54 MIL/uL (ref 4.22–5.81)
RDW: 14.1 % (ref 11.5–15.5)
WBC: 86.6 10*3/uL (ref 4.0–10.5)
nRBC: 0 % (ref 0.0–0.2)

## 2019-06-02 MED ORDER — ALBUTEROL SULFATE HFA 108 (90 BASE) MCG/ACT IN AERS: 4.0000 | INHALATION_SPRAY | RESPIRATORY_TRACT | Status: DC | PRN

## 2019-06-02 MED ORDER — SODIUM CHLORIDE 0.9 % IV SOLN
100.0000 mg | Freq: Every day | INTRAVENOUS | Status: DC
  Administered 2019-06-03: 100 mg via INTRAVENOUS
  Filled 2019-06-02 (×2): qty 20

## 2019-06-02 MED ORDER — SODIUM CHLORIDE 0.9 % IV SOLN
100.0000 mg | INTRAVENOUS | Status: AC
  Administered 2019-06-02 – 2019-06-03 (×2): 100 mg via INTRAVENOUS
  Filled 2019-06-02 (×2): qty 20

## 2019-06-02 MED ORDER — IOHEXOL 350 MG/ML SOLN
100.0000 mL | Freq: Once | INTRAVENOUS | Status: AC | PRN
  Administered 2019-06-02: 100 mL via INTRAVENOUS

## 2019-06-02 NOTE — ED Triage Notes (Signed)
Pt was diagnosed with covid three weeks ago, was seen at Dr Nolon Rod office today and told to come to er for chest xray, pt also reports that he was given "some type of shot"

## 2019-06-02 NOTE — ED Notes (Signed)
Updated plan of care w/wife  Placed pt sticker on personal wheelchair, wheeled to ED entrance for wife to pick up   Pt given crackers & sprite

## 2019-06-02 NOTE — ED Notes (Signed)
Attempted to update spouse. Called twice. No response at this time.

## 2019-06-02 NOTE — H&P (Signed)
TRH H&P    Patient Demographics:    Alfred Brown, is a 69 y.o. male  MRN: [id number]  DOB - February 01, 1951  Admit Date - 06/02/2019  Referring MD/NP/PA: Dr. Roderic Palau  Outpatient Primary MD for the patient is Redmond School, MD  Patient coming from: Home  Chief complaint-shortness of breath   HPI:    Alfred Brown  is a 69 y.o. male, with history of CLL, GERD, hyperlipidemia, COPD who was diagnosed with COVID-19 infection 3 weeks ago, and received outpatient IV monoclonal antibody at [facility].  Patient today went to his primary care physician for fever and shortness of breath.  Chest x-ray was consistent with pneumonia.  Patient was sent to ED for further evaluation.  In the ED CT chest showed multifocal pneumonia, worrisome for viral pneumonia. Patient has history of CLL, WBC count is 86,000.  He is followed by hematology/oncology at [facility] as outpatient. He denies nausea vomiting or diarrhea. Patient said that he started having fever last week Also complains of shortness of breath. In the ED patient was put on 2 L/min of oxygen He denies abdominal pain, dysuria   Review of systems:    In addition to the HPI above,    All other systems reviewed and are negative.    Past History of the following :    Past Medical History:  Diagnosis Date  . Aortic valve disorder    Mild insufficiency  . Chronic lymphocytic leukemia ([place])    01/2012: WBC-90.2, H&H-15.1/45.3, platelets-185 03/31/12: Verified with Dr. Tressie Stalker that no precautions or modification of medical regime are required prior to orthopaedic surgery.   . CLL (chronic lymphocytic leukemia) ([place])   . CMV (cytomegalovirus) ([place])   . COPD (chronic obstructive pulmonary disease) ([place])   . Depression with anxiety   . DJD (degenerative joint disease)   . GERD (gastroesophageal reflux disease)   . Hyperlipidemia   . Lipoma    left chest wall  .  Palpitations 2004   PVCs; borderline stress nuclear in 2004, negative in 2007; Echo 2007; AV-sclerotic, very mild AI  . Right bundle branch block    + left posterior fascicular block  . Tobacco abuse    80 pack years      Past Surgical History:  Procedure Laterality Date  . COLONOSCOPY  01/2012   Negative screening study  . GANGLION CYST EXCISION  1997   left wrist  . ROTATOR CUFF REPAIR  1997   right  . SHOULDER ARTHROSCOPY WITH SUBACROMIAL DECOMPRESSION  04/09/2012   Procedure: SHOULDER ARTHROSCOPY WITH SUBACROMIAL DECOMPRESSION;  Surgeon: Ninetta Lights, MD;  Location: [facility];  Service: Orthopedics;  Laterality: Left;  LEFT SHOULDER ARTHROSCOPY, SUBACROMIAL DECOMPRESSION, PARTIAL ACROMIOPLASTY WITH CORACOACROMIAL RELEASE, DISTAL CLAVICULECTOMY WITH ROTATOR CUFF REPAIR, DEBRIDEMENT OF LABRUM      Social History:      Social History   Tobacco Use  . Smoking status: Current Every Day Smoker    Packs/day: 1.00    Years: 45.00    Pack years: 45.00  Types: Cigarettes  . Smokeless tobacco: Never Used  . Tobacco comment: Consumption decreased approximately 2010 to one pack per day  Substance Use Topics  . Alcohol use: No    Alcohol/week: 0.0 standard drinks       Family History :     Family History  Problem Relation Age of Onset  . Stroke Mother   . Heart attack Father   . Cancer Sister        colon  . Colon cancer Sister   . Cancer Brother        2 brothers died with lung cancer      Home Medications:   Prior to Admission medications   Medication Sig Start Date End Date Taking? Authorizing Provider  acetaminophen (TYLENOL) 325 MG tablet Take 650 mg by mouth every 6 (six) hours as needed for mild pain, fever or headache.    [provider]  albuterol (PROVENTIL HFA;VENTOLIN HFA) 108 (90 BASE) MCG/ACT inhaler Inhale 2 puffs into the lungs every 6 (six) hours as needed for wheezing or shortness of breath.     [provider]    ALPRAZolam Duanne Moron) 1 MG tablet Take 1 mg by mouth 3 (three) times daily as needed for anxiety.     [provider]  aspirin 325 MG tablet Take 325 mg by mouth daily.      [provider]  buPROPion (WELLBUTRIN SR) 150 MG 12 hr tablet Take 150 mg by mouth daily.     [provider]  calcium carbonate (OS-CAL) 600 MG TABS Take 600 mg by mouth daily.      [provider]  celecoxib (CELEBREX) 100 MG capsule Take 100 mg by mouth 2 (two) times daily.    [provider]  ciprofloxacin (CIPRO) 500 MG tablet Take 500 mg by mouth 2 (two) times daily. 05/06/19   [provider]  clotrimazole-betamethasone (LOTRISONE) cream Apply 1 application topically daily as needed. Affected area 06/19/15   [provider]  fish oil-omega-3 fatty acids 1000 MG capsule Take 1 g by mouth daily.     [provider]  fluticasone (FLONASE) 50 MCG/ACT nasal spray Place 2 sprays into the nose daily.      [provider]  HYDROcodone-acetaminophen (NORCO/VICODIN) 5-325 MG per tablet Take 1 tablet by mouth every 6 (six) hours as needed for moderate pain.  08/10/14   [provider]  Loratadine 10 MG CAPS Take 10 mg by mouth daily.     [provider]  lovastatin (MEVACOR) 20 MG tablet Take 20 mg by mouth daily. 09/29/14   [provider]  methylPREDNISolone (MEDROL DOSEPAK) 4 MG TBPK tablet Take 1-6 tablets by mouth as directed. Patient takes 6 tablets on day 1, 5 tablets on day 2, 4 tablets on day 3, 3 tablets on day 4, 2 tablets on day 5, then 1 tablet on day 6 04/29/19   [provider]  Multiple Vitamin (MULTIVITAMIN) tablet Take 1 tablet by mouth daily.      [provider]  niacin (NIASPAN) 500 MG CR tablet Take 500 mg by mouth daily. 09/29/14   [provider]  pentoxifylline (TRENTAL) 400 MG CR tablet Take 400 mg by mouth 3 (three) times daily. 05/27/19   [provider]  valACYclovir  (VALTREX) 500 MG tablet Take 500 mg by mouth daily. 03/29/19   [provider]     Allergies:    No Known Allergies   Physical Exam:  Vitals  Blood pressure (!) 149/76, pulse (!) 105, temperature 98.1 F (36.7 C), temperature source Oral, resp. rate 20, height 6\' 1"  (1.854 m), weight 78 kg, SpO2 93 %.  1.  General: Appears in no acute distress  2. Psychiatric: Alert, oriented x3, intact insight and judgment  3. Neurologic: Cranial nerves II through XII grossly intact, no focal deficit noted  4. HEENMT:  Atraumatic normocephalic, extraocular muscles are intact  5. Respiratory : Clear to auscultation bilaterally, no wheezing or crackles auscultated  6. Cardiovascular : S1-S2, regular, no murmur auscultated  7. Gastrointestinal:  Abdomen is soft, nontender, no organomegaly      Data Review:    CBC Recent Labs  Lab 06/02/19 1537  WBC 86.6*  HGB 13.4  HCT 43.5  PLT 168  MCV 95.8  MCH 29.5  MCHC 30.8  RDW 14.1  LYMPHSABS 79.2*  MONOABS 1.3*  EOSABS 0.2  BASOSABS 0.1   ------------------------------------------------------------------------------------------------------------------  Results for orders placed or performed during the hospital encounter of 06/02/19 (from the past 48 hour(s))  CBC with Differential/Platelet     Status: Abnormal   Collection Time: 06/02/19  3:37 PM  Result Value Ref Range   WBC 86.6 (HH) 4.0 - 10.5 K/uL    Comment: REPEATED TO VERIFY WHITE COUNT CONFIRMED ON SMEAR THIS CRITICAL RESULT HAS VERIFIED AND BEEN CALLED TO T JACKSON BY [facility] FLYNT ON 03 03 2021 AT 1600, AND HAS BEEN READ BACK.  CALLED TO DAWN HENRY AT 0865 BY HFLYNT 06/02/19 CORRECTED ON 03/03 AT 1610: PREVIOUSLY REPORTED AS 86.6 REPEATED TO VERIFY WHITE COUNT CONFIRMED ON SMEAR THIS CRITICAL RESULT HAS VERIFIED AND BEEN CALLED TO T JACKSON BY [facility] FLYNT ON 03 03 2021 AT 1600, AND HAS BEEN READ BACK.     RBC 4.54 4.22 - 5.81 MIL/uL   Hemoglobin 13.4  13.0 - 17.0 g/dL   HCT 43.5 39.0 - 52.0 %   MCV 95.8 80.0 - 100.0 fL   MCH 29.5 26.0 - 34.0 pg   MCHC 30.8 30.0 - 36.0 g/dL   RDW 14.1 11.5 - 15.5 %   Platelets 168 150 - 400 K/uL   nRBC 0.0 0.0 - 0.2 %   Neutrophils Relative % 7 %   Neutro Abs 5.5 1.7 - 7.7 K/uL   Lymphocytes Relative 91 %   Lymphs Abs 79.2 (H) 0.7 - 4.0 K/uL   Monocytes Relative 2 %   Monocytes Absolute 1.3 (H) 0.1 - 1.0 K/uL   Eosinophils Relative 0 %   Eosinophils Absolute 0.2 0.0 - 0.5 K/uL   Basophils Relative 0 %   Basophils Absolute 0.1 0.0 - 0.1 K/uL   WBC Morphology ABSOLUTE LYMPHOCYTOSIS    Immature Granulocytes 0 %   Abs Immature Granulocytes 0.18 (H) 0.00 - 0.07 K/uL   Reactive, Benign Lymphocytes PRESENT    Smudge Cells PRESENT     Comment: Performed at [facility], [address] [place], [place] [address]  Comprehensive metabolic panel     Status: Abnormal   Collection Time: 06/02/19  3:37 PM  Result Value Ref Range   Sodium 140 135 - 145 mmol/L   Potassium 4.4 3.5 - 5.1 mmol/L   Chloride 106 98 - 111 mmol/L   CO2 25 22 - 32 mmol/L   Glucose, Bld 110 (H) 70 - 99 mg/dL    Comment: Glucose reference range applies only to samples taken after fasting for at least 8 hours.   BUN 24 (H) 8 - 23 mg/dL  Creatinine, Ser 1.07 0.61 - 1.24 mg/dL   Calcium 8.9 8.9 - 10.3 mg/dL   Total Protein 6.3 (L) 6.5 - 8.1 g/dL   Albumin 3.6 3.5 - 5.0 g/dL   AST 22 15 - 41 U/L   ALT 14 0 - 44 U/L   Alkaline Phosphatase 102 38 - 126 U/L   Total Bilirubin 0.6 0.3 - 1.2 mg/dL   GFR calc non Af Amer >60 >60 mL/min   GFR calc Af Amer >60 >60 mL/min   Anion gap 9 5 - 15    Comment: Performed at [facility], [address], [place], [place] [address]    Chemistries  Recent Labs  Lab 06/02/19 1537  NA 140  K 4.4  CL 106  CO2 25  GLUCOSE 110*  BUN 24*  CREATININE 1.07  CALCIUM 8.9  AST 22  ALT 14  ALKPHOS 102  BILITOT 0.6    ------------------------------------------------------------------------------------------------------------------  ------------------------------------------------------------------------------------------------------------------ GFR: Estimated Creatinine Clearance: 73.9 mL/min (by C-G formula based on SCr of 1.07 mg/dL). Liver Function Tests: Recent Labs  Lab 06/02/19 1537  AST 22  ALT 14  ALKPHOS 102  BILITOT 0.6  PROT 6.3*  ALBUMIN 3.6    --------------------------------------------------------------------------------------------------------------- Urine analysis:    Component Value Date/Time   COLORURINE YELLOW 05/09/2019 1419   APPEARANCEUR CLEAR 05/09/2019 1419   LABSPEC 1.028 05/09/2019 1419   PHURINE 6.0 05/09/2019 1419   GLUCOSEU NEGATIVE 05/09/2019 1419   HGBUR NEGATIVE 05/09/2019 1419   BILIRUBINUR NEGATIVE 05/09/2019 1419   KETONESUR NEGATIVE 05/09/2019 1419   PROTEINUR 30 (A) 05/09/2019 1419   NITRITE NEGATIVE 05/09/2019 1419   LEUKOCYTESUR NEGATIVE 05/09/2019 1419      Imaging Results:    CT Angio Chest PE W and/or Wo Contrast  Result Date: 06/02/2019 CLINICAL DATA:  Shortness of breath. EXAM: CT ANGIOGRAPHY CHEST WITH CONTRAST TECHNIQUE: Multidetector CT imaging of the chest was performed using the standard protocol during bolus administration of intravenous contrast. Multiplanar CT image reconstructions and MIPs were obtained to evaluate the vascular anatomy. CONTRAST:  153mL OMNIPAQUE IOHEXOL 350 MG/ML SOLN COMPARISON:  October 28, 2011 FINDINGS: Cardiovascular: Evaluation is limited by respiratory motion artifact.There is no pulmonary embolus. The main pulmonary artery is within normal limits for size. There is no CT evidence of acute right heart strain. Atherosclerotic changes are noted of the thoracic aorta without evidence for an aneurysm. Heart size is normal. Coronary artery calcifications are noted. There is no significant pericardial effusion.  Mediastinum/Nodes: --mildly enlarged mediastinal and hilar lymph nodes are noted. For example there is a precarinal lymph node measuring approximately 1.5 cm. This is increased from prior study in 2013 but is still favored to represent a reactive lymph node. --there are mildly enlarged axillary lymph nodes which are relatively stable when compared to 2013 and are likely of no clinical significance. --No supraclavicular lymphadenopathy. --Normal thyroid gland. --The esophagus is unremarkable Lungs/Pleura: There are diffuse hazy ground-glass coarse airspace opacities bilaterally consistent with the patient's history of viral pneumonia. There is no pneumothorax. No large pleural effusion. The trachea is unremarkable. Upper Abdomen: The spleen appears to be at least mildly enlarged. The visualized is upper abdomen is grossly unremarkable. Musculoskeletal: No chest wall abnormality. No acute or significant osseous findings. Again noted are mildly enlarged lymph nodes in the region of the porta hepatis. Review of the MIP images confirms the above findings. IMPRESSION: 1. Evaluation for pulmonary emboli is limited by respiratory motion artifact. Given this limitation, no acute pulmonary embolism was detected.  2. Diffuse bilateral ground-glass airspace opacities consistent with the patient's history of COVID-19 pneumonia. 3. Probable borderline splenomegaly. Again noted are enlarged lymph nodes in the porta hepatis, similar to CT from 2017. 4. Presumed reactive mediastinal adenopathy is noted. Aortic Atherosclerosis (ICD10-I70.0). Electronically Signed   By: Constance Holster M.D.   On: 06/02/2019 17:18   DG Chest Port 1 View  Result Date: 06/02/2019 CLINICAL DATA:  Shortness of breath EXAM: PORTABLE CHEST 1 VIEW COMPARISON:  05/09/2019 FINDINGS: Again noted are diffuse bilateral airspace opacities with some interval progression in the left mid and left lower lung zone. There is no pneumothorax. No large pleural  effusion. The heart size is stable. Aortic calcifications are noted. IMPRESSION: Interval worsening of multifocal airspace opacities, most evident in the left mid and left lower lung zones. Findings are concerning for multifocal pneumonia (viral or bacterial). Electronically Signed   By: Constance Holster M.D.   On: 06/02/2019 16:13    My personal review of EKG: Rhythm NSR, no ST-T changes   Assessment & Plan:    Active Problems:   Pneumonia due to COVID-19 virus   1. Pneumonia due to COVID-19 virus-patient's chest x-ray as well as CT chest consistent with multifocal pneumonia.  Likely viral in origin.  Will obtain procalcitonin, CRP, D-dimer, ferritin.  Start remdesivir per pharmacy consultation, Decadron 6 mg p.o. daily.  Albuterol inhaler 2 puffs every 4 hours as needed for dyspnea.  Patient is currently requiring 2 L/min of oxygen. 2. Chronic lymphocytic leukemia-WBC is 86,000, platelet count 168.  Patient is followed up by hematology/oncology as outpatient. 3. Hyperlipidemia-continue statin 4. Anxiety/depression-continue Xanax 1 mg 3 times daily as needed, Wellbutrin 150 mg p.o. daily.    DVT Prophylaxis-   Lovenox   AM Labs Ordered, also please review Full Orders  Family Communication: Admission, patients condition and plan of care including tests being ordered have been discussed with the patient who indicate understanding and agree with the plan and Code Status.  Code Status: Full code  Admission status: :The appropriate admission status for this patient is INPATIENT. Inpatient status is judged to be reasonable and necessary in order to provide the required intensity of service to ensure the patient's safety. The patient's presenting symptoms, physical exam findings, and initial radiographic and laboratory data in the context of their chronic comorbidities is felt to place them at high risk for further clinical deterioration. Furthermore, it is not anticipated that the patient will  be medically stable for discharge from the hospital within 2 midnights of admission. The following factors support the admission status of inpatient.     The patient's presenting symptoms include shortness of breath The worrisome physical exam findings include hypoxia, requiring 2 L/min of oxygen, COVID-19 positive, chest x-ray and CT scan chest worrisome for viral pneumonia. The initial radiographic and laboratory data are worrisome because of CT chest shows multifocal pneumonia. The chronic co-morbidities include CLL.       * I certify that at the point of admission it is my clinical judgment that the patient will require inpatient hospital care spanning beyond 2 midnights from the point of admission due to high intensity of service, high risk for further deterioration and high frequency of surveillance required.*  Time spent in minutes : 60 minutes   Jabril Pursell S Ellayna Hilligoss M.D

## 2019-06-02 NOTE — ED Provider Notes (Signed)
[facility] Provider Note   CSN: [id number] Arrival date & time: 06/02/19  1402     History Chief Complaint  Patient presents with  . Cough    Alfred Brown is a 69 y.o. male.  Patient complains of shortness of breath.  He was diagnosed with Covid over 3 weeks ago.  Patient also has COPD  The history is provided by the patient. No language interpreter was used.  Cough Cough characteristics:  Non-productive Sputum characteristics:  Unable to specify Severity:  Moderate Onset quality:  Sudden Timing:  Constant Progression:  Worsening Chronicity:  Recurrent Smoker: no   Context: not animal exposure   Associated symptoms: shortness of breath   Associated symptoms: no chest pain, no eye discharge, no headaches and no rash        Past Medical History:  Diagnosis Date  . Aortic valve disorder    Mild insufficiency  . Chronic lymphocytic leukemia ([place])    01/2012: WBC-90.2, H&H-15.1/45.3, platelets-185 03/31/12: Verified with Dr. Tressie Stalker that no precautions or modification of medical regime are required prior to orthopaedic surgery.   . CLL (chronic lymphocytic leukemia) ([place])   . CMV (cytomegalovirus) ([place])   . COPD (chronic obstructive pulmonary disease) ([place])   . Depression with anxiety   . DJD (degenerative joint disease)   . GERD (gastroesophageal reflux disease)   . Hyperlipidemia   . Lipoma    left chest wall  . Palpitations 2004   PVCs; borderline stress nuclear in 2004, negative in 2007; Echo 2007; AV-sclerotic, very mild AI  . Right bundle branch block    + left posterior fascicular block  . Tobacco abuse    80 pack years    Patient Active Problem List   Diagnosis Date Noted  . GERD (gastroesophageal reflux disease)   . Hyperlipidemia   . Palpitations   . Chronic lymphocytic leukemia ([place])   . Aortic valve disorder   . Right bundle branch block   . Tobacco abuse     Past Surgical History:  Procedure Laterality Date  .  COLONOSCOPY  01/2012   Negative screening study  . GANGLION CYST EXCISION  1997   left wrist  . ROTATOR CUFF REPAIR  1997   right  . SHOULDER ARTHROSCOPY WITH SUBACROMIAL DECOMPRESSION  04/09/2012   Procedure: SHOULDER ARTHROSCOPY WITH SUBACROMIAL DECOMPRESSION;  Surgeon: Ninetta Lights, MD;  Location: [facility];  Service: Orthopedics;  Laterality: Left;  LEFT SHOULDER ARTHROSCOPY, SUBACROMIAL DECOMPRESSION, PARTIAL ACROMIOPLASTY WITH CORACOACROMIAL RELEASE, DISTAL CLAVICULECTOMY WITH ROTATOR CUFF REPAIR, DEBRIDEMENT OF LABRUM       Family History  Problem Relation Age of Onset  . Stroke Mother   . Heart attack Father   . Cancer Sister        colon  . Colon cancer Sister   . Cancer Brother        2 brothers died with lung cancer    Social History   Tobacco Use  . Smoking status: Current Every Day Smoker    Packs/day: 1.00    Years: 45.00    Pack years: 45.00    Types: Cigarettes  . Smokeless tobacco: Never Used  . Tobacco comment: Consumption decreased approximately 2010 to one pack per day  Substance Use Topics  . Alcohol use: No    Alcohol/week: 0.0 standard drinks  . Drug use: No    Home Medications Prior to Admission medications   Medication Sig Start Date End Date Taking? Authorizing Provider  acetaminophen (TYLENOL) 325 MG tablet Take 650 mg by mouth every 6 (six) hours as needed for mild pain, fever or headache.    [provider]  albuterol (PROVENTIL HFA;VENTOLIN HFA) 108 (90 BASE) MCG/ACT inhaler Inhale 2 puffs into the lungs every 6 (six) hours as needed for wheezing or shortness of breath.     [provider]  ALPRAZolam Duanne Moron) 1 MG tablet Take 1 mg by mouth 3 (three) times daily as needed for anxiety.     [provider]  aspirin 325 MG tablet Take 325 mg by mouth daily.      [provider]  buPROPion (WELLBUTRIN SR) 150 MG 12 hr tablet Take 150 mg by mouth daily.     [provider]  calcium  carbonate (OS-CAL) 600 MG TABS Take 600 mg by mouth daily.      [provider]  celecoxib (CELEBREX) 100 MG capsule Take 100 mg by mouth 2 (two) times daily.    [provider]  ciprofloxacin (CIPRO) 500 MG tablet Take 500 mg by mouth 2 (two) times daily. 05/06/19   [provider]  clotrimazole-betamethasone (LOTRISONE) cream Apply 1 application topically daily as needed. Affected area 06/19/15   [provider]  fish oil-omega-3 fatty acids 1000 MG capsule Take 1 g by mouth daily.     [provider]  fluticasone (FLONASE) 50 MCG/ACT nasal spray Place 2 sprays into the nose daily.      [provider]  HYDROcodone-acetaminophen (NORCO/VICODIN) 5-325 MG per tablet Take 1 tablet by mouth every 6 (six) hours as needed for moderate pain.  08/10/14   [provider]  Loratadine 10 MG CAPS Take 10 mg by mouth daily.     [provider]  lovastatin (MEVACOR) 20 MG tablet Take 20 mg by mouth daily. 09/29/14   [provider]  methylPREDNISolone (MEDROL DOSEPAK) 4 MG TBPK tablet Take 1-6 tablets by mouth as directed. Patient takes 6 tablets on day 1, 5 tablets on day 2, 4 tablets on day 3, 3 tablets on day 4, 2 tablets on day 5, then 1 tablet on day 6 04/29/19   [provider]  Multiple Vitamin (MULTIVITAMIN) tablet Take 1 tablet by mouth daily.      [provider]  niacin (NIASPAN) 500 MG CR tablet Take 500 mg by mouth daily. 09/29/14   [provider]  pentoxifylline (TRENTAL) 400 MG CR tablet Take 400 mg by mouth 3 (three) times daily. 05/27/19   [provider]  valACYclovir (VALTREX) 500 MG tablet Take 500 mg by mouth daily. 03/29/19   [provider]    Allergies    Patient has no known allergies.  Review of Systems   Review of Systems  Constitutional: Negative for appetite change and fatigue.  HENT: Negative for congestion, ear discharge and sinus pressure.   Eyes: Negative  for discharge.  Respiratory: Positive for cough and shortness of breath.   Cardiovascular: Negative for chest pain.  Gastrointestinal: Negative for abdominal pain and diarrhea.  Genitourinary: Negative for frequency and hematuria.  Musculoskeletal: Negative for back pain.  Skin: Negative for rash.  Neurological: Negative for seizures and headaches.  Psychiatric/Behavioral: Negative for hallucinations.    Physical Exam Updated Vital Signs BP (!) 149/76   Pulse (!) 105   Temp 98.1 F (36.7 C) (Oral)   Resp 20   Ht 6\' 1"  (1.854 m)   Wt 78 kg   SpO2 93%   BMI 22.69  kg/m   Physical Exam Vitals and nursing note reviewed.  Constitutional:      Appearance: He is well-developed.  HENT:     Head: Normocephalic.     Nose: Nose normal.  Eyes:     General: No scleral icterus.    Conjunctiva/sclera: Conjunctivae normal.  Neck:     Thyroid: No thyromegaly.  Cardiovascular:     Rate and Rhythm: Normal rate and regular rhythm.     Heart sounds: No murmur. No friction rub. No gallop.   Pulmonary:     Breath sounds: No stridor. Wheezing present. No rales.  Chest:     Chest wall: No tenderness.  Abdominal:     General: There is no distension.     Tenderness: There is no abdominal tenderness. There is no rebound.  Musculoskeletal:        General: Normal range of motion.     Cervical back: Neck supple.  Lymphadenopathy:     Cervical: No cervical adenopathy.  Skin:    Findings: No erythema or rash.  Neurological:     Mental Status: He is alert and oriented to person, place, and time.     Motor: No abnormal muscle tone.     Coordination: Coordination normal.  Psychiatric:        Behavior: Behavior normal.     ED Results / Procedures / Treatments   Labs (all labs ordered are listed, but only abnormal results are displayed) Labs Reviewed  CBC WITH DIFFERENTIAL/PLATELET - Abnormal; Notable for the following components:      Result Value   WBC 86.6 (*)    Lymphs Abs 79.2 (*)     Monocytes Absolute 1.3 (*)    Abs Immature Granulocytes 0.18 (*)    All other components within normal limits  COMPREHENSIVE METABOLIC PANEL - Abnormal; Notable for the following components:   Glucose, Bld 110 (*)    BUN 24 (*)    Total Protein 6.3 (*)    All other components within normal limits    EKG EKG Interpretation  Date/Time:  Wednesday June 02 2019 15:02:02 EST Ventricular Rate:  90 PR Interval:    QRS Duration: 149 QT Interval:  399 QTC Calculation: 489 R Axis:   111 Text Interpretation: Sinus rhythm Consider right atrial enlargement RBBB and LPFB Baseline wander in lead(s) V3 V4 V5 Confirmed by Milton Ferguson [phone]) on 06/02/2019 8:42:49 PM   Radiology CT Angio Chest PE W and/or Wo Contrast  Result Date: 06/02/2019 CLINICAL DATA:  Shortness of breath. EXAM: CT ANGIOGRAPHY CHEST WITH CONTRAST TECHNIQUE: Multidetector CT imaging of the chest was performed using the standard protocol during bolus administration of intravenous contrast. Multiplanar CT image reconstructions and MIPs were obtained to evaluate the vascular anatomy. CONTRAST:  168mL OMNIPAQUE IOHEXOL 350 MG/ML SOLN COMPARISON:  October 28, 2011 FINDINGS: Cardiovascular: Evaluation is limited by respiratory motion artifact.There is no pulmonary embolus. The main pulmonary artery is within normal limits for size. There is no CT evidence of acute right heart strain. Atherosclerotic changes are noted of the thoracic aorta without evidence for an aneurysm. Heart size is normal. Coronary artery calcifications are noted. There is no significant pericardial effusion. Mediastinum/Nodes: --mildly enlarged mediastinal and hilar lymph nodes are noted. For example there is a precarinal lymph node measuring approximately 1.5 cm. This is increased from prior study in 2013 but is still favored to represent a reactive lymph node. --there are mildly enlarged axillary lymph nodes which are relatively stable when compared to 2013  and are  likely of no clinical significance. --No supraclavicular lymphadenopathy. --Normal thyroid gland. --The esophagus is unremarkable Lungs/Pleura: There are diffuse hazy ground-glass coarse airspace opacities bilaterally consistent with the patient's history of viral pneumonia. There is no pneumothorax. No large pleural effusion. The trachea is unremarkable. Upper Abdomen: The spleen appears to be at least mildly enlarged. The visualized is upper abdomen is grossly unremarkable. Musculoskeletal: No chest wall abnormality. No acute or significant osseous findings. Again noted are mildly enlarged lymph nodes in the region of the porta hepatis. Review of the MIP images confirms the above findings. IMPRESSION: 1. Evaluation for pulmonary emboli is limited by respiratory motion artifact. Given this limitation, no acute pulmonary embolism was detected. 2. Diffuse bilateral ground-glass airspace opacities consistent with the patient's history of COVID-19 pneumonia. 3. Probable borderline splenomegaly. Again noted are enlarged lymph nodes in the porta hepatis, similar to CT from 2017. 4. Presumed reactive mediastinal adenopathy is noted. Aortic Atherosclerosis (ICD10-I70.0). Electronically Signed   By: Constance Holster M.D.   On: 06/02/2019 17:18   DG Chest Port 1 View  Result Date: 06/02/2019 CLINICAL DATA:  Shortness of breath EXAM: PORTABLE CHEST 1 VIEW COMPARISON:  05/09/2019 FINDINGS: Again noted are diffuse bilateral airspace opacities with some interval progression in the left mid and left lower lung zone. There is no pneumothorax. No large pleural effusion. The heart size is stable. Aortic calcifications are noted. IMPRESSION: Interval worsening of multifocal airspace opacities, most evident in the left mid and left lower lung zones. Findings are concerning for multifocal pneumonia (viral or bacterial). Electronically Signed   By: Constance Holster M.D.   On: 06/02/2019 16:13    Procedures Procedures  (including critical care time)  Medications Ordered in ED Medications  albuterol (VENTOLIN HFA) 108 (90 Base) MCG/ACT inhaler 4 puff (has no administration in time range)  iohexol (OMNIPAQUE) 350 MG/ML injection 100 mL (100 mLs Intravenous Contrast Given 06/02/19 1649)    ED Course  I have reviewed the triage vital signs and the nursing notes.  Pertinent labs & imaging results that were available during my care of the patient were reviewed by me and considered in my medical decision making (see chart for details).    MDM Rules/Calculators/A&P                     Patient is on 2 L nasal rate now and his O2 sat is 91%.  He will be admitted for hypoxia secondary to Covid infection and COPD Final Clinical Impression(s) / ED Diagnoses Final diagnoses:  COVID-19    Rx / DC Orders ED Discharge Orders    None       Milton Ferguson, MD 06/02/19 2044

## 2019-06-03 DIAGNOSIS — Z8616 Personal history of COVID-19: Secondary | ICD-10-CM | POA: Diagnosis present

## 2019-06-03 DIAGNOSIS — E785 Hyperlipidemia, unspecified: Secondary | ICD-10-CM

## 2019-06-03 DIAGNOSIS — C9110 Chronic lymphocytic leukemia of B-cell type not having achieved remission: Secondary | ICD-10-CM

## 2019-06-03 DIAGNOSIS — J441 Chronic obstructive pulmonary disease with (acute) exacerbation: Secondary | ICD-10-CM | POA: Diagnosis present

## 2019-06-03 DIAGNOSIS — J9621 Acute and chronic respiratory failure with hypoxia: Secondary | ICD-10-CM | POA: Diagnosis present

## 2019-06-03 LAB — CBC WITH DIFFERENTIAL/PLATELET
Abs Immature Granulocytes: 0.25 10*3/uL — ABNORMAL HIGH (ref 0.00–0.07)
Basophils Absolute: 0 10*3/uL (ref 0.0–0.1)
Basophils Relative: 0 %
Eosinophils Absolute: 0 10*3/uL (ref 0.0–0.5)
Eosinophils Relative: 0 %
HCT: 42.7 % (ref 39.0–52.0)
Hemoglobin: 13.4 g/dL (ref 13.0–17.0)
Immature Granulocytes: 0 %
Lymphocytes Relative: 92 %
Lymphs Abs: 89.9 10*3/uL — ABNORMAL HIGH (ref 0.7–4.0)
MCH: 29.5 pg (ref 26.0–34.0)
MCHC: 31.4 g/dL (ref 30.0–36.0)
MCV: 93.8 fL (ref 80.0–100.0)
Monocytes Absolute: 1 10*3/uL (ref 0.1–1.0)
Monocytes Relative: 1 %
Neutro Abs: 6.4 10*3/uL (ref 1.7–7.7)
Neutrophils Relative %: 7 %
Platelets: 170 10*3/uL (ref 150–400)
RBC: 4.55 MIL/uL (ref 4.22–5.81)
RDW: 14.2 % (ref 11.5–15.5)
WBC Morphology: REACTIVE
WBC: 97.6 10*3/uL (ref 4.0–10.5)
nRBC: 0 % (ref 0.0–0.2)

## 2019-06-03 LAB — C-REACTIVE PROTEIN
CRP: 4 mg/dL — ABNORMAL HIGH (ref <1.0)
CRP: 6 mg/dL — ABNORMAL HIGH

## 2019-06-03 LAB — FERRITIN
Ferritin: 95 ng/mL (ref 24–336)
Ferritin: 103 ng/mL (ref 24–336)

## 2019-06-03 LAB — COMPREHENSIVE METABOLIC PANEL
ALT: 17 U/L (ref 0–44)
AST: 24 U/L (ref 15–41)
Albumin: 3.8 g/dL (ref 3.5–5.0)
Alkaline Phosphatase: 103 U/L (ref 38–126)
Anion gap: 11 (ref 5–15)
BUN: 21 mg/dL (ref 8–23)
CO2: 23 mmol/L (ref 22–32)
Calcium: 8.9 mg/dL (ref 8.9–10.3)
Chloride: 105 mmol/L (ref 98–111)
Creatinine, Ser: 0.93 mg/dL (ref 0.61–1.24)
GFR calc Af Amer: >60 mL/min (ref >60)
GFR calc non Af Amer: >60 mL/min (ref >60)
Glucose, Bld: 152 mg/dL — ABNORMAL HIGH (ref 70–99)
Potassium: 4.1 mmol/L (ref 3.5–5.1)
Sodium: 139 mmol/L (ref 135–145)
Total Bilirubin: 0.5 mg/dL (ref 0.3–1.2)
Total Protein: 6.6 g/dL (ref 6.5–8.1)

## 2019-06-03 LAB — PROCALCITONIN: Procalcitonin: <0.1 ng/mL

## 2019-06-03 LAB — D-DIMER, QUANTITATIVE
D-Dimer, Quant: 0.58 ug/mL-FEU — ABNORMAL HIGH (ref 0.00–0.50)
D-Dimer, Quant: 0.47 ug{FEU}/mL (ref 0.00–0.50)

## 2019-06-03 LAB — VITAMIN D 25 HYDROXY (VIT D DEFICIENCY, FRACTURES): Vit D, 25-Hydroxy: 34.96 ng/mL (ref 30–100)

## 2019-06-03 LAB — HIV ANTIBODY (ROUTINE TESTING W REFLEX): HIV Screen 4th Generation wRfx: NONREACTIVE

## 2019-06-03 LAB — ABO/RH: ABO/RH(D): O POS

## 2019-06-03 MED ORDER — DEXAMETHASONE 4 MG PO TABS
6.0000 mg | ORAL_TABLET | Freq: Every day | ORAL | Status: DC
  Administered 2019-06-03: 6 mg via ORAL
  Filled 2019-06-03: qty 2

## 2019-06-03 MED ORDER — ENOXAPARIN SODIUM 40 MG/0.4ML ~~LOC~~ SOLN
40.0000 mg | Freq: Every day | SUBCUTANEOUS | Status: DC
  Administered 2019-06-03 (×2): 40 mg via SUBCUTANEOUS
  Filled 2019-06-03 (×2): qty 0.4

## 2019-06-03 MED ORDER — DEXAMETHASONE 4 MG PO TABS
4.0000 mg | ORAL_TABLET | Freq: Every day | ORAL | Status: DC
  Administered 2019-06-04: 4 mg via ORAL
  Filled 2019-06-03: qty 1

## 2019-06-03 MED ORDER — SODIUM CHLORIDE 0.9 % IV SOLN: INTRAVENOUS | Status: DC

## 2019-06-03 MED ORDER — OMEGA-3 FATTY ACIDS 1000 MG PO CAPS: 1.0000 g | ORAL_CAPSULE | Freq: Every day | ORAL | Status: DC

## 2019-06-03 MED ORDER — LORATADINE 10 MG PO TABS
10.0000 mg | ORAL_TABLET | Freq: Every day | ORAL | Status: DC
  Administered 2019-06-03 – 2019-06-04 (×2): 10 mg via ORAL
  Filled 2019-06-03 (×2): qty 1

## 2019-06-03 MED ORDER — ONDANSETRON HCL 4 MG PO TABS: 4.0000 mg | ORAL_TABLET | Freq: Four times a day (QID) | ORAL | Status: DC | PRN

## 2019-06-03 MED ORDER — IMMUNE GLOBULIN (HUMAN) 10 GM/100ML IV SOLN
400.0000 mg/kg | Freq: Once | INTRAVENOUS | Status: AC
  Administered 2019-06-03: 30 g via INTRAVENOUS
  Filled 2019-06-03: qty 100

## 2019-06-03 MED ORDER — CALCIUM CARBONATE 1250 (500 CA) MG PO TABS
1250.0000 mg | ORAL_TABLET | Freq: Every day | ORAL | Status: DC
  Administered 2019-06-03: 1250 mg via ORAL
  Filled 2019-06-03: qty 1

## 2019-06-03 MED ORDER — DOXYCYCLINE HYCLATE 100 MG PO TABS
100.0000 mg | ORAL_TABLET | Freq: Two times a day (BID) | ORAL | Status: DC
  Administered 2019-06-03 – 2019-06-04 (×2): 100 mg via ORAL
  Filled 2019-06-03 (×3): qty 1

## 2019-06-03 MED ORDER — ADULT MULTIVITAMIN W/MINERALS CH
1.0000 | ORAL_TABLET | Freq: Every day | ORAL | Status: DC
  Administered 2019-06-03: 1 via ORAL
  Filled 2019-06-03: qty 1

## 2019-06-03 MED ORDER — SODIUM CHLORIDE 0.9 % IV SOLN: 200.0000 mg | Freq: Once | INTRAVENOUS | Status: DC

## 2019-06-03 MED ORDER — OMEGA-3-ACID ETHYL ESTERS 1 G PO CAPS
1.0000 g | ORAL_CAPSULE | Freq: Every day | ORAL | Status: DC
  Administered 2019-06-03: 1 g via ORAL
  Filled 2019-06-03: qty 1

## 2019-06-03 MED ORDER — BUPROPION HCL ER (SR) 150 MG PO TB12
150.0000 mg | ORAL_TABLET | Freq: Every day | ORAL | Status: DC
  Administered 2019-06-03 – 2019-06-04 (×2): 150 mg via ORAL
  Filled 2019-06-03 (×2): qty 1

## 2019-06-03 MED ORDER — ALBUTEROL SULFATE HFA 108 (90 BASE) MCG/ACT IN AERS: 4.0000 | INHALATION_SPRAY | RESPIRATORY_TRACT | Status: DC | PRN

## 2019-06-03 MED ORDER — SODIUM CHLORIDE 0.9 % IV SOLN: 100.0000 mg | Freq: Every day | INTRAVENOUS | Status: DC

## 2019-06-03 MED ORDER — NIACIN ER (ANTIHYPERLIPIDEMIC) 500 MG PO TBCR
500.0000 mg | EXTENDED_RELEASE_TABLET | Freq: Every day | ORAL | Status: DC
  Administered 2019-06-03: 500 mg via ORAL
  Filled 2019-06-03 (×6): qty 1

## 2019-06-03 MED ORDER — PRAVASTATIN SODIUM 10 MG PO TABS
20.0000 mg | ORAL_TABLET | Freq: Every day | ORAL | Status: DC
  Administered 2019-06-03: 20 mg via ORAL
  Filled 2019-06-03: qty 2

## 2019-06-03 MED ORDER — IPRATROPIUM-ALBUTEROL 0.5-2.5 (3) MG/3ML IN SOLN
3.0000 mL | RESPIRATORY_TRACT | Status: DC
  Administered 2019-06-03 (×2): 3 mL via RESPIRATORY_TRACT
  Filled 2019-06-03 (×2): qty 3

## 2019-06-03 MED ORDER — ONDANSETRON HCL 4 MG/2ML IJ SOLN: 4.0000 mg | Freq: Four times a day (QID) | INTRAMUSCULAR | Status: DC | PRN

## 2019-06-03 MED ORDER — PENTOXIFYLLINE ER 400 MG PO TBCR
400.0000 mg | EXTENDED_RELEASE_TABLET | Freq: Three times a day (TID) | ORAL | Status: DC
  Administered 2019-06-03 – 2019-06-04 (×4): 400 mg via ORAL
  Filled 2019-06-03 (×4): qty 1

## 2019-06-03 MED ORDER — ALBUTEROL SULFATE HFA 108 (90 BASE) MCG/ACT IN AERS: 2.0000 | INHALATION_SPRAY | RESPIRATORY_TRACT | Status: DC | PRN

## 2019-06-03 MED ORDER — ALPRAZOLAM 1 MG PO TABS: 1.0000 mg | ORAL_TABLET | Freq: Three times a day (TID) | ORAL | Status: DC | PRN

## 2019-06-03 MED ORDER — HYDROCODONE-ACETAMINOPHEN 5-325 MG PO TABS: 1.0000 | ORAL_TABLET | Freq: Four times a day (QID) | ORAL | Status: DC | PRN

## 2019-06-03 MED ORDER — VALACYCLOVIR HCL 500 MG PO TABS
500.0000 mg | ORAL_TABLET | Freq: Every day | ORAL | Status: DC
  Administered 2019-06-03 – 2019-06-04 (×2): 500 mg via ORAL
  Filled 2019-06-03 (×2): qty 1

## 2019-06-03 MED ORDER — IPRATROPIUM-ALBUTEROL 0.5-2.5 (3) MG/3ML IN SOLN
3.0000 mL | RESPIRATORY_TRACT | Status: DC
  Filled 2019-06-03 (×2): qty 3

## 2019-06-03 NOTE — Consult Note (Signed)
[facility] Consultation Oncology  Name: Alfred Brown      MRN: [id number]    Location: A330/A330-01  Date: 06/03/2019 Time:6:09 PM   REFERRING PHYSICIAN: Dr. Wynetta Emery  REASON FOR CONSULT: CLL   DIAGNOSIS: Multifocal pneumonia from Covid 19.  HISTORY OF PRESENT ILLNESS: Alfred Brown is a 69 year old very pleasant white male who is seen in consultation today at the request of Dr. Wynetta Emery for further management of CLL in the setting of COVID-19 infection and atypical pneumonia findings on the CT scan.  He was diagnosed with stage II CLL, initially around 2010 and has been on observation since then.  He had history of lymphadenopathy in the neck region but without any B symptoms or recurrent infections.  His white count went up to as high as 140 K with normal platelet count and hemoglobin and normal LDH.  Previous CT of the abdomen and pelvis showed lymphadenopathy, 1 to 2 cm in size with normal spleen.  Patient does not remember all the details.  However he was diagnosed with COVID-19 infection around 05/09/2019 and received outpatient IV monoclonal antibody and has done well after treatments.  He reportedly went to Dr. Gerarda Fraction yesterday and because of his breathing, was told to come to the ER.  CT angio done in the ER on 06/02/2019 reviewed by me showed negative pulmonary emboli.  Diffuse bilateral groundglass airspace opacities consistent with the patient's history of pneumonia from COVID-19.  Enlarged lymph nodes in the porta hepatis similar to CT scan from 2017.  Mildly enlarged mediastinal and hilar lymph nodes noted.  Patient currently being tapered off of oxygen.  He is at 2 L nasal cannula saturating 93%.  Respiratory precautions were taken off.  He does not recollect any prior infections in the last 6 months.  PAST MEDICAL HISTORY:   Past Medical History:  Diagnosis Date  . Aortic valve disorder    Mild insufficiency  . Chronic lymphocytic leukemia ([place])    01/2012: WBC-90.2, H&H-15.1/45.3,  platelets-185 03/31/12: Verified with Dr. Tressie Stalker that no precautions or modification of medical regime are required prior to orthopaedic surgery.   . CLL (chronic lymphocytic leukemia) ([place])   . CMV (cytomegalovirus) ([place])   . COPD (chronic obstructive pulmonary disease) ([place])   . Depression with anxiety   . DJD (degenerative joint disease)   . GERD (gastroesophageal reflux disease)   . Hyperlipidemia   . Lipoma    left chest wall  . Palpitations 2004   PVCs; borderline stress nuclear in 2004, negative in 2007; Echo 2007; AV-sclerotic, very mild AI  . Right bundle branch block    + left posterior fascicular block  . Tobacco abuse    80 pack years    ALLERGIES: No Known Allergies    MEDICATIONS: I have reviewed the patient's current medications.     PAST SURGICAL HISTORY Past Surgical History:  Procedure Laterality Date  . COLONOSCOPY  01/2012   Negative screening study  . GANGLION CYST EXCISION  1997   left wrist  . ROTATOR CUFF REPAIR  1997   right  . SHOULDER ARTHROSCOPY WITH SUBACROMIAL DECOMPRESSION  04/09/2012   Procedure: SHOULDER ARTHROSCOPY WITH SUBACROMIAL DECOMPRESSION;  Surgeon: Ninetta Lights, MD;  Location: [facility];  Service: Orthopedics;  Laterality: Left;  LEFT SHOULDER ARTHROSCOPY, SUBACROMIAL DECOMPRESSION, PARTIAL ACROMIOPLASTY WITH CORACOACROMIAL RELEASE, DISTAL CLAVICULECTOMY WITH ROTATOR CUFF REPAIR, DEBRIDEMENT OF LABRUM    FAMILY HISTORY: Family History  Problem Relation Age of Onset  . Stroke Mother   .  Heart attack Father   . Cancer Sister        colon  . Colon cancer Sister   . Cancer Brother        2 brothers died with lung cancer    SOCIAL HISTORY:  reports that he has been smoking cigarettes. He has a 45.00 pack-year smoking history. He has never used smokeless tobacco. He reports that he does not drink alcohol or use drugs.  PERFORMANCE STATUS: The patient's performance status is 2 - Symptomatic, <50% confined to  bed  PHYSICAL EXAM: Most Recent Vital Signs: Blood pressure 123/60, pulse 100, temperature 97.7 F (36.5 C), temperature source Oral, resp. rate 20, height 6\' 1"  (1.854 m), weight 171 lb 15.3 oz (78 kg), SpO2 95 %. BP 123/60 (BP Location: Left Arm)   Pulse 100   Temp 97.7 F (36.5 C) (Oral)   Resp 20   Ht 6\' 1"  (1.854 m)   Wt 171 lb 15.3 oz (78 kg)   SpO2 95%   BMI 22.69 kg/m  General appearance: alert, cooperative and appears stated age Head: Normocephalic, without obvious abnormality, atraumatic Neck: supple, symmetrical, trachea midline and Subcentimeter lymph nodes on bilateral neck. Lungs: Bilateral air entry with occasional crackles. Heart: regular rate and rhythm Abdomen: soft, non-tender; bowel sounds normal; no masses,  no organomegaly Extremities: extremities normal, atraumatic, no cyanosis or edema Skin: Skin color, texture, turgor normal. No rashes or lesions Lymph nodes: Palpable subcentimeter lymph nodes in the bilateral neck and axillary region. Neurologic: Grossly normal  LABORATORY DATA:  Results for orders placed or performed during the hospital encounter of 06/02/19 (from the past 48 hour(s))  HIV Antibody (routine testing w rflx)     Status: None   Collection Time: 06/02/19  3:30 PM  Result Value Ref Range   HIV Screen 4th Generation wRfx NON REACTIVE NON REACTIVE    Comment: Performed at [facility], [address] [place], [place] [address]  CBC with Differential/Platelet     Status: Abnormal   Collection Time: 06/02/19  3:37 PM  Result Value Ref Range   WBC 86.6 (HH) 4.0 - 10.5 K/uL    Comment: REPEATED TO VERIFY WHITE COUNT CONFIRMED ON SMEAR THIS CRITICAL RESULT HAS VERIFIED AND BEEN CALLED TO T JACKSON BY HILLARY FLYNT ON 03 03 2021 AT 1600, AND HAS BEEN READ BACK.  CALLED TO DAWN HENRY AT 7106 BY HFLYNT 06/02/19 CORRECTED ON 03/03 AT 1610: PREVIOUSLY REPORTED AS 86.6 REPEATED TO VERIFY WHITE COUNT CONFIRMED ON SMEAR THIS CRITICAL RESULT HAS  VERIFIED AND BEEN CALLED TO T JACKSON BY HILLARY FLYNT ON 03 03 2021 AT 1600, AND HAS BEEN READ BACK.     RBC 4.54 4.22 - 5.81 MIL/uL   Hemoglobin 13.4 13.0 - 17.0 g/dL   HCT 43.5 39.0 - 52.0 %   MCV 95.8 80.0 - 100.0 fL   MCH 29.5 26.0 - 34.0 pg   MCHC 30.8 30.0 - 36.0 g/dL   RDW 14.1 11.5 - 15.5 %   Platelets 168 150 - 400 K/uL   nRBC 0.0 0.0 - 0.2 %   Neutrophils Relative % 7 %   Neutro Abs 5.5 1.7 - 7.7 K/uL   Lymphocytes Relative 91 %   Lymphs Abs 79.2 (H) 0.7 - 4.0 K/uL   Monocytes Relative 2 %   Monocytes Absolute 1.3 (H) 0.1 - 1.0 K/uL   Eosinophils Relative 0 %   Eosinophils Absolute 0.2 0.0 - 0.5 K/uL   Basophils Relative 0 %  Basophils Absolute 0.1 0.0 - 0.1 K/uL   WBC Morphology ABSOLUTE LYMPHOCYTOSIS    Immature Granulocytes 0 %   Abs Immature Granulocytes 0.18 (H) 0.00 - 0.07 K/uL   Reactive, Benign Lymphocytes PRESENT    Smudge Cells PRESENT     Comment: Performed at [facility], [address], [place], [place] [address]  Comprehensive metabolic panel     Status: Abnormal   Collection Time: 06/02/19  3:37 PM  Result Value Ref Range   Sodium 140 135 - 145 mmol/L   Potassium 4.4 3.5 - 5.1 mmol/L   Chloride 106 98 - 111 mmol/L   CO2 25 22 - 32 mmol/L   Glucose, Bld 110 (H) 70 - 99 mg/dL    Comment: Glucose reference range applies only to samples taken after fasting for at least 8 hours.   BUN 24 (H) 8 - 23 mg/dL   Creatinine, Ser 1.07 0.61 - 1.24 mg/dL   Calcium 8.9 8.9 - 10.3 mg/dL   Total Protein 6.3 (L) 6.5 - 8.1 g/dL   Albumin 3.6 3.5 - 5.0 g/dL   AST 22 15 - 41 U/L   ALT 14 0 - 44 U/L   Alkaline Phosphatase 102 38 - 126 U/L   Total Bilirubin 0.6 0.3 - 1.2 mg/dL   GFR calc non Af Amer >60 >60 mL/min   GFR calc Af Amer >60 >60 mL/min   Anion gap 9 5 - 15    Comment: Performed at [facility], [address] [place], [place] [address]  C-reactive protein     Status: Abnormal   Collection Time: 06/02/19  3:37 PM  Result Value Ref Range   CRP 4.0 (H)  <1.0 mg/dL    Comment: Performed at [facility], [address], [place], [place] [address]  Ferritin     Status: None   Collection Time: 06/02/19  3:37 PM  Result Value Ref Range   Ferritin 95 24 - 336 ng/mL    Comment: Performed at [facility], [address], [place], [place] [address]  ABO/Rh     Status: None   Collection Time: 06/03/19 12:46 AM  Result Value Ref Range   ABO/RH(D)      O POS Performed at [facility], [address], [place], [place] [address]   D-dimer, quantitative (not at [facility])     Status: Abnormal   Collection Time: 06/03/19 12:46 AM  Result Value Ref Range   D-Dimer, Quant 0.58 (H) 0.00 - 0.50 ug/mL-FEU    Comment: (NOTE) At the manufacturer cut-off of 0.50 ug/mL FEU, this assay has been documented to exclude PE with a sensitivity and negative predictive value of 97 to 99%.  At this time, this assay has not been approved by the FDA to exclude DVT/VTE. Results should be correlated with clinical presentation. Performed at [facility], [address], [place],  [address]   Procalcitonin     Status: None   Collection Time: 06/03/19 12:46 AM  Result Value Ref Range   Procalcitonin <0.10 ng/mL    Comment:        Interpretation: PCT (Procalcitonin) <= 0.5 ng/mL: Systemic infection (sepsis) is not likely. Local bacterial infection is possible. (NOTE)       Sepsis PCT Algorithm           Lower Respiratory Tract  Infection PCT Algorithm    ----------------------------     ----------------------------         PCT < 0.25 ng/mL                PCT < 0.10 ng/mL         Strongly encourage             Strongly discourage   discontinuation of antibiotics    initiation of antibiotics    ----------------------------     -----------------------------       PCT 0.25 - 0.50 ng/mL            PCT 0.10 - 0.25 ng/mL               OR       >80% decrease in PCT            Discourage initiation of                                             antibiotics      Encourage discontinuation           of antibiotics    ----------------------------     -----------------------------         PCT >= 0.50 ng/mL              PCT 0.26 - 0.50 ng/mL               AND        <80% decrease in PCT             Encourage initiation of                                             antibiotics       Encourage continuation           of antibiotics    ----------------------------     -----------------------------        PCT >= 0.50 ng/mL                  PCT > 0.50 ng/mL               AND         increase in PCT                  Strongly encourage                                      initiation of antibiotics    Strongly encourage escalation           of antibiotics                                     -----------------------------                                           PCT <= 0.25 ng/mL  OR                                        > 80% decrease in PCT                                     Discontinue / Do not initiate                                             antibiotics Performed at [facility], [address], [place], [place] [address]   CBC with Differential/Platelet     Status: Abnormal   Collection Time: 06/03/19  9:29 AM  Result Value Ref Range   WBC 97.6 (HH) 4.0 - 10.5 K/uL    Comment: WHITE COUNT CONFIRMED ON SMEAR THIS CRITICAL RESULT HAS VERIFIED AND BEEN CALLED TO FOX,J BY BOBBIE MATTHEWS ON 03 04 2021 AT 1012, AND HAS BEEN READ BACK.     RBC 4.55 4.22 - 5.81 MIL/uL   Hemoglobin 13.4 13.0 - 17.0 g/dL   HCT 42.7 39.0 - 52.0 %   MCV 93.8 80.0 - 100.0 fL   MCH 29.5 26.0 - 34.0 pg   MCHC 31.4 30.0 - 36.0 g/dL   RDW 14.2 11.5 - 15.5 %   Platelets 170 150 - 400 K/uL   nRBC 0.0 0.0 - 0.2 %   Neutrophils Relative % 7 %   Neutro Abs 6.4 1.7 - 7.7 K/uL   Lymphocytes Relative 92 %   Lymphs Abs 89.9 (H) 0.7 - 4.0 K/uL   Monocytes Relative 1 %   Monocytes Absolute 1.0 0.1 - 1.0  K/uL   Eosinophils Relative 0 %   Eosinophils Absolute 0.0 0.0 - 0.5 K/uL   Basophils Relative 0 %   Basophils Absolute 0.0 0.0 - 0.1 K/uL   WBC Morphology REACTIVE LYMPHOCYTES PRESENT    Immature Granulocytes 0 %   Abs Immature Granulocytes 0.25 (H) 0.00 - 0.07 K/uL    Comment: Performed at [facility], [address] [place], [place] [address]  Comprehensive metabolic panel     Status: Abnormal   Collection Time: 06/03/19  9:29 AM  Result Value Ref Range   Sodium 139 135 - 145 mmol/L   Potassium 4.1 3.5 - 5.1 mmol/L   Chloride 105 98 - 111 mmol/L   CO2 23 22 - 32 mmol/L   Glucose, Bld 152 (H) 70 - 99 mg/dL    Comment: Glucose reference range applies only to samples taken after fasting for at least 8 hours.   BUN 21 8 - 23 mg/dL   Creatinine, Ser 0.93 0.61 - 1.24 mg/dL   Calcium 8.9 8.9 - 10.3 mg/dL   Total Protein 6.6 6.5 - 8.1 g/dL   Albumin 3.8 3.5 - 5.0 g/dL   AST 24 15 - 41 U/L   ALT 17 0 - 44 U/L   Alkaline Phosphatase 103 38 - 126 U/L   Total Bilirubin 0.5 0.3 - 1.2 mg/dL   GFR calc non Af Amer >60 >60 mL/min   GFR calc Af Amer >60 >60 mL/min   Anion gap 11 5 - 15    Comment: Performed at [facility], [address], [place],  [place] [address]  C-reactive protein     Status: Abnormal   Collection Time: 06/03/19  9:29 AM  Result Value Ref Range   CRP 6.0 (H) <1.0 mg/dL    Comment: Performed at [facility], [address] [place], [place] [address]  D-dimer, quantitative (not at [facility])     Status: None   Collection Time: 06/03/19  9:29 AM  Result Value Ref Range   D-Dimer, Quant 0.47 0.00 - 0.50 ug/mL-FEU    Comment: (NOTE) At the manufacturer cut-off of 0.50 ug/mL FEU, this assay has been documented to exclude PE with a sensitivity and negative predictive value of 97 to 99%.  At this time, this assay has not been approved by the FDA to exclude DVT/VTE. Results should be correlated with clinical presentation. Performed at [facility], [address]  [place], [place] [address]   Ferritin     Status: None   Collection Time: 06/03/19  9:29 AM  Result Value Ref Range   Ferritin 103 24 - 336 ng/mL    Comment: Performed at [facility], [address] [place], [place] [address]      RADIOGRAPHY: CT Angio Chest PE W and/or Wo Contrast  Result Date: 06/02/2019 CLINICAL DATA:  Shortness of breath. EXAM: CT ANGIOGRAPHY CHEST WITH CONTRAST TECHNIQUE: Multidetector CT imaging of the chest was performed using the standard protocol during bolus administration of intravenous contrast. Multiplanar CT image reconstructions and MIPs were obtained to evaluate the vascular anatomy. CONTRAST:  175mL OMNIPAQUE IOHEXOL 350 MG/ML SOLN COMPARISON:  October 28, 2011 FINDINGS: Cardiovascular: Evaluation is limited by respiratory motion artifact.There is no pulmonary embolus. The main pulmonary artery is within normal limits for size. There is no CT evidence of acute right heart strain. Atherosclerotic changes are noted of the thoracic aorta without evidence for an aneurysm. Heart size is normal. Coronary artery calcifications are noted. There is no significant pericardial effusion. Mediastinum/Nodes: --mildly enlarged mediastinal and hilar lymph nodes are noted. For example there is a precarinal lymph node measuring approximately 1.5 cm. This is increased from prior study in 2013 but is still favored to represent a reactive lymph node. --there are mildly enlarged axillary lymph nodes which are relatively stable when compared to 2013 and are likely of no clinical significance. --No supraclavicular lymphadenopathy. --Normal thyroid gland. --The esophagus is unremarkable Lungs/Pleura: There are diffuse hazy ground-glass coarse airspace opacities bilaterally consistent with the patient's history of viral pneumonia. There is no pneumothorax. No large pleural effusion. The trachea is unremarkable. Upper Abdomen: The spleen appears to be at least mildly enlarged. The visualized is upper  abdomen is grossly unremarkable. Musculoskeletal: No chest wall abnormality. No acute or significant osseous findings. Again noted are mildly enlarged lymph nodes in the region of the porta hepatis. Review of the MIP images confirms the above findings. IMPRESSION: 1. Evaluation for pulmonary emboli is limited by respiratory motion artifact. Given this limitation, no acute pulmonary embolism was detected. 2. Diffuse bilateral ground-glass airspace opacities consistent with the patient's history of COVID-19 pneumonia. 3. Probable borderline splenomegaly. Again noted are enlarged lymph nodes in the porta hepatis, similar to CT from 2017. 4. Presumed reactive mediastinal adenopathy is noted. Aortic Atherosclerosis (ICD10-I70.0). Electronically Signed   By: Constance Holster M.D.   On: 06/02/2019 17:18   DG Chest Port 1 View  Result Date: 06/02/2019 CLINICAL DATA:  Shortness of breath EXAM: PORTABLE CHEST 1 VIEW COMPARISON:  05/09/2019 FINDINGS: Again noted are diffuse bilateral airspace opacities with some interval progression in  the left mid and left lower lung zone. There is no pneumothorax. No large pleural effusion. The heart size is stable. Aortic calcifications are noted. IMPRESSION: Interval worsening of multifocal airspace opacities, most evident in the left mid and left lower lung zones. Findings are concerning for multifocal pneumonia (viral or bacterial). Electronically Signed   By: Constance Holster M.D.   On: 06/02/2019 16:13        ASSESSMENT and PLAN:  1.  CLL: -He was diagnosed in 2010, on observation. -Did not have any B symptoms in the last 6 months to 1 year. -His white count has gone up to as high as 140 K in the outpatient setting without any anemia or thrombocytopenia. -CBC today shows white count 97.6 with normal hemoglobin and platelet count.  Absolute neutrophil count is 6.4.  Predominant cell type is 92% lymphocytes. -No worsening lymphadenopathy in the neck or axillary region.   No major splenomegaly. -No indication for CLL treatment at this time.  He will follow up with me in 2 to 3 weeks after discharge from the hospital.  2.  Pulmonary infiltrates in the setting of COVID-19 infection: -He was apparently diagnosed with COVID-19 on 05/09/2019 and received outpatient monoclonal antibody. -Respiratory isolation and remdesivir have been discontinued today. -I reviewed his chest x-ray and CT which were negative for PE.  Diffuse bilateral groundglass airspace opacities consistent with patient's history of COVID-19 pneumonia.  Mild mediastinal lymphadenopathy present.  Enlarged lymph nodes in the porta hepatis similar to CT from 2017.  3.  Hypogammaglobulinemia: -His IgG is between 370 and 420 in the past. -IVIG was not recommended because of lack of recurrent infections. -IVIG may be given as 1 dose of 400 mg/kg infusion during this admission. -I will talk to Dr. Wynetta Emery about this.  All questions were answered. The patient knows to call the clinic with any problems, questions or concerns. We can certainly see the patient much sooner if necessary.    Derek Jack

## 2019-06-03 NOTE — Progress Notes (Signed)
PROGRESS NOTE [facility]  [facility]  WJX:[id number]  DOB: May 11, 1950  DOA: 06/02/2019 PCP: Redmond School, MD   Brief Admission Hx: 69 y.o. male, with history of CLL, GERD, hyperlipidemia, COPD who was diagnosed with COVID-19 infection diagnosed 05/09/19 and received outpatient IV monoclonal antibody at [facility] had done well after treatments.  In last couple of days presents with increasing SOB, fever, found to have pneumonia on CXR and new oxygen requirement.    MDM/Assessment & Plan:   1. Acute on chronic respiratory failure with hypoxia - Pt presenting with a new oxygen requirement.  He has had increasing SOB.  His symptoms are consistent with an atypical pneumonia versus COPD exacerbation.  He is being treated for both.  2. History of Covid 24 - He was diagnosed on 05/09/19 and received monoclonal antibody therapy and felt much better until recently.  It has been nearly 30 days since he was diagnosed and does not need Covid isolation.  He is likely not receiving any benefit from remdesivir at this late stage and will discontinue.  3. COPD exacerbation - He is being treated with steroids, added duonebs and added doxycycline.  4. Atypical pneumonia- CXR and CT findings are likely sequela of post Covid 19 infection.  He is on doxycycline to cover atypicals although his procalcitonin is low which argues against bacterial infection.  5. CLL with leukocytosis - wife reports he is followed by heme/onc and has WBC counts up to 140, he had a bump in WBC after steroids and we are following.  Hematology consult requested.  6. Hypogammaglobulinemia - from underlying CLL per hem/onc noted.  They did not recommend IVIG when he was seen on 05/25/19.  I asked for inpatient hematology consult.   7. Hyperlipidemia - resumed on home meds.  8. Anxiety / depression - resumed home meds.  9. Generalized weakness - PT evaluation requested.   DVT prophylaxis: lovenox  Code Status: Full  Family Communication:  wife updated by telephone  Disposition Plan: continue inpatient managements  Consultants:    Procedures:    Antimicrobials:  Doxycycline    Subjective: Pt reports that he feels better wearing the oxygen.  He mostly gets SOB when up ambulating.  He has dry nonproductive cough, no chest pain and no fever.    Objective: Vitals:   06/02/19 2330 06/03/19 0043 06/03/19 0635 06/03/19 1209  BP: 129/74 135/72 130/69 115/65  Pulse: 96 91 88 90  Resp: (!) 27 20 20    Temp:  98.2 F (36.8 C) 98 F (36.7 C) 97.6 F (36.4 C)  TempSrc:  Oral Oral Oral  SpO2: 95% 97% 95% 94%  Weight:      Height:        Intake/Output Summary (Last 24 hours) at 06/03/2019 1654 Last data filed at 06/03/2019 1422 Gross per 24 hour  Intake 1156.84 ml  Output 325 ml  Net 831.84 ml   Filed Weights   06/02/19 1449  Weight: 78 kg     REVIEW OF SYSTEMS  As per history otherwise all reviewed and reported negative  Exam:  General exam: thin male awake, alert, NAD.  Respiratory system: bibasilar wheezes heard. No increased work of breathing. Cardiovascular system: S1 & S2 heard. No JVD, murmurs, gallops, clicks or pedal edema. Gastrointestinal system: Abdomen is nondistended, soft and nontender. Normal bowel sounds heard. Central nervous system: Alert and oriented. No focal neurological deficits. Extremities: no CCE.  Data Reviewed: Basic Metabolic Panel: Recent Labs  Lab 06/02/19 1537  06/03/19 0929  NA 140 139  K 4.4 4.1  CL 106 105  CO2 25 23  GLUCOSE 110* 152*  BUN 24* 21  CREATININE 1.07 0.93  CALCIUM 8.9 8.9   Liver Function Tests: Recent Labs  Lab 06/02/19 1537 06/03/19 0929  AST 22 24  ALT 14 17  ALKPHOS 102 103  BILITOT 0.6 0.5  PROT 6.3* 6.6  ALBUMIN 3.6 3.8   No results for input(s): LIPASE, AMYLASE in the last 168 hours. No results for input(s): AMMONIA in the last 168 hours. CBC: Recent Labs  Lab 06/02/19 1537 06/03/19 0929  WBC 86.6* 97.6*  NEUTROABS 5.5 6.4    HGB 13.4 13.4  HCT 43.5 42.7  MCV 95.8 93.8  PLT 168 170   Cardiac Enzymes: No results for input(s): CKTOTAL, CKMB, CKMBINDEX, TROPONINI in the last 168 hours. CBG (last 3)  No results for input(s): GLUCAP in the last 72 hours. No results found for this or any previous visit (from the past 240 hour(s)).   Studies: CT Angio Chest PE W and/or Wo Contrast  Result Date: 06/02/2019 CLINICAL DATA:  Shortness of breath. EXAM: CT ANGIOGRAPHY CHEST WITH CONTRAST TECHNIQUE: Multidetector CT imaging of the chest was performed using the standard protocol during bolus administration of intravenous contrast. Multiplanar CT image reconstructions and MIPs were obtained to evaluate the vascular anatomy. CONTRAST:  131mL OMNIPAQUE IOHEXOL 350 MG/ML SOLN COMPARISON:  October 28, 2011 FINDINGS: Cardiovascular: Evaluation is limited by respiratory motion artifact.There is no pulmonary embolus. The main pulmonary artery is within normal limits for size. There is no CT evidence of acute right heart strain. Atherosclerotic changes are noted of the thoracic aorta without evidence for an aneurysm. Heart size is normal. Coronary artery calcifications are noted. There is no significant pericardial effusion. Mediastinum/Nodes: --mildly enlarged mediastinal and hilar lymph nodes are noted. For example there is a precarinal lymph node measuring approximately 1.5 cm. This is increased from prior study in 2013 but is still favored to represent a reactive lymph node. --there are mildly enlarged axillary lymph nodes which are relatively stable when compared to 2013 and are likely of no clinical significance. --No supraclavicular lymphadenopathy. --Normal thyroid gland. --The esophagus is unremarkable Lungs/Pleura: There are diffuse hazy ground-glass coarse airspace opacities bilaterally consistent with the patient's history of viral pneumonia. There is no pneumothorax. No large pleural effusion. The trachea is unremarkable. Upper Abdomen:  The spleen appears to be at least mildly enlarged. The visualized is upper abdomen is grossly unremarkable. Musculoskeletal: No chest wall abnormality. No acute or significant osseous findings. Again noted are mildly enlarged lymph nodes in the region of the porta hepatis. Review of the MIP images confirms the above findings. IMPRESSION: 1. Evaluation for pulmonary emboli is limited by respiratory motion artifact. Given this limitation, no acute pulmonary embolism was detected. 2. Diffuse bilateral ground-glass airspace opacities consistent with the patient's history of COVID-19 pneumonia. 3. Probable borderline splenomegaly. Again noted are enlarged lymph nodes in the porta hepatis, similar to CT from 2017. 4. Presumed reactive mediastinal adenopathy is noted. Aortic Atherosclerosis (ICD10-I70.0). Electronically Signed   By: Constance Holster M.D.   On: 06/02/2019 17:18   DG Chest Port 1 View  Result Date: 06/02/2019 CLINICAL DATA:  Shortness of breath EXAM: PORTABLE CHEST 1 VIEW COMPARISON:  05/09/2019 FINDINGS: Again noted are diffuse bilateral airspace opacities with some interval progression in the left mid and left lower lung zone. There is no pneumothorax. No large pleural effusion. The heart size is stable.  Aortic calcifications are noted. IMPRESSION: Interval worsening of multifocal airspace opacities, most evident in the left mid and left lower lung zones. Findings are concerning for multifocal pneumonia (viral or bacterial). Electronically Signed   By: Constance Holster M.D.   On: 06/02/2019 16:13     Scheduled Meds: . buPROPion  150 mg Oral Daily  . calcium carbonate  1,250 mg Oral Q breakfast  . [START ON 06/04/2019] dexamethasone  4 mg Oral Daily  . doxycycline  100 mg Oral Q12H  . enoxaparin (LOVENOX) injection  40 mg Subcutaneous QHS  . ipratropium-albuterol  3 mL Nebulization Q4H  . loratadine  10 mg Oral Daily  . multivitamin with minerals  1 tablet Oral Daily  . niacin  500 mg Oral  Daily  . omega-3 acid ethyl esters  1 g Oral Daily  . pentoxifylline  400 mg Oral TID  . pravastatin  20 mg Oral q1800  . valACYclovir  500 mg Oral Daily   Continuous Infusions: . sodium chloride Stopped (06/03/19 1131)  . remdesivir 100 mg in NS 100 mL 100 mg (06/03/19 0944)    Principal Problem:   Acute and chronic respiratory failure with hypoxia (HCC) Active Problems:   GERD (gastroesophageal reflux disease)   Hyperlipidemia   Chronic lymphocytic leukemia ([place])   Pneumonia due to COVID-19 virus   COPD exacerbation ([place])   History of COVID-19 dx 05-09-19   Time spent:   Irwin Brakeman, MD [facility] 06/03/2019, 4:54 PM    LOS: 1 day  How to contact the [facility] Attending or Consulting provider [place] or covering provider during after hours [place], for this patient?  1. Check the care team in [facility] and look for a) attending/consulting [facility] provider listed and b) the [facility] team listed 2. Log into [email or web address] and use [place]'s universal password to access. If you do not have the password, please contact the hospital operator. 3. Locate the [facility] provider you are looking for under [facility] and page to a number that you can be directly reached. 4. If you still have difficulty reaching the provider, please page the [facility] (Director on Call) for the Hospitalists listed on amion for assistance.

## 2019-06-03 NOTE — Plan of Care (Signed)

## 2019-06-03 NOTE — Progress Notes (Signed)
CRITICAL VALUE ALERT  Critical Value:  WBC - 97.6  Date & Time Notied:  06/03/19 1027h  Provider Notified: Dr. Wynetta Emery  Orders Received/Actions taken:

## 2019-06-04 LAB — CBC WITH DIFFERENTIAL/PLATELET
Abs Immature Granulocytes: 0.2 10*3/uL — ABNORMAL HIGH (ref 0.00–0.07)
Basophils Absolute: 0.1 10*3/uL (ref 0.0–0.1)
Basophils Relative: 0 %
Eosinophils Absolute: 0 10*3/uL (ref 0.0–0.5)
Eosinophils Relative: 0 %
HCT: 37.7 % — ABNORMAL LOW (ref 39.0–52.0)
Hemoglobin: 11.8 g/dL — ABNORMAL LOW (ref 13.0–17.0)
Immature Granulocytes: 0 %
Lymphocytes Relative: 90 %
Lymphs Abs: 76 10*3/uL — ABNORMAL HIGH (ref 0.7–4.0)
MCH: 29.6 pg (ref 26.0–34.0)
MCHC: 31.3 g/dL (ref 30.0–36.0)
MCV: 94.7 fL (ref 80.0–100.0)
Monocytes Absolute: 1.5 10*3/uL — ABNORMAL HIGH (ref 0.1–1.0)
Monocytes Relative: 2 %
Neutro Abs: 7.1 10*3/uL (ref 1.7–7.7)
Neutrophils Relative %: 8 %
Platelets: 171 10*3/uL (ref 150–400)
RBC: 3.98 MIL/uL — ABNORMAL LOW (ref 4.22–5.81)
RDW: 14.1 % (ref 11.5–15.5)
WBC: 84.9 10*3/uL (ref 4.0–10.5)
nRBC: 0 % (ref 0.0–0.2)

## 2019-06-04 LAB — COMPREHENSIVE METABOLIC PANEL
ALT: 15 U/L (ref 0–44)
AST: 17 U/L (ref 15–41)
Albumin: 3.2 g/dL — ABNORMAL LOW (ref 3.5–5.0)
Alkaline Phosphatase: 82 U/L (ref 38–126)
Anion gap: 8 (ref 5–15)
BUN: 27 mg/dL — ABNORMAL HIGH (ref 8–23)
CO2: 26 mmol/L (ref 22–32)
Calcium: 8.6 mg/dL — ABNORMAL LOW (ref 8.9–10.3)
Chloride: 107 mmol/L (ref 98–111)
Creatinine, Ser: 0.93 mg/dL (ref 0.61–1.24)
GFR calc Af Amer: >60 mL/min (ref >60)
GFR calc non Af Amer: >60 mL/min (ref >60)
Glucose, Bld: 112 mg/dL — ABNORMAL HIGH (ref 70–99)
Potassium: 3.8 mmol/L (ref 3.5–5.1)
Sodium: 141 mmol/L (ref 135–145)
Total Bilirubin: 0.7 mg/dL (ref 0.3–1.2)
Total Protein: 6.3 g/dL — ABNORMAL LOW (ref 6.5–8.1)

## 2019-06-04 LAB — MAGNESIUM: Magnesium: 1.8 mg/dL (ref 1.7–2.4)

## 2019-06-04 MED ORDER — DOXYCYCLINE HYCLATE 100 MG PO TABS: 100.0000 mg | ORAL_TABLET | Freq: Two times a day (BID) | ORAL | 0 refills | Status: AC

## 2019-06-04 MED ORDER — DEXAMETHASONE 2 MG PO TABS: 2.0000 mg | ORAL_TABLET | Freq: Every day | ORAL | 0 refills | Status: AC

## 2019-06-04 MED ORDER — OMEPRAZOLE 40 MG PO CPDR: 40.0000 mg | DELAYED_RELEASE_CAPSULE | Freq: Every day | ORAL | 0 refills | Status: DC

## 2019-06-04 NOTE — Progress Notes (Signed)
CRITICAL VALUE ALERT  Critical Value:  WBC 84.9  Date & Time Notied:  06/04/19  Provider Notified: Memon  Orders Received/Actions taken:

## 2019-06-04 NOTE — Discharge Summary (Signed)
Physician Discharge Summary  Rhyatt [facility] YQM:[id number] DOB: 06/10/1950 DOA: 06/02/2019  PCP: Redmond School, MD  Admit date: 06/02/2019 Discharge date: 06/04/2019  Admitted From:  Home  Disposition:  Home   Recommendations for Outpatient Follow-up:  1. Follow up with PCP in 1 weeks 2. Please obtain BMP/CBC in one week 3. Please follow up on the following pending results:  Discharge Condition: STABLE   CODE STATUS: FULL    Brief Hospitalization Summary: Please see all hospital notes, images, labs for full details of the hospitalization.  ADMISSION HPI:  Alfred Brown  is a 69 y.o. male, with history of CLL, GERD, hyperlipidemia, COPD who was diagnosed with COVID-19 infection 3 weeks ago, and received outpatient IV monoclonal antibody at [facility].  Patient today went to his primary care physician for fever and shortness of breath.  Chest x-ray was consistent with pneumonia.  Patient was sent to ED for further evaluation.  In the ED CT chest showed multifocal pneumonia, worrisome for viral pneumonia. Patient has history of CLL, WBC count is 86,000.  He is followed by hematology/oncology at [facility] as outpatient. He denies nausea vomiting or diarrhea. Patient said that he started having fever last week Also complains of shortness of breath. In the ED patient was put on 2 L/min of oxygen.  He denies abdominal pain, dysuria   Brief Admission Hx: 68 y.o.male,with history of CLL, GERD, hyperlipidemia, COPD who was diagnosed with COVID-19 infection diagnosed 05/09/19 and received outpatient IV monoclonal antibody at [facility] had done well after treatments.  In last couple of days presents with increasing SOB, fever, found to have pneumonia on CXR and new oxygen requirement.    MDM/Assessment & Plan:   1. Acute on chronic respiratory failure with hypoxia - Pt presented with a new oxygen requirement.  He has had increasing SOB prior to admission.  His symptoms are consistent with COPD exacerbation.  He  responded well to treatments and now off oxygen, ambulating, feeling better.   2. History of Covid 33 - He was diagnosed on 05/09/19 and received monoclonal antibody therapy and felt much better until recently.  It has been nearly 30 days since he was diagnosed and does not need Covid isolation.   3. COPD exacerbation - He is being treated with steroids, duonebs and doxycycline.  4. Atypical pneumonia- CXR and CT findings are likely sequela of post Covid 19 infection.  He is on doxycycline to cover atypicals although his procalcitonin is low which argues against bacterial infection.  5. CLL with leukocytosis - wife reports he is followed by heme/onc and has WBC counts up to 140, he had a bump in WBC after steroids and we are following.  Hematology consult requested.  6. Hypogammaglobulinemia - from underlying CLL per hem/onc noted.  They did not recommend IVIG when he was seen on 05/25/19.  I asked for inpatient hematology consult and he was seen by Dr. Delton Coombes and given 1 dose of IVIG.  Pt tolerated it well.  Follow up with Hem/onc clinic in 2 weeks.  7. Hyperlipidemia - resumed on home meds.  8. Anxiety / depression - resumed home meds.  9. Generalized weakness - resolved now, he is ambulating independently in room.   DVT prophylaxis: lovenox  Code Status: Full  Family Communication: wife updated by telephone  Disposition Plan: home  Consults: Dr. Delton Coombes Hematology/Oncology  Discharge Diagnoses:  Principal Problem:   Acute and chronic respiratory failure with hypoxia ([place]) Active Problems:   GERD (gastroesophageal reflux  disease)   Hyperlipidemia   Chronic lymphocytic leukemia ([place])   Pneumonia due to COVID-19 virus   COPD exacerbation ([place])   History of COVID-19 dx 05-09-19  Discharge Instructions:  Allergies as of 06/04/2019   No Known Allergies     Medication List    STOP taking these medications   celecoxib 100 MG capsule Commonly known as: CELEBREX   ciprofloxacin 500  MG tablet Commonly known as: CIPRO   methylPREDNISolone 4 MG Tbpk tablet Commonly known as: MEDROL DOSEPAK     TAKE these medications   acetaminophen 325 MG tablet Commonly known as: TYLENOL Take 650 mg by mouth every 6 (six) hours as needed for mild pain, fever or headache.   albuterol 108 (90 Base) MCG/ACT inhaler Commonly known as: VENTOLIN HFA Inhale 2 puffs into the lungs every 6 (six) hours as needed for wheezing or shortness of breath.   ALPRAZolam 1 MG tablet Commonly known as: XANAX Take 1 mg by mouth 3 (three) times daily as needed for anxiety.   aspirin 325 MG tablet Take 325 mg by mouth daily.   buPROPion 150 MG 12 hr tablet Commonly known as: WELLBUTRIN SR Take 150 mg by mouth daily.   calcium carbonate 600 MG Tabs tablet Commonly known as: OS-CAL Take 600 mg by mouth daily.   clotrimazole-betamethasone cream Commonly known as: LOTRISONE Apply 1 application topically daily as needed. Affected area   dexamethasone 2 MG tablet Commonly known as: DECADRON Take 1 tablet (2 mg total) by mouth daily for 4 days. Start taking on: June 05, 2019   doxycycline 100 MG tablet Commonly known as: VIBRA-TABS Take 1 tablet (100 mg total) by mouth every 12 (twelve) hours for 3 days.   fish oil-omega-3 fatty acids 1000 MG capsule Take 1 g by mouth daily.   fluticasone 50 MCG/ACT nasal spray Commonly known as: FLONASE Place 2 sprays into the nose daily.   HYDROcodone-acetaminophen 5-325 MG tablet Commonly known as: NORCO/VICODIN Take 1 tablet by mouth every 6 (six) hours as needed for moderate pain.   Loratadine 10 MG Caps Take 10 mg by mouth daily.   lovastatin 20 MG tablet Commonly known as: MEVACOR Take 20 mg by mouth daily.   multivitamin tablet Take 1 tablet by mouth daily.   niacin 500 MG CR tablet Commonly known as: NIASPAN Take 500 mg by mouth daily.   omeprazole 40 MG capsule Commonly known as: PRILOSEC Take 1 capsule (40 mg total) by mouth  daily for 14 days.   pentoxifylline 400 MG CR tablet Commonly known as: TRENTAL Take 400 mg by mouth 3 (three) times daily.   valACYclovir 500 MG tablet Commonly known as: VALTREX Take 500 mg by mouth daily.      Follow-up Information    Redmond School, MD. Schedule an appointment as soon as possible for a visit in 1 week(s).   Specialty: Internal Medicine Contact information: [address] [place] [address] [phone]        Arnoldo Lenis, MD .   Specialty: Cardiology Contact information: [place] [address] [phone]        Derek Jack, MD. Schedule an appointment as soon as possible for a visit in 2 week(s).   Specialty: Hematology Contact information: [place] [address] [phone]          No Known Allergies Allergies as of 06/04/2019   No Known Allergies     Medication List  STOP taking these medications   celecoxib 100 MG capsule Commonly known as: CELEBREX   ciprofloxacin 500 MG tablet Commonly known as: CIPRO   methylPREDNISolone 4 MG Tbpk tablet Commonly known as: MEDROL DOSEPAK     TAKE these medications   acetaminophen 325 MG tablet Commonly known as: TYLENOL Take 650 mg by mouth every 6 (six) hours as needed for mild pain, fever or headache.   albuterol 108 (90 Base) MCG/ACT inhaler Commonly known as: VENTOLIN HFA Inhale 2 puffs into the lungs every 6 (six) hours as needed for wheezing or shortness of breath.   ALPRAZolam 1 MG tablet Commonly known as: XANAX Take 1 mg by mouth 3 (three) times daily as needed for anxiety.   aspirin 325 MG tablet Take 325 mg by mouth daily.   buPROPion 150 MG 12 hr tablet Commonly known as: WELLBUTRIN SR Take 150 mg by mouth daily.   calcium carbonate 600 MG Tabs tablet Commonly known as: OS-CAL Take 600 mg by mouth daily.   clotrimazole-betamethasone cream Commonly known as: LOTRISONE Apply 1 application  topically daily as needed. Affected area   dexamethasone 2 MG tablet Commonly known as: DECADRON Take 1 tablet (2 mg total) by mouth daily for 4 days. Start taking on: June 05, 2019   doxycycline 100 MG tablet Commonly known as: VIBRA-TABS Take 1 tablet (100 mg total) by mouth every 12 (twelve) hours for 3 days.   fish oil-omega-3 fatty acids 1000 MG capsule Take 1 g by mouth daily.   fluticasone 50 MCG/ACT nasal spray Commonly known as: FLONASE Place 2 sprays into the nose daily.   HYDROcodone-acetaminophen 5-325 MG tablet Commonly known as: NORCO/VICODIN Take 1 tablet by mouth every 6 (six) hours as needed for moderate pain.   Loratadine 10 MG Caps Take 10 mg by mouth daily.   lovastatin 20 MG tablet Commonly known as: MEVACOR Take 20 mg by mouth daily.   multivitamin tablet Take 1 tablet by mouth daily.   niacin 500 MG CR tablet Commonly known as: NIASPAN Take 500 mg by mouth daily.   omeprazole 40 MG capsule Commonly known as: PRILOSEC Take 1 capsule (40 mg total) by mouth daily for 14 days.   pentoxifylline 400 MG CR tablet Commonly known as: TRENTAL Take 400 mg by mouth 3 (three) times daily.   valACYclovir 500 MG tablet Commonly known as: VALTREX Take 500 mg by mouth daily.      Procedures/Studies: CT Angio Chest PE W and/or Wo Contrast  Result Date: 06/02/2019 CLINICAL DATA:  Shortness of breath. EXAM: CT ANGIOGRAPHY CHEST WITH CONTRAST TECHNIQUE: Multidetector CT imaging of the chest was performed using the standard protocol during bolus administration of intravenous contrast. Multiplanar CT image reconstructions and MIPs were obtained to evaluate the vascular anatomy. CONTRAST:  158mL OMNIPAQUE IOHEXOL 350 MG/ML SOLN COMPARISON:  October 28, 2011 FINDINGS: Cardiovascular: Evaluation is limited by respiratory motion artifact.There is no pulmonary embolus. The main pulmonary artery is within normal limits for size. There is no CT evidence of acute right heart  strain. Atherosclerotic changes are noted of the thoracic aorta without evidence for an aneurysm. Heart size is normal. Coronary artery calcifications are noted. There is no significant pericardial effusion. Mediastinum/Nodes: --mildly enlarged mediastinal and hilar lymph nodes are noted. For example there is a precarinal lymph node measuring approximately 1.5 cm. This is increased from prior study in 2013 but is still favored to represent a reactive lymph node. --there are mildly enlarged axillary lymph nodes which are  relatively stable when compared to 2013 and are likely of no clinical significance. --No supraclavicular lymphadenopathy. --Normal thyroid gland. --The esophagus is unremarkable Lungs/Pleura: There are diffuse hazy ground-glass coarse airspace opacities bilaterally consistent with the patient's history of viral pneumonia. There is no pneumothorax. No large pleural effusion. The trachea is unremarkable. Upper Abdomen: The spleen appears to be at least mildly enlarged. The visualized is upper abdomen is grossly unremarkable. Musculoskeletal: No chest wall abnormality. No acute or significant osseous findings. Again noted are mildly enlarged lymph nodes in the region of the porta hepatis. Review of the MIP images confirms the above findings. IMPRESSION: 1. Evaluation for pulmonary emboli is limited by respiratory motion artifact. Given this limitation, no acute pulmonary embolism was detected. 2. Diffuse bilateral ground-glass airspace opacities consistent with the patient's history of COVID-19 pneumonia. 3. Probable borderline splenomegaly. Again noted are enlarged lymph nodes in the porta hepatis, similar to CT from 2017. 4. Presumed reactive mediastinal adenopathy is noted. Aortic Atherosclerosis (ICD10-I70.0). Electronically Signed   By: Constance Holster M.D.   On: 06/02/2019 17:18   DG Chest Port 1 View  Result Date: 06/02/2019 CLINICAL DATA:  Shortness of breath EXAM: PORTABLE CHEST 1 VIEW  COMPARISON:  05/09/2019 FINDINGS: Again noted are diffuse bilateral airspace opacities with some interval progression in the left mid and left lower lung zone. There is no pneumothorax. No large pleural effusion. The heart size is stable. Aortic calcifications are noted. IMPRESSION: Interval worsening of multifocal airspace opacities, most evident in the left mid and left lower lung zones. Findings are concerning for multifocal pneumonia (viral or bacterial). Electronically Signed   By: Constance Holster M.D.   On: 06/02/2019 16:13   DG Chest Port 1 View  Result Date: 05/09/2019 CLINICAL DATA:  Fever EXAM: PORTABLE CHEST 1 VIEW COMPARISON:  07/05/2015 FINDINGS: Patchy bilateral airspace disease. No Kerley lines, effusion, or pneumothorax. Normal heart size and aortic contours. Aortic atherosclerosis. IMPRESSION: Patchy bilateral pneumonia. Electronically Signed   By: Monte Fantasia M.D.   On: 05/09/2019 14:28      Subjective: Pt says he is feeling much better today and off oxygen now, wheezing resolved, he really wants to go home.    Discharge Exam: Vitals:   06/04/19 0230 06/04/19 0853  BP: 114/60   Pulse: 89   Resp: 18   Temp: 97.9 F (36.6 C)   SpO2: 94% 96%   Vitals:   06/03/19 2232 06/03/19 2241 06/04/19 0230 06/04/19 0853  BP: 125/61 113/67 114/60   Pulse: 98 92 89   Resp: 16 16 18    Temp: 98 F (36.7 C) 98.1 F (36.7 C) 97.9 F (36.6 C)   TempSrc: Oral Oral Oral   SpO2: 95% 94% 94% 96%  Weight:      Height:       General: Pt is alert, awake, not in acute distress Cardiovascular: RRR, S1/S2 +, no rubs, no gallops Respiratory: CTA bilaterally, no wheezing, no rhonchi Abdominal: Soft, NT, ND, bowel sounds + Extremities: no edema, no cyanosis   The results of significant diagnostics from this hospitalization (including imaging, microbiology, ancillary and laboratory) are listed below for reference.     Microbiology: No results found for this or any previous visit  (from the past 240 hour(s)).   Labs: BNP (last 3 results) No results for input(s): BNP in the last 8760 hours. Basic Metabolic Panel: Recent Labs  Lab 06/02/19 1537 06/03/19 0929 06/04/19 0440  NA 140 139 141  K 4.4 4.1 3.8  CL 106 105 107  CO2 25 23 26   GLUCOSE 110* 152* 112*  BUN 24* 21 27*  CREATININE 1.07 0.93 0.93  CALCIUM 8.9 8.9 8.6*  MG  --   --  1.8   Liver Function Tests: Recent Labs  Lab 06/02/19 1537 06/03/19 0929 06/04/19 0440  AST 22 24 17   ALT 14 17 15   ALKPHOS 102 103 82  BILITOT 0.6 0.5 0.7  PROT 6.3* 6.6 6.3*  ALBUMIN 3.6 3.8 3.2*   No results for input(s): LIPASE, AMYLASE in the last 168 hours. No results for input(s): AMMONIA in the last 168 hours. CBC: Recent Labs  Lab 06/02/19 1537 06/03/19 0929 06/04/19 0440  WBC 86.6* 97.6* 84.9*  NEUTROABS 5.5 6.4 7.1  HGB 13.4 13.4 11.8*  HCT 43.5 42.7 37.7*  MCV 95.8 93.8 94.7  PLT 168 170 171   Cardiac Enzymes: No results for input(s): CKTOTAL, CKMB, CKMBINDEX, TROPONINI in the last 168 hours. BNP: Invalid input(s): POCBNP CBG: No results for input(s): GLUCAP in the last 168 hours. D-Dimer Recent Labs    06/03/19 0046 06/03/19 0929  DDIMER 0.58* 0.47   Hgb A1c No results for input(s): HGBA1C in the last 72 hours. Lipid Profile No results for input(s): CHOL, HDL, LDLCALC, TRIG, CHOLHDL, LDLDIRECT in the last 72 hours. Thyroid function studies No results for input(s): TSH, T4TOTAL, T3FREE, THYROIDAB in the last 72 hours.  Invalid input(s): FREET3 Anemia work up Recent Labs    06/02/19 1537 06/03/19 0929  FERRITIN 95 103   Urinalysis    Component Value Date/Time   COLORURINE YELLOW 05/09/2019 1419   APPEARANCEUR CLEAR 05/09/2019 1419   LABSPEC 1.028 05/09/2019 1419   PHURINE 6.0 05/09/2019 1419   GLUCOSEU NEGATIVE 05/09/2019 1419   HGBUR NEGATIVE 05/09/2019 1419   [place] NEGATIVE 05/09/2019 1419   KETONESUR NEGATIVE 05/09/2019 1419   PROTEINUR 30 (A) 05/09/2019 1419    NITRITE NEGATIVE 05/09/2019 1419   LEUKOCYTESUR NEGATIVE 05/09/2019 1419   Sepsis Labs Invalid input(s): PROCALCITONIN,  WBC,  LACTICIDVEN Microbiology No results found for this or any previous visit (from the past 240 hour(s)).  Time coordinating discharge: 36 mins  SIGNED:  Irwin Brakeman, MD  [facility] 06/04/2019, 10:29 AM How to contact the [facility] Attending or Consulting provider [place] or covering provider during after hours [place], for this patient?  1. Check the care team in [facility] and look for a) attending/consulting TR[facility] provider listed and b) the [facility] team listed 2. Log into [email or web address] and use [place]'s universal password to access. If you do not have the password, please contact the hospital operator. 3. Locate the [facility] provider you are looking for under [facility] and page to a number that you can be directly reached. 4. If you still have difficulty reaching the provider, please page the [facility] (Director on Call) for the Hospitalists listed on amion for assistance.

## 2019-06-04 NOTE — Progress Notes (Signed)
Discharge instructions provided to patient and spouse. All medications reviewed. Follow up appointments reviewed. All questions answered at this time.

## 2019-06-04 NOTE — Evaluation (Signed)
Physical Therapy Evaluation Patient Details Name: Alfred Brown MRN: [id number] DOB: 1950-12-30 Today's Date: 06/04/2019   History of Present Illness  Alfred Brown  is a 69 y.o. male, with history of CLL, GERD, hyperlipidemia, COPD who was diagnosed with COVID-19 infection 3 weeks ago, and received outpatient IV monoclonal antibody at [facility].  Patient today went to his primary care physician for fever and shortness of breath.  Chest x-ray was consistent with pneumonia.  Patient was sent to ED for further evaluation.  In the ED CT chest showed multifocal pneumonia, worrisome for viral pneumonia.Patient has history of CLL, WBC count is 86,000.  He is followed by hematology/oncology at [facility] as outpatient.    Clinical Impression  Patient functioning at baseline for functional mobility and gait.  Plan:  Patient discharged from physical therapy to care of nursing for ambulation daily as tolerated for length of stay.     Follow Up Recommendations No PT follow up    Equipment Recommendations  None recommended by PT    Recommendations for Other Services       Precautions / Restrictions Precautions Precautions: None Restrictions Weight Bearing Restrictions: No      Mobility  Bed Mobility Overal bed mobility: Independent                Transfers Overall transfer level: Modified independent               General transfer comment: slightly increased time  Ambulation/Gait Ambulation/Gait assistance: Modified independent (Device/Increase time) Gait Distance (Feet): 200 Feet Assistive device: None Gait Pattern/deviations: WFL(Within Functional Limits) Gait velocity: slightly decreased   General Gait Details: demonstrates good return for ambulation on level, inclined and declined surfaces without loss of balance  Stairs            Wheelchair Mobility    Modified Rankin (Stroke Patients Only)       Balance Overall balance assessment: Independent                                            Pertinent Vitals/Pain Pain Assessment: No/denies pain    Home Living Family/patient expects to be discharged to:: Private residence Living Arrangements: Spouse/significant other Available Help at Discharge: Family;Available 24 hours/day Type of Home: Mobile home Home Access: Ramped entrance     Home Layout: One level Home Equipment: [place] - 2 wheels;Cane - single point;Shower seat;Bedside commode;Hospital bed Additional Comments: Patient states he has access to hospital bed from other family member if needed    Prior Function Level of Independence: Independent         Comments: Hydrographic surveyor, drives     Journalist, newspaper        Extremity/Trunk Assessment   Upper Extremity Assessment Upper Extremity Assessment: Overall WFL for tasks assessed    Lower Extremity Assessment Lower Extremity Assessment: Overall WFL for tasks assessed    Cervical / Trunk Assessment Cervical / Trunk Assessment: Normal  Communication   Communication: No difficulties  Cognition Arousal/Alertness: Awake/alert Behavior During Therapy: WFL for tasks assessed/performed Overall Cognitive Status: Within Functional Limits for tasks assessed                                        General Comments      Exercises  Assessment/Plan    PT Assessment Patent does not need any further PT services  PT Problem List         PT Treatment Interventions      PT Goals (Current goals can be found in the Care Plan section)  Acute Rehab PT Goals Patient Stated Goal: return home PT Goal Formulation: With patient Time For Goal Achievement: 06/04/19 Potential to Achieve Goals: Good    Frequency     Barriers to discharge        Co-evaluation               AM-PAC PT "6 Clicks" Mobility  Outcome Measure Help needed turning from your back to your side while in a flat bed without using bedrails?: None Help needed  moving from lying on your back to sitting on the side of a flat bed without using bedrails?: None Help needed moving to and from a bed to a chair (including a wheelchair)?: None Help needed standing up from a chair using your arms (e.g., wheelchair or bedside chair)?: None Help needed to walk in hospital room?: None Help needed climbing 3-5 steps with a railing? : None 6 Click Score: 24    End of Session   Activity Tolerance: Patient tolerated treatment well Patient left: in bed;with call bell/phone within reach(seated at bedside) Nurse Communication: Mobility status PT Visit Diagnosis: Unsteadiness on feet (R26.81);Other abnormalities of gait and mobility (R26.89);Muscle weakness (generalized) (M62.81)    Time: 4098-1191 PT Time Calculation (min) (ACUTE ONLY): 18 min   Charges:   PT Evaluation $PT Eval Low Complexity: 1 Low PT Treatments $Therapeutic Activity: 8-22 mins        8:46 AM, 06/04/19 Lonell Grandchild, MPT Physical Therapist with [facility] [phone] office [phone] mobile phone

## 2019-06-04 NOTE — Progress Notes (Signed)
Pt in bathroom

## 2019-06-04 NOTE — Discharge Instructions (Signed)
IMPORTANT INFORMATION: PAY CLOSE ATTENTION  ? ?PHYSICIAN DISCHARGE INSTRUCTIONS ? ?Follow with Primary care provider  Fusco, Lawrence, MD  and other consultants as instructed by your Hospitalist Physician ? ?SEEK MEDICAL CARE OR RETURN TO EMERGENCY ROOM IF SYMPTOMS COME BACK, WORSEN OR NEW PROBLEM DEVELOPS  ? ?Please note: ?You were cared for by a hospitalist during your hospital stay. Every effort will be made to forward records to your primary care provider.  You can request that your primary care provider send for your hospital records if they have not received them.  Once you are discharged, your primary care physician will handle any further medical issues. Please note that NO REFILLS for any discharge medications will be authorized once you are discharged, as it is imperative that you return to your primary care physician (or establish a relationship with a primary care physician if you do not have one) for your post hospital discharge needs so that they can reassess your need for medications and monitor your lab values. ? ?Please get a complete blood count and chemistry panel checked by your Primary MD at your next visit, and again as instructed by your Primary MD. ? ?Get Medicines reviewed and adjusted: ?Please take all your medications with you for your next visit with your Primary MD ? ?Laboratory/radiological data: ?Please request your Primary MD to go over all hospital tests and procedure/radiological results at the follow up, please ask your primary care provider to get all Hospital records sent to his/her office. ? ?In some cases, they will be blood work, cultures and biopsy results pending at the time of your discharge. Please request that your primary care provider follow up on these results. ? ?If you are diabetic, please bring your blood sugar readings with you to your follow up appointment with primary care.   ? ?Please call and make your follow up appointments as soon as possible.   ? ?Also Note  the following: ?If you experience worsening of your admission symptoms, develop shortness of breath, life threatening emergency, suicidal or homicidal thoughts you must seek medical attention immediately by calling 911 or calling your MD immediately  if symptoms less severe. ? ?You must read complete instructions/literature along with all the possible adverse reactions/side effects for all the Medicines you take and that have been prescribed to you. Take any new Medicines after you have completely understood and accpet all the possible adverse reactions/side effects.  ? ?Do not drive when taking Pain medications or sleeping medications (Benzodiazepines) ? ?Do not take more than prescribed Pain, Sleep and Anxiety Medications. It is not advisable to combine anxiety,sleep and pain medications without talking with your primary care practitioner ? ?Special Instructions: If you have smoked or chewed Tobacco  in the last 2 yrs please stop smoking, stop any regular Alcohol  and or any Recreational drug use. ? ?Wear Seat belts while driving.  Do not drive if taking any narcotic, mind altering or controlled substances or recreational drugs or alcohol.  ? ? ? ? ? ?

## 2019-06-10 DIAGNOSIS — K219 Gastro-esophageal reflux disease without esophagitis: Secondary | ICD-10-CM | POA: Diagnosis not present

## 2019-06-10 DIAGNOSIS — Z6823 Body mass index (BMI) 23.0-23.9, adult: Secondary | ICD-10-CM | POA: Diagnosis not present

## 2019-06-10 DIAGNOSIS — J189 Pneumonia, unspecified organism: Secondary | ICD-10-CM | POA: Diagnosis not present

## 2019-06-10 DIAGNOSIS — J449 Chronic obstructive pulmonary disease, unspecified: Secondary | ICD-10-CM | POA: Diagnosis not present

## 2019-06-22 ENCOUNTER — Ambulatory Visit (HOSPITAL_COMMUNITY)
Admission: RE | Admit: 2019-06-22 | Discharge: 2019-06-22 | Disposition: A | Discharge status: Home / Self Care | Payer: Medicare Other | Source: Ambulatory Visit | Attending: Hematology | Admitting: Hematology

## 2019-06-22 ENCOUNTER — Inpatient Hospital Stay (HOSPITAL_COMMUNITY): Payer: Medicare Other | Attending: Hematology | Admitting: Hematology

## 2019-06-22 ENCOUNTER — Inpatient Hospital Stay (HOSPITAL_COMMUNITY): Payer: Medicare Other

## 2019-06-22 ENCOUNTER — Other Ambulatory Visit: Payer: Self-pay

## 2019-06-22 ENCOUNTER — Encounter (HOSPITAL_COMMUNITY): Payer: Self-pay | Admitting: Hematology

## 2019-06-22 VITALS — BP 109/53 | HR 18 | Temp 96.8°F | Resp 18 | Wt 171.3 lb

## 2019-06-22 DIAGNOSIS — R0602 Shortness of breath: Secondary | ICD-10-CM | POA: Diagnosis not present

## 2019-06-22 DIAGNOSIS — Z8616 Personal history of COVID-19: Secondary | ICD-10-CM | POA: Insufficient documentation

## 2019-06-22 DIAGNOSIS — C9110 Chronic lymphocytic leukemia of B-cell type not having achieved remission: Secondary | ICD-10-CM | POA: Diagnosis not present

## 2019-06-22 DIAGNOSIS — D801 Nonfamilial hypogammaglobulinemia: Secondary | ICD-10-CM | POA: Insufficient documentation

## 2019-06-22 DIAGNOSIS — Z79899 Other long term (current) drug therapy: Secondary | ICD-10-CM | POA: Insufficient documentation

## 2019-06-22 DIAGNOSIS — F17210 Nicotine dependence, cigarettes, uncomplicated: Secondary | ICD-10-CM | POA: Diagnosis not present

## 2019-06-22 LAB — COMPREHENSIVE METABOLIC PANEL
ALT: 16 U/L (ref 0–44)
AST: 24 U/L (ref 15–41)
Albumin: 3.9 g/dL (ref 3.5–5.0)
Alkaline Phosphatase: 121 U/L (ref 38–126)
Anion gap: 7 (ref 5–15)
BUN: 22 mg/dL (ref 8–23)
CO2: 27 mmol/L (ref 22–32)
Calcium: 9.3 mg/dL (ref 8.9–10.3)
Chloride: 107 mmol/L (ref 98–111)
Creatinine, Ser: 1.15 mg/dL (ref 0.61–1.24)
GFR calc Af Amer: >60 mL/min (ref >60)
GFR calc non Af Amer: >60 mL/min (ref >60)
Glucose, Bld: 119 mg/dL — ABNORMAL HIGH (ref 70–99)
Potassium: 4.8 mmol/L (ref 3.5–5.1)
Sodium: 141 mmol/L (ref 135–145)
Total Bilirubin: 0.5 mg/dL (ref 0.3–1.2)
Total Protein: 6.9 g/dL (ref 6.5–8.1)

## 2019-06-22 LAB — CBC WITH DIFFERENTIAL/PLATELET
Abs Immature Granulocytes: 0.17 10*3/uL — ABNORMAL HIGH (ref 0.00–0.07)
Basophils Absolute: 0.1 10*3/uL (ref 0.0–0.1)
Basophils Relative: 0 %
Eosinophils Absolute: 0.2 10*3/uL (ref 0.0–0.5)
Eosinophils Relative: 0 %
HCT: 44.4 % (ref 39.0–52.0)
Hemoglobin: 13.5 g/dL (ref 13.0–17.0)
Immature Granulocytes: 0 %
Lymphocytes Relative: 93 %
Lymphs Abs: 91.3 10*3/uL — ABNORMAL HIGH (ref 0.7–4.0)
MCH: 29.2 pg (ref 26.0–34.0)
MCHC: 30.4 g/dL (ref 30.0–36.0)
MCV: 95.9 fL (ref 80.0–100.0)
Monocytes Absolute: 1.8 10*3/uL — ABNORMAL HIGH (ref 0.1–1.0)
Monocytes Relative: 2 %
Neutro Abs: 4.9 10*3/uL (ref 1.7–7.7)
Neutrophils Relative %: 5 %
Platelets: 158 10*3/uL (ref 150–400)
RBC: 4.63 MIL/uL (ref 4.22–5.81)
RDW: 14.9 % (ref 11.5–15.5)
WBC: 98.4 10*3/uL (ref 4.0–10.5)
nRBC: 0 % (ref 0.0–0.2)

## 2019-06-22 LAB — LACTATE DEHYDROGENASE: LDH: 233 U/L — ABNORMAL HIGH (ref 98–192)

## 2019-06-22 NOTE — Assessment & Plan Note (Signed)
1.  Stage II CLL by Rai system: -Diagnosed around 2010, observation since then. -Denies any fevers, night sweats or weight loss in the last 6 months. -Patient was diagnosed with COVID-19 on 03/07/2020.  He was also admitted in March for IVIG infusion. -I reviewed his labs today.  White count is 98.4 with 91% lymphocytes.  Hemoglobin and platelets are normal.  LDH is elevated to 33.  LFTs are normal. -Physical examination shows mild adenopathy in the neck region, posterior triangle which are stable. -She reported night sweats since discharge.  He had night sweats every night for 10 days and then they stopped.  He had night sweats for the last 2 nights.  He had to change to shake.  We will keep a close eye on it.  He also would be related to recent COVID-19 diagnosis. -He will come back in June for follow-up with repeat labs and physical exam.  2.  Hypogammaglobulinemia: -This is from underlying CLL.  Trough immunoglobulins are low at 3 50-400.  He did not have any recurrent infections. -However because of his recent COVID-19 infection, he received IVIG 400 mg/kg 1 dose on 06/03/2019.  3.  Shortness of breath on exertion: -he reports shortness of breath on exertion which has not improved since recent hospitalization. -I have done chest x-ray in the office today and reviewed it and compare it from chest x-ray from June 02, 2019.  Infiltrates have slightly improved.

## 2019-06-22 NOTE — Progress Notes (Signed)
[place], [place] [address]   CLINIC:  Medical Oncology/Hematology  PCP:  [facility], [address] [place] [address] [phone]   REASON FOR VISIT:  Follow-up for CLL and COVID-19 infection.  CURRENT THERAPY: Observation.   INTERVAL HISTORY:  [name] 69 y.o. male seen for follow-up of CLL.  He was recently hospitalized on 06/03/2019 for LDJTT-01 related complication.  He had to receive IVIG 400 mg/kg 1 dose.  Since discharge from the hospital, he reported night sweats, every night for 10 days and this stopped.  For the last 2 nights they have come back.  He had to change to shared.  Denies any weight loss or fevers associated with it.  Reports continued shortness of breath on exertion.  Appetite is 100%.  Energy levels are low.  Denies any cough or expectoration.    REVIEW OF SYSTEMS:  Review of Systems  Respiratory: Positive for shortness of breath.   All other systems reviewed and are negative.    PAST MEDICAL/SURGICAL HISTORY:  Past Medical History:  Diagnosis Date  . Aortic valve disorder    Mild insufficiency  . Chronic lymphocytic leukemia ([place])    01/2012: WBC-90.2, H&H-15.1/45.3, platelets-185 03/31/12: Verified with Dr. Tressie Stalker that no precautions or modification of medical regime are required prior to orthopaedic surgery.   . CLL (chronic lymphocytic leukemia) ([place])   . CMV (cytomegalovirus) ([place])   . COPD (chronic obstructive pulmonary disease) ([place])   . Depression with anxiety   . DJD (degenerative joint disease)   . GERD (gastroesophageal reflux disease)   . Hyperlipidemia   . Lipoma    left chest wall  . Palpitations 2004   PVCs; borderline stress nuclear in 2004, negative in 2007; Echo 2007; AV-sclerotic, very mild AI  . Right bundle branch block    + left posterior fascicular block  . Tobacco abuse    80 pack years   Past Surgical History:  Procedure Laterality Date  . COLONOSCOPY   01/2012   Negative screening study  . GANGLION CYST EXCISION  1997   left wrist  . ROTATOR CUFF REPAIR  1997   right  . SHOULDER ARTHROSCOPY WITH SUBACROMIAL DECOMPRESSION  04/09/2012   Procedure: SHOULDER ARTHROSCOPY WITH SUBACROMIAL DECOMPRESSION;  Surgeon: Ninetta Lights, MD;  Location: [facility];  Service: Orthopedics;  Laterality: Left;  LEFT SHOULDER ARTHROSCOPY, SUBACROMIAL DECOMPRESSION, PARTIAL ACROMIOPLASTY WITH CORACOACROMIAL RELEASE, DISTAL CLAVICULECTOMY WITH ROTATOR CUFF REPAIR, DEBRIDEMENT OF LABRUM     SOCIAL HISTORY:  Social History   Socioeconomic History  . Marital status: Married    Spouse name: Not on file  . Number of children: Not on file  . Years of education: Not on file  . Highest education level: Not on file  Occupational History  . Not on file  Tobacco Use  . Smoking status: Current Every Day Smoker    Packs/day: 1.00    Years: 45.00    Pack years: 45.00    Types: Cigarettes  . Smokeless tobacco: Never Used  . Tobacco comment: Consumption decreased approximately 2010 to one pack per day  Substance and Sexual Activity  . Alcohol use: No    Alcohol/week: 0.0 standard drinks  . Drug use: No  . Sexual activity: Not on file  Other Topics Concern  . Not on file  Social History Narrative  . Not on file   Social Determinants of Health   Financial Resource Strain:   .  Difficulty of Paying Living Expenses:   Food Insecurity:   . Worried About Charity fundraiser in the Last Year:   . Arboriculturist in the Last Year:   Transportation Needs:   . Film/video editor (Medical):   [name] Lack of Transportation (Non-Medical):   Physical Activity:   . Days of Exercise per Week:   . Minutes of Exercise per Session:   Stress:   . Feeling of Stress :   Social Connections:   . Frequency of Communication with Friends and Family:   . Frequency of Social Gatherings with Friends and Family:   . Attends Religious Services:   . Active Member  of Clubs or Organizations:   . Attends Archivist Meetings:   [name] Marital Status:   Intimate Partner Violence:   . Fear of Current or Ex-Partner:   . Emotionally Abused:   [name] Physically Abused:   . Sexually Abused:     FAMILY HISTORY:  Family History  Problem Relation Age of Onset  . Stroke Mother   . Heart attack Father   . Cancer Sister        colon  . Colon cancer Sister   . Cancer Brother        2 brothers died with lung cancer    CURRENT MEDICATIONS:  Outpatient Encounter Medications as of 06/22/2019  Medication Sig  . ALPRAZolam (XANAX) 1 MG tablet Take 1 mg by mouth 3 (three) times daily as needed for anxiety.   [name] aspirin 325 MG tablet Take 325 mg by mouth daily.    [name] buPROPion (WELLBUTRIN SR) 150 MG 12 hr tablet Take 150 mg by mouth daily.   . calcium carbonate (OS-CAL) 600 MG TABS Take 600 mg by mouth daily.    . fish oil-omega-3 fatty acids 1000 MG capsule Take 1 g by mouth daily.   . fluticasone (FLONASE) 50 MCG/ACT nasal spray Place 2 sprays into the nose daily.    . Loratadine 10 MG CAPS Take 10 mg by mouth daily.   [name] lovastatin (MEVACOR) 20 MG tablet Take 20 mg by mouth daily.  . Multiple Vitamin (MULTIVITAMIN) tablet Take 1 tablet by mouth daily.    . niacin (NIASPAN) 500 MG CR tablet Take 500 mg by mouth daily.  [name] omeprazole (PRILOSEC) 40 MG capsule Take 1 capsule (40 mg total) by mouth daily for 14 days.  . pentoxifylline (TRENTAL) 400 MG CR tablet Take 400 mg by mouth 3 (three) times daily.  . valACYclovir (VALTREX) 500 MG tablet Take 500 mg by mouth daily.  [name] albuterol (PROVENTIL HFA;VENTOLIN HFA) 108 (90 BASE) MCG/ACT inhaler Inhale 2 puffs into the lungs every 6 (six) hours as needed for wheezing or shortness of breath.   . clotrimazole-betamethasone (LOTRISONE) cream Apply 1 application topically daily as needed. Affected area  . HYDROcodone-acetaminophen (NORCO/VICODIN) 5-325 MG per tablet Take 1 tablet by mouth every 6 (six) hours as needed for  moderate pain.   . [DISCONTINUED] acetaminophen (TYLENOL) 325 MG tablet Take 650 mg by mouth every 6 (six) hours as needed for mild pain, fever or headache.  . [DISCONTINUED] dexamethasone (DECADRON) 4 MG tablet    No facility-administered encounter medications on file as of 06/22/2019.    ALLERGIES:  No Known Allergies   PHYSICAL EXAM:  ECOG Performance status: 1  Vitals:   06/22/19 1151  BP: (!) 109/53  Pulse: (!) 18  Resp: 18  Temp: (!) 96.8 F (36 C)  SpO2: 97%  Filed Weights   06/22/19 1151  Weight: 171 lb 4.8 oz (77.7 kg)    Physical Exam Vitals reviewed.  Constitutional:      Appearance: Normal appearance.  Cardiovascular:     Rate and Rhythm: Normal rate and regular rhythm.     Heart sounds: Normal heart sounds.  Pulmonary:     Effort: Pulmonary effort is normal.     Breath sounds: Normal breath sounds.  Abdominal:     General: There is no distension.     Palpations: Abdomen is soft. There is no mass.  Lymphadenopathy:     Cervical: Cervical adenopathy present.  Skin:    General: Skin is warm.  Neurological:     General: No focal deficit present.     Mental Status: He is alert and oriented to person, place, and time.  Psychiatric:        Mood and Affect: Mood normal.        Behavior: Behavior normal.      LABORATORY DATA:  I have reviewed the labs as listed.  CBC    Component Value Date/Time   WBC 98.4 (HH) 06/22/2019 1301   RBC 4.63 06/22/2019 1301   HGB 13.5 06/22/2019 1301   HCT 44.4 06/22/2019 1301   PLT 158 06/22/2019 1301   MCV 95.9 06/22/2019 1301   MCH 29.2 06/22/2019 1301   MCHC 30.4 06/22/2019 1301   RDW 14.9 06/22/2019 1301   LYMPHSABS 91.3 (H) 06/22/2019 1301   MONOABS 1.8 (H) 06/22/2019 1301   EOSABS 0.2 06/22/2019 1301   BASOSABS 0.1 06/22/2019 1301   CMP Latest Ref Rng & Units 06/22/2019 06/04/2019 06/03/2019  Glucose 70 - 99 mg/dL 119(H) 112(H) 152(H)  BUN 8 - 23 mg/dL 22 27(H) 21  Creatinine 0.61 - 1.24 mg/dL 1.15 0.93  0.93  Sodium 135 - 145 mmol/L 141 141 139  Potassium 3.5 - 5.1 mmol/L 4.8 3.8 4.1  Chloride 98 - 111 mmol/L 107 107 105  CO2 22 - 32 mmol/L 27 26 23   Calcium 8.9 - 10.3 mg/dL 9.3 8.6(L) 8.9  Total Protein 6.5 - 8.1 g/dL 6.9 6.3(L) 6.6  Total Bilirubin 0.3 - 1.2 mg/dL 0.5 0.7 0.5  Alkaline Phos 38 - 126 U/L 121 82 103  AST 15 - 41 U/L 24 17 24   ALT 0 - 44 U/L 16 15 17        DIAGNOSTIC IMAGING:  I have independently reviewed the scans and discussed with the patient.    ASSESSMENT & PLAN:   Chronic lymphocytic leukemia ([place]) 1.  Stage II CLL by Rai system: -Diagnosed around 2010, observation since then. -Denies any fevers, night sweats or weight loss in the last 6 months. -Patient was diagnosed with COVID-19 on 03/07/2020.  He was also admitted in March for IVIG infusion. -I reviewed his labs today.  White count is 98.4 with 91% lymphocytes.  Hemoglobin and platelets are normal.  LDH is elevated to 33.  LFTs are normal. -Physical examination shows mild adenopathy in the neck region, posterior triangle which are stable. -She reported night sweats since discharge.  He had night sweats every night for 10 days and then they stopped.  He had night sweats for the last 2 nights.  He had to change to shake.  We will keep a close eye on it.  He also would be related to recent COVID-19 diagnosis. -He will come back in June for follow-up with repeat labs and physical exam.  2.  Hypogammaglobulinemia: -This is from underlying  CLL.  Trough immunoglobulins are low at 3 50-400.  He did not have any recurrent infections. -However because of his recent COVID-19 infection, he received IVIG 400 mg/kg 1 dose on 06/03/2019.  3.  Shortness of breath on exertion: -he reports shortness of breath on exertion which has not improved since recent hospitalization. -I have done chest x-ray in the office today and reviewed it and compare it from chest x-ray from June 02, 2019.  Infiltrates have slightly  improved.      Orders placed this encounter:  Orders Placed This Encounter  Procedures  . DG Chest 2 View  . CBC with Differential  . Comprehensive metabolic panel  . Lactate dehydrogenase   Total time spent is 30 minutes with more than 50% of the time spent face-to-face discussing and reviewing hospital records, reviewing images, counseling and coordination of care.   Derek Jack, MD [place] [phone]

## 2019-06-22 NOTE — Progress Notes (Signed)
CRITICAL VALUE ALERT  Critical Value:  WBC 98.4  Date & Time Notied:  06/22/2019 at 1344  Provider Notified: Dr. Delton Coombes  Orders Received/Actions taken: n/a

## 2019-06-22 NOTE — Patient Instructions (Addendum)
[place] at [facility] Discharge Instructions  You were seen today by Dr. Delton Coombes. He went over your recent lab results. You will have xray and labs today prior to leaving. He will see you back in June for labs and follow up.   Thank you for choosing [place] at [facility] to provide your oncology and hematology care.  To afford each patient quality time with our provider, please arrive at least 15 minutes before your scheduled appointment time.   If you have a lab appointment with the [facility] please come in thru the  Main Entrance and check in at the main information desk  You need to re-schedule your appointment should you arrive 10 or more minutes late.  We strive to give you quality time with our providers, and arriving late affects you and other patients whose appointments are after yours.  Also, if you no show three or more times for appointments you may be dismissed from the clinic at the providers discretion.     Again, thank you for choosing [facility].  Our hope is that these requests will decrease the amount of time that you wait before being seen by our physicians.       _____________________________________________________________  Should you have questions after your visit to [facility], please contact our office at [phone] between the hours of 8:00 a.m. and 4:30 p.m.  Voicemails left after 4:00 p.m. will not be returned until the following business day.  For prescription refill requests, have your pharmacy contact our office and allow 72 hours.    Cancer Center Support Programs:   > Cancer Support Group  2nd Tuesday of the month 1pm-2pm, Journey Room

## 2019-06-30 DIAGNOSIS — Z720 Tobacco use: Secondary | ICD-10-CM | POA: Diagnosis not present

## 2019-06-30 DIAGNOSIS — M1991 Primary osteoarthritis, unspecified site: Secondary | ICD-10-CM | POA: Diagnosis not present

## 2019-06-30 DIAGNOSIS — C9190 Lymphoid leukemia, unspecified not having achieved remission: Secondary | ICD-10-CM | POA: Diagnosis not present

## 2019-06-30 DIAGNOSIS — J449 Chronic obstructive pulmonary disease, unspecified: Secondary | ICD-10-CM | POA: Diagnosis not present

## 2019-07-01 ENCOUNTER — Telehealth (HOSPITAL_COMMUNITY): Payer: Self-pay | Admitting: Surgery

## 2019-07-01 DIAGNOSIS — M65311 Trigger thumb, right thumb: Secondary | ICD-10-CM | POA: Diagnosis not present

## 2019-07-01 DIAGNOSIS — M1991 Primary osteoarthritis, unspecified site: Secondary | ICD-10-CM | POA: Diagnosis not present

## 2019-07-01 DIAGNOSIS — R0600 Dyspnea, unspecified: Secondary | ICD-10-CM | POA: Diagnosis not present

## 2019-07-01 DIAGNOSIS — J449 Chronic obstructive pulmonary disease, unspecified: Secondary | ICD-10-CM | POA: Diagnosis not present

## 2019-07-01 DIAGNOSIS — J1282 Pneumonia due to coronavirus disease 2019: Secondary | ICD-10-CM | POA: Diagnosis not present

## 2019-07-01 DIAGNOSIS — Z6823 Body mass index (BMI) 23.0-23.9, adult: Secondary | ICD-10-CM | POA: Diagnosis not present

## 2019-07-01 DIAGNOSIS — R0789 Other chest pain: Secondary | ICD-10-CM | POA: Diagnosis not present

## 2019-07-01 NOTE — Telephone Encounter (Signed)
Pt's wife left a voicemail stating that Dr. Gerarda Fraction went over the pt's recent chest x ray and labs, and that Dr. Gerarda Fraction recommends that the pt see a pulmonologist since he has had pneumonia for a while.  The pt is also scheduled to see Dr. Harl Bowie on April 12.  The pt's wife wanted to make sure that Dr. Delton Coombes was aware of the upcoming appointments.  I sent a message to [facility] and Dr. Delton Coombes explaining the pt's situation and the upcoming appointments.  I called the pt's wife back and let her know that Dr. Delton Coombes has been made aware and she verbalized understanding.  She was told to call back if they had any questions or concerns.

## 2019-07-12 ENCOUNTER — Other Ambulatory Visit: Payer: Self-pay

## 2019-07-12 ENCOUNTER — Encounter: Payer: Self-pay | Admitting: Cardiology

## 2019-07-12 ENCOUNTER — Ambulatory Visit (INDEPENDENT_AMBULATORY_CARE_PROVIDER_SITE_OTHER): Payer: Medicare Other | Admitting: Cardiology

## 2019-07-12 ENCOUNTER — Telehealth: Payer: Self-pay | Admitting: Cardiology

## 2019-07-12 VITALS — BP 138/64 | HR 84 | Ht 73.0 in | Wt 172.4 lb

## 2019-07-12 DIAGNOSIS — J449 Chronic obstructive pulmonary disease, unspecified: Secondary | ICD-10-CM

## 2019-07-12 DIAGNOSIS — R0602 Shortness of breath: Secondary | ICD-10-CM | POA: Diagnosis not present

## 2019-07-12 DIAGNOSIS — I350 Nonrheumatic aortic (valve) stenosis: Secondary | ICD-10-CM | POA: Diagnosis not present

## 2019-07-12 DIAGNOSIS — R002 Palpitations: Secondary | ICD-10-CM | POA: Diagnosis not present

## 2019-07-12 NOTE — Addendum Note (Signed)
Addended by: Laurine Blazer on: 07/12/2019 02:07 PM   Modules accepted: Orders

## 2019-07-12 NOTE — Telephone Encounter (Signed)
°  Precert needed for: ECHO   Location: [facility]    Date:  Aug 12, 2019

## 2019-07-12 NOTE — Progress Notes (Signed)
Clinical Summary Mr. Fahl is a 69 y.o.male seen today for follow up of the following medical problems.   1. Papitations - long history of symtpoms. -2017 monitor with just PACs  -mild infrquent symptoms at times.   2.Possible Bicuspid aortic valve/ Aortic stenosis - somewhat questionable on prior imaging - CTA chest 12/2004 without significant aortopathy. - 2019 echo LVEF 60-65%, mild to mod AS (mean grad 17, AVA VTI 1.4)  - some ongoing SOB/DOE since his admission with pneumonia and COPD exacerbation last month  3. CLL - followed at cancer center  4. COVID 19 - diagnosed in 05/2019, received outpatient monoclonal Ab  5. COPD - admit 05/2019 with COPD exacerbation, pneumonia - breathing improving but not yet normal - uses albuterol just prn.  - he does not recall having formal PFTs. Has not been on a maintence inhaler.    Past Medical History:  Diagnosis Date  . Aortic valve disorder    Mild insufficiency  . Chronic lymphocytic leukemia ([place])    01/2012: WBC-90.2, H&H-15.1/45.3, platelets-185 03/31/12: Verified with Dr. Tressie Stalker that no precautions or modification of medical regime are required prior to orthopaedic surgery.   . CLL (chronic lymphocytic leukemia) ([place])   . CMV (cytomegalovirus) ([place])   . COPD (chronic obstructive pulmonary disease) ([place])   . Depression with anxiety   . DJD (degenerative joint disease)   . GERD (gastroesophageal reflux disease)   . Hyperlipidemia   . Lipoma    left chest wall  . Palpitations 2004   PVCs; borderline stress nuclear in 2004, negative in 2007; Echo 2007; AV-sclerotic, very mild AI  . Right bundle Amiracle Neises block    + left posterior fascicular block  . Tobacco abuse    80 pack years     No Known Allergies   Current Outpatient Medications  Medication Sig Dispense Refill  . albuterol (PROVENTIL HFA;VENTOLIN HFA) 108 (90 BASE) MCG/ACT inhaler Inhale 2 puffs into the lungs every 6 (six) hours as needed  for wheezing or shortness of breath.     . ALPRAZolam (XANAX) 1 MG tablet Take 1 mg by mouth 3 (three) times daily as needed for anxiety.     [name] aspirin 325 MG tablet Take 325 mg by mouth daily.      [name] buPROPion (WELLBUTRIN SR) 150 MG 12 hr tablet Take 150 mg by mouth daily.     . calcium carbonate (OS-CAL) 600 MG TABS Take 600 mg by mouth daily.      . clotrimazole-betamethasone (LOTRISONE) cream Apply 1 application topically daily as needed. Affected area    . fish oil-omega-3 fatty acids 1000 MG capsule Take 1 g by mouth daily.     . fluticasone (FLONASE) 50 MCG/ACT nasal spray Place 2 sprays into the nose daily.      [name] HYDROcodone-acetaminophen (NORCO/VICODIN) 5-325 MG per tablet Take 1 tablet by mouth every 6 (six) hours as needed for moderate pain.     . Loratadine 10 MG CAPS Take 10 mg by mouth daily.     [name] lovastatin (MEVACOR) 20 MG tablet Take 20 mg by mouth daily.    . Multiple Vitamin (MULTIVITAMIN) tablet Take 1 tablet by mouth daily.      . niacin (NIASPAN) 500 MG CR tablet Take 500 mg by mouth daily.    [name] omeprazole (PRILOSEC) 40 MG capsule Take 1 capsule (40 mg total) by mouth daily for 14 days. 14 capsule 0  . pentoxifylline (TRENTAL) 400 MG CR tablet Take  400 mg by mouth 3 (three) times daily.    . valACYclovir (VALTREX) 500 MG tablet Take 500 mg by mouth daily.     No current facility-administered medications for this visit.     Past Surgical History:  Procedure Laterality Date  . COLONOSCOPY  01/2012   Negative screening study  . GANGLION CYST EXCISION  1997   left wrist  . ROTATOR CUFF REPAIR  1997   right  . SHOULDER ARTHROSCOPY WITH SUBACROMIAL DECOMPRESSION  04/09/2012   Procedure: SHOULDER ARTHROSCOPY WITH SUBACROMIAL DECOMPRESSION;  Surgeon: Ninetta Lights, MD;  Location: [facility];  Service: Orthopedics;  Laterality: Left;  LEFT SHOULDER ARTHROSCOPY, SUBACROMIAL DECOMPRESSION, PARTIAL ACROMIOPLASTY WITH CORACOACROMIAL RELEASE, DISTAL  CLAVICULECTOMY WITH ROTATOR CUFF REPAIR, DEBRIDEMENT OF LABRUM     No Known Allergies    Family History  Problem Relation Age of Onset  . Stroke Mother   . Heart attack Father   . Cancer Sister        colon  . Colon cancer Sister   . Cancer Brother        2 brothers died with lung cancer     Social History Mr. Ida reports that he has been smoking cigarettes. He has a 45.00 pack-year smoking history. He has never used smokeless tobacco. Mr. Kersten reports no history of alcohol use.   Review of Systems CONSTITUTIONAL: No weight loss, fever, chills, weakness or fatigue.  HEENT: Eyes: No visual loss, blurred vision, double vision or yellow sclerae.No hearing loss, sneezing, congestion, runny nose or sore throat.  SKIN: No rash or itching.  CARDIOVASCULAR: per hpi RESPIRATORY: per hpi GASTROINTESTINAL: No anorexia, nausea, vomiting or diarrhea. No abdominal pain or blood.  GENITOURINARY: No burning on urination, no polyuria NEUROLOGICAL: No headache, dizziness, syncope, paralysis, ataxia, numbness or tingling in the extremities. No change in bowel or bladder control.  MUSCULOSKELETAL: No muscle, back pain, joint pain or stiffness.  LYMPHATICS: No enlarged nodes. No history of splenectomy.  PSYCHIATRIC: No history of depression or anxiety.  ENDOCRINOLOGIC: No reports of sweating, cold or heat intolerance. No polyuria or polydipsia.  [name]   Physical Examination Today's Vitals   07/12/19 1333  BP: 138/64  Pulse: 84  SpO2: 96%  Weight: 172 lb 6.4 oz (78.2 kg)  Height: 6\' 1"  (1.854 m)   Body mass index is 22.75 kg/m.  Gen: resting comfortably, no acute distress HEENT: no scleral icterus, pupils equal round and reactive, no palptable cervical adenopathy,  CV: RRR, no m/r/g, no jvd Resp: Clear to auscultation bilaterally GI: abdomen is soft, non-tender, non-distended, normal bowel sounds, no hepatosplenomegaly MSK: extremities are warm, no edema.  Skin: warm, no  rash Neuro:  no focal deficits Psych: appropriate affect   Diagnostic Studies Jan 2021 carotid US IMPRESSION: 1. Bilateral carotid bifurcation plaque resulting in less than 50% diameter ICA stenosis. 2. Antegrade bilateral vertebral arterial flow.    Assessment and Plan   1. Palpitaitons -previous monitor with just occasional PACs, no significant arrhythmias - mild infrequent symptoms, continue to monitor  2. Possible Bicuspid aortic valve/ Aortic stenosis - mild to moderate AS by 2019 echo - due for repeat study, will order echo  3. COPD - has not had formal PFTs, not on maintence inhaler - some ongoing SOB since recent admission with pneumonia and COPD exacerbation - obtain formal PFTs, likely refer to pulmonary after   F/u 6 months    Arnoldo Lenis, M.D.

## 2019-07-12 NOTE — Patient Instructions (Signed)
Medication Instructions:  Continue all current medications.  Labwork: none  Testing/Procedures:  Your physician has requested that you have an echocardiogram. Echocardiography is a painless test that uses sound waves to create images of your heart. It provides your doctor with information about the size and shape of your heart and how well your heart's chambers and valves are working. This procedure takes approximately one hour. There are no restrictions for this procedure.  Your physician has recommended that you have a pulmonary function test. Pulmonary Function Tests are a group of tests that measure how well air moves in and out of your lungs.  Office will contact with results via phone or letter.    Follow-Up: 6 months   Any Other Special Instructions Will Be Listed Below (If Applicable).  If you need a refill on your cardiac medications before your next appointment, please call your pharmacy.

## 2019-07-14 ENCOUNTER — Telehealth: Payer: Self-pay | Admitting: Cardiology

## 2019-07-14 NOTE — Telephone Encounter (Signed)
  Precert needed for: PFT  Location:   [facility]  Date: Aug 03, 2019

## 2019-07-30 ENCOUNTER — Other Ambulatory Visit (HOSPITAL_COMMUNITY)
Admission: RE | Admit: 2019-07-30 | Discharge: 2019-07-30 | Disposition: A | Discharge status: Home / Self Care | Payer: Medicare Other | Source: Ambulatory Visit | Attending: Cardiology | Admitting: Cardiology

## 2019-07-30 ENCOUNTER — Other Ambulatory Visit: Payer: Self-pay

## 2019-07-30 DIAGNOSIS — Z20822 Contact with and (suspected) exposure to covid-19: Secondary | ICD-10-CM | POA: Insufficient documentation

## 2019-07-30 DIAGNOSIS — Z01812 Encounter for preprocedural laboratory examination: Secondary | ICD-10-CM | POA: Insufficient documentation

## 2019-07-30 DIAGNOSIS — J449 Chronic obstructive pulmonary disease, unspecified: Secondary | ICD-10-CM | POA: Diagnosis not present

## 2019-07-30 DIAGNOSIS — M1991 Primary osteoarthritis, unspecified site: Secondary | ICD-10-CM | POA: Diagnosis not present

## 2019-07-30 DIAGNOSIS — C9190 Lymphoid leukemia, unspecified not having achieved remission: Secondary | ICD-10-CM | POA: Diagnosis not present

## 2019-07-30 DIAGNOSIS — Z720 Tobacco use: Secondary | ICD-10-CM | POA: Diagnosis not present

## 2019-07-31 LAB — SARS CORONAVIRUS 2 (TAT 6-24 HRS): SARS Coronavirus 2: NEGATIVE

## 2019-08-03 ENCOUNTER — Ambulatory Visit (HOSPITAL_COMMUNITY)
Admission: RE | Admit: 2019-08-03 | Discharge: 2019-08-03 | Disposition: A | Discharge status: Home / Self Care | Payer: Medicare Other | Source: Ambulatory Visit | Attending: Cardiology | Admitting: Cardiology

## 2019-08-03 ENCOUNTER — Other Ambulatory Visit: Payer: Self-pay

## 2019-08-03 ENCOUNTER — Telehealth: Payer: Self-pay | Admitting: *Deleted

## 2019-08-03 DIAGNOSIS — R0602 Shortness of breath: Secondary | ICD-10-CM | POA: Diagnosis not present

## 2019-08-03 LAB — PULMONARY FUNCTION TEST
DL/VA % pred: 71 %
DL/VA: 2.88 ml/min/mmHg/L
DLCO unc % pred: 63 %
DLCO unc: 18.28 ml/min/mmHg
FEF 25-75 Post: 3.98 L/sec
FEF 25-75 Pre: 2.72 L/sec
FEF2575-%Change-Post: 46 %
FEF2575-%Pred-Post: 136 %
FEF2575-%Pred-Pre: 93 %
FEV1-%Change-Post: 9 %
FEV1-%Pred-Post: 96 %
FEV1-%Pred-Pre: 88 %
FEV1-Post: 3.64 L
FEV1-Pre: 3.34 L
FEV1FVC-%Change-Post: 0 %
FEV1FVC-%Pred-Pre: 101 %
FEV6-%Change-Post: 8 %
FEV6-%Pred-Post: 97 %
FEV6-%Pred-Pre: 89 %
FEV6-Post: 4.66 L
FEV6-Pre: 4.28 L
FEV6FVC-%Change-Post: 0 %
FEV6FVC-%Pred-Post: 102 %
FEV6FVC-%Pred-Pre: 102 %
FVC-%Change-Post: 8 %
FVC-%Pred-Post: 94 %
FVC-%Pred-Pre: 87 %
FVC-Post: 4.78 L
FVC-Pre: 4.42 L
Post FEV1/FVC ratio: 76 %
Post FEV6/FVC ratio: 97 %
Pre FEV1/FVC ratio: 76 %
Pre FEV6/FVC Ratio: 97 %
RV % pred: 134 %
RV: 3.44 L
TLC % pred: 104 %
TLC: 7.96 L

## 2019-08-03 MED ORDER — ALBUTEROL SULFATE (2.5 MG/3ML) 0.083% IN NEBU
2.5000 mg | INHALATION_SOLUTION | Freq: Once | RESPIRATORY_TRACT | Status: AC
  Administered 2019-08-03: 2.5 mg via RESPIRATORY_TRACT

## 2019-08-03 NOTE — Telephone Encounter (Signed)
Pt PFT was today - pt already made aware

## 2019-08-03 NOTE — Telephone Encounter (Signed)
-----   Message from Arnoldo Lenis, MD sent at 08/03/2019  9:54 AM EDT ----- Negative COVID test  Zandra Abts MD

## 2019-08-05 ENCOUNTER — Ambulatory Visit (INDEPENDENT_AMBULATORY_CARE_PROVIDER_SITE_OTHER): Payer: Medicare Other | Admitting: Internal Medicine

## 2019-08-05 ENCOUNTER — Encounter: Payer: Self-pay | Admitting: Internal Medicine

## 2019-08-05 ENCOUNTER — Other Ambulatory Visit: Payer: Self-pay

## 2019-08-05 DIAGNOSIS — J1282 Pneumonia due to coronavirus disease 2019: Secondary | ICD-10-CM

## 2019-08-05 DIAGNOSIS — I359 Nonrheumatic aortic valve disorder, unspecified: Secondary | ICD-10-CM

## 2019-08-05 DIAGNOSIS — C9110 Chronic lymphocytic leukemia of B-cell type not having achieved remission: Secondary | ICD-10-CM

## 2019-08-05 DIAGNOSIS — U071 COVID-19: Secondary | ICD-10-CM | POA: Diagnosis not present

## 2019-08-05 DIAGNOSIS — J449 Chronic obstructive pulmonary disease, unspecified: Secondary | ICD-10-CM | POA: Diagnosis not present

## 2019-08-05 NOTE — Patient Instructions (Addendum)
Don't do any more exertion than you have been at home until you have until you are cleared by your heart doctor - then follow his guidelines for more exercise but you don't have significant COPD so you should do fine as you continue to recover from COVID 19 pneumonia.   Try albuterol 15 min before an activity that you know would make you short of breath and see if it makes any difference -  if makes none then don't take it after activity unless you can't catch your breath.  Work on inhaler technique:  relax and gently blow all the way out then take a nice smooth deep breath back in, triggering the inhaler at same time you start breathing in.  Hold for up to 5 seconds if you can     Make sure you check your oxygen saturations at highest level of activity to be sure it stays over 90%  Let me know if the the trend is down over time as it should gradually improve   Strongly recommend the prevar 13 and the covid vaccination  - discuss with your hematologist  Please schedule a follow up office visit in 6 weeks, call sooner if needed with cxr 1st to follow up the COVID 19 pneumonia.

## 2019-08-05 NOTE — Assessment & Plan Note (Signed)
Strongly advised keep up with vaccinations, esp covid and prevnar > discuss with hematologist

## 2019-08-05 NOTE — Assessment & Plan Note (Addendum)
F/u with cards planned, in meantime continue same pace exercise until approved for higher levels by cards    Each maintenance medication was reviewed in detail including emphasizing most importantly the difference between maintenance and prns and under what circumstances the prns are to be triggered using an action plan format where appropriate.  Total time for H and P, chart review including extensive inpt records/images, counseling,  directly observing portions of ambulatory 02 saturation study/  and generating customized AVS unique to this office visit / charting = 60 m

## 2019-08-05 NOTE — Progress Notes (Signed)
Alfred Brown, male    DOB: Apr 07, 1950,     MRN: [id number]   Brief patient profile:  35 yowm quit smoking June 02 2019  on 2006  On disability for CLL with dx of COPD at  [facility] around 2010 and once or twice weekly needing albuterol and seemed to help cough then covid dx 05/09/19 rx antibody did seem to help  then   Admit date: 06/02/2019 Discharge date: 06/04/2019  Admitted From:  Home  Disposition:  Home   Recommendations for Outpatient Follow-up:  1. Follow up with PCP in 1 weeks 2. Please obtain BMP/CBC in one week 3. Please follow up on the following pending results:  Discharge Condition: STABLE   CODE STATUS: FULL    Brief Hospitalization Summary: Please see all hospital notes, images, labs for full details of the hospitalization.  ADMISSION HPI:  JimmyDurhamis a68 y.o.male,with history of CLL, GERD, hyperlipidemia, COPD who was diagnosed with COVID-19 infection 3 weeks ago, and received outpatient IV monoclonal antibody at [facility]. Patient today went to his primary care physician for fever and shortness of breath. Chest x-ray was consistent with pneumonia. Patient was sent to ED for further evaluation. In the ED CT chest showed multifocal pneumonia, worrisome for viral pneumonia. Patient has history of CLL, WBC count is 86,000. He is followed by hematology/oncology at [facility] as outpatient. He denies nausea vomiting or diarrhea. Patient said that he started having fever last week Also complains of shortness of breath. In the ED patient was put on 2 L/min of oxygen.  He denies abdominal pain, dysuria   Brief Admission Hx: 68 y.o.male,with history of CLL, GERD, hyperlipidemia, COPD who was diagnosed with COVID-19 infectiondiagnosed 2/7/21and received outpatient IV monoclonal antibody at [facility] done well after treatments. In last couple of days presents with increasing SOB, fever, found to have pneumonia on CXR and new oxygen requirement.   MDM/Assessment &  Plan:  1. Acute   respiratory failure with hypoxia - Pt presented with a new oxygen requirement. He has had increasing SOB prior to admission. His symptoms are consistent with COPD exacerbation.  He responded well to treatments and at d/c  off oxygen, ambulating, feeling better.   2. History of Covid 37 - He was diagnosed on 05/09/19 and received monoclonal antibody therapy and felt much better until recently. It has been nearly 30 days since he was diagnosed and does not need Covid isolation.  3. COPD exacerbation - He is being treated with steroids, duonebs and doxycycline.  4. Atypical pneumonia- CXR and CT findings are likely sequela of post Covid 19 infection. He is on doxycycline to cover atypicals although his procalcitonin is low which argues against bacterial infection.  5. CLL with leukocytosis - wife reports he is followed by heme/onc and has WBC counts up to 140, he had a bump in WBC after steroids and we are following. Hematology consult requested.  6. Hypogammaglobulinemia - from underlying CLL per hem/onc noted. They did not recommend IVIG when he was seen on 05/25/19. I asked for inpatient hematology consult and he was seen by Dr. Delton Coombes and given 1 dose of IVIG. Pt tolerated it well.  Follow up with Hem/onc clinic in 2 weeks.  7. Hyperlipidemia - resumed on home meds.  8. Anxiety / depression - resumed home meds.  9. Generalized weakness - resolved now, he is ambulating independently in room.  DVT prophylaxis:lovenox Code Status:Full Family Communication:wife updated by telephone Disposition Plan:home  Consults: Dr. Delton Coombes Hematology/Oncology  Discharge Diagnoses:  Principal Problem:   Acute and chronic respiratory failure with hypoxia (HCC) Active Problems:   GERD (gastroesophageal reflux disease)   Hyperlipidemia   Chronic lymphocytic leukemia ([place])   Pneumonia due to COVID-19 virus   COPD exacerbation ([place])   History of COVID-19 dx  05-09-19     History of Present Illness  08/05/2019  Pulmonary/ 1st office eval/Alfred Brown re GOLD 0 copd  Chief Complaint  Patient presents with  . Consult    hx of covid and pna, hospitalized with pna.  sob worse since pna.  sob with exertion.   Dyspnea:  Maybe 100 ft ,  Now sob to mb flat and "pushes thru it but can really feel it fatigue and sob same time, not checking sats while walking   Cough: not a problem  Sleep: flat / one pillow SABA use: never prechallenges  No obvious day to day or daytime variability or assoc excess/ purulent sputum or mucus plugs or hemoptysis or cp or chest tightness, subjective wheeze or overt sinus or hb symptoms.   Sleeping as above  without nocturnal  or early am exacerbation  of respiratory  c/o's or need for noct saba. Also denies any obvious fluctuation of symptoms with weather or environmental changes or other aggravating or alleviating factors except as outlined above   No unusual exposure hx or h/o childhood pna/ asthma or knowledge of premature birth.  Current Allergies, Complete Past Medical History, Past Surgical History, Family History, and Social History were reviewed in [employer] record.  ROS  The following are not active complaints unless bolded Hoarseness, sore throat, dysphagia, dental problems, itching, sneezing,  nasal congestion or discharge of excess mucus or purulent secretions, ear ache,   fever, chills, sweats, unintended wt loss or wt gain, classically pleuritic or exertional cp,  orthopnea pnd or arm/hand swelling  or leg swelling, presyncope, palpitations, abdominal pain, anorexia, nausea, vomiting, diarrhea  or change in bowel habits or change in bladder habits, change in stools or change in urine, dysuria, hematuria,  rash, arthralgias, visual complaints, headache, numbness, weakness or ataxia or problems with walking or coordination,  change in mood or  memory.           Past Medical History:  Diagnosis  Date  . Aortic valve disorder    Mild insufficiency  . Chronic lymphocytic leukemia ([place])    01/2012: WBC-90.2, H&H-15.1/45.3, platelets-185 03/31/12: Verified with Dr. Tressie Stalker that no precautions or modification of medical regime are required prior to orthopaedic surgery.   . CLL (chronic lymphocytic leukemia) ([place])   . CMV (cytomegalovirus) ([place])   . COPD (chronic obstructive pulmonary disease) ([place])   . Depression with anxiety   . DJD (degenerative joint disease)   . GERD (gastroesophageal reflux disease)   . Hyperlipidemia   . Lipoma    left chest wall  . Palpitations 2004   PVCs; borderline stress nuclear in 2004, negative in 2007; Echo 2007; AV-sclerotic, very mild AI  . Pneumonia   . Right bundle branch block    + left posterior fascicular block  . Tobacco abuse    80 pack years    Outpatient Medications Prior to Visit  Medication Sig Dispense Refill  . albuterol (PROVENTIL HFA;VENTOLIN HFA) 108 (90 BASE) MCG/ACT inhaler Inhale 2 puffs into the lungs every 6 (six) hours as needed for wheezing or shortness of breath.     . ALPRAZolam (XANAX) 1 MG tablet Take 1 mg by mouth 3 (three) times  daily as needed for anxiety.     [name] aspirin 325 MG tablet Take 325 mg by mouth daily.      [name] buPROPion (WELLBUTRIN SR) 150 MG 12 hr tablet Take 150 mg by mouth daily.     . calcium carbonate (OS-CAL) 600 MG TABS Take 600 mg by mouth daily.      . celecoxib (CELEBREX) 100 MG capsule Take 100 mg by mouth 2 (two) times daily.    . clotrimazole-betamethasone (LOTRISONE) cream Apply 1 application topically daily as needed. Affected area    . fish oil-omega-3 fatty acids 1000 MG capsule Take 1 g by mouth daily.     . fluticasone (FLONASE) 50 MCG/ACT nasal spray Place 2 sprays into the nose daily.      [name] HYDROcodone-acetaminophen (NORCO/VICODIN) 5-325 MG per tablet Take 1 tablet by mouth every 6 (six) hours as needed for moderate pain.     . Loratadine 10 MG CAPS Take 10 mg by mouth daily.     [name]  lovastatin (MEVACOR) 20 MG tablet Take 20 mg by mouth daily.    . Multiple Vitamin (MULTIVITAMIN) tablet Take 1 tablet by mouth daily.      . niacin (NIASPAN) 500 MG CR tablet Take 500 mg by mouth daily.    . pentoxifylline (TRENTAL) 400 MG CR tablet Take 400 mg by mouth 3 (three) times daily.    . valACYclovir (VALTREX) 500 MG tablet Take 500 mg by mouth daily.    [name] omeprazole (PRILOSEC) 40 MG capsule Take 1 capsule (40 mg total) by mouth daily for 14 days. (Patient not taking: Reported on 08/05/2019) 14 capsule 0   No facility-administered medications prior to visit.     Objective:     BP 115/60 (BP Location: Left Arm, Cuff Size: Normal)   Pulse 76   Temp (!) 97.1 F (36.2 C) (Temporal)   Ht 6\' 1"  (1.854 m)   Wt 176 lb (79.8 kg)   SpO2 100%   BMI 23.22 kg/m   SpO2: 100 %    II/VI  Sem   HEENT : pt wearing mask not removed for exam due to covid - 19 concerns.   NECK :  without JVD/Nodes/TM/ nl carotid upstrokes bilaterally   LUNGS: no acc muscle use,  Min barrel  contour chest wall with bilateral  slightly decreased bs s audible wheeze and  without cough on insp or exp maneuvers and min  Hyperresonant  to  percussion bilaterally     CV:  RRR  no s3  With  II-III/VI  Sem but no increase in P2, and no edema   ABD:  soft and nontender with pos end  insp Hoover's  in the supine position. No bruits or organomegaly appreciated, bowel sounds nl  MS:   Nl gait/  ext warm without deformities, calf tenderness, cyanosis or clubbing No obvious joint restrictions   SKIN: warm and dry without lesions    NEURO:  alert, approp, nl sensorium with  no motor or cerebellar deficits apparent.          I personally reviewed images and agree with radiology impression as follows:  CXR:   PA and Lat 06/22/19 Multifocal pneumonia.    Assessment   COPD GOLD 0 Quit smoking march 2021 with covid 19 pna/resp failure resolved - PFT's  08/05/2019  FEV1 3.64 (96 % ) ratio 0.76  p 9 %  improvement from saba p nothing prior to study with DLCO  18.28 (63%) corrects to  2.88 (71%)  for alv volume and FV curve no significant curvature on f/v loop   -  08/05/2019   Walked RA   X 500 ft  @ slow pace  stopped due to end of study, sats 96% no sob or cp   Remarkable recovery clinically and by pfts which show low dlco but remarkable little evidence of copd or residual ALI s/p smoking cessation and covid 19 pna   Will need f/u cxr in 6 weeks and just use prn saba as no need for maint pulmonary meds of any kind at this point    >>> advised:  I reviewed the Fletcher curve with the patient that basically indicates  if you quit smoking when your best day FEV1 is still well preserved (as is clearly  the case here)  it is highly unlikely you will progress to severe disease and informed the patient there was  no medication on the market that has proven to alter the curve/ its downward trajectory  or the likelihood of progression of their disease(unlike other chronic medical conditions such as atheroclerosis where we do think we can change the natural hx with risk reducing meds)    Therefore   maintaining cigarette abstinence is  the most important aspects of his care, not choice of inhalers or for that matter, pulmonary doctors.   Treatment other than smoking cessation  is entirely directed by severity of symptoms and focused also on reducing exacerbations, not attempting to change the natural history of the disease.     Pneumonia due to COVID-19 virus F/u final cxr in 6weeks / advised cxr lag is the rule, not the exception   Chronic lymphocytic leukemia ([place]) Strongly advised keep up with vaccinations, esp covid and prevnar > discuss with hematologist    Aortic valve disorder F/u with cards planned, in meantime continue same pace exercise until approved for higher levels by cards    Each maintenance medication was reviewed in detail including emphasizing most importantly the difference  between maintenance and prns and under what circumstances the prns are to be triggered using an action plan format where appropriate.  Total time for H and P, chart review including extensive inpt records/images, counseling,  directly observing portions of ambulatory 02 saturation study/  and generating customized AVS unique to this office visit / charting = 68m        Christinia Gully, MD 08/05/2019

## 2019-08-05 NOTE — Assessment & Plan Note (Signed)
F/u final cxr in 6weeks / advised cxr lag is the rule, not the exception

## 2019-08-05 NOTE — Assessment & Plan Note (Addendum)
Quit smoking march 2021 with covid 19 pna/resp failure resolved - PFT's  08/05/2019  FEV1 3.64 (96 % ) ratio 0.76  p 9 % improvement from saba p nothing prior to study with DLCO  18.28 (63%) corrects to 2.88 (71%)  for alv volume and FV curve no significant curvature on f/v loop   -  08/05/2019   Walked RA   X 500 ft  @ slow pace  stopped due to end of study, sats 96% no sob or cp   Remarkable recovery clinically and by pfts which show low dlco but remarkable little evidence of copd or residual ALI s/p smoking cessation and covid 19 pna   Will need f/u cxr in 6 weeks and just use prn saba as no need for maint pulmonary meds of any kind at this point    >>> advised:  I reviewed the Fletcher curve with the patient that basically indicates  if you quit smoking when your best day FEV1 is still well preserved (as is clearly  the case here)  it is highly unlikely you will progress to severe disease and informed the patient there was  no medication on the market that has proven to alter the curve/ its downward trajectory  or the likelihood of progression of their disease(unlike other chronic medical conditions such as atheroclerosis where we do think we can change the natural hx with risk reducing meds)    Therefore   maintaining cigarette abstinence is  the most important aspects of his care, not choice of inhalers or for that matter, pulmonary doctors.   Treatment other than smoking cessation  is entirely directed by severity of symptoms and focused also on reducing exacerbations, not attempting to change the natural history of the disease.

## 2019-08-12 ENCOUNTER — Other Ambulatory Visit: Payer: Self-pay

## 2019-08-12 ENCOUNTER — Ambulatory Visit (INDEPENDENT_AMBULATORY_CARE_PROVIDER_SITE_OTHER): Payer: Medicare Other

## 2019-08-12 DIAGNOSIS — I350 Nonrheumatic aortic (valve) stenosis: Secondary | ICD-10-CM

## 2019-08-13 ENCOUNTER — Telehealth: Payer: Self-pay | Admitting: *Deleted

## 2019-08-13 NOTE — Telephone Encounter (Signed)
Pt aware and says he already has f/u with pulmonologist Dr Melvyn Novas in 3 weeks

## 2019-08-13 NOTE — Telephone Encounter (Signed)
-----   Message from Arnoldo Lenis, MD sent at 08/12/2019 12:53 PM EDT ----- Breathing tests show evidence of COPD, can we refer to [facility] [place] [facility] in [place]

## 2019-08-16 ENCOUNTER — Telehealth: Payer: Self-pay | Admitting: Cardiology

## 2019-08-16 NOTE — Telephone Encounter (Signed)
Patients BP & HR continue to go up and down. Patient is having dizzy spells. Requested results of recent echo  - [phone].

## 2019-08-16 NOTE — Telephone Encounter (Signed)
Pt wife says HR running from 60s-140s and BP 90s/60s and today c/o being lightheaded off an on all day - denies dizziness/CP/swelling/SOB - recent echo done - will forward to provider

## 2019-08-16 NOTE — Telephone Encounter (Signed)
Just sent result note to u   Zandra Abts MD

## 2019-08-17 NOTE — Telephone Encounter (Signed)
Echo looks good, heart valve remains mild to mod stiff. He has some fast heart rates during the test, with his home variable rates can we repeat a 7 day event monitor for him   J BrancH MD  Pt voiced understanding and agreeable to event monitor - enrolled

## 2019-08-20 ENCOUNTER — Encounter (INDEPENDENT_AMBULATORY_CARE_PROVIDER_SITE_OTHER): Payer: Medicare Other

## 2019-08-20 DIAGNOSIS — R002 Palpitations: Secondary | ICD-10-CM | POA: Diagnosis not present

## 2019-08-22 ENCOUNTER — Ambulatory Visit
Admission: EM | Admit: 2019-08-22 | Discharge: 2019-08-22 | Disposition: A | Discharge status: Home / Self Care | Payer: Medicare Other | Attending: Emergency Medicine | Admitting: Emergency Medicine

## 2019-08-22 DIAGNOSIS — L539 Erythematous condition, unspecified: Secondary | ICD-10-CM | POA: Diagnosis not present

## 2019-08-22 DIAGNOSIS — W57XXXA Bitten or stung by nonvenomous insect and other nonvenomous arthropods, initial encounter: Secondary | ICD-10-CM

## 2019-08-22 DIAGNOSIS — S90862A Insect bite (nonvenomous), left foot, initial encounter: Secondary | ICD-10-CM

## 2019-08-22 MED ORDER — CEPHALEXIN 500 MG PO CAPS
500.0000 mg | ORAL_CAPSULE | Freq: Four times a day (QID) | ORAL | 0 refills | Status: DC
Start: 2019-08-22 — End: 2019-08-22

## 2019-08-22 MED ORDER — CEPHALEXIN 500 MG PO CAPS
500.0000 mg | ORAL_CAPSULE | Freq: Four times a day (QID) | ORAL | 0 refills | Status: DC
Start: 2019-08-22 — End: 2019-09-23

## 2019-08-22 NOTE — Discharge Instructions (Addendum)
Keflex was prescribed/ take as directed Continue to use OTC hydrocortisone as needed Follow-up with PCP Return or go to ED for worsening of symptoms

## 2019-08-22 NOTE — ED Triage Notes (Signed)
Noticed area Thursday evening. Red bump on left foot.  Area itches.  Has used benadryl cream.  Doesn't remember getting bit by anything.

## 2019-08-22 NOTE — ED Provider Notes (Addendum)
[facility]    CSN: [id number] Arrival date & time: 08/22/19  1303      History   Chief Complaint Chief Complaint  Patient presents with  . Insect Bite    HPI Alfred Brown is a 69 y.o. male.   Went to the urgent care for complaint of red bumps on the left foot for the past 2 days.  She denies changes in soaps, detergents, or anyone with similar symptoms.  Patient is not sure if he was bite by an insect.  Described as red and itchy.  Has tried OTC medication with mild relief.  Nothing made symptoms worse.denies similar symptoms in the past.    The history is provided by the patient. No language interpreter was used.    Past Medical History:  Diagnosis Date  . Aortic valve disorder    Mild insufficiency  . Chronic lymphocytic leukemia ([place])    01/2012: WBC-90.2, H&H-15.1/45.3, platelets-185 03/31/12: Verified with Dr. Tressie Stalker that no precautions or modification of medical regime are required prior to orthopaedic surgery.   . CLL (chronic lymphocytic leukemia) ([place])   . CMV (cytomegalovirus) ([place])   . COPD (chronic obstructive pulmonary disease) ([place])   . Depression with anxiety   . DJD (degenerative joint disease)   . GERD (gastroesophageal reflux disease)   . Hyperlipidemia   . Lipoma    left chest wall  . Palpitations 2004   PVCs; borderline stress nuclear in 2004, negative in 2007; Echo 2007; AV-sclerotic, very mild AI  . Pneumonia   . Right bundle branch block    + left posterior fascicular block  . Tobacco abuse    80 pack years    Patient Active Problem List   Diagnosis Date Noted  . COPD GOLD 0 08/05/2019  . COPD exacerbation ([place]) 06/03/2019  . Acute and chronic respiratory failure with hypoxia ([place]) 06/03/2019  . History of COVID-19 dx 05-09-19 06/03/2019  . Pneumonia due to COVID-19 virus 06/02/2019  . GERD (gastroesophageal reflux disease)   . Hyperlipidemia   . Palpitations   . Chronic lymphocytic leukemia ([place])   . Aortic valve  disorder   . Right bundle branch block   . Tobacco abuse     Past Surgical History:  Procedure Laterality Date  . COLONOSCOPY  01/2012   Negative screening study  . GANGLION CYST EXCISION  1997   left wrist  . ROTATOR CUFF REPAIR  1997   right  . SHOULDER ARTHROSCOPY WITH SUBACROMIAL DECOMPRESSION  04/09/2012   Procedure: SHOULDER ARTHROSCOPY WITH SUBACROMIAL DECOMPRESSION;  Surgeon: Ninetta Lights, MD;  Location: [facility];  Service: Orthopedics;  Laterality: Left;  LEFT SHOULDER ARTHROSCOPY, SUBACROMIAL DECOMPRESSION, PARTIAL ACROMIOPLASTY WITH CORACOACROMIAL RELEASE, DISTAL CLAVICULECTOMY WITH ROTATOR CUFF REPAIR, DEBRIDEMENT OF LABRUM       Home Medications    Prior to Admission medications   Medication Sig Start Date End Date Taking? Authorizing Provider  albuterol (PROVENTIL HFA;VENTOLIN HFA) 108 (90 BASE) MCG/ACT inhaler Inhale 2 puffs into the lungs every 6 (six) hours as needed for wheezing or shortness of breath.     [provider]  ALPRAZolam Duanne Moron) 1 MG tablet Take 1 mg by mouth 3 (three) times daily as needed for anxiety.     [provider]  aspirin 325 MG tablet Take 325 mg by mouth daily.      [provider]  buPROPion (WELLBUTRIN SR) 150 MG 12 hr tablet Take 150 mg by mouth daily.  [provider]  calcium carbonate (OS-CAL) 600 MG TABS Take 600 mg by mouth daily.      [provider]  celecoxib (CELEBREX) 100 MG capsule Take 100 mg by mouth 2 (two) times daily.    [provider]  cephALEXin (KEFLEX) 500 MG capsule Take 1 capsule (500 mg total) by mouth 4 (four) times daily. 08/22/19   Avegno, Darrelyn Hillock, FNP  clotrimazole-betamethasone (LOTRISONE) cream Apply 1 application topically daily as needed. Affected area 06/19/15   [provider]  fish oil-omega-3 fatty acids 1000 MG capsule Take 1 g by mouth daily.     [provider]  fluticasone (FLONASE) 50 MCG/ACT nasal spray  Place 2 sprays into the nose daily.      [provider]  HYDROcodone-acetaminophen (NORCO/VICODIN) 5-325 MG per tablet Take 1 tablet by mouth every 6 (six) hours as needed for moderate pain.  08/10/14   [provider]  Loratadine 10 MG CAPS Take 10 mg by mouth daily.     [provider]  lovastatin (MEVACOR) 20 MG tablet Take 20 mg by mouth daily. 09/29/14   [provider]  Multiple Vitamin (MULTIVITAMIN) tablet Take 1 tablet by mouth daily.      [provider]  niacin (NIASPAN) 500 MG CR tablet Take 500 mg by mouth daily. 09/29/14   [provider]  omeprazole (PRILOSEC) 40 MG capsule Take 1 capsule (40 mg total) by mouth daily for 14 days. Patient not taking: Reported on 08/05/2019 06/04/19 07/11/20  Murlean Iba, MD  pentoxifylline (TRENTAL) 400 MG CR tablet Take 400 mg by mouth 3 (three) times daily. 05/27/19   [provider]  valACYclovir (VALTREX) 500 MG tablet Take 500 mg by mouth daily. 03/29/19   [provider]    Family History Family History  Problem Relation Age of Onset  . Stroke Mother   . Heart attack Father   . Cancer Sister        colon  . Colon cancer Sister   . Cancer Brother        2 brothers died with lung cancer    Social History Social History   Tobacco Use  . Smoking status: Former Smoker    Packs/day: 1.00    Years: 45.00    Pack years: 45.00    Types: Cigarettes    Quit date: 06/02/2019    Years since quitting: 0.2  . Smokeless tobacco: Never Used  . Tobacco comment: Consumption decreased approximately 2010 to one pack per day  Substance Use Topics  . Alcohol use: No    Alcohol/week: 0.0 standard drinks  . Drug use: No     Allergies   Patient has no known allergies.   Review of Systems Review of Systems  Constitutional: Negative.   Respiratory: Negative.   Cardiovascular: Negative.   Skin: Positive for color change.  All other systems reviewed and are negative.     Physical Exam Triage Vital Signs ED Triage Vitals [08/22/19 1315]  Enc Vitals Group     BP (!) 146/77     Pulse Rate 91     Resp 20     Temp 97.8 F (36.6 C)     Temp Source Oral     SpO2 97 %     Weight      Height      Head Circumference      Peak Flow      Pain Score  Pain Loc      Pain Edu?      Excl. in [place]?    No data found.  Updated Vital Signs BP (!) 146/77 (BP Location: Right Arm)   Pulse 91   Temp 97.8 F (36.6 C) (Oral)   Resp 20   SpO2 97%   Visual Acuity Right Eye Distance:   Left Eye Distance:   Bilateral Distance:    Right Eye Near:   Left Eye Near:    Bilateral Near:     Physical Exam Vitals and nursing note reviewed.  Constitutional:      General: He is not in acute distress.    Appearance: Normal appearance. He is normal weight. He is not ill-appearing, toxic-appearing or diaphoretic.  Cardiovascular:     Rate and Rhythm: Normal rate and regular rhythm.     Pulses: Normal pulses.     Heart sounds: Normal heart sounds. No murmur. No friction rub. No gallop.   Pulmonary:     Effort: Pulmonary effort is normal. No respiratory distress.     Breath sounds: Normal breath sounds. No stridor. No wheezing, rhonchi or rales.  Chest:     Chest wall: No tenderness.  Skin:    General: Skin is warm.     Capillary Refill: Capillary refill takes less than 2 seconds.     Findings: Erythema present. Rash is not crusting, macular or scaling.     Comments: Erythematous area on top of left foot with a right papule  Neurological:     Mental Status: He is alert.      UC Treatments / Results  Labs (all labs ordered are listed, but only abnormal results are displayed) Labs Reviewed - No data to display  EKG   Radiology No results found.  Procedures Procedures (including critical care time)  Medications Ordered in UC Medications - No data to display  Initial Impression / Assessment and Plan / UC Course  I have reviewed the triage vital  signs and the nursing notes.  Pertinent labs & imaging results that were available during my care of the patient were reviewed by me and considered in my medical decision making (see chart for details).   Patient stable at discharge.  We will treat him prophylactically with Keflex.  Was advised to continue to apply hydrocortisone cream as needed  Final Clinical Impressions(s) / UC Diagnoses   Final diagnoses:  Insect bite of left foot, initial encounter  Erythema of skin     Discharge Instructions     Keflex was prescribed/ take as directed Continue to use OTC hydrocortisone as needed Follow-up with PCP Return or go to ED for worsening of symptoms    ED Prescriptions    Medication Sig Dispense Auth. Provider   cephALEXin (KEFLEX) 500 MG capsule Take 1 capsule (500 mg total) by mouth 4 (four) times daily. 20 capsule Avegno, Darrelyn Hillock, FNP     PDMP not reviewed this encounter.   Emerson Monte, FNP 08/22/19 1327    Emerson Monte, FNP 08/22/19 1328

## 2019-08-23 ENCOUNTER — Other Ambulatory Visit: Payer: Self-pay | Admitting: *Deleted

## 2019-08-23 DIAGNOSIS — R002 Palpitations: Secondary | ICD-10-CM

## 2019-08-26 DIAGNOSIS — E7849 Other hyperlipidemia: Secondary | ICD-10-CM | POA: Diagnosis not present

## 2019-08-26 DIAGNOSIS — I10 Essential (primary) hypertension: Secondary | ICD-10-CM | POA: Diagnosis not present

## 2019-08-26 DIAGNOSIS — R58 Hemorrhage, not elsewhere classified: Secondary | ICD-10-CM | POA: Diagnosis not present

## 2019-08-26 DIAGNOSIS — C9190 Lymphoid leukemia, unspecified not having achieved remission: Secondary | ICD-10-CM | POA: Diagnosis not present

## 2019-08-26 DIAGNOSIS — Z6824 Body mass index (BMI) 24.0-24.9, adult: Secondary | ICD-10-CM | POA: Diagnosis not present

## 2019-08-26 DIAGNOSIS — N486 Induration penis plastica: Secondary | ICD-10-CM | POA: Diagnosis not present

## 2019-08-30 DIAGNOSIS — Z720 Tobacco use: Secondary | ICD-10-CM | POA: Diagnosis not present

## 2019-08-30 DIAGNOSIS — M1991 Primary osteoarthritis, unspecified site: Secondary | ICD-10-CM | POA: Diagnosis not present

## 2019-08-30 DIAGNOSIS — J449 Chronic obstructive pulmonary disease, unspecified: Secondary | ICD-10-CM | POA: Diagnosis not present

## 2019-08-30 DIAGNOSIS — C9190 Lymphoid leukemia, unspecified not having achieved remission: Secondary | ICD-10-CM | POA: Diagnosis not present

## 2019-09-14 ENCOUNTER — Telehealth: Payer: Self-pay | Admitting: Cardiology

## 2019-09-14 ENCOUNTER — Telehealth: Payer: Self-pay | Admitting: *Deleted

## 2019-09-14 ENCOUNTER — Ambulatory Visit (INDEPENDENT_AMBULATORY_CARE_PROVIDER_SITE_OTHER): Payer: Medicare Other | Admitting: *Deleted

## 2019-09-14 DIAGNOSIS — R002 Palpitations: Secondary | ICD-10-CM

## 2019-09-14 MED ORDER — DILTIAZEM HCL 30 MG PO TABS
30.0000 mg | ORAL_TABLET | Freq: Two times a day (BID) | ORAL | 3 refills | Status: DC
Start: 2019-09-14 — End: 2019-10-01

## 2019-09-14 NOTE — Progress Notes (Addendum)
BP 138/64 HR 93 02 96% weight 175lbs - c/o palpitations/weakness/some SOB starting yesterday evening - says it feels like integestion - could not mow grass or do normal activities with these symptoms - EKG done and will forward to Dr Tillie Fantasia Dr Harl Bowie: EKGs show short episodes of a heart rhythm called SVT. its not dangerous but can cause HR's to suddenly increase. Can he start diltiazem 30mg  bid, needs f/u 2 weeks with Jonni Sanger  Pt voiced understanding - dilt sent to pharmacy and 2 week f/u scheduled

## 2019-09-14 NOTE — Telephone Encounter (Signed)
Pt will come in at 1pm

## 2019-09-14 NOTE — Telephone Encounter (Signed)
Pt c/o weakness/SOB starting this morning - says HR has been up in the 140s lasting a few minutes then coming back down to 80s - denies chest pain/dizziness

## 2019-09-14 NOTE — Telephone Encounter (Signed)
Please give pt a call concerning medication changes from today  [phone]

## 2019-09-14 NOTE — Telephone Encounter (Signed)
Yes EKG and vitals please   Alfred Ruthmary Occhipinti MD

## 2019-09-14 NOTE — Telephone Encounter (Signed)
Pt wanting to make sure he can take dilt 30 mg bid with other medication - aware that he can

## 2019-09-14 NOTE — Patient Instructions (Signed)
Your physician recommends that you schedule a follow-up appointment in: [place], NP  Your physician has recommended you make the following change in your medication:   START DILTIAZEM 30 MG TWICE DAILY   Thank you for choosing [facility]!!

## 2019-09-16 ENCOUNTER — Other Ambulatory Visit: Payer: Self-pay

## 2019-09-16 ENCOUNTER — Ambulatory Visit (HOSPITAL_COMMUNITY)
Admission: RE | Admit: 2019-09-16 | Discharge: 2019-09-16 | Disposition: A | Discharge status: Home / Self Care | Payer: Medicare Other | Source: Ambulatory Visit | Attending: Internal Medicine | Admitting: Internal Medicine

## 2019-09-16 ENCOUNTER — Inpatient Hospital Stay (HOSPITAL_COMMUNITY): Payer: Medicare Other | Attending: Hematology

## 2019-09-16 DIAGNOSIS — I700 Atherosclerosis of aorta: Secondary | ICD-10-CM | POA: Diagnosis not present

## 2019-09-16 DIAGNOSIS — J1282 Pneumonia due to coronavirus disease 2019: Secondary | ICD-10-CM

## 2019-09-16 DIAGNOSIS — D801 Nonfamilial hypogammaglobulinemia: Secondary | ICD-10-CM | POA: Diagnosis not present

## 2019-09-16 DIAGNOSIS — U071 COVID-19: Secondary | ICD-10-CM | POA: Insufficient documentation

## 2019-09-16 DIAGNOSIS — C9110 Chronic lymphocytic leukemia of B-cell type not having achieved remission: Secondary | ICD-10-CM | POA: Diagnosis present

## 2019-09-16 DIAGNOSIS — Z8616 Personal history of COVID-19: Secondary | ICD-10-CM | POA: Diagnosis not present

## 2019-09-16 DIAGNOSIS — R599 Enlarged lymph nodes, unspecified: Secondary | ICD-10-CM | POA: Diagnosis not present

## 2019-09-16 LAB — CBC WITH DIFFERENTIAL/PLATELET
Abs Immature Granulocytes: 0.15 10*3/uL — ABNORMAL HIGH (ref 0.00–0.07)
Basophils Absolute: 0.1 10*3/uL (ref 0.0–0.1)
Basophils Relative: 0 %
Eosinophils Absolute: 0.3 10*3/uL (ref 0.0–0.5)
Eosinophils Relative: 0 %
HCT: 45.6 % (ref 39.0–52.0)
Hemoglobin: 13.9 g/dL (ref 13.0–17.0)
Immature Granulocytes: 0 %
Lymphocytes Relative: 92 %
Lymphs Abs: 85 10*3/uL — ABNORMAL HIGH (ref 0.7–4.0)
MCH: 28.6 pg (ref 26.0–34.0)
MCHC: 30.5 g/dL (ref 30.0–36.0)
MCV: 93.8 fL (ref 80.0–100.0)
Monocytes Absolute: 2.8 10*3/uL — ABNORMAL HIGH (ref 0.1–1.0)
Monocytes Relative: 3 %
Neutro Abs: 4.5 10*3/uL (ref 1.7–7.7)
Neutrophils Relative %: 5 %
Platelets: 130 10*3/uL — ABNORMAL LOW (ref 150–400)
RBC: 4.86 MIL/uL (ref 4.22–5.81)
RDW: 14.4 % (ref 11.5–15.5)
WBC: 92.8 10*3/uL (ref 4.0–10.5)
nRBC: 0 % (ref 0.0–0.2)

## 2019-09-16 LAB — VITAMIN B12: Vitamin B-12: 402 pg/mL (ref 180–914)

## 2019-09-16 LAB — COMPREHENSIVE METABOLIC PANEL
ALT: 16 U/L (ref 0–44)
AST: 21 U/L (ref 15–41)
Albumin: 4.3 g/dL (ref 3.5–5.0)
Alkaline Phosphatase: 123 U/L (ref 38–126)
Anion gap: 6 (ref 5–15)
BUN: 20 mg/dL (ref 8–23)
CO2: 28 mmol/L (ref 22–32)
Calcium: 9.1 mg/dL (ref 8.9–10.3)
Chloride: 106 mmol/L (ref 98–111)
Creatinine, Ser: 0.92 mg/dL (ref 0.61–1.24)
GFR calc Af Amer: >60 mL/min (ref >60)
GFR calc non Af Amer: >60 mL/min (ref >60)
Glucose, Bld: 102 mg/dL — ABNORMAL HIGH (ref 70–99)
Potassium: 4.7 mmol/L (ref 3.5–5.1)
Sodium: 140 mmol/L (ref 135–145)
Total Bilirubin: 0.5 mg/dL (ref 0.3–1.2)
Total Protein: 6.5 g/dL (ref 6.5–8.1)

## 2019-09-16 LAB — VITAMIN D 25 HYDROXY (VIT D DEFICIENCY, FRACTURES): Vit D, 25-Hydroxy: 46.76 ng/mL (ref 30–100)

## 2019-09-16 LAB — LACTATE DEHYDROGENASE: LDH: 139 U/L (ref 98–192)

## 2019-09-16 NOTE — Progress Notes (Signed)
CRITICAL VALUE ALERT  Critical Value:  WBC 92.8  Date & Time Notied:  09/16/2019 at 1212  Provider Notified: Cristela Felt, NP  Orders Received/Actions taken: n/a

## 2019-09-17 ENCOUNTER — Encounter: Payer: Self-pay | Admitting: Internal Medicine

## 2019-09-17 ENCOUNTER — Ambulatory Visit (INDEPENDENT_AMBULATORY_CARE_PROVIDER_SITE_OTHER): Payer: Medicare Other | Admitting: Internal Medicine

## 2019-09-17 ENCOUNTER — Other Ambulatory Visit: Payer: Self-pay

## 2019-09-17 DIAGNOSIS — J1282 Pneumonia due to coronavirus disease 2019: Secondary | ICD-10-CM

## 2019-09-17 DIAGNOSIS — U071 COVID-19: Secondary | ICD-10-CM

## 2019-09-17 DIAGNOSIS — J449 Chronic obstructive pulmonary disease, unspecified: Secondary | ICD-10-CM | POA: Diagnosis not present

## 2019-09-17 LAB — IGG, IGA, IGM
IgA: 38 mg/dL — ABNORMAL LOW (ref 61–437)
IgG (Immunoglobin G), Serum: 544 mg/dL — ABNORMAL LOW (ref 603–1613)
IgM (Immunoglobulin M), Srm: 28 mg/dL (ref 20–172)

## 2019-09-17 NOTE — Patient Instructions (Signed)
Make sure you check your oxygen saturations about once a week at highest level of activity to be sure it stays over 90% and  Call me if you are losing ground   The key is to stop smoking completely before smoking completely stops you!  I strongly recommend you take the moderna or the pfizer shot    If you are satisfied with your treatment plan,  let your doctor know and he/she can either refill your medications or you can return here when your prescription runs out.     If in any way you are not 100% satisfied,  please tell us.  If 100% better, tell your friends!  Pulmonary follow up is as needed

## 2019-09-17 NOTE — Assessment & Plan Note (Signed)
Reviewed cxr's serially which show significant serial resolution and note that sats and ex tol have continued to improve since last ov   rec Make sure you check your oxygen saturations once a week  at highest level of activity to be sure it stays over 90% and call if losing ground  Strongly rec reconsider at least one if not both covid 19 vaccinations p discussion of benefit relative to risk in his situation but wants to talk it over with hematology first which is understanable.  >>>>  Pulmonary f/u is prn           Each maintenance medication was reviewed in detail including emphasizing most importantly the difference between maintenance and prns and under what circumstances the prns are to be triggered using an action plan format where appropriate.  Total time for H and P, chart review, counseling, teaching device and generating customized AVS unique to this summary final office visit / charting = 30 min

## 2019-09-17 NOTE — Progress Notes (Signed)
Alfred Brown, male    DOB: July 26, 1950,     MRN: [id number]   Brief patient profile:  34 yowm quit smoking June 02 2019  on 2006  On disability for CLL with dx of Brown at  [facility] around 2010 and once or twice weekly needing albuterol and seemed to help cough then covid dx 05/09/19 rx antibody did seem to help  then   Admit date: 06/02/2019 Discharge date: 06/04/2019  Admitted From:  Home  Disposition:  Home   Recommendations for Outpatient Follow-up:  1. Follow up with PCP in 1 weeks 2. Please obtain BMP/CBC in one week 3. Please follow up on the following pending results:  Discharge Condition: STABLE   CODE STATUS: FULL    Brief Hospitalization Summary: Please see all hospital notes, images, labs for full details of the hospitalization.  ADMISSION HPI:  JimmyDurhamis a68 y.o.male,with history of CLL, GERD, hyperlipidemia, Brown who was diagnosed with COVID-19 infection 3 weeks ago, and received outpatient IV monoclonal antibody at [facility]. Patient today went to his primary care physician for fever and shortness of breath. Chest x-ray was consistent with pneumonia. Patient was sent to ED for further evaluation. In the ED CT chest showed multifocal pneumonia, worrisome for viral pneumonia. Patient has history of CLL, WBC count is 86,000. He is followed by hematology/oncology at [facility] as outpatient. He denies nausea vomiting or diarrhea. Patient said that he started having fever last week Also complains of shortness of breath. In the ED patient was put on 2 L/min of oxygen.  He denies abdominal pain, dysuria   Brief Admission Hx: 68 y.o.male,with history of CLL, GERD, hyperlipidemia, Brown who was diagnosed with COVID-19 infectiondiagnosed 2/7/21and received outpatient IV monoclonal antibody at [facility] done well after treatments. In last couple of days presents with increasing SOB, fever, found to have pneumonia on CXR and new oxygen requirement.   MDM/Assessment &  Plan:  1. Acute   respiratory failure with hypoxia - Pt presented with a new oxygen requirement. He has had increasing SOB prior to admission. His symptoms are consistent with Brown exacerbation.  He responded well to treatments and at d/c  off oxygen, ambulating, feeling better.   2. History of Covid 34 - He was diagnosed on 05/09/19 and received monoclonal antibody therapy and felt much better until recently. It has been nearly 30 days since he was diagnosed and does not need Covid isolation.  3. Brown exacerbation - He is being treated with steroids, duonebs and doxycycline.  4. Atypical pneumonia- CXR and CT findings are likely sequela of post Covid 19 infection. He is on doxycycline to cover atypicals although his procalcitonin is low which argues against bacterial infection.  5. CLL with leukocytosis - wife reports he is followed by heme/onc and has WBC counts up to 140, he had a bump in WBC after steroids and we are following. Hematology consult requested.  6. Hypogammaglobulinemia - from underlying CLL per hem/onc noted. They did not recommend IVIG when he was seen on 05/25/19. I asked for inpatient hematology consult and he was seen by Dr. Delton Brown and given 1 dose of IVIG. Pt tolerated it well.  Follow up with Hem/onc clinic in 2 weeks.  7. Hyperlipidemia - resumed on home meds.  8. Anxiety / depression - resumed home meds.  9. Generalized weakness - resolved now, he is ambulating independently in room.  DVT prophylaxis:lovenox Code Status:Full Family Communication:wife updated by telephone Disposition Plan:home  Consults: Dr. Delton Brown Hematology/Oncology  Discharge Diagnoses:  Principal Problem:   Acute and chronic respiratory failure with hypoxia (HCC) Active Problems:   GERD (gastroesophageal reflux disease)   Hyperlipidemia   Chronic lymphocytic leukemia ([place])   Pneumonia due to COVID-19 virus   Brown exacerbation ([place])   History of COVID-19 dx  05-09-19     History of Present Illness  08/05/2019  Pulmonary/ 1st office eval/Alfred Brown  Chief Complaint  Patient presents with  . Consult    hx of covid and pna, hospitalized with pna.  sob worse since pna.  sob with exertion.   Dyspnea:  Maybe 100 ft ,  Now sob to mb flat and "pushes thru it but can really feel it fatigue and sob same time, not checking sats while walking   Cough: not a problem  Sleep: flat / one pillow SABA use: never prechallenges rec Don't do any more exertion than you have been at home until you have until you are cleared by your heart doctor - then follow his guidelines for more exercise but you don't have significant Brown so you should do fine as you continue to recover from COVID 19 pneumonia. Try albuterol 15 min before an activity that you know would make you short of breath and see if it makes any difference -  if makes none then don't take it after activity unless you can't catch your breath. Work on inhaler technique:    Make sure you check your oxygen saturations at highest level of activity to be sure it stays over 90%  Let me know if the the trend is down over time as it should gradually improve  Strongly recommend the prevar 13 and the covid vaccination  - discuss with your hematologist    09/17/2019  f/u ov/Alfred Brown re: GOLD 0 Brown/ AS  Post covid pna ild/ not vaccinated yet  Chief Complaint  Patient presents with  . Follow-up    Breathing is about the same, maybe some better since the last visit. He has occ cough with white sputum. He rarely uses his albuterol inhaler.    Dyspnea:  mb and back getting easier with or without inhaler sats upper 90s  Cough: first thing in am, white x two coughs  Sleeping: ok flat  SABA use: none  - does not help ex performance if use prior  02: none    No obvious day to day or daytime variability or assoc excess/ purulent sputum or mucus plugs or hemoptysis or cp or chest tightness, subjective wheeze or overt  sinus or hb symptoms.   Smoke without nocturnal  or early am exacerbation  of respiratory  c/o's or need for noct saba. Also denies any obvious fluctuation of symptoms with weather or environmental changes or other aggravating or alleviating factors except as outlined above   No unusual exposure hx or h/o childhood pna/ asthma or knowledge of premature birth.  Current Allergies, Complete Past Medical History, Past Surgical History, Family History, and Social History were reviewed in [employer] record.  ROS  The following are not active complaints unless bolded Hoarseness, sore throat, dysphagia, dental problems, itching, sneezing,  nasal congestion or discharge of excess mucus or purulent secretions, ear ache,   fever, chills, sweats, unintended wt loss or wt gain, classically pleuritic or exertional cp,  orthopnea pnd or arm/hand swelling  or leg swelling, presyncope, palpitations, abdominal pain, anorexia, nausea, vomiting, diarrhea  or change in bowel habits or change in bladder habits, change  in stools or change in urine, dysuria, hematuria,  rash, arthralgias, visual complaints, headache, numbness, weakness or ataxia or problems with walking or coordination,  change in mood or  memory.        Current Meds  Medication Sig  . albuterol (PROVENTIL HFA;VENTOLIN HFA) 108 (90 BASE) MCG/ACT inhaler Inhale 2 puffs into the lungs every 6 (six) hours as needed for wheezing or shortness of breath.   . ALPRAZolam (XANAX) 1 MG tablet Take 1 mg by mouth 3 (three) times daily as needed for anxiety.   [name] aspirin 325 MG tablet Take 325 mg by mouth daily.    [name] buPROPion (WELLBUTRIN SR) 150 MG 12 hr tablet Take 150 mg by mouth daily.   . calcium carbonate (OS-CAL) 600 MG TABS Take 600 mg by mouth daily.    . celecoxib (CELEBREX) 100 MG capsule Take 100 mg by mouth 2 (two) times daily.  . cephALEXin (KEFLEX) 500 MG capsule Take 1 capsule (500 mg total) by mouth 4 (four) times daily.  .  clotrimazole-betamethasone (LOTRISONE) cream Apply 1 application topically daily as needed. Affected area  . diltiazem (CARDIZEM) 30 MG tablet Take 1 tablet (30 mg total) by mouth 2 (two) times daily.  . fish oil-omega-3 fatty acids 1000 MG capsule Take 1 g by mouth daily.   . fluticasone (FLONASE) 50 MCG/ACT nasal spray Place 2 sprays into the nose daily.    [name] HYDROcodone-acetaminophen (NORCO/VICODIN) 5-325 MG per tablet Take 1 tablet by mouth every 6 (six) hours as needed for moderate pain.   . Loratadine 10 MG CAPS Take 10 mg by mouth daily.   [name] lovastatin (MEVACOR) 20 MG tablet Take 20 mg by mouth daily.  . Multiple Vitamin (MULTIVITAMIN) tablet Take 1 tablet by mouth daily.    . niacin (NIASPAN) 500 MG CR tablet Take 500 mg by mouth daily.  . pentoxifylline (TRENTAL) 400 MG CR tablet Take 400 mg by mouth 3 (three) times daily.  . valACYclovir (VALTREX) 500 MG tablet Take 500 mg by mouth daily.                Past Medical History:  Diagnosis Date  . Aortic valve disorder    Mild insufficiency  . Chronic lymphocytic leukemia ([place])    01/2012: WBC-90.2, H&H-15.1/45.3, platelets-185 03/31/12: Verified with Dr. Tressie Stalker that no precautions or modification of medical regime are required prior to orthopaedic surgery.   . CLL (chronic lymphocytic leukemia) ([place])   . CMV (cytomegalovirus) ([place])   . Brown (chronic obstructive pulmonary disease) ([place])   . Depression with anxiety   . DJD (degenerative joint disease)   . GERD (gastroesophageal reflux disease)   . Hyperlipidemia   . Lipoma    left chest wall  . Palpitations 2004   PVCs; borderline stress nuclear in 2004, negative in 2007; Echo 2007; AV-sclerotic, very mild AI  . Pneumonia   . Right bundle branch block    + left posterior fascicular block  . Tobacco abuse    80 pack years       Objective:      Wt Readings from Last 3 Encounters:  09/17/19 176 lb (79.8 kg)  08/05/19 176 lb (79.8 kg)  07/12/19 172 lb 6.4 oz  (78.2 kg)     Vital signs reviewed - Note on arrival 02 sats  99% on RA      HEENT : pt wearing mask not removed for exam due to covid - 19 concerns.   NECK :  without JVD/Nodes/TM/ nl carotid upstrokes bilaterally   LUNGS: no acc muscle use,  Min barrel  contour chest wall with bilateral  slightly decreased bs s audible wheeze and  without cough on insp or exp maneuvers and min  Hyperresonant  to  percussion bilaterally     CV:  RRR  no s3 II/VI sem  or increase in P2, and no edema   ABD:  soft and nontender with pos end  insp Hoover's  in the supine position. No bruits or organomegaly appreciated, bowel sounds nl  MS:   Nl gait/  ext warm without deformities, calf tenderness, cyanosis or clubbing No obvious joint restrictions   SKIN: warm and dry without lesions    NEURO:  alert, approp, nl sensorium with  no motor or cerebellar deficits apparent.           I personally reviewed images and agree with radiology impression as follows:  CXR:  09/16/19  Resolving changes of COVID-19 infection with residual areas of interstitial prominence.  My review: marked serial improvement      Assessment

## 2019-09-17 NOTE — Assessment & Plan Note (Signed)
Quit smoking march 2021 with covid 19 pna/resp failure resolved - PFT's  08/03/2019  FEV1 3.64 (96 % ) ratio 0.76  p 9 % improvement from saba p nothing prior to study with DLCO  18.28 (63%) corrects to 2.88 (71%)  for alv volume and FV curve no significant curvature on f/v loop   -  08/05/2019   Walked RA   X 500 ft  @ slow pace  stopped due to end of study, sats 96% no sob or cp   Progressive improvement ex to s need for any resp rx at all likely because he stopped smoking with still very good lung function.  Reviewed with pt:  When respiratory symptoms begin or become refractory well after a patient reports complete smoking cessation,  Especially when this wasn't the case while they were smoking, a red flag is raised based on the work of Dr Kris Mouton which states:  if you quit smoking when your best day FEV1 is still well preserved it is highly unlikely you will progress to severe disease.  That is to say, once the smoking stops,  the symptoms should not suddenly erupt or markedly worsen.  If so, the differential diagnosis should include  obesity/deconditioning,  LPR/Reflux/Aspiration syndromes,  occult CHF, or  especially side effect of medications commonly used in this population.

## 2019-09-23 ENCOUNTER — Other Ambulatory Visit: Payer: Self-pay

## 2019-09-23 ENCOUNTER — Encounter (HOSPITAL_COMMUNITY): Payer: Self-pay | Admitting: Oncology

## 2019-09-23 ENCOUNTER — Inpatient Hospital Stay (HOSPITAL_BASED_OUTPATIENT_CLINIC_OR_DEPARTMENT_OTHER): Payer: Medicare Other | Admitting: Oncology

## 2019-09-23 VITALS — BP 123/63 | HR 88 | Temp 97.7°F | Resp 18 | Wt 178.8 lb

## 2019-09-23 DIAGNOSIS — C9110 Chronic lymphocytic leukemia of B-cell type not having achieved remission: Secondary | ICD-10-CM

## 2019-09-23 DIAGNOSIS — Z8616 Personal history of COVID-19: Secondary | ICD-10-CM | POA: Diagnosis not present

## 2019-09-23 DIAGNOSIS — D801 Nonfamilial hypogammaglobulinemia: Secondary | ICD-10-CM | POA: Diagnosis not present

## 2019-09-23 DIAGNOSIS — J449 Chronic obstructive pulmonary disease, unspecified: Secondary | ICD-10-CM

## 2019-09-23 DIAGNOSIS — Z720 Tobacco use: Secondary | ICD-10-CM

## 2019-09-23 HISTORY — DX: Nonfamilial hypogammaglobulinemia: D80.1

## 2019-09-23 NOTE — Patient Instructions (Signed)
[place] at [facility] Discharge Instructions  You were seen today by Kirby Crigler PA. He went over your recent lab results. He will see you back in 2 months for labs and follow up.   Thank you for choosing [place] at [facility] to provide your oncology and hematology care.  To afford each patient quality time with our provider, please arrive at least 15 minutes before your scheduled appointment time.   If you have a lab appointment with the [facility] please come in thru the  Main Entrance and check in at the main information desk  You need to re-schedule your appointment should you arrive 10 or more minutes late.  We strive to give you quality time with our providers, and arriving late affects you and other patients whose appointments are after yours.  Also, if you no show three or more times for appointments you may be dismissed from the clinic at the providers discretion.     Again, thank you for choosing [facility].  Our hope is that these requests will decrease the amount of time that you wait before being seen by our physicians.       _____________________________________________________________  Should you have questions after your visit to [facility], please contact our office at [phone] between the hours of 8:00 a.m. and 4:30 p.m.  Voicemails left after 4:00 p.m. will not be returned until the following business day.  For prescription refill requests, have your pharmacy contact our office and allow 72 hours.    Cancer Center Support Programs:   > Cancer Support Group  2nd Tuesday of the month 1pm-2pm, Journey Room

## 2019-09-23 NOTE — Progress Notes (Signed)
[place]mond School, MD [address] [place] [address]  Chronic lymphocytic leukemia [facility]) - Plan: CBC with Differential, Comprehensive metabolic panel, Lactate dehydrogenase, IgG, IgA, IgM, Sedimentation rate  Hypogammaglobulinemia ([place]) - Plan: CBC with Differential, Comprehensive metabolic panel, Lactate dehydrogenase, IgG, IgA, IgM, Sedimentation rate  History of COVID-19 dx 05-09-19  Tobacco abuse  COPD GOLD 0   HISTORY OF PRESENT ILLNESS: Stage II CLL by Rai system: -Diagnosed around 2010, observation since then. -Denies any fevers, night sweats or weight loss in the last 6 months. -Patient was diagnosed with COVID-19 on 03/07/2020.  He was also admitted in March for IVIG infusion. -Physical examination shows mild adenopathy in the neck region, posterior triangle which are stable.  CURRENT STATUS: Alfred Brown 69 y.o. male returns for followup of CLL currently undergoing surveillance/observation.  He denies any new lumps or bumps on his own examination.  He does have mild adenopathy in the neck region without significant enlargement or change.  No new lymph nodes palpable on examination.  He reports resolution of night sweats.  No new B symptoms including fevers, chills, night sweats, early satiety, and unintentional weight loss.  No new pain.  No cough or hemoptysis.  He denies any abdominal bloating or discomfort.  He asked about the role of COVID-19 immunization in the setting of having contracted COVID-19 and recovered.  We discussed this in detail.  Review of Systems  Constitutional: Negative.  Negative for chills, fever and weight loss.  HENT: Negative.   Eyes: Negative.   Respiratory: Negative.  Negative for cough.   Cardiovascular: Negative.  Negative for chest pain.  Gastrointestinal: Negative.  Negative for blood in stool, constipation, diarrhea, melena, nausea and vomiting.  Genitourinary: Negative.   Musculoskeletal: Negative.   Skin:  Negative.   Neurological: Negative.  Negative for weakness.  Endo/Heme/Allergies: Negative.   Psychiatric/Behavioral: Negative.     Past Medical History:  Diagnosis Date  . Aortic valve disorder    Mild insufficiency  . Chronic lymphocytic leukemia ([place])    01/2012: WBC-90.2, H&H-15.1/45.3, platelets-185 03/31/12: Verified with Dr. Tressie Stalker that no precautions or modification of medical regime are required prior to orthopaedic surgery.   . CLL (chronic lymphocytic leukemia) ([place])   . CMV (cytomegalovirus) ([place])   . COPD (chronic obstructive pulmonary disease) ([place])   . Depression with anxiety   . DJD (degenerative joint disease)   . GERD (gastroesophageal reflux disease)   . Hyperlipidemia   . Hypogammaglobulinemia ([place]) 09/23/2019  . Lipoma    left chest wall  . Palpitations 2004   PVCs; borderline stress nuclear in 2004, negative in 2007; Echo 2007; AV-sclerotic, very mild AI  . Pneumonia   . Right bundle branch block    + left posterior fascicular block  . Tobacco abuse    80 pack years     PHYSICAL EXAMINATION  ECOG PERFORMANCE STATUS: 0 - Asymptomatic  Vitals:   09/23/19 1124  BP: 123/63  Pulse: 88  Resp: 18  Temp: 97.7 F (36.5 C)  SpO2: 99%    GENERAL:alert, no distress, well nourished, well developed, comfortable, cooperative, smiling and accompanied by wife SKIN: skin color, texture, turgor are normal, no rashes or significant lesions HEAD: Normocephalic, No masses, lesions, tenderness or abnormalities EYES: normal, Conjunctiva are pink and non-injected EARS: External ears normal OROPHARYNX: Not examined, mask in place NECK: supple, moderate anterior cervical adenopathy bilaterally LYMPH: Bilateral supraclavicular lymphadenopathy noted, mild. BREAST:not examined LUNGS: clear to auscultation  HEART:  regular rate & rhythm, no murmurs and no gallops ABDOMEN:abdomen soft, non-tender, normal bowel sounds and no masses or organomegaly BACK: Back symmetric,  no curvature. EXTREMITIES:less then 2 second capillary refill, no joint deformities, effusion, or inflammation, no edema, no skin discoloration, no cyanosis  NEURO: alert & oriented x 3 with fluent speech, no focal motor/sensory deficits, gait normal   LABORATORY DATA: CBC    Component Value Date/Time   WBC 92.8 (HH) 09/16/2019 1107   RBC 4.86 09/16/2019 1107   HGB 13.9 09/16/2019 1107   HCT 45.6 09/16/2019 1107   PLT 130 (L) 09/16/2019 1107   MCV 93.8 09/16/2019 1107   MCH 28.6 09/16/2019 1107   MCHC 30.5 09/16/2019 1107   RDW 14.4 09/16/2019 1107   LYMPHSABS 85.0 (H) 09/16/2019 1107   MONOABS 2.8 (H) 09/16/2019 1107   EOSABS 0.3 09/16/2019 1107   BASOSABS 0.1 09/16/2019 1107      Chemistry      Component Value Date/Time   NA 140 09/16/2019 1107   K 4.7 09/16/2019 1107   CL 106 09/16/2019 1107   CO2 28 09/16/2019 1107   BUN 20 09/16/2019 1107   CREATININE 0.92 09/16/2019 1107      Component Value Date/Time   CALCIUM 9.1 09/16/2019 1107   ALKPHOS 123 09/16/2019 1107   AST 21 09/16/2019 1107   ALT 16 09/16/2019 1107   BILITOT 0.5 09/16/2019 1107       RADIOGRAPHIC STUDIES:  DG Chest 2 View  Result Date: 09/16/2019 CLINICAL DATA:  69 year old male with history of pneumonia from COVID-19 infection in February 2021. Follow-up study. EXAM: CHEST - 2 VIEW COMPARISON:  Chest x-ray 06/22/2019. FINDINGS: Lung volumes are normal. Patchy multifocal areas of interstitial prominence are noted throughout the lungs bilaterally, significantly improved compared to the prior examination. No confluent consolidative airspace disease. No pleural effusions. No suspicious appearing pulmonary nodules or masses are noted. No pneumothorax. No evidence of pulmonary edema. Heart size is normal. Upper mediastinal contours are within normal limits. Aortic atherosclerosis. Small bone island in the posterior aspect of the right fifth rib again incidentally noted. IMPRESSION: 1. Resolving changes of  COVID-19 infection with residual areas of interstitial prominence. The possibility of mild post infectious/inflammatory fibrosis should be considered. Outpatient referral to Pulmonology for further evaluation should be considered. 2. Aortic atherosclerosis. Electronically Signed   By: Vinnie Langton M.D.   On: 09/16/2019 16:07       ASSESSMENT AND PLAN:  1. Chronic lymphocytic leukemia ([place]) Stage II CLL by Rai system: -Diagnosed around 2010, observation since then. -Denies any fevers, night sweats or weight loss in the last 6 months. -Patient was diagnosed with COVID-19 on 03/07/2020.  He was also admitted in March for IVIG infusion. -Physical examination shows mild adenopathy in the neck region, posterior triangle which are stable.  Labs completed on 09/16/2019 reveals a white blood cell count of 92.8 with a normal hemoglobin of 13.9 g/dL and minimal thrombocytopenia 130,000.  Differential reveals an absolute lymphocyte count of 85 and monocyte count of 2.8.  Metabolic panel is unimpressive.  Vitamin D within normal limits at 46.8.  Vitamin B12 within normal limits at 402.  IgG low at 544 and IgA low at 38.  IgM lower limits normal 2080.  With new development of thrombocytopenia, will have him return in 8 weeks (instead of 3-4 months) for follow-up and labs.  If his platelet count is trending downward, we may need to consider starting systemic therapy for CLL.  I  would wait until his platelet count is < 100K consistently before considering therapy.  Nevertheless, his future platelet trend will be important in order to make treatment initiation decision.  Labs in 8 weeks: CBC diff, CMET, LDH, ESR, and IgG/IgM/IgA.  Return in 8 weeks for follow-up.   2. Hypogammaglobulinemia ([place]) Secondary to CLL and therefore at increased risk for infections.  He required 1 dose of IVIG during COVID-19 infection  3. History of COVID-19 dx 05-09-19 He asked about COVID-19 immunization and I have strongly  urged him to proceed immunization given his CLL and immunocompromise status.  4. Tobacco abuse Smoking cessation education provided today.  5. COPD GOLD 0 Stable   ORDERS PLACED FOR THIS ENCOUNTER: Orders Placed This Encounter  Procedures  . CBC with Differential  . Comprehensive metabolic panel  . Lactate dehydrogenase  . IgG, IgA, IgM  . Sedimentation rate    MEDICATIONS PRESCRIBED THIS ENCOUNTER: No orders of the defined types were placed in this encounter.   All questions were answered. The patient knows to call the clinic with any problems, questions or concerns. We can certainly see the patient much sooner if necessary.  Patient and plan discussed with Dr. Derek Jack and he is in agreement with the aforementioned.   This note is electronically signed by: Robynn Pane, PA-C 09/23/2019 4:36 PM

## 2019-09-30 NOTE — Progress Notes (Signed)
Cardiology Office Note  Date: 10/01/2019   ID: LINDSEY HOMMEL, DOB 06-20-1950, MRN [id number]  PCP:  Redmond School, MD  Cardiologist:  Carlyle Dolly, MD Electrophysiologist:  None   Chief Complaint: F/U Aortic valve stenosis  History of Present Illness: Alfred Brown is a 69 y.o. male with a history of palpitations, aortic stenosis/ possible bicuspid AV, COPD / hx of smoking (via PFT's and follows with pulmonology), CLL( follows at [facility] S/P Covid 05/2019.  Last saw Dr Harl Bowie on 07/12/2019: Palpitations; long hx of symptoms. Mild and infrequent at times. Possible bicuspid aortic valve questionable on prior imaging. Mild to mod AS (mean gradient 17, AVA VTI 1.4) Echo 2019. COPD exacerbation previous month. PFTs positive for mild obstructive airway disease with air trapping and moderate diffusion defect.  Presents today for follow-up and continues to state he has palpitations at least once every 2 weeks.  Has had 3 episodes in June.  States each time he took the Cardizem it took about an hour and a half for the palpitations to resolve.  States when these occur he has chest pressure/chest tightness which eventually resolves after the palpitations resolve from taking the Cardizem.  He denies any other anginal or exertional symptoms, orthostatic symptoms, CVA or TIA-like symptoms, PND Cardama orthopnea, claudication, DVT or PE-like symptoms, or lower extremity edema.   Past Medical History:  Diagnosis Date   Aortic valve disorder    Mild insufficiency   Chronic lymphocytic leukemia ([place])    01/2012: WBC-90.2, H&H-15.1/45.3, platelets-185 03/31/12: Verified with Dr. Tressie Stalker that no precautions or modification of medical regime are required prior to orthopaedic surgery.    CLL (chronic lymphocytic leukemia) (H[facility])    CMV (cytomegalovirus) (HCC)    COPD (chronic obstructive pulmonary disease) (HCC)    Depression with anxiety    DJD (degenerative joint disease)    GERD  (gastroesophageal reflux disease)    Hyperlipidemia    Hypogammaglobulinemia ([place]) 09/23/2019   Lipoma    left chest wall   Palpitations 2004   PVCs; borderline stress nuclear in 2004, negative in 2007; Echo 2007; AV-sclerotic, very mild AI   Pneumonia    Right bundle branch block    + left posterior fascicular block   Tobacco abuse    80 pack years    Past Surgical History:  Procedure Laterality Date   COLONOSCOPY  01/2012   Negative screening study   GANGLION CYST EXCISION  1997   left wrist   ROTATOR CUFF REPAIR  1997   right   SHOULDER ARTHROSCOPY WITH SUBACROMIAL DECOMPRESSION  04/09/2012   Procedure: SHOULDER ARTHROSCOPY WITH SUBACROMIAL DECOMPRESSION;  Surgeon: Ninetta Lights, MD;  Location: [facility];  Service: Orthopedics;  Laterality: Left;  LEFT SHOULDER ARTHROSCOPY, SUBACROMIAL DECOMPRESSION, PARTIAL ACROMIOPLASTY WITH CORACOACROMIAL RELEASE, DISTAL CLAVICULECTOMY WITH ROTATOR CUFF REPAIR, DEBRIDEMENT OF LABRUM    Current Outpatient Medications  Medication Sig Dispense Refill   albuterol (PROVENTIL HFA;VENTOLIN HFA) 108 (90 BASE) MCG/ACT inhaler Inhale 2 puffs into the lungs every 6 (six) hours as needed for wheezing or shortness of breath.      ALPRAZolam (XANAX) 1 MG tablet Take 1 mg by mouth 3 (three) times daily as needed for anxiety.      aspirin 325 MG tablet Take 325 mg by mouth daily.       buPROPion (WELLBUTRIN SR) 150 MG 12 hr tablet Take 150 mg by mouth daily.      calcium carbonate (OS-CAL) 600 MG TABS Take  600 mg by mouth daily.       celecoxib (CELEBREX) 100 MG capsule Take 100 mg by mouth 2 (two) times daily.     clotrimazole-betamethasone (LOTRISONE) cream Apply 1 application topically daily as needed. Affected area     diltiazem (CARDIZEM) 30 MG tablet Take 30 mg by mouth as needed.     fish oil-omega-3 fatty acids 1000 MG capsule Take 1 g by mouth daily.      fluticasone (FLONASE) 50 MCG/ACT nasal spray Place 2  sprays into the nose daily.       HYDROcodone-acetaminophen (NORCO/VICODIN) 5-325 MG per tablet Take 1 tablet by mouth every 6 (six) hours as needed for moderate pain.      Loratadine 10 MG CAPS Take 10 mg by mouth daily.      lovastatin (MEVACOR) 20 MG tablet Take 20 mg by mouth daily.     Multiple Vitamin (MULTIVITAMIN) tablet Take 1 tablet by mouth daily.       niacin (NIASPAN) 500 MG CR tablet Take 500 mg by mouth daily.     pentoxifylline (TRENTAL) 400 MG CR tablet Take 400 mg by mouth 3 (three) times daily.     valACYclovir (VALTREX) 500 MG tablet Take 500 mg by mouth daily.     metoprolol tartrate (LOPRESSOR) 25 MG tablet Take 1 tablet (25 mg total) by mouth 2 (two) times daily. 180 tablet 1   No current facility-administered medications for this visit.   Allergies:  Patient has no known allergies.   Social History: The patient  reports that he has been smoking cigarettes. He has a 45.00 pack-year smoking history. He has never used smokeless tobacco. He reports that he does not drink alcohol and does not use drugs.   Family History: The patient's family history includes Cancer in his brother and sister; Colon cancer in his sister; Heart attack in his father; Stroke in his mother.   ROS:  Please see the history of present illness. Otherwise, complete review of systems is positive for none.  All other systems are reviewed and negative.   Physical Exam: VS:  BP 122/62    Pulse 83    Ht 6' (1.829 m)    Wt 178 lb (80.7 kg)    SpO2 98%    BMI 24.14 kg/m , BMI Body mass index is 24.14 kg/m.  Wt Readings from Last 3 Encounters:  10/01/19 178 lb (80.7 kg)  09/23/19 178 lb 12.8 oz (81.1 kg)  09/17/19 176 lb (79.8 kg)    General: Patient appears comfortable at rest. Neck: Supple, no elevated JVP or carotid bruits, no thyromegaly. Lungs: Clear to auscultation, nonlabored breathing at rest. Cardiac: Regular rate and rhythm, no S3 or significant systolic murmur, no pericardial  rub. Extremities: No pitting edema, distal pulses 2+. Skin: Warm and dry. Musculoskeletal: No kyphosis. Neuropsychiatric: Alert and oriented x3, affect grossly appropriate.  ECG:  An ECG dated 10/01/2019 was personally reviewed today and demonstrated:  Normal sinus rhythm rate of 81, possible left atrial enlargement, RBBB, left posterior fascicular block  Recent Labwork: 06/04/2019: Magnesium 1.8 09/16/2019: ALT 16; AST 21; BUN 20; Creatinine, Ser 0.92; Hemoglobin 13.9; Platelets 130; Potassium 4.7; Sodium 140  No results found for: CHOL, TRIG, HDL, CHOLHDL, VLDL, LDLCALC, LDLDIRECT  Other Studies Reviewed Today:  Diagnostic Studies Jan 2021 carotid US IMPRESSION: 1. Bilateral carotid bifurcation plaque resulting in less than 50% diameter ICA stenosis. 2. Antegrade bilateral vertebral arterial flow.  Echo 08/12/2019  1. Left ventricular ejection fraction,  by estimation, is 55 to 60%. The left ventricle has normal function. The left ventricle has no regional wall motion abnormalities. Left ventricular diastolic parameters are indeterminate. 2. Tachycardia with heart rate 135 noted near beginning of study. Same QRS morphology and no obvious P waves - question SVT, possibly atrial flutter. 3. Right ventricular systolic function is normal. The right ventricular size is normal. Tricuspid regurgitation signal is inadequate for assessing PA pressure. 4. The mitral valve is grossly normal, mild annular calcification. Trivial mitral valve regurgitation. 5. The aortic valve is likely tricuspid, moderately calcified and not well visualized. Cusp excursion is limited. Aortic valve regurgitation is mild. Aortic valve mean gradient measures 12.0 mmHg. Appears that LVOT VTI may be overestimated and the peak AV velocity is higher than that recorded. Cannot be conclusive about degree of aortic stenosis, but suspect moderate. Consider limited study with further interrogation, particularly if heart  rate and rhythm are in better control. Comparison(s): Previous Echo showed LVEF 60-65%, mild to mod AS (mean grad 17, AVA VTI 1.4)   Cardiac event monitor 08/20/2019 Study Highlights   7 day event monitor  Min HR 60, Max HR 129, Avg HR 82  No symptoms reported  Telemetry tracings show sinus rhythm  No significant arrhythmias   PFT's 08/03/2019 Pulmonary Function Diagnosis: Mild Obstructive Airways Disease with air trapping Moderate Diffusion Defect    Assessment and Plan:  1. Palpitations   2. Chronic obstructive pulmonary disease, unspecified COPD type ([place])   3. Aortic valve stenosis, etiology of cardiac valve disease unspecified    1. Palpitations Recent event monitor; no significant arrhythmias.  Continues to complain of palpitations occurring about every 2 weeks.  States he has had 3 recent episodes in June.  States when he takes the Cardizem it takes about an hour and a half or so for palpitations to resolve.  States when the palpitations occur he does have chest tightness/chest pressure/chest pain.  Denies any radiation to neck arm back or jaw.  Denies any associated nausea vomiting diaphoresis.  Start metoprolol 12.5 mg p.o. twice daily.  Use Cardizem 30 mg for breakthrough palpitations as needed.  2. Chronic obstructive pulmonary disease, unspecified COPD type ([place]) PFT's mild obstructive airway disease with air trapping, Moderate diffusion defect  3. Aortic valve stenosis, etiology of cardiac valve disease unspecified The aortic valve is likely tricuspid, moderately calcified and not well visualized. Cusp excursion is limited. Aortic valve regurgitation is mild. Aortic valve mean gradient measures 12.0 mmHg. Appears that LVOT VTI may be overestimated and the peak AV velocity is higher than that recorded. Cannot be conclusive about degree of aortic stenosis, but suspect moderate. Consider limited study with further interrogation, particularly if heart rate and rhythm are  in better control.  Patient denies any dyspnea, angina, or near syncope.  Medication Adjustments/Labs and Tests Ordered: Current medicines are reviewed at length with the patient today.  Concerns regarding medicines are outlined above.   Disposition: Follow-up with Dr. Harl Bowie or APP 6 months  Signed, Levell July, NP 10/01/2019 9:36 AM    [facility] [place], [place], [place] [address] Phone: [phone]; Fax: [phone]

## 2019-10-01 ENCOUNTER — Ambulatory Visit (INDEPENDENT_AMBULATORY_CARE_PROVIDER_SITE_OTHER): Payer: Medicare Other | Admitting: Family Medicine

## 2019-10-01 ENCOUNTER — Telehealth: Payer: Self-pay | Admitting: Family Medicine

## 2019-10-01 ENCOUNTER — Other Ambulatory Visit: Payer: Self-pay

## 2019-10-01 ENCOUNTER — Encounter: Payer: Self-pay | Admitting: Family Medicine

## 2019-10-01 VITALS — BP 122/62 | HR 83 | Ht 72.0 in | Wt 178.0 lb

## 2019-10-01 DIAGNOSIS — R002 Palpitations: Secondary | ICD-10-CM

## 2019-10-01 DIAGNOSIS — J449 Chronic obstructive pulmonary disease, unspecified: Secondary | ICD-10-CM

## 2019-10-01 DIAGNOSIS — I350 Nonrheumatic aortic (valve) stenosis: Secondary | ICD-10-CM | POA: Diagnosis not present

## 2019-10-01 MED ORDER — METOPROLOL TARTRATE 25 MG PO TABS: 25.0000 mg | ORAL_TABLET | Freq: Two times a day (BID) | ORAL | 1 refills | Status: DC

## 2019-10-01 NOTE — Telephone Encounter (Signed)
Patient has a question about RX metoprolol tartrate (LOPRESSOR) 25 MG tablet. Question about the dosage amount.

## 2019-10-01 NOTE — Telephone Encounter (Signed)
Attempted to reach, line busy-cc

## 2019-10-01 NOTE — Telephone Encounter (Signed)
Pt aware that he is to take Metoprolol 12.5 mg bid

## 2019-10-01 NOTE — Patient Instructions (Addendum)
Your physician wants you to follow-up in: [place] will receive a reminder letter in the mail two months in advance. If you don't receive a letter, please call our office to schedule the follow-up appointment.  Your physician has recommended you make the following change in your medication:   START METOPROLOL 12.5 MG TWICE DAILY   TAKE DILTIAZEM 30 MG AS NEEDED FOR PALPITATIONS   Thank you for choosing [facility]!!

## 2019-10-29 DIAGNOSIS — C9190 Lymphoid leukemia, unspecified not having achieved remission: Secondary | ICD-10-CM | POA: Diagnosis not present

## 2019-10-29 DIAGNOSIS — Z720 Tobacco use: Secondary | ICD-10-CM | POA: Diagnosis not present

## 2019-10-29 DIAGNOSIS — J449 Chronic obstructive pulmonary disease, unspecified: Secondary | ICD-10-CM | POA: Diagnosis not present

## 2019-10-29 DIAGNOSIS — M1991 Primary osteoarthritis, unspecified site: Secondary | ICD-10-CM | POA: Diagnosis not present

## 2019-11-15 ENCOUNTER — Other Ambulatory Visit (HOSPITAL_COMMUNITY): Payer: 59

## 2019-11-16 DIAGNOSIS — H6123 Impacted cerumen, bilateral: Secondary | ICD-10-CM | POA: Diagnosis not present

## 2019-11-16 DIAGNOSIS — F17290 Nicotine dependence, other tobacco product, uncomplicated: Secondary | ICD-10-CM | POA: Diagnosis not present

## 2019-11-16 DIAGNOSIS — J449 Chronic obstructive pulmonary disease, unspecified: Secondary | ICD-10-CM | POA: Diagnosis not present

## 2019-11-16 DIAGNOSIS — Z719 Counseling, unspecified: Secondary | ICD-10-CM | POA: Diagnosis not present

## 2019-11-16 DIAGNOSIS — Z23 Encounter for immunization: Secondary | ICD-10-CM | POA: Diagnosis not present

## 2019-11-16 DIAGNOSIS — Z6824 Body mass index (BMI) 24.0-24.9, adult: Secondary | ICD-10-CM | POA: Diagnosis not present

## 2019-11-16 DIAGNOSIS — K219 Gastro-esophageal reflux disease without esophagitis: Secondary | ICD-10-CM | POA: Diagnosis not present

## 2019-11-22 ENCOUNTER — Ambulatory Visit (HOSPITAL_COMMUNITY): Payer: 59 | Admitting: Hematology

## 2019-11-23 ENCOUNTER — Inpatient Hospital Stay (HOSPITAL_COMMUNITY): Payer: Medicare Other | Attending: Hematology

## 2019-11-23 DIAGNOSIS — C9110 Chronic lymphocytic leukemia of B-cell type not having achieved remission: Secondary | ICD-10-CM | POA: Diagnosis not present

## 2019-11-23 DIAGNOSIS — J449 Chronic obstructive pulmonary disease, unspecified: Secondary | ICD-10-CM | POA: Insufficient documentation

## 2019-11-23 DIAGNOSIS — D801 Nonfamilial hypogammaglobulinemia: Secondary | ICD-10-CM | POA: Diagnosis not present

## 2019-11-23 DIAGNOSIS — Z79899 Other long term (current) drug therapy: Secondary | ICD-10-CM | POA: Diagnosis not present

## 2019-11-23 DIAGNOSIS — F17210 Nicotine dependence, cigarettes, uncomplicated: Secondary | ICD-10-CM | POA: Diagnosis not present

## 2019-11-23 LAB — COMPREHENSIVE METABOLIC PANEL
ALT: 16 U/L (ref 0–44)
AST: 21 U/L (ref 15–41)
Albumin: 4.4 g/dL (ref 3.5–5.0)
Alkaline Phosphatase: 116 U/L (ref 38–126)
Anion gap: 8 (ref 5–15)
BUN: 22 mg/dL (ref 8–23)
CO2: 26 mmol/L (ref 22–32)
Calcium: 8.9 mg/dL (ref 8.9–10.3)
Chloride: 106 mmol/L (ref 98–111)
Creatinine, Ser: 1.07 mg/dL (ref 0.61–1.24)
GFR calc Af Amer: >60 mL/min (ref >60)
GFR calc non Af Amer: >60 mL/min (ref >60)
Glucose, Bld: 108 mg/dL — ABNORMAL HIGH (ref 70–99)
Potassium: 4.1 mmol/L (ref 3.5–5.1)
Sodium: 140 mmol/L (ref 135–145)
Total Bilirubin: 0.7 mg/dL (ref 0.3–1.2)
Total Protein: 6.6 g/dL (ref 6.5–8.1)

## 2019-11-23 LAB — CBC WITH DIFFERENTIAL/PLATELET
Basophils Absolute: 0 10*3/uL (ref 0.0–0.1)
Basophils Relative: 0 %
Eosinophils Absolute: 1.2 10*3/uL — ABNORMAL HIGH (ref 0.0–0.5)
Eosinophils Relative: 1 %
HCT: 45.2 % (ref 39.0–52.0)
Hemoglobin: 13.8 g/dL (ref 13.0–17.0)
Lymphocytes Relative: 91 %
Lymphs Abs: 108.8 10*3/uL — ABNORMAL HIGH (ref 0.7–4.0)
MCH: 29.4 pg (ref 26.0–34.0)
MCHC: 30.5 g/dL (ref 30.0–36.0)
MCV: 96.2 fL (ref 80.0–100.0)
Monocytes Absolute: 1.2 10*3/uL — ABNORMAL HIGH (ref 0.1–1.0)
Monocytes Relative: 1 %
Neutro Abs: 8.4 10*3/uL — ABNORMAL HIGH (ref 1.7–7.7)
Neutrophils Relative %: 7 %
Platelets: 122 10*3/uL — ABNORMAL LOW (ref 150–400)
RBC: 4.7 MIL/uL (ref 4.22–5.81)
RDW: 14.9 % (ref 11.5–15.5)
WBC Morphology: ABNORMAL
WBC: 119.6 10*3/uL (ref 4.0–10.5)
nRBC: 0 % (ref 0.0–0.2)

## 2019-11-23 LAB — LACTATE DEHYDROGENASE: LDH: 142 U/L (ref 98–192)

## 2019-11-23 LAB — SEDIMENTATION RATE: Sed Rate: 6 mm/hr (ref 0–16)

## 2019-11-23 NOTE — Progress Notes (Signed)
CRITICAL VALUE ALERT  Critical Value:  WBC 119.6  Date & Time Notied:  11/23/2019 at 1116  Provider Notified: Dr. Delton Coombes  Orders Received/Actions taken: n/a

## 2019-11-24 ENCOUNTER — Ambulatory Visit (INDEPENDENT_AMBULATORY_CARE_PROVIDER_SITE_OTHER): Payer: Medicare Other | Admitting: Urology

## 2019-11-24 ENCOUNTER — Encounter: Payer: Self-pay | Admitting: Urology

## 2019-11-24 ENCOUNTER — Other Ambulatory Visit: Payer: Self-pay

## 2019-11-24 VITALS — BP 116/60 | HR 72 | Temp 97.8°F | Ht 72.0 in | Wt 178.0 lb

## 2019-11-24 DIAGNOSIS — N486 Induration penis plastica: Secondary | ICD-10-CM

## 2019-11-24 LAB — IGG, IGA, IGM
IgA: 35 mg/dL — ABNORMAL LOW (ref 61–437)
IgG (Immunoglobin G), Serum: 512 mg/dL — ABNORMAL LOW (ref 603–1613)
IgM (Immunoglobulin M), Srm: 28 mg/dL (ref 20–172)

## 2019-11-24 LAB — URINALYSIS, ROUTINE W REFLEX MICROSCOPIC
Bilirubin, UA: NEGATIVE
Glucose, UA: NEGATIVE
Ketones, UA: NEGATIVE
Leukocytes,UA: NEGATIVE
Nitrite, UA: NEGATIVE
Protein,UA: NEGATIVE
Specific Gravity, UA: 1.02 (ref 1.005–1.030)
Urobilinogen, Ur: 0.2 mg/dL (ref 0.2–1.0)
pH, UA: 5 (ref 5.0–7.5)

## 2019-11-24 LAB — MICROSCOPIC EXAMINATION
Bacteria, UA: NONE SEEN
Epithelial Cells (non renal): NONE SEEN /hpf (ref 0–10)
Renal Epithel, UA: NONE SEEN /hpf
WBC, UA: NONE SEEN /hpf (ref 0–5)

## 2019-11-24 NOTE — Progress Notes (Signed)
11/24/2019 10:59 AM   [facility] 1950-09-16 [id number]  Referring provider: Redmond School, MD [address] [place],  [place] [address]  Peyronies disease  HPI: Alfred Brown is a 69yo here for followup for peyronies disease. No change in curvature. No pain with erections. He is currently taking trental.   His records from [facility] are as follows: I have penile curvature or pain with erections.  HPI: Alfred Brown is a 69 year-old male established patient who is here for penile curvature or pain with erections.  He does have penile curvature with his erections. He has had penile curvature with his erections for 6 months. His penis curves upward. His penile curvature is based in the middle. His curvature has not changed in the past 6 months. His curvature has changed in the past 12 months. His symptoms have been worse over the last year.   He has not had a history of trauma to the urethra. He has not noticed a knot on his penis. He does not have penile narrowing with his erections. He does not have penile shortening with his erections. He does not have penile buckling with his erections. His symptoms of Peyronie's Disease do have an impact on intercourse. He has not been previously treated.   05/27/2019: 6 months ago he developed pain with erection and then developed dorsal curvature. Very mild ED   08/26/2019: He noted slight improvement in his curvature. Based on pictures provided by the patient he has 45 degree dorsal curvature at the mid penile shaft.     PMH: Past Medical History:  Diagnosis Date   Aortic valve disorder    Mild insufficiency   Chronic lymphocytic leukemia ([place])    01/2012: WBC-90.2, H&H-15.1/45.3, platelets-185 03/31/12: Verified with Dr. Tressie Stalker that no precautions or modification of medical regime are required prior to orthopaedic surgery.    CLL (chronic lymphocytic leukemia) (HCC)    CMV (cytomegalovirus) (HCC)    COPD (chronic obstructive pulmonary  disease) (HCC)    Depression with anxiety    DJD (degenerative joint disease)    GERD (gastroesophageal reflux disease)    Hyperlipidemia    Hypogammaglobulinemia ([place]) 09/23/2019   Lipoma    left chest wall   Palpitations 2004   PVCs; borderline stress nuclear in 2004, negative in 2007; Echo 2007; AV-sclerotic, very mild AI   Pneumonia    Right bundle branch block    + left posterior fascicular block   Tobacco abuse    80 pack years    Surgical History: Past Surgical History:  Procedure Laterality Date   COLONOSCOPY  01/2012   Negative screening study   GANGLION CYST EXCISION  1997   left wrist   ROTATOR CUFF REPAIR  1997   right   SHOULDER ARTHROSCOPY WITH SUBACROMIAL DECOMPRESSION  04/09/2012   Procedure: SHOULDER ARTHROSCOPY WITH SUBACROMIAL DECOMPRESSION;  Surgeon: Ninetta Lights, MD;  Location: [facility];  Service: Orthopedics;  Laterality: Left;  LEFT SHOULDER ARTHROSCOPY, SUBACROMIAL DECOMPRESSION, PARTIAL ACROMIOPLASTY WITH CORACOACROMIAL RELEASE, DISTAL CLAVICULECTOMY WITH ROTATOR CUFF REPAIR, DEBRIDEMENT OF LABRUM    Home Medications:  Allergies as of 11/24/2019   No Known Allergies     Medication List       Accurate as of November 24, 2019 10:59 AM. If you have any questions, ask your nurse or doctor.        albuterol 108 (90 Base) MCG/ACT inhaler Commonly known as: VENTOLIN HFA Inhale 2 puffs into the lungs every 6 (six) hours  as needed for wheezing or shortness of breath.   ALPRAZolam 1 MG tablet Commonly known as: XANAX Take 1 mg by mouth 3 (three) times daily as needed for anxiety.   aspirin 325 MG tablet Take 325 mg by mouth daily.   buPROPion 150 MG 12 hr tablet Commonly known as: WELLBUTRIN SR Take 150 mg by mouth daily.   calcium carbonate 600 MG Tabs tablet Commonly known as: OS-CAL Take 600 mg by mouth daily.   celecoxib 100 MG capsule Commonly known as: CELEBREX Take 100 mg by mouth 2 (two) times daily.    clotrimazole-betamethasone cream Commonly known as: LOTRISONE Apply 1 application topically daily as needed. Affected area   diltiazem 30 MG tablet Commonly known as: CARDIZEM Take 30 mg by mouth as needed.   fish oil-omega-3 fatty acids 1000 MG capsule Take 1 g by mouth daily.   fluticasone 50 MCG/ACT nasal spray Commonly known as: FLONASE Place 2 sprays into the nose daily.   HYDROcodone-acetaminophen 5-325 MG tablet Commonly known as: NORCO/VICODIN Take 1 tablet by mouth every 6 (six) hours as needed for moderate pain.   Loratadine 10 MG Caps Take 10 mg by mouth daily.   lovastatin 20 MG tablet Commonly known as: MEVACOR Take 20 mg by mouth daily.   metoprolol tartrate 25 MG tablet Commonly known as: LOPRESSOR Take 12.5 mg by mouth 2 (two) times daily.   multivitamin tablet Take 1 tablet by mouth daily.   niacin 500 MG CR tablet Commonly known as: NIASPAN Take 500 mg by mouth daily.   pentoxifylline 400 MG CR tablet Commonly known as: TRENTAL Take 400 mg by mouth 3 (three) times daily.   valACYclovir 500 MG tablet Commonly known as: VALTREX Take 500 mg by mouth daily.       Allergies: No Known Allergies  Family History: Family History  Problem Relation Age of Onset   Stroke Mother    Heart attack Father    Cancer Sister        colon   Colon cancer Sister    Cancer Brother        2 brothers died with lung cancer    Social History:  reports that he has been smoking cigarettes. He has a 45.00 pack-year smoking history. He has never used smokeless tobacco. He reports that he does not drink alcohol and does not use drugs.  ROS: All other review of systems were reviewed and are negative except what is noted above in HPI  Physical Exam: There were no vitals taken for this visit.  Constitutional:  Alert and oriented, No acute distress. HEENT: [place] AT, moist mucus membranes.  Trachea midline, no masses. Cardiovascular: No clubbing, cyanosis, or  edema. Respiratory: Normal respiratory effort, no increased work of breathing. GI: Abdomen is soft, nontender, nondistended, no abdominal masses GU: No CVA tenderness.  Lymph: No cervical or inguinal lymphadenopathy. Skin: No rashes, bruises or suspicious lesions. Neurologic: Grossly intact, no focal deficits, moving all 4 extremities. Psychiatric: Normal mood and affect.  Laboratory Data: Lab Results  Component Value Date   WBC 119.6 (HH) 11/23/2019   HGB 13.8 11/23/2019   HCT 45.2 11/23/2019   MCV 96.2 11/23/2019   PLT 122 (L) 11/23/2019    Lab Results  Component Value Date   CREATININE 1.07 11/23/2019    No results found for: PSA  No results found for: TESTOSTERONE  No results found for: HGBA1C  Urinalysis    Component Value Date/Time   COLORURINE YELLOW 05/09/2019 1419  APPEARANCEUR Clear 11/24/2019 1047   LABSPEC 1.028 05/09/2019 1419   PHURINE 6.0 05/09/2019 1419   GLUCOSEU Negative 11/24/2019 1047   HGBUR NEGATIVE 05/09/2019 1419   BILIRUBINUR Negative 11/24/2019 1047   KETONESUR NEGATIVE 05/09/2019 1419   PROTEINUR Negative 11/24/2019 1047   PROTEINUR 30 (A) 05/09/2019 1419   NITRITE Negative 11/24/2019 1047   NITRITE NEGATIVE 05/09/2019 1419   LEUKOCYTESUR Negative 11/24/2019 1047   LEUKOCYTESUR NEGATIVE 05/09/2019 1419    Lab Results  Component Value Date   LABMICR See below: 11/24/2019   WBCUA None seen 11/24/2019   LABEPIT None seen 11/24/2019   BACTERIA None seen 11/24/2019    Pertinent Imaging:  No results found for this or any previous visit.  No results found for this or any previous visit.  No results found for this or any previous visit.  No results found for this or any previous visit.  No results found for this or any previous visit.  No results found for this or any previous visit.  No results found for this or any previous visit.  No results found for this or any previous visit.   Assessment & Plan:    1. Peyronie's  disease We discussed the various treatment options including medical therapy, penile plication, verapamil injection, xiaflex therapy. At this time the patient is unsure which treatment he wants to pursue.  - Urinalysis, Routine w reflex microscopic   No follow-ups on file.  Nicolette Bang, MD  [facility] [place]

## 2019-11-24 NOTE — Progress Notes (Signed)
Urological Symptom Review  Patient is experiencing the following symptoms: Get up at night to urinate   Review of Systems  Gastrointestinal (upper)  : Negative for upper GI symptoms  Gastrointestinal (lower) : Negative for lower GI symptoms  Constitutional : Fatigue  Skin: Negative for skin symptoms  Eyes: Negative for eye symptoms  Ear/Nose/Throat : Negative for Ear/Nose/Throat symptoms  Hematologic/Lymphatic: Swollen glands Easy bruising  Cardiovascular : Negative for cardiovascular symptoms  Respiratory : Cough Shortness of breath  Endocrine: Negative for endocrine symptoms  Musculoskeletal: Negative for musculoskeletal symptoms  Neurological: Negative for neurological symptoms  Psychologic: Depression

## 2019-11-24 NOTE — Patient Instructions (Signed)
Collagenase injection (Dupuytren's Contracture/Peyronie's Disease) What is this medicine? COLLAGENASE (kohl LAH jen ace) is used to treat Dupuytren's contracture. This medicine may help straighten a bent finger by breaking up hard tissue. It is also used for Peyronie's disease by breaking up the hard tissue plaque that causes the curvature in the penis. This medicine may be used for other purposes; ask your health care provider or pharmacist if you have questions. COMMON BRAND NAME(S): Xiaflex What should I tell my health care provider before I take this medicine? They need to know if you have any of these conditions:  hemophilia  low platelet counts  take medicines that treat or prevent blood clots  an unusual or allergic reaction to collagenase, other medicines, foods, dyes, or preservatives  pregnant or trying to get pregnant  breast-feeding How should I use this medicine? This medicine is for injection into the hand or penis. It is given by a health care professional in a hospital or clinic setting. A special MedGuide will be given to you by the pharmacist with each prescription and refill. Be sure to read this information carefully each time. Talk to your pediatrician regarding the use of this medicine in children. Special care may be needed. Overdosage: If you think you have taken too much of this medicine contact a poison control center or emergency room at once. NOTE: This medicine is only for you. Do not share this medicine with others. What if I miss a dose? It is important not to miss your dose. Call your doctor or health care professional if you are unable to keep an appointment. What may interact with this medicine?  aspirin and aspirin-like medicines  certain medicines that treat or prevent blood clots like warfarin, enoxaparin, and dalteparin This list may not describe all possible interactions. Give your health care provider a list of all the medicines, herbs,  non-prescription drugs, or dietary supplements you use. Also tell them if you smoke, drink alcohol, or use illegal drugs. Some items may interact with your medicine. What should I watch for while using this medicine? Your condition will be monitored carefully while you are receiving this medicine. If being treated for Dupuytren's contracture, return to your healthcare provider the day after your hand is injected. In the meantime, do not flex or extend the fingers of your hand that was injected. Do not touch your finger that was injected, and elevate your hand until bedtime. Do not perform activity with the injected hand until you are told that it is OK. Follow any instructions about wearing a splint or performing finger exercises. Also, call your healthcare provider if you get increasing redness or swelling in the hand, if you have numbness or tingling in the treated finger, or if you have trouble bending the finger after the swelling goes down. If being treated for Peyronie's disease, you will need to return to your healthcare provider for a manual procedure that will stretch and help straighten your penis. Also, your healthcare provider will show you how to gently stretch your penis at home. Do not resume sexual activity until you are told that it is okay. Follow instructions on when to return for follow-up visits. Immediately call your doctor if you have trouble stretching or straightening your penis, or if you have pain or other concerns. Immediately call your healthcare provider if you get a fever or chills. What side effects may I notice from receiving this medicine? Side effects that you should report to your doctor or health  care professional as soon as possible:  allergic reactions like skin rash, itching or hives, swelling of the face, lips, or tongue  breathing problems  chest pain or palpitations  pain in your penis  pain when urinating  red or dark-brown urine  sudden loss of the  ability to maintain an erection  swelling of the injected hand  unusual swelling or bruising of the penis Side effects that usually do not require medical attention (report to your doctor or health care professional if they continue or are bothersome):  irritation at site where injected  pain at site where injected  unusual bleeding or bruising This list may not describe all possible side effects. Call your doctor for medical advice about side effects. You may report side effects to FDA at 1-800-FDA-1088. Where should I keep my medicine? This drug is given in a hospital or clinic and will not be stored at home. NOTE: This sheet is a summary. It may not cover all possible information. If you have questions about this medicine, talk to your doctor, pharmacist, or health care provider.  2020 Elsevier/Gold Standard (2015-04-20 09:34:13)

## 2019-11-30 ENCOUNTER — Other Ambulatory Visit: Payer: Self-pay

## 2019-11-30 ENCOUNTER — Inpatient Hospital Stay (HOSPITAL_BASED_OUTPATIENT_CLINIC_OR_DEPARTMENT_OTHER): Payer: Medicare Other | Admitting: Hematology

## 2019-11-30 VITALS — BP 134/58 | HR 75 | Temp 97.9°F | Resp 18 | Wt 180.6 lb

## 2019-11-30 DIAGNOSIS — M1991 Primary osteoarthritis, unspecified site: Secondary | ICD-10-CM | POA: Diagnosis not present

## 2019-11-30 DIAGNOSIS — C9110 Chronic lymphocytic leukemia of B-cell type not having achieved remission: Secondary | ICD-10-CM | POA: Diagnosis not present

## 2019-11-30 DIAGNOSIS — Z720 Tobacco use: Secondary | ICD-10-CM | POA: Diagnosis not present

## 2019-11-30 DIAGNOSIS — D801 Nonfamilial hypogammaglobulinemia: Secondary | ICD-10-CM | POA: Diagnosis not present

## 2019-11-30 DIAGNOSIS — J449 Chronic obstructive pulmonary disease, unspecified: Secondary | ICD-10-CM | POA: Diagnosis not present

## 2019-11-30 DIAGNOSIS — Z79899 Other long term (current) drug therapy: Secondary | ICD-10-CM | POA: Diagnosis not present

## 2019-11-30 DIAGNOSIS — C9190 Lymphoid leukemia, unspecified not having achieved remission: Secondary | ICD-10-CM | POA: Diagnosis not present

## 2019-11-30 DIAGNOSIS — F17210 Nicotine dependence, cigarettes, uncomplicated: Secondary | ICD-10-CM | POA: Diagnosis not present

## 2019-11-30 NOTE — Progress Notes (Signed)
[place] [place], [place] [address]   CLINIC:  Medical Oncology/Hematology  PCP:  [facility], [place] / [place] [address]  [phone]  REASON FOR VISIT:  Follow-up for CLL  PRIOR THERAPY: None  CURRENT THERAPY: Intermittent IVIG   INTERVAL HISTORY:  [name], a 69 y.o. male, returns for routine follow-up for his CLL. Alfred Brown was last seen on 06/22/2019.  Today he is accompanied by his wife. He reports feeling well and denies having any new adenopathy. He denies having any recent infections, F/C, night sweats, or weight loss. He got his Prevnar-13 on 8/17 at Dr. Nolon Rod office.  REVIEW OF SYSTEMS:  Review of Systems  Constitutional: Positive for fatigue (mild). Negative for appetite change, chills, diaphoresis, fever and unexpected weight change.  Respiratory: Positive for cough.   Cardiovascular: Positive for palpitations (d/t SVT).  Hematological: Bruises/bleeds easily.  All other systems reviewed and are negative.   PAST MEDICAL/SURGICAL HISTORY:  Past Medical History:  Diagnosis Date  . Aortic valve disorder    Mild insufficiency  . Chronic lymphocytic leukemia ([facility])    01/2012: WBC-90.2, H&H-15.1/45.3, platelets-185 03/31/12: Verified with Dr. Tressie Stalker that no precautions or modification of medical regime are required prior to orthopaedic surgery.   . CLL (chronic lymphocytic leukemia) ([facility])   . CMV (cytomegalovirus) ([facility])   . COPD (chronic obstructive pulmonary disease) ([facility])   . Depression with anxiety   . DJD (degenerative joint disease)   . GERD (gastroesophageal reflux disease)   . Hyperlipidemia   . Hypogammaglobulinemia ([facility]) 09/23/2019  . Lipoma    left chest wall  . Palpitations 2004   PVCs; borderline stress nuclear in 2004, negative in 2007; Echo 2007; AV-sclerotic, very mild AI  . Pneumonia   . Right bundle branch block    + left posterior fascicular block  . Tobacco abuse    80  pack years   Past Surgical History:  Procedure Laterality Date  . COLONOSCOPY  01/2012   Negative screening study  . GANGLION CYST EXCISION  1997   left wrist  . ROTATOR CUFF REPAIR  1997   right  . SHOULDER ARTHROSCOPY WITH SUBACROMIAL DECOMPRESSION  04/09/2012   Procedure: SHOULDER ARTHROSCOPY WITH SUBACROMIAL DECOMPRESSION;  Surgeon: Ninetta Lights, MD;  Location: [facility];  Service: Orthopedics;  Laterality: Left;  LEFT SHOULDER ARTHROSCOPY, SUBACROMIAL DECOMPRESSION, PARTIAL ACROMIOPLASTY WITH CORACOACROMIAL RELEASE, DISTAL CLAVICULECTOMY WITH ROTATOR CUFF REPAIR, DEBRIDEMENT OF LABRUM    SOCIAL HISTORY:  Social History   Socioeconomic History  . Marital status: Married    Spouse name: Not on file  . Number of children: Not on file  . Years of education: Not on file  . Highest education level: Not on file  Occupational History  . Not on file  Tobacco Use  . Smoking status: Current Every Day Smoker    Packs/day: 1.00    Years: 45.00    Pack years: 45.00    Types: Cigarettes  . Smokeless tobacco: Never Used  Substance and Sexual Activity  . Alcohol use: No    Alcohol/week: 0.0 standard drinks  . Drug use: No  . Sexual activity: Not on file  Other Topics Concern  . Not on file  Social History Narrative  . Not on file   Social Determinants of Health   Financial Resource Strain:   . Difficulty of Paying Living Expenses: Not on file  Food Insecurity:   . Worried About Running  Out of Food in the Last Year: Not on file  . Ran Out of Food in the Last Year: Not on file  Transportation Needs:   . Lack of Transportation (Medical): Not on file  . Lack of Transportation (Non-Medical): Not on file  Physical Activity:   . Days of Exercise per Week: Not on file  . Minutes of Exercise per Session: Not on file  Stress:   . Feeling of Stress : Not on file  Social Connections:   . Frequency of Communication with Friends and Family: Not on file  . Frequency  of Social Gatherings with Friends and Family: Not on file  . Attends Religious Services: Not on file  . Active Member of Clubs or Organizations: Not on file  . Attends Archivist Meetings: Not on file  . Marital Status: Not on file  Intimate Partner Violence:   . Fear of Current or Ex-Partner: Not on file  . Emotionally Abused: Not on file  . Physically Abused: Not on file  . Sexually Abused: Not on file    FAMILY HISTORY:  Family History  Problem Relation Age of Onset  . Stroke Mother   . Heart attack Father   . Cancer Sister        colon  . Colon cancer Sister   . Cancer Brother        2 brothers died with lung cancer    CURRENT MEDICATIONS:  Current Outpatient Medications  Medication Sig Dispense Refill  . albuterol (PROVENTIL HFA;VENTOLIN HFA) 108 (90 BASE) MCG/ACT inhaler Inhale 2 puffs into the lungs every 6 (six) hours as needed for wheezing or shortness of breath.     . ALPRAZolam (XANAX) 1 MG tablet Take 1 mg by mouth 3 (three) times daily as needed for anxiety.     [name] aspirin 325 MG tablet Take 325 mg by mouth daily.      [name] buPROPion (WELLBUTRIN SR) 150 MG 12 hr tablet Take 150 mg by mouth daily.     . calcium carbonate (OS-CAL) 600 MG TABS Take 600 mg by mouth daily.      . celecoxib (CELEBREX) 100 MG capsule Take 100 mg by mouth 2 (two) times daily.    . clotrimazole-betamethasone (LOTRISONE) cream Apply 1 application topically daily as needed. Affected area    . diltiazem (CARDIZEM) 30 MG tablet Take 30 mg by mouth as needed.    . fish oil-omega-3 fatty acids 1000 MG capsule Take 1 g by mouth daily.     . fluticasone (FLONASE) 50 MCG/ACT nasal spray Place 2 sprays into the nose daily.      [name] HYDROcodone-acetaminophen (NORCO/VICODIN) 5-325 MG per tablet Take 1 tablet by mouth every 6 (six) hours as needed for moderate pain.     . Loratadine 10 MG CAPS Take 10 mg by mouth daily.     [name] lovastatin (MEVACOR) 20 MG tablet Take 20 mg by mouth daily.    .  metoprolol tartrate (LOPRESSOR) 25 MG tablet Take 12.5 mg by mouth 2 (two) times daily.    . Multiple Vitamin (MULTIVITAMIN) tablet Take 1 tablet by mouth daily.      . niacin (NIASPAN) 500 MG CR tablet Take 500 mg by mouth daily.    . pentoxifylline (TRENTAL) 400 MG CR tablet Take 400 mg by mouth 3 (three) times daily.    . valACYclovir (VALTREX) 500 MG tablet Take 500 mg by mouth daily.     No current facility-administered medications for  this visit.    ALLERGIES:  No Known Allergies  PHYSICAL EXAM:  Performance status (ECOG): 1 - Symptomatic but completely ambulatory  Vitals:   11/30/19 1153  BP: (!) 134/58  Pulse: 75  Resp: 18  Temp: 97.9 F (36.6 C)  SpO2: 100%   Wt Readings from Last 3 Encounters:  11/30/19 180 lb 8.9 oz (81.9 kg)  11/24/19 178 lb (80.7 kg)  10/01/19 178 lb (80.7 kg)   Physical Exam Abdominal:     Palpations: There is splenomegaly (palpable on deep inspiration).  Lymphadenopathy:     Cervical: Cervical adenopathy present.     Right cervical: Posterior cervical adenopathy (sub-cm) present.     Left cervical: Posterior cervical adenopathy (sub-cm) present.     Upper Body:     Right upper body: Axillary adenopathy (sub-cm) present.     Left upper body: Axillary adenopathy (sub-cm) present.     Lower Body: Right inguinal adenopathy (sub-cm) present. Left inguinal adenopathy (sub-cm) present.     LABORATORY DATA:  I have reviewed the labs as listed.  CBC Latest Ref Rng & Units 11/23/2019 09/16/2019 06/22/2019  WBC 4.0 - 10.5 K/uL 119.6(HH) 92.8(HH) 98.4(HH)  Hemoglobin 13.0 - 17.0 g/dL 13.8 13.9 13.5  Hematocrit 39 - 52 % 45.2 45.6 44.4  Platelets 150 - 400 K/uL 122(L) 130(L) 158   CMP Latest Ref Rng & Units 11/23/2019 09/16/2019 06/22/2019  Glucose 70 - 99 mg/dL 108(H) 102(H) 119(H)  BUN 8 - 23 mg/dL 22 20 22   Creatinine 0.61 - 1.24 mg/dL 1.07 0.92 1.15  Sodium 135 - 145 mmol/L 140 140 141  Potassium 3.5 - 5.1 mmol/L 4.1 4.7 4.8  Chloride 98 - 111  mmol/L 106 106 107  CO2 22 - 32 mmol/L 26 28 27   Calcium 8.9 - 10.3 mg/dL 8.9 9.1 9.3  Total Protein 6.5 - 8.1 g/dL 6.6 6.5 6.9  Total Bilirubin 0.3 - 1.2 mg/dL 0.7 0.5 0.5  Alkaline Phos 38 - 126 U/L 116 123 121  AST 15 - 41 U/L 21 21 24   ALT 0 - 44 U/L 16 16 16       Component Value Date/Time   RBC 4.70 11/23/2019 1027   MCV 96.2 11/23/2019 1027   MCH 29.4 11/23/2019 1027   MCHC 30.5 11/23/2019 1027   RDW 14.9 11/23/2019 1027   LYMPHSABS 108.8 (H) 11/23/2019 1027   MONOABS 1.2 (H) 11/23/2019 1027   EOSABS 1.2 (H) 11/23/2019 1027   BASOSABS 0.0 11/23/2019 1027   Lab Results  Component Value Date   LDH 142 11/23/2019   LDH 139 09/16/2019   LDH 233 (H) 06/22/2019   Lab Results  Component Value Date   IGGSERUM 512 (L) 11/23/2019   IGGSERUM 544 (L) 09/16/2019   IGGSERUM 377 (L) 05/04/2019   IGA 35 (L) 11/23/2019   IGA 38 (L) 09/16/2019   IGA 28 (L) 05/04/2019   IGMSERUM 28 11/23/2019   IGMSERUM 28 09/16/2019   IGMSERUM 13 (L) 05/04/2019     DIAGNOSTIC IMAGING:  I have independently reviewed the scans and discussed with the patient. No results found.   ASSESSMENT:  1.  Stage II CLL by [facility]ai system: -Diagnosed around 2010, observation since then. -Denies any fevers, night sweats or weight loss in the last 6 months. -Patient was diagnosed with COVID-19 on 05/09/2019.  He was also admitted in March for IVIG infusion.   2.  Hypogammaglobulinemia: -This is from underlying CLL.  Trough immunoglobulins are low at 3 50-400.  He did  not have any recurrent infections. -However because of his recent COVID-19 infection, he received IVIG 400 mg/kg 1 dose on 06/03/2019.  3.  Shortness of breath on exertion: -he reports shortness of breath on exertion which has not improved since recent hospitalization. -I have done chest x-ray in the office today and reviewed it and compare it from chest x-ray from June 02, 2019.  Infiltrates have slightly improved.   PLAN:  1.  Stage II CLL by  Rai system: -Denies any fevers, night sweats or weight loss in the last 4 months. -Reviewed his labs from 11/23/2019.  White count increased to 119.  Platelet count is 122. -Physical exam shows small subcentimeter lymph nodes in the posterior neck triangle, bilateral axillary region and groins.  Spleen tip palpable with deep inspiration. -No indication for treatment at this time.  We will follow up in 4 months with labs.  2.  Hypogammaglobulinemia: -Quantitative immunoglobulins on 11/23/2019 shows IgG level of 512. -No recurrent infections.  Hence would not start him on any IVIG.   Orders placed this encounter:  No orders of the defined types were placed in this encounter.    Derek Jack, MD [facility] [phone]   I, Milinda Antis, am acting as a scribe for Dr. Sanda Linger.  I, Derek Jack MD, have reviewed the above documentation for accuracy and completeness, and I agree with the above.

## 2019-12-01 ENCOUNTER — Other Ambulatory Visit: Payer: Self-pay

## 2019-12-01 DIAGNOSIS — N486 Induration penis plastica: Secondary | ICD-10-CM

## 2019-12-01 NOTE — Progress Notes (Signed)
Medicare is not an insurance that [employer] and [employer] has contract with. Medication would be a buy and bill by the office.  Dr. Alyson Ingles sent referral for [facility] to perform xiaflex injections for this pt. [facility] will buy and bill for this medication.  Referral sent. Pt called- left message to return call.

## 2019-12-07 ENCOUNTER — Telehealth: Payer: Self-pay

## 2019-12-07 NOTE — Telephone Encounter (Signed)
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 Our location does not participate in buy and bill. Wife notified and referral was sent to [facility] who does buy and bill for Xiaflex.  Wife states pt does not wish to pursue at the time but will decide more when [facility] calls for referral

## 2019-12-16 DIAGNOSIS — C92Z0 Other myeloid leukemia not having achieved remission: Secondary | ICD-10-CM | POA: Diagnosis not present

## 2019-12-16 DIAGNOSIS — I10 Essential (primary) hypertension: Secondary | ICD-10-CM | POA: Diagnosis not present

## 2019-12-16 DIAGNOSIS — K219 Gastro-esophageal reflux disease without esophagitis: Secondary | ICD-10-CM | POA: Diagnosis not present

## 2019-12-16 DIAGNOSIS — J329 Chronic sinusitis, unspecified: Secondary | ICD-10-CM | POA: Diagnosis not present

## 2019-12-23 DIAGNOSIS — L821 Other seborrheic keratosis: Secondary | ICD-10-CM | POA: Diagnosis not present

## 2019-12-23 DIAGNOSIS — X32XXXD Exposure to sunlight, subsequent encounter: Secondary | ICD-10-CM | POA: Diagnosis not present

## 2019-12-23 DIAGNOSIS — L570 Actinic keratosis: Secondary | ICD-10-CM | POA: Diagnosis not present

## 2019-12-30 DIAGNOSIS — J449 Chronic obstructive pulmonary disease, unspecified: Secondary | ICD-10-CM | POA: Diagnosis not present

## 2019-12-30 DIAGNOSIS — M1991 Primary osteoarthritis, unspecified site: Secondary | ICD-10-CM | POA: Diagnosis not present

## 2019-12-30 DIAGNOSIS — Z720 Tobacco use: Secondary | ICD-10-CM | POA: Diagnosis not present

## 2019-12-30 DIAGNOSIS — C9190 Lymphoid leukemia, unspecified not having achieved remission: Secondary | ICD-10-CM | POA: Diagnosis not present

## 2020-01-12 DIAGNOSIS — E538 Deficiency of other specified B group vitamins: Secondary | ICD-10-CM | POA: Diagnosis not present

## 2020-01-12 DIAGNOSIS — R5383 Other fatigue: Secondary | ICD-10-CM | POA: Diagnosis not present

## 2020-01-12 DIAGNOSIS — M1991 Primary osteoarthritis, unspecified site: Secondary | ICD-10-CM | POA: Diagnosis not present

## 2020-01-12 DIAGNOSIS — R319 Hematuria, unspecified: Secondary | ICD-10-CM | POA: Diagnosis not present

## 2020-01-12 DIAGNOSIS — Z6825 Body mass index (BMI) 25.0-25.9, adult: Secondary | ICD-10-CM | POA: Diagnosis not present

## 2020-01-12 DIAGNOSIS — Z23 Encounter for immunization: Secondary | ICD-10-CM | POA: Diagnosis not present

## 2020-01-12 DIAGNOSIS — R400 Somnolence: Secondary | ICD-10-CM | POA: Diagnosis not present

## 2020-01-12 DIAGNOSIS — E559 Vitamin D deficiency, unspecified: Secondary | ICD-10-CM | POA: Diagnosis not present

## 2020-01-12 DIAGNOSIS — J449 Chronic obstructive pulmonary disease, unspecified: Secondary | ICD-10-CM | POA: Diagnosis not present

## 2020-01-12 DIAGNOSIS — K219 Gastro-esophageal reflux disease without esophagitis: Secondary | ICD-10-CM | POA: Diagnosis not present

## 2020-01-12 DIAGNOSIS — I10 Essential (primary) hypertension: Secondary | ICD-10-CM | POA: Diagnosis not present

## 2020-01-13 ENCOUNTER — Ambulatory Visit: Payer: Medicare Other | Admitting: Cardiology

## 2020-02-10 DIAGNOSIS — G4730 Sleep apnea, unspecified: Secondary | ICD-10-CM | POA: Diagnosis not present

## 2020-02-29 DIAGNOSIS — M1991 Primary osteoarthritis, unspecified site: Secondary | ICD-10-CM | POA: Diagnosis not present

## 2020-02-29 DIAGNOSIS — Z720 Tobacco use: Secondary | ICD-10-CM | POA: Diagnosis not present

## 2020-02-29 DIAGNOSIS — C9190 Lymphoid leukemia, unspecified not having achieved remission: Secondary | ICD-10-CM | POA: Diagnosis not present

## 2020-02-29 DIAGNOSIS — J449 Chronic obstructive pulmonary disease, unspecified: Secondary | ICD-10-CM | POA: Diagnosis not present

## 2020-03-20 DIAGNOSIS — Z0000 Encounter for general adult medical examination without abnormal findings: Secondary | ICD-10-CM | POA: Diagnosis not present

## 2020-03-20 DIAGNOSIS — F419 Anxiety disorder, unspecified: Secondary | ICD-10-CM | POA: Diagnosis not present

## 2020-03-20 DIAGNOSIS — R5383 Other fatigue: Secondary | ICD-10-CM | POA: Diagnosis not present

## 2020-03-20 DIAGNOSIS — Z6825 Body mass index (BMI) 25.0-25.9, adult: Secondary | ICD-10-CM | POA: Diagnosis not present

## 2020-03-20 DIAGNOSIS — E7849 Other hyperlipidemia: Secondary | ICD-10-CM | POA: Diagnosis not present

## 2020-03-20 DIAGNOSIS — K219 Gastro-esophageal reflux disease without esophagitis: Secondary | ICD-10-CM | POA: Diagnosis not present

## 2020-03-20 DIAGNOSIS — I10 Essential (primary) hypertension: Secondary | ICD-10-CM | POA: Diagnosis not present

## 2020-03-20 DIAGNOSIS — Z1389 Encounter for screening for other disorder: Secondary | ICD-10-CM | POA: Diagnosis not present

## 2020-03-20 DIAGNOSIS — G4733 Obstructive sleep apnea (adult) (pediatric): Secondary | ICD-10-CM | POA: Diagnosis not present

## 2020-03-20 DIAGNOSIS — C9190 Lymphoid leukemia, unspecified not having achieved remission: Secondary | ICD-10-CM | POA: Diagnosis not present

## 2020-03-20 DIAGNOSIS — G894 Chronic pain syndrome: Secondary | ICD-10-CM | POA: Diagnosis not present

## 2020-03-20 DIAGNOSIS — Z125 Encounter for screening for malignant neoplasm of prostate: Secondary | ICD-10-CM | POA: Diagnosis not present

## 2020-03-20 DIAGNOSIS — E663 Overweight: Secondary | ICD-10-CM | POA: Diagnosis not present

## 2020-03-20 DIAGNOSIS — Z1331 Encounter for screening for depression: Secondary | ICD-10-CM | POA: Diagnosis not present

## 2020-03-20 DIAGNOSIS — N486 Induration penis plastica: Secondary | ICD-10-CM | POA: Diagnosis not present

## 2020-04-04 ENCOUNTER — Other Ambulatory Visit: Payer: Self-pay

## 2020-04-04 ENCOUNTER — Inpatient Hospital Stay (HOSPITAL_COMMUNITY): Payer: Medicare Other | Attending: Hematology

## 2020-04-04 DIAGNOSIS — D801 Nonfamilial hypogammaglobulinemia: Secondary | ICD-10-CM | POA: Diagnosis not present

## 2020-04-04 DIAGNOSIS — C9110 Chronic lymphocytic leukemia of B-cell type not having achieved remission: Secondary | ICD-10-CM | POA: Diagnosis not present

## 2020-04-04 DIAGNOSIS — F17210 Nicotine dependence, cigarettes, uncomplicated: Secondary | ICD-10-CM | POA: Diagnosis not present

## 2020-04-04 DIAGNOSIS — Z79899 Other long term (current) drug therapy: Secondary | ICD-10-CM | POA: Insufficient documentation

## 2020-04-04 LAB — CBC WITH DIFFERENTIAL/PLATELET
Basophils Absolute: 0 10*3/uL (ref 0.0–0.1)
Basophils Relative: 0 %
Eosinophils Absolute: 0 10*3/uL (ref 0.0–0.5)
Eosinophils Relative: 0 %
HCT: 46.7 % (ref 39.0–52.0)
Hemoglobin: 14.5 g/dL (ref 13.0–17.0)
Lymphocytes Relative: 96 %
Lymphs Abs: 137.8 10*3/uL — ABNORMAL HIGH (ref 0.7–4.0)
MCH: 30.4 pg (ref 26.0–34.0)
MCHC: 31 g/dL (ref 30.0–36.0)
MCV: 97.9 fL (ref 80.0–100.0)
Monocytes Absolute: 1.4 10*3/uL — ABNORMAL HIGH (ref 0.1–1.0)
Monocytes Relative: 1 %
Neutro Abs: 4.3 10*3/uL (ref 1.7–7.7)
Neutrophils Relative %: 3 %
Platelets: 122 10*3/uL — ABNORMAL LOW (ref 150–400)
RBC: 4.77 MIL/uL (ref 4.22–5.81)
RDW: 13.9 % (ref 11.5–15.5)
WBC: 143.5 10*3/uL (ref 4.0–10.5)
nRBC: 0 % (ref 0.0–0.2)

## 2020-04-04 LAB — COMPREHENSIVE METABOLIC PANEL
ALT: 16 U/L (ref 0–44)
AST: 23 U/L (ref 15–41)
Albumin: 4.5 g/dL (ref 3.5–5.0)
Alkaline Phosphatase: 124 U/L (ref 38–126)
Anion gap: 8 (ref 5–15)
BUN: 19 mg/dL (ref 8–23)
CO2: 26 mmol/L (ref 22–32)
Calcium: 9.5 mg/dL (ref 8.9–10.3)
Chloride: 107 mmol/L (ref 98–111)
Creatinine, Ser: 1.04 mg/dL (ref 0.61–1.24)
GFR, Estimated: >60 mL/min (ref >60)
Glucose, Bld: 101 mg/dL — ABNORMAL HIGH (ref 70–99)
Potassium: 4.9 mmol/L (ref 3.5–5.1)
Sodium: 141 mmol/L (ref 135–145)
Total Bilirubin: 0.9 mg/dL (ref 0.3–1.2)
Total Protein: 6.8 g/dL (ref 6.5–8.1)

## 2020-04-04 LAB — LACTATE DEHYDROGENASE: LDH: 163 U/L (ref 98–192)

## 2020-04-05 LAB — IGG, IGA, IGM
IgA: 30 mg/dL — ABNORMAL LOW (ref 61–437)
IgG (Immunoglobin G), Serum: 465 mg/dL — ABNORMAL LOW (ref 603–1613)
IgM (Immunoglobulin M), Srm: 14 mg/dL — ABNORMAL LOW (ref 20–172)

## 2020-04-10 DIAGNOSIS — M65352 Trigger finger, left little finger: Secondary | ICD-10-CM | POA: Diagnosis not present

## 2020-04-11 ENCOUNTER — Encounter (HOSPITAL_COMMUNITY): Payer: Self-pay | Admitting: Hematology

## 2020-04-11 ENCOUNTER — Other Ambulatory Visit: Payer: Self-pay

## 2020-04-11 ENCOUNTER — Inpatient Hospital Stay (HOSPITAL_BASED_OUTPATIENT_CLINIC_OR_DEPARTMENT_OTHER): Payer: Medicare Other | Admitting: Hematology

## 2020-04-11 VITALS — BP 120/59 | HR 73 | Temp 97.1°F | Resp 18 | Wt 185.6 lb

## 2020-04-11 DIAGNOSIS — C9110 Chronic lymphocytic leukemia of B-cell type not having achieved remission: Secondary | ICD-10-CM

## 2020-04-11 DIAGNOSIS — D801 Nonfamilial hypogammaglobulinemia: Secondary | ICD-10-CM | POA: Diagnosis not present

## 2020-04-11 DIAGNOSIS — Z79899 Other long term (current) drug therapy: Secondary | ICD-10-CM | POA: Diagnosis not present

## 2020-04-11 DIAGNOSIS — F17210 Nicotine dependence, cigarettes, uncomplicated: Secondary | ICD-10-CM | POA: Diagnosis not present

## 2020-04-11 NOTE — Progress Notes (Signed)
Alfred Brown, [place] [address]   CLINIC:  Medical Oncology/Hematology  PCP:  [facility], [place] / [place] [address]  [phone]  REASON FOR VISIT:  Follow-up for CLL  PRIOR THERAPY: None  CURRENT THERAPY: Intermittent IVIG last on 06/03/2019  INTERVAL HISTORY:  [name], a 70 y.o. male, returns for routine follow-up for his CLL. Alfred Brown was last seen on 11/30/2019.  Today he is accompanied by his wife and he reports feeling well. He reports that he had COVID pneumonia in February 2021 and several sinus infections before and after. He keeps track of his multiple lymph nodes in numerous areas.   REVIEW OF SYSTEMS:  Review of Systems  Constitutional: Positive for fatigue (60%). Negative for appetite change.  Respiratory: Positive for shortness of breath (d/t Tx for SVT).   Genitourinary: Positive for hematuria.   Hematological: Bruises/bleeds easily (easy bruising).  All other systems reviewed and are negative.   PAST MEDICAL/SURGICAL HISTORY:  Past Medical History:  Diagnosis Date  . Aortic valve disorder    Mild insufficiency  . Chronic lymphocytic leukemia ([place])    01/2012: WBC-90.2, H&H-15.1/45.3, platelets-185 03/31/12: Verified with Dr. Tressie Stalker that no precautions or modification of medical regime are required prior to orthopaedic surgery.   . CLL (chronic lymphocytic leukemia) ([place])   . CMV (cytomegalovirus) ([place])   . COPD (chronic obstructive pulmonary disease) ([place])   . Depression with anxiety   . DJD (degenerative joint disease)   . GERD (gastroesophageal reflux disease)   . Hyperlipidemia   . Hypogammaglobulinemia ([place]) 09/23/2019  . Lipoma    left chest wall  . Palpitations 2004   PVCs; borderline stress nuclear in 2004, negative in 2007; Echo 2007; AV-sclerotic, very mild AI  . Pneumonia   . Right bundle branch block    + left posterior fascicular block  . Tobacco abuse    80 pack  years   Past Surgical History:  Procedure Laterality Date  . COLONOSCOPY  01/2012   Negative screening study  . GANGLION CYST EXCISION  1997   left wrist  . ROTATOR CUFF REPAIR  1997   right  . SHOULDER ARTHROSCOPY WITH SUBACROMIAL DECOMPRESSION  04/09/2012   Procedure: SHOULDER ARTHROSCOPY WITH SUBACROMIAL DECOMPRESSION;  Surgeon: Ninetta Lights, MD;  Location: [facility];  Service: Orthopedics;  Laterality: Left;  LEFT SHOULDER ARTHROSCOPY, SUBACROMIAL DECOMPRESSION, PARTIAL ACROMIOPLASTY WITH CORACOACROMIAL RELEASE, DISTAL CLAVICULECTOMY WITH ROTATOR CUFF REPAIR, DEBRIDEMENT OF LABRUM    SOCIAL HISTORY:  Social History   Socioeconomic History  . Marital status: Married    Spouse name: Not on file  . Number of children: Not on file  . Years of education: Not on file  . Highest education level: Not on file  Occupational History  . Not on file  Tobacco Use  . Smoking status: Current Every Day Smoker    Packs/day: 1.00    Years: 45.00    Pack years: 45.00    Types: Cigarettes  . Smokeless tobacco: Never Used  Substance and Sexual Activity  . Alcohol use: No    Alcohol/week: 0.0 standard drinks  . Drug use: No  . Sexual activity: Not on file  Other Topics Concern  . Not on file  Social History Narrative  . Not on file   Social Determinants of Health   Financial Resource Strain: Not on file  Food Insecurity: Not on file  Transportation Needs: Not on file  Physical Activity: Not on file  Stress: Not on file  Social Connections: Not on file  Intimate Partner Violence: Not on file    FAMILY HISTORY:  Family History  Problem Relation Age of Onset  . Stroke Mother   . Heart attack Father   . Cancer Sister        colon  . Colon cancer Sister   . Cancer Brother        2 brothers died with lung cancer    CURRENT MEDICATIONS:  Current Outpatient Medications  Medication Sig Dispense Refill  . albuterol (PROVENTIL HFA;VENTOLIN HFA) 108 (90 BASE)  MCG/ACT inhaler Inhale 2 puffs into the lungs every 6 (six) hours as needed for wheezing or shortness of breath.     . ALPRAZolam (XANAX) 1 MG tablet Take 1 mg by mouth 3 (three) times daily as needed for anxiety.     [name] aspirin 325 MG tablet Take 325 mg by mouth daily.      [name] buPROPion (WELLBUTRIN SR) 150 MG 12 hr tablet Take 150 mg by mouth daily.     . calcium carbonate (OS-CAL) 600 MG TABS Take 600 mg by mouth daily.      . celecoxib (CELEBREX) 100 MG capsule Take 100 mg by mouth 2 (two) times daily.    . clotrimazole-betamethasone (LOTRISONE) cream Apply 1 application topically daily as needed. Affected area    . diltiazem (CARDIZEM) 30 MG tablet Take 30 mg by mouth as needed.    . fish oil-omega-3 fatty acids 1000 MG capsule Take 1 g by mouth daily.     . fluticasone (FLONASE) 50 MCG/ACT nasal spray Place 2 sprays into the nose daily.      [name] HYDROcodone-acetaminophen (NORCO/VICODIN) 5-325 MG per tablet Take 1 tablet by mouth every 6 (six) hours as needed for moderate pain.     . Loratadine 10 MG CAPS Take 10 mg by mouth daily.     [name] lovastatin (MEVACOR) 20 MG tablet Take 20 mg by mouth daily.    . metoprolol tartrate (LOPRESSOR) 25 MG tablet Take 12.5 mg by mouth 2 (two) times daily.    . Multiple Vitamin (MULTIVITAMIN) tablet Take 1 tablet by mouth daily.      . niacin (NIASPAN) 500 MG CR tablet Take 500 mg by mouth daily.    . valACYclovir (VALTREX) 500 MG tablet Take 500 mg by mouth daily.     No current facility-administered medications for this visit.    ALLERGIES:  No Known Allergies  PHYSICAL EXAM:  Performance status (ECOG): 1 - Symptomatic but completely ambulatory  Vitals:   04/11/20 1114  BP: (!) 120/59  Pulse: 73  Resp: 18  Temp: (!) 97.1 F (36.2 C)  SpO2: 99%   Wt Readings from Last 3 Encounters:  04/11/20 185 lb 9.6 oz (84.2 kg)  11/30/19 180 lb 8.9 oz (81.9 kg)  11/24/19 178 lb (80.7 kg)   Physical Exam Vitals reviewed.  Constitutional:       Appearance: Normal appearance.  Cardiovascular:     Rate and Rhythm: Normal rate and regular rhythm.     Pulses: Normal pulses.     Heart sounds: Normal heart sounds.  Pulmonary:     Effort: Pulmonary effort is normal.     Breath sounds: Normal breath sounds.  Chest:  Breasts:     Right: Axillary adenopathy (sub-cm LN's) present.     Left: Axillary adenopathy (sub-cm LN's) present.    Abdominal:     Palpations:  Abdomen is soft. There is splenomegaly (tip palpable on deep inspiration). There is no hepatomegaly or mass.     Tenderness: There is no abdominal tenderness.     Hernia: No hernia is present.  Lymphadenopathy:     Cervical: Cervical adenopathy present.     Right cervical: Posterior cervical adenopathy (sub-cm LN's) present.     Left cervical: Posterior cervical adenopathy (sub-cm LN's) present.     Upper Body:     Right upper body: Axillary adenopathy (sub-cm LN's) present.     Left upper body: Axillary adenopathy (sub-cm LN's) present.     Lower Body: Right inguinal adenopathy (sub-cm LN's) present. Left inguinal adenopathy (sub-cm LN's) present.  Neurological:     General: No focal deficit present.     Mental Status: He is alert and oriented to person, place, and time.  Psychiatric:        Mood and Affect: Mood normal.        Behavior: Behavior normal.     LABORATORY DATA:  I have reviewed the labs as listed.  CBC Latest Ref Rng & Units 04/04/2020 11/23/2019 09/16/2019  WBC 4.0 - 10.5 K/uL 143.5(HH) 119.6(HH) 92.8(HH)  Hemoglobin 13.0 - 17.0 g/dL 14.5 13.8 13.9  Hematocrit 39.0 - 52.0 % 46.7 45.2 45.6  Platelets 150 - 400 K/uL 122(L) 122(L) 130(L)   CMP Latest Ref Rng & Units 04/04/2020 11/23/2019 09/16/2019  Glucose 70 - 99 mg/dL 101(H) 108(H) 102(H)  BUN 8 - 23 mg/dL 19 22 20   Creatinine 0.61 - 1.24 mg/dL 1.04 1.07 0.92  Sodium 135 - 145 mmol/L 141 140 140  Potassium 3.5 - 5.1 mmol/L 4.9 4.1 4.7  Chloride 98 - 111 mmol/L 107 106 106  CO2 22 - 32 mmol/L 26 26 28    Calcium 8.9 - 10.3 mg/dL 9.5 8.9 9.1  Total Protein 6.5 - 8.1 g/dL 6.8 6.6 6.5  Total Bilirubin 0.3 - 1.2 mg/dL 0.9 0.7 0.5  Alkaline Phos 38 - 126 U/L 124 116 123  AST 15 - 41 U/L 23 21 21   ALT 0 - 44 U/L 16 16 16       Component Value Date/Time   RBC 4.77 04/04/2020 1144   MCV 97.9 04/04/2020 1144   MCH 30.4 04/04/2020 1144   MCHC 31.0 04/04/2020 1144   RDW 13.9 04/04/2020 1144   LYMPHSABS 137.8 (H) 04/04/2020 1144   MONOABS 1.4 (H) 04/04/2020 1144   EOSABS 0.0 04/04/2020 1144   BASOSABS 0.0 04/04/2020 1144   Lab Results  Component Value Date   IGGSERUM 465 (L) 04/04/2020   IGGSERUM 512 (L) 11/23/2019   IGGSERUM 544 (L) 09/16/2019   IGA 30 (L) 04/04/2020   IGA 35 (L) 11/23/2019   IGA 38 (L) 09/16/2019   IGMSERUM 14 (L) 04/04/2020   IGMSERUM 28 11/23/2019   IGMSERUM 28 09/16/2019    Lab Results  Component Value Date   LDH 163 04/04/2020   LDH 142 11/23/2019   LDH 139 09/16/2019    DIAGNOSTIC IMAGING:  I have independently reviewed the scans and discussed with the patient. No results found.   ASSESSMENT:  1. Stage II CLL by Rai system: -Diagnosed around 2010, observation since then. -Denies any fevers, night sweats or weight loss in the last 6 months. -Patient was diagnosed with COVID-19 on 05/09/2019. He was also admitted in March for IVIG infusion.   2. Hypogammaglobulinemia: -This is from underlying CLL. Trough immunoglobulins are low at 3 50-400. He did not have any recurrent infections. -However because  of his recent COVID-19 infection, he received IVIG 400 mg/kg 1 dose on 06/03/2019.  3. Shortness of breath on exertion: -he reports shortness of breath on exertion which has not improved since recent hospitalization. -I have done chest x-ray in the office today and reviewed it and compare it from chest x-ray from June 02, 2019. Infiltrates have slightly improved.   PLAN:  1. Stage II CLL by Rai system: -No fevers, night sweats or weight loss in  the last 4 months. - Physical examination shows subcentimeter lymph nodes in the posterior neck, bilateral axillary and groin region stable.  Spleen tip palpable. - Reviewed labs from 04/04/2020.  White count increased to 143.  Hemoglobin is normal.  Platelets are 122 and stable.  LDH was normal. -No indication for treatment at this time.  Recommend follow-up in 4 months with repeat labs.  2. Hypogammaglobulinemia: -Quantitative immunoglobulins are 465 on 04/04/2020. - Had 2 episodes of sinus infection in September and October. - We will closely monitor.  If there is any increased incidence of infections this year, will consider immunoglobulin therapy.  Orders placed this encounter:  No orders of the defined types were placed in this encounter.    Derek Jack, MD [place] [phone]   I, Milinda Antis, am acting as a scribe for Dr. Sanda Linger.  I, Derek Jack MD, have reviewed the above documentation for accuracy and completeness, and I agree with the above.

## 2020-04-11 NOTE — Patient Instructions (Signed)
[place] at [facility] Discharge Instructions  You were seen today by Dr. Delton Coombes. He went over your recent results. Starting with the new year, keep a diary of your infections so that it can be reviewed by the doctor at your next visit. Dr. Delton Coombes will see you back in 4 months for labs and follow up.   Thank you for choosing [place] at [facility] to provide your oncology and hematology care.  To afford each patient quality time with our provider, please arrive at least 15 minutes before your scheduled appointment time.   If you have a lab appointment with the [facility] please come in thru the Main Entrance and check in at the main information desk  You need to re-schedule your appointment should you arrive 10 or more minutes late.  We strive to give you quality time with our providers, and arriving late affects you and other patients whose appointments are after yours.  Also, if you no show three or more times for appointments you may be dismissed from the clinic at the providers discretion.     Again, thank you for choosing [facility]  Our hope is that these requests will decrease the amount of time that you wait before being seen by our physicians.       _____________________________________________________________  Should you have questions after your visit to [facility], please contact our office at [phone] between the hours of 8:00 a.m. and 4:30 p.m.  Voicemails left after 4:00 p.m. will not be returned until the following business day.  For prescription refill requests, have your pharmacy contact our office and allow 72 hours.    Cancer Center Support Programs:   > Cancer Support Group  2nd Tuesday of the month 1pm-2pm, Journey Room

## 2020-04-20 ENCOUNTER — Ambulatory Visit (INDEPENDENT_AMBULATORY_CARE_PROVIDER_SITE_OTHER): Payer: Medicare Other | Admitting: Cardiology

## 2020-04-20 ENCOUNTER — Encounter: Payer: Self-pay | Admitting: Cardiology

## 2020-04-20 VITALS — BP 150/80 | HR 86 | Ht 73.0 in | Wt 182.6 lb

## 2020-04-20 DIAGNOSIS — R002 Palpitations: Secondary | ICD-10-CM

## 2020-04-20 DIAGNOSIS — I350 Nonrheumatic aortic (valve) stenosis: Secondary | ICD-10-CM

## 2020-04-20 MED ORDER — METOPROLOL TARTRATE 25 MG PO TABS: 12.5000 mg | ORAL_TABLET | Freq: Two times a day (BID) | ORAL | 1 refills | Status: DC

## 2020-04-20 NOTE — Progress Notes (Signed)
Clinical Summary Mr. Eyer is a 70 y.o.male seen today for follow up of the following medical problems.   1. Papitations - long history of symtpoms. -2017 monitor with just PACs  - noted from tele lead during 07/2019 echo, rates 130s narrow complex regular - 07/2019 7 day event monitor without signficant arrhythmias   - some ongoign palpitations, infrequent and overall mild    2.PossibleBicuspid aortic valve/ Aortic stenosis - somewhat questionable on prior imaging - CTA chest 12/2004 without significant aortopathy. - 2019 echo LVEF 60-65%, mild to mod AS (mean grad 17, AVA VTI 1.4)   07/2019 echo: LVEF 55-60%, mild to mod AS - no symptoms  3. CLL - followed at cancer center  4. COVID 19 - diagnosed in 05/2019, received outpatient monoclonal Ab - has not been vaccinated.   5. COPD - admit 05/2019 with COPD exacerbation, pneumonia   6. AAA screen - had prior CT 12/2015 A/P for flank pain, no aneurysm noted  Home bp's 120s/70s  Past Medical History:  Diagnosis Date  . Aortic valve disorder    Mild insufficiency  . Chronic lymphocytic leukemia ([place])    01/2012: WBC-90.2, H&H-15.1/45.3, platelets-185 03/31/12: Verified with Dr. Tressie Stalker that no precautions or modification of medical regime are required prior to orthopaedic surgery.   . CLL (chronic lymphocytic leukemia) ([place])   . CMV (cytomegalovirus) ([place])   . COPD (chronic obstructive pulmonary disease) ([place])   . Depression with anxiety   . DJD (degenerative joint disease)   . GERD (gastroesophageal reflux disease)   . Hyperlipidemia   . Hypogammaglobulinemia ([place]) 09/23/2019  . Lipoma    left chest wall  . Palpitations 2004   PVCs; borderline stress nuclear in 2004, negative in 2007; Echo 2007; AV-sclerotic, very mild AI  . Pneumonia   . Right bundle Travone Georg block    + left posterior fascicular block  . Tobacco abuse    80 pack years     No Known Allergies   Current Outpatient  Medications  Medication Sig Dispense Refill  . albuterol (PROVENTIL HFA;VENTOLIN HFA) 108 (90 BASE) MCG/ACT inhaler Inhale 2 puffs into the lungs every 6 (six) hours as needed for wheezing or shortness of breath.     . ALPRAZolam (XANAX) 1 MG tablet Take 1 mg by mouth 3 (three) times daily as needed for anxiety.     [name] aspirin 325 MG tablet Take 325 mg by mouth daily.      [name] buPROPion (WELLBUTRIN SR) 150 MG 12 hr tablet Take 150 mg by mouth daily.     . calcium carbonate (OS-CAL) 600 MG TABS Take 600 mg by mouth daily.      . celecoxib (CELEBREX) 100 MG capsule Take 100 mg by mouth 2 (two) times daily.    . clotrimazole-betamethasone (LOTRISONE) cream Apply 1 application topically daily as needed. Affected area    . diltiazem (CARDIZEM) 30 MG tablet Take 30 mg by mouth as needed.    . fish oil-omega-3 fatty acids 1000 MG capsule Take 1 g by mouth daily.     . fluticasone (FLONASE) 50 MCG/ACT nasal spray Place 2 sprays into the nose daily.      [name] HYDROcodone-acetaminophen (NORCO/VICODIN) 5-325 MG per tablet Take 1 tablet by mouth every 6 (six) hours as needed for moderate pain.     . Loratadine 10 MG CAPS Take 10 mg by mouth daily.     [name] lovastatin (MEVACOR) 20 MG tablet Take 20 mg by mouth daily.    [name]  metoprolol tartrate (LOPRESSOR) 25 MG tablet Take 12.5 mg by mouth 2 (two) times daily.    . Multiple Vitamin (MULTIVITAMIN) tablet Take 1 tablet by mouth daily.      . niacin (NIASPAN) 500 MG CR tablet Take 500 mg by mouth daily.    . valACYclovir (VALTREX) 500 MG tablet Take 500 mg by mouth daily.     No current facility-administered medications for this visit.     Past Surgical History:  Procedure Laterality Date  . COLONOSCOPY  01/2012   Negative screening study  . GANGLION CYST EXCISION  1997   left wrist  . ROTATOR CUFF REPAIR  1997   right  . SHOULDER ARTHROSCOPY WITH SUBACROMIAL DECOMPRESSION  04/09/2012   Procedure: SHOULDER ARTHROSCOPY WITH SUBACROMIAL DECOMPRESSION;  Surgeon:  Ninetta Lights, MD;  Location: [facility];  Service: Orthopedics;  Laterality: Left;  LEFT SHOULDER ARTHROSCOPY, SUBACROMIAL DECOMPRESSION, PARTIAL ACROMIOPLASTY WITH CORACOACROMIAL RELEASE, DISTAL CLAVICULECTOMY WITH ROTATOR CUFF REPAIR, DEBRIDEMENT OF LABRUM     No Known Allergies    Family History  Problem Relation Age of Onset  . Stroke Mother   . Heart attack Father   . Cancer Sister        colon  . Colon cancer Sister   . Cancer Brother        2 brothers died with lung cancer     Social History Mr. Stamour reports that he has been smoking cigarettes. He has a 45.00 pack-year smoking history. He has never used smokeless tobacco. Mr. Rivere reports no history of alcohol use.   Review of Systems CONSTITUTIONAL: No weight loss, fever, chills, weakness or fatigue.  HEENT: Eyes: No visual loss, blurred vision, double vision or yellow sclerae.No hearing loss, sneezing, congestion, runny nose or sore throat.  SKIN: No rash or itching.  CARDIOVASCULAR: per hpi RESPIRATORY: No shortness of breath, cough or sputum.  GASTROINTESTINAL: No anorexia, nausea, vomiting or diarrhea. No abdominal pain or blood.  GENITOURINARY: No burning on urination, no polyuria NEUROLOGICAL: No headache, dizziness, syncope, paralysis, ataxia, numbness or tingling in the extremities. No change in bowel or bladder control.  MUSCULOSKELETAL: No muscle, back pain, joint pain or stiffness.  LYMPHATICS: No enlarged nodes. No history of splenectomy.  PSYCHIATRIC: No history of depression or anxiety.  ENDOCRINOLOGIC: No reports of sweating, cold or heat intolerance. No polyuria or polydipsia.  [name]   Physical Examination Today's Vitals   04/20/20 1001  BP: (!) 150/80  Pulse: 86  SpO2: 98%  Weight: 182 lb 9.6 oz (82.8 kg)  Height: 6\' 1"  (1.854 m)   Body mass index is 24.09 kg/m.  Gen: resting comfortably, no acute distress HEENT: no scleral icterus, pupils equal round and reactive, no  palptable cervical adenopathy,  CV: RRR, no m/r/g, no jvd Resp: Clear to auscultation bilaterally GI: abdomen is soft, non-tender, non-distended, normal bowel sounds, no hepatosplenomegaly MSK: extremities are warm, no edema.  Skin: warm, no rash Neuro:  no focal deficits Psych: appropriate affect   Diagnostic Studies Jan 2021 carotid US IMPRESSION: 1. Bilateral carotid bifurcation plaque resulting in less than 50% diameter ICA stenosis. 2. Antegrade bilateral vertebral arterial flow.  07/2019 echo IMPRESSIONS    1. Left ventricular ejection fraction, by estimation, is 55 to 60%. The  left ventricle has normal function. The left ventricle has no regional  wall motion abnormalities. Left ventricular diastolic parameters are  indeterminate.  2. Tachycardia with heart rate 135 noted near beginning of study. Same  QRS morphology  and no obvious P waves - question SVT, possibly atrial  flutter.  3. Right ventricular systolic function is normal. The right ventricular  size is normal. Tricuspid regurgitation signal is inadequate for assessing  PA pressure.  4. The mitral valve is grossly normal, mild annular calcification.  Trivial mitral valve regurgitation.  5. The aortic valve is likely tricuspid, moderately calcified and not  well visualized. Cusp excursion is limited. Aortic valve regurgitation is  mild. Aortic valve mean gradient measures 12.0 mmHg. Appears that LVOT VTI  may be overestimated and the peak  AV velocity is higher than that recorded. Cannot be conclusive about  degree of aortic stenosis, but suspect moderate. Consider limited study  with further interrogation, particularly if heart rate and rhythm are in  better control.     Assessment and Plan  1. Palpitaitons -previous monitor with just occasional PACs, no significant arrhythmias -symptoms doing well on low dose beta blocker, continue   2. Aortic stenosis -mild to mod AS, continue to  monitor       Arnoldo Lenis, M.D.

## 2020-04-20 NOTE — Patient Instructions (Signed)
Your physician recommends that you schedule a follow-up appointment in: 6 MONTHS WITH DR BRANCH  Your physician recommends that you continue on your current medications as directed. Please refer to the Current Medication list given to you today.  Thank you for choosing [facility]!!    

## 2020-06-28 DIAGNOSIS — J449 Chronic obstructive pulmonary disease, unspecified: Secondary | ICD-10-CM | POA: Diagnosis not present

## 2020-06-28 DIAGNOSIS — E7849 Other hyperlipidemia: Secondary | ICD-10-CM | POA: Diagnosis not present

## 2020-06-28 DIAGNOSIS — Z720 Tobacco use: Secondary | ICD-10-CM | POA: Diagnosis not present

## 2020-07-24 DIAGNOSIS — M25511 Pain in right shoulder: Secondary | ICD-10-CM | POA: Diagnosis not present

## 2020-07-27 DIAGNOSIS — H01001 Unspecified blepharitis right upper eyelid: Secondary | ICD-10-CM | POA: Diagnosis not present

## 2020-08-07 DIAGNOSIS — M542 Cervicalgia: Secondary | ICD-10-CM | POA: Diagnosis not present

## 2020-08-07 DIAGNOSIS — M25512 Pain in left shoulder: Secondary | ICD-10-CM | POA: Diagnosis not present

## 2020-08-14 DIAGNOSIS — I350 Nonrheumatic aortic (valve) stenosis: Secondary | ICD-10-CM | POA: Diagnosis not present

## 2020-08-14 DIAGNOSIS — Z6824 Body mass index (BMI) 24.0-24.9, adult: Secondary | ICD-10-CM | POA: Diagnosis not present

## 2020-08-14 DIAGNOSIS — M5412 Radiculopathy, cervical region: Secondary | ICD-10-CM | POA: Diagnosis not present

## 2020-08-14 DIAGNOSIS — J449 Chronic obstructive pulmonary disease, unspecified: Secondary | ICD-10-CM | POA: Diagnosis not present

## 2020-08-14 DIAGNOSIS — M4312 Spondylolisthesis, cervical region: Secondary | ICD-10-CM | POA: Diagnosis not present

## 2020-08-14 DIAGNOSIS — C9190 Lymphoid leukemia, unspecified not having achieved remission: Secondary | ICD-10-CM | POA: Diagnosis not present

## 2020-08-14 DIAGNOSIS — M47812 Spondylosis without myelopathy or radiculopathy, cervical region: Secondary | ICD-10-CM | POA: Diagnosis not present

## 2020-08-16 ENCOUNTER — Other Ambulatory Visit (HOSPITAL_COMMUNITY): Payer: Self-pay | Admitting: Internal Medicine

## 2020-08-16 ENCOUNTER — Other Ambulatory Visit: Payer: Self-pay | Admitting: Internal Medicine

## 2020-08-16 DIAGNOSIS — M5412 Radiculopathy, cervical region: Secondary | ICD-10-CM

## 2020-08-17 ENCOUNTER — Inpatient Hospital Stay (HOSPITAL_COMMUNITY): Payer: Medicare Other | Attending: Hematology

## 2020-08-17 ENCOUNTER — Other Ambulatory Visit: Payer: Self-pay

## 2020-08-17 DIAGNOSIS — Z800 Family history of malignant neoplasm of digestive organs: Secondary | ICD-10-CM | POA: Diagnosis not present

## 2020-08-17 DIAGNOSIS — C9110 Chronic lymphocytic leukemia of B-cell type not having achieved remission: Secondary | ICD-10-CM

## 2020-08-17 DIAGNOSIS — Z801 Family history of malignant neoplasm of trachea, bronchus and lung: Secondary | ICD-10-CM | POA: Diagnosis not present

## 2020-08-17 DIAGNOSIS — D801 Nonfamilial hypogammaglobulinemia: Secondary | ICD-10-CM | POA: Insufficient documentation

## 2020-08-17 DIAGNOSIS — R0602 Shortness of breath: Secondary | ICD-10-CM | POA: Insufficient documentation

## 2020-08-17 DIAGNOSIS — M25519 Pain in unspecified shoulder: Secondary | ICD-10-CM | POA: Diagnosis not present

## 2020-08-17 DIAGNOSIS — Z8616 Personal history of COVID-19: Secondary | ICD-10-CM | POA: Insufficient documentation

## 2020-08-17 DIAGNOSIS — F17210 Nicotine dependence, cigarettes, uncomplicated: Secondary | ICD-10-CM | POA: Diagnosis not present

## 2020-08-17 DIAGNOSIS — M542 Cervicalgia: Secondary | ICD-10-CM | POA: Insufficient documentation

## 2020-08-17 LAB — CBC WITH DIFFERENTIAL/PLATELET
Abs Immature Granulocytes: 1.29 10*3/uL — ABNORMAL HIGH (ref 0.00–0.07)
Basophils Absolute: 0.1 10*3/uL (ref 0.0–0.1)
Basophils Relative: 0 %
Eosinophils Absolute: 0.1 10*3/uL (ref 0.0–0.5)
Eosinophils Relative: 0 %
HCT: 49.4 % (ref 39.0–52.0)
Hemoglobin: 14 g/dL (ref 13.0–17.0)
Immature Granulocytes: 0 %
Lymphocytes Relative: 95 %
Lymphs Abs: 284.8 10*3/uL — ABNORMAL HIGH (ref 0.7–4.0)
MCH: 28.7 pg (ref 26.0–34.0)
MCHC: 28.3 g/dL — ABNORMAL LOW (ref 30.0–36.0)
MCV: 101.2 fL — ABNORMAL HIGH (ref 80.0–100.0)
Monocytes Absolute: 4 10*3/uL — ABNORMAL HIGH (ref 0.1–1.0)
Monocytes Relative: 1 %
Neutro Abs: 12.7 10*3/uL — ABNORMAL HIGH (ref 1.7–7.7)
Neutrophils Relative %: 4 %
Platelets: 158 10*3/uL (ref 150–400)
RBC: 4.88 MIL/uL (ref 4.22–5.81)
RDW: 17.6 % — ABNORMAL HIGH (ref 11.5–15.5)
WBC: 303 10*3/uL (ref 4.0–10.5)
nRBC: 0 % (ref 0.0–0.2)

## 2020-08-17 LAB — COMPREHENSIVE METABOLIC PANEL
ALT: 22 U/L (ref 0–44)
AST: 21 U/L (ref 15–41)
Albumin: 4.3 g/dL (ref 3.5–5.0)
Alkaline Phosphatase: 106 U/L (ref 38–126)
Anion gap: 8 (ref 5–15)
BUN: 27 mg/dL — ABNORMAL HIGH (ref 8–23)
CO2: 26 mmol/L (ref 22–32)
Calcium: 9.1 mg/dL (ref 8.9–10.3)
Chloride: 106 mmol/L (ref 98–111)
Creatinine, Ser: 1.06 mg/dL (ref 0.61–1.24)
GFR, Estimated: >60 mL/min (ref >60)
Glucose, Bld: 105 mg/dL — ABNORMAL HIGH (ref 70–99)
Potassium: 4.6 mmol/L (ref 3.5–5.1)
Sodium: 140 mmol/L (ref 135–145)
Total Bilirubin: 1 mg/dL (ref 0.3–1.2)
Total Protein: 6.6 g/dL (ref 6.5–8.1)

## 2020-08-17 LAB — URIC ACID: Uric Acid, Serum: 3.4 mg/dL — ABNORMAL LOW (ref 3.7–8.6)

## 2020-08-17 LAB — LACTATE DEHYDROGENASE: LDH: 134 U/L (ref 98–192)

## 2020-08-17 NOTE — Progress Notes (Signed)
CRITICAL VALUE STICKER  CRITICAL VALUE:  WBC 303  RECEIVER (on-site recipient of call):  A. Ouida Sills, RN  DATE & TIME NOTIFIED: 08/17/2020 at 1145  MD NOTIFIED: Delton Coombes  RESPONSE: f/u as scheduled on 08/24/2020

## 2020-08-18 LAB — IGG, IGA, IGM
IgA: 29 mg/dL — ABNORMAL LOW (ref 61–437)
IgG (Immunoglobin G), Serum: 434 mg/dL — ABNORMAL LOW (ref 603–1613)
IgM (Immunoglobulin M), Srm: 11 mg/dL — ABNORMAL LOW (ref 20–172)

## 2020-08-23 NOTE — Progress Notes (Signed)
Alfred Brown, [place] [id number]   CLINIC:  Medical Oncology/Hematology  PCP:  [facility], [place] / [place] [address] [phone]   REASON FOR VISIT:  Follow-up for CLL  PRIOR THERAPY: none  NGS Results: not done  CURRENT THERAPY:  Intermittent IVIG last on 06/03/2019  BRIEF ONCOLOGIC HISTORY:  Oncology History   No history exists.    CANCER STAGING: Cancer Staging No matching staging information was found for the patient.  INTERVAL HISTORY:  [name], a 70 y.o. male, returns for routine follow-up of his CLL. Alfred Brown was last seen on 04/11/2020.   Today he reports feeling well. He reports neck pain, but denies any recent fevers. He had an upper respiratory infection in January which was treated with doxycycline and prednisone. He had another prednisone dose pack on 05/09 and then again on 05/17 which has given for neck and shoulder pain. He had a shoulder injection on 04/25. He is scheduled for an MRI of his neck omn 06/01. He reports baseline levels of fatigue.   REVIEW OF SYSTEMS:  Review of Systems  Constitutional: Positive for fatigue (50%). Negative for appetite change.  Respiratory: Positive for shortness of breath (exertion).   All other systems reviewed and are negative.   PAST MEDICAL/SURGICAL HISTORY:  Past Medical History:  Diagnosis Date  . Aortic valve disorder    Mild insufficiency  . Chronic lymphocytic leukemia ([facility])    01/2012: WBC-90.2, H&H-15.1/45.3, platelets-185 03/31/12: Verified with Dr. Tressie Stalker that no precautions or modification of medical regime are required prior to orthopaedic surgery.   . CLL (chronic lymphocytic leukemia) ([facility])   . CMV (cytomegalovirus) ([facility])   . COPD (chronic obstructive pulmonary disease) ([facility])   . Depression with anxiety   . DJD (degenerative joint disease)   . GERD (gastroesophageal reflux disease)   . Hyperlipidemia   . Hypogammaglobulinemia  ([facility]) 09/23/2019  . Lipoma    left chest wall  . Palpitations 2004   PVCs; borderline stress nuclear in 2004, negative in 2007; Echo 2007; AV-sclerotic, very mild AI  . Pneumonia   . Right bundle branch block    + left posterior fascicular block  . Tobacco abuse    80 pack years   Past Surgical History:  Procedure Laterality Date  . COLONOSCOPY  01/2012   Negative screening study  . GANGLION CYST EXCISION  1997   left wrist  . ROTATOR CUFF REPAIR  1997   right  . SHOULDER ARTHROSCOPY WITH SUBACROMIAL DECOMPRESSION  04/09/2012   Procedure: SHOULDER ARTHROSCOPY WITH SUBACROMIAL DECOMPRESSION;  Surgeon: Ninetta Lights, MD;  Location: [facility];  Service: Orthopedics;  Laterality: Left;  LEFT SHOULDER ARTHROSCOPY, SUBACROMIAL DECOMPRESSION, PARTIAL ACROMIOPLASTY WITH CORACOACROMIAL RELEASE, DISTAL CLAVICULECTOMY WITH ROTATOR CUFF REPAIR, DEBRIDEMENT OF LABRUM    SOCIAL HISTORY:  Social History   Socioeconomic History  . Marital status: Married    Spouse name: Not on file  . Number of children: Not on file  . Years of education: Not on file  . Highest education level: Not on file  Occupational History  . Not on file  Tobacco Use  . Smoking status: Current Every Day Smoker    Packs/day: 1.00    Years: 45.00    Pack years: 45.00    Types: Cigarettes    Start date: 12/03/1963  . Smokeless tobacco: Never Used  Substance and Sexual Activity  . Alcohol use: No    Alcohol/week:  0.0 standard drinks  . Drug use: No  . Sexual activity: Not on file  Other Topics Concern  . Not on file  Social History Narrative  . Not on file   Social Determinants of Health   Financial Resource Strain: Not on file  Food Insecurity: Not on file  Transportation Needs: Not on file  Physical Activity: Not on file  Stress: Not on file  Social Connections: Not on file  Intimate Partner Violence: Not on file    FAMILY HISTORY:  Family History  Problem Relation Age of Onset  .  Stroke Mother   . Heart attack Father   . Cancer Sister        colon  . Colon cancer Sister   . Cancer Brother        2 brothers died with lung cancer    CURRENT MEDICATIONS:  Current Outpatient Medications  Medication Sig Dispense Refill  . albuterol (PROVENTIL HFA;VENTOLIN HFA) 108 (90 BASE) MCG/ACT inhaler Inhale 2 puffs into the lungs every 6 (six) hours as needed for wheezing or shortness of breath.     . ALPRAZolam (XANAX) 1 MG tablet Take 1 mg by mouth 3 (three) times daily as needed for anxiety.     [name] aspirin 325 MG tablet Take 325 mg by mouth daily.    [name] buPROPion (WELLBUTRIN SR) 150 MG 12 hr tablet Take 150 mg by mouth daily.    . calcium carbonate (OS-CAL) 600 MG TABS Take 600 mg by mouth daily.    . celecoxib (CELEBREX) 100 MG capsule Take 100 mg by mouth 2 (two) times daily.    . clotrimazole-betamethasone (LOTRISONE) cream Apply 1 application topically daily as needed. Affected area    . diltiazem (CARDIZEM) 30 MG tablet Take 30 mg by mouth as needed.    . fish oil-omega-3 fatty acids 1000 MG capsule Take 1 g by mouth daily.    . fluticasone (FLONASE) 50 MCG/ACT nasal spray Place 2 sprays into the nose daily.    [name] HYDROcodone-acetaminophen (NORCO/VICODIN) 5-325 MG per tablet Take 1 tablet by mouth every 6 (six) hours as needed for moderate pain.     . Loratadine 10 MG CAPS Take 10 mg by mouth daily.    [name] lovastatin (MEVACOR) 20 MG tablet Take 20 mg by mouth daily.    . metoprolol tartrate (LOPRESSOR) 25 MG tablet Take 0.5 tablets (12.5 mg total) by mouth 2 (two) times daily. 90 tablet 1  . Multiple Vitamin (MULTIVITAMIN) tablet Take 1 tablet by mouth daily.    . niacin (NIASPAN) 500 MG CR tablet Take 500 mg by mouth daily.    . valACYclovir (VALTREX) 500 MG tablet Take 500 mg by mouth daily.     No current facility-administered medications for this visit.    ALLERGIES:  No Known Allergies  PHYSICAL EXAM:  Performance status (ECOG): 1 - Symptomatic but completely  ambulatory  There were no vitals filed for this visit. Wt Readings from Last 3 Encounters:  04/20/20 182 lb 9.6 oz (82.8 kg)  04/11/20 185 lb 9.6 oz (84.2 kg)  11/30/19 180 lb 8.9 oz (81.9 kg)   Physical Exam Vitals reviewed.  Constitutional:      Appearance: Normal appearance.  Cardiovascular:     Rate and Rhythm: Normal rate and regular rhythm.     Pulses: Normal pulses.     Heart sounds: Normal heart sounds.  Pulmonary:     Effort: Pulmonary effort is normal.     Breath sounds:  Normal breath sounds.  Chest:  Breasts:     Right: Axillary adenopathy (small lymph nodes) present.     Left: No axillary adenopathy.    Abdominal:     Palpations: Abdomen is soft. There is splenomegaly (spleen tip palpable). There is no hepatomegaly or mass.     Tenderness: There is no abdominal tenderness.  Musculoskeletal:     Right lower leg: No edema.     Left lower leg: No edema.  Lymphadenopathy:     Cervical: No cervical adenopathy.     Right cervical: No superficial cervical adenopathy.    Left cervical: No superficial cervical adenopathy.     Upper Body:     Right upper body: Axillary adenopathy (small lymph nodes) present. No pectoral adenopathy.     Left upper body: No axillary or pectoral adenopathy.     Lower Body: Right inguinal adenopathy (small lymph nodes) present. No left inguinal adenopathy.  Neurological:     General: No focal deficit present.     Mental Status: He is alert and oriented to person, place, and time.  Psychiatric:        Mood and Affect: Mood normal.        Behavior: Behavior normal.      LABORATORY DATA:  I have reviewed the labs as listed.  CBC Latest Ref Rng & Units 08/17/2020 04/04/2020 11/23/2019  WBC 4.0 - 10.5 K/uL 303.0(HH) 143.5(HH) 119.6(HH)  Hemoglobin 13.0 - 17.0 g/dL 14.0 14.5 13.8  Hematocrit 39.0 - 52.0 % 49.4 46.7 45.2  Platelets 150 - 400 K/uL 158 122(L) 122(L)   CMP Latest Ref Rng & Units 08/17/2020 04/04/2020 11/23/2019  Glucose 70 - 99  mg/dL 105(H) 101(H) 108(H)  BUN 8 - 23 mg/dL 27(H) 19 22  Creatinine 0.61 - 1.24 mg/dL 1.06 1.04 1.07  Sodium 135 - 145 mmol/L 140 141 140  Potassium 3.5 - 5.1 mmol/L 4.6 4.9 4.1  Chloride 98 - 111 mmol/L 106 107 106  CO2 22 - 32 mmol/L 26 26 26   Calcium 8.9 - 10.3 mg/dL 9.1 9.5 8.9  Total Protein 6.5 - 8.1 g/dL 6.6 6.8 6.6  Total Bilirubin 0.3 - 1.2 mg/dL 1.0 0.9 0.7  Alkaline Phos 38 - 126 U/L 106 124 116  AST 15 - 41 U/L 21 23 21   ALT 0 - 44 U/L 22 16 16     DIAGNOSTIC IMAGING:  I have independently reviewed the scans and discussed with the patient. No results found.   ASSESSMENT:  1. Stage II CLL by Rai system: -Diagnosed around 2010, observation since then. -Denies any fevers, night sweats or weight loss in the last 6 months. -Patient was diagnosed with COVID-19 on 05/09/2019. He was also admitted in March for IVIG infusion.   2. Hypogammaglobulinemia: -This is from underlying CLL. Trough immunoglobulins are low at 3 50-400. He did not have any recurrent infections. -However because of his recent COVID-19 infection, he received IVIG 400 mg/kg 1 dose on 06/03/2019.  3. Shortness of breath on exertion: -he reports shortness of breath on exertion which has not improved since recent hospitalization. -I have done chest x-ray in the office today and reviewed it and compare it from chest x-ray from June 02, 2019. Infiltrates have slightly improved.   PLAN:  1. Stage II CLL by Rai system: -Physical examination today shows subcentimeter lymph nodes in the bilateral axillary regions and inguinal region. - He denies any fevers, night sweats or weight loss in the last 3 to 6 months. -  His white count was 143 in January.  This went up to 303 with normal hemoglobin and platelet count.  Differential shows its predominantly lymphocytes.  LDH was normal. - He reportedly took steroid injections in the right shoulder on 07/24/2020.  He received prednisone Dosepak for 6 days on 08/07/2020  and 08/15/2020. - Elevated white count could be prednisone induced.  He does not have any other signs or symptoms to indicate treatment required. - RTC 3 months with labs.  2. Hypogammaglobulinemia: -He did not have any recurrent infections since last visit. - He has chronically low IgG levels.  Recent labs on 08/17/2020 shows IgG level 434.  No indication for IVIG at this time.   Orders placed this encounter:  No orders of the defined types were placed in this encounter.    Derek Jack, MD [place] [phone]   I, Thana Ates, am acting as a scribe for Dr. Derek Jack.  I, Derek Jack MD, have reviewed the above documentation for accuracy and completeness, and I agree with the above.

## 2020-08-24 ENCOUNTER — Inpatient Hospital Stay (HOSPITAL_BASED_OUTPATIENT_CLINIC_OR_DEPARTMENT_OTHER): Payer: Medicare Other | Admitting: Hematology

## 2020-08-24 ENCOUNTER — Other Ambulatory Visit: Payer: Self-pay

## 2020-08-24 ENCOUNTER — Encounter (HOSPITAL_COMMUNITY): Payer: Self-pay | Admitting: Hematology

## 2020-08-24 VITALS — BP 131/63 | HR 74 | Temp 96.9°F | Resp 18 | Wt 177.2 lb

## 2020-08-24 DIAGNOSIS — C9110 Chronic lymphocytic leukemia of B-cell type not having achieved remission: Secondary | ICD-10-CM

## 2020-08-24 DIAGNOSIS — F17210 Nicotine dependence, cigarettes, uncomplicated: Secondary | ICD-10-CM | POA: Diagnosis not present

## 2020-08-24 DIAGNOSIS — D801 Nonfamilial hypogammaglobulinemia: Secondary | ICD-10-CM | POA: Diagnosis not present

## 2020-08-24 DIAGNOSIS — M25519 Pain in unspecified shoulder: Secondary | ICD-10-CM | POA: Diagnosis not present

## 2020-08-24 DIAGNOSIS — R0602 Shortness of breath: Secondary | ICD-10-CM | POA: Diagnosis not present

## 2020-08-24 DIAGNOSIS — M542 Cervicalgia: Secondary | ICD-10-CM | POA: Diagnosis not present

## 2020-08-24 NOTE — Patient Instructions (Signed)
[facility] at [facility] Discharge Instructions  You were seen today by Dr. Katragadda. He went over your recent results. Dr. Katragadda will see you back in 3 months for labs and follow up.   Thank you for choosing [facility] at [facility] to provide your oncology and hematology care.  To afford each patient quality time with our provider, please arrive at least 15 minutes before your scheduled appointment time.   If you have a lab appointment with the Cancer Center please come in thru the Main Entrance and check in at the main information desk  You need to re-schedule your appointment should you arrive 10 or more minutes late.  We strive to give you quality time with our providers, and arriving late affects you and other patients whose appointments are after yours.  Also, if you no show three or more times for appointments you may be dismissed from the clinic at the providers discretion.     Again, thank you for choosing [facility].  Our hope is that these requests will decrease the amount of time that you wait before being seen by our physicians.       _____________________________________________________________  Should you have questions after your visit to [facility], please contact our office at [phone] between the hours of 8:00 a.m. and 4:30 p.m.  Voicemails left after 4:00 p.m. will not be returned until the following business day.  For prescription refill requests, have your pharmacy contact our office and allow 72 hours.    Cancer Center Support Programs:   > Cancer Support Group  2nd Tuesday of the month 1pm-2pm, Journey Room   

## 2020-08-29 DIAGNOSIS — J449 Chronic obstructive pulmonary disease, unspecified: Secondary | ICD-10-CM | POA: Diagnosis not present

## 2020-08-29 DIAGNOSIS — E7849 Other hyperlipidemia: Secondary | ICD-10-CM | POA: Diagnosis not present

## 2020-08-30 ENCOUNTER — Ambulatory Visit (HOSPITAL_COMMUNITY)
Admission: RE | Admit: 2020-08-30 | Discharge: 2020-08-30 | Disposition: A | Discharge status: Home / Self Care | Payer: Medicare Other | Source: Ambulatory Visit | Attending: Internal Medicine | Admitting: Internal Medicine

## 2020-08-30 ENCOUNTER — Other Ambulatory Visit: Payer: Self-pay

## 2020-08-30 DIAGNOSIS — M5011 Cervical disc disorder with radiculopathy,  high cervical region: Secondary | ICD-10-CM | POA: Diagnosis not present

## 2020-08-30 DIAGNOSIS — M5412 Radiculopathy, cervical region: Secondary | ICD-10-CM | POA: Insufficient documentation

## 2020-08-30 DIAGNOSIS — M4722 Other spondylosis with radiculopathy, cervical region: Secondary | ICD-10-CM | POA: Diagnosis not present

## 2020-08-30 DIAGNOSIS — M50121 Cervical disc disorder at C4-C5 level with radiculopathy: Secondary | ICD-10-CM | POA: Diagnosis not present

## 2020-08-30 DIAGNOSIS — M50123 Cervical disc disorder at C6-C7 level with radiculopathy: Secondary | ICD-10-CM | POA: Diagnosis not present

## 2020-09-11 ENCOUNTER — Other Ambulatory Visit: Payer: Self-pay | Admitting: Family Medicine

## 2020-09-26 DIAGNOSIS — M5412 Radiculopathy, cervical region: Secondary | ICD-10-CM | POA: Diagnosis not present

## 2020-10-16 ENCOUNTER — Other Ambulatory Visit: Payer: Self-pay

## 2020-10-16 ENCOUNTER — Ambulatory Visit (INDEPENDENT_AMBULATORY_CARE_PROVIDER_SITE_OTHER): Payer: Medicare Other | Admitting: Cardiology

## 2020-10-16 ENCOUNTER — Encounter: Payer: Self-pay | Admitting: Cardiology

## 2020-10-16 VITALS — BP 156/70 | HR 70 | Ht 73.0 in | Wt 181.8 lb

## 2020-10-16 DIAGNOSIS — I350 Nonrheumatic aortic (valve) stenosis: Secondary | ICD-10-CM

## 2020-10-16 DIAGNOSIS — R002 Palpitations: Secondary | ICD-10-CM

## 2020-10-16 DIAGNOSIS — I471 Supraventricular tachycardia: Secondary | ICD-10-CM

## 2020-10-16 MED ORDER — METOPROLOL TARTRATE 25 MG PO TABS: 25.0000 mg | ORAL_TABLET | Freq: Two times a day (BID) | ORAL | 3 refills | Status: DC

## 2020-10-16 NOTE — Patient Instructions (Addendum)
Medication Instructions:  Increase Lopressor 25mg  twice a day. Continue all other medications.    Labwork: none  Testing/Procedures: none  Follow-Up: 6 months   Any Other Special Instructions Will Be Listed Below (If Applicable).  If you need a refill on your cardiac medications before your next appointment, please call your pharmacy.

## 2020-10-16 NOTE — Progress Notes (Signed)
Clinical Summary Mr. Mihelich is a 70 y.o.maleseen today for follow up of the following medical problems.      1. Papitations - long history of symtpoms.  -2017 monitor with just PACs   - noted from tele lead during 07/2019 echo, rates 130s narrow complex regular - 07/2019 7 day event monitor without signficant arrhythmias - 09/14/2019 EKG shows SVT     - occasional palpitations at times.  - episode of SOB and tachycardia last week. was using push mower x 30 minutes. Sat for a bit, got up and started walking. - +SOB, legs weak, nonspecific feeling in chest. Checked HR and was 135, no specific palpintations. - sporadic episodes.      2.Possible Bicuspid aortic valve/ Aortic stenosis - somewhat questionable on prior imaging - CTA chest 12/2004 without significant aortopathy.  - 2019 echo LVEF 60-65%, mild to mod AS (mean grad 17, AVA VTI 1.4)     07/2019 echo: LVEF 55-60%, mild to mod AS - no recent symptoms   3. CLL - followed at cancer center   4. COVID 19 - diagnosed in 05/2019, received outpatient monoclonal Ab - has not been vaccinated.    5. COPD - admit 05/2019 with COPD exacerbation, pneumonia     6. AAA screen - had prior CT 12/2015 A/P for flank pain, no aneurysm noted   Home bp's 120s/70s   Past Medical History:  Diagnosis Date   Aortic valve disorder    Mild insufficiency   Chronic lymphocytic leukemia ([facility])    01/2012: WBC-90.2, H&H-15.1/45.3, platelets-185 03/31/12: Verified with Dr. Tressie Stalker that no precautions or modification of medical regime are required prior to orthopaedic surgery.    CLL (chronic lymphocytic leukemia) ([facility])    CMV (cytomegalovirus) ([facility])    COPD (chronic obstructive pulmonary disease) ([facility])    Depression with anxiety    DJD (degenerative joint disease)    GERD (gastroesophageal reflux disease)    Hyperlipidemia    Hypogammaglobulinemia ([facility]) 09/23/2019   Lipoma    left chest wall   Palpitations 2004   PVCs; borderline  stress nuclear in 2004, negative in 2007; Echo 2007; AV-sclerotic, very mild AI   Pneumonia    Right bundle Ayiana Winslett block    + left posterior fascicular block   Tobacco abuse    80 pack years     No Known Allergies   Current Outpatient Medications  Medication Sig Dispense Refill   albuterol (PROVENTIL HFA;VENTOLIN HFA) 108 (90 BASE) MCG/ACT inhaler Inhale 2 puffs into the lungs every 6 (six) hours as needed for wheezing or shortness of breath.      ALPRAZolam (XANAX) 1 MG tablet Take 1 mg by mouth 3 (three) times daily as needed for anxiety.      aspirin 325 MG tablet Take 325 mg by mouth daily.     buPROPion (WELLBUTRIN SR) 150 MG 12 hr tablet Take 150 mg by mouth daily.     calcium carbonate (OS-CAL) 600 MG TABS Take 600 mg by mouth daily.     celecoxib (CELEBREX) 100 MG capsule Take 100 mg by mouth 2 (two) times daily.     clotrimazole-betamethasone (LOTRISONE) cream Apply 1 application topically daily as needed. Affected area     diltiazem (CARDIZEM) 30 MG tablet Take 30 mg by mouth as needed.     fish oil-omega-3 fatty acids 1000 MG capsule Take 1 g by mouth daily.     fluticasone (FLONASE) 50 MCG/ACT nasal spray Place 2 sprays into  the nose daily.     HYDROcodone-acetaminophen (NORCO/VICODIN) 5-325 MG per tablet Take 1 tablet by mouth every 6 (six) hours as needed for moderate pain.      Loratadine 10 MG CAPS Take 10 mg by mouth daily.     lovastatin (MEVACOR) 20 MG tablet Take 20 mg by mouth daily.     metoprolol tartrate (LOPRESSOR) 25 MG tablet Take 0.5 tablets (12.5 mg total) by mouth 2 (two) times daily. 90 tablet 2   Multiple Vitamin (MULTIVITAMIN) tablet Take 1 tablet by mouth daily.     niacin (NIASPAN) 500 MG CR tablet Take 500 mg by mouth daily.     valACYclovir (VALTREX) 500 MG tablet Take 500 mg by mouth daily.     No current facility-administered medications for this visit.     Past Surgical History:  Procedure Laterality Date   COLONOSCOPY  01/2012   Negative  screening study   GANGLION CYST EXCISION  1997   left wrist   ROTATOR CUFF REPAIR  1997   right   SHOULDER ARTHROSCOPY WITH SUBACROMIAL DECOMPRESSION  04/09/2012   Procedure: SHOULDER ARTHROSCOPY WITH SUBACROMIAL DECOMPRESSION;  Surgeon: Ninetta Lights, MD;  Location: [facility];  Service: Orthopedics;  Laterality: Left;  LEFT SHOULDER ARTHROSCOPY, SUBACROMIAL DECOMPRESSION, PARTIAL ACROMIOPLASTY WITH CORACOACROMIAL RELEASE, DISTAL CLAVICULECTOMY WITH ROTATOR CUFF REPAIR, DEBRIDEMENT OF LABRUM     No Known Allergies    Family History  Problem Relation Age of Onset   Stroke Mother    Heart attack Father    Cancer Sister        colon   Colon cancer Sister    Cancer Brother        2 brothers died with lung cancer     Social History Mr. Clute reports that he has been smoking cigarettes. He started smoking about 56 years ago. He has a 45.00 pack-year smoking history. He has never used smokeless tobacco. Mr. Serio reports no history of alcohol use.   Review of Systems CONSTITUTIONAL: No weight loss, fever, chills, weakness or fatigue.  HEENT: Eyes: No visual loss, blurred vision, double vision or yellow sclerae.No hearing loss, sneezing, congestion, runny nose or sore throat.  SKIN: No rash or itching.  CARDIOVASCULAR: per hpi RESPIRATORY: No shortness of breath, cough or sputum.  GASTROINTESTINAL: No anorexia, nausea, vomiting or diarrhea. No abdominal pain or blood.  GENITOURINARY: No burning on urination, no polyuria NEUROLOGICAL: No headache, dizziness, syncope, paralysis, ataxia, numbness or tingling in the extremities. No change in bowel or bladder control.  MUSCULOSKELETAL: No muscle, back pain, joint pain or stiffness.  LYMPHATICS: No enlarged nodes. No history of splenectomy.  PSYCHIATRIC: No history of depression or anxiety.  ENDOCRINOLOGIC: No reports of sweating, cold or heat intolerance. No polyuria or polydipsia.  [name]   Physical  Examination Today's Vitals   10/16/20 1113  BP: (!) 156/70  Pulse: 70  SpO2: 99%  Weight: 181 lb 12.8 oz (82.5 kg)  Height: 6\' 1"  (1.854 m)   Body mass index is 23.99 kg/m.  Gen: resting comfortably, no acute distress HEENT: no scleral icterus, pupils equal round and reactive, no palptable cervical adenopathy,  CV: RRR, 2/6 systolic murmur rusb, no jvd Resp: Clear to auscultation bilaterally GI: abdomen is soft, non-tender, non-distended, normal bowel sounds, no hepatosplenomegaly MSK: extremities are warm, no edema.  Skin: warm, no rash Neuro:  no focal deficits Psych: appropriate affect   Diagnostic Studies  Jan 2021 carotid US IMPRESSION: 1. Bilateral carotid bifurcation  plaque resulting in less than 50% diameter ICA stenosis. 2. Antegrade bilateral vertebral arterial flow.   07/2019 echo IMPRESSIONS     1. Left ventricular ejection fraction, by estimation, is 55 to 60%. The  left ventricle has normal function. The left ventricle has no regional  wall motion abnormalities. Left ventricular diastolic parameters are  indeterminate.   2. Tachycardia with heart rate 135 noted near beginning of study. Same  QRS morphology and no obvious P waves - question SVT, possibly atrial  flutter.   3. Right ventricular systolic function is normal. The right ventricular  size is normal. Tricuspid regurgitation signal is inadequate for assessing  PA pressure.   4. The mitral valve is grossly normal, mild annular calcification.  Trivial mitral valve regurgitation.   5. The aortic valve is likely tricuspid, moderately calcified and not  well visualized. Cusp excursion is limited. Aortic valve regurgitation is  mild. Aortic valve mean gradient measures 12.0 mmHg. Appears that LVOT VTI  may be overestimated and the peak  AV velocity is higher than that recorded. Cannot be conclusive about  degree of aortic stenosis, but suspect moderate. Consider limited study  with further  interrogation, particularly if heart rate and rhythm are in  better control.         Assessment and Plan   1. Palpitaitons/PSVT - previous monitor with just occasional PACs, no significant arrhythmias. Did catch an episode of SVT during his echo and by 09/14/2019 EKG - increase lopressor to 25mg  bid, room to titrate as needed. Has prn dilt as well.      2.  Aortic stenosis - mild to mod AS, no symptoms - would repeat echo next year.      Arnoldo Lenis, M.D.

## 2020-11-20 ENCOUNTER — Other Ambulatory Visit: Payer: Self-pay

## 2020-11-20 ENCOUNTER — Inpatient Hospital Stay (HOSPITAL_COMMUNITY): Payer: Medicare Other | Attending: Hematology

## 2020-11-20 DIAGNOSIS — Z79899 Other long term (current) drug therapy: Secondary | ICD-10-CM | POA: Insufficient documentation

## 2020-11-20 DIAGNOSIS — D801 Nonfamilial hypogammaglobulinemia: Secondary | ICD-10-CM | POA: Insufficient documentation

## 2020-11-20 DIAGNOSIS — C9110 Chronic lymphocytic leukemia of B-cell type not having achieved remission: Secondary | ICD-10-CM | POA: Insufficient documentation

## 2020-11-20 LAB — COMPREHENSIVE METABOLIC PANEL
ALT: 18 U/L (ref 0–44)
AST: 24 U/L (ref 15–41)
Albumin: 4.3 g/dL (ref 3.5–5.0)
Alkaline Phosphatase: 116 U/L (ref 38–126)
Anion gap: 4 — ABNORMAL LOW (ref 5–15)
BUN: 21 mg/dL (ref 8–23)
CO2: 29 mmol/L (ref 22–32)
Calcium: 8.8 mg/dL — ABNORMAL LOW (ref 8.9–10.3)
Chloride: 107 mmol/L (ref 98–111)
Creatinine, Ser: 1.27 mg/dL — ABNORMAL HIGH (ref 0.61–1.24)
GFR, Estimated: >60 mL/min (ref >60)
Glucose, Bld: 108 mg/dL — ABNORMAL HIGH (ref 70–99)
Potassium: 4.9 mmol/L (ref 3.5–5.1)
Sodium: 140 mmol/L (ref 135–145)
Total Bilirubin: 0.7 mg/dL (ref 0.3–1.2)
Total Protein: 6.3 g/dL — ABNORMAL LOW (ref 6.5–8.1)

## 2020-11-20 LAB — CBC WITH DIFFERENTIAL/PLATELET
Basophils Absolute: 0 10*3/uL (ref 0.0–0.1)
Basophils Relative: 0 %
Eosinophils Absolute: 0 10*3/uL (ref 0.0–0.5)
Eosinophils Relative: 0 %
HCT: 46.4 % (ref 39.0–52.0)
Hemoglobin: 14.6 g/dL (ref 13.0–17.0)
Lymphocytes Relative: 93 %
Lymphs Abs: 140.3 10*3/uL — ABNORMAL HIGH (ref 0.7–4.0)
MCH: 31.7 pg (ref 26.0–34.0)
MCHC: 31.5 g/dL (ref 30.0–36.0)
MCV: 100.7 fL — ABNORMAL HIGH (ref 80.0–100.0)
Monocytes Absolute: 3 10*3/uL — ABNORMAL HIGH (ref 0.1–1.0)
Monocytes Relative: 2 %
Neutro Abs: 7.5 10*3/uL (ref 1.7–7.7)
Neutrophils Relative %: 5 %
Platelets: 141 10*3/uL — ABNORMAL LOW (ref 150–400)
RBC: 4.61 MIL/uL (ref 4.22–5.81)
RDW: 14.1 % (ref 11.5–15.5)
WBC: 150.9 10*3/uL (ref 4.0–10.5)
nRBC: 0 % (ref 0.0–0.2)

## 2020-11-20 LAB — LACTATE DEHYDROGENASE: LDH: 148 U/L (ref 98–192)

## 2020-11-20 NOTE — Progress Notes (Unsigned)
CRITICAL VALUE ALERT Critical value received:  WBC 150.9 Date of notification:  11/20/20 Time of notification: 0938 pm  Critical value read back:  Yes.   Nurse who received alert:  Bpresnell RN / Received from Glennon Hamilton lab tech MD notified time and response: Dr. Delton Coombes.

## 2020-11-22 DIAGNOSIS — M5136 Other intervertebral disc degeneration, lumbar region: Secondary | ICD-10-CM | POA: Diagnosis not present

## 2020-11-22 DIAGNOSIS — M5416 Radiculopathy, lumbar region: Secondary | ICD-10-CM | POA: Diagnosis not present

## 2020-11-22 DIAGNOSIS — M4300 Spondylolysis, site unspecified: Secondary | ICD-10-CM | POA: Diagnosis not present

## 2020-11-22 DIAGNOSIS — M4310 Spondylolisthesis, site unspecified: Secondary | ICD-10-CM | POA: Diagnosis not present

## 2020-11-26 NOTE — Progress Notes (Signed)
Alfred Brown, Alfred Brown [id number]   CLINIC:  Medical Oncology/Hematology  PCP:  [facility], [place] / [place] [address] [phone]   REASON FOR VISIT:  Follow-up for CLL  PRIOR THERAPY: none  NGS Results: not done  CURRENT THERAPY: Intermittent IVIG last on 06/03/2019  BRIEF ONCOLOGIC HISTORY:  Oncology History   No history exists.    CANCER STAGING: Cancer Staging No matching staging information was found for the patient.  INTERVAL HISTORY:  Alfred Brown, a 70 y.o. male, returns for routine follow-up of his CLL. Alfred Brown was last seen on 08/24/20.   Today he reports feeling well. He denies fevers, night sweats, weight loss and recent infections. He reports lower back pain radiating down his left leg.   REVIEW OF SYSTEMS:  Review of Systems  Constitutional:  Positive for fatigue (50%). Negative for appetite change, fever and unexpected weight change.  Respiratory:  Positive for shortness of breath (with exertion).   All other systems reviewed and are negative.  PAST MEDICAL/SURGICAL HISTORY:  Past Medical History:  Diagnosis Date   Aortic valve disorder    Mild insufficiency   Chronic lymphocytic leukemia ([place])    01/2012: WBC-90.2, H&H-15.1/45.3, platelets-185 03/31/12: Verified with Dr. Tressie Stalker that no precautions or modification of medical regime are required prior to orthopaedic surgery.    CLL (chronic lymphocytic leukemia) (HCC)    CMV (cytomegalovirus) (HCC)    COPD (chronic obstructive pulmonary disease) (HCC)    Depression with anxiety    DJD (degenerative joint disease)    GERD (gastroesophageal reflux disease)    Hyperlipidemia    Hypogammaglobulinemia ([place]) 09/23/2019   Lipoma    left chest wall   Palpitations 2004   PVCs; borderline stress nuclear in 2004, negative in 2007; Echo 2007; AV-sclerotic, very mild AI   Pneumonia    Right bundle branch block    + left posterior fascicular  block   Tobacco abuse    80 pack years   Past Surgical History:  Procedure Laterality Date   COLONOSCOPY  01/2012   Negative screening study   GANGLION CYST EXCISION  1997   left wrist   ROTATOR CUFF REPAIR  1997   right   SHOULDER ARTHROSCOPY WITH SUBACROMIAL DECOMPRESSION  04/09/2012   Procedure: SHOULDER ARTHROSCOPY WITH SUBACROMIAL DECOMPRESSION;  Surgeon: Ninetta Lights, MD;  Location: [facility];  Service: Orthopedics;  Laterality: Left;  LEFT SHOULDER ARTHROSCOPY, SUBACROMIAL DECOMPRESSION, PARTIAL ACROMIOPLASTY WITH CORACOACROMIAL RELEASE, DISTAL CLAVICULECTOMY WITH ROTATOR CUFF REPAIR, DEBRIDEMENT OF LABRUM    SOCIAL HISTORY:  Social History   Socioeconomic History   Marital status: Married    Spouse name: Not on file   Number of children: Not on file   Years of education: Not on file   Highest education level: Not on file  Occupational History   Not on file  Tobacco Use   Smoking status: Every Day    Packs/day: 1.00    Years: 45.00    Pack years: 45.00    Types: Cigarettes    Start date: 12/03/1963   Smokeless tobacco: Never  Substance and Sexual Activity   Alcohol use: No    Alcohol/week: 0.0 standard drinks   Drug use: No   Sexual activity: Not on file  Other Topics Concern   Not on file  Social History Narrative   Not on file   Social Determinants of Health   Financial Resource Strain: Not on  file  Food Insecurity: Not on file  Transportation Needs: Not on file  Physical Activity: Not on file  Stress: Not on file  Social Connections: Not on file  Intimate Partner Violence: Not on file    FAMILY HISTORY:  Family History  Problem Relation Age of Onset   Stroke Mother    Heart attack Father    Cancer Sister        colon   Colon cancer Sister    Cancer Brother        2 brothers died with lung cancer    CURRENT MEDICATIONS:  Current Outpatient Medications  Medication Sig Dispense Refill   albuterol (PROVENTIL HFA;VENTOLIN HFA)  108 (90 BASE) MCG/ACT inhaler Inhale 2 puffs into the lungs every 6 (six) hours as needed for wheezing or shortness of breath.      ALPRAZolam (XANAX) 1 MG tablet Take 1 mg by mouth 3 (three) times daily as needed for anxiety.      aspirin 325 MG tablet Take 325 mg by mouth daily.     buPROPion (WELLBUTRIN SR) 150 MG 12 hr tablet Take 150 mg by mouth daily.     calcium carbonate (OS-CAL) 600 MG TABS Take 600 mg by mouth daily.     celecoxib (CELEBREX) 100 MG capsule Take 100 mg by mouth 2 (two) times daily.     clotrimazole-betamethasone (LOTRISONE) cream Apply 1 application topically daily as needed. Affected area     diltiazem (CARDIZEM) 30 MG tablet Take 30 mg by mouth as needed.     fish oil-omega-3 fatty acids 1000 MG capsule Take 1 g by mouth daily.     fluticasone (FLONASE) 50 MCG/ACT nasal spray Place 2 sprays into the nose daily.     HYDROcodone-acetaminophen (NORCO/VICODIN) 5-325 MG per tablet Take 1 tablet by mouth every 6 (six) hours as needed for moderate pain.      Loratadine 10 MG CAPS Take 10 mg by mouth daily.     lovastatin (MEVACOR) 20 MG tablet Take 20 mg by mouth daily.     metoprolol tartrate (LOPRESSOR) 25 MG tablet Take 1 tablet (25 mg total) by mouth 2 (two) times daily. 180 tablet 3   Multiple Vitamin (MULTIVITAMIN) tablet Take 1 tablet by mouth daily.     niacin (NIASPAN) 500 MG CR tablet Take 500 mg by mouth daily.     valACYclovir (VALTREX) 500 MG tablet Take 500 mg by mouth daily.     No current facility-administered medications for this visit.    ALLERGIES:  No Known Allergies  PHYSICAL EXAM:  Performance status (ECOG): 1 - Symptomatic but completely ambulatory  There were no vitals filed for this visit. Wt Readings from Last 3 Encounters:  10/16/20 181 lb 12.8 oz (82.5 kg)  08/24/20 177 lb 3.2 oz (80.4 kg)  04/20/20 182 lb 9.6 oz (82.8 kg)   Physical Exam Vitals reviewed.  Constitutional:      Appearance: Normal appearance.  Cardiovascular:      Rate and Rhythm: Normal rate and regular rhythm.     Pulses: Normal pulses.     Heart sounds: Normal heart sounds.  Pulmonary:     Effort: Pulmonary effort is normal.     Breath sounds: Normal breath sounds.  Abdominal:     Palpations: Abdomen is soft. There is no hepatomegaly, splenomegaly or mass.     Tenderness: There is no abdominal tenderness.  Musculoskeletal:     Right lower leg: No edema.     Left  lower leg: No edema.  Lymphadenopathy:     Cervical: No cervical adenopathy.     Right cervical: No superficial cervical adenopathy.    Left cervical: No superficial cervical adenopathy.     Upper Body:     Right upper body: Axillary adenopathy (sub cm) present. No supraclavicular adenopathy.     Left upper body: Axillary adenopathy (multiple, sub cm) present. No supraclavicular adenopathy.     Lower Body: Right inguinal adenopathy (sub cm) present. No left inguinal adenopathy.  Neurological:     General: No focal deficit present.     Mental Status: He is alert and oriented to person, place, and time.  Psychiatric:        Mood and Affect: Mood normal.        Behavior: Behavior normal.     LABORATORY DATA:  I have reviewed the labs as listed.  CBC Latest Ref Rng & Units 11/20/2020 08/17/2020 04/04/2020  WBC 4.0 - 10.5 K/uL 150.9(HH) 303.0(HH) 143.5(HH)  Hemoglobin 13.0 - 17.0 g/dL 14.6 14.0 14.5  Hematocrit 39.0 - 52.0 % 46.4 49.4 46.7  Platelets 150 - 400 K/uL 141(L) 158 122(L)   CMP Latest Ref Rng & Units 11/20/2020 08/17/2020 04/04/2020  Glucose 70 - 99 mg/dL 108(H) 105(H) 101(H)  BUN 8 - 23 mg/dL 21 27(H) 19  Creatinine 0.61 - 1.24 mg/dL 1.27(H) 1.06 1.04  Sodium 135 - 145 mmol/L 140 140 141  Potassium 3.5 - 5.1 mmol/L 4.9 4.6 4.9  Chloride 98 - 111 mmol/L 107 106 107  CO2 22 - 32 mmol/L 29 26 26   Calcium 8.9 - 10.3 mg/dL 8.8(L) 9.1 9.5  Total Protein 6.5 - 8.1 g/dL 6.3(L) 6.6 6.8  Total Bilirubin 0.3 - 1.2 mg/dL 0.7 1.0 0.9  Alkaline Phos 38 - 126 U/L 116 106 124  AST 15  - 41 U/L 24 21 23   ALT 0 - 44 U/L 18 22 16     DIAGNOSTIC IMAGING:  I have independently reviewed the scans and discussed with the patient. No results found.   ASSESSMENT:  1.  Stage II CLL by Rai system: -Diagnosed around 2010, observation since then. -Denies any fevers, night sweats or weight loss in the last 6 months. -Patient was diagnosed with COVID-19 on 05/09/2019.  He was also admitted in March for IVIG infusion.     2.  Hypogammaglobulinemia: -This is from underlying CLL.  Trough immunoglobulins are low at 3 50-400.  He did not have any recurrent infections. -However because of his recent COVID-19 infection, he received IVIG 400 mg/kg 1 dose on 06/03/2019.   3.  Shortness of breath on exertion: -he reports shortness of breath on exertion which has not improved since recent hospitalization. -I have done chest x-ray in the office today and reviewed it and compare it from chest x-ray from June 02, 2019.  Infiltrates have slightly improved.   PLAN:  1.  Stage II CLL by Rai system: - He does not report any fevers, night sweats or weight loss in the last 3 months. - Subcentimeter lymph nodes under bilateral axillary and right groin have been stable.  No infections in the past 3 months. - Reviewed labs from 11/20/2020.  White count improved 150 with normal hemoglobin.  Mild thrombocytopenia stable.  LDH was normal.  Creatinine increased to 1.27. - Recommend follow-up in 6 months with repeat labs and physical exam.   2.  Hypogammaglobulinemia: -She did not report any recurrent infections in the past 3 months. - Last IgG level  was 434 on 08/17/2020. - We will consider IVIG if there is any recurrent infections.   Orders placed this encounter:  No orders of the defined types were placed in this encounter.    Derek Jack, MD [place] [phone]   I, Thana Ates, am acting as a scribe for Dr. Derek Jack.  I, Derek Jack MD, have  reviewed the above documentation for accuracy and completeness, and I agree with the above.

## 2020-11-27 ENCOUNTER — Other Ambulatory Visit: Payer: Self-pay

## 2020-11-27 ENCOUNTER — Inpatient Hospital Stay (HOSPITAL_BASED_OUTPATIENT_CLINIC_OR_DEPARTMENT_OTHER): Payer: Medicare Other | Admitting: Hematology

## 2020-11-27 ENCOUNTER — Encounter (HOSPITAL_COMMUNITY): Payer: Self-pay | Admitting: Hematology

## 2020-11-27 VITALS — BP 126/60 | HR 71 | Temp 98.0°F | Resp 18 | Wt 183.0 lb

## 2020-11-27 DIAGNOSIS — C9110 Chronic lymphocytic leukemia of B-cell type not having achieved remission: Secondary | ICD-10-CM

## 2020-11-27 DIAGNOSIS — D801 Nonfamilial hypogammaglobulinemia: Secondary | ICD-10-CM | POA: Diagnosis not present

## 2020-11-27 DIAGNOSIS — Z79899 Other long term (current) drug therapy: Secondary | ICD-10-CM | POA: Diagnosis not present

## 2020-11-27 NOTE — Patient Instructions (Addendum)
[facility] Discharge Instructions  You were seen today by Dr. Katragadda. He went over your recent results. Dr. Katragadda will see you back in 6 months for labs and follow up.   Thank you for choosing  Cancer Center at [facility] to provide your oncology and hematology care.  To afford each patient quality time with our provider, please arrive at least 15 minutes before your scheduled appointment time.   If you have a lab appointment with the Cancer Center please come in thru the Main Entrance and check in at the main information desk  You need to re-schedule your appointment should you arrive 10 or more minutes late.  We strive to give you quality time with our providers, and arriving late affects you and other patients whose appointments are after yours.  Also, if you no show three or more times for appointments you may be dismissed from the clinic at the providers discretion.     Again, thank you for choosing [facility].  Our hope is that these requests will decrease the amount of time that you wait before being seen by our physicians.       _____________________________________________________________  Should you have questions after your visit to [facility], please contact our office at [phone] between the hours of 8:00 a.m. and 4:30 p.m.  Voicemails left after 4:00 p.m. will not be returned until the following business day.  For prescription refill requests, have your pharmacy contact our office and allow 72 hours.    Cancer Center Support Programs:   > Cancer Support Group  2nd Tuesday of the month 1pm-2pm, Journey Room    

## 2020-11-28 ENCOUNTER — Other Ambulatory Visit (HOSPITAL_COMMUNITY): Payer: Self-pay | Admitting: Neurosurgery

## 2020-11-28 ENCOUNTER — Other Ambulatory Visit: Payer: Self-pay | Admitting: Neurosurgery

## 2020-11-28 DIAGNOSIS — M4310 Spondylolisthesis, site unspecified: Secondary | ICD-10-CM

## 2020-11-28 DIAGNOSIS — M4300 Spondylolysis, site unspecified: Secondary | ICD-10-CM

## 2020-12-08 ENCOUNTER — Other Ambulatory Visit: Payer: Self-pay

## 2020-12-08 ENCOUNTER — Ambulatory Visit (HOSPITAL_COMMUNITY)
Admission: RE | Admit: 2020-12-08 | Discharge: 2020-12-08 | Disposition: A | Discharge status: Home / Self Care | Payer: Medicare Other | Source: Ambulatory Visit | Attending: Neurosurgery | Admitting: Neurosurgery

## 2020-12-08 DIAGNOSIS — M4300 Spondylolysis, site unspecified: Secondary | ICD-10-CM | POA: Diagnosis not present

## 2020-12-08 DIAGNOSIS — M47816 Spondylosis without myelopathy or radiculopathy, lumbar region: Secondary | ICD-10-CM | POA: Diagnosis not present

## 2020-12-08 DIAGNOSIS — M4310 Spondylolisthesis, site unspecified: Secondary | ICD-10-CM | POA: Insufficient documentation

## 2021-01-04 DIAGNOSIS — M4300 Spondylolysis, site unspecified: Secondary | ICD-10-CM | POA: Diagnosis not present

## 2021-01-04 DIAGNOSIS — I10 Essential (primary) hypertension: Secondary | ICD-10-CM | POA: Diagnosis not present

## 2021-01-04 DIAGNOSIS — M4310 Spondylolisthesis, site unspecified: Secondary | ICD-10-CM | POA: Diagnosis not present

## 2021-01-04 DIAGNOSIS — M5416 Radiculopathy, lumbar region: Secondary | ICD-10-CM | POA: Diagnosis not present

## 2021-01-18 DIAGNOSIS — M4300 Spondylolysis, site unspecified: Secondary | ICD-10-CM | POA: Diagnosis not present

## 2021-01-18 DIAGNOSIS — M5416 Radiculopathy, lumbar region: Secondary | ICD-10-CM | POA: Diagnosis not present

## 2021-01-23 DIAGNOSIS — Z23 Encounter for immunization: Secondary | ICD-10-CM | POA: Diagnosis not present

## 2021-01-29 DIAGNOSIS — Z6823 Body mass index (BMI) 23.0-23.9, adult: Secondary | ICD-10-CM | POA: Diagnosis not present

## 2021-01-29 DIAGNOSIS — J309 Allergic rhinitis, unspecified: Secondary | ICD-10-CM | POA: Diagnosis not present

## 2021-01-29 DIAGNOSIS — J329 Chronic sinusitis, unspecified: Secondary | ICD-10-CM | POA: Diagnosis not present

## 2021-02-07 DIAGNOSIS — M4300 Spondylolysis, site unspecified: Secondary | ICD-10-CM | POA: Diagnosis not present

## 2021-03-05 DIAGNOSIS — C4441 Basal cell carcinoma of skin of scalp and neck: Secondary | ICD-10-CM | POA: Diagnosis not present

## 2021-03-05 DIAGNOSIS — C44319 Basal cell carcinoma of skin of other parts of face: Secondary | ICD-10-CM | POA: Diagnosis not present

## 2021-03-05 DIAGNOSIS — C44519 Basal cell carcinoma of skin of other part of trunk: Secondary | ICD-10-CM | POA: Diagnosis not present

## 2021-03-05 DIAGNOSIS — X32XXXD Exposure to sunlight, subsequent encounter: Secondary | ICD-10-CM | POA: Diagnosis not present

## 2021-03-05 DIAGNOSIS — L570 Actinic keratosis: Secondary | ICD-10-CM | POA: Diagnosis not present

## 2021-03-15 ENCOUNTER — Emergency Department (HOSPITAL_COMMUNITY): Payer: Medicare Other

## 2021-03-15 ENCOUNTER — Encounter (HOSPITAL_COMMUNITY): Payer: Self-pay | Admitting: *Deleted

## 2021-03-15 ENCOUNTER — Emergency Department (HOSPITAL_COMMUNITY)
Admission: EM | Admit: 2021-03-15 | Discharge: 2021-03-15 | Disposition: A | Discharge status: Home / Self Care | Payer: Medicare Other | Attending: Emergency Medicine | Admitting: Emergency Medicine

## 2021-03-15 DIAGNOSIS — J101 Influenza due to other identified influenza virus with other respiratory manifestations: Secondary | ICD-10-CM | POA: Diagnosis not present

## 2021-03-15 DIAGNOSIS — R059 Cough, unspecified: Secondary | ICD-10-CM | POA: Diagnosis not present

## 2021-03-15 DIAGNOSIS — J449 Chronic obstructive pulmonary disease, unspecified: Secondary | ICD-10-CM | POA: Insufficient documentation

## 2021-03-15 DIAGNOSIS — R509 Fever, unspecified: Secondary | ICD-10-CM | POA: Diagnosis not present

## 2021-03-15 DIAGNOSIS — F17210 Nicotine dependence, cigarettes, uncomplicated: Secondary | ICD-10-CM | POA: Insufficient documentation

## 2021-03-15 DIAGNOSIS — Z28310 Unvaccinated for covid-19: Secondary | ICD-10-CM | POA: Diagnosis not present

## 2021-03-15 DIAGNOSIS — Z8616 Personal history of COVID-19: Secondary | ICD-10-CM | POA: Insufficient documentation

## 2021-03-15 DIAGNOSIS — Z7982 Long term (current) use of aspirin: Secondary | ICD-10-CM | POA: Diagnosis not present

## 2021-03-15 DIAGNOSIS — C9110 Chronic lymphocytic leukemia of B-cell type not having achieved remission: Secondary | ICD-10-CM | POA: Diagnosis not present

## 2021-03-15 DIAGNOSIS — I10 Essential (primary) hypertension: Secondary | ICD-10-CM | POA: Diagnosis not present

## 2021-03-15 DIAGNOSIS — Z20822 Contact with and (suspected) exposure to covid-19: Secondary | ICD-10-CM | POA: Insufficient documentation

## 2021-03-15 DIAGNOSIS — J9811 Atelectasis: Secondary | ICD-10-CM | POA: Diagnosis not present

## 2021-03-15 LAB — BASIC METABOLIC PANEL
Anion gap: 6 (ref 5–15)
BUN: 23 mg/dL (ref 8–23)
CO2: 25 mmol/L (ref 22–32)
Calcium: 8.6 mg/dL — ABNORMAL LOW (ref 8.9–10.3)
Chloride: 103 mmol/L (ref 98–111)
Creatinine, Ser: 1.04 mg/dL (ref 0.61–1.24)
GFR, Estimated: >60 mL/min (ref >60)
Glucose, Bld: 112 mg/dL — ABNORMAL HIGH (ref 70–99)
Potassium: 5.1 mmol/L (ref 3.5–5.1)
Sodium: 134 mmol/L — ABNORMAL LOW (ref 135–145)

## 2021-03-15 LAB — CBC WITH DIFFERENTIAL/PLATELET
Basophils Absolute: 0 10*3/uL (ref 0.0–0.1)
Basophils Relative: 0 %
Eosinophils Absolute: 0 10*3/uL (ref 0.0–0.5)
Eosinophils Relative: 0 %
HCT: 45.6 % (ref 39.0–52.0)
Hemoglobin: 14.3 g/dL (ref 13.0–17.0)
Lymphocytes Relative: 91 %
Lymphs Abs: 119.5 10*3/uL — ABNORMAL HIGH (ref 0.7–4.0)
MCH: 31.1 pg (ref 26.0–34.0)
MCHC: 31.4 g/dL (ref 30.0–36.0)
MCV: 99.1 fL (ref 80.0–100.0)
Monocytes Absolute: 9.2 10*3/uL — ABNORMAL HIGH (ref 0.1–1.0)
Monocytes Relative: 7 %
Neutro Abs: 2.6 10*3/uL (ref 1.7–7.7)
Neutrophils Relative %: 2 %
Platelets: 114 10*3/uL — ABNORMAL LOW (ref 150–400)
RBC: 4.6 MIL/uL (ref 4.22–5.81)
RDW: 15.1 % (ref 11.5–15.5)
Smear Review: DECREASED
WBC Morphology: ABNORMAL
WBC: 131.3 10*3/uL (ref 4.0–10.5)
nRBC: 0 % (ref 0.0–0.2)

## 2021-03-15 LAB — RESP PANEL BY RT-PCR (FLU A&B, COVID) ARPGX2
Influenza A by PCR: POSITIVE — AB
Influenza B by PCR: NEGATIVE
SARS Coronavirus 2 by RT PCR: NEGATIVE

## 2021-03-15 MED ORDER — OSELTAMIVIR PHOSPHATE 75 MG PO CAPS: 75.0000 mg | ORAL_CAPSULE | Freq: Two times a day (BID) | ORAL | 0 refills | Status: DC

## 2021-03-15 MED ORDER — PREDNISONE 10 MG PO TABS: 30.0000 mg | ORAL_TABLET | Freq: Every day | ORAL | 0 refills | Status: DC

## 2021-03-15 MED ORDER — DOXYCYCLINE HYCLATE 100 MG PO CAPS: 100.0000 mg | ORAL_CAPSULE | Freq: Two times a day (BID) | ORAL | 0 refills | Status: AC

## 2021-03-15 NOTE — Discharge Instructions (Addendum)
You have influenza A,  starting you on antibiotics please take as prescribed, also given you Tamiflu as this can help shorten your illness/decrease the severity of it, but it does have a known side effect  of upset stomach.  I have also given you steroids I recommend withholding this medication  unless  wheezing presents itself.    To help with nasal congestion, I recommend over-the-counter pain medications like ibuprofen Tylenol for fever and pain control, nasal decongestions like Flonase and Zyrtec, Mucinex for cough.  If not eating recommend supplementing with Gatorade to help with electrolyte supplementation.  Follow-up PCP for further evaluation.  Come back to the emergency department if you develop chest pain, shortness of breath, severe abdominal pain, uncontrolled nausea, vomiting, diarrhea.

## 2021-03-15 NOTE — ED Provider Notes (Signed)
[facility] EMERGENCY DEPARTMENT Provider Note   CSN: [id number] Arrival date & time: 03/15/21  1730     History Chief Complaint  Patient presents with   Cough    Alfred Brown is a 70 y.o. male.  HPI  Patient with medical history including CLL currently in remission, COPD, GERD, presents with complaints of URI-like symptoms.  Patient states this started on Monday night, he endorses fevers, chills, runny nose, productive cough, some chest tightness when coughing, decrease in appetite, patient denies any chest pain, shortness of breath, states he still tolerating p.o., no constipation or diarrhea, no general body aches.  He denies any recent sick contacts, is vaccine against influenza but has not gotten his COVID-vaccine, he had a video visit with his PCP who recommended he come down to the ED for further evaluation.  He has no other complaints at this time.  Patient is currently not receiving chemotherapy medication or immunosuppressive's at this time.  Past Medical History:  Diagnosis Date   Aortic valve disorder    Mild insufficiency   Chronic lymphocytic leukemia ([place])    01/2012: WBC-90.2, H&H-15.1/45.3, platelets-185 03/31/12: Verified with Dr. Tressie Stalker that no precautions or modification of medical regime are required prior to orthopaedic surgery.    CLL (chronic lymphocytic leukemia) (HCC)    CMV (cytomegalovirus) (HCC)    COPD (chronic obstructive pulmonary disease) (HCC)    Depression with anxiety    DJD (degenerative joint disease)    GERD (gastroesophageal reflux disease)    Hyperlipidemia    Hypogammaglobulinemia ([place]) 09/23/2019   Lipoma    left chest wall   Palpitations 2004   PVCs; borderline stress nuclear in 2004, negative in 2007; Echo 2007; AV-sclerotic, very mild AI   Pneumonia    Right bundle branch block    + left posterior fascicular block   Tobacco abuse    80 pack years    Patient Active Problem List   Diagnosis Date Noted   Peyronie's disease  11/24/2019   Hypogammaglobulinemia ([place]) 09/23/2019   COPD GOLD 0 08/05/2019   COPD exacerbation ([place]) 06/03/2019   Acute and chronic respiratory failure with hypoxia ([place]) 06/03/2019   History of COVID-19 dx 05-09-19 06/03/2019   Pneumonia due to COVID-19 virus 06/02/2019   GERD (gastroesophageal reflux disease)    Hyperlipidemia    Palpitations    Chronic lymphocytic leukemia ([place])    Aortic valve disorder    Right bundle branch block    Tobacco abuse     Past Surgical History:  Procedure Laterality Date   COLONOSCOPY  01/2012   Negative screening study   GANGLION CYST EXCISION  1997   left wrist   ROTATOR CUFF REPAIR  1997   right   SHOULDER ARTHROSCOPY WITH SUBACROMIAL DECOMPRESSION  04/09/2012   Procedure: SHOULDER ARTHROSCOPY WITH SUBACROMIAL DECOMPRESSION;  Surgeon: Ninetta Lights, MD;  Location: [facility];  Service: Orthopedics;  Laterality: Left;  LEFT SHOULDER ARTHROSCOPY, SUBACROMIAL DECOMPRESSION, PARTIAL ACROMIOPLASTY WITH CORACOACROMIAL RELEASE, DISTAL CLAVICULECTOMY WITH ROTATOR CUFF REPAIR, DEBRIDEMENT OF LABRUM       Family History  Problem Relation Age of Onset   Stroke Mother    Heart attack Father    Cancer Sister        colon   Colon cancer Sister    Cancer Brother        2 brothers died with lung cancer    Social History   Tobacco Use   Smoking status: Every Day  Packs/day: 1.00    Years: 45.00    Pack years: 45.00    Types: Cigarettes    Start date: 12/03/1963   Smokeless tobacco: Never  Substance Use Topics   Alcohol use: No    Alcohol/week: 0.0 standard drinks   Drug use: No    Home Medications Prior to Admission medications   Medication Sig Start Date End Date Taking? Authorizing Provider  doxycycline (VIBRAMYCIN) 100 MG capsule Take 1 capsule (100 mg total) by mouth 2 (two) times daily for 7 days. 03/15/21 03/22/21 Yes Marcello Fennel, PA-C  oseltamivir (TAMIFLU) 75 MG capsule Take 1 capsule (75 mg total) by  mouth every 12 (twelve) hours. 03/15/21  Yes Marcello Fennel, PA-C  predniSONE (DELTASONE) 10 MG tablet Take 3 tablets (30 mg total) by mouth daily. 03/15/21  Yes Marcello Fennel, PA-C  albuterol (PROVENTIL HFA;VENTOLIN HFA) 108 (90 BASE) MCG/ACT inhaler Inhale 2 puffs into the lungs every 6 (six) hours as needed for wheezing or shortness of breath.     [provider]  ALPRAZolam Duanne Moron) 1 MG tablet Take 1 mg by mouth 3 (three) times daily as needed for anxiety.     [provider]  aspirin 325 MG tablet Take 325 mg by mouth daily.    [provider]  buPROPion (WELLBUTRIN SR) 150 MG 12 hr tablet Take 150 mg by mouth daily.    [provider]  calcium carbonate (OS-CAL) 600 MG TABS Take 600 mg by mouth daily.    [provider]  celecoxib (CELEBREX) 100 MG capsule Take 100 mg by mouth 2 (two) times daily.    [provider]  clotrimazole-betamethasone (LOTRISONE) cream Apply 1 application topically daily as needed. Affected area 06/19/15   [provider]  diltiazem (CARDIZEM) 30 MG tablet Take 30 mg by mouth as needed.    [provider]  fish oil-omega-3 fatty acids 1000 MG capsule Take 1 g by mouth daily.    [provider]  fluticasone (FLONASE) 50 MCG/ACT nasal spray Place 2 sprays into the nose daily.    [provider]  HYDROcodone-acetaminophen (NORCO/VICODIN) 5-325 MG per tablet Take 1 tablet by mouth every 6 (six) hours as needed for moderate pain.  08/10/14   [provider]  Loratadine 10 MG CAPS Take 10 mg by mouth daily.    [provider]  lovastatin (MEVACOR) 20 MG tablet Take 20 mg by mouth daily. 09/29/14   [provider]  metoprolol tartrate (LOPRESSOR) 25 MG tablet Take 1 tablet (25 mg total) by mouth 2 (two) times daily. 10/16/20   Arnoldo Lenis, MD  Multiple Vitamin (MULTIVITAMIN) tablet Take 1 tablet by mouth daily.    [provider]  niacin  (NIASPAN) 500 MG CR tablet Take 500 mg by mouth daily. 09/29/14   [provider]  valACYclovir (VALTREX) 500 MG tablet Take 500 mg by mouth daily. 03/29/19   [provider]    Allergies    Patient has no known allergies.  Review of Systems   Review of Systems  Constitutional:  Positive for appetite change and chills. Negative for fever.  HENT:  Positive for congestion. Negative for sore throat.   Respiratory:  Positive for cough and chest tightness. Negative for shortness of breath.   Cardiovascular:  Negative for chest pain.  Gastrointestinal:  Negative for abdominal pain, nausea and vomiting.  Genitourinary:  Negative for enuresis.  Musculoskeletal:  Negative for back pain and myalgias.  Skin:  Negative for rash.  Neurological:  Negative for dizziness and headaches.  Hematological:  Does not bruise/bleed easily.   Physical Exam Updated Vital Signs BP 123/66    Pulse 72    Temp 97.7 F (36.5 C) (Oral)    Resp 20    Ht 6\' 1"  (1.854 m)    Wt 81.8 kg    SpO2 97%    BMI 23.79 kg/m   Physical Exam Vitals and nursing note reviewed.  Constitutional:      General: He is not in acute distress.    Appearance: He is not ill-appearing.  HENT:     Head: Normocephalic and atraumatic.     Nose: Congestion present.     Mouth/Throat:     Mouth: Mucous membranes are moist.     Pharynx: Oropharynx is clear. No oropharyngeal exudate or posterior oropharyngeal erythema.  Eyes:     Conjunctiva/sclera: Conjunctivae normal.  Cardiovascular:     Rate and Rhythm: Normal rate and regular rhythm.     Pulses: Normal pulses.     Heart sounds: No murmur heard.   No friction rub. No gallop.  Pulmonary:     Effort: No respiratory distress.     Breath sounds: No wheezing, rhonchi or rales.  Abdominal:     Palpations: Abdomen is soft.     Tenderness: There is no abdominal tenderness. There is no right CVA tenderness or left CVA tenderness.  Musculoskeletal:     Right lower leg: No  edema.     Left lower leg: No edema.  Skin:    General: Skin is warm and dry.  Neurological:     Mental Status: He is alert.  Psychiatric:        Mood and Affect: Mood normal.    ED Results / Procedures / Treatments   Labs (all labs ordered are listed, but only abnormal results are displayed) Labs Reviewed  RESP PANEL BY RT-PCR (FLU A&B, COVID) ARPGX2 - Abnormal; Notable for the following components:      Result Value   Influenza A by PCR POSITIVE (*)    All other components within normal limits  BASIC METABOLIC PANEL - Abnormal; Notable for the following components:   Sodium 134 (*)    Glucose, Bld 112 (*)    Calcium 8.6 (*)    All other components within normal limits  CBC WITH DIFFERENTIAL/PLATELET - Abnormal; Notable for the following components:   WBC 131.3 (*)    Platelets 114 (*)    Lymphs Abs 119.5 (*)    Monocytes Absolute 9.2 (*)    All other components within normal limits  PATHOLOGIST SMEAR REVIEW    EKG None  Radiology DG Chest Portable 1 View  Result Date: 03/15/2021 CLINICAL DATA:  Cough, congestion EXAM: PORTABLE CHEST 1 VIEW COMPARISON:  09/16/2019 FINDINGS: Linear subsegmental atelectasis at the right lung base. Heart is normal size. Left lung clear. No effusions or acute bony abnormality. IMPRESSION: No active disease. Electronically Signed   By: Rolm Baptise M.D.   On: 03/15/2021 19:10    Procedures Procedures   Medications Ordered in ED Medications - No data to display  ED Course  I have reviewed the triage vital signs and the nursing notes.  Pertinent labs & imaging results that were available during my care of the patient were reviewed by me and considered in my medical decision making (see chart for details).    MDM Rules/Calculators/A&P  Initial impression-presents with URI-like symptoms, he is alert, no acute stress, vital signs reassuring. likely  This is a viral URI, will obtain chest x-ray, basic lab work-up  and reassess.  Work-up-CBC shows leukocytosis of 131.3 this is decreased from his baseline, BMP shows sodium 134, glucose 112, calcium 8.6, respiratory panel positive for influenza A.  Chest x-ray unremarkable.  Rule out- Low suspicion for systemic infection as patient is nontoxic-appearing, vital signs reassuring, no obvious source infection noted on exam.  Low suspicion for pneumonia as lung sounds are clear bilaterally, x-ray did not reveal any acute findings.  I have low suspicion for PE as patient denies pleuritic chest pain, shortness of breath, vital signs reassuring nontachypneic, nonhypoxic, afebrile. low suspicion for strep throat as oropharynx was visualized, no erythema or exudates noted.  Low suspicion patient would need  hospitalized due to viral infection or Covid as vital signs reassuring, patient is not in respiratory distress.    Plan-  Influenza A- will recommend symptom management, due to his immunocompromise state will start on antibiotics, Tamiflu, will also provide steroids but have him withhold unless wheezing presents itself, given strict return precautions.   Vital signs have remained stable, no indication for hospital admission.   Patient given at home care as well strict return precautions.  Patient verbalized that they understood agreed to said plan.      Final Clinical Impression(s) / ED Diagnoses Final diagnoses:  Influenza A    Rx / DC Orders ED Discharge Orders          Ordered    doxycycline (VIBRAMYCIN) 100 MG capsule  2 times daily        03/15/21 2141    predniSONE (DELTASONE) 10 MG tablet  Daily        03/15/21 2141    oseltamivir (TAMIFLU) 75 MG capsule  Every 12 hours        03/15/21 2144             Marcello Fennel, PA-C 03/15/21 2149    Sherwood Gambler, MD 03/16/21 2223

## 2021-03-15 NOTE — ED Triage Notes (Signed)
Cough with congestion

## 2021-03-16 LAB — PATHOLOGIST SMEAR REVIEW

## 2021-03-20 DIAGNOSIS — J449 Chronic obstructive pulmonary disease, unspecified: Secondary | ICD-10-CM | POA: Diagnosis not present

## 2021-03-20 DIAGNOSIS — C9110 Chronic lymphocytic leukemia of B-cell type not having achieved remission: Secondary | ICD-10-CM | POA: Diagnosis not present

## 2021-03-20 DIAGNOSIS — E663 Overweight: Secondary | ICD-10-CM | POA: Diagnosis not present

## 2021-03-20 DIAGNOSIS — I10 Essential (primary) hypertension: Secondary | ICD-10-CM | POA: Diagnosis not present

## 2021-03-20 DIAGNOSIS — Z6823 Body mass index (BMI) 23.0-23.9, adult: Secondary | ICD-10-CM | POA: Diagnosis not present

## 2021-03-30 DIAGNOSIS — J449 Chronic obstructive pulmonary disease, unspecified: Secondary | ICD-10-CM | POA: Diagnosis not present

## 2021-03-30 DIAGNOSIS — E782 Mixed hyperlipidemia: Secondary | ICD-10-CM | POA: Diagnosis not present

## 2021-04-09 DIAGNOSIS — J309 Allergic rhinitis, unspecified: Secondary | ICD-10-CM | POA: Diagnosis not present

## 2021-04-09 DIAGNOSIS — Z0001 Encounter for general adult medical examination with abnormal findings: Secondary | ICD-10-CM | POA: Diagnosis not present

## 2021-04-09 DIAGNOSIS — Z08 Encounter for follow-up examination after completed treatment for malignant neoplasm: Secondary | ICD-10-CM | POA: Diagnosis not present

## 2021-04-09 DIAGNOSIS — J449 Chronic obstructive pulmonary disease, unspecified: Secondary | ICD-10-CM | POA: Diagnosis not present

## 2021-04-09 DIAGNOSIS — Z85828 Personal history of other malignant neoplasm of skin: Secondary | ICD-10-CM | POA: Diagnosis not present

## 2021-04-09 DIAGNOSIS — I10 Essential (primary) hypertension: Secondary | ICD-10-CM | POA: Diagnosis not present

## 2021-04-09 DIAGNOSIS — E782 Mixed hyperlipidemia: Secondary | ICD-10-CM | POA: Diagnosis not present

## 2021-04-09 DIAGNOSIS — Z1331 Encounter for screening for depression: Secondary | ICD-10-CM | POA: Diagnosis not present

## 2021-04-09 DIAGNOSIS — Z6823 Body mass index (BMI) 23.0-23.9, adult: Secondary | ICD-10-CM | POA: Diagnosis not present

## 2021-04-09 DIAGNOSIS — C9110 Chronic lymphocytic leukemia of B-cell type not having achieved remission: Secondary | ICD-10-CM | POA: Diagnosis not present

## 2021-04-09 DIAGNOSIS — E663 Overweight: Secondary | ICD-10-CM | POA: Diagnosis not present

## 2021-04-09 DIAGNOSIS — R509 Fever, unspecified: Secondary | ICD-10-CM | POA: Diagnosis not present

## 2021-04-09 DIAGNOSIS — J329 Chronic sinusitis, unspecified: Secondary | ICD-10-CM | POA: Diagnosis not present

## 2021-04-10 DIAGNOSIS — Z0001 Encounter for general adult medical examination with abnormal findings: Secondary | ICD-10-CM | POA: Diagnosis not present

## 2021-04-10 DIAGNOSIS — J449 Chronic obstructive pulmonary disease, unspecified: Secondary | ICD-10-CM | POA: Diagnosis not present

## 2021-04-10 DIAGNOSIS — E559 Vitamin D deficiency, unspecified: Secondary | ICD-10-CM | POA: Diagnosis not present

## 2021-04-10 DIAGNOSIS — C9110 Chronic lymphocytic leukemia of B-cell type not having achieved remission: Secondary | ICD-10-CM | POA: Diagnosis not present

## 2021-04-10 DIAGNOSIS — E782 Mixed hyperlipidemia: Secondary | ICD-10-CM | POA: Diagnosis not present

## 2021-04-10 DIAGNOSIS — Z79899 Other long term (current) drug therapy: Secondary | ICD-10-CM | POA: Diagnosis not present

## 2021-04-10 DIAGNOSIS — I10 Essential (primary) hypertension: Secondary | ICD-10-CM | POA: Diagnosis not present

## 2021-04-10 DIAGNOSIS — E538 Deficiency of other specified B group vitamins: Secondary | ICD-10-CM | POA: Diagnosis not present

## 2021-04-10 DIAGNOSIS — R509 Fever, unspecified: Secondary | ICD-10-CM | POA: Diagnosis not present

## 2021-04-10 DIAGNOSIS — Z125 Encounter for screening for malignant neoplasm of prostate: Secondary | ICD-10-CM | POA: Diagnosis not present

## 2021-04-10 DIAGNOSIS — E7849 Other hyperlipidemia: Secondary | ICD-10-CM | POA: Diagnosis not present

## 2021-04-23 ENCOUNTER — Encounter: Payer: Self-pay | Admitting: Cardiology

## 2021-04-23 ENCOUNTER — Other Ambulatory Visit: Payer: Self-pay

## 2021-04-23 ENCOUNTER — Ambulatory Visit (INDEPENDENT_AMBULATORY_CARE_PROVIDER_SITE_OTHER): Payer: Medicare Other | Admitting: Cardiology

## 2021-04-23 VITALS — BP 118/64 | HR 76 | Ht 73.0 in | Wt 182.0 lb

## 2021-04-23 DIAGNOSIS — R002 Palpitations: Secondary | ICD-10-CM | POA: Diagnosis not present

## 2021-04-23 DIAGNOSIS — I350 Nonrheumatic aortic (valve) stenosis: Secondary | ICD-10-CM | POA: Diagnosis not present

## 2021-04-23 DIAGNOSIS — I471 Supraventricular tachycardia: Secondary | ICD-10-CM

## 2021-04-23 NOTE — Patient Instructions (Addendum)
Follow-Up: Follow up with Dr. Harl Bowie in 6 months.  If you need a refill on your cardiac medications before your next appointment, please call your pharmacy.

## 2021-04-23 NOTE — Progress Notes (Signed)
Clinical Summary Mr. Dimmer is a 71 y.o.maleseen today for follow up of the following medical problems.      1. Papitations - long history of symtpoms.  -2017 monitor with just PACs   - noted from tele lead during 07/2019 echo, rates 130s narrow complex regular - 07/2019 7 day event monitor without signficant arrhythmias - 09/14/2019 EKG shows SVT     - last visit we increased lopressor to 25mg  bid - rare episodes of palpitations since last visit.      2.Possible Bicuspid aortic valve/ Aortic stenosis - somewhat questionable on prior imaging - CTA chest 12/2004 without significant aortopathy.  - 2019 echo LVEF 60-65%, mild to mod AS (mean grad 17, AVA VTI 1.4)     07/2019 echo: LVEF 55-60%, mild to mod AS - no recent SOB/DOE   3. CLL - followed at cancer center      4. COPD - admit 05/2019 with COPD exacerbation, pneumonia     5. AAA screen - had prior CT 12/2015 A/P for flank pain, no aneurysm noted           Past Medical History:  Diagnosis Date   Aortic valve disorder    Mild insufficiency   Chronic lymphocytic leukemia ([place])    01/2012: WBC-90.2, H&H-15.1/45.3, platelets-185 03/31/12: Verified with Dr. Tressie Stalker that no precautions or modification of medical regime are required prior to orthopaedic surgery.    CLL (chronic lymphocytic leukemia) (HCC)    CMV (cytomegalovirus) (HCC)    COPD (chronic obstructive pulmonary disease) (HCC)    Depression with anxiety    DJD (degenerative joint disease)    GERD (gastroesophageal reflux disease)    Hyperlipidemia    Hypogammaglobulinemia ([place]) 09/23/2019   Lipoma    left chest wall   Palpitations 2004   PVCs; borderline stress nuclear in 2004, negative in 2007; Echo 2007; AV-sclerotic, very mild AI   Pneumonia    Right bundle Jaide [place] block    + left posterior fascicular block   Tobacco abuse    80 pack years     No Known Allergies   Current Outpatient Medications  Medication Sig Dispense Refill    albuterol (PROVENTIL HFA;VENTOLIN HFA) 108 (90 BASE) MCG/ACT inhaler Inhale 2 puffs into the lungs every 6 (six) hours as needed for wheezing or shortness of breath.      ALPRAZolam (XANAX) 1 MG tablet Take 1 mg by mouth 3 (three) times daily as needed for anxiety.      aspirin 325 MG tablet Take 325 mg by mouth daily.     buPROPion (WELLBUTRIN SR) 150 MG 12 hr tablet Take 150 mg by mouth daily.     calcium carbonate (OS-CAL) 600 MG TABS Take 600 mg by mouth daily.     celecoxib (CELEBREX) 100 MG capsule Take 100 mg by mouth 2 (two) times daily.     clotrimazole-betamethasone (LOTRISONE) cream Apply 1 application topically daily as needed. Affected area     diltiazem (CARDIZEM) 30 MG tablet Take 30 mg by mouth as needed.     fish oil-omega-3 fatty acids 1000 MG capsule Take 1 g by mouth daily.     fluticasone (FLONASE) 50 MCG/ACT nasal spray Place 2 sprays into the nose daily.     HYDROcodone-acetaminophen (NORCO/VICODIN) 5-325 MG per tablet Take 1 tablet by mouth every 6 (six) hours as needed for moderate pain.      Loratadine 10 MG CAPS Take 10 mg by mouth daily.  lovastatin (MEVACOR) 20 MG tablet Take 20 mg by mouth daily.     metoprolol tartrate (LOPRESSOR) 25 MG tablet Take 1 tablet (25 mg total) by mouth 2 (two) times daily. 180 tablet 3   Multiple Vitamin (MULTIVITAMIN) tablet Take 1 tablet by mouth daily.     niacin (NIASPAN) 500 MG CR tablet Take 500 mg by mouth daily.     oseltamivir (TAMIFLU) 75 MG capsule Take 1 capsule (75 mg total) by mouth every 12 (twelve) hours. 10 capsule 0   predniSONE (DELTASONE) 10 MG tablet Take 3 tablets (30 mg total) by mouth daily. 15 tablet 0   valACYclovir (VALTREX) 500 MG tablet Take 500 mg by mouth daily.     No current facility-administered medications for this visit.     Past Surgical History:  Procedure Laterality Date   COLONOSCOPY  01/2012   Negative screening study   GANGLION CYST EXCISION  1997   left wrist   ROTATOR CUFF REPAIR   1997   right   SHOULDER ARTHROSCOPY WITH SUBACROMIAL DECOMPRESSION  04/09/2012   Procedure: SHOULDER ARTHROSCOPY WITH SUBACROMIAL DECOMPRESSION;  Surgeon: Ninetta Lights, MD;  Location: [facility];  Service: Orthopedics;  Laterality: Left;  LEFT SHOULDER ARTHROSCOPY, SUBACROMIAL DECOMPRESSION, PARTIAL ACROMIOPLASTY WITH CORACOACROMIAL RELEASE, DISTAL CLAVICULECTOMY WITH ROTATOR CUFF REPAIR, DEBRIDEMENT OF LABRUM     No Known Allergies    Family History  Problem Relation Age of Onset   Stroke Mother    Heart attack Father    Cancer Sister        colon   Colon cancer Sister    Cancer Brother        2 brothers died with lung cancer     Social History Mr. Feltes reports that he has been smoking cigarettes. He started smoking about 57 years ago. He has a 45.00 pack-year smoking history. He has never used smokeless tobacco. Mr. Maziarz reports no history of alcohol use.   Review of Systems CONSTITUTIONAL: No weight loss, fever, chills, weakness or fatigue.  HEENT: Eyes: No visual loss, blurred vision, double vision or yellow sclerae.No hearing loss, sneezing, congestion, runny nose or sore throat.  SKIN: No rash or itching.  CARDIOVASCULAR: per hpi RESPIRATORY: No shortness of breath, cough or sputum.  GASTROINTESTINAL: No anorexia, nausea, vomiting or diarrhea. No abdominal pain or blood.  GENITOURINARY: No burning on urination, no polyuria NEUROLOGICAL: No headache, dizziness, syncope, paralysis, ataxia, numbness or tingling in the extremities. No change in bowel or bladder control.  MUSCULOSKELETAL: No muscle, back pain, joint pain or stiffness.  LYMPHATICS: No enlarged nodes. No history of splenectomy.  PSYCHIATRIC: No history of depression or anxiety.  ENDOCRINOLOGIC: No reports of sweating, cold or heat intolerance. No polyuria or polydipsia.  [name]   Physical Examination Today's Vitals   04/23/21 1107  BP: (!) 96/54  Pulse: 76  SpO2: 97%  Weight: 182 lb  (82.6 kg)  Height: 6\' 1"  (1.854 m)   Body mass index is 24.01 kg/m.  Gen: resting comfortably, no acute distress HEENT: no scleral icterus, pupils equal round and reactive, no palptable cervical adenopathy,  CV: RRR, 2/6 systolic murmur rusb, no jvd Resp: Clear to auscultation bilaterally GI: abdomen is soft, non-tender, non-distended, normal bowel sounds, no hepatosplenomegaly MSK: extremities are warm, no edema.  Skin: warm, no rash Neuro:  no focal deficits Psych: appropriate affect   Diagnostic Studies Jan 2021 carotid US IMPRESSION: 1. Bilateral carotid bifurcation plaque resulting in less than 50% diameter ICA  stenosis. 2. Antegrade bilateral vertebral arterial flow.   07/2019 echo IMPRESSIONS     1. Left ventricular ejection fraction, by estimation, is 55 to 60%. The  left ventricle has normal function. The left ventricle has no regional  wall motion abnormalities. Left ventricular diastolic parameters are  indeterminate.   2. Tachycardia with heart rate 135 noted near beginning of study. Same  QRS morphology and no obvious P waves - question SVT, possibly atrial  flutter.   3. Right ventricular systolic function is normal. The right ventricular  size is normal. Tricuspid regurgitation signal is inadequate for assessing  PA pressure.   4. The mitral valve is grossly normal, mild annular calcification.  Trivial mitral valve regurgitation.   5. The aortic valve is likely tricuspid, moderately calcified and not  well visualized. Cusp excursion is limited. Aortic valve regurgitation is  mild. Aortic valve mean gradient measures 12.0 mmHg. Appears that LVOT VTI  may be overestimated and the peak  AV velocity is higher than that recorded. Cannot be conclusive about  degree of aortic stenosis, but suspect moderate. Consider limited study  with further interrogation, particularly if heart rate and rhythm are in  better control.    Assessment and Plan   1.  Palpitaitons/PSVT - previous monitor with just occasional PACs, no significant arrhythmias. Did catch an episode of SVT during his echo and by 09/14/2019 EKG - rare symptoms since last visit, continue lopressor 25mg  bid and prn diltiazem.      2.  Aortic stenosis - mild to mod AS by last echo - no symptoms.  - will repeat echo after our next visit  F/u 6 months   Arnoldo Lenis, M.D

## 2021-06-04 ENCOUNTER — Inpatient Hospital Stay (HOSPITAL_COMMUNITY): Payer: Medicare Other | Attending: Hematology

## 2021-06-04 ENCOUNTER — Encounter: Payer: Self-pay | Admitting: Cardiology

## 2021-06-04 DIAGNOSIS — Z79899 Other long term (current) drug therapy: Secondary | ICD-10-CM | POA: Diagnosis not present

## 2021-06-04 DIAGNOSIS — F17210 Nicotine dependence, cigarettes, uncomplicated: Secondary | ICD-10-CM | POA: Insufficient documentation

## 2021-06-04 DIAGNOSIS — M5442 Lumbago with sciatica, left side: Secondary | ICD-10-CM | POA: Diagnosis not present

## 2021-06-04 DIAGNOSIS — D801 Nonfamilial hypogammaglobulinemia: Secondary | ICD-10-CM | POA: Insufficient documentation

## 2021-06-04 DIAGNOSIS — C9110 Chronic lymphocytic leukemia of B-cell type not having achieved remission: Secondary | ICD-10-CM | POA: Diagnosis not present

## 2021-06-04 DIAGNOSIS — M5416 Radiculopathy, lumbar region: Secondary | ICD-10-CM | POA: Diagnosis not present

## 2021-06-04 DIAGNOSIS — M4310 Spondylolisthesis, site unspecified: Secondary | ICD-10-CM | POA: Diagnosis not present

## 2021-06-04 DIAGNOSIS — G8929 Other chronic pain: Secondary | ICD-10-CM | POA: Diagnosis not present

## 2021-06-04 DIAGNOSIS — M5136 Other intervertebral disc degeneration, lumbar region: Secondary | ICD-10-CM | POA: Diagnosis not present

## 2021-06-04 LAB — COMPREHENSIVE METABOLIC PANEL
ALT: 14 U/L (ref 0–44)
AST: 19 U/L (ref 15–41)
Albumin: 4.2 g/dL (ref 3.5–5.0)
Alkaline Phosphatase: 126 U/L (ref 38–126)
Anion gap: 6 (ref 5–15)
BUN: 23 mg/dL (ref 8–23)
CO2: 26 mmol/L (ref 22–32)
Calcium: 9.1 mg/dL (ref 8.9–10.3)
Chloride: 111 mmol/L (ref 98–111)
Creatinine, Ser: 1.06 mg/dL (ref 0.61–1.24)
GFR, Estimated: >60 mL/min (ref >60)
Glucose, Bld: 103 mg/dL — ABNORMAL HIGH (ref 70–99)
Potassium: 5.4 mmol/L — ABNORMAL HIGH (ref 3.5–5.1)
Sodium: 143 mmol/L (ref 135–145)
Total Bilirubin: 0.4 mg/dL (ref 0.3–1.2)
Total Protein: 6.6 g/dL (ref 6.5–8.1)

## 2021-06-04 LAB — CBC WITH DIFFERENTIAL/PLATELET
Basophils Absolute: 0 10*3/uL (ref 0.0–0.1)
Basophils Relative: 0 %
Eosinophils Absolute: 1.4 10*3/uL — ABNORMAL HIGH (ref 0.0–0.5)
Eosinophils Relative: 1 %
HCT: 46.5 % (ref 39.0–52.0)
Hemoglobin: 14.4 g/dL (ref 13.0–17.0)
Lymphocytes Relative: 82 %
Lymphs Abs: 117.5 10*3/uL — ABNORMAL HIGH (ref 0.7–4.0)
MCH: 30.8 pg (ref 26.0–34.0)
MCHC: 31 g/dL (ref 30.0–36.0)
MCV: 99.4 fL (ref 80.0–100.0)
Monocytes Absolute: 7.2 10*3/uL — ABNORMAL HIGH (ref 0.1–1.0)
Monocytes Relative: 5 %
Neutro Abs: 17.2 10*3/uL — ABNORMAL HIGH (ref 1.7–7.7)
Neutrophils Relative %: 12 %
Platelets: 142 10*3/uL — ABNORMAL LOW (ref 150–400)
RBC: 4.68 MIL/uL (ref 4.22–5.81)
RDW: 13.8 % (ref 11.5–15.5)
WBC: 143.3 10*3/uL (ref 4.0–10.5)
nRBC: 0 % (ref 0.0–0.2)

## 2021-06-04 LAB — LACTATE DEHYDROGENASE: LDH: 133 U/L (ref 98–192)

## 2021-06-04 NOTE — Progress Notes (Unsigned)
CRITICAL VALUE ALERT ?Critical value received:  WBC 143.3 ?Date of notification:  06/04/2021 ?Time of notification: 13:56 pm ?Critical value read back:  Yes.   ?Nurse who received alert:  B. Brayant Dorr RN ?MD notified time and response:  Dr. Delton Coombes / C. Edwards LPN.   ?

## 2021-06-05 LAB — IGG, IGA, IGM
IgA: 27 mg/dL — ABNORMAL LOW (ref 61–437)
IgG (Immunoglobin G), Serum: 382 mg/dL — ABNORMAL LOW (ref 603–1613)
IgM (Immunoglobulin M), Srm: 10 mg/dL — ABNORMAL LOW (ref 20–172)

## 2021-06-11 ENCOUNTER — Other Ambulatory Visit: Payer: Self-pay

## 2021-06-11 ENCOUNTER — Inpatient Hospital Stay (HOSPITAL_BASED_OUTPATIENT_CLINIC_OR_DEPARTMENT_OTHER): Payer: Medicare Other | Admitting: Hematology

## 2021-06-11 VITALS — BP 134/67 | HR 71 | Temp 97.5°F | Resp 18 | Ht 73.0 in | Wt 182.5 lb

## 2021-06-11 DIAGNOSIS — F17210 Nicotine dependence, cigarettes, uncomplicated: Secondary | ICD-10-CM | POA: Diagnosis not present

## 2021-06-11 DIAGNOSIS — D801 Nonfamilial hypogammaglobulinemia: Secondary | ICD-10-CM | POA: Diagnosis not present

## 2021-06-11 DIAGNOSIS — C9110 Chronic lymphocytic leukemia of B-cell type not having achieved remission: Secondary | ICD-10-CM

## 2021-06-11 DIAGNOSIS — Z6824 Body mass index (BMI) 24.0-24.9, adult: Secondary | ICD-10-CM | POA: Diagnosis not present

## 2021-06-11 DIAGNOSIS — Z79899 Other long term (current) drug therapy: Secondary | ICD-10-CM | POA: Diagnosis not present

## 2021-06-11 DIAGNOSIS — H6121 Impacted cerumen, right ear: Secondary | ICD-10-CM | POA: Diagnosis not present

## 2021-06-11 DIAGNOSIS — E663 Overweight: Secondary | ICD-10-CM | POA: Diagnosis not present

## 2021-06-11 NOTE — Progress Notes (Signed)
Alfred Brown, [place] [id number]   CLINIC:  Medical Oncology/Hematology  PCP:  [facility], [address] / [place] [id number] [phone]   REASON FOR VISIT:  Follow-up for CLL  PRIOR THERAPY: none  NGS Results: not done  CURRENT THERAPY: Intermittent IVIG last on 06/03/2019  BRIEF ONCOLOGIC HISTORY:  Oncology History   No history exists.    CANCER STAGING:  Cancer Staging  No matching staging information was found for the patient.  INTERVAL HISTORY:  Alfred Brown, a 71 y.o. male, returns for routine follow-up of his CLL. Alfred Brown was last seen on 11/27/2020.   Today he reports feeling good. He reports an influenza infection in December 2022 and a sinus infection. He denies fevers, night sweats, and weight loss. He is taking a multivitamin.   REVIEW OF SYSTEMS:  Review of Systems  Constitutional:  Negative for appetite change, fatigue, fever and unexpected weight change.  Respiratory:  Positive for cough.   Cardiovascular:  Positive for palpitations.  Endocrine: Negative for hot flashes.  Musculoskeletal:  Positive for back pain (L).  All other systems reviewed and are negative.  PAST MEDICAL/SURGICAL HISTORY:  Past Medical History:  Diagnosis Date   Aortic valve disorder    Mild insufficiency   Chronic lymphocytic leukemia ([place])    01/2012: WBC-90.2, H&H-15.1/45.3, platelets-185 03/31/12: Verified with Dr. Tressie Stalker that no precautions or modification of medical regime are required prior to orthopaedic surgery.    CLL (chronic lymphocytic leukemia) (HCC)    CMV (cytomegalovirus) (HCC)    COPD (chronic obstructive pulmonary disease) (HCC)    Depression with anxiety    DJD (degenerative joint disease)    GERD (gastroesophageal reflux disease)    Hyperlipidemia    Hypogammaglobulinemia ([place]) 09/23/2019   Lipoma    left chest wall   Palpitations 2004   PVCs; borderline stress nuclear in 2004, negative in  2007; Echo 2007; AV-sclerotic, very mild AI   Pneumonia    Right bundle branch block    + left posterior fascicular block   Tobacco abuse    80 pack years   Past Surgical History:  Procedure Laterality Date   COLONOSCOPY  01/2012   Negative screening study   GANGLION CYST EXCISION  1997   left wrist   ROTATOR CUFF REPAIR  1997   right   SHOULDER ARTHROSCOPY WITH SUBACROMIAL DECOMPRESSION  04/09/2012   Procedure: SHOULDER ARTHROSCOPY WITH SUBACROMIAL DECOMPRESSION;  Surgeon: Ninetta Lights, MD;  Location: [facility];  Service: Orthopedics;  Laterality: Left;  LEFT SHOULDER ARTHROSCOPY, SUBACROMIAL DECOMPRESSION, PARTIAL ACROMIOPLASTY WITH CORACOACROMIAL RELEASE, DISTAL CLAVICULECTOMY WITH ROTATOR CUFF REPAIR, DEBRIDEMENT OF LABRUM    SOCIAL HISTORY:  Social History   Socioeconomic History   Marital status: Married    Spouse name: Not on file   Number of children: Not on file   Years of education: Not on file   Highest education level: Not on file  Occupational History   Not on file  Tobacco Use   Smoking status: Every Day    Packs/day: 1.00    Years: 45.00    Pack years: 45.00    Types: Cigarettes    Start date: 12/03/1963   Smokeless tobacco: Never  Substance and Sexual Activity   Alcohol use: No    Alcohol/week: 0.0 standard drinks   Drug use: No   Sexual activity: Not on file  Other Topics Concern   Not on file  Social  History Narrative   Not on file   Social Determinants of Health   Financial Resource Strain: Not on file  Food Insecurity: Not on file  Transportation Needs: Not on file  Physical Activity: Not on file  Stress: Not on file  Social Connections: Not on file  Intimate Partner Violence: Not on file    FAMILY HISTORY:  Family History  Problem Relation Age of Onset   Stroke Mother    Heart attack Father    Cancer Sister        colon   Colon cancer Sister    Cancer Brother        2 brothers died with lung cancer    CURRENT  MEDICATIONS:  Current Outpatient Medications  Medication Sig Dispense Refill   albuterol (PROVENTIL HFA;VENTOLIN HFA) 108 (90 BASE) MCG/ACT inhaler Inhale 2 puffs into the lungs every 6 (six) hours as needed for wheezing or shortness of breath.      ALPRAZolam (XANAX) 1 MG tablet Take 1 mg by mouth 3 (three) times daily as needed for anxiety.      aspirin 325 MG tablet Take 325 mg by mouth daily.     buPROPion (WELLBUTRIN SR) 150 MG 12 hr tablet Take 150 mg by mouth daily.     calcium carbonate (OS-CAL) 600 MG TABS Take 600 mg by mouth daily.     celecoxib (CELEBREX) 100 MG capsule Take 100 mg by mouth 2 (two) times daily.     clotrimazole-betamethasone (LOTRISONE) cream Apply 1 application topically daily as needed. Affected area     diltiazem (CARDIZEM) 30 MG tablet Take 30 mg by mouth as needed.     fish oil-omega-3 fatty acids 1000 MG capsule Take 1 g by mouth daily.     fluticasone (FLONASE) 50 MCG/ACT nasal spray Place 2 sprays into the nose daily.     HYDROcodone-acetaminophen (NORCO/VICODIN) 5-325 MG per tablet Take 1 tablet by mouth every 6 (six) hours as needed for moderate pain.      Loratadine 10 MG CAPS Take 10 mg by mouth daily.     lovastatin (MEVACOR) 20 MG tablet Take 20 mg by mouth daily.     metoprolol tartrate (LOPRESSOR) 25 MG tablet Take 1 tablet (25 mg total) by mouth 2 (two) times daily. 180 tablet 3   Multiple Vitamin (MULTIVITAMIN) tablet Take 1 tablet by mouth daily.     niacin (NIASPAN) 500 MG CR tablet Take 500 mg by mouth daily.     valACYclovir (VALTREX) 1000 MG tablet Take 4,000 mg by mouth as needed.     valACYclovir (VALTREX) 500 MG tablet Take 500 mg by mouth daily.     No current facility-administered medications for this visit.    ALLERGIES:  No Known Allergies  PHYSICAL EXAM:  Performance status (ECOG): 1 - Symptomatic but completely ambulatory  Vitals:   06/11/21 1515  BP: 134/67  Pulse: 71  Resp: 18  Temp: (!) 97.5 F (36.4 C)  SpO2: 97%    Wt Readings from Last 3 Encounters:  06/11/21 182 lb 8 oz (82.8 kg)  04/23/21 182 lb (82.6 kg)  03/15/21 180 lb 5.4 oz (81.8 kg)   Physical Exam Vitals reviewed.  Constitutional:      Appearance: Normal appearance.  Cardiovascular:     Rate and Rhythm: Normal rate and regular rhythm.     Pulses: Normal pulses.     Heart sounds: Normal heart sounds.  Pulmonary:     Effort: Pulmonary effort is normal.  Breath sounds: Normal breath sounds.  Abdominal:     Palpations: Abdomen is soft. There is no hepatomegaly, splenomegaly or mass.     Tenderness: There is no abdominal tenderness.  Lymphadenopathy:     Cervical: No cervical adenopathy.     Right cervical: No superficial, deep or posterior cervical adenopathy.    Left cervical: No superficial, deep or posterior cervical adenopathy.     Upper Body:     Right upper body: No supraclavicular or axillary adenopathy.     Left upper body: No supraclavicular or axillary adenopathy.     Lower Body: Right inguinal adenopathy present. No left inguinal adenopathy.  Neurological:     General: No focal deficit present.     Mental Status: He is alert and oriented to person, place, and time.  Psychiatric:        Mood and Affect: Mood normal.        Behavior: Behavior normal.     LABORATORY DATA:  I have reviewed the labs as listed.  CBC Latest Ref Rng & Units 06/04/2021 03/15/2021 11/20/2020  WBC 4.0 - 10.5 K/uL 143.3(HH) 131.3(HH) 150.9(HH)  Hemoglobin 13.0 - 17.0 g/dL 14.4 14.3 14.6  Hematocrit 39.0 - 52.0 % 46.5 45.6 46.4  Platelets 150 - 400 K/uL 142(L) 114(L) 141(L)   CMP Latest Ref Rng & Units 06/04/2021 03/15/2021 11/20/2020  Glucose 70 - 99 mg/dL 103(H) 112(H) 108(H)  BUN 8 - 23 mg/dL 23 23 21   Creatinine 0.61 - 1.24 mg/dL 1.06 1.04 1.27(H)  Sodium 135 - 145 mmol/L 143 134(L) 140  Potassium 3.5 - 5.1 mmol/L 5.4(H) 5.1 4.9  Chloride 98 - 111 mmol/L 111 103 107  CO2 22 - 32 mmol/L 26 25 29   Calcium 8.9 - 10.3 mg/dL 9.1 8.6(L)  8.8(L)  Total Protein 6.5 - 8.1 g/dL 6.6 - 6.3(L)  Total Bilirubin 0.3 - 1.2 mg/dL 0.4 - 0.7  Alkaline Phos 38 - 126 U/L 126 - 116  AST 15 - 41 U/L 19 - 24  ALT 0 - 44 U/L 14 - 18    DIAGNOSTIC IMAGING:  I have independently reviewed the scans and discussed with the patient. No results found.   ASSESSMENT:  1.  Stage II CLL by [facility] system: -Diagnosed around 2010, observation since then. -Denies any fevers, night sweats or weight loss in the last 6 months. -Patient was diagnosed with COVID-19 on 05/09/2019.  He was also admitted in March for IVIG infusion.     2.  Hypogammaglobulinemia: -This is from underlying CLL.  Trough immunoglobulins are low at 3 50-400.  He did not have any recurrent infections. -However because of his recent COVID-19 infection, he received IVIG 400 mg/kg 1 dose on 06/03/2019.   3.  Shortness of breath on exertion: -he reports shortness of breath on exertion which has not improved since recent hospitalization. -I have done chest x-ray in the office today and reviewed it and compare it from chest x-ray from June 02, 2019.  Infiltrates have slightly improved.   PLAN:  1.  Stage II CLL by Rai system: - He does not report any fevers, night sweats or weight loss in the last 3 months. - Subcentimeter lymph nodes in the right groin stable.  No other significant adenopathy or splenomegaly elsewhere. - Reviewed labs from 06/04/2021.  White count is 143, hemoglobin 14.4 and platelet count 142.  LDH is normal.  LFTs are normal. - No indication for treatment at this time.  We will monitor in 6  months with repeat labs.   2.  Hypogammaglobulinemia: - He had flu in December and a sinus infection prior to that.  No other infections. - Reviewed immunoglobulin levels.  IgG trough was 382.  If there is any significant increase in recurrent infections, will consider parenteral IVIG.   Orders placed this encounter:  No orders of the defined types were placed in this  encounter.    Derek Jack, MD [facility] [phone]   I, Thana Ates, am acting as a scribe for Dr. Derek Jack.  I, Derek Jack MD, have reviewed the above documentation for accuracy and completeness, and I agree with the above.

## 2021-06-11 NOTE — Patient Instructions (Signed)
[facility] at [facility] ?Discharge Instructions ? ?You were seen and examined today by Dr. Delton Coombes. He reviewed your most recent labs and everything looks okay. Please keep follow up appointments as scheduled in 6 months. ? ? ?Thank you for choosing [place] at [facility] to provide your oncology and hematology care.  To afford each patient quality time with our provider, please arrive at least 15 minutes before your scheduled appointment time.  ? ?If you have a lab appointment with the [facility] please come in thru the Main Entrance and check in at the main information desk. ? ?You need to re-schedule your appointment should you arrive 10 or more minutes late.  We strive to give you quality time with our providers, and arriving late affects you and other patients whose appointments are after yours.  Also, if you no show three or more times for appointments you may be dismissed from the clinic at the providers discretion.     ?Again, thank you for choosing [facility].  Our hope is that these requests will decrease the amount of time that you wait before being seen by our physicians.       ?_____________________________________________________________ ? ?Should you have questions after your visit to [facility], please contact our office at [phone] and follow the prompts.  Our office hours are 8:00 a.m. and 4:30 p.m. Monday - Friday.  Please note that voicemails left after 4:00 p.m. may not be returned until the following business day.  We are closed weekends and major holidays.  You do have access to a nurse 24-7, just call the main number to the clinic [phone] and do not press any options, hold on the line and a nurse will answer the phone.   ? ?For prescription refill requests, have your pharmacy contact our office and allow 72 hours.   ? ?Due to Covid, you will need to wear a mask upon entering the hospital. If you do  not have a mask, a mask will be given to you at the Main Entrance upon arrival. For doctor visits, patients may have 1 support person age 27 or older with them. For treatment visits, patients can not have anyone with them due to social distancing guidelines and our immunocompromised population.  ? ?  ?

## 2021-06-28 DIAGNOSIS — M5416 Radiculopathy, lumbar region: Secondary | ICD-10-CM | POA: Diagnosis not present

## 2021-08-20 DIAGNOSIS — Z08 Encounter for follow-up examination after completed treatment for malignant neoplasm: Secondary | ICD-10-CM | POA: Diagnosis not present

## 2021-08-20 DIAGNOSIS — L570 Actinic keratosis: Secondary | ICD-10-CM | POA: Diagnosis not present

## 2021-08-20 DIAGNOSIS — Z85828 Personal history of other malignant neoplasm of skin: Secondary | ICD-10-CM | POA: Diagnosis not present

## 2021-08-20 DIAGNOSIS — X32XXXD Exposure to sunlight, subsequent encounter: Secondary | ICD-10-CM | POA: Diagnosis not present

## 2021-08-27 ENCOUNTER — Ambulatory Visit
Admission: EM | Admit: 2021-08-27 | Discharge: 2021-08-27 | Disposition: A | Discharge status: Home / Self Care | Payer: Medicare Other | Attending: Family Medicine | Admitting: Family Medicine

## 2021-08-27 ENCOUNTER — Ambulatory Visit (INDEPENDENT_AMBULATORY_CARE_PROVIDER_SITE_OTHER): Payer: Medicare Other

## 2021-08-27 DIAGNOSIS — J0100 Acute maxillary sinusitis, unspecified: Secondary | ICD-10-CM | POA: Diagnosis not present

## 2021-08-27 DIAGNOSIS — R059 Cough, unspecified: Secondary | ICD-10-CM | POA: Diagnosis not present

## 2021-08-27 MED ORDER — AMOXICILLIN-POT CLAVULANATE 875-125 MG PO TABS: 1.0000 | ORAL_TABLET | Freq: Two times a day (BID) | ORAL | 0 refills | Status: AC

## 2021-08-27 NOTE — ED Triage Notes (Signed)
Pt presents with  c/o cough and nasal congestion that first began 3 weeks ago , became worse this weekend

## 2021-08-27 NOTE — ED Provider Notes (Signed)
[facility]    CSN: [id number] Arrival date & time: 08/27/21  1101      History   Chief Complaint Chief Complaint  Patient presents with   Nasal Congestion   Cough    HPI ACE BERGFELD is a 71 y.o. male.   Patient presents with wife.  Patient provides history and reports ongoing cough for the past 3 weeks.  Initially started as a dry cough, however now it has turned congested and productive.  He is coughing up thick, white phlegm.  He thinks there is sinus drainage is draining down into the back of his throat.  He reports wheezing right before he starts coughing, however no wheezing with activity, no worsening shortness of breath.  He denies fevers, body aches, chills.  Denies chest pain or tightness, sore throat, headache, ear pain or pressure, nausea/vomiting, diarrhea, change in appetite.  He reports he is very tired, however this is at his baseline as he has a history of CLL.  He has taken Mucinex, loratadine, Nasonex without relief of symptoms.  He has not needed to use albuterol inhaler.  Patient's wife reports recent history of pneumonia and is concerned for this.  Reports last antibiotic use was more than 3 months ago.  No recent corticosteroid use.     Patient follows with specialist including oncologist and cardiologist regularly.  Past Medical History:  Diagnosis Date   Aortic valve disorder    Mild insufficiency   Chronic lymphocytic leukemia ([place])    01/2012: WBC-90.2, H&H-15.1/45.3, platelets-185 03/31/12: Verified with Dr. Tressie Stalker that no precautions or modification of medical regime are required prior to orthopaedic surgery.    CLL (chronic lymphocytic leukemia) (HCC)    CMV (cytomegalovirus) (HCC)    COPD (chronic obstructive pulmonary disease) (HCC)    Depression with anxiety    DJD (degenerative joint disease)    GERD (gastroesophageal reflux disease)    Hyperlipidemia    Hypogammaglobulinemia ([place]) 09/23/2019   Lipoma    left chest wall    Palpitations 2004   PVCs; borderline stress nuclear in 2004, negative in 2007; Echo 2007; AV-sclerotic, very mild AI   Pneumonia    Right bundle branch block    + left posterior fascicular block   Tobacco abuse    80 pack years    Patient Active Problem List   Diagnosis Date Noted   Peyronie's disease 11/24/2019   Hypogammaglobulinemia ([place]) 09/23/2019   COPD GOLD 0 08/05/2019   COPD exacerbation ([place]) 06/03/2019   Acute and chronic respiratory failure with hypoxia ([place]) 06/03/2019   History of COVID-19 dx 05-09-19 06/03/2019   Pneumonia due to COVID-19 virus 06/02/2019   GERD (gastroesophageal reflux disease)    Hyperlipidemia    Palpitations    Chronic lymphocytic leukemia ([place])    Aortic valve disorder    Right bundle branch block    Tobacco abuse     Past Surgical History:  Procedure Laterality Date   COLONOSCOPY  01/2012   Negative screening study   GANGLION CYST EXCISION  1997   left wrist   ROTATOR CUFF REPAIR  1997   right   SHOULDER ARTHROSCOPY WITH SUBACROMIAL DECOMPRESSION  04/09/2012   Procedure: SHOULDER ARTHROSCOPY WITH SUBACROMIAL DECOMPRESSION;  Surgeon: Ninetta Lights, MD;  Location: [facility];  Service: Orthopedics;  Laterality: Left;  LEFT SHOULDER ARTHROSCOPY, SUBACROMIAL DECOMPRESSION, PARTIAL ACROMIOPLASTY WITH CORACOACROMIAL RELEASE, DISTAL CLAVICULECTOMY WITH ROTATOR CUFF REPAIR, DEBRIDEMENT OF LABRUM       Home Medications  Prior to Admission medications   Medication Sig Start Date End Date Taking? Authorizing Provider  amoxicillin-clavulanate (AUGMENTIN) 875-125 MG tablet Take 1 tablet by mouth 2 (two) times daily for 7 days. 08/27/21 09/03/21 Yes Eulogio Bear, NP  albuterol (PROVENTIL HFA;VENTOLIN HFA) 108 (90 BASE) MCG/ACT inhaler Inhale 2 puffs into the lungs every 6 (six) hours as needed for wheezing or shortness of breath.     [provider]  ALPRAZolam Duanne Moron) 1 MG tablet Take 1 mg by mouth 3 (three) times  daily as needed for anxiety.     [provider]  aspirin 325 MG tablet Take 325 mg by mouth daily.    [provider]  buPROPion (WELLBUTRIN SR) 150 MG 12 hr tablet Take 150 mg by mouth daily.    [provider]  calcium carbonate (OS-CAL) 600 MG TABS Take 600 mg by mouth daily.    [provider]  celecoxib (CELEBREX) 100 MG capsule Take 100 mg by mouth 2 (two) times daily.    [provider]  clotrimazole-betamethasone (LOTRISONE) cream Apply 1 application topically daily as needed. Affected area 06/19/15   [provider]  diltiazem (CARDIZEM) 30 MG tablet Take 30 mg by mouth as needed.    [provider]  fish oil-omega-3 fatty acids 1000 MG capsule Take 1 g by mouth daily.    [provider]  fluticasone (FLONASE) 50 MCG/ACT nasal spray Place 2 sprays into the nose daily.    [provider]  HYDROcodone-acetaminophen (NORCO/VICODIN) 5-325 MG per tablet Take 1 tablet by mouth every 6 (six) hours as needed for moderate pain.  08/10/14   [provider]  Loratadine 10 MG CAPS Take 10 mg by mouth daily.    [provider]  lovastatin (MEVACOR) 20 MG tablet Take 20 mg by mouth daily. 09/29/14   [provider]  metoprolol tartrate (LOPRESSOR) 25 MG tablet Take 1 tablet (25 mg total) by mouth 2 (two) times daily. 10/16/20   Arnoldo Lenis, MD  Multiple Vitamin (MULTIVITAMIN) tablet Take 1 tablet by mouth daily.    [provider]  niacin (NIASPAN) 500 MG CR tablet Take 500 mg by mouth daily. 09/29/14   [provider]  valACYclovir (VALTREX) 1000 MG tablet Take 4,000 mg by mouth as needed. 04/09/21   [provider]  valACYclovir (VALTREX) 500 MG tablet Take 500 mg by mouth daily. 03/29/19   [provider]    Family History Family History  Problem Relation Age of Onset   Stroke Mother    Heart attack Father    Cancer Sister        colon   Colon cancer  Sister    Cancer Brother        2 brothers died with lung cancer    Social History Social History   Tobacco Use   Smoking status: Every Day    Packs/day: 1.00    Years: 45.00    Pack years: 45.00    Types: Cigarettes    Start date: 12/03/1963   Smokeless tobacco: Never  Substance Use Topics   Alcohol use: No    Alcohol/week: 0.0 standard drinks   Drug use: No     Allergies   Patient has no known allergies.   Review of Systems Review of Systems Per HPI  Physical Exam Triage Vital Signs ED Triage Vitals  Enc Vitals Group     BP 08/27/21 1123 135/69     Pulse Rate 08/27/21  1123 81     Resp 08/27/21 1123 18     Temp 08/27/21 1123 98 F (36.7 C)     Temp src --      SpO2 08/27/21 1123 96 %     Weight --      Height --      Head Circumference --      Peak Flow --      Pain Score 08/27/21 1124 0     Pain Loc --      Pain Edu? --      Excl. in [place]? --    No data found.  Updated Vital Signs BP 135/69   Pulse 81   Temp 98 F (36.7 C)   Resp 18   SpO2 96%   Visual Acuity Right Eye Distance:   Left Eye Distance:   Bilateral Distance:    Right Eye Near:   Left Eye Near:    Bilateral Near:     Physical Exam Vitals and nursing note reviewed.  Constitutional:      General: He is not in acute distress.    Appearance: Normal appearance. He is not ill-appearing or toxic-appearing.  HENT:     Head: Normocephalic and atraumatic.     Right Ear: Tympanic membrane, ear canal and external ear normal.     Left Ear: Tympanic membrane, ear canal and external ear normal.     Nose: Rhinorrhea present. No congestion.     Right Sinus: Maxillary sinus tenderness present. No frontal sinus tenderness.     Left Sinus: Maxillary sinus tenderness present. No frontal sinus tenderness.     Mouth/Throat:     Mouth: Mucous membranes are moist.     Pharynx: Oropharynx is clear. Posterior oropharyngeal erythema present. No oropharyngeal exudate.  Eyes:     General: No scleral  icterus.    Extraocular Movements: Extraocular movements intact.  Cardiovascular:     Rate and Rhythm: Normal rate and regular rhythm.  Pulmonary:     Effort: Pulmonary effort is normal. No respiratory distress.     Breath sounds: Normal breath sounds. No wheezing, rhonchi or rales.  Musculoskeletal:     Cervical back: Normal range of motion and neck supple.     Right lower leg: No edema.     Left lower leg: No edema.  Lymphadenopathy:     Cervical: No cervical adenopathy.  Skin:    General: Skin is warm and dry.     Capillary Refill: Capillary refill takes less than 2 seconds.     Coloration: Skin is not jaundiced or pale.     Findings: No erythema or rash.  Neurological:     Mental Status: He is alert and oriented to person, place, and time.  Psychiatric:        Behavior: Behavior is cooperative.     UC Treatments / Results  Labs (all labs ordered are listed, but only abnormal results are displayed) Labs Reviewed - No data to display  EKG   Radiology DG Chest 2 View  Result Date: 08/27/2021 CLINICAL DATA:  Cough for 3 weeks. EXAM: CHEST - 2 VIEW COMPARISON:  Chest radiograph dated March 15, 2021 FINDINGS: The heart size and mediastinal contours are within normal limits. No focal consolidation or pleural effusion. Prominence of the bronchial markings, which may be seen in viral bronchitis. The visualized skeletal structures are unremarkable. IMPRESSION: 1.  No focal consolidation or pleural effusion. 2. Prominent bilateral bronchial markings, which may represent reactive airway disease  or viral bronchitis. Electronically Signed   By: Keane Police D.O.   On: 08/27/2021 11:41    Procedures Procedures (including critical care time)  Medications Ordered in UC Medications - No data to display  Initial Impression / Assessment and Plan / UC Course  I have reviewed the triage vital signs and the nursing notes.  Pertinent labs & imaging results that were available during my  care of the patient were reviewed by me and considered in my medical decision making (see chart for details).    Chest x-ray today does not show acute pneumonia.  There does appear to be some bronchial thickening on x-ray, however there is no wheezing on examination today.  Given maxillary tenderness, we will treat for sinus infection with Augmentin twice daily for 7 days.  We will hold off on prednisone given no wheezing and history of CLL.  Encouraged use of albuterol inhaler when feeling short of breath and if symptoms persist or worsen despite treatment, seek care.  Start nasal saline rinses to help with sinus drainage.  With sudden onset of chest pain, worsening shortness of breath, wheezing, go to emergency room. Final Clinical Impressions(s) / UC Diagnoses   Final diagnoses:  Acute non-recurrent maxillary sinusitis     Discharge Instructions      - Chest x-ray today does not show pneumonia - Please start the Augmentin twice daily for 7 days for a sinus infection - Continue Mucinex, push hydration, and start nasal rinses with saline to help with drainage - If your symptoms persist or worsen despite treatment, please seek care    ED Prescriptions     Medication Sig Dispense Auth. Provider   amoxicillin-clavulanate (AUGMENTIN) 875-125 MG tablet Take 1 tablet by mouth 2 (two) times daily for 7 days. 14 tablet Eulogio Bear, NP      PDMP not reviewed this encounter.   Eulogio Bear, NP 08/27/21 1234

## 2021-08-27 NOTE — Discharge Instructions (Addendum)
-   Chest x-ray today does not show pneumonia - Please start the Augmentin twice daily for 7 days for a sinus infection - Continue Mucinex, push hydration, and start nasal rinses with saline to help with drainage - If your symptoms persist or worsen despite treatment, please seek care

## 2021-08-29 ENCOUNTER — Encounter: Payer: Self-pay | Admitting: *Deleted

## 2021-09-06 ENCOUNTER — Other Ambulatory Visit (HOSPITAL_COMMUNITY): Payer: Self-pay | Admitting: Internal Medicine

## 2021-09-06 ENCOUNTER — Ambulatory Visit (HOSPITAL_COMMUNITY)
Admission: RE | Admit: 2021-09-06 | Discharge: 2021-09-06 | Disposition: A | Discharge status: Home / Self Care | Payer: Medicare Other | Source: Ambulatory Visit | Attending: Internal Medicine | Admitting: Internal Medicine

## 2021-09-06 DIAGNOSIS — I700 Atherosclerosis of aorta: Secondary | ICD-10-CM | POA: Diagnosis not present

## 2021-09-06 DIAGNOSIS — Z6823 Body mass index (BMI) 23.0-23.9, adult: Secondary | ICD-10-CM | POA: Diagnosis not present

## 2021-09-06 DIAGNOSIS — I10 Essential (primary) hypertension: Secondary | ICD-10-CM | POA: Diagnosis not present

## 2021-09-06 DIAGNOSIS — J449 Chronic obstructive pulmonary disease, unspecified: Secondary | ICD-10-CM | POA: Diagnosis not present

## 2021-09-06 DIAGNOSIS — C9110 Chronic lymphocytic leukemia of B-cell type not having achieved remission: Secondary | ICD-10-CM | POA: Diagnosis not present

## 2021-09-06 DIAGNOSIS — R059 Cough, unspecified: Secondary | ICD-10-CM

## 2021-09-06 DIAGNOSIS — R079 Chest pain, unspecified: Secondary | ICD-10-CM | POA: Insufficient documentation

## 2021-09-06 DIAGNOSIS — R042 Hemoptysis: Secondary | ICD-10-CM | POA: Diagnosis not present

## 2021-09-11 DIAGNOSIS — J449 Chronic obstructive pulmonary disease, unspecified: Secondary | ICD-10-CM | POA: Diagnosis not present

## 2021-09-11 DIAGNOSIS — Z6822 Body mass index (BMI) 22.0-22.9, adult: Secondary | ICD-10-CM | POA: Diagnosis not present

## 2021-09-11 DIAGNOSIS — I700 Atherosclerosis of aorta: Secondary | ICD-10-CM | POA: Diagnosis not present

## 2021-09-11 DIAGNOSIS — G894 Chronic pain syndrome: Secondary | ICD-10-CM | POA: Diagnosis not present

## 2021-09-11 DIAGNOSIS — I10 Essential (primary) hypertension: Secondary | ICD-10-CM | POA: Diagnosis not present

## 2021-09-11 DIAGNOSIS — C9110 Chronic lymphocytic leukemia of B-cell type not having achieved remission: Secondary | ICD-10-CM | POA: Diagnosis not present

## 2021-09-11 DIAGNOSIS — D171 Benign lipomatous neoplasm of skin and subcutaneous tissue of trunk: Secondary | ICD-10-CM | POA: Diagnosis not present

## 2021-09-19 ENCOUNTER — Other Ambulatory Visit: Payer: Self-pay | Admitting: *Deleted

## 2021-09-19 DIAGNOSIS — M4310 Spondylolisthesis, site unspecified: Secondary | ICD-10-CM | POA: Diagnosis not present

## 2021-09-19 DIAGNOSIS — R222 Localized swelling, mass and lump, trunk: Secondary | ICD-10-CM

## 2021-09-19 DIAGNOSIS — M5416 Radiculopathy, lumbar region: Secondary | ICD-10-CM | POA: Diagnosis not present

## 2021-09-20 ENCOUNTER — Encounter: Payer: Self-pay | Admitting: General Surgery

## 2021-09-20 ENCOUNTER — Ambulatory Visit (INDEPENDENT_AMBULATORY_CARE_PROVIDER_SITE_OTHER): Payer: Medicare Other | Admitting: General Surgery

## 2021-09-20 VITALS — BP 115/71 | HR 69 | Temp 98.1°F | Resp 12 | Ht 73.0 in | Wt 172.0 lb

## 2021-09-20 DIAGNOSIS — D171 Benign lipomatous neoplasm of skin and subcutaneous tissue of trunk: Secondary | ICD-10-CM

## 2021-09-20 NOTE — Patient Instructions (Signed)
Take tylenol 1000 mg 30 minutes before the procedure.   Lipoma Removal  Lipoma removal is a surgical procedure to remove a lipoma, which is a noncancerous (benign) tumor that is made up of fat cells. Most lipomas are small and painless and do not require treatment. They can form in many areas of the body but are most common under the skin of the back, arms, shoulders, buttocks, and thighs. You may need lipoma removal if you have a lipoma that is large, growing, or causing discomfort. Lipoma removal may also be done for cosmetic reasons. Tell a health care provider about: Any allergies you have. All medicines you are taking, including vitamins, herbs, eye drops, creams, and over-the-counter medicines. Any problems you or family members have had with anesthetic medicines. Any bleeding problems you have. Any surgeries you have had. Any medical conditions you have. Whether you are pregnant or may be pregnant. What are the risks? Generally, this is a safe procedure. However, problems may occur, including: Infection. Bleeding. Scarring. Allergic reactions to medicines. Damage to nearby structures or organs, such as damage to nerves or blood vessels near the lipoma. What happens before the procedure? Medicines Ask your health care provider about: Changing or stopping your regular medicines. This is especially important if you are taking diabetes medicines or blood thinners. Taking medicines such as aspirin and ibuprofen. These medicines can thin your blood. Do not take these medicines unless your health care provider tells you to take them. Taking over-the-counter medicines, vitamins, herbs, and supplements. General instructions You will have a physical exam. Your health care provider will check the size of the lipoma and whether it can be removed easily. You may have a biopsy and imaging tests, such as X-rays, a CT scan, and an MRI. Do not use any products that contain nicotine or tobacco  for at least 4 weeks before the procedure. These products include cigarettes, chewing tobacco, and vaping devices, such as e-cigarettes. If you need help quitting, ask your health care provider. Ask your health care provider: How your surgery site will be marked. What steps will be taken to help prevent infection. These may include: Washing skin with a germ-killing soap. Taking antibiotic medicine. If you will be going home right after the procedure, plan to have a responsible adult: Take you home from the hospital or clinic. You will not be allowed to drive. Care for you for the time you are told. What happens during the procedure?  An IV will be inserted into one of your veins. You will be given one or more of the following: A medicine to help you relax (sedative). A medicine to numb the area (local anesthetic). A medicine to make you fall asleep (general anesthetic). A medicine that is injected into an area of your body to numb everything below the injection site (regional anesthetic). An incision will be made into the skin over the lipoma or very near the lipoma. The incision may be made in a natural skin line or crease. Tissues, nerves, and blood vessels near the lipoma will be moved out of the way. The lipoma and the capsule that surrounds it will be separated from the surrounding tissues. The lipoma will be removed. The incision may be closed with stitches (sutures). A bandage (dressing) will be placed over the incision. The procedure may vary among health care providers and hospitals. What happens after the procedure? Your blood pressure, heart rate, breathing rate, and blood oxygen level will be monitored until you leave  the hospital or clinic. If you were prescribed an antibiotic medicine, use it as told by your health care provider. Do not stop using the antibiotic even if you start to feel better. If you were given a sedative during the procedure, it can affect you for several  hours. Do not drive or operate machinery until your health care provider says that it is safe. Where to find more information OrthoInfo: orthoinfo.aaos.org Summary Before the procedure, follow instructions from your health care provider about eating and drinking, and changing or stopping your regular medicines. This is especially important if you are taking diabetes medicines or blood thinners. After the lipoma is removed, the incision may be closed with stitches (sutures) and covered with a bandage (dressing). If you were given a sedative during the procedure, it can affect you for several hours. Do not drive or operate machinery until your health care provider says that it is safe. This information is not intended to replace advice given to you by your health care provider. Make sure you discuss any questions you have with your health care provider. Document Revised: 04/06/2021 Document Reviewed: 04/06/2021 Elsevier Patient Education  [place].

## 2021-09-20 NOTE — Progress Notes (Signed)
[facility] History and Physical  Reason for Referral: Left chest lipoma   Referring Physician: Dr. Margo Aye   Chief Complaint   New Patient (Initial Visit)     Alfred Brown is a 71 y.o. male.  HPI: Alfred Brown is a very sweet 71 yo who comes in from Dr. Scharlene Gloss office. He had a lipoma on his left chest area for years and says that it has not grown or caused him any problems. He was seen by Dr. Scharlene Gloss office and had some Basal cell cancers and other lesions removed, and they noted this lipoma.  The patient also reports a history of CLL and not on any treatment of this time. His immunoglobulin levels are lower but is is otherwise stable and doing well.   He say Dr. Ellin Saba in March 2023. He is interested in getting the lipoma excised.    Past Medical History:  Diagnosis Date   Aortic valve disorder    Mild insufficiency   Chronic lymphocytic leukemia (HCC)    01/2012: WBC-90.2, H&H-15.1/45.3, platelets-185 03/31/12: Verified with Dr. Mariel Sleet that no precautions or modification of medical regime are required prior to orthopaedic surgery.    CLL (chronic lymphocytic leukemia) (HCC)    CMV (cytomegalovirus) (HCC)    COPD (chronic obstructive pulmonary disease) (HCC)    Depression with anxiety    DJD (degenerative joint disease)    GERD (gastroesophageal reflux disease)    Hyperlipidemia    Hypogammaglobulinemia (HCC) 09/23/2019   Lipoma    left chest wall   Palpitations 2004   PVCs; borderline stress nuclear in 2004, negative in 2007; Echo 2007; AV-sclerotic, very mild AI   Pneumonia    Right bundle branch block    + left posterior fascicular block   Tobacco abuse    80 pack years    Past Surgical History:  Procedure Laterality Date   COLONOSCOPY  01/2012   Negative screening study   GANGLION CYST EXCISION  1997   left wrist   ROTATOR CUFF REPAIR  1997   right   SHOULDER ARTHROSCOPY WITH SUBACROMIAL DECOMPRESSION  04/09/2012   Procedure: SHOULDER  ARTHROSCOPY WITH SUBACROMIAL DECOMPRESSION;  Surgeon: Loreta Ave, MD;  Location: [facility];  Service: Orthopedics;  Laterality: Left;  LEFT SHOULDER ARTHROSCOPY, SUBACROMIAL DECOMPRESSION, PARTIAL ACROMIOPLASTY WITH CORACOACROMIAL RELEASE, DISTAL CLAVICULECTOMY WITH ROTATOR CUFF REPAIR, DEBRIDEMENT OF LABRUM    Family History  Problem Relation Age of Onset   Stroke Mother    Heart attack Father    Cancer Sister        colon   Colon cancer Sister    Cancer Brother        2 brothers died with lung cancer    Social History   Tobacco Use   Smoking status: Every Day    Packs/day: 1.00    Years: 45.00    Total pack years: 45.00    Types: Cigarettes    Start date: 12/03/1963   Smokeless tobacco: Never  Substance Use Topics   Alcohol use: No    Alcohol/week: 0.0 standard drinks of alcohol   Drug use: No    Medications: I have reviewed the patient's current medications. Allergies as of 09/20/2021   No Known Allergies      Medication List        Accurate as of September 20, 2021  1:18 PM. If you have any questions, ask your nurse or doctor.          albuterol  108 (90 Base) MCG/ACT inhaler Commonly known as: VENTOLIN HFA Inhale 2 puffs into the lungs every 6 (six) hours as needed for wheezing or shortness of breath.   ALPRAZolam 1 MG tablet Commonly known as: XANAX Take 1 mg by mouth 3 (three) times daily as needed for anxiety.   aspirin 325 MG tablet Take 325 mg by mouth daily.   buPROPion 150 MG 12 hr tablet Commonly known as: WELLBUTRIN SR Take 150 mg by mouth daily.   calcium carbonate 600 MG Tabs tablet Commonly known as: OS-CAL Take 600 mg by mouth daily.   celecoxib 100 MG capsule Commonly known as: CELEBREX Take 100 mg by mouth 2 (two) times daily.   clotrimazole-betamethasone cream Commonly known as: LOTRISONE Apply 1 application topically daily as needed. Affected area   diltiazem 30 MG tablet Commonly known as: CARDIZEM Take 30  mg by mouth as needed.   fish oil-omega-3 fatty acids 1000 MG capsule Take 1 g by mouth daily.   fluticasone 50 MCG/ACT nasal spray Commonly known as: FLONASE Place 2 sprays into the nose daily.   HYDROcodone-acetaminophen 5-325 MG tablet Commonly known as: NORCO/VICODIN Take 1 tablet by mouth every 6 (six) hours as needed for moderate pain.   Loratadine 10 MG Caps Take 10 mg by mouth daily.   lovastatin 20 MG tablet Commonly known as: MEVACOR Take 20 mg by mouth daily.   metoprolol tartrate 25 MG tablet Commonly known as: LOPRESSOR Take 1 tablet (25 mg total) by mouth 2 (two) times daily.   multivitamin tablet Take 1 tablet by mouth daily.   niacin 500 MG CR tablet Commonly known as: NIASPAN Take 500 mg by mouth daily.   valACYclovir 500 MG tablet Commonly known as: VALTREX Take 500 mg by mouth daily.   valACYclovir 1000 MG tablet Commonly known as: VALTREX Take 4,000 mg by mouth as needed.         ROS:  A comprehensive review of systems was negative except for: Ears, nose, mouth, throat, and face: positive for sinus issues Hematologic/lymphatic: positive for CLL Musculoskeletal: positive for back pain and neck pain  Blood pressure 115/71, pulse 69, temperature 98.1 F (36.7 C), temperature source Oral, resp. rate 12, height 6\' 1"  (1.854 m), weight 172 lb (78 kg), SpO2 95 %. Physical Exam Vitals reviewed.  Constitutional:      Appearance: Normal appearance.  HENT:     Head: Normocephalic.     Nose: Nose normal.     Mouth/Throat:     Mouth: Mucous membranes are moist.  Eyes:     Extraocular Movements: Extraocular movements intact.  Cardiovascular:     Rate and Rhythm: Normal rate and regular rhythm.  Pulmonary:     Effort: Pulmonary effort is normal.     Breath sounds: Normal breath sounds.  Chest:     Comments: Minor telangiectasias on his chest area, left upper chest with 3cm, superficial mobile spongy mass  Abdominal:     General: There is no  distension.     Palpations: Abdomen is soft.     Tenderness: There is no abdominal tenderness.  Musculoskeletal:        General: Normal range of motion.     Cervical back: Normal range of motion.  Skin:    General: Skin is warm.  Neurological:     General: No focal deficit present.     Mental Status: He is alert.  Psychiatric:        Mood and Affect: Mood normal.  Thought Content: Thought content normal.     Results: None    Assessment & Plan:  Alfred Brown is a 71 y.o. male with a lipoma on the left upper chest that has been there for years but he wants to go ahead and get removed. This is amenable to removal in the clinic with local. Discussed risk of bleeding, infection, healing issues, but given this and its superficial nature I do not think he will have issues.   I will send Dr. Ellin Saba message to ensure he does not think he needs any prophylaxis or anything prior to this excision.   Future Appointments  Date Time Provider Department Center  10/11/2021 12:00 PM Lucretia Roers, MD [facility] None  10/23/2021  2:00 PM Antoine Poche, MD [facility]  01/07/2022 12:50 PM [facility] None  01/14/2022  3:00 PM Doreatha Massed, MD [facility] None    All questions were answered to the satisfaction of the patient and family.   Lucretia Roers 09/20/2021, 1:18 PM

## 2021-10-03 ENCOUNTER — Telehealth: Payer: Self-pay | Admitting: *Deleted

## 2021-10-03 NOTE — Telephone Encounter (Signed)
Received call from patient spouse, [name] [phone]~ telephone.   Reports that patient is scheduled for in office procedure for lipoma removal of anterior chest wall on 10/11/2021. Reports that patient has echocardiogram scheduled on 10/23/2021.   Spouse inquired if that would be sufficient time for healing, especially since patient has delayed wound healing d/t leukemia.   Please advise.

## 2021-10-05 NOTE — Telephone Encounter (Signed)
Discussed with Dr. Constance Haw.   Reports that excision site should not be in conflict with ECHO testing. Also reports that patient should have adequate time between procedures for healing.   Advised that excision can be re-scheduled per patient preference.   Call placed to patient. [place].

## 2021-10-08 DIAGNOSIS — M5416 Radiculopathy, lumbar region: Secondary | ICD-10-CM | POA: Diagnosis not present

## 2021-10-08 NOTE — Telephone Encounter (Signed)
Call placed to patient and patient wife Alfred Brown made aware.

## 2021-10-11 ENCOUNTER — Encounter: Payer: Self-pay | Admitting: General Surgery

## 2021-10-11 ENCOUNTER — Ambulatory Visit (INDEPENDENT_AMBULATORY_CARE_PROVIDER_SITE_OTHER): Payer: Medicare Other | Admitting: General Surgery

## 2021-10-11 VITALS — BP 144/72 | HR 68 | Temp 97.8°F | Resp 14 | Ht 73.0 in | Wt 173.0 lb

## 2021-10-11 DIAGNOSIS — D171 Benign lipomatous neoplasm of skin and subcutaneous tissue of trunk: Secondary | ICD-10-CM

## 2021-10-11 DIAGNOSIS — D179 Benign lipomatous neoplasm, unspecified: Secondary | ICD-10-CM | POA: Diagnosis not present

## 2021-10-11 HISTORY — PX: LIPOMA EXCISION: SHX5283

## 2021-10-11 MED ORDER — OXYCODONE HCL 5 MG PO TABS: 5.0000 mg | ORAL_TABLET | ORAL | 0 refills | Status: DC | PRN

## 2021-10-11 NOTE — Patient Instructions (Signed)
Lipoma Removal, Care After The following information offers guidance on how to care for yourself after your procedure. Your health care provider may also give you more specific instructions. If you have problems or questions, contact your health care provider. What can I expect after the procedure? After the procedure, it is common to have: Mild pain. Swelling. Bruising. Follow these instructions at home: Bathing  Do not take baths, swim, or use a hot tub until your health care provider approves. Ask your health care provider if you may take showers. You may only be allowed to take sponge baths. Keep your bandage (dressing) clean and dry until your health care provider says it can be removed. Incision care  Follow instructions from your health care provider about how to take care of your incision. Make sure you: Wash your hands with soap and water for at least 20 seconds before and after you change your dressing. If soap and water are not available, use hand sanitizer. Change your dressing as told by your health care provider. Leave stitches (sutures), skin glue, or adhesive strips in place. These skin closures may need to stay in place for 2 weeks or longer. If adhesive strip edges start to loosen and curl up, you may trim the loose edges. Do not remove adhesive strips completely unless your health care provider tells you to do that. Check your incision area every day for signs of infection. Check for: More redness, swelling, or pain. Fluid or blood. Warmth. Pus or a bad smell. Medicines Take over-the-counter and prescription medicines only as told by your health care provider. If you were prescribed an antibiotic medicine, use it as told by your health care provider. Do not stop using the antibiotic even if you start to feel better. General instructions  If you were given a sedative during the procedure, it can affect you for several hours. Do not drive or operate machinery until your  health care provider says that it is safe. Do not use any products that contain nicotine or tobacco before the procedure. These products include cigarettes, chewing tobacco, and vaping devices, such as e-cigarettes. These can delay healing after surgery. If you need help quitting, ask your health care provider. Return to your normal activities as told by your health care provider. Ask your health care provider what activities are safe for you. Keep all follow-up visits. This is important. Contact a health care provider if: You have more redness, swelling, or pain around your incision. You have fluid or blood coming from your incision. Your incision feels warm to the touch. You have pus or a bad smell coming from your incision. You have pain that does not get better with medicine. Get help right away if: You have chills or a fever. You have severe pain. Summary After the procedure, it is common to have mild pain, swelling, and bruising. Follow instructions from your health care provider about how to take care of your incision. Contact a health care provider if you have signs of infection such as more redness, swelling, or pain. This information is not intended to replace advice given to you by your health care provider. Make sure you discuss any questions you have with your health care provider. Document Revised: 04/06/2021 Document Reviewed: 04/06/2021 Elsevier Patient Education  [place].

## 2021-10-11 NOTE — Progress Notes (Signed)
[facility] Procedure Note  10/11/21  Pre-procedure Diagnosis: Lipoma left chest    Post-procedure Diagnosis: Same   Procedure(s) Performed: Excision lipoma left chest, 4 cm    Surgeon: Lanell Matar. Constance Haw, MD   Assistants: No qualified resident was available    Anesthesia: Lidocaine 1%    Specimens: Lipoma    Estimated Blood Loss: Minimal  Wound Class: Clean    Procedure Indications: Mr. Boomhower is a 71 yo with a history of a lipoma on the chest area for several years. Had recently had a skin cancer removed in the same area by Dr. Juel Burrow office and they had recommendations that he get the lipoma removed. He says it has been there for years and that it does not bother him but he does notice it. He says that he would like to get it removed so that it is no longer an issue.   Findings: Lipoma    Procedure: The patient was taken to the procedure room and placed semi upright. The left chest was prepared and draped in the usual sterile fashion. Lidocaine 1% was injected. A horizontal incision was made over the lesion which was 6 inches above the nipple. This was carried down to the subcutaneous tissue and with sharp and blunt dissection with scissors a 4cm lipoma with finger projections was removed.    The areas was made hemostatic. 3-0 Vicryl was used to close the  deep space. The skin was closed with subcuticular 4-0 Monocryl and dermabond.   Final inspection revealed acceptable hemostasis. The patient tolerated the procedure well.   Roxicodone 5 mg q 4 PRN # 8 was given as needed for pain.   Curlene Labrum, MD [facility] [address] [place], [place] [address] [phone] (office)

## 2021-10-12 DIAGNOSIS — D171 Benign lipomatous neoplasm of skin and subcutaneous tissue of trunk: Secondary | ICD-10-CM | POA: Diagnosis not present

## 2021-10-12 DIAGNOSIS — J329 Chronic sinusitis, unspecified: Secondary | ICD-10-CM | POA: Diagnosis not present

## 2021-10-12 DIAGNOSIS — R059 Cough, unspecified: Secondary | ICD-10-CM | POA: Diagnosis not present

## 2021-10-12 DIAGNOSIS — G894 Chronic pain syndrome: Secondary | ICD-10-CM | POA: Diagnosis not present

## 2021-10-12 DIAGNOSIS — C9110 Chronic lymphocytic leukemia of B-cell type not having achieved remission: Secondary | ICD-10-CM | POA: Diagnosis not present

## 2021-10-12 DIAGNOSIS — Z6822 Body mass index (BMI) 22.0-22.9, adult: Secondary | ICD-10-CM | POA: Diagnosis not present

## 2021-10-12 DIAGNOSIS — I10 Essential (primary) hypertension: Secondary | ICD-10-CM | POA: Diagnosis not present

## 2021-10-15 ENCOUNTER — Other Ambulatory Visit: Payer: Self-pay | Admitting: Internal Medicine

## 2021-10-15 ENCOUNTER — Other Ambulatory Visit (HOSPITAL_COMMUNITY): Payer: Self-pay | Admitting: Internal Medicine

## 2021-10-15 DIAGNOSIS — R079 Chest pain, unspecified: Secondary | ICD-10-CM

## 2021-10-15 DIAGNOSIS — J329 Chronic sinusitis, unspecified: Secondary | ICD-10-CM

## 2021-10-15 DIAGNOSIS — R059 Cough, unspecified: Secondary | ICD-10-CM

## 2021-10-15 LAB — TISSUE SPECIMEN

## 2021-10-15 LAB — PATHOLOGY REPORT

## 2021-10-17 DIAGNOSIS — M4310 Spondylolisthesis, site unspecified: Secondary | ICD-10-CM | POA: Diagnosis not present

## 2021-10-22 ENCOUNTER — Other Ambulatory Visit (HOSPITAL_COMMUNITY): Payer: Self-pay | Admitting: Internal Medicine

## 2021-10-22 ENCOUNTER — Ambulatory Visit (HOSPITAL_COMMUNITY)
Admission: RE | Admit: 2021-10-22 | Discharge: 2021-10-22 | Disposition: A | Discharge status: Home / Self Care | Payer: Medicare Other | Source: Ambulatory Visit | Attending: Internal Medicine | Admitting: Internal Medicine

## 2021-10-22 DIAGNOSIS — I700 Atherosclerosis of aorta: Secondary | ICD-10-CM | POA: Diagnosis not present

## 2021-10-22 DIAGNOSIS — J449 Chronic obstructive pulmonary disease, unspecified: Secondary | ICD-10-CM | POA: Diagnosis not present

## 2021-10-22 DIAGNOSIS — Z6822 Body mass index (BMI) 22.0-22.9, adult: Secondary | ICD-10-CM | POA: Diagnosis not present

## 2021-10-22 DIAGNOSIS — J329 Chronic sinusitis, unspecified: Secondary | ICD-10-CM | POA: Diagnosis not present

## 2021-10-22 DIAGNOSIS — R0781 Pleurodynia: Secondary | ICD-10-CM | POA: Diagnosis not present

## 2021-10-22 DIAGNOSIS — C9110 Chronic lymphocytic leukemia of B-cell type not having achieved remission: Secondary | ICD-10-CM | POA: Diagnosis not present

## 2021-10-22 DIAGNOSIS — I10 Essential (primary) hypertension: Secondary | ICD-10-CM | POA: Diagnosis not present

## 2021-10-23 ENCOUNTER — Ambulatory Visit (INDEPENDENT_AMBULATORY_CARE_PROVIDER_SITE_OTHER): Payer: Medicare Other | Admitting: Cardiology

## 2021-10-23 ENCOUNTER — Encounter: Payer: Self-pay | Admitting: Cardiology

## 2021-10-23 ENCOUNTER — Encounter: Payer: Self-pay | Admitting: *Deleted

## 2021-10-23 VITALS — BP 126/60 | HR 72 | Ht 73.0 in | Wt 174.2 lb

## 2021-10-23 DIAGNOSIS — I6529 Occlusion and stenosis of unspecified carotid artery: Secondary | ICD-10-CM | POA: Diagnosis not present

## 2021-10-23 DIAGNOSIS — I350 Nonrheumatic aortic (valve) stenosis: Secondary | ICD-10-CM | POA: Diagnosis not present

## 2021-10-23 DIAGNOSIS — R002 Palpitations: Secondary | ICD-10-CM

## 2021-10-23 DIAGNOSIS — I471 Supraventricular tachycardia: Secondary | ICD-10-CM | POA: Diagnosis not present

## 2021-10-23 MED ORDER — METOPROLOL TARTRATE 37.5 MG PO TABS: 37.5000 mg | ORAL_TABLET | Freq: Two times a day (BID) | ORAL | 6 refills | Status: DC

## 2021-10-23 NOTE — Patient Instructions (Signed)
Medication Instructions:  Increase Lopressor to 37.5mg  twice a day   Continue all other medications.     Labwork: Your physician has requested that you have an echocardiogram. Echocardiography is a painless test that uses sound waves to create images of your heart. It provides your doctor with information about the size and shape of your heart and how well your heart's chambers and valves are working. This procedure takes approximately one hour. There are no restrictions for this procedure. Your physician has requested that you have a carotid duplex. This test is an ultrasound of the carotid arteries in your neck. It looks at blood flow through these arteries that supply the brain with blood. Allow one hour for this exam. There are no restrictions or special instructions.  Office will contact with results via phone, letter or mychart.     Testing/Procedures: none  Follow-Up: 6 months   Any Other Special Instructions Will Be Listed Below (If Applicable).   If you need a refill on your cardiac medications before your next appointment, please call your pharmacy.

## 2021-10-23 NOTE — Progress Notes (Signed)
Clinical Summary [name] is a 71 y.o.male seen today for follow up of the following medical problems.      1. Palpitations - long history of symtpoms.  -2017 monitor with just PACs   - noted from tele lead during 07/2019 echo, rates 130s narrow complex regular - 07/2019 7 day event monitor without signficant arrhythmias - 09/14/2019 EKG shows SVT       - reports recent palpitations, can last hours at a time.      2.Possible Bicuspid aortic valve/ Aortic stenosis - somewhat questionable on prior imaging - CTA chest 12/2004 without significant aortopathy.  - 2019 echo LVEF 60-65%, mild to mod AS (mean grad 17, AVA VTI 1.4)     07/2019 echo: LVEF 55-60%, mild to mod AS - no recent SOB/DOE     3. CLL - followed at cancer center       4. COPD - admit 05/2019 with COPD exacerbation, pneumonia     5. AAA screen - had prior CT 12/2015 A/P for flank pain, no aneurysm noted   Past Medical History:  Diagnosis Date   Aortic valve disorder    Mild insufficiency   Chronic lymphocytic leukemia ([facility])    01/2012: WBC-90.2, H&H-15.1/45.3, platelets-185 03/31/12: Verified with Dr. Tressie Stalker that no precautions or modification of medical regime are required prior to orthopaedic surgery.    CLL (chronic lymphocytic leukemia) (H[facility])    CMV (cytomegalovirus) (HCC)    COPD (chronic obstructive pulmonary disease) (HCC)    Depression with anxiety    DJD (degenerative joint disease)    GERD (gastroesophageal reflux disease)    Hyperlipidemia    Hypogammaglobulinemia ([facility]) 09/23/2019   Lipoma    left chest wall   Palpitations 2004   PVCs; borderline stress nuclear in 2004, negative in 2007; Echo 2007; AV-sclerotic, very mild AI   Pneumonia    Right bundle Shaquila Sigman block    + left posterior fascicular block   Tobacco abuse    80 pack years     No Known Allergies   Current Outpatient Medications  Medication Sig Dispense Refill   albuterol (PROVENTIL HFA;VENTOLIN HFA) 108  (90 BASE) MCG/ACT inhaler Inhale 2 puffs into the lungs every 6 (six) hours as needed for wheezing or shortness of breath.      ALPRAZolam (XANAX) 1 MG tablet Take 1 mg by mouth 3 (three) times daily as needed for anxiety.      aspirin 325 MG tablet Take 325 mg by mouth daily.     buPROPion (WELLBUTRIN SR) 150 MG 12 hr tablet Take 150 mg by mouth daily.     calcium carbonate (OS-CAL) 600 MG TABS Take 600 mg by mouth daily.     celecoxib (CELEBREX) 100 MG capsule Take 100 mg by mouth 2 (two) times daily.     clotrimazole-betamethasone (LOTRISONE) cream Apply 1 application topically daily as needed. Affected area     diltiazem (CARDIZEM) 30 MG tablet Take 30 mg by mouth as needed.     fish oil-omega-3 fatty acids 1000 MG capsule Take 1 g by mouth daily.     fluticasone (FLONASE) 50 MCG/ACT nasal spray Place 2 sprays into the nose daily.     HYDROcodone-acetaminophen (NORCO/VICODIN) 5-325 MG per tablet Take 1 tablet by mouth every 6 (six) hours as needed for moderate pain.      Loratadine 10 MG CAPS Take 10 mg by mouth daily.     lovastatin (MEVACOR) 20 MG tablet Take 20 mg  by mouth daily.     metoprolol tartrate (LOPRESSOR) 25 MG tablet Take 1 tablet (25 mg total) by mouth 2 (two) times daily. 180 tablet 3   Multiple Vitamin (MULTIVITAMIN) tablet Take 1 tablet by mouth daily.     niacin (NIASPAN) 500 MG CR tablet Take 500 mg by mouth daily.     oxyCODONE (ROXICODONE) 5 MG immediate release tablet Take 1 tablet (5 mg total) by mouth every 4 (four) hours as needed for severe pain or breakthrough pain. 8 tablet 0   valACYclovir (VALTREX) 1000 MG tablet Take 4,000 mg by mouth as needed.     valACYclovir (VALTREX) 500 MG tablet Take 500 mg by mouth daily.     No current facility-administered medications for this visit.     Past Surgical History:  Procedure Laterality Date   COLONOSCOPY  01/2012   Negative screening study   GANGLION CYST EXCISION  1997   left wrist   ROTATOR CUFF REPAIR  1997    right   SHOULDER ARTHROSCOPY WITH SUBACROMIAL DECOMPRESSION  04/09/2012   Procedure: SHOULDER ARTHROSCOPY WITH SUBACROMIAL DECOMPRESSION;  Surgeon: Ninetta Lights, MD;  Location: [facility];  Service: Orthopedics;  Laterality: Left;  LEFT SHOULDER ARTHROSCOPY, SUBACROMIAL DECOMPRESSION, PARTIAL ACROMIOPLASTY WITH CORACOACROMIAL RELEASE, DISTAL CLAVICULECTOMY WITH ROTATOR CUFF REPAIR, DEBRIDEMENT OF LABRUM     No Known Allergies    Family History  Problem Relation Age of Onset   Stroke Mother    Heart attack Father    Cancer Sister        colon   Colon cancer Sister    Cancer Brother        2 brothers died with lung cancer     Social History [name] reports that he has been smoking cigarettes. He started smoking about 57 years ago. He has a 45.00 pack-year smoking history. He has never used smokeless tobacco. [name] reports no history of alcohol use.   Review of Systems CONSTITUTIONAL: No weight loss, fever, chills, weakness or fatigue.  HEENT: Eyes: No visual loss, blurred vision, double vision or yellow sclerae.No hearing loss, sneezing, congestion, runny nose or sore throat.  SKIN: No rash or itching.  CARDIOVASCULAR: per hpi RESPIRATORY: No shortness of breath, cough or sputum.  GASTROINTESTINAL: No anorexia, nausea, vomiting or diarrhea. No abdominal pain or blood.  GENITOURINARY: No burning on urination, no polyuria NEUROLOGICAL: No headache, dizziness, syncope, paralysis, ataxia, numbness or tingling in the extremities. No change in bowel or bladder control.  MUSCULOSKELETAL: No muscle, back pain, joint pain or stiffness.  LYMPHATICS: No enlarged nodes. No history of splenectomy.  PSYCHIATRIC: No history of depression or anxiety.  ENDOCRINOLOGIC: No reports of sweating, cold or heat intolerance. No polyuria or polydipsia.  [name]   Physical Examination Today's Vitals   10/23/21 1343  BP: 126/60  Pulse: 72  SpO2: 98%  Weight: 174 lb 3.2 oz (79  kg)  Height: 6\' 1"  (1.854 m)   Body mass index is 22.98 kg/m.  Gen: resting comfortably, no acute distress HEENT: no scleral icterus, pupils equal round and reactive, no palptable cervical adenopathy,  CV: RRR, no m/r/g no jvd. 3/6 systolic murmur rusb, +bilateral carotid bruits Resp: Clear to auscultation bilaterally GI: abdomen is soft, non-tender, non-distended, normal bowel sounds, no hepatosplenomegaly MSK: extremities are warm, no edema.  Skin: warm, no rash Neuro:  no focal deficits Psych: appropriate affect   Diagnostic Studies Jan 2021 carotid US IMPRESSION: 1. Bilateral carotid bifurcation plaque resulting in  less than 50% diameter ICA stenosis. 2. Antegrade bilateral vertebral arterial flow.   07/2019 echo IMPRESSIONS     1. Left ventricular ejection fraction, by estimation, is 55 to 60%. The  left ventricle has normal function. The left ventricle has no regional  wall motion abnormalities. Left ventricular diastolic parameters are  indeterminate.   2. Tachycardia with heart rate 135 noted near beginning of study. Same  QRS morphology and no obvious P waves - question SVT, possibly atrial  flutter.   3. Right ventricular systolic function is normal. The right ventricular  size is normal. Tricuspid regurgitation signal is inadequate for assessing  PA pressure.   4. The mitral valve is grossly normal, mild annular calcification.  Trivial mitral valve regurgitation.   5. The aortic valve is likely tricuspid, moderately calcified and not  well visualized. Cusp excursion is limited. Aortic valve regurgitation is  mild. Aortic valve mean gradient measures 12.0 mmHg. Appears that LVOT VTI  may be overestimated and the peak  AV velocity is higher than that recorded. Cannot be conclusive about  degree of aortic stenosis, but suspect moderate. Consider limited study  with further interrogation, particularly if heart rate and rhythm are in  better control.       Assessment and Plan  1. Palpitaitons/PSVT - previous monitor with just occasional PACs, no significant arrhythmias. Did catch an episode of SVT during his echo and by 09/14/2019 EKG - recent symptoms, increase lopressor to 37.5mg  bid. Discussed valsalva maneuvers, encouraged to use his prn dilt for sustained episodes     2.  Aortic stenosis - mild to mod AS by last echo - will repeat echo  3. Carotid stenosis - repeat carotid US      Arnoldo Lenis, M.D.

## 2021-10-29 ENCOUNTER — Ambulatory Visit (INDEPENDENT_AMBULATORY_CARE_PROVIDER_SITE_OTHER): Payer: Medicare Other | Admitting: General Surgery

## 2021-10-29 DIAGNOSIS — D171 Benign lipomatous neoplasm of skin and subcutaneous tissue of trunk: Secondary | ICD-10-CM

## 2021-10-29 NOTE — Progress Notes (Signed)
[facility]  I am calling the patient for post operative evaluation. This is not a billable encounter as it is under the [facility] charges for the surgery.  The patient had a lipoma removed on his chest on 7/13. I could not get the patient on the home number as it was busy after multiple calls. I tried the wife's cell number and left a VM for her to call the office.   Pathology: Mature adipose tissue consistent with lipoma.   Will see the patient PRN.   Curlene Labrum, MD [facility] [address] [place], [place] [address] [phone] (office)

## 2021-10-30 DIAGNOSIS — M5416 Radiculopathy, lumbar region: Secondary | ICD-10-CM | POA: Diagnosis not present

## 2021-11-01 ENCOUNTER — Ambulatory Visit (INDEPENDENT_AMBULATORY_CARE_PROVIDER_SITE_OTHER): Payer: Medicare Other

## 2021-11-01 DIAGNOSIS — I6522 Occlusion and stenosis of left carotid artery: Secondary | ICD-10-CM | POA: Diagnosis not present

## 2021-11-01 DIAGNOSIS — I350 Nonrheumatic aortic (valve) stenosis: Secondary | ICD-10-CM

## 2021-11-01 DIAGNOSIS — I6529 Occlusion and stenosis of unspecified carotid artery: Secondary | ICD-10-CM

## 2021-11-02 LAB — ECHOCARDIOGRAM COMPLETE
AR max vel: 1.11 cm2
AV Area VTI: 1.3 cm2
AV Area mean vel: 1.13 cm2
AV Mean grad: 24.4 mmHg
AV Peak grad: 43 mmHg
AV Vena cont: 0.31 cm
Ao pk vel: 3.28 m/s
Area-P 1/2: 2.37 cm2
Calc EF: 67.6 %
MV M vel: 2.92 m/s
MV Peak grad: 34.1 mmHg
P 1/2 time: 454 msec
S' Lateral: 2.89 cm
Single Plane A2C EF: 65.6 %
Single Plane A4C EF: 69.1 %

## 2021-11-05 ENCOUNTER — Encounter: Payer: Self-pay | Admitting: *Deleted

## 2021-11-07 ENCOUNTER — Encounter: Payer: Self-pay | Admitting: Cardiology

## 2021-11-07 ENCOUNTER — Other Ambulatory Visit: Payer: Self-pay | Admitting: *Deleted

## 2021-11-07 ENCOUNTER — Ambulatory Visit (HOSPITAL_COMMUNITY)
Admission: RE | Admit: 2021-11-07 | Discharge: 2021-11-07 | Disposition: A | Discharge status: Home / Self Care | Payer: Medicare Other | Source: Ambulatory Visit | Attending: Internal Medicine | Admitting: Internal Medicine

## 2021-11-07 DIAGNOSIS — R059 Cough, unspecified: Secondary | ICD-10-CM | POA: Insufficient documentation

## 2021-11-07 DIAGNOSIS — J329 Chronic sinusitis, unspecified: Secondary | ICD-10-CM | POA: Insufficient documentation

## 2021-11-07 DIAGNOSIS — R079 Chest pain, unspecified: Secondary | ICD-10-CM

## 2021-11-07 DIAGNOSIS — R911 Solitary pulmonary nodule: Secondary | ICD-10-CM | POA: Diagnosis not present

## 2021-11-07 MED ORDER — METOPROLOL TARTRATE 37.5 MG PO TABS: 37.5000 mg | ORAL_TABLET | Freq: Two times a day (BID) | ORAL | 3 refills | Status: DC

## 2021-11-07 NOTE — Telephone Encounter (Signed)
Request quantity correction for metoprolol sent to [facility] for 90 day supply Advised that updated rx would be sent

## 2021-11-15 ENCOUNTER — Encounter: Payer: Self-pay | Admitting: Hematology

## 2021-11-15 ENCOUNTER — Inpatient Hospital Stay: Payer: Medicare Other | Attending: Hematology | Admitting: Hematology

## 2021-11-15 ENCOUNTER — Other Ambulatory Visit: Payer: Self-pay

## 2021-11-15 VITALS — BP 113/65 | HR 69 | Temp 97.6°F | Resp 18 | Ht 73.0 in | Wt 167.5 lb

## 2021-11-15 DIAGNOSIS — R911 Solitary pulmonary nodule: Secondary | ICD-10-CM

## 2021-11-15 DIAGNOSIS — C3490 Malignant neoplasm of unspecified part of unspecified bronchus or lung: Secondary | ICD-10-CM | POA: Diagnosis not present

## 2021-11-15 DIAGNOSIS — C9110 Chronic lymphocytic leukemia of B-cell type not having achieved remission: Secondary | ICD-10-CM | POA: Insufficient documentation

## 2021-11-15 DIAGNOSIS — R918 Other nonspecific abnormal finding of lung field: Secondary | ICD-10-CM | POA: Diagnosis not present

## 2021-11-15 DIAGNOSIS — R0789 Other chest pain: Secondary | ICD-10-CM | POA: Diagnosis not present

## 2021-11-15 MED ORDER — HYDROCODONE-ACETAMINOPHEN 10-325 MG PO TABS: 1.0000 | ORAL_TABLET | ORAL | 0 refills | Status: DC | PRN

## 2021-11-15 NOTE — Patient Instructions (Signed)
[place] at [facility] Discharge Instructions  You were seen and examined today by Dr. Delton Coombes. Dr. Delton Coombes is a medical oncologist, meaning that he specializes in the treatment of cancer diagnoses. Dr. Delton Coombes discussed your past medical history, family history of cancers, and the events that led to you being here today.  You were referred back to Dr. Delton Coombes due to a new abnormal CT scan. There were multiple spots in your left lung concerning for cancer with lymph node involvement. There was also an area noted in one of the ribs on your right side that is likely causing your pain.  Dr. Delton Coombes has recommended a PET scan and a brain MRI. These are for staging purposes. Following the PET scan, Dr. Delton Coombes will order a biopsy.  You will follow-up with Dr. Delton Coombes one week after the biopsy.   Thank you for choosing [place] at [facility] to provide your oncology and hematology care.  To afford each patient quality time with our provider, please arrive at least 15 minutes before your scheduled appointment time.   If you have a lab appointment with the [facility] please come in thru the Main Entrance and check in at the main information desk.  You need to re-schedule your appointment should you arrive 10 or more minutes late.  We strive to give you quality time with our providers, and arriving late affects you and other patients whose appointments are after yours.  Also, if you no show three or more times for appointments you may be dismissed from the clinic at the providers discretion.     Again, thank you for choosing [facility].  Our hope is that these requests will decrease the amount of time that you wait before being seen by our physicians.       _____________________________________________________________  Should you have questions after your visit to [facility], please contact our office  at [phone] and follow the prompts.  Our office hours are 8:00 a.m. and 4:30 p.m. Monday - Friday.  Please note that voicemails left after 4:00 p.m. may not be returned until the following business day.  We are closed weekends and major holidays.  You do have access to a nurse 24-7, just call the main number to the clinic [phone] and do not press any options, hold on the line and a nurse will answer the phone.    For prescription refill requests, have your pharmacy contact our office and allow 72 hours.

## 2021-11-15 NOTE — Progress Notes (Signed)
[facility] NOTE  Patient Care Team: Redmond School, MD as PCP - General (Internal Medicine) Harl Bowie, Alphonse Guild, MD as PCP - Cardiology (Cardiology) Rothbart, Cristopher Estimable, MD (Inactive) as Attending Physician (Cardiology) Ninetta Lights, MD (Inactive) as Attending Physician (Orthopedic Surgery) Derek Jack, MD as Medical Oncologist (Medical Oncology) Brien Mates, RN as Oncology Nurse Navigator (Medical Oncology)  CHIEF COMPLAINTS/PURPOSE OF CONSULTATION:  Left upper lobe lung masses.  HISTORY OF PRESENTING ILLNESS:  Alfred Brown 71 y.o. male is seen in consultation today for newly diagnosed left upper lobe lung masses.  A CT chest without contrast on 11/07/2021 done for sinus problems showed irregular bandlike area in the left upper lobe.  A second 1.4 cm solid nodule is identified adjacent to a 1.2 cm cystic component in the posterior left upper lobe.  Left hilar densities suspicious for adenopathy.  There is also destruction of the right fourth rib anteriorly.  He complained of right chest wall pain which started a month ago which is worse on coughing and sharp in nature.  He is taking hydrocodone 10 mg every 4-6 hours as needed which is helping control the pain.  He reported this all started as a cough with yellowish discoloration in the middle of May and had blood-streaked sputum once or twice.  He lives at home with his wife.  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  He is a current active smoker 1 pack/day for the last 46 years.  He had 3 brothers who died of lung cancer.  MEDICAL HISTORY:  Past Medical History:  Diagnosis Date   Aortic valve disorder    Mild insufficiency   Chronic lymphocytic leukemia ([place])    01/2012: WBC-90.2, H&H-15.1/45.3, platelets-185 03/31/12: Verified with Dr. Tressie Stalker that no precautions or modification of medical regime are required prior to orthopaedic surgery.    CLL (chronic lymphocytic leukemia)  (HCC)    CMV (cytomegalovirus) (HCC)    COPD (chronic obstructive pulmonary disease) (HCC)    Depression with anxiety    DJD (degenerative joint disease)    GERD (gastroesophageal reflux disease)    Hyperlipidemia    Hypogammaglobulinemia ([place]) 09/23/2019   Lipoma    left chest wall   Palpitations 2004   PVCs; borderline stress nuclear in 2004, negative in 2007; Echo 2007; AV-sclerotic, very mild AI   Pneumonia    Right bundle branch block    + left posterior fascicular block   Tobacco abuse    80 pack years    SURGICAL HISTORY: Past Surgical History:  Procedure Laterality Date   COLONOSCOPY  01/31/2012   Negative screening study   GANGLION CYST EXCISION  04/02/1995   left wrist   LIPOMA EXCISION Left 10/11/2021   chest wall   ROTATOR CUFF REPAIR  04/02/1995   right   SHOULDER ARTHROSCOPY WITH SUBACROMIAL DECOMPRESSION  04/09/2012   Procedure: SHOULDER ARTHROSCOPY WITH SUBACROMIAL DECOMPRESSION;  Surgeon: Ninetta Lights, MD;  Location: [facility];  Service: Orthopedics;  Laterality: Left;  LEFT SHOULDER ARTHROSCOPY, SUBACROMIAL DECOMPRESSION, PARTIAL ACROMIOPLASTY WITH CORACOACROMIAL RELEASE, DISTAL CLAVICULECTOMY WITH ROTATOR CUFF REPAIR, DEBRIDEMENT OF LABRUM    SOCIAL HISTORY: Social History   Socioeconomic History   Marital status: Married    Spouse name: Not on file   Number of children: Not on file   Years of education: Not on file   Highest education level: Not on file  Occupational History   Not on file  Tobacco Use   Smoking status: Every Day    Packs/day: 1.00    Years: 45.00    Total pack years: 45.00    Types: Cigarettes    Start date: 12/03/1963   Smokeless tobacco: Never  Substance and Sexual Activity   Alcohol use: No    Alcohol/week: 0.0 standard drinks of alcohol   Drug use: No   Sexual activity: Not on file  Other Topics Concern   Not on file  Social History Narrative   Not on file   Social Determinants of Health    Financial Resource Strain: Not on file  Food Insecurity: Not on file  Transportation Needs: Not on file  Physical Activity: Not on file  Stress: Not on file  Social Connections: Not on file  Intimate Partner Violence: Not on file    FAMILY HISTORY: Family History  Problem Relation Age of Onset   Stroke Mother    Heart attack Father    Cancer Sister        colon   Colon cancer Sister    Cancer Brother        2 brothers died with lung cancer    ALLERGIES:  has No Known Allergies.  MEDICATIONS:  Current Outpatient Medications  Medication Sig Dispense Refill   albuterol (PROVENTIL HFA;VENTOLIN HFA) 108 (90 BASE) MCG/ACT inhaler Inhale 2 puffs into the lungs every 6 (six) hours as needed for wheezing or shortness of breath.      ALPRAZolam (XANAX) 1 MG tablet Take 1 mg by mouth 3 (three) times daily as needed for anxiety.      aspirin 325 MG tablet Take 325 mg by mouth daily.     buPROPion (WELLBUTRIN SR) 150 MG 12 hr tablet Take 150 mg by mouth daily.     calcium carbonate (OS-CAL) 600 MG TABS Take 600 mg by mouth daily.     celecoxib (CELEBREX) 100 MG capsule Take 100 mg by mouth 2 (two) times daily.     clotrimazole-betamethasone (LOTRISONE) cream Apply 1 application topically daily as needed. Affected area     diltiazem (CARDIZEM) 30 MG tablet Take 30 mg by mouth as needed.     fish oil-omega-3 fatty acids 1000 MG capsule Take 1 g by mouth daily.     fluticasone (FLONASE) 50 MCG/ACT nasal spray Place 2 sprays into the nose daily.     Loratadine 10 MG CAPS Take 10 mg by mouth daily.     lovastatin (MEVACOR) 20 MG tablet Take 20 mg by mouth daily.     Metoprolol Tartrate 37.5 MG TABS Take 37.5 mg by mouth 2 (two) times daily. 270 tablet 3   Multiple Vitamin (MULTIVITAMIN) tablet Take 1 tablet by mouth daily.     niacin (NIASPAN) 500 MG CR tablet Take 500 mg by mouth daily.     valACYclovir (VALTREX) 1000 MG tablet Take 4,000 mg by mouth as needed.     valACYclovir (VALTREX)  500 MG tablet Take 500 mg by mouth daily.     HYDROcodone-acetaminophen (NORCO) 10-325 MG tablet Take 1 tablet by mouth every 4 (four) hours as needed. 180 tablet 0   No current facility-administered medications for this visit.    REVIEW OF SYSTEMS:   Constitutional: Denies fevers, chills or abnormal night sweats Eyes: Denies blurriness of vision, double vision or watery eyes Ears, nose, mouth, throat, and face: Denies mucositis or sore throat Respiratory: Denies cough, dyspnea or wheezes.  Positive for right anterior chest wall pain. Cardiovascular: Denies  palpitation, chest discomfort or lower extremity swelling Gastrointestinal:  Denies nausea, heartburn or change in bowel habits Skin: Denies abnormal skin rashes Lymphatics: Denies new lymphadenopathy or easy bruising Neurological:Denies numbness, tingling or new weaknesses Behavioral/Psych: Mood is stable, no new changes  All other systems were reviewed with the patient and are negative.  PHYSICAL EXAMINATION: ECOG PERFORMANCE STATUS: 1 - Symptomatic but completely ambulatory  Vitals:   11/15/21 0812  BP: 113/65  Pulse: 69  Resp: 18  Temp: 97.6 F (36.4 C)  SpO2: 95%   Filed Weights   11/15/21 0812  Weight: 167 lb 8 oz (76 kg)    GENERAL:alert, no distress and comfortable SKIN: skin color, texture, turgor are normal, no rashes or significant lesions EYES: normal, conjunctiva are pink and non-injected, sclera clear OROPHARYNX:no exudate, no erythema and lips, buccal mucosa, and tongue normal  NECK: supple, thyroid normal size, non-tender, without nodularity LYMPH:  no palpable lymphadenopathy in the cervical, axillary or inguinal LUNGS: clear to auscultation and percussion with normal breathing effort.  Positive for tenderness in the right anterior chest wall rib. HEART: regular rate & rhythm and no murmurs and no lower extremity edema ABDOMEN:abdomen soft, non-tender and normal bowel sounds Musculoskeletal:no cyanosis  of digits and no clubbing  PSYCH: alert & oriented x 3 with fluent speech NEURO: no focal motor/sensory deficits  LABORATORY DATA:  I have reviewed the data as listed Lab Results  Component Value Date   WBC 143.3 (HH) 06/04/2021   HGB 14.4 06/04/2021   HCT 46.5 06/04/2021   MCV 99.4 06/04/2021   PLT 142 (L) 06/04/2021     Chemistry      Component Value Date/Time   NA 143 06/04/2021 1244   K 5.4 (H) 06/04/2021 1244   CL 111 06/04/2021 1244   CO2 26 06/04/2021 1244   BUN 23 06/04/2021 1244   CREATININE 1.06 06/04/2021 1244      Component Value Date/Time   CALCIUM 9.1 06/04/2021 1244   ALKPHOS 126 06/04/2021 1244   AST 19 06/04/2021 1244   ALT 14 06/04/2021 1244   BILITOT 0.4 06/04/2021 1244       RADIOGRAPHIC STUDIES: I have personally reviewed the radiological images as listed and agreed with the findings in the report. CT CHEST WO CONTRAST  Result Date: 11/08/2021 CLINICAL DATA:  Chronic sinus issues. EXAM: CT CHEST WITHOUT CONTRAST TECHNIQUE: Multidetector CT imaging of the chest was performed following the standard protocol without IV contrast. RADIATION DOSE REDUCTION: This exam was performed according to the departmental dose-optimization program which includes automated exposure control, adjustment of the mA and/or kV according to patient size and/or use of iterative reconstruction technique. COMPARISON:  Chest x-ray on 09/06/2021, CT chest on 06/02/2019 FINDINGS: Cardiovascular: Heart size is normal. There is dense atherosclerotic calcification of the coronary arteries and aortic root. No pericardial effusion. There is atherosclerotic calcification of the thoracic aorta. No associated aneurysm. The noncontrast appearance of the pulmonary arteries is unremarkable. Mediastinum/Nodes: The visualized portion of the thyroid gland has a normal appearance. Esophagus is unremarkable. There is increased LEFT hilar density, consistent with adenopathy. However, discrete nodes cannot  be measured given the lack of intravenous contrast. RIGHT hilar region is unremarkable. Largest precarinal lymph node is 9 millimeters in short axis on image 54 of series 3. Bilateral axillary lymph nodes are mildly prominent but shows long-term stability since 2013. Lungs/Pleura: peripherally also in the UPPER lobe, there is irregular/nodule adjacent to cystic lucency. The solid portion of the nodule is 1.5  x 1.3 centimeters. The cystic portion is 1.2 centimeters. These densities are confluent with the LEFT hilar region. No pleural effusions. No focal consolidations. RIGHT lung is clear. Airways are patent. Upper Abdomen: No acute abnormality.  Gallbladder is present. Musculoskeletal: There is cortical destruction of the RIGHT anterior fourth rib, suspicious for metastatic disease. IMPRESSION: 1. Findings are suspicious for bronchogenic carcinoma in the LEFT UPPER lobe. There are 2 components. An irregular bandlike mass traverses the LEFT UPPER lobe. A second 1.4 centimeter solid nodule is identified adjacent to a 1.2 cystic component in the posterior LEFT UPPER lobe. These changes are contiguous with prominent LEFT hilar densities suspicious for adenopathy. 2. Per Fleischner Society Guidelines, consider a non-contrast Chest CT at 3 months, a PET/CT, or tissue sampling. These guidelines do not apply to immunocompromised patients and patients with cancer. Follow up in patients with significant comorbidities as clinically warranted. For lung cancer screening, adhere to Lung-RADS guidelines. Reference: Radiology. 2017; 284(1):228-43. 3. Suspect metastatic disease of the RIGHT anterior fourth rib. 4.  Aortic atherosclerosis.  (ICD10-I70.0) These results will be called to the ordering clinician or representative by the Radiologist Assistant, and communication documented in the PACS or [employer]. Electronically Signed   By: Nolon Nations M.D.   On: 11/08/2021 14:27   CT MAXILLOFACIAL WO CONTRAST  Result  Date: 11/07/2021 CLINICAL DATA:  Pt c/o chronic sinus issues EXAM: CT MAXILLOFACIAL WITHOUT CONTRAST TECHNIQUE: Multidetector CT imaging of the maxillofacial structures was performed. Multiplanar CT image reconstructions were also generated. RADIATION DOSE REDUCTION: This exam was performed according to the departmental dose-optimization program which includes automated exposure control, adjustment of the mA and/or kV according to patient size and/or use of iterative reconstruction technique. COMPARISON:  None Available. FINDINGS: Osseous: No fracture or mandibular dislocation. No destructive process. Orbits: Negative. No traumatic or inflammatory finding. Sinuses: Clear. Soft tissues: Negative. Limited intracranial: No significant or unexpected finding. IMPRESSION: Unremarkable CT max face. Electronically Signed   By: Iven Finn M.D.   On: 11/07/2021 19:28   VAS US CAROTID  Result Date: 11/02/2021 Carotid Arterial Duplex Study Patient Name:  KYPTON ELTRINGHAM  Date of Exam:   11/01/2021 Medical Rec #: [id number]       Accession #:    [id number] Date of Birth: [birth year]        Patient Gender: M Patient Age:   21 years Exam Location:  [facility] Procedure:      VAS US CAROTID Referring Phys: Carlyle Dolly --------------------------------------------------------------------------------  Indications:       Carotid artery disease. Risk Factors:      Hypertension, hyperlipidemia, current smoker. Comparison Study:  On 02/28/20 at [facility] carotid Duplex showed the right peak                    systolic velocity to be 94 cm/s and end diastolic veloctiy to                    be 33 cm/s. The left peak systolic velocity was 536 cm/s and                    end diastolic velocity to be 18 cm/s. Performing Technologist: Jeneen Montgomery RDMS, RVT, RDCS  Examination Guidelines: A complete evaluation includes B-mode imaging, spectral Doppler, color Doppler, and power Doppler as needed of all accessible portions of each vessel. Bilateral  testing is considered an integral part of a complete examination. Limited examinations for reoccurring indications may be performed  as noted.  Right Carotid Findings: +----------+--------+--------+--------+-------------------------+--------+           PSV cm/sEDV cm/sStenosisPlaque Description       Comments +----------+--------+--------+--------+-------------------------+--------+ CCA Prox  107     22                                                +----------+--------+--------+--------+-------------------------+--------+ CCA Distal80      19                                                +----------+--------+--------+--------+-------------------------+--------+ ICA Prox  67      16              calcific and heterogenous         +----------+--------+--------+--------+-------------------------+--------+ ICA Mid   72      21      1-39%                                     +----------+--------+--------+--------+-------------------------+--------+ ICA Distal76      23                                                +----------+--------+--------+--------+-------------------------+--------+ ECA       157     8                                                 +----------+--------+--------+--------+-------------------------+--------+ +----------+--------+-------+----------------+-------------------+           PSV cm/sEDV cmsDescribe        Arm Pressure (mmHG) +----------+--------+-------+----------------+-------------------+ Subclavian184     0      Multiphasic, [id number]                 +----------+--------+-------+----------------+-------------------+ +---------+--------+--+--------+-+---------+ VertebralPSV cm/s40EDV cm/s9Antegrade +---------+--------+--+--------+-+---------+  Left Carotid Findings: +----------+--------+--------+--------+-------------------------+--------+           PSV cm/sEDV cm/sStenosisPlaque Description       Comments  +----------+--------+--------+--------+-------------------------+--------+ CCA Prox  99      18                                                +----------+--------+--------+--------+-------------------------+--------+ CCA Mid   83      17      <50%    calcific and heterogenous         +----------+--------+--------+--------+-------------------------+--------+ CCA Distal79      20                                                +----------+--------+--------+--------+-------------------------+--------+ ICA Prox  93      19      1-39%  calcific and heterogenous         +----------+--------+--------+--------+-------------------------+--------+ ICA Mid   81      22                                                +----------+--------+--------+--------+-------------------------+--------+ ICA Distal75      25                                                +----------+--------+--------+--------+-------------------------+--------+ ECA       105     8                                                 +----------+--------+--------+--------+-------------------------+--------+ +----------+--------+--------+----------------+-------------------+           PSV cm/sEDV cm/sDescribe        Arm Pressure (mmHG) +----------+--------+--------+----------------+-------------------+ [id number]      0       Multiphasic, [id number]                 +----------+--------+--------+----------------+-------------------+ +---------+--------+--+--------+--+---------+ VertebralPSV cm/s52EDV cm/s13Antegrade +---------+--------+--+--------+--+---------+   Summary: Right Carotid: Velocities in the right ICA are consistent with a 1-39% stenosis. Left Carotid: Velocities in the left ICA are consistent with a 1-39% stenosis.               Non-hemodynamically significant plaque <50% noted in the CCA. Vertebrals:  Bilateral vertebral arteries demonstrate antegrade flow. Subclavians: Normal flow  hemodynamics were seen in bilateral subclavian              arteries. *See table(s) above for measurements and observations.  Electronically signed by Larae Grooms MD on 11/02/2021 at 9:35:54 PM.    Final    ECHOCARDIOGRAM COMPLETE  Result Date: 11/02/2021    ECHOCARDIOGRAM REPORT   Patient Name:   ANDRZEJ SCULLY Date of Exam: 11/01/2021 Medical Rec #:  [id number]      Height:       73.0 in Accession #:    [id number]     Weight:       174.2 lb Date of Birth:  1951-03-26       BSA:          2.029 m Patient Age:    71 years       BP:           126/60 mmHg Patient Gender: M              HR:           77 bpm. Exam Location:  [facility] Procedure: 2D Echo, Cardiac Doppler, Color Doppler and Strain Analysis Indications:    I35.0 Nonrheumatic aortic (valve) stenosis  History:        Patient has prior history of Echocardiogram examinations, most                 recent 08/12/2019. CLL, Covid 2021, Hx Biscupid aortic valve and                 Aortic Valve Disease, Arrythmias:RBBB and Tachycardia; Risk  Factors:Hypertension, Dyslipidemia and Current Smoker.  Sonographer:    Jeneen Montgomery RDMS, RVT, RDCS Referring Phys: [id number] [place]  1. Left ventricular ejection fraction, by estimation, is 60 to 65%. The left ventricle has normal function. The left ventricle has no regional wall motion abnormalities. Left ventricular diastolic parameters are consistent with Grade I diastolic dysfunction (impaired relaxation).  2. Right ventricular systolic function is normal. The right ventricular size is normal. Tricuspid regurgitation signal is inadequate for assessing PA pressure.  3. The mitral valve is normal in structure. Trivial mitral valve regurgitation. No evidence of mitral stenosis.  4. The aortic valve has an indeterminant number of cusps. There is severe calcifcation of the aortic valve. There is severe thickening of the aortic valve. Aortic valve regurgitation is moderate. Moderate aortic  valve stenosis. Aortic valve mean gradient measures 24.4 mmHg. Aortic valve peak gradient measures 43.0 mmHg. Aortic valve area, by VTI measures 1.30 cm. Comparison(s): Echocardiogram done 08/12/19 showed an EF of 55-60% with moderate AS and an AV Mean Grad of12 mmHg. FINDINGS  Left Ventricle: Left ventricular ejection fraction, by estimation, is 60 to 65%. The left ventricle has normal function. The left ventricle has no regional wall motion abnormalities. The left ventricular internal cavity size was normal in size. There is  no left ventricular hypertrophy. Left ventricular diastolic parameters are consistent with Grade I diastolic dysfunction (impaired relaxation). Normal left ventricular filling pressure. Right Ventricle: The right ventricular size is normal. Right vetricular wall thickness was not well visualized. Right ventricular systolic function is normal. Tricuspid regurgitation signal is inadequate for assessing PA pressure. Left Atrium: Left atrial size was normal in size. Right Atrium: Right atrial size was normal in size. Pericardium: There is no evidence of pericardial effusion. Mitral Valve: The mitral valve is normal in structure. Trivial mitral valve regurgitation. No evidence of mitral valve stenosis. Tricuspid Valve: The tricuspid valve is normal in structure. Tricuspid valve regurgitation is not demonstrated. No evidence of tricuspid stenosis. Aortic Valve: The aortic valve has an indeterminant number of cusps. There is severe calcifcation of the aortic valve. There is severe thickening of the aortic valve. There is severe aortic valve annular calcification. Aortic valve regurgitation is moderate. Aortic regurgitation PHT measures 454 msec. Moderate aortic stenosis is present. Aortic valve mean gradient measures 24.4 mmHg. Aortic valve peak gradient measures 43.0 mmHg. Aortic valve area, by VTI measures 1.30 cm. Pulmonic Valve: The pulmonic valve was not well visualized. Pulmonic valve  regurgitation is not visualized. No evidence of pulmonic stenosis. Aorta: The aortic root is normal in size and structure. Venous: The inferior vena cava was not well visualized. IAS/Shunts: The interatrial septum was not well visualized.  LEFT VENTRICLE PLAX 2D LVIDd:         5.47 cm      Diastology LVIDs:         2.89 cm      LV e' medial:    7.72 cm/s LV PW:         0.90 cm      LV E/e' medial:  9.4 LV IVS:        0.69 cm      LV e' lateral:   7.07 cm/s LVOT diam:     2.00 cm      LV E/e' lateral: 10.2 LV SV:         94 LV SV Index:   46           2D Longitudinal Strain LVOT  Area:     3.14 cm     2D Strain GLS (A2C):   -18.1 %                             2D Strain GLS (A3C):   -17.2 %                             2D Strain GLS (A4C):   -21.7 % LV Volumes (MOD)            2D Strain GLS Avg:     -19.0 % LV vol d, MOD A2C: 129.0 ml LV vol d, MOD A4C: 141.0 ml LV vol s, MOD A2C: 44.4 ml LV vol s, MOD A4C: 43.5 ml LV SV MOD A2C:     84.6 ml LV SV MOD A4C:     141.0 ml LV SV MOD BP:      92.6 ml RIGHT VENTRICLE RV S prime:     13.10 cm/s TAPSE (M-mode): 2.5 cm LEFT ATRIUM             Index        RIGHT ATRIUM           Index LA diam:        2.80 cm 1.38 cm/m   RA Area:     14.20 cm LA Vol (A2C):   54.0 ml 26.61 ml/m  RA Volume:   38.50 ml  18.98 ml/m LA Vol (A4C):   56.1 ml 27.65 ml/m LA Biplane Vol: 54.7 ml 26.96 ml/m  AORTIC VALVE AV Area (Vmax):    1.11 cm AV Area (Vmean):   1.13 cm AV Area (VTI):     1.30 cm AV Vmax:           327.80 cm/s AV Vmean:          233.000 cm/s AV VTI:            0.722 m AV Peak Grad:      43.0 mmHg AV Mean Grad:      24.4 mmHg LVOT Vmax:         116.00 cm/s LVOT Vmean:        83.700 cm/s LVOT VTI:          0.300 m LVOT/AV VTI ratio: 0.42 AI PHT:            454 msec AR Vena Contracta: 0.30 cm  AORTA Ao Root diam: 3.70 cm Ao Asc diam:  3.30 cm MITRAL VALVE MV Area (PHT): 2.37 cm     SHUNTS MV Decel Time: 320 msec     Systemic VTI:  0.30 m MR Peak grad: 34.1 mmHg     Systemic Diam:  2.00 cm MR Vmax:      292.00 cm/s MV E velocity: 72.20 cm/s MV A velocity: 106.00 cm/s MV E/A ratio:  0.68 Carlyle Dolly MD Electronically signed by Carlyle Dolly MD Signature Date/Time: 11/02/2021/11:46:39 AM    Final    DG Ribs Unilateral Right  Result Date: 10/22/2021 CLINICAL DATA:  Right-sided rib pain EXAM: RIGHT RIBS - 2 VIEW COMPARISON:  09/06/2021 FINDINGS: No fracture or other bone lesions are seen involving the ribs. IMPRESSION: Negative. Electronically Signed   By: Donavan Foil M.D.   On: 10/22/2021 18:33    ASSESSMENT:  1.  Left upper lobe lung masses: - Presentation with cough with yellowish expectoration in the  middle of May. - Right chest wall pain started in July, worse on coughing and sharp in nature. - No B symptoms. - CT chest (11/07/2021): Irregular bandlike mass traverses the left upper lobe.  1.4 cm solid nodule is identified adjacent to 1.2 cm cystic component in the posterior left upper lobe.  There is prominent left hilar density suspicious for adenopathy.  Cortical destruction of the right anterior fourth rib suspicious for metastatic disease.  2.  Stage II CLL by [facility] system: - Diagnosed around 2010, observation since then. 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  3.  Social/family history:   PLAN:  1.  Left upper lobe lung masses: - Reviewed images of the CT scan with the patient in detail. - Recommend PET CT scan and brain MRI. - We will also order biopsy based on the PET scan. - RTC 1 week after biopsy to discuss results and further treatment plan.  2.  Right chest wall pain: - Likely from destruction of the right anterior fourth rib. - I will send a prescription refill for hydrocodone 10/325 every 4 hours as needed. - May consider radiation therapy after biopsy.   All questions were answered. The patient knows to call the clinic with any  problems, questions or concerns.     Derek Jack, MD 11/15/2021 5:21 PM

## 2021-11-19 DIAGNOSIS — M4310 Spondylolisthesis, site unspecified: Secondary | ICD-10-CM | POA: Diagnosis not present

## 2021-11-19 DIAGNOSIS — M5416 Radiculopathy, lumbar region: Secondary | ICD-10-CM | POA: Diagnosis not present

## 2021-11-20 ENCOUNTER — Ambulatory Visit (HOSPITAL_COMMUNITY): Payer: Medicare Other

## 2021-11-20 ENCOUNTER — Other Ambulatory Visit (HOSPITAL_COMMUNITY): Payer: Medicare Other

## 2021-11-20 ENCOUNTER — Encounter (HOSPITAL_COMMUNITY): Payer: Self-pay

## 2021-11-21 ENCOUNTER — Other Ambulatory Visit: Payer: Self-pay

## 2021-11-21 ENCOUNTER — Encounter: Payer: Self-pay | Admitting: Nurse Practitioner

## 2021-11-21 ENCOUNTER — Ambulatory Visit (INDEPENDENT_AMBULATORY_CARE_PROVIDER_SITE_OTHER): Payer: Medicare Other

## 2021-11-21 ENCOUNTER — Ambulatory Visit (INDEPENDENT_AMBULATORY_CARE_PROVIDER_SITE_OTHER): Payer: Medicare Other | Admitting: Nurse Practitioner

## 2021-11-21 VITALS — BP 120/60 | HR 70 | Temp 98.2°F | Ht 73.0 in | Wt 165.8 lb

## 2021-11-21 DIAGNOSIS — J441 Chronic obstructive pulmonary disease with (acute) exacerbation: Secondary | ICD-10-CM

## 2021-11-21 DIAGNOSIS — R0789 Other chest pain: Secondary | ICD-10-CM | POA: Diagnosis not present

## 2021-11-21 DIAGNOSIS — R911 Solitary pulmonary nodule: Secondary | ICD-10-CM

## 2021-11-21 DIAGNOSIS — R059 Cough, unspecified: Secondary | ICD-10-CM | POA: Diagnosis not present

## 2021-11-21 DIAGNOSIS — J411 Mucopurulent chronic bronchitis: Secondary | ICD-10-CM | POA: Diagnosis not present

## 2021-11-21 DIAGNOSIS — R0781 Pleurodynia: Secondary | ICD-10-CM | POA: Diagnosis not present

## 2021-11-21 DIAGNOSIS — Z720 Tobacco use: Secondary | ICD-10-CM

## 2021-11-21 DIAGNOSIS — J45901 Unspecified asthma with (acute) exacerbation: Secondary | ICD-10-CM | POA: Diagnosis not present

## 2021-11-21 MED ORDER — OXYCODONE HCL 10 MG PO TABS
10.0000 mg | ORAL_TABLET | ORAL | 0 refills | Status: DC | PRN
Start: 2021-11-21 — End: 2021-12-04

## 2021-11-21 MED ORDER — TRELEGY ELLIPTA 100-62.5-25 MCG/ACT IN AEPB: 1.0000 | INHALATION_SPRAY | Freq: Every day | RESPIRATORY_TRACT | 5 refills | Status: DC

## 2021-11-21 MED ORDER — LIDOCAINE 5 % EX PTCH: 1.0000 | MEDICATED_PATCH | CUTANEOUS | 0 refills | Status: DC

## 2021-11-21 NOTE — Progress Notes (Signed)
@Patient  ID: Alfred Brown, male    DOB: 09-10-1950, 71 y.o.   MRN: [id number]  No chief complaint on file.   Referring provider: Redmond School, MD  HPI: 71 year old male, active smoker followed for COPD. He is a patient of Dr. Gustavus Bryant and last seen in office 09/17/2019. Past medical history significant for PSVT, AS, GERD, Peyronie's disease, HLD, CLL.   In May 2023, he began experiencing sinus and chest congestion.  He was seen at urgent care on 08/27/2021 and treated for acute sinusitis with Augmentin 7-day course.  He had limited improvement in his symptoms over the following months so CT chest and CT PE sinus was ordered for further evaluation.  This was completed 11/07/2021 with findings concerning for bronchogenic carcinoma with metastatic disease to the right fourth rib. He was seen by his oncologist 11/15/2021 who ordered PET scan and brain MRI, which have yet to be completed.   TEST/EVENTS:  08/03/2019 PFTs: FVC 87, FEV1 88, ratio 76, TLC 104, DLCOunc 63.  Borderline bronchodilator response (9%); significant mid flow reversibility 11/01/2021 echocardiogram: EF 60 to 65%.  G1 DD.  RV size and function normal.  Unable to measure PASP.  Trivial MR.  Moderate AR and AS.  11/07/2021 CT sinus: Unremarkable with clear sinuses. 11/07/2021 CT chest without contrast: Atherosclerotic calcifications.  There is increased left hilar density, consistent with adenopathy.  Bilateral axillary lymph nodes are mildly prominent but stable since 2013.  There is an irregular bandlike area in the left upper lobe as well as a solid 1.5 x 1.3 cm nodule.  There is an adjacent cystic portion measuring 1.2 cm.  There is cortical destruction of the right anterior fourth rib, concerning for metastatic disease.  11/21/2021: Today - acute Patient presents today with his wife for acute visit.  He has had a productive cough for the last few months.  Initially with some yellow sputum, now mostly cream or white.  He underwent CT  imaging for further evaluation which revealed an irregular bandlike area in the left upper lobe as well as a second 1.4 cm solid nodule adjacent to a 1.2 cm cystic component in the posterior left upper lobe.  There was also evidence of hilar adenopathy and destruction of the right fourth rib, consistent with metastatic disease.  He is currently awaiting PET scan which is scheduled for tomorrow and MRI of the brain the following day.  Today, he reports that his right-sided pain has been worse.  He was started on Norco by his oncologist and felt like his pain was being well controlled up until the last few days. Pain is dull and located over the location of his right fourth rib. He does have some tenderness when he presses on the area. Usually pain is worse when he coughs. He feels like his cough is slightly more frequent; occasional sputum production with white/cream sputum which has been his baseline for the past few months. He does have some wheezing and dyspnea upon exertion at baseline. He can walk at a normal pace and keep up with others without difficulties; however, he hasn't been able to mow the yard recently, due to getting short winded. He denies any fevers, night sweats, hemoptysis, calf pain, lower extremity swelling, orthopnea. He has an albuterol rescue inhaler, which he rarely uses because he forgets about it. Does feel like he benefits from use though. He is still smoking every day; usually about 1 ppd. He has cut back from 2 ppd.  No Known Allergies  Immunization History  Administered Date(s) Administered   Influenza,inj,Quad PF,6+ Mos 01/12/2016, 12/31/2018   Influenza-Unspecified 01/02/2015    Past Medical History:  Diagnosis Date   Aortic valve disorder    Mild insufficiency   Chronic lymphocytic leukemia ([place])    01/2012: WBC-90.2, H&H-15.1/45.3, platelets-185 03/31/12: Verified with Dr. Tressie Stalker that no precautions or modification of medical regime are required prior to  orthopaedic surgery.    CLL (chronic lymphocytic leukemia) (HCC)    CMV (cytomegalovirus) (HCC)    COPD (chronic obstructive pulmonary disease) (HCC)    Depression with anxiety    DJD (degenerative joint disease)    GERD (gastroesophageal reflux disease)    Hyperlipidemia    Hypogammaglobulinemia ([place]) 09/23/2019   Lipoma    left chest wall   Palpitations 2004   PVCs; borderline stress nuclear in 2004, negative in 2007; Echo 2007; AV-sclerotic, very mild AI   Pneumonia    Right bundle branch block    + left posterior fascicular block   Tobacco abuse    80 pack years    Tobacco History: Social History   Tobacco Use  Smoking Status Every Day   Packs/day: 1.00   Years: 45.00   Total pack years: 45.00   Types: Cigarettes   Start date: 12/03/1963  Smokeless Tobacco Never   Ready to quit: Not Answered Counseling given: Not Answered   Outpatient Medications Prior to Visit  Medication Sig Dispense Refill   albuterol (PROVENTIL HFA;VENTOLIN HFA) 108 (90 BASE) MCG/ACT inhaler Inhale 2 puffs into the lungs every 6 (six) hours as needed for wheezing or shortness of breath.      ALPRAZolam (XANAX) 1 MG tablet Take 1 mg by mouth 3 (three) times daily as needed for anxiety.      aspirin 325 MG tablet Take 325 mg by mouth daily.     buPROPion (WELLBUTRIN SR) 150 MG 12 hr tablet Take 150 mg by mouth daily.     calcium carbonate (OS-CAL) 600 MG TABS Take 600 mg by mouth daily.     celecoxib (CELEBREX) 100 MG capsule Take 100 mg by mouth 2 (two) times daily.     clotrimazole-betamethasone (LOTRISONE) cream Apply 1 application topically daily as needed. Affected area     diltiazem (CARDIZEM) 30 MG tablet Take 30 mg by mouth as needed.     fish oil-omega-3 fatty acids 1000 MG capsule Take 1 g by mouth daily.     fluticasone (FLONASE) 50 MCG/ACT nasal spray Place 2 sprays into the nose daily.     HYDROcodone-acetaminophen (NORCO) 10-325 MG tablet Take 1 tablet by mouth every 4 (four) hours as  needed. 180 tablet 0   Loratadine 10 MG CAPS Take 10 mg by mouth daily.     lovastatin (MEVACOR) 20 MG tablet Take 20 mg by mouth daily.     Metoprolol Tartrate 37.5 MG TABS Take 37.5 mg by mouth 2 (two) times daily. 270 tablet 3   Multiple Vitamin (MULTIVITAMIN) tablet Take 1 tablet by mouth daily.     niacin (NIASPAN) 500 MG CR tablet Take 500 mg by mouth daily.     valACYclovir (VALTREX) 1000 MG tablet Take 4,000 mg by mouth as needed.     valACYclovir (VALTREX) 500 MG tablet Take 500 mg by mouth daily.     No facility-administered medications prior to visit.     Review of Systems:   Constitutional: No weight loss or gain, night sweats, fevers, chills, or lassitude. +fatigue (baseline) HEENT:  No headaches, difficulty swallowing, tooth/dental problems, or sore throat. No sneezing, itching, ear ache. +nasal congestion, post nasal drip (stable) CV:  No chest pain, orthopnea, PND, swelling in lower extremities, anasarca, dizziness, palpitations, syncope Resp: +shortness of breath with exertion; productive cough (increased frequency; sputum unchanged); wheezing. No excess mucus or change in color of mucus.  No hemoptysis. No chest wall deformity GI:  No heartburn, indigestion, abdominal pain, nausea, vomiting, diarrhea, change in bowel habits, loss of appetite, bloody stools.  GU: No dysuria, change in color of urine, urgency or frequency.  No flank pain, no hematuria  Skin: No rash, lesions, ulcerations MSK:  +right sided rib pain. No joint pain or swelling.  No decreased range of motion.  No back pain. Neuro: No dizziness or lightheadedness.  Psych: No depression or anxiety. Mood stable.     Physical Exam:  BP 120/60 (BP Location: Left Arm, Patient Position: Sitting, Cuff Size: Normal)   Pulse 70   Temp 98.2 F (36.8 C) (Oral)   Ht 6\' 1"  (1.854 m)   Wt 165 lb 12.8 oz (75.2 kg)   SpO2 97%   BMI 21.87 kg/m   GEN: Pleasant, interactive, well-appearing; in no acute  distress. HEENT:  Normocephalic and atraumatic. PERRLA. Sclera white. Nasal turbinates pink, moist and patent bilaterally. No rhinorrhea present. Oropharynx pink and moist, without exudate or edema. No lesions, ulcerations, or postnasal drip.  NECK:  Supple w/ fair ROM. No JVD present. Normal carotid impulses w/o bruits. Thyroid symmetrical with no goiter or nodules palpated. No lymphadenopathy.   CV: RRR, no m/r/g, no peripheral edema. Pulses intact, +2 bilaterally. No cyanosis, pallor or clubbing. PULMONARY:  Unlabored, regular breathing. Minimal end expiratory wheezes bilaterally A&P. No accessory muscle use. No dullness to percussion. GI: BS present and normoactive. Soft, non-tender to palpation. No organomegaly or masses detected. No CVA tenderness. MSK: Tenderness over right anterior ribs. No erythema, warmth. Cap refil <2 sec all extrem. No deformities or joint swelling noted.  Neuro: A/Ox3. No focal deficits noted.   Skin: Warm, no lesions or rashe Psych: Normal affect and behavior. Judgement and thought content appropriate.     Lab Results:  CBC    Component Value Date/Time   WBC 143.3 (HH) 06/04/2021 1244   RBC 4.68 06/04/2021 1244   HGB 14.4 06/04/2021 1244   HCT 46.5 06/04/2021 1244   PLT 142 (L) 06/04/2021 1244   MCV 99.4 06/04/2021 1244   MCH 30.8 06/04/2021 1244   MCHC 31.0 06/04/2021 1244   RDW 13.8 06/04/2021 1244   LYMPHSABS 117.5 (H) 06/04/2021 1244   MONOABS 7.2 (H) 06/04/2021 1244   EOSABS 1.4 (H) 06/04/2021 1244   BASOSABS 0.0 06/04/2021 1244    BMET    Component Value Date/Time   NA 143 06/04/2021 1244   K 5.4 (H) 06/04/2021 1244   CL 111 06/04/2021 1244   CO2 26 06/04/2021 1244   GLUCOSE 103 (H) 06/04/2021 1244   BUN 23 06/04/2021 1244   CREATININE 1.06 06/04/2021 1244   CALCIUM 9.1 06/04/2021 1244   GFRNONAA >60 06/04/2021 1244   GFRAA >60 11/23/2019 1027    BNP    Component Value Date/Time   BNP 14.0 07/05/2015 1753     Imaging:  DG  Chest 2 View  Result Date: 11/21/2021 CLINICAL DATA:  Increased right-sided chest discomfort. Cough and shortness of breath. COPD exacerbation. EXAM: CHEST - 2 VIEW COMPARISON:  Chest CT 11/07/2021. Chest and rib radiographs 10/22/2021 FINDINGS: Increased bronchial thickening from prior  exam. The heart is normal in size. The left upper lobe nodules on CT are not well seen by radiograph. No pleural effusion or pneumothorax. Stable osseous structures. IMPRESSION: 1. Increased bronchial thickening from prior exam, may represent acute bronchitis/COPD exacerbation. 2. The known left upper lobe nodule on recent CT is not well seen by radiograph. Electronically Signed   By: Keith Rake M.D.   On: 11/21/2021 17:29   CT CHEST WO CONTRAST  Result Date: 11/08/2021 CLINICAL DATA:  Chronic sinus issues. EXAM: CT CHEST WITHOUT CONTRAST TECHNIQUE: Multidetector CT imaging of the chest was performed following the standard protocol without IV contrast. RADIATION DOSE REDUCTION: This exam was performed according to the departmental dose-optimization program which includes automated exposure control, adjustment of the mA and/or kV according to patient size and/or use of iterative reconstruction technique. COMPARISON:  Chest x-ray on 09/06/2021, CT chest on 06/02/2019 FINDINGS: Cardiovascular: Heart size is normal. There is dense atherosclerotic calcification of the coronary arteries and aortic root. No pericardial effusion. There is atherosclerotic calcification of the thoracic aorta. No associated aneurysm. The noncontrast appearance of the pulmonary arteries is unremarkable. Mediastinum/Nodes: The visualized portion of the thyroid gland has a normal appearance. Esophagus is unremarkable. There is increased LEFT hilar density, consistent with adenopathy. However, discrete nodes cannot be measured given the lack of intravenous contrast. RIGHT hilar region is unremarkable. Largest precarinal lymph node is 9 millimeters in  short axis on image 54 of series 3. Bilateral axillary lymph nodes are mildly prominent but shows long-term stability since 2013. Lungs/Pleura: peripherally also in the UPPER lobe, there is irregular/nodule adjacent to cystic lucency. The solid portion of the nodule is 1.5 x 1.3 centimeters. The cystic portion is 1.2 centimeters. These densities are confluent with the LEFT hilar region. No pleural effusions. No focal consolidations. RIGHT lung is clear. Airways are patent. Upper Abdomen: No acute abnormality.  Gallbladder is present. Musculoskeletal: There is cortical destruction of the RIGHT anterior fourth rib, suspicious for metastatic disease. IMPRESSION: 1. Findings are suspicious for bronchogenic carcinoma in the LEFT UPPER lobe. There are 2 components. An irregular bandlike mass traverses the LEFT UPPER lobe. A second 1.4 centimeter solid nodule is identified adjacent to a 1.2 cystic component in the posterior LEFT UPPER lobe. These changes are contiguous with prominent LEFT hilar densities suspicious for adenopathy. 2. Per Fleischner Society Guidelines, consider a non-contrast Chest CT at 3 months, a PET/CT, or tissue sampling. These guidelines do not apply to immunocompromised patients and patients with cancer. Follow up in patients with significant comorbidities as clinically warranted. For lung cancer screening, adhere to Lung-RADS guidelines. Reference: Radiology. 2017; 284(1):228-43. 3. Suspect metastatic disease of the RIGHT anterior fourth rib. 4.  Aortic atherosclerosis.  (ICD10-I70.0) These results will be called to the ordering clinician or representative by the Radiologist Assistant, and communication documented in the PACS or [employer]. Electronically Signed   By: Nolon Nations M.D.   On: 11/08/2021 14:27   CT MAXILLOFACIAL WO CONTRAST  Result Date: 11/07/2021 CLINICAL DATA:  Pt c/o chronic sinus issues EXAM: CT MAXILLOFACIAL WITHOUT CONTRAST TECHNIQUE: Multidetector CT imaging of  the maxillofacial structures was performed. Multiplanar CT image reconstructions were also generated. RADIATION DOSE REDUCTION: This exam was performed according to the departmental dose-optimization program which includes automated exposure control, adjustment of the mA and/or kV according to patient size and/or use of iterative reconstruction technique. COMPARISON:  None Available. FINDINGS: Osseous: No fracture or mandibular dislocation. No destructive process. Orbits: Negative. No traumatic or inflammatory  finding. Sinuses: Clear. Soft tissues: Negative. Limited intracranial: No significant or unexpected finding. IMPRESSION: Unremarkable CT max face. Electronically Signed   By: Iven Finn M.D.   On: 11/07/2021 19:28   VAS US CAROTID  Result Date: 11/02/2021 Carotid Arterial Duplex Study Patient Name:  DAMARE SERANO  Date of Exam:   11/01/2021 Medical Rec #: [id number]       Accession #:    [id number] Date of Birth: Jul 02, 1950        Patient Gender: M Patient Age:   73 years Exam Location:  [facility] Procedure:      VAS US CAROTID Referring Phys: Carlyle Dolly --------------------------------------------------------------------------------  Indications:       Carotid artery disease. Risk Factors:      Hypertension, hyperlipidemia, current smoker. Comparison Study:  On 02/28/20 at [facility] carotid Duplex showed the right peak                    systolic velocity to be 94 cm/s and end diastolic veloctiy to                    be 33 cm/s. The left peak systolic velocity was 564 cm/s and                    end diastolic velocity to be 18 cm/s. Performing Technologist: Jeneen Montgomery RDMS, RVT, RDCS  Examination Guidelines: A complete evaluation includes B-mode imaging, spectral Doppler, color Doppler, and power Doppler as needed of all accessible portions of each vessel. Bilateral testing is considered an integral part of a complete examination. Limited examinations for reoccurring indications may be performed as noted.   Right Carotid Findings: +----------+--------+--------+--------+-------------------------+--------+           PSV cm/sEDV cm/sStenosisPlaque Description       Comments +----------+--------+--------+--------+-------------------------+--------+ CCA Prox  107     22                                                +----------+--------+--------+--------+-------------------------+--------+ CCA Distal80      19                                                +----------+--------+--------+--------+-------------------------+--------+ ICA Prox  67      16              calcific and heterogenous         +----------+--------+--------+--------+-------------------------+--------+ ICA Mid   72      21      1-39%                                     +----------+--------+--------+--------+-------------------------+--------+ ICA Distal76      23                                                +----------+--------+--------+--------+-------------------------+--------+ ECA       157     8                                                 +----------+--------+--------+--------+-------------------------+--------+ +----------+--------+-------+----------------+-------------------+  PSV cm/sEDV cmsDescribe        Arm Pressure (mmHG) +----------+--------+-------+----------------+-------------------+ Subclavian184     0      Multiphasic, [id number]                 +----------+--------+-------+----------------+-------------------+ +---------+--------+--+--------+-+---------+ VertebralPSV cm/s40EDV cm/s9Antegrade +---------+--------+--+--------+-+---------+  Left Carotid Findings: +----------+--------+--------+--------+-------------------------+--------+           PSV cm/sEDV cm/sStenosisPlaque Description       Comments +----------+--------+--------+--------+-------------------------+--------+ CCA Prox  99      18                                                 +----------+--------+--------+--------+-------------------------+--------+ CCA Mid   83      17      <50%    calcific and heterogenous         +----------+--------+--------+--------+-------------------------+--------+ CCA Distal79      20                                                +----------+--------+--------+--------+-------------------------+--------+ ICA Prox  93      19      1-39%   calcific and heterogenous         +----------+--------+--------+--------+-------------------------+--------+ ICA Mid   81      22                                                +----------+--------+--------+--------+-------------------------+--------+ ICA Distal75      25                                                +----------+--------+--------+--------+-------------------------+--------+ ECA       105     8                                                 +----------+--------+--------+--------+-------------------------+--------+ +----------+--------+--------+----------------+-------------------+           PSV cm/sEDV cm/sDescribe        Arm Pressure (mmHG) +----------+--------+--------+----------------+-------------------+ [id number]      0       Multiphasic, [id number]                 +----------+--------+--------+----------------+-------------------+ +---------+--------+--+--------+--+---------+ VertebralPSV cm/s52EDV cm/s13Antegrade +---------+--------+--+--------+--+---------+   Summary: Right Carotid: Velocities in the right ICA are consistent with a 1-39% stenosis. Left Carotid: Velocities in the left ICA are consistent with a 1-39% stenosis.               Non-hemodynamically significant plaque <50% noted in the CCA. Vertebrals:  Bilateral vertebral arteries demonstrate antegrade flow. Subclavians: Normal flow hemodynamics were seen in bilateral subclavian              arteries. *See table(s) above for measurements and observations.  Electronically signed by  Larae Grooms MD on 11/02/2021 at 9:35:54 PM.    Final  ECHOCARDIOGRAM COMPLETE  Result Date: 11/02/2021    ECHOCARDIOGRAM REPORT   Patient Name:   CONLEE SLITER Date of Exam: 11/01/2021 Medical Rec #:  [id number]      Height:       73.0 in Accession #:    [id number]     Weight:       174.2 lb Date of Birth:  [birth year]       BSA:          2.029 m Patient Age:    45 years       BP:           126/60 mmHg Patient Gender: M              HR:           77 bpm. Exam Location:  [facility] Procedure: 2D Echo, Cardiac Doppler, Color Doppler and Strain Analysis Indications:    I35.0 Nonrheumatic aortic (valve) stenosis  History:        Patient has prior history of Echocardiogram examinations, most                 recent 08/12/2019. CLL, Covid 2021, Hx Biscupid aortic valve and                 Aortic Valve Disease, Arrythmias:RBBB and Tachycardia; Risk                 Factors:Hypertension, Dyslipidemia and Current Smoker.  Sonographer:    Jeneen Montgomery RDMS, RVT, RDCS Referring Phys: [id number] [place]  1. Left ventricular ejection fraction, by estimation, is 60 to 65%. The left ventricle has normal function. The left ventricle has no regional wall motion abnormalities. Left ventricular diastolic parameters are consistent with Grade I diastolic dysfunction (impaired relaxation).  2. Right ventricular systolic function is normal. The right ventricular size is normal. Tricuspid regurgitation signal is inadequate for assessing PA pressure.  3. The mitral valve is normal in structure. Trivial mitral valve regurgitation. No evidence of mitral stenosis.  4. The aortic valve has an indeterminant number of cusps. There is severe calcifcation of the aortic valve. There is severe thickening of the aortic valve. Aortic valve regurgitation is moderate. Moderate aortic valve stenosis. Aortic valve mean gradient measures 24.4 mmHg. Aortic valve peak gradient measures 43.0 mmHg. Aortic valve area, by VTI measures 1.30 cm.  Comparison(s): Echocardiogram done 08/12/19 showed an EF of 55-60% with moderate AS and an AV Mean Grad of12 mmHg. FINDINGS  Left Ventricle: Left ventricular ejection fraction, by estimation, is 60 to 65%. The left ventricle has normal function. The left ventricle has no regional wall motion abnormalities. The left ventricular internal cavity size was normal in size. There is  no left ventricular hypertrophy. Left ventricular diastolic parameters are consistent with Grade I diastolic dysfunction (impaired relaxation). Normal left ventricular filling pressure. Right Ventricle: The right ventricular size is normal. Right vetricular wall thickness was not well visualized. Right ventricular systolic function is normal. Tricuspid regurgitation signal is inadequate for assessing PA pressure. Left Atrium: Left atrial size was normal in size. Right Atrium: Right atrial size was normal in size. Pericardium: There is no evidence of pericardial effusion. Mitral Valve: The mitral valve is normal in structure. Trivial mitral valve regurgitation. No evidence of mitral valve stenosis. Tricuspid Valve: The tricuspid valve is normal in structure. Tricuspid valve regurgitation is not demonstrated. No evidence of tricuspid stenosis. Aortic Valve: The aortic valve has an indeterminant number of cusps. There is severe  calcifcation of the aortic valve. There is severe thickening of the aortic valve. There is severe aortic valve annular calcification. Aortic valve regurgitation is moderate. Aortic regurgitation PHT measures 454 msec. Moderate aortic stenosis is present. Aortic valve mean gradient measures 24.4 mmHg. Aortic valve peak gradient measures 43.0 mmHg. Aortic valve area, by VTI measures 1.30 cm. Pulmonic Valve: The pulmonic valve was not well visualized. Pulmonic valve regurgitation is not visualized. No evidence of pulmonic stenosis. Aorta: The aortic root is normal in size and structure. Venous: The inferior vena cava was not  well visualized. IAS/Shunts: The interatrial septum was not well visualized.  LEFT VENTRICLE PLAX 2D LVIDd:         5.47 cm      Diastology LVIDs:         2.89 cm      LV e' medial:    7.72 cm/s LV PW:         0.90 cm      LV E/e' medial:  9.4 LV IVS:        0.69 cm      LV e' lateral:   7.07 cm/s LVOT diam:     2.00 cm      LV E/e' lateral: 10.2 LV SV:         94 LV SV Index:   46           2D Longitudinal Strain LVOT Area:     3.14 cm     2D Strain GLS (A2C):   -18.1 %                             2D Strain GLS (A3C):   -17.2 %                             2D Strain GLS (A4C):   -21.7 % LV Volumes (MOD)            2D Strain GLS Avg:     -19.0 % LV vol d, MOD A2C: 129.0 ml LV vol d, MOD A4C: 141.0 ml LV vol s, MOD A2C: 44.4 ml LV vol s, MOD A4C: 43.5 ml LV SV MOD A2C:     84.6 ml LV SV MOD A4C:     141.0 ml LV SV MOD BP:      92.6 ml RIGHT VENTRICLE RV S prime:     13.10 cm/s TAPSE (M-mode): 2.5 cm LEFT ATRIUM             Index        RIGHT ATRIUM           Index LA diam:        2.80 cm 1.38 cm/m   RA Area:     14.20 cm LA Vol (A2C):   54.0 ml 26.61 ml/m  RA Volume:   38.50 ml  18.98 ml/m LA Vol (A4C):   56.1 ml 27.65 ml/m LA Biplane Vol: 54.7 ml 26.96 ml/m  AORTIC VALVE AV Area (Vmax):    1.11 cm AV Area (Vmean):   1.13 cm AV Area (VTI):     1.30 cm AV Vmax:           327.80 cm/s AV Vmean:          233.000 cm/s AV VTI:            0.722 m AV Peak Grad:  43.0 mmHg AV Mean Grad:      24.4 mmHg LVOT Vmax:         116.00 cm/s LVOT Vmean:        83.700 cm/s LVOT VTI:          0.300 m LVOT/AV VTI ratio: 0.42 AI PHT:            454 msec AR Vena Contracta: 0.30 cm  AORTA Ao Root diam: 3.70 cm Ao Asc diam:  3.30 cm MITRAL VALVE MV Area (PHT): 2.37 cm     SHUNTS MV Decel Time: 320 msec     Systemic VTI:  0.30 m MR Peak grad: 34.1 mmHg     Systemic Diam: 2.00 cm MR Vmax:      292.00 cm/s MV E velocity: 72.20 cm/s MV A velocity: 106.00 cm/s MV E/A ratio:  0.68 Carlyle Dolly MD Electronically signed by Carlyle Dolly MD Signature Date/Time: 11/02/2021/11:46:39 AM    Final          Latest Ref Rng & Units 08/03/2019   12:18 PM  PFT Results  FVC-Pre L 4.42   FVC-Predicted Pre % 87   FVC-Post L 4.78   FVC-Predicted Post % 94   Pre FEV1/FVC % % 76   Post FEV1/FCV % % 76   FEV1-Pre L 3.34   FEV1-Predicted Pre % 88   FEV1-Post L 3.64   DLCO uncorrected ml/min/mmHg 18.28   DLCO UNC% % 63   DLVA Predicted % 71   TLC L 7.96   TLC % Predicted % 104   RV % Predicted % 134     No results found for: "NITRICOXIDE"      Assessment & Plan:   Acute exacerbation of COPD with asthma (HCC) Moderate COPD with asthmatic component. Previous PFTs with borderline bronchodilator response in FEV1 significant midflow reversibility. He has moderate obstruction today with FEV1 76%.  Possibly with AECOPD vs progression of disease vs symptoms from potential underlying neoplastic process. I suspect most of his current pain is related to the destruction of his right rib. We will check CXR to rule out superimposed infection, effusion, ptx. Recommended we start him on triple therapy - sample of Trelegy 100 and rx sent.   Patient Instructions  Start Trelegy 1 puff daily. Brush tongue and rinse mouth afterwards Continue Albuterol inhaler 2 puffs every 6 hours as needed for shortness of breath or wheezing. Notify if symptoms persist despite rescue inhaler/neb use. Continue loratidine (claritin) 1 tab daily for allergies  Continue flonase nasal spray 2 sprays each nostril daily  Lidocaine patch. Place 1 patch onto the skin daily. Remove and discard within 12 hours.   Chest x ray today   Attend PET scan tomorrow  Follow up in 2 weeks with Dr. Melvyn Novas or Katie Collyn Selk,NP. If symptoms do not improve or worsen, please contact office for sooner follow up or seek emergency care.     Nodule of upper lobe of left lung Constellation of findings are highly suspicious for malignancy with metastatic disease. He has PET scan  scheduled for tomorrow 8/24 and plans to follow up with his oncologist afterwards to discuss next steps, including possible biopsy.   Tobacco abuse Very heavy smoker. He is still smoking 1 ppd. Recent CT imaging with above findings. Strongly encouraged him to quit smoking. We spent about 3 minutes surrounding smoking cessation. He is going to let me know at his follow up if he would like pharmacological assistance   Rib pain  on right side Right rib pain; previously controlled on Norco. He has had increased pain over the past few days. See above plan.  I will send in lidocaine patches for him - rx sent to pharmacy. Recommended he continue Norco and contact his oncologist to discuss further pain management.   I spent 45 minutes of dedicated to the care of this patient on the date of this encounter to include pre-visit review of records, face-to-face time with the patient discussing conditions above, post visit ordering of testing, clinical documentation with the electronic health record, making appropriate referrals as documented, and communicating necessary findings to members of the patients care team.  Clayton Bibles, NP 11/23/2021  Pt aware and understands NP's role.

## 2021-11-21 NOTE — Patient Instructions (Addendum)
Start Trelegy 1 puff daily. Brush tongue and rinse mouth afterwards Continue Albuterol inhaler 2 puffs every 6 hours as needed for shortness of breath or wheezing. Notify if symptoms persist despite rescue inhaler/neb use. Continue loratidine (claritin) 1 tab daily for allergies  Continue flonase nasal spray 2 sprays each nostril daily  Lidocaine patch. Place 1 patch onto the skin daily. Remove and discard within 12 hours.   Chest x ray today   Attend PET scan tomorrow  Follow up in 2 weeks with Dr. Melvyn Novas or Katie Kathryn Linarez,NP. If symptoms do not improve or worsen, please contact office for sooner follow up or seek emergency care.

## 2021-11-22 ENCOUNTER — Other Ambulatory Visit: Payer: Self-pay | Admitting: Nurse Practitioner

## 2021-11-22 ENCOUNTER — Encounter (HOSPITAL_COMMUNITY)
Admission: RE | Admit: 2021-11-22 | Discharge: 2021-11-22 | Disposition: A | Discharge status: Home / Self Care | Payer: Medicare Other | Source: Ambulatory Visit | Attending: Hematology | Admitting: Hematology

## 2021-11-22 ENCOUNTER — Telehealth: Payer: Self-pay | Admitting: Nurse Practitioner

## 2021-11-22 DIAGNOSIS — C7951 Secondary malignant neoplasm of bone: Secondary | ICD-10-CM | POA: Diagnosis not present

## 2021-11-22 DIAGNOSIS — R161 Splenomegaly, not elsewhere classified: Secondary | ICD-10-CM | POA: Diagnosis not present

## 2021-11-22 DIAGNOSIS — C3490 Malignant neoplasm of unspecified part of unspecified bronchus or lung: Secondary | ICD-10-CM | POA: Diagnosis not present

## 2021-11-22 DIAGNOSIS — R590 Localized enlarged lymph nodes: Secondary | ICD-10-CM | POA: Diagnosis not present

## 2021-11-22 DIAGNOSIS — R0781 Pleurodynia: Secondary | ICD-10-CM | POA: Insufficient documentation

## 2021-11-22 DIAGNOSIS — J209 Acute bronchitis, unspecified: Secondary | ICD-10-CM

## 2021-11-22 MED ORDER — TRELEGY ELLIPTA 100-62.5-25 MCG/ACT IN AEPB: 1.0000 | INHALATION_SPRAY | Freq: Every day | RESPIRATORY_TRACT | 0 refills | Status: DC

## 2021-11-22 MED ORDER — FLUDEOXYGLUCOSE F - 18 (FDG) INJECTION
8.9400 | Freq: Once | INTRAVENOUS | Status: AC | PRN
  Administered 2021-11-22: 8.94 via INTRAVENOUS

## 2021-11-22 MED ORDER — AZITHROMYCIN 250 MG PO TABS: ORAL_TABLET | ORAL | 0 refills | Status: DC

## 2021-11-22 MED ORDER — PREDNISONE 20 MG PO TABS: 40.0000 mg | ORAL_TABLET | Freq: Every day | ORAL | 0 refills | Status: AC

## 2021-11-22 NOTE — Assessment & Plan Note (Signed)
Very heavy smoker. He is still smoking 1 ppd. Recent CT imaging with above findings. Strongly encouraged him to quit smoking. We spent about 3 minutes surrounding smoking cessation. He is going to let me know at his follow up if he would like pharmacological assistance

## 2021-11-22 NOTE — Telephone Encounter (Signed)
Please advise pharmacy would like an updated trelegy script

## 2021-11-22 NOTE — Assessment & Plan Note (Signed)
Constellation of findings are highly suspicious for malignancy with metastatic disease. He has PET scan scheduled for tomorrow 8/24 and plans to follow up with his oncologist afterwards to discuss next steps, including possible biopsy.

## 2021-11-22 NOTE — Assessment & Plan Note (Signed)
Right rib pain; previously controlled on Norco. He has had increased pain over the past few days. See above plan.  I will send in lidocaine patches for him - rx sent to pharmacy. Recommended he continue Norco and contact his oncologist to discuss further pain management.

## 2021-11-22 NOTE — Progress Notes (Signed)
Please notify patient that his chest x ray did not show any evidence of infection or fluid on/around the lungs. He does have some evidence of increased inflammation, consistent with bronchitis. I am going to send in prednisone 40 mg daily for 5 days. Take in AM with food. Z pack - take as directed. Thanks.

## 2021-11-22 NOTE — Assessment & Plan Note (Addendum)
Moderate COPD with asthmatic component. Previous PFTs with borderline bronchodilator response in FEV1 significant midflow reversibility. He has moderate obstruction today with FEV1 76%.  Possibly with AECOPD vs progression of disease vs symptoms from potential underlying neoplastic process. I suspect most of his current pain is related to the destruction of his right rib. We will check CXR to rule out superimposed infection, effusion, ptx. Recommended we start him on triple therapy - sample of Trelegy 100 and rx sent.   Patient Instructions  Start Trelegy 1 puff daily. Brush tongue and rinse mouth afterwards Continue Albuterol inhaler 2 puffs every 6 hours as needed for shortness of breath or wheezing. Notify if symptoms persist despite rescue inhaler/neb use. Continue loratidine (claritin) 1 tab daily for allergies  Continue flonase nasal spray 2 sprays each nostril daily  Lidocaine patch. Place 1 patch onto the skin daily. Remove and discard within 12 hours.   Chest x ray today   Attend PET scan tomorrow  Follow up in 2 weeks with Dr. Melvyn Novas or Katie Antionio Negron,NP. If symptoms do not improve or worsen, please contact office for sooner follow up or seek emergency care.

## 2021-11-23 ENCOUNTER — Ambulatory Visit (HOSPITAL_COMMUNITY)
Admission: RE | Admit: 2021-11-23 | Discharge: 2021-11-23 | Disposition: A | Discharge status: Home / Self Care | Payer: Medicare Other | Source: Ambulatory Visit | Attending: Hematology | Admitting: Hematology

## 2021-11-23 ENCOUNTER — Other Ambulatory Visit: Payer: Self-pay

## 2021-11-23 ENCOUNTER — Other Ambulatory Visit: Payer: Self-pay | Admitting: Nurse Practitioner

## 2021-11-23 ENCOUNTER — Encounter: Payer: Self-pay | Admitting: Nurse Practitioner

## 2021-11-23 DIAGNOSIS — C3490 Malignant neoplasm of unspecified part of unspecified bronchus or lung: Secondary | ICD-10-CM | POA: Diagnosis not present

## 2021-11-23 DIAGNOSIS — J411 Mucopurulent chronic bronchitis: Secondary | ICD-10-CM

## 2021-11-23 MED ORDER — TRELEGY ELLIPTA 100-62.5-25 MCG/ACT IN AEPB: 1.0000 | INHALATION_SPRAY | Freq: Every day | RESPIRATORY_TRACT | 3 refills | Status: DC

## 2021-11-23 MED ORDER — GADOBUTROL 1 MMOL/ML IV SOLN
7.0000 mL | Freq: Once | INTRAVENOUS | Status: AC | PRN
  Administered 2021-11-23: 7 mL via INTRAVENOUS

## 2021-11-23 NOTE — Progress Notes (Unsigned)
Alfred Keen, MD  Tamala Bari for CT of bone mets, either right anterior 4th rib lesion or posterior right iliac leison   TS

## 2021-11-23 NOTE — Telephone Encounter (Signed)
Updated quantity per pharmacy's request. Thanks

## 2021-11-23 NOTE — Progress Notes (Signed)
Order placed for CT biopsy per Dr. Delton Coombes

## 2021-11-28 ENCOUNTER — Other Ambulatory Visit: Payer: Self-pay

## 2021-11-28 ENCOUNTER — Encounter: Payer: Self-pay | Admitting: Lab

## 2021-11-28 DIAGNOSIS — R0781 Pleurodynia: Secondary | ICD-10-CM

## 2021-11-28 MED ORDER — LIDOCAINE 5 % EX PTCH: 1.0000 | MEDICATED_PATCH | CUTANEOUS | 2 refills | Status: AC

## 2021-11-28 NOTE — Telephone Encounter (Signed)
Patient's wife called reporting that the Oxycodone 10mg  is not controlling the pain for the full 4hrs between doses. Dr. Delton Coombes recommended lidocaine 5% patches every 12hrs. Wife made aware and verbalized understanding.

## 2021-11-28 NOTE — Progress Notes (Unsigned)
Referral sent to [facility].  Records faxed on 8/30

## 2021-11-29 ENCOUNTER — Other Ambulatory Visit (HOSPITAL_COMMUNITY): Payer: Self-pay

## 2021-11-29 ENCOUNTER — Telehealth: Payer: Self-pay

## 2021-11-29 NOTE — Telephone Encounter (Signed)
Patient Advocate Encounter   Received notification from [place] that prior authorization is required for Lidocaine 5% patches  Submitted: 11-29-2021 Key [id number]

## 2021-11-30 ENCOUNTER — Other Ambulatory Visit (HOSPITAL_COMMUNITY): Payer: Self-pay

## 2021-11-30 NOTE — Telephone Encounter (Signed)
Patient Advocate Encounter  Received a fax regarding Prior Authorization for Lidocaine 5% patches.   Authorization has been DENIED due to invalid diagnosis code.  Denial letter attached to patient chart.

## 2021-12-04 ENCOUNTER — Encounter: Payer: Self-pay | Admitting: Internal Medicine

## 2021-12-04 ENCOUNTER — Ambulatory Visit (INDEPENDENT_AMBULATORY_CARE_PROVIDER_SITE_OTHER): Payer: Medicare Other | Admitting: Internal Medicine

## 2021-12-04 ENCOUNTER — Ambulatory Visit (HOSPITAL_COMMUNITY): Payer: Medicare Other

## 2021-12-04 ENCOUNTER — Other Ambulatory Visit: Payer: Self-pay

## 2021-12-04 ENCOUNTER — Other Ambulatory Visit (HOSPITAL_COMMUNITY): Payer: Self-pay

## 2021-12-04 DIAGNOSIS — R918 Other nonspecific abnormal finding of lung field: Secondary | ICD-10-CM

## 2021-12-04 DIAGNOSIS — I6529 Occlusion and stenosis of unspecified carotid artery: Secondary | ICD-10-CM | POA: Diagnosis not present

## 2021-12-04 DIAGNOSIS — J449 Chronic obstructive pulmonary disease, unspecified: Secondary | ICD-10-CM | POA: Diagnosis not present

## 2021-12-04 MED ORDER — OXYCODONE HCL 10 MG PO TABS
10.0000 mg | ORAL_TABLET | ORAL | 0 refills | Status: DC | PRN
Start: 2021-12-04 — End: 2021-12-20

## 2021-12-04 MED ORDER — ALBUTEROL SULFATE HFA 108 (90 BASE) MCG/ACT IN AERS: INHALATION_SPRAY | RESPIRATORY_TRACT | 11 refills | Status: AC

## 2021-12-04 NOTE — Assessment & Plan Note (Signed)
Quit smoking march 2021 with covid 19 pna/resp failure resolved - PFT's  08/03/2019  FEV1 3.64 (96 % ) ratio 0.76  p 9 % improvement from saba p nothing prior to study with DLCO  18.28 (63%) corrects to 2.88 (71%)  for alv volume and FV curve no significant curvature on f/v loop   -  08/05/2019   Walked RA   X 500 ft  @ slow pace  stopped due to end of study, sats 96% no sob or cp   He may have a component of AB / cough though no wheeze on exam or significant airflow obst so this is not copd and trelelgy may just be contributing to cough/ cp from mets to ribs so rec  D/c trelegy and just use prn saba  Re SABA :  I spent extra time with pt today reviewing appropriate use of albuterol for prn use on exertion with the following points: 1) saba is for relief of sob that does not improve by walking a slower pace or resting but rather if the pt does not improve after trying this first. 2) If the pt is convinced, as many are, that saba helps recover from activity faster then it's easy to tell if this is the case by re-challenging : ie stop, take the inhaler, then p 5 minutes try the exact same activity (intensity of workload) that just caused the symptoms and see if they are substantially diminished or not after saba 3) if there is an activity that reproducibly causes the symptoms, try the saba 15 min before the activity on alternate days   If in fact the saba really does help, then fine to continue to use it prn but advised may need to look closer at the maintenance regimen being used to achieve better control of airways disease with exertion.

## 2021-12-04 NOTE — Assessment & Plan Note (Signed)
C/w Stage 4 Lung ca with boney involvement  -  Of immediate concern is cough inducted R rib pain in area of mets/ pathologic fx's a risk so rec  Stop trelegy as risks making cough worse Continue oxycodone 10 mg q 4 h refills per onc Lidocaine patch ASAP  bx / RT planned per [facility] for next week  F/u pulmonary clinic in [place] 6 weeks, call sooner prn          Each maintenance medication was reviewed in detail including emphasizing most importantly the difference between maintenance and prns and under what circumstances the prns are to be triggered using an action plan format where appropriate.  Total time for H and P, chart review, counseling, reviewing hfa device(s) and generating customized AVS unique to this office visit / same day charting = > 40 min for pt not seen in over 2 years

## 2021-12-04 NOTE — Progress Notes (Signed)
Alfred Brown, male    DOB: [birth year],     MRN: [id number]   Brief patient profile:  4 yowm quit smoking June 02 2019  on 2006  On disability for CLL with dx of COPD at  [facility] around 2010 and once or twice weekly needing albuterol and seemed to help cough then covid dx 05/09/19 rx antibody did seem to help  then   Admit date: 06/02/2019 Discharge date: 06/04/2019   Admitted From:  Home  Disposition:  Home    Recommendations for Outpatient Follow-up:  Follow up with PCP in 1 weeks Please obtain BMP/CBC in one week Please follow up on the following pending results:   Discharge Condition: STABLE   CODE STATUS: FULL     Brief Hospitalization Summary: Please see all hospital notes, images, labs for full details of the hospitalization.   ADMISSION HPI:  Dainel Brown  is a 71 y.o. male, with history of CLL, GERD, hyperlipidemia, COPD who was diagnosed with COVID-19 infection 3 weeks ago, and received outpatient IV monoclonal antibody at [facility].  Patient today went to his primary care physician for fever and shortness of breath.  Chest x-ray was consistent with pneumonia.  Patient was sent to ED for further evaluation.  In the ED CT chest showed multifocal pneumonia, worrisome for viral pneumonia. Patient has history of CLL, WBC count is 86,000.  He is followed by hematology/oncology at [facility] as outpatient. He denies nausea vomiting or diarrhea. Patient said that he started having fever last week Also complains of shortness of breath. In the ED patient was put on 2 L/min of oxygen.  He denies abdominal pain, dysuria    Brief Admission Hx: 71 y.o. male, with history of CLL, GERD, hyperlipidemia, COPD who was diagnosed with COVID-19 infection diagnosed 05/09/19 and received outpatient IV monoclonal antibody at [facility] had done well after treatments.  In last couple of days presents with increasing SOB, fever, found to have pneumonia on CXR and new oxygen requirement.     MDM/Assessment & Plan:    Acute   respiratory failure with hypoxia - Pt presented with a new oxygen requirement.  He has had increasing SOB prior to admission.  His symptoms are consistent with COPD exacerbation.  He responded well to treatments and at d/c  off oxygen, ambulating, feeling better.   History of Covid 57 - He was diagnosed on 05/09/19 and received monoclonal antibody therapy and felt much better until recently.  It has been nearly 30 days since he was diagnosed and does not need Covid isolation.   COPD exacerbation - He is being treated with steroids, duonebs and doxycycline.  Atypical pneumonia- CXR and CT findings are likely sequela of post Covid 19 infection.  He is on doxycycline to cover atypicals although his procalcitonin is low which argues against bacterial infection.  CLL with leukocytosis - wife reports he is followed by heme/onc and has WBC counts up to 140, he had a bump in WBC after steroids and we are following.  Hematology consult requested.  Hypogammaglobulinemia - from underlying CLL per hem/onc noted.  They did not recommend IVIG when he was seen on 05/25/19.  I asked for inpatient hematology consult and he was seen by Dr. Delton Coombes and given 1 dose of IVIG.  Pt tolerated it well.  Follow up with Hem/onc clinic in 2 weeks.  Hyperlipidemia - resumed on home meds.  Anxiety / depression - resumed home meds.  Generalized weakness - resolved now, he  is ambulating independently in room.    DVT prophylaxis: lovenox  Code Status: Full  Family Communication: wife updated by telephone  Disposition Plan: home  Consults: Dr. Delton Coombes Hematology/Oncology   Discharge Diagnoses:  Principal Problem:   Acute and chronic respiratory failure with hypoxia ([place]) Active Problems:   GERD (gastroesophageal reflux disease)   Hyperlipidemia   Chronic lymphocytic leukemia ([place])   Pneumonia due to COVID-19 virus   COPD exacerbation ([place])   History of COVID-19 dx 05-09-19      History of Present Illness   08/05/2019  Pulmonary/ 1st office eval/Yumiko Alkins re GOLD 0 copd  Chief Complaint  Patient presents with   Consult    hx of covid and pna, hospitalized with pna.  sob worse since pna.  sob with exertion.   Dyspnea:  Maybe 100 ft ,  Now sob to mb flat and "pushes thru it but can really feel it fatigue and sob same time, not checking sats while walking   Cough: not a problem  Sleep: flat / one pillow SABA use: never prechallenges rec Don't do any more exertion than you have been at home until you have until you are cleared by your heart doctor - then follow his guidelines for more exercise but you don't have significant COPD so you should do fine as you continue to recover from COVID 19 pneumonia. Try albuterol 15 min before an activity that you know would make you short of breath and see if it makes any difference -  if makes none then don't take it after activity unless you can't catch your breath. Work on inhaler technique:    Make sure you check your oxygen saturations at highest level of activity to be sure it stays over 90%  Let me know if the the trend is down over time as it should gradually improve  Strongly recommend the prevar 13 and the covid vaccination  - discuss with your hematologist    09/17/2019  f/u ov/Amarisa Wilinski re: GOLD 0 copd/ AS  Post covid pna ild/ not vaccinated yet  Chief Complaint  Patient presents with   Follow-up    Breathing is about the same, maybe some better since the last visit. He has occ cough with white sputum. He rarely uses his albuterol inhaler.    Dyspnea:  mb and back getting easier with or without inhaler sats upper 90s  Cough: first thing in am, white x two coughs  Sleeping: ok flat  SABA use: none  - does not help ex performance if use prior  02: none  Rec Make sure you check your oxygen saturations about once a week at highest level of activity to be sure it stays over 90% The key is to stop smoking completely before smoking completely stops you! Pulmonary  follow up is as needed     NP recs  11/21/21  Start Trelegy 1 puff daily. Brush tongue and rinse mouth afterwards Continue Albuterol inhaler 2 puffs every 6 hours as needed for shortness of breath or wheezing. Notify if symptoms persist despite rescue inhaler/neb use. Continue loratidine (claritin) 1 tab daily for allergies  Continue flonase nasal spray 2 sprays each nostril daily Lidocaine patch. Place 1 patch onto the skin daily. Remove and discard within 12 hours.      12/04/2021  f/u ov/Icie Kuznicki re: GOLD 0/AS  maint on trelegy   Chief Complaint  Patient presents with   Follow-up    Pt states no new issues since LOV.  Dyspnea:  really not limited by doe but by pain in area of mets to R 4th rib refractory to oxy 10 mg  Cough: dry  Sleeping: on back 10 degrees  SABA use: not using p trelegy 02: none  Covid status:  vax x none / infected   No obvious day to day or daytime variability or assoc excess/ purulent sputum or mucus plugs or hemoptysis  or chest tightness, subjective wheeze or overt sinus or hb symptoms.   Sleeping  without nocturnal  or early am exacerbation  of respiratory  c/o's or need for noct saba. Also denies any obvious fluctuation of symptoms with weather or environmental changes or other aggravating or alleviating factors except as outlined above   No unusual exposure hx or h/o childhood pna/ asthma or knowledge of premature birth.  Current Allergies, Complete Past Medical History, Past Surgical History, Family History, and Social History were reviewed in [employer] record.  ROS  The following are not active complaints unless bolded Hoarseness, sore throat, dysphagia, dental problems, itching, sneezing,  nasal congestion or discharge of excess mucus or purulent secretions, ear ache,   fever, chills, sweats, unintended wt loss or wt gain, classically exertional cp,  orthopnea pnd or arm/hand swelling  or leg swelling, presyncope, palpitations,  abdominal pain, anorexia, nausea, vomiting, diarrhea  or change in bowel habits or change in bladder habits, change in stools or change in urine, dysuria, hematuria,  rash, arthralgias, visual complaints, headache, numbness, weakness or ataxia or problems with walking or coordination,  change in mood or  memory.        Current Meds  Medication Sig   albuterol (PROVENTIL HFA;VENTOLIN HFA) 108 (90 BASE) MCG/ACT inhaler Inhale 2 puffs into the lungs every 6 (six) hours as needed for wheezing or shortness of breath.    ALPRAZolam (XANAX) 1 MG tablet Take 1 mg by mouth 3 (three) times daily as needed for anxiety.    aspirin 325 MG tablet Take 325 mg by mouth daily.   buPROPion (WELLBUTRIN SR) 150 MG 12 hr tablet Take 150 mg by mouth daily.   calcium carbonate (OS-CAL) 600 MG TABS Take 600 mg by mouth daily.   celecoxib (CELEBREX) 100 MG capsule Take 100 mg by mouth 2 (two) times daily.   clotrimazole-betamethasone (LOTRISONE) cream Apply 1 application topically daily as needed. Affected area   diltiazem (CARDIZEM) 30 MG tablet Take 30 mg by mouth as needed.   fish oil-omega-3 fatty acids 1000 MG capsule Take 1 g by mouth daily.   fluticasone (FLONASE) 50 MCG/ACT nasal spray Place 2 sprays into the nose daily.   Fluticasone-Umeclidin-Vilant (TRELEGY ELLIPTA) 100-62.5-25 MCG/ACT AEPB Inhale 1 puff into the lungs daily.   Loratadine 10 MG CAPS Take 10 mg by mouth daily.   lovastatin (MEVACOR) 20 MG tablet Take 20 mg by mouth daily.   Metoprolol Tartrate 37.5 MG TABS Take 37.5 mg by mouth 2 (two) times daily.   Multiple Vitamin (MULTIVITAMIN) tablet Take 1 tablet by mouth daily.   niacin (NIASPAN) 500 MG CR tablet Take 500 mg by mouth daily.   Oxycodone HCl 10 MG TABS Take 1 tablet (10 mg total) by mouth every 4 (four) hours as needed.   valACYclovir (VALTREX) 1000 MG tablet Take 4,000 mg by mouth as needed.   valACYclovir (VALTREX) 500 MG tablet Take 500 mg by mouth daily.                   Past  Medical History:  Diagnosis Date   Aortic valve disorder    Mild insufficiency   Chronic lymphocytic leukemia ([place])    01/2012: WBC-90.2, H&H-15.1/45.3, platelets-185 03/31/12: Verified with Dr. Tressie Stalker that no precautions or modification of medical regime are required prior to orthopaedic surgery.    CLL (chronic lymphocytic leukemia) (HCC)    CMV (cytomegalovirus) (HCC)    COPD (chronic obstructive pulmonary disease) (HCC)    Depression with anxiety    DJD (degenerative joint disease)    GERD (gastroesophageal reflux disease)    Hyperlipidemia    Lipoma    left chest wall   Palpitations 2004   PVCs; borderline stress nuclear in 2004, negative in 2007; Echo 2007; AV-sclerotic, very mild AI   Pneumonia    Right bundle branch block    + left posterior fascicular block   Tobacco abuse    80 pack years       Objective:      12/04/2021          164   09/17/19 176 lb (79.8 kg)  08/05/19 176 lb (79.8 kg)  07/12/19 172 lb 6.4 oz (78.2 kg)    Vital signs reviewed  12/04/2021  - Note at rest 02 sats  98% on RA   General appearance:    amb elderly frail wm clutching R and chest wall   HEENT : Oropharynx  clear  Nasal turbinates nl    NECK :  without  apparent JVD/ palpable Nodes/TM    LUNGS: no acc muscle use,  Min barrel  contour chest wall with bilateral  slightly decreased bs s audible wheeze and  without cough on insp or exp maneuvers and min  Hyperresonant  to  percussion bilaterally    CV:  RRR  no s3 or murmur or increase in P2, and no edema   ABD:  soft and nontender with pos end  insp Hoover's  in the supine position.  No bruits or organomegaly appreciated   MS:  Nl gait/ ext warm without deformities Or obvious joint restrictions  calf tenderness, cyanosis or clubbing     SKIN: warm and dry without lesions / patch on R chest wall (4% lidocaine otc, helping some)    NEURO:  alert, approp, nl sensorium with  no motor or cerebellar deficits apparent.            I personally reviewed images and agree with radiology impression as follows:  PET scan 11/22/21 1. Large central left upper lobe/left suprahilar lung mass is hypermetabolic and consistent with known non-small cell lung cancer. Associated hilar and mediastinal lymphadenopathy. 2. No findings for abdominal/pelvic metastatic disease or metastatic pulmonary nodules. 3. Metastatic bone disease with aggressive destructive bone lesions     Assessment

## 2021-12-04 NOTE — Patient Instructions (Signed)
Stop trelegy today and see if your breathing is any worse or increase need for albuterol   Only use your albuterol as a rescue medication to be used if you can't catch your breath by resting or doing a relaxed purse lip breathing pattern.  - The less you use it, the better it will work when you need it. - Ok to use up to 2 puffs  every 4 hours if you must but call for immediate appointment if use goes up over your usual need - Don't leave home without it !!  (think of it like the spare tire for your car)   Ok to try albuterol 15 min before an activity (on alternating days)  that you know would usually make you short of breath and see if it makes any difference and if makes none then don't take albuterol after activity unless you can't catch your breath as this means it's the resting that helps, not the albuterol.  I will try to help you get the lidocaine improved   Please schedule a follow up office visit in 6 weeks, call sooner if needed [place] office.

## 2021-12-04 NOTE — Telephone Encounter (Signed)
[facility] and provided updated clinical information for an expedited redetermination with Pharmacist Abigail Butts.  Prior Auth for patients medication Lidocaine 5% Patch APPROVED by [place] [employer] from 05/02/21 to 12/04/22.   PA# [id number]  Awaiting approval letter.  Phone: [phone]

## 2021-12-06 ENCOUNTER — Other Ambulatory Visit: Payer: Self-pay | Admitting: Hematology

## 2021-12-06 ENCOUNTER — Encounter (HOSPITAL_COMMUNITY): Payer: Self-pay

## 2021-12-06 ENCOUNTER — Ambulatory Visit (HOSPITAL_COMMUNITY)
Admission: RE | Admit: 2021-12-06 | Discharge: 2021-12-06 | Disposition: A | Discharge status: Home / Self Care | Payer: Medicare Other | Source: Ambulatory Visit | Attending: Hematology | Admitting: Hematology

## 2021-12-06 DIAGNOSIS — R590 Localized enlarged lymph nodes: Secondary | ICD-10-CM | POA: Insufficient documentation

## 2021-12-06 DIAGNOSIS — Z7982 Long term (current) use of aspirin: Secondary | ICD-10-CM | POA: Diagnosis not present

## 2021-12-06 DIAGNOSIS — C3492 Malignant neoplasm of unspecified part of left bronchus or lung: Secondary | ICD-10-CM | POA: Diagnosis not present

## 2021-12-06 DIAGNOSIS — C9110 Chronic lymphocytic leukemia of B-cell type not having achieved remission: Secondary | ICD-10-CM | POA: Insufficient documentation

## 2021-12-06 DIAGNOSIS — Z85118 Personal history of other malignant neoplasm of bronchus and lung: Secondary | ICD-10-CM | POA: Diagnosis not present

## 2021-12-06 DIAGNOSIS — R5383 Other fatigue: Secondary | ICD-10-CM | POA: Diagnosis not present

## 2021-12-06 DIAGNOSIS — R0602 Shortness of breath: Secondary | ICD-10-CM | POA: Diagnosis not present

## 2021-12-06 DIAGNOSIS — C7951 Secondary malignant neoplasm of bone: Secondary | ICD-10-CM | POA: Insufficient documentation

## 2021-12-06 DIAGNOSIS — C801 Malignant (primary) neoplasm, unspecified: Secondary | ICD-10-CM | POA: Diagnosis not present

## 2021-12-06 DIAGNOSIS — R0789 Other chest pain: Secondary | ICD-10-CM | POA: Diagnosis not present

## 2021-12-06 DIAGNOSIS — C3490 Malignant neoplasm of unspecified part of unspecified bronchus or lung: Secondary | ICD-10-CM

## 2021-12-06 DIAGNOSIS — Z79899 Other long term (current) drug therapy: Secondary | ICD-10-CM | POA: Diagnosis not present

## 2021-12-06 DIAGNOSIS — J449 Chronic obstructive pulmonary disease, unspecified: Secondary | ICD-10-CM | POA: Diagnosis not present

## 2021-12-06 DIAGNOSIS — R059 Cough, unspecified: Secondary | ICD-10-CM | POA: Insufficient documentation

## 2021-12-06 DIAGNOSIS — F17210 Nicotine dependence, cigarettes, uncomplicated: Secondary | ICD-10-CM | POA: Insufficient documentation

## 2021-12-06 DIAGNOSIS — M898X8 Other specified disorders of bone, other site: Secondary | ICD-10-CM | POA: Diagnosis not present

## 2021-12-06 LAB — CBC
HCT: 42.9 % (ref 39.0–52.0)
Hemoglobin: 12.8 g/dL — ABNORMAL LOW (ref 13.0–17.0)
MCH: 28 pg (ref 26.0–34.0)
MCHC: 29.8 g/dL — ABNORMAL LOW (ref 30.0–36.0)
MCV: 93.9 fL (ref 80.0–100.0)
Platelets: 213 10*3/uL (ref 150–400)
RBC: 4.57 MIL/uL (ref 4.22–5.81)
RDW: 14.6 % (ref 11.5–15.5)
WBC: 186.8 10*3/uL (ref 4.0–10.5)
nRBC: 0 % (ref 0.0–0.2)

## 2021-12-06 LAB — PROTIME-INR
INR: 1.1 (ref 0.8–1.2)
Prothrombin Time: 14.2 seconds (ref 11.4–15.2)

## 2021-12-06 MED ORDER — FENTANYL CITRATE (PF) 100 MCG/2ML IJ SOLN
INTRAMUSCULAR | Status: AC
  Filled 2021-12-06: qty 4

## 2021-12-06 MED ORDER — FENTANYL CITRATE (PF) 100 MCG/2ML IJ SOLN
INTRAMUSCULAR | Status: AC | PRN
  Administered 2021-12-06: 50 ug via INTRAVENOUS

## 2021-12-06 MED ORDER — MIDAZOLAM HCL 2 MG/2ML IJ SOLN
INTRAMUSCULAR | Status: AC | PRN
  Administered 2021-12-06: 1 mg via INTRAVENOUS

## 2021-12-06 MED ORDER — LIDOCAINE HCL 1 % IJ SOLN: 10.0000 mL | Freq: Once | INTRAMUSCULAR | Status: DC

## 2021-12-06 MED ORDER — MIDAZOLAM HCL 2 MG/2ML IJ SOLN
INTRAMUSCULAR | Status: AC
  Filled 2021-12-06: qty 6

## 2021-12-06 NOTE — H&P (Signed)
Chief Complaint: Patient was seen in consultation today for bone lesion biopsy   Referring Physician(s): Park  Supervising Physician: Corrie Mckusick  Patient Status: [facility] - Out-pt  History of Present Illness: Alfred Brown is a 71 y.o. male with a medical history significant for depression/anxiety, COPD, CLL (diagnosed 2010, on surveillance) and newly identified left upper lobe lung masses. In May 2023 he developed a worsening cough with right chest wall pain. Imaging obtained showed findings consistent with metastatic lung cancer.   NM PET 11/22/21 SKELETON: Osseous metastatic disease is demonstrated with a large lytic destructive bone lesion involving the right iliac bone with SUV max of 16.09. There are also destructive bone lesions involving the right second and fourth ribs. The fourth anterior rib lesion is larger and quite destructive. Associated surrounding soft tissue component and SUV max is 14.61. Small lesion involving the right posterior aspect of T5. No canal involvement. SUV max is 6.61. IMPRESSION: 1. Large central left upper lobe/left suprahilar lung mass is hypermetabolic and consistent with known non-small cell lung cancer. Associated hilar and mediastinal lymphadenopathy. 2. No findings for abdominal/pelvic metastatic disease or metastatic pulmonary nodules. 3. Metastatic bone disease with aggressive destructive bone lesions as detailed above.   Interventional Radiology has been asked to evaluate this patient for an image-guided bone lesion biopsy. Imaging reviewed and procedure approved by Dr. Annamaria Boots.   Past Medical History:  Diagnosis Date   Aortic valve disorder    Mild insufficiency   Chronic lymphocytic leukemia ([place])    01/2012: WBC-90.2, H&H-15.1/45.3, platelets-185 03/31/12: Verified with Dr. Tressie Stalker that no precautions or modification of medical regime are required prior to orthopaedic surgery.    CLL (chronic lymphocytic  leukemia) (HCC)    CMV (cytomegalovirus) (HCC)    COPD (chronic obstructive pulmonary disease) (HCC)    Depression with anxiety    DJD (degenerative joint disease)    GERD (gastroesophageal reflux disease)    Hyperlipidemia    Hypogammaglobulinemia ([place]) 09/23/2019   Lipoma    left chest wall   Palpitations 2004   PVCs; borderline stress nuclear in 2004, negative in 2007; Echo 2007; AV-sclerotic, very mild AI   Pneumonia    Right bundle branch block    + left posterior fascicular block   Tobacco abuse    80 pack years    Past Surgical History:  Procedure Laterality Date   COLONOSCOPY  01/31/2012   Negative screening study   GANGLION CYST EXCISION  04/02/1995   left wrist   LIPOMA EXCISION Left 10/11/2021   chest wall   ROTATOR CUFF REPAIR  04/02/1995   right   SHOULDER ARTHROSCOPY WITH SUBACROMIAL DECOMPRESSION  04/09/2012   Procedure: SHOULDER ARTHROSCOPY WITH SUBACROMIAL DECOMPRESSION;  Surgeon: Ninetta Lights, MD;  Location: [place];  Service: Orthopedics;  Laterality: Left;  LEFT SHOULDER ARTHROSCOPY, SUBACROMIAL DECOMPRESSION, PARTIAL ACROMIOPLASTY WITH CORACOACROMIAL RELEASE, DISTAL CLAVICULECTOMY WITH ROTATOR CUFF REPAIR, DEBRIDEMENT OF LABRUM    Allergies: Patient has no known allergies.  Medications: Prior to Admission medications   Medication Sig Start Date End Date Taking? Authorizing Provider  ALPRAZolam Duanne Moron) 1 MG tablet Take 1 mg by mouth 3 (three) times daily as needed for anxiety.    Yes [provider]  buPROPion (WELLBUTRIN SR) 150 MG 12 hr tablet Take 150 mg by mouth daily.   Yes [provider]  calcium carbonate (OS-CAL) 600 MG TABS Take 600 mg by mouth daily.   Yes [provider]  celecoxib (CELEBREX) 100 MG  capsule Take 100 mg by mouth 2 (two) times daily.   Yes [provider]  clotrimazole-betamethasone (LOTRISONE) cream Apply 1 application topically daily as needed. Affected area 06/19/15  Yes  [provider]  diltiazem (CARDIZEM) 30 MG tablet Take 30 mg by mouth as needed.   Yes [provider]  fish oil-omega-3 fatty acids 1000 MG capsule Take 1 g by mouth daily.   Yes [provider]  fluticasone (FLONASE) 50 MCG/ACT nasal spray Place 2 sprays into the nose daily.   Yes [provider]  lidocaine (LIDODERM) 5 % Place 1 patch onto the skin daily. Remove and Discard patch within 12 hours. 11/28/21  Yes Derek Jack, MD  Loratadine 10 MG CAPS Take 10 mg by mouth daily.   Yes [provider]  lovastatin (MEVACOR) 20 MG tablet Take 20 mg by mouth daily. 09/29/14  Yes [provider]  Metoprolol Tartrate 37.5 MG TABS Take 37.5 mg by mouth 2 (two) times daily. 11/07/21  Yes BranchAlphonse Guild, MD  Multiple Vitamin (MULTIVITAMIN) tablet Take 1 tablet by mouth daily.   Yes [provider]  niacin (NIASPAN) 500 MG CR tablet Take 500 mg by mouth daily. 09/29/14  Yes [provider]  Oxycodone HCl 10 MG TABS Take 1 tablet (10 mg total) by mouth every 4 (four) hours as needed. 12/04/21  Yes Derek Jack, MD  valACYclovir (VALTREX) 1000 MG tablet Take 4,000 mg by mouth as needed. 04/09/21  Yes [provider]  valACYclovir (VALTREX) 500 MG tablet Take 500 mg by mouth daily. 03/29/19  Yes [provider]  albuterol (VENTOLIN HFA) 108 (90 Base) MCG/ACT inhaler 2 puffs every 4 hours as needed if you can't catch your breath 12/04/21   Tanda Rockers, MD  aspirin 325 MG tablet Take 325 mg by mouth daily.    [provider]     Family History  Problem Relation Age of Onset   Stroke Mother    Heart attack Father    Cancer Sister        colon   Colon cancer Sister    Cancer Brother        2 brothers died with lung cancer    Social History   Socioeconomic History   Marital status: Married    Spouse name: Not on file   Number of children: Not on file   Years of education: Not on file    Highest education level: Not on file  Occupational History   Not on file  Tobacco Use   Smoking status: Every Day    Packs/day: 1.00    Years: 45.00    Total pack years: 45.00    Types: Cigarettes    Start date: 12/03/1963   Smokeless tobacco: Never   Tobacco comments:    Pt states he smokes almost a whole pack daily. 12/04/21  Vaping Use   Vaping Use: Never used  Substance and Sexual Activity   Alcohol use: No    Alcohol/week: 0.0 standard drinks of alcohol   Drug use: No   Sexual activity: Not on file  Other Topics Concern   Not on file  Social History Narrative   Not on file   Social Determinants of Health   Financial Resource Strain: Not on file  Food Insecurity: Not on file  Transportation Needs: Not on file  Physical Activity: Not on file  Stress: Not on file  Social Connections: Not on file    Review of  Systems: A 12 point ROS discussed and pertinent positives are indicated in the HPI above.  All other systems are negative.  Review of Systems  Constitutional:  Positive for fatigue.  Respiratory:  Negative for cough and shortness of breath.   Cardiovascular:  Negative for chest pain and leg swelling.  Gastrointestinal:  Negative for abdominal pain, diarrhea, nausea and vomiting.  Musculoskeletal:  Positive for back pain.  Neurological:  Negative for dizziness and headaches.    Vital Signs: BP 127/63   Pulse 72   Temp 98.5 F (36.9 C) (Oral)   Resp 15   Ht 6\' 1"  (1.854 m)   Wt 167 lb (75.8 kg)   SpO2 97%   BMI 22.03 kg/m   Physical Exam Constitutional:      General: He is not in acute distress. HENT:     Mouth/Throat:     Mouth: Mucous membranes are moist.     Pharynx: Oropharynx is clear.  Cardiovascular:     Rate and Rhythm: Normal rate.     Pulses: Normal pulses.  Pulmonary:     Effort: Pulmonary effort is normal.  Musculoskeletal:        General: Tenderness present.     Comments: Right chest wall pain  Skin:    General: Skin is warm and  dry.  Neurological:     Mental Status: He is alert and oriented to person, place, and time.     Imaging: NM PET Image Initial (PI) Skull Base To Thigh  Result Date: 11/24/2021 CLINICAL DATA:  Initial treatment strategy for non-small cell lung cancer. History of CLL EXAM: NUCLEAR MEDICINE PET SKULL BASE TO THIGH TECHNIQUE: 8.94 mCi F-18 FDG was injected intravenously. Full-ring PET imaging was performed from the skull base to thigh after the radiotracer. CT data was obtained and used for attenuation correction and anatomic localization. Fasting blood glucose: 87 mg/dl COMPARISON:  Chest CT 11/07/2021 FINDINGS: Mediastinal blood pool activity: SUV max 2.14 Liver activity: SUV max NA NECK: No hypermetabolic lymph nodes in the neck. Incidental CT findings: Bilateral carotid artery calcifications. CHEST: The left upper lobe/left hilar mass is hypermetabolic with SUV max of 79.02. Adjacent hypermetabolic left hilar node has an SUV max of 7.60. Hypermetabolic pre-vascular and AP window nodes are also noted. Larger subcarinal lymph node measures 22 mm and has an SUV max of 14.88. The curvilinear nodular density in the left upper lobe adjacent to the major fissure has an SUV max of 3.08. The more linear left upper lobe density has an SUV max of 3.59. Indeterminate findings but possible endobronchial spread of tumor. No definite metastatic pulmonary nodules. No enlarged or hypermetabolic supraclavicular or axillary nodes. Incidental CT findings: Stable age advanced aortic and coronary artery calcifications. Stable underlying emphysematous changes and pulmonary scarring. ABDOMEN/PELVIS: No abnormal hypermetabolic activity within the liver, pancreas, adrenal glands, or spleen. No hypermetabolic lymph nodes in the abdomen or pelvis. Incidental CT findings: Mild splenomegaly noted. The age advanced atherosclerotic calcification involving the aorta and iliac arteries but no aneurysm. Sigmoid colon diverticulosis.  SKELETON: Osseous metastatic disease is demonstrated with a large lytic destructive bone lesion involving the right iliac bone with SUV max of 16.09. There are also destructive bone lesions involving the right second and fourth ribs. The fourth anterior rib lesion is larger and quite destructive. Associated surrounding soft tissue component and SUV max is 14.61. Small lesion involving the right posterior aspect of T5. No canal involvement. SUV max is 6.61. Incidental CT findings: None. IMPRESSION: 1.  Large central left upper lobe/left suprahilar lung mass is hypermetabolic and consistent with known non-small cell lung cancer. Associated hilar and mediastinal lymphadenopathy. 2. No findings for abdominal/pelvic metastatic disease or metastatic pulmonary nodules. 3. Metastatic bone disease with aggressive destructive bone lesions as detailed above. Electronically Signed   By: Marijo Sanes M.D.   On: 11/24/2021 09:34   MR Brain W Wo Contrast  Result Date: 11/23/2021 CLINICAL DATA:  Non-small-cell lung cancer staging EXAM: MRI HEAD WITHOUT AND WITH CONTRAST TECHNIQUE: Multiplanar, multiecho pulse sequences of the brain and surrounding structures were obtained without and with intravenous contrast. CONTRAST:  49mL GADAVIST GADOBUTROL 1 MMOL/ML IV SOLN COMPARISON:  MR head 01/13/2013 FINDINGS: Brain: There is no acute intracranial hemorrhage, extra-axial fluid collection, or acute infarct. Background parenchymal volume is normal. The ventricles are normal in size. Gray-white differentiation is preserved. There are scattered small foci of FLAIR signal abnormality in the supratentorial white matter likely reflecting sequela of mild chronic white matter microangiopathy. There is no suspicious parenchymal signal abnormality. There is no abnormal enhancement. There is no mass lesion there is no mass effect or midline shift. Vascular: Normal flow voids. Skull and upper cervical spine: Normal marrow signal. Sinuses/Orbits:  The paranasal sinuses are clear. The globes and orbits are unremarkable. Other: None. IMPRESSION: No evidence of intracranial metastatic disease. Electronically Signed   By: Valetta Mole M.D.   On: 11/23/2021 16:22   DG Chest 2 View  Result Date: 11/21/2021 CLINICAL DATA:  Increased right-sided chest discomfort. Cough and shortness of breath. COPD exacerbation. EXAM: CHEST - 2 VIEW COMPARISON:  Chest CT 11/07/2021. Chest and rib radiographs 10/22/2021 FINDINGS: Increased bronchial thickening from prior exam. The heart is normal in size. The left upper lobe nodules on CT are not well seen by radiograph. No pleural effusion or pneumothorax. Stable osseous structures. IMPRESSION: 1. Increased bronchial thickening from prior exam, may represent acute bronchitis/COPD exacerbation. 2. The known left upper lobe nodule on recent CT is not well seen by radiograph. Electronically Signed   By: Keith Rake M.D.   On: 11/21/2021 17:29   CT CHEST WO CONTRAST  Result Date: 11/08/2021 CLINICAL DATA:  Chronic sinus issues. EXAM: CT CHEST WITHOUT CONTRAST TECHNIQUE: Multidetector CT imaging of the chest was performed following the standard protocol without IV contrast. RADIATION DOSE REDUCTION: This exam was performed according to the departmental dose-optimization program which includes automated exposure control, adjustment of the mA and/or kV according to patient size and/or use of iterative reconstruction technique. COMPARISON:  Chest x-ray on 09/06/2021, CT chest on 06/02/2019 FINDINGS: Cardiovascular: Heart size is normal. There is dense atherosclerotic calcification of the coronary arteries and aortic root. No pericardial effusion. There is atherosclerotic calcification of the thoracic aorta. No associated aneurysm. The noncontrast appearance of the pulmonary arteries is unremarkable. Mediastinum/Nodes: The visualized portion of the thyroid gland has a normal appearance. Esophagus is unremarkable. There is  increased LEFT hilar density, consistent with adenopathy. However, discrete nodes cannot be measured given the lack of intravenous contrast. RIGHT hilar region is unremarkable. Largest precarinal lymph node is 9 millimeters in short axis on image 54 of series 3. Bilateral axillary lymph nodes are mildly prominent but shows long-term stability since 2013. Lungs/Pleura: peripherally also in the UPPER lobe, there is irregular/nodule adjacent to cystic lucency. The solid portion of the nodule is 1.5 x 1.3 centimeters. The cystic portion is 1.2 centimeters. These densities are confluent with the LEFT hilar region. No pleural effusions. No focal consolidations. RIGHT lung is  clear. Airways are patent. Upper Abdomen: No acute abnormality.  Gallbladder is present. Musculoskeletal: There is cortical destruction of the RIGHT anterior fourth rib, suspicious for metastatic disease. IMPRESSION: 1. Findings are suspicious for bronchogenic carcinoma in the LEFT UPPER lobe. There are 2 components. An irregular bandlike mass traverses the LEFT UPPER lobe. A second 1.4 centimeter solid nodule is identified adjacent to a 1.2 cystic component in the posterior LEFT UPPER lobe. These changes are contiguous with prominent LEFT hilar densities suspicious for adenopathy. 2. Per Fleischner Society Guidelines, consider a non-contrast Chest CT at 3 months, a PET/CT, or tissue sampling. These guidelines do not apply to immunocompromised patients and patients with cancer. Follow up in patients with significant comorbidities as clinically warranted. For lung cancer screening, adhere to Lung-RADS guidelines. Reference: Radiology. 2017; 284(1):228-43. 3. Suspect metastatic disease of the RIGHT anterior fourth rib. 4.  Aortic atherosclerosis.  (ICD10-I70.0) These results will be called to the ordering clinician or representative by the Radiologist Assistant, and communication documented in the PACS or [employer]. Electronically Signed   By:  Nolon Nations M.D.   On: 11/08/2021 14:27   CT MAXILLOFACIAL WO CONTRAST  Result Date: 11/07/2021 CLINICAL DATA:  Pt c/o chronic sinus issues EXAM: CT MAXILLOFACIAL WITHOUT CONTRAST TECHNIQUE: Multidetector CT imaging of the maxillofacial structures was performed. Multiplanar CT image reconstructions were also generated. RADIATION DOSE REDUCTION: This exam was performed according to the departmental dose-optimization program which includes automated exposure control, adjustment of the mA and/or kV according to patient size and/or use of iterative reconstruction technique. COMPARISON:  None Available. FINDINGS: Osseous: No fracture or mandibular dislocation. No destructive process. Orbits: Negative. No traumatic or inflammatory finding. Sinuses: Clear. Soft tissues: Negative. Limited intracranial: No significant or unexpected finding. IMPRESSION: Unremarkable CT max face. Electronically Signed   By: Iven Finn M.D.   On: 11/07/2021 19:28    Labs:  CBC: Recent Labs    03/15/21 1915 06/04/21 1244 12/06/21 0620  WBC 131.3* 143.3* 186.8*  HGB 14.3 14.4 12.8*  HCT 45.6 46.5 42.9  PLT 114* 142* 213    COAGS: Recent Labs    12/06/21 0620  INR 1.1    BMP: Recent Labs    03/15/21 1915 06/04/21 1244  NA 134* 143  K 5.1 5.4*  CL 103 111  CO2 25 26  GLUCOSE 112* 103*  BUN 23 23  CALCIUM 8.6* 9.1  CREATININE 1.04 1.06  GFRNONAA >60 >60    LIVER FUNCTION TESTS: Recent Labs    06/04/21 1244  BILITOT 0.4  AST 19  ALT 14  ALKPHOS 126  PROT 6.6  ALBUMIN 4.2    TUMOR MARKERS: No results for input(s): "AFPTM", "CEA", "CA199", "CHROMGRNA" in the last 8760 hours.  Assessment and Plan:  History of CLL; new left upper lobe lung masses concerning for lung cancer with bony metastases: Alfred Brown, 71 year old male, presents today for an image-guided bone lesion biopsy.  Risks and benefits of this procedure were discussed with the patient and/or patient's family  including, but not limited to bleeding, infection, damage to adjacent structures or low yield requiring additional tests.  All of the questions were answered and there is agreement to proceed. He has been NPO.   Consent signed and in chart.  Thank you for this interesting consult.  I greatly enjoyed meeting Alfred Brown and look forward to participating in their care.  A copy of this report was sent to the requesting provider on this date.  Electronically Signed:  Soyla Dryer, [place] [phone] 12/06/2021, 7:40 AM   I spent a total of  30 Minutes   in face to face in clinical consultation, greater than 50% of which was counseling/coordinating care for bone lesion biopsy

## 2021-12-06 NOTE — Procedures (Signed)
Interventional Radiology Procedure Note  Procedure: CT guided bx of right iliac bone lesion, lytic.  Complications: None EBL: None Recommendations: - Bedrest 1 hours.   - Routine wound care - Follow up pathology - Advance diet   Signed,  Corrie Mckusick, DO

## 2021-12-07 ENCOUNTER — Other Ambulatory Visit: Payer: Self-pay | Admitting: Hematology

## 2021-12-07 LAB — SURGICAL PATHOLOGY

## 2021-12-08 LAB — PATHOLOGIST SMEAR REVIEW

## 2021-12-12 ENCOUNTER — Other Ambulatory Visit: Payer: Self-pay | Admitting: Physician Assistant

## 2021-12-12 DIAGNOSIS — D801 Nonfamilial hypogammaglobulinemia: Secondary | ICD-10-CM | POA: Diagnosis not present

## 2021-12-12 DIAGNOSIS — J449 Chronic obstructive pulmonary disease, unspecified: Secondary | ICD-10-CM | POA: Diagnosis not present

## 2021-12-12 DIAGNOSIS — E785 Hyperlipidemia, unspecified: Secondary | ICD-10-CM | POA: Diagnosis not present

## 2021-12-12 DIAGNOSIS — E860 Dehydration: Secondary | ICD-10-CM | POA: Diagnosis not present

## 2021-12-12 DIAGNOSIS — G893 Neoplasm related pain (acute) (chronic): Secondary | ICD-10-CM

## 2021-12-12 DIAGNOSIS — Z79899 Other long term (current) drug therapy: Secondary | ICD-10-CM | POA: Diagnosis not present

## 2021-12-12 DIAGNOSIS — M792 Neuralgia and neuritis, unspecified: Secondary | ICD-10-CM | POA: Diagnosis not present

## 2021-12-12 DIAGNOSIS — Z5181 Encounter for therapeutic drug level monitoring: Secondary | ICD-10-CM | POA: Diagnosis not present

## 2021-12-12 DIAGNOSIS — C3412 Malignant neoplasm of upper lobe, left bronchus or lung: Secondary | ICD-10-CM | POA: Diagnosis not present

## 2021-12-12 DIAGNOSIS — F17210 Nicotine dependence, cigarettes, uncomplicated: Secondary | ICD-10-CM | POA: Diagnosis not present

## 2021-12-12 DIAGNOSIS — Z510 Encounter for antineoplastic radiation therapy: Secondary | ICD-10-CM | POA: Diagnosis not present

## 2021-12-12 DIAGNOSIS — C7951 Secondary malignant neoplasm of bone: Secondary | ICD-10-CM | POA: Diagnosis not present

## 2021-12-12 DIAGNOSIS — K2960 Other gastritis without bleeding: Secondary | ICD-10-CM | POA: Diagnosis not present

## 2021-12-12 DIAGNOSIS — C9110 Chronic lymphocytic leukemia of B-cell type not having achieved remission: Secondary | ICD-10-CM | POA: Diagnosis not present

## 2021-12-12 DIAGNOSIS — C3490 Malignant neoplasm of unspecified part of unspecified bronchus or lung: Secondary | ICD-10-CM | POA: Diagnosis not present

## 2021-12-12 MED ORDER — GABAPENTIN 300 MG PO CAPS: ORAL_CAPSULE | ORAL | 0 refills | Status: DC

## 2021-12-12 NOTE — Progress Notes (Signed)
Received request per patient's radiation oncologist (Dr. Lynnette Caffey) for prescription of gabapentin for uncontrolled cancer related pain.  Per request, will send prescription for gabapentin 300 mg nightly x3 days, followed by gabapentin 300 mg twice daily x3 days, followed by gabapentin 300 mg 3 times daily.

## 2021-12-13 DIAGNOSIS — J449 Chronic obstructive pulmonary disease, unspecified: Secondary | ICD-10-CM | POA: Diagnosis not present

## 2021-12-13 DIAGNOSIS — C7951 Secondary malignant neoplasm of bone: Secondary | ICD-10-CM | POA: Diagnosis present

## 2021-12-13 DIAGNOSIS — E860 Dehydration: Secondary | ICD-10-CM | POA: Diagnosis not present

## 2021-12-13 DIAGNOSIS — C9110 Chronic lymphocytic leukemia of B-cell type not having achieved remission: Secondary | ICD-10-CM | POA: Diagnosis not present

## 2021-12-13 DIAGNOSIS — Z510 Encounter for antineoplastic radiation therapy: Secondary | ICD-10-CM | POA: Diagnosis not present

## 2021-12-13 DIAGNOSIS — C3412 Malignant neoplasm of upper lobe, left bronchus or lung: Secondary | ICD-10-CM | POA: Diagnosis not present

## 2021-12-14 DIAGNOSIS — J449 Chronic obstructive pulmonary disease, unspecified: Secondary | ICD-10-CM | POA: Diagnosis not present

## 2021-12-14 DIAGNOSIS — C7951 Secondary malignant neoplasm of bone: Secondary | ICD-10-CM | POA: Diagnosis not present

## 2021-12-14 DIAGNOSIS — Z510 Encounter for antineoplastic radiation therapy: Secondary | ICD-10-CM | POA: Diagnosis not present

## 2021-12-14 DIAGNOSIS — C3412 Malignant neoplasm of upper lobe, left bronchus or lung: Secondary | ICD-10-CM | POA: Diagnosis not present

## 2021-12-14 DIAGNOSIS — C9110 Chronic lymphocytic leukemia of B-cell type not having achieved remission: Secondary | ICD-10-CM | POA: Diagnosis not present

## 2021-12-14 DIAGNOSIS — E860 Dehydration: Secondary | ICD-10-CM | POA: Diagnosis not present

## 2021-12-17 DIAGNOSIS — E860 Dehydration: Secondary | ICD-10-CM | POA: Diagnosis not present

## 2021-12-17 DIAGNOSIS — C3412 Malignant neoplasm of upper lobe, left bronchus or lung: Secondary | ICD-10-CM | POA: Diagnosis not present

## 2021-12-17 DIAGNOSIS — C7951 Secondary malignant neoplasm of bone: Secondary | ICD-10-CM | POA: Diagnosis not present

## 2021-12-17 DIAGNOSIS — C9110 Chronic lymphocytic leukemia of B-cell type not having achieved remission: Secondary | ICD-10-CM | POA: Diagnosis not present

## 2021-12-17 DIAGNOSIS — Z510 Encounter for antineoplastic radiation therapy: Secondary | ICD-10-CM | POA: Diagnosis not present

## 2021-12-17 DIAGNOSIS — J449 Chronic obstructive pulmonary disease, unspecified: Secondary | ICD-10-CM | POA: Diagnosis not present

## 2021-12-18 ENCOUNTER — Emergency Department (HOSPITAL_COMMUNITY): Payer: Medicare Other

## 2021-12-18 ENCOUNTER — Other Ambulatory Visit: Payer: Self-pay

## 2021-12-18 ENCOUNTER — Encounter (HOSPITAL_COMMUNITY): Payer: Self-pay | Admitting: Emergency Medicine

## 2021-12-18 ENCOUNTER — Inpatient Hospital Stay (HOSPITAL_COMMUNITY)
Admission: EM | Admit: 2021-12-18 | Discharge: 2021-12-20 | DRG: 392 | Disposition: A | Discharge status: Home / Self Care | Payer: Medicare Other | Attending: Internal Medicine | Admitting: Internal Medicine

## 2021-12-18 DIAGNOSIS — D72829 Elevated white blood cell count, unspecified: Secondary | ICD-10-CM | POA: Diagnosis present

## 2021-12-18 DIAGNOSIS — K2960 Other gastritis without bleeding: Principal | ICD-10-CM | POA: Diagnosis present

## 2021-12-18 DIAGNOSIS — J449 Chronic obstructive pulmonary disease, unspecified: Secondary | ICD-10-CM | POA: Diagnosis present

## 2021-12-18 DIAGNOSIS — F411 Generalized anxiety disorder: Secondary | ICD-10-CM | POA: Diagnosis not present

## 2021-12-18 DIAGNOSIS — Z720 Tobacco use: Secondary | ICD-10-CM | POA: Diagnosis present

## 2021-12-18 DIAGNOSIS — K5730 Diverticulosis of large intestine without perforation or abscess without bleeding: Secondary | ICD-10-CM | POA: Diagnosis not present

## 2021-12-18 DIAGNOSIS — R9431 Abnormal electrocardiogram [ECG] [EKG]: Secondary | ICD-10-CM | POA: Diagnosis not present

## 2021-12-18 DIAGNOSIS — F32A Depression, unspecified: Secondary | ICD-10-CM | POA: Diagnosis present

## 2021-12-18 DIAGNOSIS — R002 Palpitations: Secondary | ICD-10-CM | POA: Diagnosis present

## 2021-12-18 DIAGNOSIS — Z801 Family history of malignant neoplasm of trachea, bronchus and lung: Secondary | ICD-10-CM

## 2021-12-18 DIAGNOSIS — G893 Neoplasm related pain (acute) (chronic): Secondary | ICD-10-CM | POA: Diagnosis present

## 2021-12-18 DIAGNOSIS — Z79899 Other long term (current) drug therapy: Secondary | ICD-10-CM

## 2021-12-18 DIAGNOSIS — Z923 Personal history of irradiation: Secondary | ICD-10-CM

## 2021-12-18 DIAGNOSIS — Z823 Family history of stroke: Secondary | ICD-10-CM

## 2021-12-18 DIAGNOSIS — R911 Solitary pulmonary nodule: Secondary | ICD-10-CM | POA: Diagnosis not present

## 2021-12-18 DIAGNOSIS — I452 Bifascicular block: Secondary | ICD-10-CM | POA: Diagnosis present

## 2021-12-18 DIAGNOSIS — D801 Nonfamilial hypogammaglobulinemia: Secondary | ICD-10-CM | POA: Diagnosis present

## 2021-12-18 DIAGNOSIS — C9110 Chronic lymphocytic leukemia of B-cell type not having achieved remission: Secondary | ICD-10-CM | POA: Diagnosis not present

## 2021-12-18 DIAGNOSIS — Z8249 Family history of ischemic heart disease and other diseases of the circulatory system: Secondary | ICD-10-CM

## 2021-12-18 DIAGNOSIS — Z800 Family history of malignant neoplasm of digestive organs: Secondary | ICD-10-CM

## 2021-12-18 DIAGNOSIS — K760 Fatty (change of) liver, not elsewhere classified: Secondary | ICD-10-CM | POA: Diagnosis not present

## 2021-12-18 DIAGNOSIS — F1320 Sedative, hypnotic or anxiolytic dependence, uncomplicated: Secondary | ICD-10-CM | POA: Diagnosis present

## 2021-12-18 DIAGNOSIS — R112 Nausea with vomiting, unspecified: Principal | ICD-10-CM | POA: Diagnosis present

## 2021-12-18 DIAGNOSIS — R627 Adult failure to thrive: Secondary | ICD-10-CM | POA: Diagnosis present

## 2021-12-18 DIAGNOSIS — K219 Gastro-esophageal reflux disease without esophagitis: Secondary | ICD-10-CM | POA: Diagnosis present

## 2021-12-18 DIAGNOSIS — Z8616 Personal history of COVID-19: Secondary | ICD-10-CM

## 2021-12-18 DIAGNOSIS — C3412 Malignant neoplasm of upper lobe, left bronchus or lung: Secondary | ICD-10-CM | POA: Diagnosis present

## 2021-12-18 DIAGNOSIS — R079 Chest pain, unspecified: Secondary | ICD-10-CM | POA: Diagnosis not present

## 2021-12-18 DIAGNOSIS — R0781 Pleurodynia: Secondary | ICD-10-CM | POA: Diagnosis present

## 2021-12-18 DIAGNOSIS — T426X5A Adverse effect of other antiepileptic and sedative-hypnotic drugs, initial encounter: Secondary | ICD-10-CM | POA: Diagnosis present

## 2021-12-18 DIAGNOSIS — F17210 Nicotine dependence, cigarettes, uncomplicated: Secondary | ICD-10-CM | POA: Diagnosis present

## 2021-12-18 DIAGNOSIS — R5381 Other malaise: Secondary | ICD-10-CM | POA: Diagnosis present

## 2021-12-18 DIAGNOSIS — E785 Hyperlipidemia, unspecified: Secondary | ICD-10-CM | POA: Diagnosis present

## 2021-12-18 DIAGNOSIS — Z7982 Long term (current) use of aspirin: Secondary | ICD-10-CM

## 2021-12-18 DIAGNOSIS — R1110 Vomiting, unspecified: Secondary | ICD-10-CM | POA: Diagnosis present

## 2021-12-18 DIAGNOSIS — R918 Other nonspecific abnormal finding of lung field: Secondary | ICD-10-CM | POA: Diagnosis present

## 2021-12-18 DIAGNOSIS — C7951 Secondary malignant neoplasm of bone: Secondary | ICD-10-CM | POA: Diagnosis present

## 2021-12-18 LAB — CBC WITH DIFFERENTIAL/PLATELET
Abs Immature Granulocytes: 0 10*3/uL (ref 0.00–0.07)
Basophils Absolute: 0 10*3/uL (ref 0.0–0.1)
Basophils Relative: 0 %
Eosinophils Absolute: 0 10*3/uL (ref 0.0–0.5)
Eosinophils Relative: 0 %
HCT: 42.2 % (ref 39.0–52.0)
Hemoglobin: 13.1 g/dL (ref 13.0–17.0)
Lymphocytes Relative: 91 %
Lymphs Abs: 169 10*3/uL — ABNORMAL HIGH (ref 0.7–4.0)
MCH: 28.3 pg (ref 26.0–34.0)
MCHC: 31 g/dL (ref 30.0–36.0)
MCV: 91.1 fL (ref 80.0–100.0)
Monocytes Absolute: 5.6 10*3/uL — ABNORMAL HIGH (ref 0.1–1.0)
Monocytes Relative: 3 %
Neutro Abs: 11.1 10*3/uL — ABNORMAL HIGH (ref 1.7–7.7)
Neutrophils Relative %: 6 %
Platelets: 270 10*3/uL (ref 150–400)
RBC: 4.63 MIL/uL (ref 4.22–5.81)
RDW: 15.8 % — ABNORMAL HIGH (ref 11.5–15.5)
WBC: 185.7 10*3/uL (ref 4.0–10.5)
nRBC: 0 % (ref 0.0–0.2)

## 2021-12-18 LAB — RAPID URINE DRUG SCREEN, HOSP PERFORMED
Amphetamines: NOT DETECTED
Barbiturates: NOT DETECTED
Benzodiazepines: NOT DETECTED
Cocaine: NOT DETECTED
Opiates: NOT DETECTED
Tetrahydrocannabinol: NOT DETECTED

## 2021-12-18 LAB — COMPREHENSIVE METABOLIC PANEL
ALT: 19 U/L (ref 0–44)
AST: 15 U/L (ref 15–41)
Albumin: 3.5 g/dL (ref 3.5–5.0)
Alkaline Phosphatase: 96 U/L (ref 38–126)
Anion gap: 11 (ref 5–15)
BUN: 26 mg/dL — ABNORMAL HIGH (ref 8–23)
CO2: 24 mmol/L (ref 22–32)
Calcium: 9.2 mg/dL (ref 8.9–10.3)
Chloride: 101 mmol/L (ref 98–111)
Creatinine, Ser: 0.8 mg/dL (ref 0.61–1.24)
GFR, Estimated: >60 mL/min (ref >60)
Glucose, Bld: 123 mg/dL — ABNORMAL HIGH (ref 70–99)
Potassium: 3.7 mmol/L (ref 3.5–5.1)
Sodium: 136 mmol/L (ref 135–145)
Total Bilirubin: 1 mg/dL (ref 0.3–1.2)
Total Protein: 6.3 g/dL — ABNORMAL LOW (ref 6.5–8.1)

## 2021-12-18 LAB — URINALYSIS, ROUTINE W REFLEX MICROSCOPIC
Bacteria, UA: NONE SEEN
Bilirubin Urine: NEGATIVE
Glucose, UA: NEGATIVE mg/dL
Ketones, ur: 20 mg/dL — AB
Leukocytes,Ua: NEGATIVE
Nitrite: NEGATIVE
Protein, ur: NEGATIVE mg/dL
Specific Gravity, Urine: 1.02 (ref 1.005–1.030)
pH: 6 (ref 5.0–8.0)

## 2021-12-18 LAB — HIV ANTIBODY (ROUTINE TESTING W REFLEX): HIV Screen 4th Generation wRfx: NONREACTIVE

## 2021-12-18 LAB — CBG MONITORING, ED: Glucose-Capillary: 122 mg/dL — ABNORMAL HIGH (ref 70–99)

## 2021-12-18 LAB — CBC
HCT: 41.6 % (ref 39.0–52.0)
Hemoglobin: 13.2 g/dL (ref 13.0–17.0)
MCH: 28.6 pg (ref 26.0–34.0)
MCHC: 31.7 g/dL (ref 30.0–36.0)
MCV: 90 fL (ref 80.0–100.0)
Platelets: 258 10*3/uL (ref 150–400)
RBC: 4.62 MIL/uL (ref 4.22–5.81)
RDW: 15.2 % (ref 11.5–15.5)
WBC: 174.9 10*3/uL (ref 4.0–10.5)
nRBC: 0 % (ref 0.0–0.2)

## 2021-12-18 LAB — SARS CORONAVIRUS 2 BY RT PCR: SARS Coronavirus 2 by RT PCR: NEGATIVE

## 2021-12-18 LAB — CREATININE, SERUM
Creatinine, Ser: 0.77 mg/dL (ref 0.61–1.24)
GFR, Estimated: >60 mL/min (ref >60)

## 2021-12-18 LAB — LACTIC ACID, PLASMA
Lactic Acid, Venous: 1.5 mmol/L (ref 0.5–1.9)
Lactic Acid, Venous: 0.9 mmol/L (ref 0.5–1.9)

## 2021-12-18 LAB — MAGNESIUM: Magnesium: 2 mg/dL (ref 1.7–2.4)

## 2021-12-18 MED ORDER — METOPROLOL TARTRATE 25 MG PO TABS
37.5000 mg | ORAL_TABLET | Freq: Two times a day (BID) | ORAL | Status: DC
  Administered 2021-12-18 – 2021-12-19 (×2): 37.5 mg via ORAL
  Filled 2021-12-18 (×2): qty 2

## 2021-12-18 MED ORDER — IOHEXOL 300 MG/ML  SOLN
100.0000 mL | Freq: Once | INTRAMUSCULAR | Status: AC | PRN
  Administered 2021-12-18: 100 mL via INTRAVENOUS

## 2021-12-18 MED ORDER — HYDROMORPHONE HCL 1 MG/ML IJ SOLN
0.5000 mg | Freq: Once | INTRAMUSCULAR | Status: AC
  Administered 2021-12-18: 0.5 mg via INTRAVENOUS
  Filled 2021-12-18: qty 0.5

## 2021-12-18 MED ORDER — FLUTICASONE PROPIONATE 50 MCG/ACT NA SUSP: 2.0000 | Freq: Every day | NASAL | Status: DC | PRN

## 2021-12-18 MED ORDER — LIDOCAINE 5 % EX PTCH
1.0000 | MEDICATED_PATCH | CUTANEOUS | Status: DC
  Administered 2021-12-18 – 2021-12-20 (×3): 1 via TRANSDERMAL
  Filled 2021-12-18 (×3): qty 1

## 2021-12-18 MED ORDER — OXYCODONE HCL 5 MG PO TABS
10.0000 mg | ORAL_TABLET | ORAL | Status: DC | PRN
  Administered 2021-12-19 – 2021-12-20 (×3): 10 mg via ORAL
  Filled 2021-12-18 (×4): qty 2

## 2021-12-18 MED ORDER — BISACODYL 5 MG PO TBEC: 5.0000 mg | DELAYED_RELEASE_TABLET | Freq: Every day | ORAL | Status: DC | PRN

## 2021-12-18 MED ORDER — PROCHLORPERAZINE EDISYLATE 10 MG/2ML IJ SOLN
10.0000 mg | INTRAMUSCULAR | Status: DC | PRN
  Administered 2021-12-18: 10 mg via INTRAVENOUS
  Filled 2021-12-18: qty 2

## 2021-12-18 MED ORDER — ALPRAZOLAM 0.5 MG PO TABS
0.5000 mg | ORAL_TABLET | Freq: Three times a day (TID) | ORAL | Status: DC | PRN
  Administered 2021-12-18 – 2021-12-20 (×3): 0.5 mg via ORAL
  Filled 2021-12-18 (×4): qty 1

## 2021-12-18 MED ORDER — PRAVASTATIN SODIUM 10 MG PO TABS
20.0000 mg | ORAL_TABLET | Freq: Every day | ORAL | Status: DC
  Administered 2021-12-18 – 2021-12-19 (×2): 20 mg via ORAL
  Filled 2021-12-18 (×2): qty 2

## 2021-12-18 MED ORDER — DEXAMETHASONE 4 MG PO TABS
8.0000 mg | ORAL_TABLET | Freq: Two times a day (BID) | ORAL | Status: DC
  Administered 2021-12-18 – 2021-12-20 (×4): 8 mg via ORAL
  Filled 2021-12-18 (×4): qty 2

## 2021-12-18 MED ORDER — ONDANSETRON HCL 4 MG PO TABS: 4.0000 mg | ORAL_TABLET | Freq: Four times a day (QID) | ORAL | Status: DC | PRN

## 2021-12-18 MED ORDER — PANTOPRAZOLE SODIUM 40 MG IV SOLR
40.0000 mg | Freq: Two times a day (BID) | INTRAVENOUS | Status: DC
  Administered 2021-12-18 – 2021-12-20 (×5): 40 mg via INTRAVENOUS
  Filled 2021-12-18 (×5): qty 10

## 2021-12-18 MED ORDER — HYDROMORPHONE HCL 1 MG/ML IJ SOLN
0.5000 mg | INTRAMUSCULAR | Status: DC | PRN
  Administered 2021-12-18 – 2021-12-20 (×9): 0.5 mg via INTRAVENOUS
  Filled 2021-12-18 (×10): qty 0.5

## 2021-12-18 MED ORDER — ONDANSETRON HCL 4 MG/2ML IJ SOLN
4.0000 mg | Freq: Once | INTRAMUSCULAR | Status: AC
  Administered 2021-12-18: 4 mg via INTRAVENOUS
  Filled 2021-12-18: qty 2

## 2021-12-18 MED ORDER — IPRATROPIUM-ALBUTEROL 0.5-2.5 (3) MG/3ML IN SOLN: 3.0000 mL | RESPIRATORY_TRACT | Status: DC | PRN

## 2021-12-18 MED ORDER — ONDANSETRON HCL 4 MG/2ML IJ SOLN
4.0000 mg | Freq: Four times a day (QID) | INTRAMUSCULAR | Status: DC | PRN
  Administered 2021-12-18 – 2021-12-19 (×2): 4 mg via INTRAVENOUS
  Filled 2021-12-18 (×3): qty 2

## 2021-12-18 MED ORDER — LACTATED RINGERS IV BOLUS
1000.0000 mL | Freq: Once | INTRAVENOUS | Status: AC
  Administered 2021-12-18: 1000 mL via INTRAVENOUS

## 2021-12-18 MED ORDER — ENOXAPARIN SODIUM 40 MG/0.4ML IJ SOSY
40.0000 mg | PREFILLED_SYRINGE | INTRAMUSCULAR | Status: DC
  Administered 2021-12-18 – 2021-12-20 (×3): 40 mg via SUBCUTANEOUS
  Filled 2021-12-18 (×3): qty 0.4

## 2021-12-18 MED ORDER — LORATADINE 10 MG PO TABS
10.0000 mg | ORAL_TABLET | Freq: Every day | ORAL | Status: DC
  Filled 2021-12-18: qty 1

## 2021-12-18 MED ORDER — TRAZODONE HCL 50 MG PO TABS
25.0000 mg | ORAL_TABLET | Freq: Every evening | ORAL | Status: DC | PRN
  Administered 2021-12-19: 25 mg via ORAL
  Filled 2021-12-18: qty 1

## 2021-12-18 MED ORDER — NICOTINE 21 MG/24HR TD PT24: 21.0000 mg | MEDICATED_PATCH | Freq: Every day | TRANSDERMAL | Status: DC | PRN

## 2021-12-18 MED ORDER — LACTATED RINGERS IV SOLN: INTRAVENOUS | Status: DC

## 2021-12-18 MED ORDER — HYDROMORPHONE HCL 1 MG/ML IJ SOLN: 0.7500 mg | INTRAMUSCULAR | Status: DC | PRN

## 2021-12-18 MED ORDER — ACETAMINOPHEN 325 MG PO TABS: 650.0000 mg | ORAL_TABLET | Freq: Four times a day (QID) | ORAL | Status: DC | PRN

## 2021-12-18 MED ORDER — BUPROPION HCL ER (SR) 150 MG PO TB12
150.0000 mg | ORAL_TABLET | Freq: Every day | ORAL | Status: DC
  Administered 2021-12-19 – 2021-12-20 (×2): 150 mg via ORAL
  Filled 2021-12-18 (×2): qty 1

## 2021-12-18 MED ORDER — METOCLOPRAMIDE HCL 5 MG/ML IJ SOLN
10.0000 mg | Freq: Once | INTRAMUSCULAR | Status: AC
  Administered 2021-12-18: 10 mg via INTRAVENOUS
  Filled 2021-12-18: qty 2

## 2021-12-18 MED ORDER — ACETAMINOPHEN 650 MG RE SUPP: 650.0000 mg | Freq: Four times a day (QID) | RECTAL | Status: DC | PRN

## 2021-12-18 NOTE — Assessment & Plan Note (Addendum)
-   Benzodiazepine dependent, resume alprazolam as needed and daily wellbutrin

## 2021-12-18 NOTE — Assessment & Plan Note (Addendum)
-   Metastatic non-small cell lung cancer, recently started radiation treatments

## 2021-12-18 NOTE — Hospital Course (Signed)
71 year old gentleman with longstanding history of CLL diagnosed in 2010 and now on surveillance, hypogammaglobulinemia, COPD, depression anxiety, history of COVID infection, current smoker smoker 45-pack-year, newly discovered left upper lobe lung mass determined to be metastatic to right iliac bone and right second and fourth ribs.  He was recently started on radiation treatments.  He has been suffering with difficult to control cancer pain.  He reports that he has been having nausea and vomiting for the past several days.  He has overall malaise.  He has some chest pain symptoms.  He has been dry heaving and he has been having difficulty keeping fluids down and keeping his medications down.  Of note, he was recently started on gabapentin to assist with uncontrolled cancer pain after having had a bone lesion biopsy on 12/06/2021.  He was treated in the ED with pain and nausea medication and IV fluids; however his symptoms remain uncontrolled and admission was requested for ongoing management.

## 2021-12-18 NOTE — Assessment & Plan Note (Signed)
-   Secondary to CLL, no need to repeat CBC in a.m.

## 2021-12-18 NOTE — Assessment & Plan Note (Signed)
-   Secondary to metastatic cancer, treating with pain management and other supportive measures

## 2021-12-18 NOTE — Assessment & Plan Note (Signed)
-  secondary to gastritis, opioids - emesis started after XRT, but pt had FTT s/s prior to XRT - Possibly related to recently starting gabapentin which we will hold at this time - Continue supportive measures with IV fluids and IV nausea medications - over 24 hour period, pt clinically improved and his diet was advanced which he tolerated

## 2021-12-18 NOTE — ED Triage Notes (Signed)
Pt to the ED with emesis that began yesterday after he had a radiation treatment.

## 2021-12-18 NOTE — H&P (Signed)
History and Physical  [facility]  Alfred Brown [place] RDE:[id number] DOB: 03-05-1951 DOA: 12/18/2021  PCP: Alfred School, MD  Patient coming from: Home  Level of care: Med-Surg  I have personally briefly reviewed patient's old medical records in [place]  Chief Complaint: nausea and vomiting  HPI: Alfred Brown is a 71 year old gentleman with longstanding history of CLL diagnosed in 2010 and now on surveillance, hypogammaglobulinemia, COPD, depression anxiety, history of COVID infection, current smoker smoker 45-pack-year, newly discovered left upper lobe lung mass determined to be metastatic to right iliac bone and right second and fourth ribs.  He was recently started on radiation treatments.  He has been suffering with difficult to control cancer pain.  He reports that he has been having nausea and vomiting for the past several days.  He has overall malaise.  He has some chest pain symptoms.  He has been dry heaving and he has been having difficulty keeping fluids down and keeping his medications down.  Of note, he was recently started on gabapentin to assist with uncontrolled cancer pain after having had a bone lesion biopsy on 12/06/2021.  He was treated in the ED with pain and nausea medication and IV fluids; however his symptoms remain uncontrolled and admission was requested for ongoing management.   Past Medical History:  Diagnosis Date   Aortic valve disorder    Mild insufficiency   Chronic lymphocytic leukemia ([place])    01/2012: WBC-90.2, H&H-15.1/45.3, platelets-185 03/31/12: Verified with Dr. Tressie Stalker that no precautions or modification of medical regime are required prior to orthopaedic surgery.    CLL (chronic lymphocytic leukemia) (HCC)    CMV (cytomegalovirus) (HCC)    COPD (chronic obstructive pulmonary disease) (HCC)    Depression with anxiety    DJD (degenerative joint disease)    GERD (gastroesophageal reflux disease)    Hyperlipidemia     Hypogammaglobulinemia ([place]) 09/23/2019   Lipoma    left chest wall   Palpitations 2004   PVCs; borderline stress nuclear in 2004, negative in 2007; Echo 2007; AV-sclerotic, very mild AI   Pneumonia    Right bundle branch block    + left posterior fascicular block   Tobacco abuse    80 pack years    Past Surgical History:  Procedure Laterality Date   COLONOSCOPY  01/31/2012   Negative screening study   GANGLION CYST EXCISION  04/02/1995   left wrist   LIPOMA EXCISION Left 10/11/2021   chest wall   ROTATOR CUFF REPAIR  04/02/1995   right   SHOULDER ARTHROSCOPY WITH SUBACROMIAL DECOMPRESSION  04/09/2012   Procedure: SHOULDER ARTHROSCOPY WITH SUBACROMIAL DECOMPRESSION;  Surgeon: Ninetta Lights, MD;  Location: [place];  Service: Orthopedics;  Laterality: Left;  LEFT SHOULDER ARTHROSCOPY, SUBACROMIAL DECOMPRESSION, PARTIAL ACROMIOPLASTY WITH CORACOACROMIAL RELEASE, DISTAL CLAVICULECTOMY WITH ROTATOR CUFF REPAIR, DEBRIDEMENT OF LABRUM     reports that he has been smoking cigarettes. He started smoking about 58 years ago. He has a 45.00 pack-year smoking history. He has never used smokeless tobacco. He reports that he does not drink alcohol and does not use drugs.  No Known Allergies  Family History  Problem Relation Age of Onset   Stroke Mother    Heart attack Father    Cancer Sister        colon   Colon cancer Sister    Cancer Brother        2 brothers died with lung cancer    Prior to Admission  medications   Medication Sig Start Date End Date Taking? Authorizing Provider  albuterol (VENTOLIN HFA) 108 (90 Base) MCG/ACT inhaler 2 puffs every 4 hours as needed if you can't catch your breath 12/04/21  Yes Tanda Rockers, MD  ALPRAZolam Duanne Moron) 1 MG tablet Take 1 mg by mouth 3 (three) times daily as needed for anxiety.    Yes [provider]  aspirin 325 MG tablet Take 325 mg by mouth daily.   Yes [provider]  buPROPion (WELLBUTRIN SR) 150 MG  12 hr tablet Take 150 mg by mouth daily.   Yes [provider]  calcium carbonate (OS-CAL) 600 MG TABS Take 600 mg by mouth daily.   Yes [provider]  celecoxib (CELEBREX) 100 MG capsule Take 100 mg by mouth 2 (two) times daily.   Yes [provider]  clotrimazole-betamethasone (LOTRISONE) cream Apply 1 application topically daily as needed. Affected area 06/19/15  Yes [provider]  dexamethasone (DECADRON) 4 MG tablet Take 2 tablets by mouth 2 (two) times daily. 12/12/21  Yes [provider]  diltiazem (CARDIZEM) 30 MG tablet Take 30 mg by mouth as needed.   Yes [provider]  fish oil-omega-3 fatty acids 1000 MG capsule Take 1 g by mouth daily.   Yes [provider]  fluticasone (FLONASE) 50 MCG/ACT nasal spray Place 2 sprays into the nose daily.   Yes [provider]  lidocaine (LIDODERM) 5 % Place 1 patch onto the skin daily. Remove and Discard patch within 12 hours. 11/28/21  Yes Derek Jack, MD  Loratadine 10 MG CAPS Take 10 mg by mouth daily.   Yes [provider]  lovastatin (MEVACOR) 20 MG tablet Take 20 mg by mouth daily. 09/29/14  Yes [provider]  Metoprolol Tartrate 37.5 MG TABS Take 37.5 mg by mouth 2 (two) times daily. 11/07/21  Yes BranchAlphonse Guild, MD  Multiple Vitamin (MULTIVITAMIN) tablet Take 1 tablet by mouth daily.   Yes [provider]  niacin (NIASPAN) 500 MG CR tablet Take 500 mg by mouth daily. 09/29/14  Yes [provider]  omeprazole (PRILOSEC) 20 MG capsule Take 20 mg by mouth daily. 12/12/21  Yes [provider]  Oxycodone HCl 10 MG TABS Take 1 tablet (10 mg total) by mouth every 4 (four) hours as needed. 12/04/21  Yes Derek Jack, MD  gabapentin (NEURONTIN) 300 MG capsule Take 1 capsule (300 mg total) by mouth at bedtime for 3 days, THEN 1 capsule (300 mg total) 2 (two) times daily for 3 days, THEN 1 capsule (300 mg total) 3 (three)  times daily for 24 days. Patient not taking: Reported on 12/18/2021 12/12/21 01/11/22  Harriett Rush, PA-C    Physical Exam: Vitals:   12/18/21 1200 12/18/21 1230 12/18/21 1311 12/18/21 1430  BP: 133/73 (!) 143/85  (!) 151/77  Pulse: 87 97  92  Resp: 16 19  15   Temp:   97.9 F (36.6 C)   TempSrc:   Oral   SpO2: 99% 96%  98%  Weight:      Height:        Constitutional: frail, elderly male, NAD, calm, appears UNcomfortable Eyes: PERRL, lids and conjunctivae normal ENMT: Mucous membranes are dry. Posterior pharynx clear of any exudate or lesions.  Neck: normal, supple, no masses, no thyromegaly Respiratory: painful right rib cage. clear to auscultation bilaterally, no wheezing, no crackles. Normal respiratory effort. No accessory muscle use.  Cardiovascular: normal s1, s2 sounds, no  murmurs / rubs / gallops. No extremity edema. 2+ pedal pulses. No carotid bruits.  Abdomen: mild epigastric tenderness, no masses palpated. No hepatosplenomegaly. Bowel sounds positive.  Musculoskeletal: no clubbing / cyanosis. No joint deformity upper and lower extremities. Good ROM, no contractures. Normal muscle tone.  Skin: no rashes, lesions, ulcers. No induration Neurologic: CN 2-12 grossly intact. Sensation intact, DTR normal. Strength 5/5 in all 4.  Psychiatric: Normal judgment and insight. Alert and oriented x 3. Normal mood.   Labs on Admission: I have personally reviewed following labs and imaging studies  CBC: Recent Labs  Lab 12/18/21 0937  WBC 185.7*  NEUTROABS 11.1*  HGB 13.1  HCT 42.2  MCV 91.1  PLT 720   Basic Metabolic Panel: Recent Labs  Lab 12/18/21 0937  NA 136  K 3.7  CL 101  CO2 24  GLUCOSE 123*  BUN 26*  CREATININE 0.80  CALCIUM 9.2  MG 2.0   GFR: Estimated Creatinine Clearance: 92.1 mL/min (by C-G formula based on SCr of 0.8 mg/dL). Liver Function Tests: Recent Labs  Lab 12/18/21 0937  AST 15  ALT 19  ALKPHOS 96  BILITOT 1.0  PROT 6.3*   ALBUMIN 3.5   No results for input(s): "LIPASE", "AMYLASE" in the last 168 hours. No results for input(s): "AMMONIA" in the last 168 hours. Coagulation Profile: No results for input(s): "INR", "PROTIME" in the last 168 hours. Cardiac Enzymes: No results for input(s): "CKTOTAL", "CKMB", "CKMBINDEX", "TROPONINI" in the last 168 hours. BNP (last 3 results) No results for input(s): "PROBNP" in the last 8760 hours. HbA1C: No results for input(s): "HGBA1C" in the last 72 hours. CBG: Recent Labs  Lab 12/18/21 0948  GLUCAP 122*   Lipid Profile: No results for input(s): "CHOL", "HDL", "LDLCALC", "TRIG", "CHOLHDL", "LDLDIRECT" in the last 72 hours. Thyroid Function Tests: No results for input(s): "TSH", "T4TOTAL", "FREET4", "T3FREE", "THYROIDAB" in the last 72 hours. Anemia Panel: No results for input(s): "VITAMINB12", "FOLATE", "FERRITIN", "TIBC", "IRON", "RETICCTPCT" in the last 72 hours. Urine analysis:    Component Value Date/Time   COLORURINE YELLOW 12/18/2021 0833   APPEARANCEUR CLEAR 12/18/2021 0833   APPEARANCEUR Clear 11/24/2019 1047   LABSPEC 1.020 12/18/2021 0833   PHURINE 6.0 12/18/2021 0833   GLUCOSEU NEGATIVE 12/18/2021 0833   HGBUR MODERATE (A) 12/18/2021 0833   BILIRUBINUR NEGATIVE 12/18/2021 0833   BILIRUBINUR Negative 11/24/2019 1047   KETONESUR 20 (A) 12/18/2021 0833   PROTEINUR NEGATIVE 12/18/2021 0833   NITRITE NEGATIVE 12/18/2021 0833   LEUKOCYTESUR NEGATIVE 12/18/2021 0833    Radiological Exams on Admission: CT ABDOMEN PELVIS W CONTRAST  Result Date: 12/18/2021 CLINICAL DATA:  Nausea, vomiting EXAM: CT ABDOMEN AND PELVIS WITH CONTRAST TECHNIQUE: Multidetector CT imaging of the abdomen and pelvis was performed using the standard protocol following bolus administration of intravenous contrast. RADIATION DOSE REDUCTION: This exam was performed according to the departmental dose-optimization program which includes automated exposure control, adjustment of the  mA and/or kV according to patient size and/or use of iterative reconstruction technique. CONTRAST:  175mL OMNIPAQUE IOHEXOL 300 MG/ML  SOLN COMPARISON:  CT done on 12/07/2015, PET-CT done on 11/22/2021 FINDINGS: Lower chest: Visualized lower lung fields are unremarkable. There are scattered coronary artery calcifications. Hepatobiliary: There is fatty infiltration in liver. No focal abnormalities are seen. Gallbladder is unremarkable. Pancreas: No focal abnormalities are seen. Spleen: Spleen measures 15.1 cm in length. Adrenals/Urinary Tract: Adrenals are unremarkable. There is no hydronephrosis. There are no renal or ureteral stones. There are a few subcentimeter low-density  foci in renal cortex, possibly cysts. Urinary bladder is not distended. Stomach/Bowel: There is prominence of mucosal folds in stomach. There is no significant distention of stomach. There is no significant small bowel dilation. Appendix is not dilated. Multiple diverticula are seen in colon. There is no evidence of focal acute diverticulitis. Vascular/Lymphatic: Calcifications are seen in abdominal aorta and its major branches. There are slightly enlarged lymph nodes in mesentery and retroperitoneum. Largest of the nodes is noted close to porta bodies measuring 11 mm in short axis. Reproductive: Prostate is enlarged with inhomogeneous attenuation. Other: There is no ascites or pneumoperitoneum. Musculoskeletal: There is a large lytic lesion measuring up to 6 cm in diameter in right iliac bone. Spondylolysis is seen in L4 vertebra. There is first-degree spondylolisthesis at the L4-L5 level. There is encroachment of neural foramina from L3-S1 levels. IMPRESSION: There is no evidence of intestinal obstruction or pneumoperitoneum. There is no hydronephrosis. Appendix is not dilated. There is prominence of mucosal folds in the stomach which may be due to incomplete distention or suggest gastritis. Diverticulosis of colon without signs of focal  diverticulitis. There are slightly enlarged lymph nodes measuring up to 11 mm in short axis adjacent to body bodies, retroperitoneum and mesentery. Fatty liver. Enlarged spleen. Enlarged prostate. There is a large lytic lesion in the right iliac bone suggesting metastatic disease. Lumbar spondylosis. Other findings as described in the body of the report. Electronically Signed   By: Elmer Picker M.D.   On: 12/18/2021 11:50   DG Chest Portable 1 View  Result Date: 12/18/2021 CLINICAL DATA:  Chest pain. EXAM: PORTABLE CHEST 1 VIEW COMPARISON:  November 21, 2021.  November 07, 2021. FINDINGS: The heart size and mediastinal contours are within normal limits. Right lung is clear. Known left upper lobe pulmonary nodule is again noted concerning for malignancy. The visualized skeletal structures are unremarkable. IMPRESSION: Known left upper lobe pulmonary nodule is again noted concerning for malignancy. No other abnormality is noted. Electronically Signed   By: Marijo Conception M.D.   On: 12/18/2021 08:54    EKG: Independently reviewed.  Assessment/Plan Principal Problem:   Intractable nausea and vomiting Active Problems:   GERD (gastroesophageal reflux disease)   Hyperlipidemia   Palpitations   Chronic lymphocytic leukemia (HCC)   Tobacco abuse   COPD GOLD 0   Hypogammaglobulinemia (HCC)   Mass of left lung   Rib pain on right side   Leukocytosis   Cancer associated pain   Generalized anxiety disorder   Assessment and Plan: * Intractable nausea and vomiting - Appears to have had symptoms prior to starting radiation - Reports he is not on chemotherapy at this time - Possibly related to recently starting gabapentin which we will hold at this time - Continue supportive measures with IV fluids and IV nausea medications   Generalized anxiety disorder - Benzodiazepine dependent, resume alprazolam as needed and daily wellbutrin   Cancer associated pain - Providing IV and oral pain management  as tolerated in addition to topical lidocaine patch  Leukocytosis - Secondary to CLL, no need to repeat CBC in a.m.  Rib pain on right side - Secondary to metastatic cancer, treating with pain management and other supportive measures  Mass of left lung - Metastatic non-small cell lung cancer, recently started radiation treatments  Hypogammaglobulinemia ([place]) -From oncology notes, reportedly secondary to CLL  COPD GOLD 0 - Followed by Dr. Melvyn Novas as his primary pulmonologist -Resume home bronchodilators  Tobacco abuse - We will offer nicotine  patch while in hospital and counseled on cessation  Chronic lymphocytic leukemia ([place]) - Diagnosed in 2010, currently on surveillance, followed by Dr. Delton Coombes  Palpitations - Resume home twice daily metoprolol 37.5 mg  Hyperlipidemia - Resume home lovastatin if tolerated  GERD (gastroesophageal reflux disease) - IV Protonix for GI protection   DVT prophylaxis: enoxaparin  Code Status: full   Family Communication:   Disposition Plan: anticipate home in 24 hours   Consults called:   Admission status: OBS  Level of care: Med-Surg Irwin Brakeman MD [facility] How to contact the [facility] Attending or Consulting provider [place] or covering provider during after hours [place], for this patient?  Check the care team in [facility] and look for a) attending/consulting [facility] provider listed and b) the [facility] team listed Log into [email or web address] and use [place] universal password to access. If you do not have the password, please contact the hospital operator. Locate the [facility] provider you are looking for under [facility] and page to a number that you can be directly reached. If you still have difficulty reaching the provider, please page the [facility] (Director on Call) for the Hospitalists listed on amion for assistance.   If 7PM-7AM, please contact night-coverage [email or web address] Password [email or web address]  12/18/2021, 3:12 PM

## 2021-12-18 NOTE — ED Provider Notes (Signed)
[address] [facility] EMERGENCY DEPARTMENT Provider Note   CSN: [id number] Arrival date & time: 12/18/21  0923     History {Add pertinent medical, surgical, social history, OB history to HPI:1} Chief Complaint  Patient presents with   Emesis    Alfred Brown is a 71 y.o. male with CLL (diagnosed 2010), non-small cell lung cancer of left upper lobe, lytic lesions on right iliac bone and ribs, COPD, HLD, GERD, right bundle branch block, left posterior fascicular block, presents with nausea, vomiting.   Patient presents with wife who provides additional history. Patient states "just sick." Endorses some chest pain. Had first radiation treatment yesterday. Gave fluids/nausea medicine yesterday and he improved so they did his first treatment. Since then has been nauseated, dry heaving. Trying to push fluids but can't get keep pain medicine down. Does not have rx for nausea meds. Last BM was yesterday, normal for him, no hematochezia/melena, and patient is still passing gas. Endorses pain in his R leg and pelvis d/t his bony metastasis in that area. Per chart review, patient had a bone lesion biopsy on 12/06/2021.  On 9/13 patient's radiation oncologist requested prescription for gabapentin due to uncontrolled pain, prescribed course increased to gabapentin 300 mg 3 times daily.  Patient not currently undergoing chemotherapy does not normally do with nausea vomiting is an issue with his cancer.  HPI     Home Medications Prior to Admission medications   Medication Sig Start Date End Date Taking? Authorizing Provider  albuterol (VENTOLIN HFA) 108 (90 Base) MCG/ACT inhaler 2 puffs every 4 hours as needed if you can't catch your breath 12/04/21  Yes Tanda Rockers, MD  ALPRAZolam Duanne Moron) 1 MG tablet Take 1 mg by mouth 3 (three) times daily as needed for anxiety.    Yes [provider]  aspirin 325 MG tablet Take 325 mg by mouth daily.   Yes [provider]  buPROPion (WELLBUTRIN SR) 150 MG  12 hr tablet Take 150 mg by mouth daily.   Yes [provider]  calcium carbonate (OS-CAL) 600 MG TABS Take 600 mg by mouth daily.   Yes [provider]  celecoxib (CELEBREX) 100 MG capsule Take 100 mg by mouth 2 (two) times daily.   Yes [provider]  clotrimazole-betamethasone (LOTRISONE) cream Apply 1 application topically daily as needed. Affected area 06/19/15  Yes [provider]  dexamethasone (DECADRON) 4 MG tablet Take 2 tablets by mouth 2 (two) times daily. 12/12/21  Yes [provider]  diltiazem (CARDIZEM) 30 MG tablet Take 30 mg by mouth as needed.   Yes [provider]  fish oil-omega-3 fatty acids 1000 MG capsule Take 1 g by mouth daily.   Yes [provider]  fluticasone (FLONASE) 50 MCG/ACT nasal spray Place 2 sprays into the nose daily.   Yes [provider]  lidocaine (LIDODERM) 5 % Place 1 patch onto the skin daily. Remove and Discard patch within 12 hours. 11/28/21  Yes Derek Jack, MD  Loratadine 10 MG CAPS Take 10 mg by mouth daily.   Yes [provider]  lovastatin (MEVACOR) 20 MG tablet Take 20 mg by mouth daily. 09/29/14  Yes [provider]  Metoprolol Tartrate 37.5 MG TABS Take 37.5 mg by mouth 2 (two) times daily. 11/07/21  Yes BranchAlphonse Guild, MD  Multiple Vitamin (MULTIVITAMIN) tablet Take 1 tablet by mouth daily.   Yes [provider]  niacin (NIASPAN) 500 MG CR tablet Take 500 mg by mouth daily.  09/29/14  Yes [provider]  omeprazole (PRILOSEC) 20 MG capsule Take 20 mg by mouth daily. 12/12/21  Yes [provider]  Oxycodone HCl 10 MG TABS Take 1 tablet (10 mg total) by mouth every 4 (four) hours as needed. 12/04/21  Yes Derek Jack, MD  gabapentin (NEURONTIN) 300 MG capsule Take 1 capsule (300 mg total) by mouth at bedtime for 3 days, THEN 1 capsule (300 mg total) 2 (two) times daily for 3 days, THEN 1 capsule (300 mg total) 3 (three)  times daily for 24 days. Patient not taking: Reported on 12/18/2021 12/12/21 01/11/22  Harriett Rush, PA-C      Allergies    Patient has no known allergies.    Review of Systems   Review of Systems Review of systems negative for f/c.  A 10 point review of systems was performed and is negative unless otherwise reported in HPI.  Physical Exam Updated Vital Signs BP (!) 153/79   Pulse 75   Temp 97.9 F (36.6 C) (Oral)   Resp 17   Ht $R'6\' 1"'FO$  (1.854 m)   Wt 75.8 kg   SpO2 98%   BMI 22.03 kg/m  Physical Exam General: Chronically ill-appearing male, acutely uncomfortable. lying in bed.  HEENT: PERRLA, Sclera anicteric, very dry mucous membranes, trachea midline. Cardiology: RRR, no murmurs/rubs/gallops. BL radial and DP pulses equal bilaterally. Mild TTP of R anterior chest wall at location of metastases. Resp: Normal respiratory rate and effort. CTAB, no wheezes, rhonchi, crackles.  Abd: Soft, non-tender, non-distended. No rebound tenderness or guarding.  GU: Deferred. MSK: No peripheral edema or signs of trauma. Extremities without deformity or TTP. No cyanosis or clubbing. Skin: warm, dry. No rashes or lesions. Neuro: A&Ox4, CNs II-XII grossly intact. MAEs. Sensation grossly intact.  Psych: Normal mood and affect.   ED Results / Procedures / Treatments   Labs (all labs ordered are listed, but only abnormal results are displayed) Labs Reviewed  CBC WITH DIFFERENTIAL/PLATELET - Abnormal; Notable for the following components:      Result Value   WBC 185.7 (*)    RDW 15.8 (*)    Neutro Abs 11.1 (*)    Lymphs Abs 169.0 (*)    Monocytes Absolute 5.6 (*)    All other components within normal limits  COMPREHENSIVE METABOLIC PANEL - Abnormal; Notable for the following components:   Glucose, Bld 123 (*)    BUN 26 (*)    Total Protein 6.3 (*)    All other components within normal limits  URINALYSIS, ROUTINE W REFLEX MICROSCOPIC - Abnormal; Notable for the following  components:   Hgb urine dipstick MODERATE (*)    Ketones, ur 20 (*)    All other components within normal limits  CBG MONITORING, ED - Abnormal; Notable for the following components:   Glucose-Capillary 122 (*)    All other components within normal limits  SARS CORONAVIRUS 2 BY RT PCR  MAGNESIUM  LACTIC ACID, PLASMA  LACTIC ACID, PLASMA  HIV ANTIBODY (ROUTINE TESTING W REFLEX)  CBC  CREATININE, SERUM  RAPID URINE DRUG SCREEN, HOSP PERFORMED    EKG EKG Interpretation  Date/Time:  Tuesday December 18 2021 09:41:38 EDT Ventricular Rate:  79 PR Interval:  134 QRS Duration: 149 QT Interval:  429 QTC Calculation: 492 R Axis:   101 Text Interpretation: Sinus rhythm RBBB and LPFB Confirmed by Cindee Lame [phone]) on 12/18/2021 12:40:32 PM  Radiology CT ABDOMEN PELVIS W CONTRAST  Result Date: 12/18/2021 CLINICAL DATA:  Nausea, vomiting EXAM: CT ABDOMEN AND PELVIS WITH CONTRAST TECHNIQUE: Multidetector CT imaging of the abdomen and pelvis was performed using the standard protocol following bolus administration of intravenous contrast. RADIATION DOSE REDUCTION: This exam was performed according to the departmental dose-optimization program which includes automated exposure control, adjustment of the mA and/or kV according to patient size and/or use of iterative reconstruction technique. CONTRAST:  13mL OMNIPAQUE IOHEXOL 300 MG/ML  SOLN COMPARISON:  CT done on 12/07/2015, PET-CT done on 11/22/2021 FINDINGS: Lower chest: Visualized lower lung fields are unremarkable. There are scattered coronary artery calcifications. Hepatobiliary: There is fatty infiltration in liver. No focal abnormalities are seen. Gallbladder is unremarkable. Pancreas: No focal abnormalities are seen. Spleen: Spleen measures 15.1 cm in length. Adrenals/Urinary Tract: Adrenals are unremarkable. There is no hydronephrosis. There are no renal or ureteral stones. There are a few subcentimeter low-density foci in renal cortex,  possibly cysts. Urinary bladder is not distended. Stomach/Bowel: There is prominence of mucosal folds in stomach. There is no significant distention of stomach. There is no significant small bowel dilation. Appendix is not dilated. Multiple diverticula are seen in colon. There is no evidence of focal acute diverticulitis. Vascular/Lymphatic: Calcifications are seen in abdominal aorta and its major branches. There are slightly enlarged lymph nodes in mesentery and retroperitoneum. Largest of the nodes is noted close to porta bodies measuring 11 mm in short axis. Reproductive: Prostate is enlarged with inhomogeneous attenuation. Other: There is no ascites or pneumoperitoneum. Musculoskeletal: There is a large lytic lesion measuring up to 6 cm in diameter in right iliac bone. Spondylolysis is seen in L4 vertebra. There is first-degree spondylolisthesis at the L4-L5 level. There is encroachment of neural foramina from L3-S1 levels. IMPRESSION: There is no evidence of intestinal obstruction or pneumoperitoneum. There is no hydronephrosis. Appendix is not dilated. There is prominence of mucosal folds in the stomach which may be due to incomplete distention or suggest gastritis. Diverticulosis of colon without signs of focal diverticulitis. There are slightly enlarged lymph nodes measuring up to 11 mm in short axis adjacent to body bodies, retroperitoneum and mesentery. Fatty liver. Enlarged spleen. Enlarged prostate. There is a large lytic lesion in the right iliac bone suggesting metastatic disease. Lumbar spondylosis. Other findings as described in the body of the report. Electronically Signed   By: Elmer Picker M.D.   On: 12/18/2021 11:50   DG Chest Portable 1 View  Result Date: 12/18/2021 CLINICAL DATA:  Chest pain. EXAM: PORTABLE CHEST 1 VIEW COMPARISON:  November 21, 2021.  November 07, 2021. FINDINGS: The heart size and mediastinal contours are within normal limits. Right lung is clear. Known left upper lobe  pulmonary nodule is again noted concerning for malignancy. The visualized skeletal structures are unremarkable. IMPRESSION: Known left upper lobe pulmonary nodule is again noted concerning for malignancy. No other abnormality is noted. Electronically Signed   By: Marijo Conception M.D.   On: 12/18/2021 08:54    Procedures Procedures  {Document cardiac monitor, telemetry assessment procedure when appropriate:1}  Medications Ordered in ED Medications  ondansetron (ZOFRAN) injection 4 mg (4 mg Intravenous Given 12/18/21 0940)  HYDROmorphone (DILAUDID) injection 0.5 mg (0.5 mg Intravenous Given 12/18/21 0940)  lactated ringers bolus 1,000 mL (0 mLs Intravenous Stopped 12/18/21 1134)  metoCLOPramide (REGLAN) injection 10 mg (10 mg Intravenous Given 12/18/21 1202)  iohexol (OMNIPAQUE) 300 MG/ML solution 100 mL (100 mLs Intravenous Contrast Given 12/18/21 1115)  ondansetron (ZOFRAN) injection 4 mg (4 mg Intravenous Given 12/18/21 1246)  HYDROmorphone (DILAUDID)  injection 0.5 mg (0.5 mg Intravenous Given 12/18/21 1438)    ED Course/ Medical Decision Making/ A&P                          Medical Decision Making Amount and/or Complexity of Data Reviewed Labs: ordered. Decision-making details documented in ED Course. Radiology: ordered. Decision-making details documented in ED Course.  Risk Prescription drug management. Decision regarding hospitalization.   Patient afebrile, HDS.  Chronically ill-appearing as well as acutely uncomfortable and nauseated.  Actively dry heaving.  Patient with nausea vomiting that he does not normally deal with over the course of his cancer treatment, is not currently receiving chemotherapy.  Consider electrolyte abnormalities, renal injury as a result of dehydration given dry mucous membranes.  For nausea vomiting consider enteritis/colitis, gastritis, possible ileus or SBO the patient is states he is continuing to pass gas.  Has no abdominal pain, no bloating. ***  I have  personally reviewed and interpreted all labs and imaging.   Clinical Course as of 12/18/21 1606  Tue Dec 18, 2021  1138 Lactic Acid, Venous: 1.5 [HN]  1138 Ketones, ur(!): 20 [HN]  1138 BUN(!): 26 [HN]  1138 Creatinine: 0.80 [HN]  1139 WBC(!!): 185.7 CLL, c/w prior values [HN]  67 CT ABDOMEN PELVIS W CONTRAST There is no evidence of intestinal obstruction or pneumoperitoneum. There is no hydronephrosis. Appendix is not dilated. There is prominence of mucosal folds in the stomach which may be due to incomplete distention or suggest gastritis. Diverticulosis of colon without signs of focal diverticulitis. There are slightly enlarged lymph nodes measuring up to 11 mm in short axis adjacent to body bodies, retroperitoneum and mesentery. Fatty liver. Enlarged spleen. Enlarged prostate. There is a large lytic lesion in the right iliac bone suggesting metastatic disease. Lumbar spondylosis. Other findings as described in the body of the report. [HN]  1240 Patient with negative CT scan for acute findings. Still feels nauseated after first dose of zofran and reglan. Will redose with zofran IV.  [HN]  1353 Lactic Acid, Venous: 0.9 [HN]  1353 Magnesium: 2.0 [HN]  1433 Patient reevaluated. With worsening pain in R leg patient associates w/ bone met and nausea still severe. Will consult to hospitalist for intractable nausea and vomiting. [HN]    Clinical Course User Index [HN] Audley Hose, MD     {Document critical care time when appropriate:1} {Document review of labs and clinical decision tools ie heart score, Chads2Vasc2 etc:1}  {Document your independent review of radiology images, and any outside records:1} {Document your discussion with family members, caretakers, and with consultants:1} {Document social determinants of health affecting pt's care:1} {Document your decision making why or why not admission, treatments were needed:1} Final Clinical Impression(s) / ED Diagnoses Final  diagnoses:  None    Rx / DC Orders ED Discharge Orders     None        This note was created using dictation software, which may contain spelling or grammatical errors.

## 2021-12-18 NOTE — Assessment & Plan Note (Signed)
-   Resume home lovastatin if tolerated

## 2021-12-18 NOTE — Assessment & Plan Note (Addendum)
-  Dilaudid  IV and oral pain management as tolerated in addition to topical lidocaine patch -start acetaminophen ATC -d/c home with his prior dose of oxycodone 10 mg q 4 prn pain

## 2021-12-18 NOTE — Assessment & Plan Note (Signed)
-  offer nicotine patch while in hospital and counseled on cessation

## 2021-12-18 NOTE — Assessment & Plan Note (Signed)
-   Resume home twice daily metoprolol at lower dose

## 2021-12-18 NOTE — Assessment & Plan Note (Signed)
-   Followed by Dr. Melvyn Novas as his primary pulmonologist -Resume home bronchodilators -stable on RA

## 2021-12-18 NOTE — Assessment & Plan Note (Signed)
-  From oncology notes, reportedly secondary to CLL

## 2021-12-18 NOTE — Assessment & Plan Note (Signed)
-   IV Protonix for GI protection

## 2021-12-18 NOTE — Assessment & Plan Note (Signed)
-   Diagnosed in 2010, currently on surveillance, followed by Dr. Delton Coombes

## 2021-12-19 DIAGNOSIS — Z720 Tobacco use: Secondary | ICD-10-CM

## 2021-12-19 DIAGNOSIS — R1110 Vomiting, unspecified: Secondary | ICD-10-CM | POA: Diagnosis present

## 2021-12-19 DIAGNOSIS — K219 Gastro-esophageal reflux disease without esophagitis: Secondary | ICD-10-CM | POA: Diagnosis present

## 2021-12-19 DIAGNOSIS — R112 Nausea with vomiting, unspecified: Secondary | ICD-10-CM | POA: Diagnosis not present

## 2021-12-19 DIAGNOSIS — D801 Nonfamilial hypogammaglobulinemia: Secondary | ICD-10-CM | POA: Diagnosis not present

## 2021-12-19 DIAGNOSIS — Z8616 Personal history of COVID-19: Secondary | ICD-10-CM | POA: Diagnosis not present

## 2021-12-19 DIAGNOSIS — F17210 Nicotine dependence, cigarettes, uncomplicated: Secondary | ICD-10-CM | POA: Diagnosis present

## 2021-12-19 DIAGNOSIS — R918 Other nonspecific abnormal finding of lung field: Secondary | ICD-10-CM

## 2021-12-19 DIAGNOSIS — Z823 Family history of stroke: Secondary | ICD-10-CM | POA: Diagnosis not present

## 2021-12-19 DIAGNOSIS — K2960 Other gastritis without bleeding: Secondary | ICD-10-CM | POA: Diagnosis present

## 2021-12-19 DIAGNOSIS — E785 Hyperlipidemia, unspecified: Secondary | ICD-10-CM | POA: Diagnosis present

## 2021-12-19 DIAGNOSIS — G893 Neoplasm related pain (acute) (chronic): Secondary | ICD-10-CM | POA: Diagnosis not present

## 2021-12-19 DIAGNOSIS — C3412 Malignant neoplasm of upper lobe, left bronchus or lung: Secondary | ICD-10-CM | POA: Diagnosis present

## 2021-12-19 DIAGNOSIS — C7951 Secondary malignant neoplasm of bone: Secondary | ICD-10-CM | POA: Diagnosis present

## 2021-12-19 DIAGNOSIS — I452 Bifascicular block: Secondary | ICD-10-CM | POA: Diagnosis present

## 2021-12-19 DIAGNOSIS — F32A Depression, unspecified: Secondary | ICD-10-CM | POA: Diagnosis present

## 2021-12-19 DIAGNOSIS — F1320 Sedative, hypnotic or anxiolytic dependence, uncomplicated: Secondary | ICD-10-CM | POA: Diagnosis present

## 2021-12-19 DIAGNOSIS — T426X5A Adverse effect of other antiepileptic and sedative-hypnotic drugs, initial encounter: Secondary | ICD-10-CM | POA: Diagnosis present

## 2021-12-19 DIAGNOSIS — J449 Chronic obstructive pulmonary disease, unspecified: Secondary | ICD-10-CM | POA: Diagnosis not present

## 2021-12-19 DIAGNOSIS — C9110 Chronic lymphocytic leukemia of B-cell type not having achieved remission: Secondary | ICD-10-CM | POA: Diagnosis not present

## 2021-12-19 DIAGNOSIS — R627 Adult failure to thrive: Secondary | ICD-10-CM | POA: Diagnosis present

## 2021-12-19 DIAGNOSIS — F411 Generalized anxiety disorder: Secondary | ICD-10-CM | POA: Diagnosis present

## 2021-12-19 DIAGNOSIS — Z801 Family history of malignant neoplasm of trachea, bronchus and lung: Secondary | ICD-10-CM | POA: Diagnosis not present

## 2021-12-19 DIAGNOSIS — Z800 Family history of malignant neoplasm of digestive organs: Secondary | ICD-10-CM | POA: Diagnosis not present

## 2021-12-19 DIAGNOSIS — Z8249 Family history of ischemic heart disease and other diseases of the circulatory system: Secondary | ICD-10-CM | POA: Diagnosis not present

## 2021-12-19 DIAGNOSIS — Z923 Personal history of irradiation: Secondary | ICD-10-CM | POA: Diagnosis not present

## 2021-12-19 DIAGNOSIS — Z7982 Long term (current) use of aspirin: Secondary | ICD-10-CM | POA: Diagnosis not present

## 2021-12-19 DIAGNOSIS — Z79899 Other long term (current) drug therapy: Secondary | ICD-10-CM | POA: Diagnosis not present

## 2021-12-19 LAB — BASIC METABOLIC PANEL
Anion gap: 8 (ref 5–15)
BUN: 20 mg/dL (ref 8–23)
CO2: 24 mmol/L (ref 22–32)
Calcium: 8.9 mg/dL (ref 8.9–10.3)
Chloride: 103 mmol/L (ref 98–111)
Creatinine, Ser: 0.76 mg/dL (ref 0.61–1.24)
GFR, Estimated: >60 mL/min (ref >60)
Glucose, Bld: 121 mg/dL — ABNORMAL HIGH (ref 70–99)
Potassium: 4 mmol/L (ref 3.5–5.1)
Sodium: 135 mmol/L (ref 135–145)

## 2021-12-19 LAB — MAGNESIUM: Magnesium: 2 mg/dL (ref 1.7–2.4)

## 2021-12-19 LAB — PHOSPHORUS: Phosphorus: 3.8 mg/dL (ref 2.5–4.6)

## 2021-12-19 MED ORDER — ACETAMINOPHEN 500 MG PO TABS
1000.0000 mg | ORAL_TABLET | Freq: Four times a day (QID) | ORAL | Status: DC
  Administered 2021-12-19 – 2021-12-20 (×5): 1000 mg via ORAL
  Filled 2021-12-19 (×5): qty 2

## 2021-12-19 MED ORDER — ONDANSETRON HCL 4 MG/2ML IJ SOLN
4.0000 mg | Freq: Four times a day (QID) | INTRAMUSCULAR | Status: DC
  Administered 2021-12-19 – 2021-12-20 (×4): 4 mg via INTRAVENOUS
  Filled 2021-12-19 (×3): qty 2

## 2021-12-19 MED ORDER — ENSURE ENLIVE PO LIQD
237.0000 mL | Freq: Two times a day (BID) | ORAL | Status: DC
  Administered 2021-12-19 – 2021-12-20 (×4): 237 mL via ORAL

## 2021-12-19 MED ORDER — METOPROLOL TARTRATE 25 MG PO TABS
25.0000 mg | ORAL_TABLET | Freq: Two times a day (BID) | ORAL | Status: DC
  Administered 2021-12-19 – 2021-12-20 (×2): 25 mg via ORAL
  Filled 2021-12-19 (×2): qty 1

## 2021-12-19 NOTE — TOC Progression Note (Signed)
  Transition of Care [facility]) Screening Note   Patient Details  Name: [place] Date of Birth: May 05, 1950   Transition of Care [facility]) CM/SW Contact:    Shade Flood, LCSW Phone Number: 12/19/2021, 11:42 AM    Transition of Care Department [facility]) has reviewed patient and no TOC needs have been identified at this time. We will continue to monitor patient advancement through interdisciplinary progression rounds. If new patient transition needs arise, please place a TOC consult.

## 2021-12-19 NOTE — Progress Notes (Signed)
PROGRESS NOTE  [facility] OMV:[id number] DOB: 1950-06-03 DOA: 12/18/2021 PCP: Redmond School, MD  Brief History:  71 year old gentleman with longstanding history of CLL diagnosed in 2010 and now on surveillance, hypogammaglobulinemia, COPD, depression anxiety, history of COVID infection, current smoker smoker 45-pack-year, newly discovered left upper lobe lung mass determined to be metastatic to right iliac bone and right second and fourth ribs.  He was recently started on radiation treatments.  He has been suffering with difficult to control cancer pain.  He reports that he has been having nausea and vomiting for the past several days.  He has overall malaise.  He has some chest pain symptoms.  He has been dry heaving and he has been having difficulty keeping fluids down and keeping his medications down.  Of note, he was recently started on gabapentin to assist with uncontrolled cancer pain after having had a bone lesion biopsy on 12/06/2021.  He was treated in the ED with pain and nausea medication and IV fluids; however his symptoms remain uncontrolled and admission was requested for ongoing management.    Assessment and Plan: * Intractable nausea and vomiting -secondary to gastritis, opioids - emesis started after XRT, but pt had FTT s/s prior to XRT - Possibly related to recently starting gabapentin which we will hold at this time - Continue supportive measures with IV fluids and IV nausea medications   Generalized anxiety disorder - Benzodiazepine dependent, resume alprazolam as needed and daily wellbutrin   Cancer associated pain -Dilaudid  IV and oral pain management as tolerated in addition to topical lidocaine patch -start acetaminophen ATC  Leukocytosis - Secondary to CLL -no fever, remains hemodynamically stable  Rib pain on right side - Secondary to metastatic cancer -opioids as discussed above  Mass of left lung - Metastatic non-small cell lung  cancer -had first XRT on 12/17/21 to ribs, R-iliac  Hypogammaglobulinemia ([place]) -From oncology notes, reportedly secondary to CLL  COPD GOLD 0 - Followed by Dr. Melvyn Novas as his primary pulmonologist -Resume home bronchodilators -stable on RA  Tobacco abuse -offer nicotine patch while in hospital and counseled on cessation  Chronic lymphocytic leukemia ([place]) - Diagnosed in 2010, currently on surveillance, followed by Dr. Delton Coombes  Palpitations - Resume home twice daily metoprolol at lower dose  Hyperlipidemia - Resume home lovastatin if tolerated  GERD (gastroesophageal reflux disease) - IV Protonix     Family Communication:   spouse updated at bedside 9/20  Consultants:  none  Code Status:  FULL   DVT Prophylaxis:  Roann Lovenox   Procedures: As Listed in Progress Note Above  Antibiotics: None      Subjective: He continues to have some nausea but no emesis overnight.  He denies any fevers, chills, chest pain, shortness of breath, abdominal pain, diarrhea, hematochezia, melena, dysuria, hematuria.  Objective: Vitals:   12/18/21 1648 12/18/21 2052 12/19/21 0503 12/19/21 0822  BP: 135/85 136/72 120/83 (!) 144/77  Pulse: 88 85 81 81  Resp: 20 18 18    Temp: 98.1 F (36.7 C) 98.3 F (36.8 C) 97.9 F (36.6 C)   TempSrc: Oral Oral    SpO2: 98% 98% 95%   Weight:      Height:        Intake/Output Summary (Last 24 hours) at 12/19/2021 0835 Last data filed at 12/18/2021 1600 Gross per 24 hour  Intake 35 ml  Output --  Net 35 ml   Weight change:  Exam:  General:  Pt is alert, follows commands appropriately, not in acute distress HEENT: No icterus, No thrush, No neck mass, [place]/AT Cardiovascular: RRR, S1/S2, no rubs, no gallops Respiratory:diminsihed BS.  Bibasilar rales. No wheeze Abdomen: Soft/+BS, non tender, non distended, no guarding Extremities: No edema, No lymphangitis, No petechiae, No rashes, no synovitis   Data Reviewed: I have personally  reviewed following labs and imaging studies Basic Metabolic Panel: Recent Labs  Lab 12/18/21 0937 12/18/21 1542 12/19/21 0451  NA 136  --  135  K 3.7  --  4.0  CL 101  --  103  CO2 24  --  24  GLUCOSE 123*  --  121*  BUN 26*  --  20  CREATININE 0.80 0.77 0.76  CALCIUM 9.2  --  8.9  MG 2.0  --  2.0  PHOS  --   --  3.8   Liver Function Tests: Recent Labs  Lab 12/18/21 0937  AST 15  ALT 19  ALKPHOS 96  BILITOT 1.0  PROT 6.3*  ALBUMIN 3.5   No results for input(s): "LIPASE", "AMYLASE" in the last 168 hours. No results for input(s): "AMMONIA" in the last 168 hours. Coagulation Profile: No results for input(s): "INR", "PROTIME" in the last 168 hours. CBC: Recent Labs  Lab 12/18/21 0937 12/18/21 1542  WBC 185.7* 174.9*  NEUTROABS 11.1*  --   HGB 13.1 13.2  HCT 42.2 41.6  MCV 91.1 90.0  PLT 270 258   Cardiac Enzymes: No results for input(s): "CKTOTAL", "CKMB", "CKMBINDEX", "TROPONINI" in the last 168 hours. BNP: Invalid input(s): "POCBNP" CBG: Recent Labs  Lab 12/18/21 0948  GLUCAP 122*   HbA1C: No results for input(s): "HGBA1C" in the last 72 hours. Urine analysis:    Component Value Date/Time   COLORURINE YELLOW 12/18/2021 0833   APPEARANCEUR CLEAR 12/18/2021 0833   APPEARANCEUR Clear 11/24/2019 1047   LABSPEC 1.020 12/18/2021 0833   PHURINE 6.0 12/18/2021 0833   GLUCOSEU NEGATIVE 12/18/2021 0833   HGBUR MODERATE (A) 12/18/2021 0833   BILIRUBINUR NEGATIVE 12/18/2021 0833   BILIRUBINUR Negative 11/24/2019 1047   KETONESUR 20 (A) 12/18/2021 0833   PROTEINUR NEGATIVE 12/18/2021 0833   NITRITE NEGATIVE 12/18/2021 0833   LEUKOCYTESUR NEGATIVE 12/18/2021 0833   Sepsis Labs: @LABRCNTIP (procalcitonin:4,lacticidven:4) ) Recent Results (from the past 240 hour(s))  SARS Coronavirus 2 by RT PCR (hospital order, performed in [facility] hospital lab) *cepheid single result test* Anterior Nasal Swab     Status: None   Collection Time: 12/18/21  2:35 PM    Specimen: Anterior Nasal Swab  Result Value Ref Range Status   SARS Coronavirus 2 by RT PCR NEGATIVE NEGATIVE Final    Comment: (NOTE) SARS-CoV-2 target nucleic acids are NOT DETECTED.  The SARS-CoV-2 RNA is generally detectable in upper and lower respiratory specimens during the acute phase of infection. The lowest concentration of SARS-CoV-2 viral copies this assay can detect is 250 copies / mL. A negative result does not preclude SARS-CoV-2 infection and should not be used as the sole basis for treatment or other patient management decisions.  A negative result may occur with improper specimen collection / handling, submission of specimen other than nasopharyngeal swab, presence of viral mutation(s) within the areas targeted by this assay, and inadequate number of viral copies (<250 copies / mL). A negative result must be combined with clinical observations, patient history, and epidemiological information.  Fact Sheet for Patients:   https://www.patel.info/  Fact Sheet for Healthcare Providers: https://hall.com/  This test is  not yet approved or  cleared by the [place] and has been authorized for detection and/or diagnosis of SARS-CoV-2 by FDA under an Emergency Use Authorization (EUA).  This EUA will remain in effect (meaning this test can be used) for the duration of the COVID-19 declaration under Section 564(b)(1) of the Act, 21 U.S.C. section 360bbb-3(b)(1), unless the authorization is terminated or revoked sooner.  Performed at [facility], [address], [place], [place] [address]      Scheduled Meds:  acetaminophen  1,000 mg Oral Q6H   buPROPion  150 mg Oral Daily   dexamethasone  8 mg Oral BID   enoxaparin (LOVENOX) injection  40 mg Subcutaneous Q24H   lidocaine  1 patch Transdermal Q24H   metoprolol tartrate  25 mg Oral BID   ondansetron (ZOFRAN) IV  4 mg Intravenous Q6H   pantoprazole (PROTONIX) IV  40 mg  Intravenous Q12H   pravastatin  20 mg Oral q1800   Continuous Infusions:  lactated ringers 100 mL/hr at 12/19/21 0350    Procedures/Studies: CT ABDOMEN PELVIS W CONTRAST  Result Date: 12/18/2021 CLINICAL DATA:  Nausea, vomiting EXAM: CT ABDOMEN AND PELVIS WITH CONTRAST TECHNIQUE: Multidetector CT imaging of the abdomen and pelvis was performed using the standard protocol following bolus administration of intravenous contrast. RADIATION DOSE REDUCTION: This exam was performed according to the departmental dose-optimization program which includes automated exposure control, adjustment of the mA and/or kV according to patient size and/or use of iterative reconstruction technique. CONTRAST:  145mL OMNIPAQUE IOHEXOL 300 MG/ML  SOLN COMPARISON:  CT done on 12/07/2015, PET-CT done on 11/22/2021 FINDINGS: Lower chest: Visualized lower lung fields are unremarkable. There are scattered coronary artery calcifications. Hepatobiliary: There is fatty infiltration in liver. No focal abnormalities are seen. Gallbladder is unremarkable. Pancreas: No focal abnormalities are seen. Spleen: Spleen measures 15.1 cm in length. Adrenals/Urinary Tract: Adrenals are unremarkable. There is no hydronephrosis. There are no renal or ureteral stones. There are a few subcentimeter low-density foci in renal cortex, possibly cysts. Urinary bladder is not distended. Stomach/Bowel: There is prominence of mucosal folds in stomach. There is no significant distention of stomach. There is no significant small bowel dilation. Appendix is not dilated. Multiple diverticula are seen in colon. There is no evidence of focal acute diverticulitis. Vascular/Lymphatic: Calcifications are seen in abdominal aorta and its major branches. There are slightly enlarged lymph nodes in mesentery and retroperitoneum. Largest of the nodes is noted close to porta bodies measuring 11 mm in short axis. Reproductive: Prostate is enlarged with inhomogeneous attenuation.  Other: There is no ascites or pneumoperitoneum. Musculoskeletal: There is a large lytic lesion measuring up to 6 cm in diameter in right iliac bone. Spondylolysis is seen in L4 vertebra. There is first-degree spondylolisthesis at the L4-L5 level. There is encroachment of neural foramina from L3-S1 levels. IMPRESSION: There is no evidence of intestinal obstruction or pneumoperitoneum. There is no hydronephrosis. Appendix is not dilated. There is prominence of mucosal folds in the stomach which may be due to incomplete distention or suggest gastritis. Diverticulosis of colon without signs of focal diverticulitis. There are slightly enlarged lymph nodes measuring up to 11 mm in short axis adjacent to body bodies, retroperitoneum and mesentery. Fatty liver. Enlarged spleen. Enlarged prostate. There is a large lytic lesion in the right iliac bone suggesting metastatic disease. Lumbar spondylosis. Other findings as described in the body of the report. Electronically Signed   By: Elmer Picker M.D.   On: 12/18/2021 11:50  DG Chest Portable 1 View  Result Date: 12/18/2021 CLINICAL DATA:  Chest pain. EXAM: PORTABLE CHEST 1 VIEW COMPARISON:  November 21, 2021.  November 07, 2021. FINDINGS: The heart size and mediastinal contours are within normal limits. Right lung is clear. Known left upper lobe pulmonary nodule is again noted concerning for malignancy. The visualized skeletal structures are unremarkable. IMPRESSION: Known left upper lobe pulmonary nodule is again noted concerning for malignancy. No other abnormality is noted. Electronically Signed   By: Marijo Conception M.D.   On: 12/18/2021 08:54   CT BONE TROCAR/NEEDLE BIOPSY DEEP  Result Date: 12/06/2021 INDICATION: 71 year old male sense for biopsy lytic bone lesion of the pelvis, with a history small cell lung cancer and CLL EXAM: CT BIOPSY CORE BONE DEEP MEDICATIONS: None. ANESTHESIA/SEDATION: Moderate (conscious) sedation was employed during this procedure. A  total of Versed 2.0 mg and Fentanyl 100 mcg was administered intravenously. Moderate Sedation Time: 10 minutes. The patient's level of consciousness and vital signs were monitored continuously by radiology nursing throughout the procedure under my direct supervision. FLUOROSCOPY TIME:  CT COMPLICATIONS: None PROCEDURE: Informed written consent was obtained from the patient after a thorough discussion of the procedural risks, benefits and alternatives. All questions were addressed. Maximal Sterile Barrier Technique was utilized including caps, mask, sterile gowns, sterile gloves, sterile drape, hand hygiene and skin antiseptic. A timeout was performed prior to the initiation of the procedure. Patient was positioned left decubitus on the CT gantry table. Scout CT was performed for planning purposes. The patient is prepped and draped in the usual sterile fashion. 1% lidocaine was used for local anesthesia. Using CT guidance, guide needle was advanced into the lytic lesion of the right iliac bone. Once we confirmed needle tip position multiple 18 go urge core biopsy were performed. The needle was removed and repositioned with repeat imaging performed. Repeat biopsy performed with multiple 18 gauge core biopsy. Needle was removed and a sterile dressing was placed. Patient tolerated the procedure well and remained hemodynamically stable throughout. No complications were encountered and no significant blood loss. IMPRESSION: Status post CT-guided biopsy of right iliac bone lytic lesion. Signed, Dulcy Fanny. Nadene Rubins, RPVI [facility] Electronically Signed   By: Corrie Mckusick D.O.   On: 12/06/2021 10:01   NM PET Image Initial (PI) Skull Base To Thigh  Result Date: 11/24/2021 CLINICAL DATA:  Initial treatment strategy for non-small cell lung cancer. History of CLL EXAM: NUCLEAR MEDICINE PET SKULL BASE TO THIGH TECHNIQUE: 8.94 mCi F-18 FDG was injected  intravenously. Full-ring PET imaging was performed from the skull base to thigh after the radiotracer. CT data was obtained and used for attenuation correction and anatomic localization. Fasting blood glucose: 87 mg/dl COMPARISON:  Chest CT 11/07/2021 FINDINGS: Mediastinal blood pool activity: SUV max 2.14 Liver activity: SUV max NA NECK: No hypermetabolic lymph nodes in the neck. Incidental CT findings: Bilateral carotid artery calcifications. CHEST: The left upper lobe/left hilar mass is hypermetabolic with SUV max of 51.76. Adjacent hypermetabolic left hilar node has an SUV max of 7.60. Hypermetabolic pre-vascular and AP window nodes are also noted. Larger subcarinal lymph node measures 22 mm and has an SUV max of 14.88. The curvilinear nodular density in the left upper lobe adjacent to the major fissure has an SUV max of 3.08. The more linear left upper lobe density has an SUV max of 3.59. Indeterminate findings but possible endobronchial spread of tumor. No definite metastatic pulmonary nodules. No  enlarged or hypermetabolic supraclavicular or axillary nodes. Incidental CT findings: Stable age advanced aortic and coronary artery calcifications. Stable underlying emphysematous changes and pulmonary scarring. ABDOMEN/PELVIS: No abnormal hypermetabolic activity within the liver, pancreas, adrenal glands, or spleen. No hypermetabolic lymph nodes in the abdomen or pelvis. Incidental CT findings: Mild splenomegaly noted. The age advanced atherosclerotic calcification involving the aorta and iliac arteries but no aneurysm. Sigmoid colon diverticulosis. SKELETON: Osseous metastatic disease is demonstrated with a large lytic destructive bone lesion involving the right iliac bone with SUV max of 16.09. There are also destructive bone lesions involving the right second and fourth ribs. The fourth anterior rib lesion is larger and quite destructive. Associated surrounding soft tissue component and SUV max is 14.61. Small  lesion involving the right posterior aspect of T5. No canal involvement. SUV max is 6.61. Incidental CT findings: None. IMPRESSION: 1. Large central left upper lobe/left suprahilar lung mass is hypermetabolic and consistent with known non-small cell lung cancer. Associated hilar and mediastinal lymphadenopathy. 2. No findings for abdominal/pelvic metastatic disease or metastatic pulmonary nodules. 3. Metastatic bone disease with aggressive destructive bone lesions as detailed above. Electronically Signed   By: Marijo Sanes M.D.   On: 11/24/2021 09:34   MR Brain W Wo Contrast  Result Date: 11/23/2021 CLINICAL DATA:  Non-small-cell lung cancer staging EXAM: MRI HEAD WITHOUT AND WITH CONTRAST TECHNIQUE: Multiplanar, multiecho pulse sequences of the brain and surrounding structures were obtained without and with intravenous contrast. CONTRAST:  90mL GADAVIST GADOBUTROL 1 MMOL/ML IV SOLN COMPARISON:  MR head 01/13/2013 FINDINGS: Brain: There is no acute intracranial hemorrhage, extra-axial fluid collection, or acute infarct. Background parenchymal volume is normal. The ventricles are normal in size. Gray-white differentiation is preserved. There are scattered small foci of FLAIR signal abnormality in the supratentorial white matter likely reflecting sequela of mild chronic white matter microangiopathy. There is no suspicious parenchymal signal abnormality. There is no abnormal enhancement. There is no mass lesion there is no mass effect or midline shift. Vascular: Normal flow voids. Skull and upper cervical spine: Normal marrow signal. Sinuses/Orbits: The paranasal sinuses are clear. The globes and orbits are unremarkable. Other: None. IMPRESSION: No evidence of intracranial metastatic disease. Electronically Signed   By: Valetta Mole M.D.   On: 11/23/2021 16:22   DG Chest 2 View  Result Date: 11/21/2021 CLINICAL DATA:  Increased right-sided chest discomfort. Cough and shortness of breath. COPD exacerbation.  EXAM: CHEST - 2 VIEW COMPARISON:  Chest CT 11/07/2021. Chest and rib radiographs 10/22/2021 FINDINGS: Increased bronchial thickening from prior exam. The heart is normal in size. The left upper lobe nodules on CT are not well seen by radiograph. No pleural effusion or pneumothorax. Stable osseous structures. IMPRESSION: 1. Increased bronchial thickening from prior exam, may represent acute bronchitis/COPD exacerbation. 2. The known left upper lobe nodule on recent CT is not well seen by radiograph. Electronically Signed   By: Keith Rake M.D.   On: 11/21/2021 17:29    Orson Eva, DO  [facility]  If 7PM-7AM, please contact night-coverage [email or web address] Password [facility] 12/19/2021, 8:35 AM   LOS: 0 days

## 2021-12-20 DIAGNOSIS — C9110 Chronic lymphocytic leukemia of B-cell type not having achieved remission: Secondary | ICD-10-CM | POA: Diagnosis not present

## 2021-12-20 DIAGNOSIS — J449 Chronic obstructive pulmonary disease, unspecified: Secondary | ICD-10-CM | POA: Diagnosis not present

## 2021-12-20 DIAGNOSIS — R112 Nausea with vomiting, unspecified: Secondary | ICD-10-CM | POA: Diagnosis not present

## 2021-12-20 DIAGNOSIS — G893 Neoplasm related pain (acute) (chronic): Secondary | ICD-10-CM | POA: Diagnosis not present

## 2021-12-20 LAB — BASIC METABOLIC PANEL
Anion gap: 6 (ref 5–15)
BUN: 22 mg/dL (ref 8–23)
CO2: 23 mmol/L (ref 22–32)
Calcium: 8.7 mg/dL — ABNORMAL LOW (ref 8.9–10.3)
Chloride: 104 mmol/L (ref 98–111)
Creatinine, Ser: 0.79 mg/dL (ref 0.61–1.24)
GFR, Estimated: >60 mL/min (ref >60)
Glucose, Bld: 125 mg/dL — ABNORMAL HIGH (ref 70–99)
Potassium: 3.8 mmol/L (ref 3.5–5.1)
Sodium: 133 mmol/L — ABNORMAL LOW (ref 135–145)

## 2021-12-20 LAB — MAGNESIUM: Magnesium: 2 mg/dL (ref 1.7–2.4)

## 2021-12-20 MED ORDER — METOPROLOL TARTRATE 25 MG PO TABS: 25.0000 mg | ORAL_TABLET | Freq: Two times a day (BID) | ORAL | 1 refills | Status: DC

## 2021-12-20 MED ORDER — OXYCODONE HCL 10 MG PO TABS: 10.0000 mg | ORAL_TABLET | ORAL | 0 refills | Status: DC | PRN

## 2021-12-20 MED ORDER — PROCHLORPERAZINE MALEATE 5 MG PO TABS: 5.0000 mg | ORAL_TABLET | Freq: Four times a day (QID) | ORAL | 0 refills | Status: DC | PRN

## 2021-12-20 MED ORDER — PROCHLORPERAZINE MALEATE 5 MG PO TABS: 5.0000 mg | ORAL_TABLET | Freq: Four times a day (QID) | ORAL | Status: DC | PRN

## 2021-12-20 MED ORDER — PROCHLORPERAZINE EDISYLATE 10 MG/2ML IJ SOLN: 5.0000 mg | INTRAMUSCULAR | Status: DC | PRN

## 2021-12-20 NOTE — Discharge Summary (Signed)
Physician Discharge Summary   Patient: Alfred Brown MRN: [id number] DOB: [birth year]  Admit date:     12/18/2021  Discharge date: 12/20/21  Discharge Physician: Shanon Brow Batoul Limes   PCP: Redmond School, MD   Recommendations at discharge:   Please follow up with primary care provider within 1-2 weeks  Please repeat BMP and CBC in one week    Hospital Course: 71 year old gentleman with longstanding history of CLL diagnosed in 2010 and now on surveillance, hypogammaglobulinemia, COPD, depression anxiety, history of COVID infection, current smoker smoker 45-pack-year, newly discovered left upper lobe lung mass determined to be metastatic to right iliac bone and right second and fourth ribs.  He was recently started on radiation treatments.  He has been suffering with difficult to control cancer pain.  He reports that he has been having nausea and vomiting for the past several days.  He has overall malaise.  He has some chest pain symptoms.  He has been dry heaving and he has been having difficulty keeping fluids down and keeping his medications down.  Of note, he was recently started on gabapentin to assist with uncontrolled cancer pain after having had a bone lesion biopsy on 12/06/2021.  He was treated in the ED with pain and nausea medication and IV fluids; however his symptoms remain uncontrolled and admission was requested for ongoing management.  Assessment and Plan: * Intractable nausea and vomiting -secondary to gastritis, opioids - emesis started after XRT, but pt had FTT s/s prior to XRT - Possibly related to recently starting gabapentin which we will hold at this time - Continue supportive measures with IV fluids and IV nausea medications - over 24 hour period, pt clinically improved and his diet was advanced which he tolerated   Generalized anxiety disorder - Benzodiazepine dependent, resume alprazolam as needed and daily wellbutrin   Cancer associated pain -Dilaudid  IV and oral pain  management as tolerated in addition to topical lidocaine patch -start acetaminophen ATC -d/c home with his prior dose of oxycodone 10 mg q 4 prn pain  Leukocytosis - Secondary to CLL -no fever, remains hemodynamically stable  Rib pain on right side - Secondary to metastatic cancer -opioids as discussed above  Mass of left lung - Metastatic non-small cell lung cancer -had first XRT on 12/17/21 to ribs, R-iliac  Hypogammaglobulinemia ([place]) -From oncology notes, reportedly secondary to CLL  COPD GOLD 0 - Followed by Dr. Melvyn Novas as his primary pulmonologist -Resume home bronchodilators -stable on RA  Tobacco abuse -offer nicotine patch while in hospital and counseled on cessation  Chronic lymphocytic leukemia ([place]) - Diagnosed in 2010, currently on surveillance, followed by Dr. Delton Coombes  Palpitations - Resume home twice daily metoprolol at lower dose  Hyperlipidemia - Resume home lovastatin if tolerated  GERD (gastroesophageal reflux disease) - IV Protonix          Consultants: none Procedures performed: none  Disposition: Home Diet recommendation:  Regular diet DISCHARGE MEDICATION: Allergies as of 12/20/2021   No Known Allergies      Medication List     STOP taking these medications    gabapentin 300 MG capsule Commonly known as: Neurontin       TAKE these medications    albuterol 108 (90 Base) MCG/ACT inhaler Commonly known as: VENTOLIN HFA 2 puffs every 4 hours as needed if you can't catch your breath   ALPRAZolam 1 MG tablet Commonly known as: XANAX Take 1 mg by mouth 3 (three) times daily as needed for anxiety.  aspirin 325 MG tablet Take 325 mg by mouth daily.   buPROPion 150 MG 12 hr tablet Commonly known as: WELLBUTRIN SR Take 150 mg by mouth daily.   calcium carbonate 600 MG Tabs tablet Commonly known as: OS-CAL Take 600 mg by mouth daily.   celecoxib 100 MG capsule Commonly known as: CELEBREX Take 100 mg by mouth 2 (two)  times daily.   clotrimazole-betamethasone cream Commonly known as: LOTRISONE Apply 1 application topically daily as needed. Affected area   dexamethasone 4 MG tablet Commonly known as: DECADRON Take 2 tablets by mouth 2 (two) times daily.   diltiazem 30 MG tablet Commonly known as: CARDIZEM Take 30 mg by mouth as needed.   fish oil-omega-3 fatty acids 1000 MG capsule Take 1 g by mouth daily.   fluticasone 50 MCG/ACT nasal spray Commonly known as: FLONASE Place 2 sprays into the nose daily.   lidocaine 5 % Commonly known as: Lidoderm Place 1 patch onto the skin daily. Remove and Discard patch within 12 hours.   Loratadine 10 MG Caps Take 10 mg by mouth daily.   lovastatin 20 MG tablet Commonly known as: MEVACOR Take 20 mg by mouth daily.   metoprolol tartrate 25 MG tablet Commonly known as: LOPRESSOR Take 1 tablet (25 mg total) by mouth 2 (two) times daily. What changed:  medication strength how much to take   multivitamin tablet Take 1 tablet by mouth daily.   niacin 500 MG ER tablet Commonly known as: VITAMIN B3 Take 500 mg by mouth daily.   omeprazole 20 MG capsule Commonly known as: PRILOSEC Take 20 mg by mouth daily.   Oxycodone HCl 10 MG Tabs Take 1 tablet (10 mg total) by mouth every 4 (four) hours as needed.   prochlorperazine 5 MG tablet Commonly known as: COMPAZINE Take 1 tablet (5 mg total) by mouth every 6 (six) hours as needed for nausea or vomiting.        Discharge Exam: Filed Weights   12/18/21 0814  Weight: 75.8 kg   HEENT:  [place]/AT, No thrush, no icterus CV:  RRR, no rub, no S3, no S4 Lung:  CTA, no wheeze, no rhonchi Abd:  soft/+BS, NT Ext:  No edema, no lymphangitis, no synovitis, no rash\  Condition at discharge: stable  The results of significant diagnostics from this hospitalization (including imaging, microbiology, ancillary and laboratory) are listed below for reference.   Imaging Studies: CT ABDOMEN PELVIS W  CONTRAST  Result Date: 12/18/2021 CLINICAL DATA:  Nausea, vomiting EXAM: CT ABDOMEN AND PELVIS WITH CONTRAST TECHNIQUE: Multidetector CT imaging of the abdomen and pelvis was performed using the standard protocol following bolus administration of intravenous contrast. RADIATION DOSE REDUCTION: This exam was performed according to the departmental dose-optimization program which includes automated exposure control, adjustment of the mA and/or kV according to patient size and/or use of iterative reconstruction technique. CONTRAST:  13mL OMNIPAQUE IOHEXOL 300 MG/ML  SOLN COMPARISON:  CT done on 12/07/2015, PET-CT done on 11/22/2021 FINDINGS: Lower chest: Visualized lower lung fields are unremarkable. There are scattered coronary artery calcifications. Hepatobiliary: There is fatty infiltration in liver. No focal abnormalities are seen. Gallbladder is unremarkable. Pancreas: No focal abnormalities are seen. Spleen: Spleen measures 15.1 cm in length. Adrenals/Urinary Tract: Adrenals are unremarkable. There is no hydronephrosis. There are no renal or ureteral stones. There are a few subcentimeter low-density foci in renal cortex, possibly cysts. Urinary bladder is not distended. Stomach/Bowel: There is prominence of mucosal folds in stomach. There is no  significant distention of stomach. There is no significant small bowel dilation. Appendix is not dilated. Multiple diverticula are seen in colon. There is no evidence of focal acute diverticulitis. Vascular/Lymphatic: Calcifications are seen in abdominal aorta and its major branches. There are slightly enlarged lymph nodes in mesentery and retroperitoneum. Largest of the nodes is noted close to porta bodies measuring 11 mm in short axis. Reproductive: Prostate is enlarged with inhomogeneous attenuation. Other: There is no ascites or pneumoperitoneum. Musculoskeletal: There is a large lytic lesion measuring up to 6 cm in diameter in right iliac bone. Spondylolysis is  seen in L4 vertebra. There is first-degree spondylolisthesis at the L4-L5 level. There is encroachment of neural foramina from L3-S1 levels. IMPRESSION: There is no evidence of intestinal obstruction or pneumoperitoneum. There is no hydronephrosis. Appendix is not dilated. There is prominence of mucosal folds in the stomach which may be due to incomplete distention or suggest gastritis. Diverticulosis of colon without signs of focal diverticulitis. There are slightly enlarged lymph nodes measuring up to 11 mm in short axis adjacent to body bodies, retroperitoneum and mesentery. Fatty liver. Enlarged spleen. Enlarged prostate. There is a large lytic lesion in the right iliac bone suggesting metastatic disease. Lumbar spondylosis. Other findings as described in the body of the report. Electronically Signed   By: Elmer Picker M.D.   On: 12/18/2021 11:50   DG Chest Portable 1 View  Result Date: 12/18/2021 CLINICAL DATA:  Chest pain. EXAM: PORTABLE CHEST 1 VIEW COMPARISON:  November 21, 2021.  November 07, 2021. FINDINGS: The heart size and mediastinal contours are within normal limits. Right lung is clear. Known left upper lobe pulmonary nodule is again noted concerning for malignancy. The visualized skeletal structures are unremarkable. IMPRESSION: Known left upper lobe pulmonary nodule is again noted concerning for malignancy. No other abnormality is noted. Electronically Signed   By: Marijo Conception M.D.   On: 12/18/2021 08:54   CT BONE TROCAR/NEEDLE BIOPSY DEEP  Result Date: 12/06/2021 INDICATION: 71 year old male sense for biopsy lytic bone lesion of the pelvis, with a history small cell lung cancer and CLL EXAM: CT BIOPSY CORE BONE DEEP MEDICATIONS: None. ANESTHESIA/SEDATION: Moderate (conscious) sedation was employed during this procedure. A total of Versed 2.0 mg and Fentanyl 100 mcg was administered intravenously. Moderate Sedation Time: 10 minutes. The patient's level of consciousness and vital signs  were monitored continuously by radiology nursing throughout the procedure under my direct supervision. FLUOROSCOPY TIME:  CT COMPLICATIONS: None PROCEDURE: Informed written consent was obtained from the patient after a thorough discussion of the procedural risks, benefits and alternatives. All questions were addressed. Maximal Sterile Barrier Technique was utilized including caps, mask, sterile gowns, sterile gloves, sterile drape, hand hygiene and skin antiseptic. A timeout was performed prior to the initiation of the procedure. Patient was positioned left decubitus on the CT gantry table. Scout CT was performed for planning purposes. The patient is prepped and draped in the usual sterile fashion. 1% lidocaine was used for local anesthesia. Using CT guidance, guide needle was advanced into the lytic lesion of the right iliac bone. Once we confirmed needle tip position multiple 18 go urge core biopsy were performed. The needle was removed and repositioned with repeat imaging performed. Repeat biopsy performed with multiple 18 gauge core biopsy. Needle was removed and a sterile dressing was placed. Patient tolerated the procedure well and remained hemodynamically stable throughout. No complications were encountered and no significant blood loss. IMPRESSION: Status post CT-guided biopsy of right iliac  bone lytic lesion. Signed, Dulcy Fanny. Nadene Rubins, RPVI [facility] Electronically Signed   By: Corrie Mckusick D.O.   On: 12/06/2021 10:01   NM PET Image Initial (PI) Skull Base To Thigh  Result Date: 11/24/2021 CLINICAL DATA:  Initial treatment strategy for non-small cell lung cancer. History of CLL EXAM: NUCLEAR MEDICINE PET SKULL BASE TO THIGH TECHNIQUE: 8.94 mCi F-18 FDG was injected intravenously. Full-ring PET imaging was performed from the skull base to thigh after the radiotracer. CT data was obtained and used for attenuation correction and anatomic  localization. Fasting blood glucose: 87 mg/dl COMPARISON:  Chest CT 11/07/2021 FINDINGS: Mediastinal blood pool activity: SUV max 2.14 Liver activity: SUV max NA NECK: No hypermetabolic lymph nodes in the neck. Incidental CT findings: Bilateral carotid artery calcifications. CHEST: The left upper lobe/left hilar mass is hypermetabolic with SUV max of 18.29. Adjacent hypermetabolic left hilar node has an SUV max of 7.60. Hypermetabolic pre-vascular and AP window nodes are also noted. Larger subcarinal lymph node measures 22 mm and has an SUV max of 14.88. The curvilinear nodular density in the left upper lobe adjacent to the major fissure has an SUV max of 3.08. The more linear left upper lobe density has an SUV max of 3.59. Indeterminate findings but possible endobronchial spread of tumor. No definite metastatic pulmonary nodules. No enlarged or hypermetabolic supraclavicular or axillary nodes. Incidental CT findings: Stable age advanced aortic and coronary artery calcifications. Stable underlying emphysematous changes and pulmonary scarring. ABDOMEN/PELVIS: No abnormal hypermetabolic activity within the liver, pancreas, adrenal glands, or spleen. No hypermetabolic lymph nodes in the abdomen or pelvis. Incidental CT findings: Mild splenomegaly noted. The age advanced atherosclerotic calcification involving the aorta and iliac arteries but no aneurysm. Sigmoid colon diverticulosis. SKELETON: Osseous metastatic disease is demonstrated with a large lytic destructive bone lesion involving the right iliac bone with SUV max of 16.09. There are also destructive bone lesions involving the right second and fourth ribs. The fourth anterior rib lesion is larger and quite destructive. Associated surrounding soft tissue component and SUV max is 14.61. Small lesion involving the right posterior aspect of T5. No canal involvement. SUV max is 6.61. Incidental CT findings: None. IMPRESSION: 1. Large central left upper lobe/left  suprahilar lung mass is hypermetabolic and consistent with known non-small cell lung cancer. Associated hilar and mediastinal lymphadenopathy. 2. No findings for abdominal/pelvic metastatic disease or metastatic pulmonary nodules. 3. Metastatic bone disease with aggressive destructive bone lesions as detailed above. Electronically Signed   By: Marijo Sanes M.D.   On: 11/24/2021 09:34   MR Brain W Wo Contrast  Result Date: 11/23/2021 CLINICAL DATA:  Non-small-cell lung cancer staging EXAM: MRI HEAD WITHOUT AND WITH CONTRAST TECHNIQUE: Multiplanar, multiecho pulse sequences of the brain and surrounding structures were obtained without and with intravenous contrast. CONTRAST:  37mL GADAVIST GADOBUTROL 1 MMOL/ML IV SOLN COMPARISON:  MR head 01/13/2013 FINDINGS: Brain: There is no acute intracranial hemorrhage, extra-axial fluid collection, or acute infarct. Background parenchymal volume is normal. The ventricles are normal in size. Gray-white differentiation is preserved. There are scattered small foci of FLAIR signal abnormality in the supratentorial white matter likely reflecting sequela of mild chronic white matter microangiopathy. There is no suspicious parenchymal signal abnormality. There is no abnormal enhancement. There is no mass lesion there is no mass effect or midline shift. Vascular: Normal flow voids. Skull and upper cervical spine: Normal marrow signal. Sinuses/Orbits: The paranasal sinuses are clear. The globes and  orbits are unremarkable. Other: None. IMPRESSION: No evidence of intracranial metastatic disease. Electronically Signed   By: Valetta Mole M.D.   On: 11/23/2021 16:22   DG Chest 2 View  Result Date: 11/21/2021 CLINICAL DATA:  Increased right-sided chest discomfort. Cough and shortness of breath. COPD exacerbation. EXAM: CHEST - 2 VIEW COMPARISON:  Chest CT 11/07/2021. Chest and rib radiographs 10/22/2021 FINDINGS: Increased bronchial thickening from prior exam. The heart is normal in  size. The left upper lobe nodules on CT are not well seen by radiograph. No pleural effusion or pneumothorax. Stable osseous structures. IMPRESSION: 1. Increased bronchial thickening from prior exam, may represent acute bronchitis/COPD exacerbation. 2. The known left upper lobe nodule on recent CT is not well seen by radiograph. Electronically Signed   By: Keith Rake M.D.   On: 11/21/2021 17:29    Microbiology: Results for orders placed or performed during the hospital encounter of 12/18/21  SARS Coronavirus 2 by RT PCR (hospital order, performed in [facility] hospital lab) *cepheid single result test* Anterior Nasal Swab     Status: None   Collection Time: 12/18/21  2:35 PM   Specimen: Anterior Nasal Swab  Result Value Ref Range Status   SARS Coronavirus 2 by RT PCR NEGATIVE NEGATIVE Final    Comment: (NOTE) SARS-CoV-2 target nucleic acids are NOT DETECTED.  The SARS-CoV-2 RNA is generally detectable in upper and lower respiratory specimens during the acute phase of infection. The lowest concentration of SARS-CoV-2 viral copies this assay can detect is 250 copies / mL. A negative result does not preclude SARS-CoV-2 infection and should not be used as the sole basis for treatment or other patient management decisions.  A negative result may occur with improper specimen collection / handling, submission of specimen other than nasopharyngeal swab, presence of viral mutation(s) within the areas targeted by this assay, and inadequate number of viral copies (<250 copies / mL). A negative result must be combined with clinical observations, patient history, and epidemiological information.  Fact Sheet for Patients:   https://www.patel.info/  Fact Sheet for Healthcare Providers: https://hall.com/  This test is not yet approved or  cleared by the [place] FDA and has been authorized for detection and/or diagnosis of SARS-CoV-2 by FDA under  an Emergency Use Authorization (EUA).  This EUA will remain in effect (meaning this test can be used) for the duration of the COVID-19 declaration under Section 564(b)(1) of the Act, 21 U.S.C. section 360bbb-3(b)(1), unless the authorization is terminated or revoked sooner.  Performed at [facility], [address] [place], [place] [address]     Labs: CBC: Recent Labs  Lab 12/18/21 0937 12/18/21 1542  WBC 185.7* 174.9*  NEUTROABS 11.1*  --   HGB 13.1 13.2  HCT 42.2 41.6  MCV 91.1 90.0  PLT 270 132   Basic Metabolic Panel: Recent Labs  Lab 12/18/21 0937 12/18/21 1542 12/19/21 0451 12/20/21 0331  NA 136  --  135 133*  K 3.7  --  4.0 3.8  CL 101  --  103 104  CO2 24  --  24 23  GLUCOSE 123*  --  121* 125*  BUN 26*  --  20 22  CREATININE 0.80 0.77 0.76 0.79  CALCIUM 9.2  --  8.9 8.7*  MG 2.0  --  2.0 2.0  PHOS  --   --  3.8  --    Liver Function Tests: Recent Labs  Lab 12/18/21 0937  AST 15  ALT 19  ALKPHOS  96  BILITOT 1.0  PROT 6.3*  ALBUMIN 3.5   CBG: Recent Labs  Lab 12/18/21 0948  GLUCAP 122*    Discharge time spent: greater than 30 minutes.  Signed: Orson Eva, MD [facility] 12/20/2021

## 2021-12-20 NOTE — Progress Notes (Signed)
Nsg Discharge Note  Admit Date:  12/18/2021 Discharge date: 12/20/2021   [place] to be D/C'd Home per MD order.  AVS completed.  Copy for chart, and copy for patient signed, and dated. Patient/caregiver able to verbalize understanding.  Discharge Medication: Allergies as of 12/20/2021   No Known Allergies      Medication List     STOP taking these medications    gabapentin 300 MG capsule Commonly known as: Neurontin       TAKE these medications    albuterol 108 (90 Base) MCG/ACT inhaler Commonly known as: VENTOLIN HFA 2 puffs every 4 hours as needed if you can't catch your breath   ALPRAZolam 1 MG tablet Commonly known as: XANAX Take 1 mg by mouth 3 (three) times daily as needed for anxiety.   aspirin 325 MG tablet Take 325 mg by mouth daily.   buPROPion 150 MG 12 hr tablet Commonly known as: WELLBUTRIN SR Take 150 mg by mouth daily.   calcium carbonate 600 MG Tabs tablet Commonly known as: OS-CAL Take 600 mg by mouth daily.   celecoxib 100 MG capsule Commonly known as: CELEBREX Take 100 mg by mouth 2 (two) times daily.   clotrimazole-betamethasone cream Commonly known as: LOTRISONE Apply 1 application topically daily as needed. Affected area   dexamethasone 4 MG tablet Commonly known as: DECADRON Take 2 tablets by mouth 2 (two) times daily.   diltiazem 30 MG tablet Commonly known as: CARDIZEM Take 30 mg by mouth as needed.   fish oil-omega-3 fatty acids 1000 MG capsule Take 1 g by mouth daily.   fluticasone 50 MCG/ACT nasal spray Commonly known as: FLONASE Place 2 sprays into the nose daily.   lidocaine 5 % Commonly known as: Lidoderm Place 1 patch onto the skin daily. Remove and Discard patch within 12 hours.   Loratadine 10 MG Caps Take 10 mg by mouth daily.   lovastatin 20 MG tablet Commonly known as: MEVACOR Take 20 mg by mouth daily.   metoprolol tartrate 25 MG tablet Commonly known as: LOPRESSOR Take 1 tablet (25 mg total)  by mouth 2 (two) times daily. What changed:  medication strength how much to take   multivitamin tablet Take 1 tablet by mouth daily.   niacin 500 MG ER tablet Commonly known as: VITAMIN B3 Take 500 mg by mouth daily.   omeprazole 20 MG capsule Commonly known as: PRILOSEC Take 20 mg by mouth daily.   Oxycodone HCl 10 MG Tabs Take 1 tablet (10 mg total) by mouth every 4 (four) hours as needed.   prochlorperazine 5 MG tablet Commonly known as: COMPAZINE Take 1 tablet (5 mg total) by mouth every 6 (six) hours as needed for nausea or vomiting.        Discharge Assessment: Vitals:   12/20/21 0532 12/20/21 1409  BP: 128/72 108/63  Pulse: 75 77  Resp: 15   Temp: 97.9 F (36.6 C) 97.9 F (36.6 C)  SpO2: 93% 98%   Skin clean, dry and intact without evidence of skin break down, no evidence of skin tears noted. IV catheter discontinued intact. Site without signs and symptoms of complications - no redness or edema noted at insertion site, patient denies c/o pain - only slight tenderness at site.  Dressing with slight pressure applied.  D/c Instructions-Education: Discharge instructions given to patient/family with verbalized understanding. D/c education completed with patient/family including follow up instructions, medication list, d/c activities limitations if indicated, with other d/c instructions as indicated  by MD - patient able to verbalize understanding, all questions fully answered. Patient instructed to return to ED, call 911, or call MD for any changes in condition.  Patient escorted via [place], and D/C home via private auto.  Clovis Fredrickson, LPN 6/81/5947 0:76 PM

## 2021-12-21 ENCOUNTER — Encounter: Payer: Self-pay | Admitting: Cardiology

## 2021-12-21 DIAGNOSIS — C9110 Chronic lymphocytic leukemia of B-cell type not having achieved remission: Secondary | ICD-10-CM | POA: Diagnosis not present

## 2021-12-21 DIAGNOSIS — J449 Chronic obstructive pulmonary disease, unspecified: Secondary | ICD-10-CM | POA: Diagnosis not present

## 2021-12-21 DIAGNOSIS — C3412 Malignant neoplasm of upper lobe, left bronchus or lung: Secondary | ICD-10-CM | POA: Diagnosis not present

## 2021-12-21 DIAGNOSIS — E860 Dehydration: Secondary | ICD-10-CM | POA: Diagnosis not present

## 2021-12-21 DIAGNOSIS — C7951 Secondary malignant neoplasm of bone: Secondary | ICD-10-CM | POA: Diagnosis not present

## 2021-12-21 DIAGNOSIS — Z510 Encounter for antineoplastic radiation therapy: Secondary | ICD-10-CM | POA: Diagnosis not present

## 2021-12-24 ENCOUNTER — Other Ambulatory Visit: Payer: Self-pay

## 2021-12-24 DIAGNOSIS — Z510 Encounter for antineoplastic radiation therapy: Secondary | ICD-10-CM | POA: Diagnosis not present

## 2021-12-24 DIAGNOSIS — I10 Essential (primary) hypertension: Secondary | ICD-10-CM | POA: Diagnosis not present

## 2021-12-24 DIAGNOSIS — G4700 Insomnia, unspecified: Secondary | ICD-10-CM | POA: Diagnosis not present

## 2021-12-24 DIAGNOSIS — C3412 Malignant neoplasm of upper lobe, left bronchus or lung: Secondary | ICD-10-CM | POA: Diagnosis not present

## 2021-12-24 DIAGNOSIS — E860 Dehydration: Secondary | ICD-10-CM | POA: Diagnosis not present

## 2021-12-24 DIAGNOSIS — J449 Chronic obstructive pulmonary disease, unspecified: Secondary | ICD-10-CM | POA: Diagnosis not present

## 2021-12-24 DIAGNOSIS — R1110 Vomiting, unspecified: Secondary | ICD-10-CM | POA: Diagnosis not present

## 2021-12-24 DIAGNOSIS — C3490 Malignant neoplasm of unspecified part of unspecified bronchus or lung: Secondary | ICD-10-CM | POA: Diagnosis not present

## 2021-12-24 DIAGNOSIS — Z681 Body mass index (BMI) 19 or less, adult: Secondary | ICD-10-CM | POA: Diagnosis not present

## 2021-12-24 DIAGNOSIS — R079 Chest pain, unspecified: Secondary | ICD-10-CM | POA: Diagnosis not present

## 2021-12-24 DIAGNOSIS — C9110 Chronic lymphocytic leukemia of B-cell type not having achieved remission: Secondary | ICD-10-CM | POA: Diagnosis not present

## 2021-12-24 DIAGNOSIS — F411 Generalized anxiety disorder: Secondary | ICD-10-CM | POA: Diagnosis not present

## 2021-12-24 DIAGNOSIS — C7951 Secondary malignant neoplasm of bone: Secondary | ICD-10-CM | POA: Diagnosis not present

## 2021-12-25 ENCOUNTER — Other Ambulatory Visit: Payer: Self-pay

## 2021-12-25 DIAGNOSIS — J449 Chronic obstructive pulmonary disease, unspecified: Secondary | ICD-10-CM | POA: Diagnosis not present

## 2021-12-25 DIAGNOSIS — E860 Dehydration: Secondary | ICD-10-CM | POA: Diagnosis not present

## 2021-12-25 DIAGNOSIS — C3412 Malignant neoplasm of upper lobe, left bronchus or lung: Secondary | ICD-10-CM | POA: Diagnosis not present

## 2021-12-25 DIAGNOSIS — C9110 Chronic lymphocytic leukemia of B-cell type not having achieved remission: Secondary | ICD-10-CM | POA: Diagnosis not present

## 2021-12-25 DIAGNOSIS — Z510 Encounter for antineoplastic radiation therapy: Secondary | ICD-10-CM | POA: Diagnosis not present

## 2021-12-25 DIAGNOSIS — C7951 Secondary malignant neoplasm of bone: Secondary | ICD-10-CM | POA: Diagnosis not present

## 2021-12-25 NOTE — Telephone Encounter (Signed)
Not sure based on notes why dose was lowered, usually would be if low heart rates or blood pressures but I don't see either was an issue. I would go back to the 37.5mg  bid of the lopressor  J Carlotta Telfair MD

## 2021-12-26 DIAGNOSIS — Z5181 Encounter for therapeutic drug level monitoring: Secondary | ICD-10-CM | POA: Diagnosis not present

## 2021-12-26 DIAGNOSIS — C3412 Malignant neoplasm of upper lobe, left bronchus or lung: Secondary | ICD-10-CM | POA: Diagnosis not present

## 2021-12-26 DIAGNOSIS — D801 Nonfamilial hypogammaglobulinemia: Secondary | ICD-10-CM | POA: Diagnosis not present

## 2021-12-26 DIAGNOSIS — J449 Chronic obstructive pulmonary disease, unspecified: Secondary | ICD-10-CM | POA: Diagnosis not present

## 2021-12-26 DIAGNOSIS — C9110 Chronic lymphocytic leukemia of B-cell type not having achieved remission: Secondary | ICD-10-CM | POA: Diagnosis not present

## 2021-12-26 DIAGNOSIS — E860 Dehydration: Secondary | ICD-10-CM | POA: Diagnosis not present

## 2021-12-26 DIAGNOSIS — Z510 Encounter for antineoplastic radiation therapy: Secondary | ICD-10-CM | POA: Diagnosis not present

## 2021-12-26 DIAGNOSIS — C7951 Secondary malignant neoplasm of bone: Secondary | ICD-10-CM | POA: Diagnosis not present

## 2021-12-26 MED ORDER — OXYCODONE HCL 10 MG PO TABS: 10.0000 mg | ORAL_TABLET | ORAL | 0 refills | Status: DC | PRN

## 2021-12-27 DIAGNOSIS — C9110 Chronic lymphocytic leukemia of B-cell type not having achieved remission: Secondary | ICD-10-CM | POA: Diagnosis not present

## 2021-12-27 DIAGNOSIS — J449 Chronic obstructive pulmonary disease, unspecified: Secondary | ICD-10-CM | POA: Diagnosis not present

## 2021-12-27 DIAGNOSIS — Z510 Encounter for antineoplastic radiation therapy: Secondary | ICD-10-CM | POA: Diagnosis not present

## 2021-12-27 DIAGNOSIS — E860 Dehydration: Secondary | ICD-10-CM | POA: Diagnosis not present

## 2021-12-27 DIAGNOSIS — C3412 Malignant neoplasm of upper lobe, left bronchus or lung: Secondary | ICD-10-CM | POA: Diagnosis not present

## 2021-12-27 DIAGNOSIS — C7951 Secondary malignant neoplasm of bone: Secondary | ICD-10-CM | POA: Diagnosis not present

## 2021-12-28 DIAGNOSIS — C7951 Secondary malignant neoplasm of bone: Secondary | ICD-10-CM | POA: Diagnosis not present

## 2021-12-28 DIAGNOSIS — Z510 Encounter for antineoplastic radiation therapy: Secondary | ICD-10-CM | POA: Diagnosis not present

## 2021-12-28 DIAGNOSIS — C9110 Chronic lymphocytic leukemia of B-cell type not having achieved remission: Secondary | ICD-10-CM | POA: Diagnosis not present

## 2021-12-28 DIAGNOSIS — J449 Chronic obstructive pulmonary disease, unspecified: Secondary | ICD-10-CM | POA: Diagnosis not present

## 2021-12-28 DIAGNOSIS — E860 Dehydration: Secondary | ICD-10-CM | POA: Diagnosis not present

## 2021-12-28 DIAGNOSIS — C3412 Malignant neoplasm of upper lobe, left bronchus or lung: Secondary | ICD-10-CM | POA: Diagnosis not present

## 2021-12-31 DIAGNOSIS — C7951 Secondary malignant neoplasm of bone: Secondary | ICD-10-CM | POA: Diagnosis not present

## 2021-12-31 DIAGNOSIS — E785 Hyperlipidemia, unspecified: Secondary | ICD-10-CM | POA: Diagnosis not present

## 2021-12-31 DIAGNOSIS — C3492 Malignant neoplasm of unspecified part of left bronchus or lung: Secondary | ICD-10-CM | POA: Diagnosis not present

## 2021-12-31 DIAGNOSIS — Z510 Encounter for antineoplastic radiation therapy: Secondary | ICD-10-CM | POA: Diagnosis not present

## 2021-12-31 DIAGNOSIS — F17210 Nicotine dependence, cigarettes, uncomplicated: Secondary | ICD-10-CM | POA: Diagnosis not present

## 2021-12-31 DIAGNOSIS — Z5181 Encounter for therapeutic drug level monitoring: Secondary | ICD-10-CM | POA: Diagnosis not present

## 2021-12-31 DIAGNOSIS — J449 Chronic obstructive pulmonary disease, unspecified: Secondary | ICD-10-CM | POA: Diagnosis not present

## 2021-12-31 DIAGNOSIS — C9110 Chronic lymphocytic leukemia of B-cell type not having achieved remission: Secondary | ICD-10-CM | POA: Diagnosis not present

## 2021-12-31 DIAGNOSIS — D801 Nonfamilial hypogammaglobulinemia: Secondary | ICD-10-CM | POA: Diagnosis not present

## 2021-12-31 DIAGNOSIS — C3412 Malignant neoplasm of upper lobe, left bronchus or lung: Secondary | ICD-10-CM | POA: Diagnosis not present

## 2021-12-31 DIAGNOSIS — E860 Dehydration: Secondary | ICD-10-CM | POA: Diagnosis not present

## 2021-12-31 DIAGNOSIS — Z79899 Other long term (current) drug therapy: Secondary | ICD-10-CM | POA: Diagnosis not present

## 2021-12-31 DIAGNOSIS — M792 Neuralgia and neuritis, unspecified: Secondary | ICD-10-CM | POA: Diagnosis not present

## 2022-01-01 DIAGNOSIS — Z510 Encounter for antineoplastic radiation therapy: Secondary | ICD-10-CM | POA: Diagnosis not present

## 2022-01-01 DIAGNOSIS — C3412 Malignant neoplasm of upper lobe, left bronchus or lung: Secondary | ICD-10-CM | POA: Diagnosis not present

## 2022-01-01 DIAGNOSIS — C7951 Secondary malignant neoplasm of bone: Secondary | ICD-10-CM | POA: Diagnosis not present

## 2022-01-01 DIAGNOSIS — E860 Dehydration: Secondary | ICD-10-CM | POA: Diagnosis not present

## 2022-01-01 DIAGNOSIS — C9110 Chronic lymphocytic leukemia of B-cell type not having achieved remission: Secondary | ICD-10-CM | POA: Diagnosis not present

## 2022-01-01 DIAGNOSIS — J449 Chronic obstructive pulmonary disease, unspecified: Secondary | ICD-10-CM | POA: Diagnosis not present

## 2022-01-02 DIAGNOSIS — Z510 Encounter for antineoplastic radiation therapy: Secondary | ICD-10-CM | POA: Diagnosis not present

## 2022-01-02 DIAGNOSIS — C7951 Secondary malignant neoplasm of bone: Secondary | ICD-10-CM | POA: Diagnosis not present

## 2022-01-02 DIAGNOSIS — C3412 Malignant neoplasm of upper lobe, left bronchus or lung: Secondary | ICD-10-CM | POA: Diagnosis not present

## 2022-01-02 DIAGNOSIS — C9110 Chronic lymphocytic leukemia of B-cell type not having achieved remission: Secondary | ICD-10-CM | POA: Diagnosis not present

## 2022-01-02 DIAGNOSIS — J449 Chronic obstructive pulmonary disease, unspecified: Secondary | ICD-10-CM | POA: Diagnosis not present

## 2022-01-02 DIAGNOSIS — E860 Dehydration: Secondary | ICD-10-CM | POA: Diagnosis not present

## 2022-01-03 ENCOUNTER — Ambulatory Visit: Payer: Medicare Other | Admitting: Hematology

## 2022-01-07 ENCOUNTER — Inpatient Hospital Stay (HOSPITAL_BASED_OUTPATIENT_CLINIC_OR_DEPARTMENT_OTHER): Payer: Medicare Other | Admitting: Hematology

## 2022-01-07 ENCOUNTER — Inpatient Hospital Stay: Payer: Medicare Other | Attending: Hematology

## 2022-01-07 ENCOUNTER — Other Ambulatory Visit: Payer: Self-pay

## 2022-01-07 VITALS — BP 94/58 | HR 95 | Temp 97.6°F | Resp 18 | Ht 73.0 in | Wt 151.5 lb

## 2022-01-07 DIAGNOSIS — Z79899 Other long term (current) drug therapy: Secondary | ICD-10-CM | POA: Diagnosis not present

## 2022-01-07 DIAGNOSIS — Z5111 Encounter for antineoplastic chemotherapy: Secondary | ICD-10-CM | POA: Diagnosis not present

## 2022-01-07 DIAGNOSIS — Z5112 Encounter for antineoplastic immunotherapy: Secondary | ICD-10-CM | POA: Diagnosis not present

## 2022-01-07 DIAGNOSIS — R197 Diarrhea, unspecified: Secondary | ICD-10-CM | POA: Insufficient documentation

## 2022-01-07 DIAGNOSIS — C3412 Malignant neoplasm of upper lobe, left bronchus or lung: Secondary | ICD-10-CM | POA: Insufficient documentation

## 2022-01-07 DIAGNOSIS — C3492 Malignant neoplasm of unspecified part of left bronchus or lung: Secondary | ICD-10-CM

## 2022-01-07 DIAGNOSIS — F17210 Nicotine dependence, cigarettes, uncomplicated: Secondary | ICD-10-CM | POA: Insufficient documentation

## 2022-01-07 DIAGNOSIS — C9110 Chronic lymphocytic leukemia of B-cell type not having achieved remission: Secondary | ICD-10-CM

## 2022-01-07 DIAGNOSIS — R110 Nausea: Secondary | ICD-10-CM | POA: Insufficient documentation

## 2022-01-07 DIAGNOSIS — C7951 Secondary malignant neoplasm of bone: Secondary | ICD-10-CM | POA: Diagnosis not present

## 2022-01-07 DIAGNOSIS — C3490 Malignant neoplasm of unspecified part of unspecified bronchus or lung: Secondary | ICD-10-CM

## 2022-01-07 LAB — CBC WITH DIFFERENTIAL/PLATELET
Abs Immature Granulocytes: 0.3 10*3/uL — ABNORMAL HIGH (ref 0.00–0.07)
Basophils Absolute: 0 10*3/uL (ref 0.0–0.1)
Basophils Relative: 0 %
Eosinophils Absolute: 0.3 10*3/uL (ref 0.0–0.5)
Eosinophils Relative: 0 %
HCT: 38.4 % — ABNORMAL LOW (ref 39.0–52.0)
Hemoglobin: 11.8 g/dL — ABNORMAL LOW (ref 13.0–17.0)
Immature Granulocytes: 0 %
Lymphocytes Relative: 90 %
Lymphs Abs: 84.3 10*3/uL — ABNORMAL HIGH (ref 0.7–4.0)
MCH: 28.6 pg (ref 26.0–34.0)
MCHC: 30.7 g/dL (ref 30.0–36.0)
MCV: 93 fL (ref 80.0–100.0)
Monocytes Absolute: 0.8 10*3/uL (ref 0.1–1.0)
Monocytes Relative: 1 %
Neutro Abs: 8.3 10*3/uL — ABNORMAL HIGH (ref 1.7–7.7)
Neutrophils Relative %: 9 %
Platelets: 111 10*3/uL — ABNORMAL LOW (ref 150–400)
RBC: 4.13 MIL/uL — ABNORMAL LOW (ref 4.22–5.81)
RDW: 17.8 % — ABNORMAL HIGH (ref 11.5–15.5)
WBC: 94.2 10*3/uL (ref 4.0–10.5)
nRBC: 0 % (ref 0.0–0.2)

## 2022-01-07 LAB — COMPREHENSIVE METABOLIC PANEL
ALT: 40 U/L (ref 0–44)
AST: 18 U/L (ref 15–41)
Albumin: 3 g/dL — ABNORMAL LOW (ref 3.5–5.0)
Alkaline Phosphatase: 101 U/L (ref 38–126)
Anion gap: 7 (ref 5–15)
BUN: 25 mg/dL — ABNORMAL HIGH (ref 8–23)
CO2: 28 mmol/L (ref 22–32)
Calcium: 9 mg/dL (ref 8.9–10.3)
Chloride: 101 mmol/L (ref 98–111)
Creatinine, Ser: 1.14 mg/dL (ref 0.61–1.24)
GFR, Estimated: >60 mL/min (ref >60)
Glucose, Bld: 122 mg/dL — ABNORMAL HIGH (ref 70–99)
Potassium: 5.3 mmol/L — ABNORMAL HIGH (ref 3.5–5.1)
Sodium: 136 mmol/L (ref 135–145)
Total Bilirubin: 0.8 mg/dL (ref 0.3–1.2)
Total Protein: 6.1 g/dL — ABNORMAL LOW (ref 6.5–8.1)

## 2022-01-07 LAB — LACTATE DEHYDROGENASE: LDH: 104 U/L (ref 98–192)

## 2022-01-07 MED ORDER — OXYCODONE HCL ER 20 MG PO T12A: 20.0000 mg | EXTENDED_RELEASE_TABLET | Freq: Two times a day (BID) | ORAL | 0 refills | Status: DC

## 2022-01-07 NOTE — Progress Notes (Signed)
START ON PATHWAY REGIMEN - Non-Small Cell Lung     A cycle is every 21 days:     Pembrolizumab      Paclitaxel      Carboplatin   **Always confirm dose/schedule in your pharmacy ordering system**  Patient Characteristics: Stage IV Metastatic, Squamous, Molecular Analysis Completed, Alteration Present and Targeted Therapy Exhausted or EGFR Exon 20 Insertion or KRAS G12C or HER2 Present, and No Prior Chemo/Immunotherapy or No Alteration Present, PS = 0, 1, Initial  Chemotherapy/Immunotherapy, No Alteration Present, Immunotherapy Candidate, PD-L1 Expression Positive 1-49% (TPS) / Negative / Not Tested / Awaiting Test Results Therapeutic Status: Stage IV Metastatic Histology: Squamous Cell Molecular Analysis Results: No Alteration Present ECOG Performance Status: 1 Chemotherapy/Immunotherapy Line of Therapy: Initial Chemotherapy/Immunotherapy Immunotherapy Candidate Status: Candidate for Immunotherapy PD-L1 Expression Status: PD-L1 Positive 1-49% (TPS) Intent of Therapy: Non-Curative / Palliative Intent, Discussed with Patient 

## 2022-01-07 NOTE — Progress Notes (Signed)
Alfred Brown, Alfred Brown   CLINIC:  Medical Oncology/Hematology  PCP:  [facility], [place] [id number] [phone]   REASON FOR VISIT:  Follow-up for metastatic squamous cell lung cancer to the bones  PRIOR THERAPY: None  NGS Results: PD-L1 TPS 40%, no other targetable mutations  CURRENT THERAPY: Carboplatin, paclitaxel and pembrolizumab  BRIEF ONCOLOGIC HISTORY:  Oncology History   No history exists.    CANCER STAGING:  Cancer Staging  No matching staging information was found for the patient.   INTERVAL HISTORY:  Alfred Brown 71 y.o. male seen for follow-up of metastatic lung cancer.  He is accompanied by his wife today.  He reports right-sided chest wall pain.  He completed palliative radiation to the bone lesions on 01/02/2022.  Pelvic pain has improved.  Right chest wall pain is reported as 8-10/10 which improved to 6/10 with oxycodone 10 mg every 4 hours.  He reports constipation since the start of oxycodone and is taking Senokot and MiraLAX.  His eating has been failure but eating small quantities 3 times a day.  Also drinking boost every other day.  Lost about 13 pounds in the last 1 month.    REVIEW OF SYSTEMS:  Review of Systems  Respiratory:  Positive for cough and shortness of breath.   Cardiovascular:  Positive for chest pain (Right-sided rib pain).  Gastrointestinal:  Positive for constipation.  All other systems reviewed and are negative.    PAST MEDICAL/SURGICAL HISTORY:  Past Medical History:  Diagnosis Date   Aortic valve disorder    Mild insufficiency   Chronic lymphocytic leukemia ([facility])    01/2012: WBC-90.2, H&H-15.1/45.3, platelets-185 03/31/12: Verified with Dr. Tressie Stalker that no precautions or modification of medical regime are required prior to orthopaedic surgery.    CLL (chronic lymphocytic leukemia) (HCC)    CMV (cytomegalovirus) (HCC)    COPD (chronic obstructive  pulmonary disease) (HCC)    Depression with anxiety    DJD (degenerative joint disease)    GERD (gastroesophageal reflux disease)    Hyperlipidemia    Hypogammaglobulinemia ([place]) 09/23/2019   Lipoma    left chest wall   Palpitations 2004   PVCs; borderline stress nuclear in 2004, negative in 2007; Echo 2007; AV-sclerotic, very mild AI   Pneumonia    Right bundle branch block    + left posterior fascicular block   Tobacco abuse    80 pack years   Past Surgical History:  Procedure Laterality Date   COLONOSCOPY  01/31/2012   Negative screening study   GANGLION CYST EXCISION  04/02/1995   left wrist   LIPOMA EXCISION Left 10/11/2021   chest wall   ROTATOR CUFF REPAIR  04/02/1995   right   SHOULDER ARTHROSCOPY WITH SUBACROMIAL DECOMPRESSION  04/09/2012   Procedure: SHOULDER ARTHROSCOPY WITH SUBACROMIAL DECOMPRESSION;  Surgeon: Ninetta Lights, MD;  Location: [facility];  Service: Orthopedics;  Laterality: Left;  LEFT SHOULDER ARTHROSCOPY, SUBACROMIAL DECOMPRESSION, PARTIAL ACROMIOPLASTY WITH CORACOACROMIAL RELEASE, DISTAL CLAVICULECTOMY WITH ROTATOR CUFF REPAIR, DEBRIDEMENT OF LABRUM     SOCIAL HISTORY:  Social History   Socioeconomic History   Marital status: Married    Spouse name: Not on file   Number of children: Not on file   Years of education: Not on file   Highest education level: Not on file  Occupational History   Not on file  Tobacco Use   Smoking status: Every Day    Packs/day:  1.00    Years: 45.00    Total pack years: 45.00    Types: Cigarettes    Start date: 12/03/1963   Smokeless tobacco: Never   Tobacco comments:    Pt states he smokes almost a whole pack daily. 12/04/21  Vaping Use   Vaping Use: Never used  Substance and Sexual Activity   Alcohol use: No    Alcohol/week: 0.0 standard drinks of alcohol   Drug use: No   Sexual activity: Not on file  Other Topics Concern   Not on file  Social History Narrative   Not on file   Social  Determinants of Health   Financial Resource Strain: Not on file  Food Insecurity: No Food Insecurity (12/18/2021)   Hunger Vital Sign    Worried About Running Out of Food in the Last Year: Never true    Ran Out of Food in the Last Year: Never true  Transportation Needs: No Transportation Needs (12/18/2021)   PRAPARE - Hydrologist (Medical): No    Lack of Transportation (Non-Medical): No  Physical Activity: Not on file  Stress: Not on file  Social Connections: Not on file  Intimate Partner Violence: Not At Risk (12/18/2021)   Humiliation, Afraid, Rape, and Kick questionnaire    Fear of Current or Ex-Partner: No    Emotionally Abused: No    Physically Abused: No    Sexually Abused: No    FAMILY HISTORY:  Family History  Problem Relation Age of Onset   Stroke Mother    Heart attack Father    Cancer Sister        colon   Colon cancer Sister    Cancer Brother        2 brothers died with lung cancer    CURRENT MEDICATIONS:  Outpatient Encounter Medications as of 01/07/2022  Medication Sig   albuterol (VENTOLIN HFA) 108 (90 Base) MCG/ACT inhaler 2 puffs every 4 hours as needed if you can't catch your breath   ALPRAZolam (XANAX) 1 MG tablet Take 1 mg by mouth 3 (three) times daily as needed for anxiety.    aspirin 325 MG tablet Take 325 mg by mouth daily.   buPROPion (WELLBUTRIN SR) 150 MG 12 hr tablet Take 150 mg by mouth daily.   calcium carbonate (OS-CAL) 600 MG TABS Take 600 mg by mouth daily.   celecoxib (CELEBREX) 100 MG capsule Take 100 mg by mouth 2 (two) times daily.   clotrimazole-betamethasone (LOTRISONE) cream Apply 1 application topically daily as needed. Affected area   diltiazem (CARDIZEM) 30 MG tablet Take 30 mg by mouth as needed.   fish oil-omega-3 fatty acids 1000 MG capsule Take 1 g by mouth daily.   fluticasone (FLONASE) 50 MCG/ACT nasal spray Place 2 sprays into the nose daily.   lidocaine (LIDODERM) 5 % Place 1 patch onto the skin  daily. Remove and Discard patch within 12 hours.   Loratadine 10 MG CAPS Take 10 mg by mouth daily.   lovastatin (MEVACOR) 20 MG tablet Take 20 mg by mouth daily.   metoprolol tartrate (LOPRESSOR) 25 MG tablet Take 1 tablet (25 mg total) by mouth 2 (two) times daily.   Multiple Vitamin (MULTIVITAMIN) tablet Take 1 tablet by mouth daily.   niacin (NIASPAN) 500 MG CR tablet Take 500 mg by mouth daily.   Oxycodone HCl 10 MG TABS Take 1 tablet (10 mg total) by mouth every 4 (four) hours as needed.   prochlorperazine (COMPAZINE) 5  MG tablet Take 1 tablet (5 mg total) by mouth every 6 (six) hours as needed for nausea or vomiting.   zolpidem (AMBIEN) 10 MG tablet Take 10 mg by mouth at bedtime as needed.   [DISCONTINUED] dexamethasone (DECADRON) 4 MG tablet Take 2 tablets by mouth 2 (two) times daily.   [DISCONTINUED] omeprazole (PRILOSEC) 20 MG capsule Take 20 mg by mouth daily.   No facility-administered encounter medications on file as of 01/07/2022.    ALLERGIES:  No Known Allergies   PHYSICAL EXAM:  ECOG Performance status: 1  Vitals:   01/07/22 1335  BP: (!) 94/58  Pulse: 95  Resp: 18  Temp: 97.6 F (36.4 C)  SpO2: 96%   Filed Weights   01/07/22 1335  Weight: 151 lb 8 oz (68.7 kg)   Physical Exam Vitals reviewed.  Constitutional:      Appearance: Normal appearance.  Cardiovascular:     Rate and Rhythm: Normal rate and regular rhythm.     Heart sounds: Normal heart sounds.  Pulmonary:     Effort: Pulmonary effort is normal.     Breath sounds: Normal breath sounds.  Abdominal:     Palpations: Abdomen is soft. There is no mass.  Neurological:     Mental Status: He is alert.  Psychiatric:        Mood and Affect: Mood normal.        Behavior: Behavior normal.      LABORATORY DATA:  I have reviewed the labs as listed.  CBC    Component Value Date/Time   WBC 94.2 (HH) 01/07/2022 1255   RBC 4.13 (L) 01/07/2022 1255   HGB 11.8 (L) 01/07/2022 1255   HCT 38.4 (L)  01/07/2022 1255   PLT 111 (L) 01/07/2022 1255   MCV 93.0 01/07/2022 1255   MCH 28.6 01/07/2022 1255   MCHC 30.7 01/07/2022 1255   RDW 17.8 (H) 01/07/2022 1255   LYMPHSABS 84.3 (H) 01/07/2022 1255   MONOABS 0.8 01/07/2022 1255   EOSABS 0.3 01/07/2022 1255   BASOSABS 0.0 01/07/2022 1255      Latest Ref Rng & Units 01/07/2022   12:55 PM 12/20/2021    3:31 AM 12/19/2021    4:51 AM  CMP  Glucose 70 - 99 mg/dL 122  125  121   BUN 8 - 23 mg/dL $Remove'25  22  20   'IBGzMcx$ Creatinine 0.61 - 1.24 mg/dL 1.14  0.79  0.76   Sodium 135 - 145 mmol/L 136  133  135   Potassium 3.5 - 5.1 mmol/L 5.3  3.8  4.0   Chloride 98 - 111 mmol/L 101  104  103   CO2 22 - 32 mmol/L $RemoveB'28  23  24   'xzjPnJiD$ Calcium 8.9 - 10.3 mg/dL 9.0  8.7  8.9   Total Protein 6.5 - 8.1 g/dL 6.1     Total Bilirubin 0.3 - 1.2 mg/dL 0.8     Alkaline Phos 38 - 126 U/L 101     AST 15 - 41 U/L 18     ALT 0 - 44 U/L 40       DIAGNOSTIC IMAGING:  I have independently reviewed the scans and discussed with the patient.  ASSESSMENT: 1.  Metastatic squamous cell lung cancer to the bones, PD-L1-40%: - Presentation with cough with yellowish expectoration in the middle of May. - Right chest wall pain started in July, worse on coughing and sharp in nature. - No B symptoms. - CT chest (11/07/2021): Irregular bandlike mass traverses  the left upper lobe.  1.4 cm solid nodule is identified adjacent to 1.2 cm cystic component in the posterior left upper lobe.  There is prominent left hilar density suspicious for adenopathy.  Cortical destruction of the right anterior fourth rib suspicious for metastatic disease. - Right iliac bone biopsy (12/06/2021): Metastatic well to moderately differentiated squamous cell carcinoma. - PET scan (11/22/2021): Large central left upper lobe/left suprahilar lung mass with hilar and mediastinal adenopathy.  Bone metastatic disease with large lytic lesion involving right iliac bone.  Destructive bone lesions involving the right second and fourth  ribs. - Brain MRI (11/23/2021): No evidence of intracranial metastatic disease. - NGS: PD-L1 (22 C3) TPS-40%, TMB-high, T p53 pathogenic variant, RB1 pathogenic variant, MSI-stable, no other targetable mutations. - Completed palliative XRT to the bone lesions on 01/02/2022   2.  Stage II CLL by [facility] system: - Diagnosed around 2010, observation since then. 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   3.  Social/family history: - Lives at home with his wife.  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  He had exposure to diesel fuel but no asbestos. - 3-4 brothers had lung cancer.  Sister had colon cancer.   PLAN:  1.  Metastatic squamous cell lung cancer to the bones, PD-L1 40%, TMB-high: - We discussed PET scan images and MRI results. - We discussed NGS test results in detail. - Recommend chemoimmunotherapy with carboplatin, paclitaxel and pembrolizumab. - We discussed schedule and side effects in detail.  Literature was given. - Schedule for port placement and start of chemotherapy. - He will need shower chair because of poor functional status.  2.  Right chest wall pain: - Completed palliative XRT to the bone lesions on 01/02/2022. - He is taking oxycodone 10 mg every 4 hours as needed which is not controlling adequately. - He has tried gabapentin which did not help.  We will start him on OxyContin 20 mg every 12 hours.  Continue oxycodone 10 mg for breakthrough pain as needed.  3.  Nutrition: - He has lost about 13 pounds in the last 1 month. - He is drinking boost every other day and eating small quantities 3 times daily. - Recommend nutrition consult.    Orders placed this encounter:  Orders Placed This Encounter  Procedures   IR IMAGING GUIDED PORT INSERTION      Derek Jack, [place] [phone]

## 2022-01-07 NOTE — Progress Notes (Signed)
CRITICAL VALUE STICKER  CRITICAL VALUE:  RECEIVER (on-site recipient of call):  DATE & TIME NOTIFIED: 01/07/22 1355  MESSENGER (representative from lab):  Thyra Breed  MD NOTIFIED: Derek Jack  TIME OF NOTIFICATION:  9295  RESPONSE:  No orders at this time.

## 2022-01-07 NOTE — Patient Instructions (Addendum)
[facility]  Discharge Instructions  You were seen and examined today by Dr. Delton Coombes.  Dr. Delton Coombes has reviewed your most recent biopsy and PET scan.  You have been diagnosed with Stage IV Lung (Squamous Cell Carcinoma) Cancer. Because it has spread beyond the lung, it is not curable but it is controllable with a combination of chemotherapy and immunotherapy.  Dr. Delton Coombes has recommended treatment with chemotherapy known as Carboplatin and Paclitaxel (Carbo/Taxol) and immunotherapy known as Pembrolizumab (Keytruda). Treatment is given once every 21 days here in the [facility]. You will be given at least 4 cycles (up to 6 cycles) of chemotherapy, but you will remain on immunotherapy every 21 days as long as it helps. You will have a scan after every 3rd cycle.  On your Next Generation Sequencing (NGS), or mutation testing, did not show that you will have any benefit from any oral (pill) chemotherapies.  Prior to starting chemotherapy, you will need a Port-A-Cath. This can be placed by the Interventional Radiologist at either [facility] or [facility].  Follow-up as scheduled.  Thank you for choosing [facility] to provide your oncology and hematology care.   To afford each patient quality time with our provider, please arrive at least 15 minutes before your scheduled appointment time. You may need to reschedule your appointment if you arrive late (10 or more minutes). Arriving late affects you and other patients whose appointments are after yours.  Also, if you miss three or more appointments without notifying the office, you may be dismissed from the clinic at the provider's discretion.    Again, thank you for choosing [facility]  Our hope is that these requests will decrease the amount of time that you wait before being seen by our physicians.   If you have a lab appointment with the [facility] please come in  thru the Main Entrance and check in at the main information desk.           _____________________________________________________________  Should you have questions after your visit to [facility], please contact our office at [phone] and follow the prompts.  Our office hours are 8:00 a.m. to 4:30 p.m. Monday - Thursday and 8:00 a.m. to 2:30 p.m. Friday.  Please note that voicemails left after 4:00 p.m. may not be returned until the following business day.  We are closed weekends and all major holidays.  You do have access to a nurse 24-7, just call the main number to the clinic [phone] and do not press any options, hold on the line and a nurse will answer the phone.    For prescription refill requests, have your pharmacy contact our office and allow 72 hours.    Masks are optional in the cancer centers. If you would like for your care team to wear a mask while they are taking care of you, please let them know. You may have one support person who is at least 70 years old accompany you for your appointments.

## 2022-01-08 ENCOUNTER — Other Ambulatory Visit: Payer: Self-pay

## 2022-01-09 LAB — IGG, IGA, IGM
IgA: 15 mg/dL — ABNORMAL LOW (ref 61–437)
IgG (Immunoglobin G), Serum: 251 mg/dL — ABNORMAL LOW (ref 603–1613)
IgM (Immunoglobulin M), Srm: 8 mg/dL — ABNORMAL LOW (ref 20–172)

## 2022-01-09 NOTE — Patient Instructions (Addendum)
[facility] Chemotherapy Teaching     You are diagnosed with metastatic (Stage IV) non small cell lung cancer.  You will be treated in the clinic every 3 weeks with a combination of chemotherapy and immunotherapy drugs. Those drugs are paclitaxel (Taxol), carboplatin, and pembrolizumab (Keytruda).  You will receive all three drugs every 3 weeks for 4-6 cycles.  After that time you will come every 3 weeks to receive Keytruda only for maintenance therapy.  The intent of treatment is to shrink and control this cancer, prevent it from spreading further, and to alleviate any symptoms you may be having related to this disease.  You will see the doctor regularly throughout treatment.  We will obtain blood work from you prior to every treatment and monitor your results to make sure it is safe to give your treatment. The doctor monitors your response to treatment by the way you are feeling, your blood work, and by obtaining scans periodically.  There will be wait times while you are here for treatment.  It will take about 30 minutes to 1 hour for your lab work to result.  Then there will be wait times while pharmacy mixes your medications.      Medications you will receive in the clinic prior to your chemotherapy medications:   Medications you will receive in the clinic prior to your chemotherapy medications:  Aloxi:  ALOXI is used in adults to help prevent nausea and vomiting that happens with certain chemotherapy drugs.  Aloxi is a long acting medication, and will remain in your system for about two days.   Emend:  This is an anti-nausea medication that is used with Aloxi to help prevent nausea and vomiting caused by chemotherapy.  Dexamethasone:  This is a steroid given prior to chemotherapy to help prevent allergic reactions; it may also help prevent and control nausea and diarrhea.  Pepcid:  This medication is a histamine blocker that helps prevent and allergic reaction to your chemotherapy.     Benadryl:  This is a histamine blocker that helps prevent allergic/infusion reactions to your chemotherapy. This medication may cause dizziness/drowsiness.    Pepcid:  This is a different type of histamine blocker that helps prevent allergic/infusion reactions to the Taxol.     Paclitaxel (Taxol)     About This Drug Paclitaxel is a drug used to treat cancer. It is given in the vein (IV).  This will take 3 hours7 to infuse.  This first infusion will take longer because the rate is increased slowly every 15 minutes to monitor for reactions.  Your nurse will be in the room with you for the first 15 minutes of the first infusion.   Possible Side Effects    Hair loss. Hair loss is often temporary, although with certain medicine, hair loss can sometimes be permanent. Hair loss may happen suddenly or gradually. If you lose hair, you may lose it from your head, face, armpits, pubic area, chest, and/or legs. You may also notice your hair getting thin.    Swelling of your legs, ankles and/or feet (edema)    Flushing    Nausea and throwing up (vomiting)    Loose bowel movements (diarrhea)    Bone marrow depression. This is a decrease in the number of white blood cells, red blood cells, and platelets. This may raise your risk of infection, make you tired and weak (fatigue), and raise your risk of bleeding.    Effects on the nerves are called peripheral  neuropathy. You may feel numbness, tingling, or pain in your hands and feet. It may be hard for you to button your clothes, open jars, or walk as usual. The effect on the nerves may get worse with more doses of the drug. These effects get better in some people after the drug is stopped but it does not get better in all people.    Changes in your liver function    Bone, joint and muscle pain    Abnormal EKG    Allergic reaction: Allergic reactions, including anaphylaxis are rare but may happen in some patients. Signs of allergic reaction to  this drug may be swelling of the face, feeling like your tongue or throat are swelling, trouble breathing, rash, itching, fever, chills, feeling dizzy, and/or feeling that your heart is beating in a fast or not normal way. If this happens, do not take another dose of this drug. You should get urgent medical treatment.    Infection    Changes in your kidney function.   Note: Each of the side effects above was reported in 20% or greater of patients treated with paclitaxel. Not all possible side effects are included above.   Warnings and Precautions    Severe allergic reactions    Severe bone marrow depression   Treating Side Effects    To help with hair loss, wash with a mild shampoo and avoid washing your hair every day.    Avoid rubbing your scalp, instead, pat your hair or scalp dry    Avoid coloring your hair    Limit your use of hair spray, electric curlers, blow dryers, and curling irons.    If you are interested in getting a wig, talk to your nurse. You can also call the [facility] at 800-ACS-2345 to find out information about the "Look Good, Feel Better" program close to where you live. It is a free program where women getting chemotherapy can learn about wigs, turbans and scarves as well as makeup techniques and skin and nail care.    Ask your doctor or nurse about medicines that are available to help stop or lessen diarrhea and/or nausea.    To help with nausea and vomiting, eat small, frequent meals instead of three large meals a day. Choose foods and drinks that are at room temperature. Ask your nurse or doctor about other helpful tips and medicine that is available to help or stop lessen these symptoms.    If you get diarrhea, eat low-fiber foods that are high in protein and calories and avoid foods that can irritate your digestive tracts or lead to cramping. Ask your nurse or doctor about medicine that can lessen or stop your diarrhea.    Mouth care is very  important. Your mouth care should consist of routine, gentle cleaning of your teeth or dentures and rinsing your mouth with a mixture of 1/2 teaspoon of salt in 8 ounces of water or  teaspoon of baking soda in 8 ounces of water. This should be done at least after each meal and at bedtime.    If you have mouth sores, avoid mouthwash that has alcohol. Also avoid alcohol and smoking because they can bother your mouth and throat.    Drink plenty of fluids (a minimum of eight glasses per day is recommended).    Take your temperature as your doctor or nurse tells you, and whenever you feel like you may have a fever.    Talk to your doctor  or nurse about precautions you can take to avoid infections and bleeding.    Be careful when cooking, walking, and handling sharp objects and hot liquids.   Food and Drug Interactions    There are no known interactions of paclitaxel with food.    This drug may interact with other medicines. Tell your doctor and pharmacist about all the medicines and dietary supplements (vitamins, minerals, herbs and others) that you are taking at this time.    The safety and use of dietary supplements and alternative diets are often not known. Using these might affect your cancer or interfere with your treatment. Until more is known, you should not use dietary supplements or alternative diets without your cancer doctor's help.   When to Call the Doctor   Call your doctor or nurse if you have any of the following symptoms and/or any new or unusual symptoms:    Fever of 100.4 F (38 C) or above    Chills    Redness, pain, warmth, or swelling at the IV site during the infusion    Signs of allergic reaction: swelling of the face, feeling like your tongue or throat are swelling, trouble breathing, rash, itching, fever, chills, feeling dizzy, and/or feeling that your heart is beating in a fast or not normal way    Feeling that your heart is beating in a fast or not normal way  (palpitations)    Weight gain of 5 pounds in one week (fluid retention)    Decreased urine or very dark urine    Signs of liver problems: dark urine, pale bowel movements, bad stomach pain, feeling very tired and weak, unusual  itching, or yellowing of the eyes or skin    Heavy menstrual period that lasts longer than normal    Easy bruising or bleeding    Nausea that stops you from eating or drinking, and/or that is not relieved by prescribed medicines.    Loose bowel movements (diarrhea) more than 4 times a day or diarrhea with weakness or lightheadedness    Pain in your mouth or throat that makes it hard to eat or drink    Lasting loss of appetite or rapid weight loss of five pounds in a week    Signs of peripheral neuropathy: numbness, tingling, or decreased feeling in fingers or toes; trouble walking or changes in the way you walk; or feeling clumsy when buttoning clothes, opening jars, or other routine activities    Joint and muscle pain that is not relieved by prescribed medicines    Extreme fatigue that interferes with normal activities    While you are getting this drug, please tell your nurse right away if you have any pain, redness, or swelling at the site of the IV infusion.    If you think you are pregnant.   Reproduction Warnings    Pregnancy warning: This drug may have harmful effects on the unborn child, it is recommended that effective methods of birth control should be used during your cancer treatment. Let your doctor know right away if you think you may be pregnant.    Breast feeding warning: Women should not breast feed during treatment because this drug could enter the breastmilk and cause harm to a breast feeding baby.     Carboplatin (Paraplatin, CBDCA)   About This Drug   Carboplatin is used to treat cancer. It is given in the vein (IV).  It will take 30 minutes to infuse.    Possible Side  Effects    Bone marrow suppression. This is a decrease  in the number of white blood cells, red blood cells, and platelets. This may raise your risk of infection, make you tired and weak (fatigue), and raise your risk of bleeding.    Nausea and vomiting (throwing up)    Weakness    Changes in your liver function    Changes in your kidney function    Electrolyte changes    Pain   Note: Each of the side effects above was reported in 20% or greater of patients treated with carboplatin. Not all possible side effects are included above.     Warnings and Precautions    Severe bone marrow suppression    Allergic reactions, including anaphylaxis are rare but may happen in some patients. Signs of allergic reaction to this drug may be swelling of the face, feeling like your tongue or throat are swelling, trouble breathing, rash, itching, fever, chills, feeling dizzy, and/or feeling that your heart is beating in a fast or not normal way. If this happens, do not take another dose of this drug. You should get urgent medical treatment.    Severe nausea and vomiting    Effects on the nerves are called peripheral neuropathy. This risk is increased if you are over the age of 85 or if you have received other medicine with risk of peripheral neuropathy. You may feel numbness, tingling, or pain in your hands and feet. It may be hard for you to button your clothes, open jars, or walk as usual. The effect on the nerves may get worse with more doses of the drug. These effects get better in some people after the drug is stopped but it does not get better in all people.    Blurred vision, loss of vision or other changes in eyesight    Decreased hearing    - Skin and tissue irritation including redness, pain, warmth, or swelling at the IV site if the drug leaks out of the vein and into nearby tissue.    Severe changes in your kidney function, which can cause kidney failure    Severe changes in your liver function, which can cause liver failure   Note: Some of  the side effects above are very rare. If you have concerns and/or questions, please discuss them with your medical team.     Important Information    This drug may be present in the saliva, tears, sweat, urine, stool, vomit, semen, and vaginal secretions. Talk to your doctor and/or your nurse about the necessary precautions to take during this time.     Treating Side Effects    Manage tiredness by pacing your activities for the day.    Be sure to include periods of rest between energy-draining activities.    To decrease the risk of infection, wash your hands regularly.    Avoid close contact with people who have a cold, the flu, or other infections.    Take your temperature as your doctor or nurse tells you, and whenever you feel like you may have a fever.    To help decrease the risk of bleeding, use a soft toothbrush. Check with your nurse before using dental floss.    Be very careful when using knives or tools.    Use an electric shaver instead of a razor.    Drink plenty of fluids (a minimum of eight glasses per day is recommended).    If you throw up or have  loose bowel movements, you should drink more fluids so that you do not become dehydrated (lack of water in the body from losing too much fluid).    To help with nausea and vomiting, eat small, frequent meals instead of three large meals a day. Choose foods and drinks that are at room temperature. Ask your nurse or doctor about other helpful tips and medicine that is available to help stop or lessen these symptoms.    If you have numbness and tingling in your hands and feet, be careful when cooking, walking, and handling sharp objects and hot liquids.    Keeping your pain under control is important to your well-being. Please tell your doctor or nurse if you are experiencing pain.     Food and Drug Interactions    There are no known interactions of carboplatin with food.    This drug may interact with other medicines.  Tell your doctor and pharmacist about all the prescription and over-the-counter medicines and dietary supplements (vitamins, minerals, herbs and others) that you are taking at this time. Also, check with your doctor or pharmacist before starting any new prescription or over-the-counter medicines, or dietary supplements to make sure that there are no interactions.     When to Call the Doctor   Call your doctor or nurse if you have any of these symptoms and/or any new or unusual symptoms:    Fever of 100.4 F (38 C) or higher    Chills    Tiredness that interferes with your daily activities    Feeling dizzy or lightheaded    Easy bleeding or bruising    Nausea that stops you from eating or drinking and/or is not relieved by prescribed medicines    Throwing up    Blurred vision or other changes in eyesight    Decrease in hearing or ringing in the ear    Signs of allergic reaction: swelling of the face, feeling like your tongue or throat are swelling, trouble breathing, rash, itching, fever, chills, feeling dizzy, and/or feeling that your heart is beating in a fast or not normal way. If this happens, call 911 for emergency care.    Signs of possible liver problems: dark urine, pale bowel movements, bad stomach pain, feeling very tired and weak, unusual itching, or yellowing of the eyes or skin    Decreased urine, or very dark urine    Numbness, tingling, or pain in your hands and feet    Pain that does not go away or is not relieved by prescribed medicine    While you are getting this drug, please tell your nurse right away if you have any pain, redness, or swelling at the site of the IV infusion, or if you have any new onset of symptoms, or if you just feel "different" from before when the infusion was started.     Reproduction Warnings    Pregnancy warning: This drug may have harmful effects on the unborn baby. Women of child bearing potential should use effective methods of  birth control during your cancer treatment. Let your doctor know right away if you think you may be pregnant.    Breastfeeding warning: It is not known if this drug passes into breast milk. For this reason, women should not breastfeed during treatment because this drug could enter the breast milk and cause harm to a breastfeeding baby.    Fertility warning: Human fertility studies have not been done with this drug. Talk with your doctor  or nurse if you plan to have children. Ask for information on sperm or egg banking.     Pembrolizumab Beryle Flock)   About This Drug Pembrolizumab is used to treat cancer. It is given in the vein (IV).  This drug will take 30 minutes to infuse.   Possible Side Effects  Tiredness  Fever  Nausea  Decreased appetite (decreased hunger)  Loose bowel movements (diarrhea)  Constipation (not able to move bowels)  Trouble breathing  Rash  Itching  Muscle and bone pain  Cough   Note: Each of the side effects above was reported in 20% or greater of patients treated with pembrolizumab. Not all possible side effects are included above.   Warnings and Precautions    This drug works with your immune system and can cause inflammation in any of your organs and tissues and can change how they work. This may put you at risk for developing serious medical problems which can very rarely be fatal.    Colitis (swelling or inflammation in the colon) - symptoms are loose bowel movements (diarrhea) stomach cramping, and sometimes blood in the stool    Changes in liver function. Your liver function will be checked as needed.    Changes in kidney function, which can very rarely be fatal. Your kidney function will be checked as needed.    Inflammation (swelling) of the lungs which can very rarely be fatal - you may have a dry cough or trouble breathing.    This drug may affect some of your hormone glands (especially the thyroid, adrenals, pituitary and pancreas). Your  hormone levels will be checked as needed.    Blood sugar levels may change and you may develop diabetes. If you already have diabetes, changes may need to be made to your diabetes medication.    Severe allergic skin reaction, which can very rarely be fatal. You may develop blisters on your skin that are filled with fluid or a severe red rash all over your body that may be painful.    Increased risk of organ rejection in patients who have received donor organs    Increased risk of complications in patients who will undergo a stem cell transplant after receiving pembrolizumab.    While you are getting this drug in your vein (IV), you may have a reaction to the drug. Your nurse will check you closely for these signs: fever or shaking chills, flushing, facial swelling, feeling dizzy, headache, trouble breathing, rash, itching, chest tightness, or chest pain. These reactions may occur after your infusion. If this happens, call 911 for emergency care.   Important Information This drug may be present in the saliva, tears, sweat, urine, stool, vomit, semen, and vaginal secretions. Talk to your doctor and/or your nurse about the necessary precautions to take during this time.   Treating Side Effects    Ask your doctor or nurse about medicines that are available to help stop or lessen constipation, diarrhea and/or nausea.    Drink plenty of fluids (a minimum of eight glasses per day is recommended).    If you are not able to move your bowels, check with your doctor or nurse before you use any enemas, laxatives, or suppositories    To help with nausea and vomiting, eat small, frequent meals instead of three large meals a day. Choose foods and drinks that are at room temperature. Ask your nurse or doctor about other helpful tips and medicine that is available to help or stop lessen these  symptoms.    If you get diarrhea, eat low-fiber foods that are high in protein and calories and avoid foods that can  irritate your digestive tracts or lead to cramping. Ask your nurse or doctor about medicine that can lessen or stop your diarrhea.    Manage tiredness by pacing your activities for the day. Be sure to include periods of rest between energy-draining activities    Keeping your pain under control is important to your wellbeing. Please tell your doctor or nurse if you are experiencing pain.    If you have diabetes, keep good control of your blood sugar level. Tell your nurse or your doctor if your glucose levels are higher or lower than normal    If you get a rash do not put anything on it unless your doctor or nurse says you may. Keep the area around the rash clean and dry. Ask your doctor for medicine if your rash bothers you.    Infusion reactions may happen for 24 hours after your infusion. If this happens, call 911 for emergency care.   Food and Drug Interactions    There are no known interactions of pembrolizumab with food.    There are no known interactions of pembrolizumab with other medications.    Tell your doctor and pharmacist about all the medicines and dietary supplements (vitamins, minerals, herbs and others) that you are taking at this time. The safety and use of dietary supplements and alternative agents are often not known. Using these might affect your cancer or interfere with your treatment. Until more is known, you should not use dietary supplements or alternative agents without your cancer doctor's help.     When to Call the Doctor Call your doctor or nurse if you have any of the following symptoms and/or any new or unusual symptoms:    Fever of 100.4 F (38 C) or higher    Chills    Wheezing or trouble breathing    Rash or itching    Feeling dizzy or lightheaded    Loose bowel movements (diarrhea) more than 4 times a day or diarrhea with weakness or lightheadedness, or diarrhea that is not controlled by medications    Nausea that stops you from eating or  drinking, and/or that is not relieved by prescribed medicines    Lasting loss of appetite or rapid weight loss of five pounds in a week    Fatigue that interferes with your daily activities    No bowel movement for 3 days or you feel uncomfortable    Extreme weakness that interferes with normal activities    Bad abdominal pain, especially in upper right area    Decreased urine    Unusual thirst or passing urine often    Rash that is not relieved by prescribed medicines    Flu-like symptoms: fever, headache, muscle and joint aches, and fatigue (low energy, feeling weak)    Signs of liver problems: dark urine, pale bowel movements, bad stomach pain, feeling very tired and weak, unusual itching, or yellowing of the eyes or skin    Signs of infusion reactions such as fever or shaking chills, flushing, facial swelling, feeling dizzy, headache, trouble breathing, rash, itching, chest tightness, or chest pain.     Reproduction Warnings    Pregnancy warning: This drug may have harmful effects on the unborn baby. Women of childbearing potential should use effective methods of birth control during your cancer treatment and for at least 4 months after treatment.  Let your doctor know right away if you think you may be pregnant    Breast feeding warning: It is not known if this drug passes into breast milk. It is recommended that women do not breastfeed during treatment and for 4 months after treatment.    Fertility warning: Human fertility studies have not been done with this drug. Talk with your doctor or nurse if you plan to have children.         SELF CARE ACTIVITIES WHILE RECEIVING CHEMOTHERAPY:   Hydration Increase your fluid intake and drink at least 8 to 12 cups (64 ounces) of water/decaffeinated beverages per day after treatment. You can still have your cup of coffee or soda but these beverages do not count as part of your 8 to 12 cups that you need to drink daily. No alcohol  intake.   Medications Continue taking your normal prescription medication as prescribed.  If you start any new herbal or new supplements please let us know first to make sure it is safe.   Mouth Care Have teeth cleaned professionally before starting treatment. Keep dentures and partial plates clean. Use soft toothbrush and do not use mouthwashes that contain alcohol. Biotene is a good mouthwash that is available at most pharmacies or may be ordered by calling [phone]. Use warm salt water gargles (1 teaspoon salt per 1 quart warm water) before and after meals and at bedtime. If you need dental work, please let the doctor know before you go for your appointment so that we can coordinate the best possible time for you in regards to your chemo regimen. You need to also let your dentist know that you are actively taking chemo. We may need to do labs prior to your dental appointment.   Skin Care Always use sunscreen that has not expired and with SPF (Sun Protection Factor) of 50 or higher. Wear hats to protect your head from the sun. Remember to use sunscreen on your hands, ears, face, & feet.  Use good moisturizing lotions such as udder cream, eucerin, or even Vaseline. Some chemotherapies can cause dry skin, color changes in your skin and nails.     Avoid long, hot showers or baths. Use gentle, fragrance-free soaps and laundry detergent. Use moisturizers, preferably creams or ointments rather than lotions because the thicker consistency is better at preventing skin dehydration. Apply the cream or ointment within 15 minutes of showering. Reapply moisturizer at night, and moisturize your hands every time after you wash them.   Hair Loss (if your doctor says your hair will fall out)   If your doctor says that your hair is likely to fall out, decide before you begin chemo whether you want to wear a wig. You may want to shop before treatment to match your hair color. Hats, turbans, and scarves can  also camouflage hair loss, although some people prefer to leave their heads uncovered. If you go bare-headed outdoors, be sure to use sunscreen on your scalp. Cut your hair short. It eases the inconvenience of shedding lots of hair, but it also can reduce the emotional impact of watching your hair fall out. Don't perm or color your hair during chemotherapy. Those chemical treatments are already damaging to hair and can enhance hair loss. Once your chemo treatments are done and your hair has grown back, it's OK to resume dyeing or perming hair.   With chemotherapy, hair loss is almost always temporary. But when it grows back, it may be a different  color or texture. In older adults who still had hair color before chemotherapy, the new growth may be completely gray.  Often, new hair is very fine and soft.   Infection Prevention Please wash your hands for at least 30 seconds using warm soapy water. Handwashing is the #1 way to prevent the spread of germs. Stay away from sick people or people who are getting over a cold. If you develop respiratory systems such as green/yellow mucus production or productive cough or persistent cough let us know and we will see if you need an antibiotic. It is a good idea to keep a pair of gloves on when going into grocery stores/[employer] to decrease your risk of coming into contact with germs on the carts, etc. Carry alcohol hand gel with you at all times and use it frequently if out in public. If your temperature reaches 100.4 or higher please call the clinic and let us know.  If it is after hours or on the weekend please go to the ER if your temperature is over 100.4.  Please have your own personal thermometer at home to use.     Sex and bodily fluids If you are going to have sex, a condom must be used to protect the person that isn't taking chemotherapy. Chemo can decrease your libido (sex drive). For a few days after chemotherapy, chemotherapy can be excreted through your  bodily fluids.  When using the toilet please close the lid and flush the toilet twice.  Do this for a few day after you have had chemotherapy.    Effects of chemotherapy on your sex life Some changes are simple and won't last long. They won't affect your sex life permanently.   Sometimes you may feel: too tired not strong enough to be very active sick or sore  not in the mood anxious or low   Your anxiety might not seem related to sex. For example, you may be worried about the cancer and how your treatment is going. Or you may be worried about money, or about how you family are coping with your illness.  These things can cause stress, which can affect your interest in sex. It's important to talk to your partner about how you feel.  Remember - the changes to your sex life don't usually last long. There's usually no medical reason to stop having sex during chemo. The drugs won't have any long term physical effects on your performance or enjoyment of sex. Cancer can't be passed on to your partner during sex   Contraception It's important to use reliable contraception during treatment. Avoid getting pregnant while you or your partner are having chemotherapy. This is because the drugs may harm the baby. Sometimes chemotherapy drugs can leave a man or woman infertile.  This means you would not be able to have children in the future. You might want to talk to someone about permanent infertility. It can be very difficult to learn that you may no longer be able to have children. Some people find counselling helpful. There might be ways to preserve your fertility, although this is easier for men than for women. You may want to speak to a fertility expert. You can talk about sperm banking or harvesting your eggs. You can also ask about other fertility options, such as donor eggs. If you have or have had breast cancer, your doctor might advise you not to take the contraceptive pill. This is because the hormones  in it might affect  the cancer. It is not known for sure whether or not chemotherapy drugs can be passed on through semen or secretions from the vagina. Because of this some doctors advise people to use a barrier method if you have sex during treatment. This applies to vaginal, anal or oral sex. Generally, doctors advise a barrier method only for the time you are actually having the treatment and for about a week after your treatment. Advice like this can be worrying, but this does not mean that you have to avoid being intimate with your partner. You can still have close contact with your partner and continue to enjoy sex.   Animals If you have cats or birds we just ask that you not change the litter or change the cage.  Please have someone else do this for you while you are on chemotherapy.    Food Safety During and After Cancer Treatment Food safety is important for people both during and after cancer treatment. Cancer and cancer treatments, such as chemotherapy, radiation therapy, and stem cell/bone marrow transplantation, often weaken the immune system. This makes it harder for your body to protect itself from foodborne illness, also called food poisoning. Foodborne illness is caused by eating food that contains harmful bacteria, parasites, or viruses.   Foods to avoid Some foods have a higher risk of becoming tainted with bacteria. These include: Unwashed fresh fruit and vegetables, especially leafy vegetables that can hide dirt and other contaminants Raw sprouts, such as alfalfa sprouts Raw or undercooked beef, especially ground beef, or other raw or undercooked meat and poultry Fatty, fried, or spicy foods immediately before or after treatment.  These can sit heavy on your stomach and make you feel nauseous. Raw or undercooked shellfish, such as oysters. Sushi and sashimi, which often contain raw fish.  Unpasteurized beverages, such as unpasteurized fruit juices, raw milk, raw yogurt, or  cider Undercooked eggs, such as soft boiled, over easy, and poached; raw, unpasteurized eggs; or foods made with raw egg, such as homemade raw cookie dough and homemade mayonnaise   Simple steps for food safety   Shop smart. Do not buy food stored or displayed in an unclean area. Do not buy bruised or damaged fruits or vegetables. Do not buy cans that have cracks, dents, or bulges. Pick up foods that can spoil at the end of your shopping trip and store them in a cooler on the way home.   Prepare and clean up foods carefully. Rinse all fresh fruits and vegetables under running water, and dry them with a clean towel or paper towel. Clean the top of cans before opening them. After preparing food, wash your hands for 20 seconds with hot water and soap. Pay special attention to areas between fingers and under nails. Clean your utensils and dishes with hot water and soap. Disinfect your kitchen and cutting boards using 1 teaspoon of liquid, unscented bleach mixed into 1 quart of water.     Dispose of old food. Eat canned and packaged food before its expiration date (the "use by" or "best before" date). Consume refrigerated leftovers within 3 to 4 days. After that time, throw out the food. Even if the food does not smell or look spoiled, it still may be unsafe. Some bacteria, such as Listeria, can grow even on foods stored in the refrigerator if they are kept for too long.   Take precautions when eating out. At restaurants, avoid buffets and salad bars where food sits out for a long  time and comes in contact with many people. Food can become contaminated when someone with a virus, often a norovirus, or another "bug" handles it. Put any leftover food in a "to-go" container yourself, rather than having the server do it. And, refrigerate leftovers as soon as you get home. Choose restaurants that are clean and that are willing to prepare your food as you order it cooked.     AT HOME MEDICATIONS:                                                                                                                                                                  Compazine/Prochlorperazine 10mg  tablet. Take 1 tablet every 6 hours as needed for nausea/vomiting. (This can make you sleepy)     EMLA cream. Apply a quarter size amount to port site 1 hour prior to chemo. Do not rub in. Cover with plastic wrap.       Diarrhea Sheet    If you are having loose stools/diarrhea, please purchase Imodium and begin taking as outlined:  At the first sign of poorly formed or loose stools you should begin taking Imodium (loperamide) 2 mg capsules.  Take two tablets (4mg ) followed by one tablet (2mg ) every 2 hours - DO NOT EXCEED 8 tablets in 24 hours.  If it is bedtime and you are having loose stools, take 2 tablets at bedtime, then 2 tablets every 4 hours until morning.    Always call the [facility] if you are having loose stools/diarrhea that you can't get under control.  Loose stools/diarrhea leads to dehydration (loss of water) in your body.  We have other options of trying to get the loose stools/diarrhea to stop but you must let us know!     Constipation Sheet   Colace - 100 mg capsules - take 2 capsules daily.  If this doesn't help then you can increase to 2 capsules twice daily.  Please call if the above does not work for you. Do not go more than 2 days without a bowel movement.  It is very important that you do not become constipated.  It will make you feel sick to your stomach (nausea) and can cause abdominal pain and vomiting.   Nausea Sheet    Compazine/Prochlorperazine 10mg  tablet. Take 1 tablet every 6 hours as needed for nausea/vomiting (This can make you drowsy).   If you are having persistent nausea (nausea that does not stop) please call the [facility] and let us know the amount of nausea that you are experiencing.  If you begin to vomit, you need to call the [place] and if it is the  weekend and you have vomited more than one time and can't get it to stop-go to the Emergency Room.  Persistent nausea/vomiting can lead to dehydration (loss of fluid in your body) and will make you feel very weak and unwell. Ice chips, sips of clear liquids, foods that are at room temperature, crackers, and toast tend to be better tolerated.     SYMPTOMS TO REPORT AS SOON AS POSSIBLE AFTER TREATMENT:   FEVER GREATER THAN 100.4 F   CHILLS WITH OR WITHOUT FEVER   NAUSEA AND VOMITING THAT IS NOT CONTROLLED WITH YOUR NAUSEA MEDICATION   UNUSUAL SHORTNESS OF BREATH   UNUSUAL BRUISING OR BLEEDING   TENDERNESS IN MOUTH AND THROAT WITH OR WITHOUT PRESENCE OF ULCERS   URINARY PROBLEMS   BOWEL PROBLEMS   UNUSUAL RASH         Wear comfortable clothing and clothing appropriate for easy access to any Portacath or PICC line. Let us know if there is anything that we can do to make your therapy better!       What to do if you need assistance after hours or on the weekends: CALL [phone].  HOLD on the line, do not hang up.  You will hear multiple messages but at the end you will be connected with a nurse triage line.  They will contact the doctor if necessary.  Most of the time they will be able to assist you.  Do not call the hospital operator.         I have been informed and understand all of the instructions given to me and have received a copy. I have been instructed to call the clinic [phone] or my family physician as soon as possible for continued medical care, if indicated. I do not have any more questions at this time but understand that I may call the [place] or the Patient Navigator at [phone] during office hours should I have questions or need assistance in obtaining follow-up care.

## 2022-01-11 ENCOUNTER — Other Ambulatory Visit: Payer: Self-pay | Admitting: Student

## 2022-01-11 DIAGNOSIS — C3492 Malignant neoplasm of unspecified part of left bronchus or lung: Secondary | ICD-10-CM

## 2022-01-13 NOTE — H&P (Signed)
Chief Complaint: Patient was seen in consultation today for non-small cell lung cancer  Referring Physician(s): [place]  Supervising Physician: Jacqulynn Cadet  Patient Status: [facility] - Out-pt  History of Present Illness: Alfred Brown is a 71 y.o. male with past medical history of depression/anxiety, COPD, CLL (diagnosed 2010, on surveillance) and newly identified left upper lobe lung masses with metastasis.  He is recently known to IR from bone lesion biopsy which confirmed metastatic non-small cell lung cancer.  He is scheduled for chemotherapy and is in need of durable venous access.   Patient presents to [facility] Radiology in his usual state of health today.  He has been NPO.  He is aware of the goals of hte procedure and is agreeable to proceed.  His wife, Alfred Brown, is available for care and support at home.   Past Medical History:  Diagnosis Date   Aortic valve disorder    Mild insufficiency   Chronic lymphocytic leukemia (Alfred Brown)    01/2012: WBC-90.2, H&H-15.1/45.3, platelets-185 03/31/12: Verified with Dr. Tressie Stalker that no precautions or modification of medical regime are required prior to orthopaedic surgery.    CLL (chronic lymphocytic leukemia) (HCC)    CMV (cytomegalovirus) (HCC)    COPD (chronic obstructive pulmonary disease) (HCC)    Depression with anxiety    DJD (degenerative joint disease)    GERD (gastroesophageal reflux disease)    Hyperlipidemia    Hypogammaglobulinemia ([place]) 09/23/2019   Lipoma    left chest wall   Palpitations 2004   PVCs; borderline stress nuclear in 2004, negative in 2007; Echo 2007; AV-sclerotic, very mild AI   Pneumonia    Right bundle branch block    + left posterior fascicular block   Tobacco abuse    80 pack years    Past Surgical History:  Procedure Laterality Date   COLONOSCOPY  01/31/2012   Negative screening study   GANGLION CYST EXCISION  04/02/1995   left wrist   LIPOMA EXCISION Left 10/11/2021   chest wall    ROTATOR CUFF REPAIR  04/02/1995   right   SHOULDER ARTHROSCOPY WITH SUBACROMIAL DECOMPRESSION  04/09/2012   Procedure: SHOULDER ARTHROSCOPY WITH SUBACROMIAL DECOMPRESSION;  Surgeon: Ninetta Lights, MD;  Location: [place];  Service: Orthopedics;  Laterality: Left;  LEFT SHOULDER ARTHROSCOPY, SUBACROMIAL DECOMPRESSION, PARTIAL ACROMIOPLASTY WITH CORACOACROMIAL RELEASE, DISTAL CLAVICULECTOMY WITH ROTATOR CUFF REPAIR, DEBRIDEMENT OF LABRUM    Allergies: Patient has no known allergies.  Medications: Prior to Admission medications   Medication Sig Start Date End Date Taking? Authorizing Provider  acetaminophen (TYLENOL) 500 MG tablet Take 500-1,000 mg by mouth every 6 (six) hours as needed for moderate pain.   Yes [provider]  albuterol (VENTOLIN HFA) 108 (90 Base) MCG/ACT inhaler 2 puffs every 4 hours as needed if you can't catch your breath 12/04/21  Yes Tanda Rockers, MD  ALPRAZolam Duanne Moron) 1 MG tablet Take 1 mg by mouth 3 (three) times daily as needed for anxiety.    Yes [provider]  aspirin 325 MG tablet Take 325 mg by mouth daily.   Yes [provider]  buPROPion (WELLBUTRIN SR) 150 MG 12 hr tablet Take 150 mg by mouth daily.   Yes [provider]  Calcium Carb-Cholecalciferol (CALCIUM 600 + D PO) Take 1 tablet by mouth daily.   Yes [provider]  celecoxib (CELEBREX) 100 MG capsule Take 100 mg by mouth 2 (two) times daily.   Yes [provider]  clotrimazole-betamethasone (LOTRISONE) cream  Apply 1 application  topically daily as needed (rash). 06/19/15  Yes [provider]  diltiazem (CARDIZEM) 30 MG tablet Take 30 mg by mouth as needed.   Yes [provider]  fish oil-omega-3 fatty acids 1000 MG capsule Take 1 g by mouth daily.   Yes [provider]  fluticasone (FLONASE) 50 MCG/ACT nasal spray Place 2 sprays into the nose daily.   Yes [provider]  lidocaine (LIDODERM) 5  % Place 1 patch onto the skin daily. Remove and Discard patch within 12 hours. Patient taking differently: Place 1 patch onto the skin daily as needed (pain). Remove and Discard patch within 12 hours. 11/28/21  Yes Derek Jack, MD  Loratadine 10 MG CAPS Take 10 mg by mouth daily.   Yes [provider]  lovastatin (MEVACOR) 20 MG tablet Take 20 mg by mouth daily. 09/29/14  Yes [provider]  metoprolol tartrate (LOPRESSOR) 25 MG tablet Take 1 tablet (25 mg total) by mouth 2 (two) times daily. Patient taking differently: Take 37.5 mg by mouth 2 (two) times daily. 12/20/21  Yes Tat, Shanon Brow, MD  Multiple Vitamin (MULTIVITAMIN) tablet Take 1 tablet by mouth daily.   Yes [provider]  niacin (NIASPAN) 500 MG CR tablet Take 500 mg by mouth daily. 09/29/14  Yes [provider]  oxyCODONE (OXYCONTIN) 20 mg 12 hr tablet Take 1 tablet (20 mg total) by mouth every 12 (twelve) hours. 01/07/22  Yes Derek Jack, MD  Oxycodone HCl 10 MG TABS Take 1 tablet (10 mg total) by mouth every 4 (four) hours as needed. 12/26/21  Yes Derek Jack, MD  polyethylene glycol (MIRALAX / GLYCOLAX) 17 g packet Take 17 g by mouth daily as needed for moderate constipation.   Yes [provider]  prochlorperazine (COMPAZINE) 5 MG tablet Take 1 tablet (5 mg total) by mouth every 6 (six) hours as needed for nausea or vomiting. 12/20/21  Yes Tat, Shanon Brow, MD  sennosides-docusate sodium (SENOKOT-S) 8.6-50 MG tablet Take 2 tablets by mouth 2 (two) times daily.   Yes [provider]  valACYclovir (VALTREX) 500 MG tablet Take 500 mg by mouth daily.   Yes [provider]  zolpidem (AMBIEN) 10 MG tablet Take 10 mg by mouth at bedtime as needed for sleep. 12/24/21  Yes [provider]     Family History  Problem Relation Age of Onset   Stroke Mother    Heart attack Father    Cancer Sister        colon   Colon cancer Sister    Cancer Brother         2 brothers died with lung cancer    Social History   Socioeconomic History   Marital status: Married    Spouse name: Not on file   Number of children: Not on file   Years of education: Not on file   Highest education level: Not on file  Occupational History   Not on file  Tobacco Use   Smoking status: Every Day    Packs/day: 1.00    Years: 45.00    Total pack years: 45.00    Types: Cigarettes    Start date: 12/03/1963   Smokeless tobacco: Never   Tobacco comments:    Pt states he smokes almost a whole pack daily. 12/04/21  Vaping Use   Vaping Use: Never used  Substance and Sexual Activity   Alcohol use: No    Alcohol/week: 0.0 standard drinks of alcohol  Drug use: No   Sexual activity: Not on file  Other Topics Concern   Not on file  Social History Narrative   Not on file   Social Determinants of Health   Financial Resource Strain: Not on file  Food Insecurity: No Food Insecurity (12/18/2021)   Hunger Vital Sign    Worried About Running Out of Food in the Last Year: Never true    Ran Out of Food in the Last Year: Never true  Transportation Needs: No Transportation Needs (12/18/2021)   PRAPARE - Hydrologist (Medical): No    Lack of Transportation (Non-Medical): No  Physical Activity: Not on file  Stress: Not on file  Social Connections: Not on file     Review of Systems: A 12 point ROS discussed and pertinent positives are indicated in the HPI above.  All other systems are negative.  Review of Systems  Constitutional:  Negative for fatigue and fever.  Respiratory:  Negative for cough and shortness of breath.   Cardiovascular:  Positive for chest pain (chest wall pain, right side).  Musculoskeletal:  Negative for back pain.  Psychiatric/Behavioral:  Negative for behavioral problems and confusion.     Vital Signs: BP 130/74   Pulse 99   Temp 97.9 F (36.6 C) (Oral)   Resp 17   Ht 6\' 1"  (1.854 m)   Wt 150 lb (68 kg)   SpO2  97%   BMI 19.79 kg/m   Physical Exam Vitals and nursing note reviewed.  Constitutional:      General: He is not in acute distress.    Appearance: Normal appearance. He is not ill-appearing.  HENT:     Mouth/Throat:     Mouth: Mucous membranes are moist.     Pharynx: Oropharynx is clear.  Cardiovascular:     Rate and Rhythm: Normal rate and regular rhythm.  Pulmonary:     Effort: Pulmonary effort is normal.     Breath sounds: Normal breath sounds.  Abdominal:     General: Abdomen is flat.     Palpations: Abdomen is soft.  Musculoskeletal:     Cervical back: Normal range of motion and neck supple.  Skin:    General: Skin is warm and dry.     Comments: Radiation tattoo noted to lower chest wall, right  Neurological:     General: No focal deficit present.     Mental Status: He is alert and oriented to person, place, and time. Mental status is at baseline.  Psychiatric:        Mood and Affect: Mood normal.        Behavior: Behavior normal.        Thought Content: Thought content normal.        Judgment: Judgment normal.      MD Evaluation Airway: WNL Heart: WNL Abdomen: WNL Chest/ Lungs: WNL ASA  Classification: 3 Mallampati/Airway Score: Two   Imaging: CT ABDOMEN PELVIS W CONTRAST  Result Date: 12/18/2021 CLINICAL DATA:  Nausea, vomiting EXAM: CT ABDOMEN AND PELVIS WITH CONTRAST TECHNIQUE: Multidetector CT imaging of the abdomen and pelvis was performed using the standard protocol following bolus administration of intravenous contrast. RADIATION DOSE REDUCTION: This exam was performed according to the departmental dose-optimization program which includes automated exposure control, adjustment of the mA and/or kV according to patient size and/or use of iterative reconstruction technique. CONTRAST:  174mL OMNIPAQUE IOHEXOL 300 MG/ML  SOLN COMPARISON:  CT done on 12/07/2015, PET-CT done on 11/22/2021  FINDINGS: Lower chest: Visualized lower lung fields are unremarkable.  There are scattered coronary artery calcifications. Hepatobiliary: There is fatty infiltration in liver. No focal abnormalities are seen. Gallbladder is unremarkable. Pancreas: No focal abnormalities are seen. Spleen: Spleen measures 15.1 cm in length. Adrenals/Urinary Tract: Adrenals are unremarkable. There is no hydronephrosis. There are no renal or ureteral stones. There are a few subcentimeter low-density foci in renal cortex, possibly cysts. Urinary bladder is not distended. Stomach/Bowel: There is prominence of mucosal folds in stomach. There is no significant distention of stomach. There is no significant small bowel dilation. Appendix is not dilated. Multiple diverticula are seen in colon. There is no evidence of focal acute diverticulitis. Vascular/Lymphatic: Calcifications are seen in abdominal aorta and its major branches. There are slightly enlarged lymph nodes in mesentery and retroperitoneum. Largest of the nodes is noted close to porta bodies measuring 11 mm in short axis. Reproductive: Prostate is enlarged with inhomogeneous attenuation. Other: There is no ascites or pneumoperitoneum. Musculoskeletal: There is a large lytic lesion measuring up to 6 cm in diameter in right iliac bone. Spondylolysis is seen in L4 vertebra. There is first-degree spondylolisthesis at the L4-L5 level. There is encroachment of neural foramina from L3-S1 levels. IMPRESSION: There is no evidence of intestinal obstruction or pneumoperitoneum. There is no hydronephrosis. Appendix is not dilated. There is prominence of mucosal folds in the stomach which may be due to incomplete distention or suggest gastritis. Diverticulosis of colon without signs of focal diverticulitis. There are slightly enlarged lymph nodes measuring up to 11 mm in short axis adjacent to body bodies, retroperitoneum and mesentery. Fatty liver. Enlarged spleen. Enlarged prostate. There is a large lytic lesion in the right iliac bone suggesting metastatic  disease. Lumbar spondylosis. Other findings as described in the body of the report. Electronically Signed   By: Elmer Picker M.D.   On: 12/18/2021 11:50   DG Chest Portable 1 View  Result Date: 12/18/2021 CLINICAL DATA:  Chest pain. EXAM: PORTABLE CHEST 1 VIEW COMPARISON:  November 21, 2021.  November 07, 2021. FINDINGS: The heart size and mediastinal contours are within normal limits. Right lung is clear. Known left upper lobe pulmonary nodule is again noted concerning for malignancy. The visualized skeletal structures are unremarkable. IMPRESSION: Known left upper lobe pulmonary nodule is again noted concerning for malignancy. No other abnormality is noted. Electronically Signed   By: Marijo Conception M.D.   On: 12/18/2021 08:54    Labs:  CBC: Recent Labs    12/06/21 0620 12/18/21 0937 12/18/21 1542 01/07/22 1255  WBC 186.8* 185.7* 174.9* 94.2*  HGB 12.8* 13.1 13.2 11.8*  HCT 42.9 42.2 41.6 38.4*  PLT 213 270 258 111*    COAGS: Recent Labs    12/06/21 0620  INR 1.1    BMP: Recent Labs    12/18/21 0937 12/18/21 1542 12/19/21 0451 12/20/21 0331 01/07/22 1255  NA 136  --  135 133* 136  K 3.7  --  4.0 3.8 5.3*  CL 101  --  103 104 101  CO2 24  --  24 23 28   GLUCOSE 123*  --  121* 125* 122*  BUN 26*  --  20 22 25*  CALCIUM 9.2  --  8.9 8.7* 9.0  CREATININE 0.80 0.77 0.76 0.79 1.14  GFRNONAA >60 >60 >60 >60 >60    LIVER FUNCTION TESTS: Recent Labs    06/04/21 1244 12/18/21 0937 01/07/22 1255  BILITOT 0.4 1.0 0.8  AST 19 15 18  ALT 14 19 40  ALKPHOS 126 96 101  PROT 6.6 6.3* 6.1*  ALBUMIN 4.2 3.5 3.0*    TUMOR MARKERS: No results for input(s): "AFPTM", "CEA", "CA199", "CHROMGRNA" in the last 8760 hours.  Assessment and Plan: Patient with past medical history of COPD, CLL (on surveillance) presents with complaint of newly diagnosed non-small cell lung cancer.  IR consulted for Port-A-Cath placement at the request of Dr. Delton Coombes. Case reviewed by Dr.  Laurence Ferrari who approves patient for procedure.  Patient presents today in their usual state of health.  He has been NPO and is not currently on blood thinners.   Risks and benefits of image guided port-a-catheter placement was discussed with the patient including, but not limited to bleeding, infection, pneumothorax, or fibrin sheath development and need for additional procedures.  All of the patient's questions were answered, patient is agreeable to proceed. Consent signed and in chart.   Thank you for this interesting consult.  I greatly enjoyed meeting Buzz J Hulse and look forward to participating in their care.  A copy of this report was sent to the requesting provider on this date.  Electronically Signed: Docia Barrier, PA 01/14/2022, 8:19 AM   I spent a total of  30 Minutes   in face to face in clinical consultation, greater than 50% of which was counseling/coordinating care for non-small cell lung cancer.

## 2022-01-14 ENCOUNTER — Other Ambulatory Visit: Payer: Self-pay

## 2022-01-14 ENCOUNTER — Ambulatory Visit (HOSPITAL_COMMUNITY)
Admission: RE | Admit: 2022-01-14 | Discharge: 2022-01-14 | Disposition: A | Discharge status: Home / Self Care | Payer: Medicare Other | Source: Ambulatory Visit | Attending: Hematology | Admitting: Hematology

## 2022-01-14 ENCOUNTER — Inpatient Hospital Stay: Payer: Medicare Other | Admitting: Dietician

## 2022-01-14 ENCOUNTER — Other Ambulatory Visit: Payer: Self-pay | Admitting: Hematology

## 2022-01-14 ENCOUNTER — Ambulatory Visit: Payer: Medicare Other | Admitting: Hematology

## 2022-01-14 ENCOUNTER — Telehealth: Payer: Self-pay | Admitting: Dietician

## 2022-01-14 ENCOUNTER — Encounter (HOSPITAL_COMMUNITY): Payer: Self-pay

## 2022-01-14 DIAGNOSIS — J449 Chronic obstructive pulmonary disease, unspecified: Secondary | ICD-10-CM | POA: Diagnosis not present

## 2022-01-14 DIAGNOSIS — F32A Depression, unspecified: Secondary | ICD-10-CM | POA: Diagnosis not present

## 2022-01-14 DIAGNOSIS — C3492 Malignant neoplasm of unspecified part of left bronchus or lung: Secondary | ICD-10-CM

## 2022-01-14 DIAGNOSIS — Z452 Encounter for adjustment and management of vascular access device: Secondary | ICD-10-CM | POA: Diagnosis not present

## 2022-01-14 DIAGNOSIS — F419 Anxiety disorder, unspecified: Secondary | ICD-10-CM | POA: Insufficient documentation

## 2022-01-14 DIAGNOSIS — C3412 Malignant neoplasm of upper lobe, left bronchus or lung: Secondary | ICD-10-CM | POA: Diagnosis not present

## 2022-01-14 DIAGNOSIS — F17210 Nicotine dependence, cigarettes, uncomplicated: Secondary | ICD-10-CM | POA: Insufficient documentation

## 2022-01-14 DIAGNOSIS — C3490 Malignant neoplasm of unspecified part of unspecified bronchus or lung: Secondary | ICD-10-CM | POA: Insufficient documentation

## 2022-01-14 HISTORY — PX: IR IMAGING GUIDED PORT INSERTION: IMG5740

## 2022-01-14 MED ORDER — LIDOCAINE-EPINEPHRINE 1 %-1:100000 IJ SOLN
INTRAMUSCULAR | Status: AC
  Administered 2022-01-14: 15 mL
  Filled 2022-01-14: qty 1

## 2022-01-14 MED ORDER — MIDAZOLAM HCL 2 MG/2ML IJ SOLN
INTRAMUSCULAR | Status: AC
  Filled 2022-01-14: qty 4

## 2022-01-14 MED ORDER — FENTANYL CITRATE (PF) 100 MCG/2ML IJ SOLN
INTRAMUSCULAR | Status: AC | PRN
  Administered 2022-01-14 (×3): 25 ug via INTRAVENOUS

## 2022-01-14 MED ORDER — FENTANYL CITRATE (PF) 100 MCG/2ML IJ SOLN
INTRAMUSCULAR | Status: AC
  Filled 2022-01-14: qty 4

## 2022-01-14 MED ORDER — MIDAZOLAM HCL 2 MG/2ML IJ SOLN
INTRAMUSCULAR | Status: AC | PRN
  Administered 2022-01-14: .5 mg via INTRAVENOUS
  Administered 2022-01-14: 1 mg via INTRAVENOUS
  Administered 2022-01-14: .5 mg via INTRAVENOUS

## 2022-01-14 MED ORDER — HEPARIN SOD (PORK) LOCK FLUSH 100 UNIT/ML IV SOLN
INTRAVENOUS | Status: AC
  Administered 2022-01-14: 500 [IU]
  Filled 2022-01-14: qty 5

## 2022-01-14 MED ORDER — SODIUM CHLORIDE 0.9 % IV SOLN: INTRAVENOUS | Status: DC

## 2022-01-14 NOTE — Telephone Encounter (Signed)
Nutrition Assessment   Reason for Assessment: MD referral   ASSESSMENT: 71 year old male with NSCLC metastatic to bone. S/p palliative radiation 10/4. He is pending start of chemoimunotherapy with carboplatin/paclitaxel + keytruda q21d (first planned 10/17). Patient is under the care of Dr. Delton Coombes.  Past medical history includes COPD, CLL (2010 -observation), GERD, depression, anxiety, HLD  Spoke with patient and wife via telephone. Patient had port placed earlier today. He reports 5/10 pain this afternoon. Patient endorses poor appetite. Says he just doesn't want anything. Wife reports pt will usually eat breakfast meal (oatmeal, egg, bacon, omelette, or cereal). Drinking one CIB mixed with 2% milk in the afternoon. Pt likes the taste of this better than Boost. Dinner meals have been a challenge. Wife reports sometimes pt will eat a small bowl of ice cream. Patient says bowels are moving "slow" - last BM was Saturday. Patient is taking senokot-s 2x/day. He is drinking a little bit of water, sprite, coffee. Wife reports trying to push fluids. Patient denies nausea, vomiting.   Nutrition Focused Physical Exam: Unable to complete (telephone visit)   Medications: lopressor, oxycontin, compazine, senokot, ambien   Labs: 10/9 - K 5.3, glucose 122, BUN 25   Anthropometrics:   Height: 6'1" Weight: 151 lb 8 oz (10/9) UBW: 175 lb (7/25) BMI: 19.79   NUTRITION DIAGNOSIS: Unintentional weight loss related to cancer as evidenced by ~16% (24 lbs) decrease from usual weight in 11 weeks - this is severe for time frame   INTERVENTION:  Discussed ways to add calories/protein to foods, making the most of every bite (switching to whole milk, swapping milk for water in soups/oatmeal, adding cheese/butter/gravy to foods, using supplement in coffee and in ice cream for high calorie shake) Encouraged smaller more frequent meals/snacks as well as bedtime snack - will provide ideas + shake  recipes Suggesting increasing CIB/Ensure TID  Will provide case of Ensure Plus HP  Ensure + handouts left at registration for pick up on 10/17  Continue bowel regimen per MD Consider trial of appetite stimulant    MONITORING, EVALUATION, GOAL: Pt will tolerate increased calories and protein to minimize further weight loss/promote wt gain   Next Visit: Monday November 6 via telephone

## 2022-01-14 NOTE — Procedures (Signed)
Interventional Radiology Procedure Note  Procedure: Placement of a right IJ approach single lumen PowerPort.  Tip is positioned at the superior cavoatrial junction and catheter is ready for immediate use.  Complications: No immediate Recommendations:  - Ok to shower tomorrow - Do not submerge for 7 days - Routine line care   Signed,  Liddie Chichester K. Malessa Zartman, MD   

## 2022-01-15 ENCOUNTER — Other Ambulatory Visit: Payer: Self-pay

## 2022-01-15 ENCOUNTER — Inpatient Hospital Stay: Payer: Medicare Other

## 2022-01-15 DIAGNOSIS — C3490 Malignant neoplasm of unspecified part of unspecified bronchus or lung: Secondary | ICD-10-CM

## 2022-01-15 DIAGNOSIS — C3492 Malignant neoplasm of unspecified part of left bronchus or lung: Secondary | ICD-10-CM

## 2022-01-15 MED ORDER — LIDOCAINE-PRILOCAINE 2.5-2.5 % EX CREA: TOPICAL_CREAM | CUTANEOUS | 3 refills | Status: AC

## 2022-01-15 MED ORDER — PROCHLORPERAZINE MALEATE 10 MG PO TABS: 10.0000 mg | ORAL_TABLET | Freq: Four times a day (QID) | ORAL | 3 refills | Status: AC | PRN

## 2022-01-15 NOTE — Progress Notes (Signed)
Chemotherapy/immunotherapy education packet given and discussed with pt and family in detail.  Discussed diagnosis and staging, tx regimen, and intent of tx.  Reviewed chemotherapy/immunotherapy medications and side effects, as well as pre-medications.  Instructed on how to manage side effects at home, and when to call the clinic.  Importance of fever/chills discussed with pt and family. Discussed precautions to implement at home after receiving tx, as well as self care strategies. Phone numbers provided for clinic during regular working hours, also how to reach the clinic after hours and on weekends. Pt and family provided the opportunity to ask questions - all questions answered to pt's and family satisfaction.

## 2022-01-16 ENCOUNTER — Inpatient Hospital Stay: Payer: Medicare Other

## 2022-01-16 VITALS — BP 105/66 | HR 87 | Temp 97.9°F | Resp 17

## 2022-01-16 DIAGNOSIS — C7951 Secondary malignant neoplasm of bone: Secondary | ICD-10-CM | POA: Diagnosis present

## 2022-01-16 DIAGNOSIS — C3492 Malignant neoplasm of unspecified part of left bronchus or lung: Secondary | ICD-10-CM

## 2022-01-16 DIAGNOSIS — Z7982 Long term (current) use of aspirin: Secondary | ICD-10-CM

## 2022-01-16 DIAGNOSIS — C3412 Malignant neoplasm of upper lobe, left bronchus or lung: Secondary | ICD-10-CM | POA: Diagnosis present

## 2022-01-16 DIAGNOSIS — J449 Chronic obstructive pulmonary disease, unspecified: Secondary | ICD-10-CM | POA: Diagnosis present

## 2022-01-16 DIAGNOSIS — F411 Generalized anxiety disorder: Secondary | ICD-10-CM | POA: Diagnosis present

## 2022-01-16 DIAGNOSIS — F17210 Nicotine dependence, cigarettes, uncomplicated: Secondary | ICD-10-CM | POA: Diagnosis not present

## 2022-01-16 DIAGNOSIS — G8929 Other chronic pain: Secondary | ICD-10-CM | POA: Diagnosis present

## 2022-01-16 DIAGNOSIS — D649 Anemia, unspecified: Secondary | ICD-10-CM | POA: Diagnosis present

## 2022-01-16 DIAGNOSIS — Z5111 Encounter for antineoplastic chemotherapy: Secondary | ICD-10-CM | POA: Diagnosis not present

## 2022-01-16 DIAGNOSIS — Z79899 Other long term (current) drug therapy: Secondary | ICD-10-CM

## 2022-01-16 DIAGNOSIS — Z8249 Family history of ischemic heart disease and other diseases of the circulatory system: Secondary | ICD-10-CM

## 2022-01-16 DIAGNOSIS — R432 Parageusia: Secondary | ICD-10-CM | POA: Diagnosis present

## 2022-01-16 DIAGNOSIS — Z20822 Contact with and (suspected) exposure to covid-19: Secondary | ICD-10-CM | POA: Diagnosis present

## 2022-01-16 DIAGNOSIS — I2489 Other forms of acute ischemic heart disease: Secondary | ICD-10-CM | POA: Diagnosis present

## 2022-01-16 DIAGNOSIS — E8809 Other disorders of plasma-protein metabolism, not elsewhere classified: Secondary | ICD-10-CM | POA: Diagnosis present

## 2022-01-16 DIAGNOSIS — C3490 Malignant neoplasm of unspecified part of unspecified bronchus or lung: Secondary | ICD-10-CM

## 2022-01-16 DIAGNOSIS — Z87891 Personal history of nicotine dependence: Secondary | ICD-10-CM

## 2022-01-16 DIAGNOSIS — T451X5A Adverse effect of antineoplastic and immunosuppressive drugs, initial encounter: Secondary | ICD-10-CM | POA: Diagnosis present

## 2022-01-16 DIAGNOSIS — C9110 Chronic lymphocytic leukemia of B-cell type not having achieved remission: Secondary | ICD-10-CM | POA: Diagnosis present

## 2022-01-16 DIAGNOSIS — R627 Adult failure to thrive: Secondary | ICD-10-CM | POA: Diagnosis present

## 2022-01-16 DIAGNOSIS — E860 Dehydration: Principal | ICD-10-CM | POA: Diagnosis present

## 2022-01-16 DIAGNOSIS — Z823 Family history of stroke: Secondary | ICD-10-CM

## 2022-01-16 DIAGNOSIS — Z5112 Encounter for antineoplastic immunotherapy: Secondary | ICD-10-CM | POA: Diagnosis present

## 2022-01-16 DIAGNOSIS — Z800 Family history of malignant neoplasm of digestive organs: Secondary | ICD-10-CM

## 2022-01-16 DIAGNOSIS — I959 Hypotension, unspecified: Secondary | ICD-10-CM | POA: Diagnosis present

## 2022-01-16 DIAGNOSIS — G4700 Insomnia, unspecified: Secondary | ICD-10-CM | POA: Diagnosis present

## 2022-01-16 DIAGNOSIS — E782 Mixed hyperlipidemia: Secondary | ICD-10-CM | POA: Diagnosis present

## 2022-01-16 DIAGNOSIS — R197 Diarrhea, unspecified: Secondary | ICD-10-CM | POA: Diagnosis not present

## 2022-01-16 DIAGNOSIS — Z23 Encounter for immunization: Secondary | ICD-10-CM

## 2022-01-16 DIAGNOSIS — Z6820 Body mass index (BMI) 20.0-20.9, adult: Secondary | ICD-10-CM

## 2022-01-16 DIAGNOSIS — E43 Unspecified severe protein-calorie malnutrition: Secondary | ICD-10-CM | POA: Diagnosis present

## 2022-01-16 DIAGNOSIS — K219 Gastro-esophageal reflux disease without esophagitis: Secondary | ICD-10-CM | POA: Diagnosis present

## 2022-01-16 DIAGNOSIS — R110 Nausea: Secondary | ICD-10-CM | POA: Diagnosis not present

## 2022-01-16 DIAGNOSIS — Z9221 Personal history of antineoplastic chemotherapy: Secondary | ICD-10-CM

## 2022-01-16 DIAGNOSIS — I471 Supraventricular tachycardia, unspecified: Secondary | ICD-10-CM | POA: Diagnosis present

## 2022-01-16 DIAGNOSIS — F32A Depression, unspecified: Secondary | ICD-10-CM | POA: Diagnosis present

## 2022-01-16 DIAGNOSIS — E876 Hypokalemia: Secondary | ICD-10-CM | POA: Diagnosis present

## 2022-01-16 DIAGNOSIS — Z801 Family history of malignant neoplasm of trachea, bronchus and lung: Secondary | ICD-10-CM

## 2022-01-16 DIAGNOSIS — Z8616 Personal history of COVID-19: Secondary | ICD-10-CM

## 2022-01-16 LAB — CBC WITH DIFFERENTIAL/PLATELET
Abs Immature Granulocytes: 0.14 10*3/uL — ABNORMAL HIGH (ref 0.00–0.07)
Basophils Absolute: 0 10*3/uL (ref 0.0–0.1)
Basophils Relative: 0 %
Eosinophils Absolute: 0.1 10*3/uL (ref 0.0–0.5)
Eosinophils Relative: 0 %
HCT: 32.2 % — ABNORMAL LOW (ref 39.0–52.0)
Hemoglobin: 10.3 g/dL — ABNORMAL LOW (ref 13.0–17.0)
Immature Granulocytes: 0 %
Lymphocytes Relative: 88 %
Lymphs Abs: 47.7 10*3/uL — ABNORMAL HIGH (ref 0.7–4.0)
MCH: 28.9 pg (ref 26.0–34.0)
MCHC: 32 g/dL (ref 30.0–36.0)
MCV: 90.2 fL (ref 80.0–100.0)
Monocytes Absolute: 0.9 10*3/uL (ref 0.1–1.0)
Monocytes Relative: 2 %
Neutro Abs: 5.3 10*3/uL (ref 1.7–7.7)
Neutrophils Relative %: 10 %
Platelets: 279 10*3/uL (ref 150–400)
RBC: 3.57 MIL/uL — ABNORMAL LOW (ref 4.22–5.81)
RDW: 16.6 % — ABNORMAL HIGH (ref 11.5–15.5)
WBC: 54.1 10*3/uL (ref 4.0–10.5)
nRBC: 0 % (ref 0.0–0.2)

## 2022-01-16 LAB — COMPREHENSIVE METABOLIC PANEL
ALT: 29 U/L (ref 0–44)
AST: 20 U/L (ref 15–41)
Albumin: 2.7 g/dL — ABNORMAL LOW (ref 3.5–5.0)
Alkaline Phosphatase: 98 U/L (ref 38–126)
Anion gap: 8 (ref 5–15)
BUN: 18 mg/dL (ref 8–23)
CO2: 25 mmol/L (ref 22–32)
Calcium: 8.6 mg/dL — ABNORMAL LOW (ref 8.9–10.3)
Chloride: 103 mmol/L (ref 98–111)
Creatinine, Ser: 0.9 mg/dL (ref 0.61–1.24)
GFR, Estimated: >60 mL/min (ref >60)
Glucose, Bld: 127 mg/dL — ABNORMAL HIGH (ref 70–99)
Potassium: 4.1 mmol/L (ref 3.5–5.1)
Sodium: 136 mmol/L (ref 135–145)
Total Bilirubin: 0.5 mg/dL (ref 0.3–1.2)
Total Protein: 5.9 g/dL — ABNORMAL LOW (ref 6.5–8.1)

## 2022-01-16 LAB — TSH: TSH: 2.745 u[IU]/mL (ref 0.350–4.500)

## 2022-01-16 LAB — MAGNESIUM: Magnesium: 1.9 mg/dL (ref 1.7–2.4)

## 2022-01-16 MED ORDER — FAMOTIDINE IN NACL 20-0.9 MG/50ML-% IV SOLN
20.0000 mg | Freq: Once | INTRAVENOUS | Status: AC
  Administered 2022-01-16: 20 mg via INTRAVENOUS
  Filled 2022-01-16: qty 50

## 2022-01-16 MED ORDER — SODIUM CHLORIDE 0.9 % IV SOLN
200.0000 mg | Freq: Once | INTRAVENOUS | Status: AC
  Administered 2022-01-16: 200 mg via INTRAVENOUS
  Filled 2022-01-16: qty 8

## 2022-01-16 MED ORDER — SODIUM CHLORIDE 0.9 % IV SOLN
175.0000 mg/m2 | Freq: Once | INTRAVENOUS | Status: AC
  Administered 2022-01-16: 330 mg via INTRAVENOUS
  Filled 2022-01-16: qty 55

## 2022-01-16 MED ORDER — SODIUM CHLORIDE 0.9% FLUSH
10.0000 mL | INTRAVENOUS | Status: DC | PRN
  Administered 2022-01-16: 10 mL

## 2022-01-16 MED ORDER — SODIUM CHLORIDE 0.9 % IV SOLN
150.0000 mg | Freq: Once | INTRAVENOUS | Status: AC
  Administered 2022-01-16: 150 mg via INTRAVENOUS
  Filled 2022-01-16: qty 150

## 2022-01-16 MED ORDER — SODIUM CHLORIDE 0.9 % IV SOLN
10.0000 mg | Freq: Once | INTRAVENOUS | Status: AC
  Administered 2022-01-16: 10 mg via INTRAVENOUS
  Filled 2022-01-16: qty 1

## 2022-01-16 MED ORDER — SODIUM CHLORIDE 0.9 % IV SOLN
459.0000 mg | Freq: Once | INTRAVENOUS | Status: AC
  Administered 2022-01-16: 460 mg via INTRAVENOUS
  Filled 2022-01-16: qty 46

## 2022-01-16 MED ORDER — SODIUM CHLORIDE 0.9 % IV SOLN: Freq: Once | INTRAVENOUS | Status: AC

## 2022-01-16 MED ORDER — PALONOSETRON HCL INJECTION 0.25 MG/5ML
0.2500 mg | Freq: Once | INTRAVENOUS | Status: AC
  Administered 2022-01-16: 0.25 mg via INTRAVENOUS
  Filled 2022-01-16: qty 5

## 2022-01-16 MED ORDER — DIPHENHYDRAMINE HCL 50 MG/ML IJ SOLN
50.0000 mg | Freq: Once | INTRAMUSCULAR | Status: AC
  Administered 2022-01-16: 50 mg via INTRAVENOUS
  Filled 2022-01-16: qty 1

## 2022-01-16 MED ORDER — HEPARIN SOD (PORK) LOCK FLUSH 100 UNIT/ML IV SOLN
500.0000 [IU] | Freq: Once | INTRAVENOUS | Status: AC | PRN
  Administered 2022-01-16: 500 [IU]

## 2022-01-16 NOTE — Progress Notes (Signed)
Patient presents today for chemotherapy infusion.  Patient is in satisfactory condition with no new complaints voiced.  Vital signs are stable.  Labs reviewed.  All labs are within treatment parameters.  We will proceed with treatment per MD orders.   Patient tolerated treatment well with no complaints voiced.  Patient left ambulatory in stable condition.  Vital signs stable at discharge.  Follow up as scheduled.

## 2022-01-16 NOTE — Progress Notes (Signed)
Pharmacist Chemotherapy Monitoring - Initial Assessment    Anticipated start date: 01/16/22   The following has been reviewed per standard work regarding the patient's treatment regimen: The patient's diagnosis, treatment plan and drug doses, and organ/hematologic function Lab orders and baseline tests specific to treatment regimen  The treatment plan start date, drug sequencing, and pre-medications Prior authorization status  Patient's documented medication list, including drug-drug interaction screen and prescriptions for anti-emetics and supportive care specific to the treatment regimen The drug concentrations, fluid compatibility, administration routes, and timing of the medications to be used The patient's access for treatment and lifetime cumulative dose history, if applicable  The patient's medication allergies and previous infusion related reactions, if applicable   Changes made to treatment plan:  N/A  Follow up needed:  N/A   Wynona Neat, [facility], 01/16/2022  9:46 AM

## 2022-01-16 NOTE — Progress Notes (Signed)
CRITICAL VALUE STICKER CRITICAL VALUE:WBC 54.1 Dr. Delton Coombes aware.  No orders received at this time.

## 2022-01-16 NOTE — Progress Notes (Signed)
Patients port flushed without difficulty.  Good blood return noted with no bruising or swelling noted at site.  Patient remains accessed for chemotherapy treatment.  

## 2022-01-16 NOTE — Patient Instructions (Signed)
[facility]  Discharge Instructions: Thank you for choosing [facility] to provide your oncology and hematology care.  If you have a lab appointment with the [facility], please come in thru the Main Entrance and check in at the main information desk.  Wear comfortable clothing and clothing appropriate for easy access to any Portacath or PICC line.   We strive to give you quality time with your provider. You may need to reschedule your appointment if you arrive late (15 or more minutes).  Arriving late affects you and other patients whose appointments are after yours.  Also, if you miss three or more appointments without notifying the office, you may be dismissed from the clinic at the provider's discretion.      For prescription refill requests, have your pharmacy contact our office and allow 72 hours for refills to be completed.    Today you received the following chemotherapy and/or immunotherapy agents Taxol/Carboplatin/Keytruda.  Pembrolizumab Injection What is this medication? PEMBROLIZUMAB (PEM broe LIZ ue mab) treats some types of cancer. It works by helping your immune system slow or stop the spread of cancer cells. It is a monoclonal antibody. This medicine may be used for other purposes; ask your health care provider or pharmacist if you have questions. COMMON BRAND NAME(S): Keytruda What should I tell my care team before I take this medication? They need to know if you have any of these conditions: Allogeneic stem cell transplant (uses someone else's stem cells) Autoimmune diseases, such as Crohn disease, ulcerative colitis, lupus History of chest radiation Nervous system problems, such as Guillain-Barre syndrome, myasthenia gravis Organ transplant An unusual or allergic reaction to pembrolizumab, other medications, foods, dyes, or preservatives Pregnant or trying to get pregnant Breast-feeding How should I use this medication? This  medication is injected into a vein. It is given by your care team in a hospital or clinic setting. A special MedGuide will be given to you before each treatment. Be sure to read this information carefully each time. Talk to your care team about the use of this medication in children. While it may be prescribed for children as young as 6 months for selected conditions, precautions do apply. Overdosage: If you think you have taken too much of this medicine contact a poison control center or emergency room at once. NOTE: This medicine is only for you. Do not share this medicine with others. What if I miss a dose? Keep appointments for follow-up doses. It is important not to miss your dose. Call your care team if you are unable to keep an appointment. What may interact with this medication? Interactions have not been studied. This list may not describe all possible interactions. Give your health care provider a list of all the medicines, herbs, non-prescription drugs, or dietary supplements you use. Also tell them if you smoke, drink alcohol, or use illegal drugs. Some items may interact with your medicine. What should I watch for while using this medication? Your condition will be monitored carefully while you are receiving this medication. You may need blood work while taking this medication. This medication may cause serious skin reactions. They can happen weeks to months after starting the medication. Contact your care team right away if you notice fevers or flu-like symptoms with a rash. The rash may be red or purple and then turn into blisters or peeling of the skin. You may also notice a red rash with swelling of the face, lips, or lymph  nodes in your neck or under your arms. Tell your care team right away if you have any change in your eyesight. Talk to your care team if you may be pregnant. Serious birth defects can occur if you take this medication during pregnancy and for 4 months after the last  dose. You will need a negative pregnancy test before starting this medication. Contraception is recommended while taking this medication and for 4 months after the last dose. Your care team can help you find the option that works for you. Do not breastfeed while taking this medication and for 4 months after the last dose. What side effects may I notice from receiving this medication? Side effects that you should report to your care team as soon as possible: Allergic reactions--skin rash, itching, hives, swelling of the face, lips, tongue, or throat Dry cough, shortness of breath or trouble breathing Eye pain, redness, irritation, or discharge with blurry or decreased vision Heart muscle inflammation--unusual weakness or fatigue, shortness of breath, chest pain, fast or irregular heartbeat, dizziness, swelling of the ankles, feet, or hands Hormone gland problems--headache, sensitivity to light, unusual weakness or fatigue, dizziness, fast or irregular heartbeat, increased sensitivity to cold or heat, excessive sweating, constipation, hair loss, increased thirst or amount of urine, tremors or shaking, irritability Infusion reactions--chest pain, shortness of breath or trouble breathing, feeling faint or lightheaded Kidney injury (glomerulonephritis)--decrease in the amount of urine, red or dark [facility] urine, foamy or bubbly urine, swelling of the ankles, hands, or feet Liver injury--right upper belly pain, loss of appetite, nausea, light-colored stool, dark yellow or [facility] urine, yellowing skin or eyes, unusual weakness or fatigue Pain, tingling, or numbness in the hands or feet, muscle weakness, change in vision, confusion or trouble speaking, loss of balance or coordination, trouble walking, seizures Rash, fever, and swollen lymph nodes Redness, blistering, peeling, or loosening of the skin, including inside the mouth Sudden or severe stomach pain, bloody diarrhea, fever, nausea, vomiting Side effects  that usually do not require medical attention (report to your care team if they continue or are bothersome): Bone, joint, or muscle pain Diarrhea Fatigue Loss of appetite Nausea Skin rash This list may not describe all possible side effects. Call your doctor for medical advice about side effects. You may report side effects to FDA at 1-800-FDA-1088. Where should I keep my medication? This medication is given in a hospital or clinic. It will not be stored at home. NOTE: This sheet is a summary. It may not cover all possible information. If you have questions about this medicine, talk to your doctor, pharmacist, or health care provider.  2023 Elsevier/Gold Standard (2021-07-09 00:00:00)   Carboplatin Injection What is this medication? CARBOPLATIN (KAR boe pla tin) treats some types of cancer. It works by slowing down the growth of cancer cells. This medicine may be used for other purposes; ask your health care provider or pharmacist if you have questions. COMMON BRAND NAME(S): Paraplatin What should I tell my care team before I take this medication? They need to know if you have any of these conditions: Blood disorders Hearing problems Kidney disease Recent or ongoing radiation therapy An unusual or allergic reaction to carboplatin, cisplatin, other medications, foods, dyes, or preservatives Pregnant or trying to get pregnant Breast-feeding How should I use this medication? This medication is injected into a vein. It is given by your care team in a hospital or clinic setting. Talk to your care team about the use of this medication in children.  Special care may be needed. Overdosage: If you think you have taken too much of this medicine contact a poison control center or emergency room at once. NOTE: This medicine is only for you. Do not share this medicine with others. What if I miss a dose? Keep appointments for follow-up doses. It is important not to miss your dose. Call your care  team if you are unable to keep an appointment. What may interact with this medication? Medications for seizures Some antibiotics, such as amikacin, gentamicin, neomycin, streptomycin, tobramycin Vaccines This list may not describe all possible interactions. Give your health care provider a list of all the medicines, herbs, non-prescription drugs, or dietary supplements you use. Also tell them if you smoke, drink alcohol, or use illegal drugs. Some items may interact with your medicine. What should I watch for while using this medication? Your condition will be monitored carefully while you are receiving this medication. You may need blood work while taking this medication. This medication may make you feel generally unwell. This is not uncommon, as chemotherapy can affect healthy cells as well as cancer cells. Report any side effects. Continue your course of treatment even though you feel ill unless your care team tells you to stop. In some cases, you may be given additional medications to help with side effects. Follow all directions for their use. This medication may increase your risk of getting an infection. Call your care team for advice if you get a fever, chills, sore throat, or other symptoms of a cold or flu. Do not treat yourself. Try to avoid being around people who are sick. Avoid taking medications that contain aspirin, acetaminophen, ibuprofen, naproxen, or ketoprofen unless instructed by your care team. These medications may hide a fever. Be careful brushing or flossing your teeth or using a toothpick because you may get an infection or bleed more easily. If you have any dental work done, tell your dentist you are receiving this medication. Talk to your care team if you wish to become pregnant or think you might be pregnant. This medication can cause serious birth defects. Talk to your care team about effective forms of contraception. Do not breast-feed while taking this  medication. What side effects may I notice from receiving this medication? Side effects that you should report to your care team as soon as possible: Allergic reactions--skin rash, itching, hives, swelling of the face, lips, tongue, or throat Infection--fever, chills, cough, sore throat, wounds that don't heal, pain or trouble when passing urine, general feeling of discomfort or being unwell Low red blood cell level--unusual weakness or fatigue, dizziness, headache, trouble breathing Pain, tingling, or numbness in the hands or feet, muscle weakness, change in vision, confusion or trouble speaking, loss of balance or coordination, trouble walking, seizures Unusual bruising or bleeding Side effects that usually do not require medical attention (report to your care team if they continue or are bothersome): Hair loss Nausea Unusual weakness or fatigue Vomiting This list may not describe all possible side effects. Call your doctor for medical advice about side effects. You may report side effects to FDA at 1-800-FDA-1088. Where should I keep my medication? This medication is given in a hospital or clinic. It will not be stored at home. NOTE: This sheet is a summary. It may not cover all possible information. If you have questions about this medicine, talk to your doctor, pharmacist, or health care provider.  2023 Elsevier/Gold Standard (2021-07-10 00:00:00)   Paclitaxel Injection What is this medication?  PACLITAXEL (PAK li TAX el) treats some types of cancer. It works by slowing down the growth of cancer cells. This medicine may be used for other purposes; ask your health care provider or pharmacist if you have questions. COMMON BRAND NAME(S): Onxol, Taxol What should I tell my care team before I take this medication? They need to know if you have any of these conditions: Heart disease Liver disease Low white blood cell levels An unusual or allergic reaction to paclitaxel, other  medications, foods, dyes, or preservatives If you or your partner are pregnant or trying to get pregnant Breast-feeding How should I use this medication? This medication is injected into a vein. It is given by your care team in a hospital or clinic setting. Talk to your care team about the use of this medication in children. While it may be given to children for selected conditions, precautions do apply. Overdosage: If you think you have taken too much of this medicine contact a poison control center or emergency room at once. NOTE: This medicine is only for you. Do not share this medicine with others. What if I miss a dose? Keep appointments for follow-up doses. It is important not to miss your dose. Call your care team if you are unable to keep an appointment. What may interact with this medication? Do not take this medication with any of the following: Live virus vaccines Other medications may affect the way this medication works. Talk with your care team about all of the medications you take. They may suggest changes to your treatment plan to lower the risk of side effects and to make sure your medications work as intended. This list may not describe all possible interactions. Give your health care provider a list of all the medicines, herbs, non-prescription drugs, or dietary supplements you use. Also tell them if you smoke, drink alcohol, or use illegal drugs. Some items may interact with your medicine. What should I watch for while using this medication? Your condition will be monitored carefully while you are receiving this medication. You may need blood work while taking this medication. This medication may make you feel generally unwell. This is not uncommon as chemotherapy can affect healthy cells as well as cancer cells. Report any side effects. Continue your course of treatment even though you feel ill unless your care team tells you to stop. This medication can cause serious allergic  reactions. To reduce the risk, your care team may give you other medications to take before receiving this one. Be sure to follow the directions from your care team. This medication may increase your risk of getting an infection. Call your care team for advice if you get a fever, chills, sore throat, or other symptoms of a cold or flu. Do not treat yourself. Try to avoid being around people who are sick. This medication may increase your risk to bruise or bleed. Call your care team if you notice any unusual bleeding. Be careful brushing or flossing your teeth or using a toothpick because you may get an infection or bleed more easily. If you have any dental work done, tell your dentist you are receiving this medication. Talk to your care team if you may be pregnant. Serious birth defects can occur if you take this medication during pregnancy. Talk to your care team before breastfeeding. Changes to your treatment plan may be needed. What side effects may I notice from receiving this medication? Side effects that you should report to your care  team as soon as possible: Allergic reactions--skin rash, itching, hives, swelling of the face, lips, tongue, or throat Heart rhythm changes--fast or irregular heartbeat, dizziness, feeling faint or lightheaded, chest pain, trouble breathing Increase in blood pressure Infection--fever, chills, cough, sore throat, wounds that don't heal, pain or trouble when passing urine, general feeling of discomfort or being unwell Low blood pressure--dizziness, feeling faint or lightheaded, blurry vision Low red blood cell level--unusual weakness or fatigue, dizziness, headache, trouble breathing Painful swelling, warmth, or redness of the skin, blisters or sores at the infusion site Pain, tingling, or numbness in the hands or feet Slow heartbeat--dizziness, feeling faint or lightheaded, confusion, trouble breathing, unusual weakness or fatigue Unusual bruising or  bleeding Side effects that usually do not require medical attention (report to your care team if they continue or are bothersome): Diarrhea Hair loss Joint pain Loss of appetite Muscle pain Nausea Vomiting This list may not describe all possible side effects. Call your doctor for medical advice about side effects. You may report side effects to FDA at 1-800-FDA-1088. Where should I keep my medication? This medication is given in a hospital or clinic. It will not be stored at home. NOTE: This sheet is a summary. It may not cover all possible information. If you have questions about this medicine, talk to your doctor, pharmacist, or health care provider.  2023 Elsevier/Gold Standard (2021-08-02 00:00:00)        To help prevent nausea and vomiting after your treatment, we encourage you to take your nausea medication as directed.  BELOW ARE SYMPTOMS THAT SHOULD BE REPORTED IMMEDIATELY: *FEVER GREATER THAN 100.4 F (38 C) OR HIGHER *CHILLS OR SWEATING *NAUSEA AND VOMITING THAT IS NOT CONTROLLED WITH YOUR NAUSEA MEDICATION *UNUSUAL SHORTNESS OF BREATH *UNUSUAL BRUISING OR BLEEDING *URINARY PROBLEMS (pain or burning when urinating, or frequent urination) *BOWEL PROBLEMS (unusual diarrhea, constipation, pain near the anus) TENDERNESS IN MOUTH AND THROAT WITH OR WITHOUT PRESENCE OF ULCERS (sore throat, sores in mouth, or a toothache) UNUSUAL RASH, SWELLING OR PAIN  UNUSUAL VAGINAL DISCHARGE OR ITCHING   Items with * indicate a potential emergency and should be followed up as soon as possible or go to the Emergency Department if any problems should occur.  Please show the CHEMOTHERAPY ALERT CARD or IMMUNOTHERAPY ALERT CARD at check-in to the Emergency Department and triage nurse.  Should you have questions after your visit or need to cancel or reschedule your appointment, please contact [facility] [phone]  and follow the prompts.  Office hours are 8:00 a.m. to  4:30 p.m. Monday - Friday. Please note that voicemails left after 4:00 p.m. may not be returned until the following business day.  We are closed weekends and major holidays. You have access to a nurse at all times for urgent questions. Please call the main number to the clinic [phone] and follow the prompts.  For any non-urgent questions, you may also contact your provider using MyChart. We now offer e-Visits for anyone 70 and older to request care online for non-urgent symptoms. For details visit [email or web address].   Also download the MyChart app! Go to the app store, search "MyChart", open the app, select [facility], and log in with your MyChart username and password.  Masks are optional in the cancer centers. If you would like for your care team to wear a mask while they are taking care of you, please let them know. You may have one support person who is at least 70 years old  accompany you for your appointments.

## 2022-01-17 ENCOUNTER — Telehealth: Payer: Self-pay | Admitting: *Deleted

## 2022-01-17 LAB — T4: T4, Total: 7.4 ug/dL (ref 4.5–12.0)

## 2022-01-17 NOTE — Telephone Encounter (Signed)
24 hour chemotherapy follow-up call placed today. Pt stated pt felt good but his hands were a little shaky.  Pt denies nausea, vomiting, shortness of breath,and headaches. Pt advised to call the clinic if needed.

## 2022-01-18 ENCOUNTER — Inpatient Hospital Stay: Payer: Medicare Other

## 2022-01-18 VITALS — BP 101/59 | HR 107 | Temp 98.6°F | Resp 18

## 2022-01-18 DIAGNOSIS — R9431 Abnormal electrocardiogram [ECG] [EKG]: Secondary | ICD-10-CM | POA: Diagnosis not present

## 2022-01-18 DIAGNOSIS — G4700 Insomnia, unspecified: Secondary | ICD-10-CM | POA: Diagnosis present

## 2022-01-18 DIAGNOSIS — C3412 Malignant neoplasm of upper lobe, left bronchus or lung: Secondary | ICD-10-CM | POA: Diagnosis present

## 2022-01-18 DIAGNOSIS — R112 Nausea with vomiting, unspecified: Secondary | ICD-10-CM | POA: Diagnosis not present

## 2022-01-18 DIAGNOSIS — D649 Anemia, unspecified: Secondary | ICD-10-CM | POA: Diagnosis present

## 2022-01-18 DIAGNOSIS — I471 Supraventricular tachycardia, unspecified: Secondary | ICD-10-CM | POA: Diagnosis not present

## 2022-01-18 DIAGNOSIS — I2489 Other forms of acute ischemic heart disease: Secondary | ICD-10-CM | POA: Diagnosis present

## 2022-01-18 DIAGNOSIS — Z20822 Contact with and (suspected) exposure to covid-19: Secondary | ICD-10-CM | POA: Diagnosis present

## 2022-01-18 DIAGNOSIS — R432 Parageusia: Secondary | ICD-10-CM | POA: Diagnosis present

## 2022-01-18 DIAGNOSIS — Z8616 Personal history of COVID-19: Secondary | ICD-10-CM | POA: Diagnosis not present

## 2022-01-18 DIAGNOSIS — E782 Mixed hyperlipidemia: Secondary | ICD-10-CM | POA: Diagnosis present

## 2022-01-18 DIAGNOSIS — J449 Chronic obstructive pulmonary disease, unspecified: Secondary | ICD-10-CM | POA: Diagnosis not present

## 2022-01-18 DIAGNOSIS — F32A Depression, unspecified: Secondary | ICD-10-CM | POA: Diagnosis present

## 2022-01-18 DIAGNOSIS — R4182 Altered mental status, unspecified: Secondary | ICD-10-CM | POA: Diagnosis not present

## 2022-01-18 DIAGNOSIS — Z7189 Other specified counseling: Secondary | ICD-10-CM | POA: Diagnosis not present

## 2022-01-18 DIAGNOSIS — K219 Gastro-esophageal reflux disease without esophagitis: Secondary | ICD-10-CM | POA: Diagnosis present

## 2022-01-18 DIAGNOSIS — E876 Hypokalemia: Secondary | ICD-10-CM | POA: Diagnosis not present

## 2022-01-18 DIAGNOSIS — C3492 Malignant neoplasm of unspecified part of left bronchus or lung: Secondary | ICD-10-CM

## 2022-01-18 DIAGNOSIS — E43 Unspecified severe protein-calorie malnutrition: Secondary | ICD-10-CM | POA: Diagnosis not present

## 2022-01-18 DIAGNOSIS — E860 Dehydration: Secondary | ICD-10-CM | POA: Diagnosis not present

## 2022-01-18 DIAGNOSIS — R197 Diarrhea, unspecified: Secondary | ICD-10-CM

## 2022-01-18 DIAGNOSIS — Z8249 Family history of ischemic heart disease and other diseases of the circulatory system: Secondary | ICD-10-CM | POA: Diagnosis not present

## 2022-01-18 DIAGNOSIS — F411 Generalized anxiety disorder: Secondary | ICD-10-CM | POA: Diagnosis not present

## 2022-01-18 DIAGNOSIS — Z87891 Personal history of nicotine dependence: Secondary | ICD-10-CM | POA: Diagnosis not present

## 2022-01-18 DIAGNOSIS — R000 Tachycardia, unspecified: Secondary | ICD-10-CM | POA: Diagnosis not present

## 2022-01-18 DIAGNOSIS — E8809 Other disorders of plasma-protein metabolism, not elsewhere classified: Secondary | ICD-10-CM | POA: Diagnosis not present

## 2022-01-18 DIAGNOSIS — R079 Chest pain, unspecified: Secondary | ICD-10-CM | POA: Diagnosis not present

## 2022-01-18 DIAGNOSIS — R110 Nausea: Secondary | ICD-10-CM

## 2022-01-18 DIAGNOSIS — C9110 Chronic lymphocytic leukemia of B-cell type not having achieved remission: Secondary | ICD-10-CM | POA: Diagnosis not present

## 2022-01-18 DIAGNOSIS — C7951 Secondary malignant neoplasm of bone: Secondary | ICD-10-CM | POA: Diagnosis present

## 2022-01-18 DIAGNOSIS — Z23 Encounter for immunization: Secondary | ICD-10-CM | POA: Diagnosis not present

## 2022-01-18 DIAGNOSIS — R627 Adult failure to thrive: Secondary | ICD-10-CM | POA: Diagnosis not present

## 2022-01-18 DIAGNOSIS — C9111 Chronic lymphocytic leukemia of B-cell type in remission: Secondary | ICD-10-CM | POA: Diagnosis not present

## 2022-01-18 DIAGNOSIS — R634 Abnormal weight loss: Secondary | ICD-10-CM | POA: Diagnosis not present

## 2022-01-18 DIAGNOSIS — J90 Pleural effusion, not elsewhere classified: Secondary | ICD-10-CM | POA: Diagnosis not present

## 2022-01-18 DIAGNOSIS — Z5112 Encounter for antineoplastic immunotherapy: Secondary | ICD-10-CM | POA: Diagnosis not present

## 2022-01-18 DIAGNOSIS — R059 Cough, unspecified: Secondary | ICD-10-CM | POA: Diagnosis not present

## 2022-01-18 DIAGNOSIS — Z515 Encounter for palliative care: Secondary | ICD-10-CM | POA: Diagnosis not present

## 2022-01-18 MED ORDER — SODIUM CHLORIDE 0.9% FLUSH
10.0000 mL | Freq: Once | INTRAVENOUS | Status: AC
  Administered 2022-01-18: 10 mL via INTRAVENOUS

## 2022-01-18 MED ORDER — HEPARIN SOD (PORK) LOCK FLUSH 100 UNIT/ML IV SOLN
500.0000 [IU] | Freq: Once | INTRAVENOUS | Status: AC
  Administered 2022-01-18: 500 [IU] via INTRAVENOUS

## 2022-01-18 MED ORDER — SODIUM CHLORIDE FLUSH 0.9 % IV SOLN: 10.0000 mL | Freq: Once | INTRAVENOUS | Status: DC

## 2022-01-18 MED ORDER — PEGFILGRASTIM-CBQV 6 MG/0.6ML ~~LOC~~ SOSY
6.0000 mg | PREFILLED_SYRINGE | Freq: Once | SUBCUTANEOUS | Status: AC
  Administered 2022-01-18: 6 mg via SUBCUTANEOUS
  Filled 2022-01-18: qty 0.6

## 2022-01-18 MED ORDER — OXYCODONE HCL 5 MG PO TABS
10.0000 mg | ORAL_TABLET | Freq: Once | ORAL | Status: AC
  Administered 2022-01-18: 10 mg via ORAL
  Filled 2022-01-18: qty 2

## 2022-01-18 MED ORDER — SODIUM CHLORIDE 0.9 % IV SOLN: INTRAVENOUS | Status: DC

## 2022-01-18 NOTE — Patient Instructions (Signed)
[facility]  Discharge Instructions: Thank you for choosing [facility] to provide your oncology and hematology care.  If you have a lab appointment with the Cancer Center, please come in thru the Main Entrance and check in at the main information desk.  Wear comfortable clothing and clothing appropriate for easy access to any Portacath or PICC line.   We strive to give you quality time with your provider. You may need to reschedule your appointment if you arrive late (15 or more minutes).  Arriving late affects you and other patients whose appointments are after yours.  Also, if you miss three or more appointments without notifying the office, you may be dismissed from the clinic at the provider's discretion.      For prescription refill requests, have your pharmacy contact our office and allow 72 hours for refills to be completed.     To help prevent nausea and vomiting after your treatment, we encourage you to take your nausea medication as directed.  BELOW ARE SYMPTOMS THAT SHOULD BE REPORTED IMMEDIATELY: *FEVER GREATER THAN 100.4 F (38 C) OR HIGHER *CHILLS OR SWEATING *NAUSEA AND VOMITING THAT IS NOT CONTROLLED WITH YOUR NAUSEA MEDICATION *UNUSUAL SHORTNESS OF BREATH *UNUSUAL BRUISING OR BLEEDING *URINARY PROBLEMS (pain or burning when urinating, or frequent urination) *BOWEL PROBLEMS (unusual diarrhea, constipation, pain near the anus) TENDERNESS IN MOUTH AND THROAT WITH OR WITHOUT PRESENCE OF ULCERS (sore throat, sores in mouth, or a toothache) UNUSUAL RASH, SWELLING OR PAIN  UNUSUAL VAGINAL DISCHARGE OR ITCHING   Items with * indicate a potential emergency and should be followed up as soon as possible or go to the Emergency Department if any problems should occur.  Please show the CHEMOTHERAPY ALERT CARD or IMMUNOTHERAPY ALERT CARD at check-in to the Emergency Department and triage nurse.  Should you have questions after your visit or need to  cancel or reschedule your appointment, please contact [facility] [phone]  and follow the prompts.  Office hours are 8:00 a.m. to 4:30 p.m. Monday - Friday. Please note that voicemails left after 4:00 p.m. may not be returned until the following business day.  We are closed weekends and major holidays. You have access to a nurse at all times for urgent questions. Please call the main number to the clinic [phone] and follow the prompts.  For any non-urgent questions, you may also contact your provider using MyChart. We now offer e-Visits for anyone 18 and older to request care online for non-urgent symptoms. For details visit [email or web address].   Also download the MyChart app! Go to the app store, search "MyChart", open the app, select [facility], and log in with your MyChart username and password.  Masks are optional in the cancer centers. If you would like for your care team to wear a mask while they are taking care of you, please let them know. You may have one support person who is at least 70 years old accompany you for your appointments.  

## 2022-01-18 NOTE — Progress Notes (Signed)
Patient had [place], taxol, and [place] D1C1 yesterday and today Udenyca. He had 2 episodes of diarrhea last night and took Imodium and no diarrhea this morning. He is having nausea and using his nausea meds as directed. His HR 109 and BP 107/69.  Give patient Normal saline 1 liter over 2 hours verbal order Lyanne Co, PA.    Patient tolerated hydration with no complaints voiced.  Port site clean and dry with good blood return noted before and after hydration.  No bruising or swelling noted with port.  Band aid applied.  VSS with discharge and left ambulatory with no s/s of distress noted.

## 2022-01-19 ENCOUNTER — Emergency Department (HOSPITAL_COMMUNITY): Payer: Medicare Other

## 2022-01-19 ENCOUNTER — Inpatient Hospital Stay (HOSPITAL_COMMUNITY)
Admission: EM | Admit: 2022-01-19 | Discharge: 2022-01-24 | DRG: 640 | Disposition: A | Discharge status: Home Health | Payer: Medicare Other | Attending: Internal Medicine | Admitting: Internal Medicine

## 2022-01-19 ENCOUNTER — Encounter (HOSPITAL_COMMUNITY): Payer: Self-pay

## 2022-01-19 ENCOUNTER — Other Ambulatory Visit: Payer: Self-pay

## 2022-01-19 DIAGNOSIS — C9111 Chronic lymphocytic leukemia of B-cell type in remission: Secondary | ICD-10-CM | POA: Diagnosis not present

## 2022-01-19 DIAGNOSIS — R112 Nausea with vomiting, unspecified: Secondary | ICD-10-CM

## 2022-01-19 DIAGNOSIS — G4700 Insomnia, unspecified: Secondary | ICD-10-CM | POA: Diagnosis present

## 2022-01-19 DIAGNOSIS — Z800 Family history of malignant neoplasm of digestive organs: Secondary | ICD-10-CM

## 2022-01-19 DIAGNOSIS — Z6820 Body mass index (BMI) 20.0-20.9, adult: Secondary | ICD-10-CM

## 2022-01-19 DIAGNOSIS — R000 Tachycardia, unspecified: Secondary | ICD-10-CM | POA: Diagnosis not present

## 2022-01-19 DIAGNOSIS — Z87891 Personal history of nicotine dependence: Secondary | ICD-10-CM

## 2022-01-19 DIAGNOSIS — C3492 Malignant neoplasm of unspecified part of left bronchus or lung: Secondary | ICD-10-CM | POA: Diagnosis present

## 2022-01-19 DIAGNOSIS — E876 Hypokalemia: Secondary | ICD-10-CM | POA: Diagnosis present

## 2022-01-19 DIAGNOSIS — J449 Chronic obstructive pulmonary disease, unspecified: Secondary | ICD-10-CM | POA: Diagnosis present

## 2022-01-19 DIAGNOSIS — R634 Abnormal weight loss: Secondary | ICD-10-CM | POA: Diagnosis present

## 2022-01-19 DIAGNOSIS — T451X5A Adverse effect of antineoplastic and immunosuppressive drugs, initial encounter: Secondary | ICD-10-CM | POA: Diagnosis present

## 2022-01-19 DIAGNOSIS — E8809 Other disorders of plasma-protein metabolism, not elsewhere classified: Secondary | ICD-10-CM | POA: Diagnosis present

## 2022-01-19 DIAGNOSIS — R627 Adult failure to thrive: Secondary | ICD-10-CM | POA: Diagnosis present

## 2022-01-19 DIAGNOSIS — K219 Gastro-esophageal reflux disease without esophagitis: Secondary | ICD-10-CM | POA: Diagnosis present

## 2022-01-19 DIAGNOSIS — Z7982 Long term (current) use of aspirin: Secondary | ICD-10-CM

## 2022-01-19 DIAGNOSIS — R9431 Abnormal electrocardiogram [ECG] [EKG]: Secondary | ICD-10-CM | POA: Diagnosis present

## 2022-01-19 DIAGNOSIS — C9110 Chronic lymphocytic leukemia of B-cell type not having achieved remission: Secondary | ICD-10-CM

## 2022-01-19 DIAGNOSIS — E860 Dehydration: Secondary | ICD-10-CM | POA: Diagnosis not present

## 2022-01-19 DIAGNOSIS — Z79899 Other long term (current) drug therapy: Secondary | ICD-10-CM

## 2022-01-19 DIAGNOSIS — Z801 Family history of malignant neoplasm of trachea, bronchus and lung: Secondary | ICD-10-CM

## 2022-01-19 DIAGNOSIS — Z8616 Personal history of COVID-19: Secondary | ICD-10-CM

## 2022-01-19 DIAGNOSIS — L8990 Pressure ulcer of unspecified site, unspecified stage: Secondary | ICD-10-CM | POA: Insufficient documentation

## 2022-01-19 DIAGNOSIS — R079 Chest pain, unspecified: Secondary | ICD-10-CM | POA: Diagnosis not present

## 2022-01-19 DIAGNOSIS — Z9221 Personal history of antineoplastic chemotherapy: Secondary | ICD-10-CM

## 2022-01-19 DIAGNOSIS — F32A Depression, unspecified: Secondary | ICD-10-CM | POA: Diagnosis present

## 2022-01-19 DIAGNOSIS — E43 Unspecified severe protein-calorie malnutrition: Secondary | ICD-10-CM | POA: Diagnosis present

## 2022-01-19 DIAGNOSIS — I2489 Other forms of acute ischemic heart disease: Secondary | ICD-10-CM | POA: Diagnosis present

## 2022-01-19 DIAGNOSIS — Z20822 Contact with and (suspected) exposure to covid-19: Secondary | ICD-10-CM | POA: Diagnosis present

## 2022-01-19 DIAGNOSIS — Z823 Family history of stroke: Secondary | ICD-10-CM

## 2022-01-19 DIAGNOSIS — I959 Hypotension, unspecified: Secondary | ICD-10-CM | POA: Diagnosis present

## 2022-01-19 DIAGNOSIS — F411 Generalized anxiety disorder: Secondary | ICD-10-CM | POA: Diagnosis present

## 2022-01-19 DIAGNOSIS — Z8249 Family history of ischemic heart disease and other diseases of the circulatory system: Secondary | ICD-10-CM

## 2022-01-19 DIAGNOSIS — Z23 Encounter for immunization: Secondary | ICD-10-CM

## 2022-01-19 DIAGNOSIS — R432 Parageusia: Secondary | ICD-10-CM | POA: Diagnosis present

## 2022-01-19 DIAGNOSIS — D649 Anemia, unspecified: Secondary | ICD-10-CM | POA: Diagnosis present

## 2022-01-19 DIAGNOSIS — R4182 Altered mental status, unspecified: Secondary | ICD-10-CM | POA: Diagnosis not present

## 2022-01-19 DIAGNOSIS — G8929 Other chronic pain: Secondary | ICD-10-CM | POA: Diagnosis present

## 2022-01-19 DIAGNOSIS — I471 Supraventricular tachycardia, unspecified: Secondary | ICD-10-CM | POA: Diagnosis present

## 2022-01-19 DIAGNOSIS — E782 Mixed hyperlipidemia: Secondary | ICD-10-CM | POA: Diagnosis present

## 2022-01-19 DIAGNOSIS — C7951 Secondary malignant neoplasm of bone: Secondary | ICD-10-CM | POA: Diagnosis present

## 2022-01-19 DIAGNOSIS — C3412 Malignant neoplasm of upper lobe, left bronchus or lung: Secondary | ICD-10-CM | POA: Diagnosis present

## 2022-01-19 MED ORDER — SODIUM CHLORIDE 0.9 % IV BOLUS
1000.0000 mL | Freq: Once | INTRAVENOUS | Status: AC
  Administered 2022-01-19: 1000 mL via INTRAVENOUS

## 2022-01-19 NOTE — ED Triage Notes (Signed)
Pt reports nausea. Chemo on wed and has not eaten since Thursday. Pt received IVF yesterday. Pt wife states he has become disoriented lately. Pt wife caught pt trying to push his teeth with a razor.

## 2022-01-20 ENCOUNTER — Observation Stay (HOSPITAL_COMMUNITY): Payer: Medicare Other

## 2022-01-20 ENCOUNTER — Other Ambulatory Visit: Payer: Self-pay

## 2022-01-20 DIAGNOSIS — R000 Tachycardia, unspecified: Secondary | ICD-10-CM | POA: Diagnosis present

## 2022-01-20 DIAGNOSIS — R059 Cough, unspecified: Secondary | ICD-10-CM | POA: Diagnosis not present

## 2022-01-20 DIAGNOSIS — C7951 Secondary malignant neoplasm of bone: Secondary | ICD-10-CM | POA: Diagnosis present

## 2022-01-20 DIAGNOSIS — Z8249 Family history of ischemic heart disease and other diseases of the circulatory system: Secondary | ICD-10-CM | POA: Diagnosis not present

## 2022-01-20 DIAGNOSIS — E876 Hypokalemia: Secondary | ICD-10-CM | POA: Diagnosis present

## 2022-01-20 DIAGNOSIS — E8809 Other disorders of plasma-protein metabolism, not elsewhere classified: Secondary | ICD-10-CM | POA: Diagnosis present

## 2022-01-20 DIAGNOSIS — C3492 Malignant neoplasm of unspecified part of left bronchus or lung: Secondary | ICD-10-CM | POA: Diagnosis not present

## 2022-01-20 DIAGNOSIS — R112 Nausea with vomiting, unspecified: Secondary | ICD-10-CM

## 2022-01-20 DIAGNOSIS — F32A Depression, unspecified: Secondary | ICD-10-CM | POA: Diagnosis present

## 2022-01-20 DIAGNOSIS — G4700 Insomnia, unspecified: Secondary | ICD-10-CM | POA: Diagnosis present

## 2022-01-20 DIAGNOSIS — R627 Adult failure to thrive: Secondary | ICD-10-CM | POA: Diagnosis present

## 2022-01-20 DIAGNOSIS — F411 Generalized anxiety disorder: Secondary | ICD-10-CM | POA: Diagnosis present

## 2022-01-20 DIAGNOSIS — R9431 Abnormal electrocardiogram [ECG] [EKG]: Secondary | ICD-10-CM | POA: Diagnosis present

## 2022-01-20 DIAGNOSIS — Z8616 Personal history of COVID-19: Secondary | ICD-10-CM | POA: Diagnosis not present

## 2022-01-20 DIAGNOSIS — E782 Mixed hyperlipidemia: Secondary | ICD-10-CM | POA: Diagnosis present

## 2022-01-20 DIAGNOSIS — R634 Abnormal weight loss: Secondary | ICD-10-CM | POA: Diagnosis not present

## 2022-01-20 DIAGNOSIS — I471 Supraventricular tachycardia, unspecified: Secondary | ICD-10-CM | POA: Diagnosis present

## 2022-01-20 DIAGNOSIS — Z515 Encounter for palliative care: Secondary | ICD-10-CM | POA: Diagnosis not present

## 2022-01-20 DIAGNOSIS — Z20822 Contact with and (suspected) exposure to covid-19: Secondary | ICD-10-CM | POA: Diagnosis present

## 2022-01-20 DIAGNOSIS — C9110 Chronic lymphocytic leukemia of B-cell type not having achieved remission: Secondary | ICD-10-CM

## 2022-01-20 DIAGNOSIS — R110 Nausea: Secondary | ICD-10-CM | POA: Diagnosis not present

## 2022-01-20 DIAGNOSIS — E860 Dehydration: Secondary | ICD-10-CM | POA: Diagnosis present

## 2022-01-20 DIAGNOSIS — J90 Pleural effusion, not elsewhere classified: Secondary | ICD-10-CM | POA: Diagnosis not present

## 2022-01-20 DIAGNOSIS — K219 Gastro-esophageal reflux disease without esophagitis: Secondary | ICD-10-CM | POA: Diagnosis present

## 2022-01-20 DIAGNOSIS — D649 Anemia, unspecified: Secondary | ICD-10-CM | POA: Diagnosis present

## 2022-01-20 DIAGNOSIS — C3412 Malignant neoplasm of upper lobe, left bronchus or lung: Secondary | ICD-10-CM | POA: Diagnosis present

## 2022-01-20 DIAGNOSIS — J449 Chronic obstructive pulmonary disease, unspecified: Secondary | ICD-10-CM

## 2022-01-20 DIAGNOSIS — Z87891 Personal history of nicotine dependence: Secondary | ICD-10-CM | POA: Diagnosis not present

## 2022-01-20 DIAGNOSIS — E43 Unspecified severe protein-calorie malnutrition: Secondary | ICD-10-CM | POA: Diagnosis present

## 2022-01-20 DIAGNOSIS — R432 Parageusia: Secondary | ICD-10-CM | POA: Diagnosis present

## 2022-01-20 DIAGNOSIS — I2489 Other forms of acute ischemic heart disease: Secondary | ICD-10-CM | POA: Diagnosis present

## 2022-01-20 DIAGNOSIS — E46 Unspecified protein-calorie malnutrition: Secondary | ICD-10-CM

## 2022-01-20 DIAGNOSIS — Z23 Encounter for immunization: Secondary | ICD-10-CM | POA: Diagnosis present

## 2022-01-20 DIAGNOSIS — Z7189 Other specified counseling: Secondary | ICD-10-CM | POA: Diagnosis not present

## 2022-01-20 LAB — MAGNESIUM
Magnesium: 1.5 mg/dL — ABNORMAL LOW (ref 1.7–2.4)
Magnesium: 1.8 mg/dL (ref 1.7–2.4)

## 2022-01-20 LAB — CBC WITH DIFFERENTIAL/PLATELET
Abs Immature Granulocytes: 4.56 10*3/uL — ABNORMAL HIGH (ref 0.00–0.07)
Basophils Absolute: 0 10*3/uL (ref 0.0–0.1)
Basophils Relative: 0 %
Eosinophils Absolute: 0 10*3/uL (ref 0.0–0.5)
Eosinophils Relative: 0 %
HCT: 33.2 % — ABNORMAL LOW (ref 39.0–52.0)
Hemoglobin: 10.9 g/dL — ABNORMAL LOW (ref 13.0–17.0)
Immature Granulocytes: 7 %
Lymphocytes Relative: 68 %
Lymphs Abs: 42.3 10*3/uL — ABNORMAL HIGH (ref 0.7–4.0)
MCH: 28.9 pg (ref 26.0–34.0)
MCHC: 32.8 g/dL (ref 30.0–36.0)
MCV: 88.1 fL (ref 80.0–100.0)
Monocytes Absolute: 0.2 10*3/uL (ref 0.1–1.0)
Monocytes Relative: 0 %
Neutro Abs: 15.3 10*3/uL — ABNORMAL HIGH (ref 1.7–7.7)
Neutrophils Relative %: 25 %
Platelets: 279 10*3/uL (ref 150–400)
RBC: 3.77 MIL/uL — ABNORMAL LOW (ref 4.22–5.81)
RDW: 16.2 % — ABNORMAL HIGH (ref 11.5–15.5)
WBC: 62.4 10*3/uL (ref 4.0–10.5)
nRBC: 0 % (ref 0.0–0.2)

## 2022-01-20 LAB — COMPREHENSIVE METABOLIC PANEL
ALT: 27 U/L (ref 0–44)
AST: 20 U/L (ref 15–41)
Albumin: 2.8 g/dL — ABNORMAL LOW (ref 3.5–5.0)
Alkaline Phosphatase: 96 U/L (ref 38–126)
Anion gap: 10 (ref 5–15)
BUN: 17 mg/dL (ref 8–23)
CO2: 23 mmol/L (ref 22–32)
Calcium: 8.4 mg/dL — ABNORMAL LOW (ref 8.9–10.3)
Chloride: 102 mmol/L (ref 98–111)
Creatinine, Ser: 0.67 mg/dL (ref 0.61–1.24)
GFR, Estimated: >60 mL/min (ref >60)
Glucose, Bld: 135 mg/dL — ABNORMAL HIGH (ref 70–99)
Potassium: 3.3 mmol/L — ABNORMAL LOW (ref 3.5–5.1)
Sodium: 135 mmol/L (ref 135–145)
Total Bilirubin: 1.2 mg/dL (ref 0.3–1.2)
Total Protein: 5.9 g/dL — ABNORMAL LOW (ref 6.5–8.1)

## 2022-01-20 LAB — LIPASE, BLOOD: Lipase: 26 U/L (ref 11–51)

## 2022-01-20 LAB — URINALYSIS, ROUTINE W REFLEX MICROSCOPIC
Bacteria, UA: NONE SEEN
Bilirubin Urine: NEGATIVE
Glucose, UA: NEGATIVE mg/dL
Ketones, ur: 80 mg/dL — AB
Leukocytes,Ua: NEGATIVE
Nitrite: NEGATIVE
Protein, ur: 30 mg/dL — AB
Specific Gravity, Urine: 1.012 (ref 1.005–1.030)
pH: 6 (ref 5.0–8.0)

## 2022-01-20 LAB — RESP PANEL BY RT-PCR (FLU A&B, COVID) ARPGX2
Influenza A by PCR: NEGATIVE
Influenza B by PCR: NEGATIVE
SARS Coronavirus 2 by RT PCR: NEGATIVE

## 2022-01-20 LAB — TROPONIN I (HIGH SENSITIVITY)
Troponin I (High Sensitivity): 22 ng/L — ABNORMAL HIGH (ref <18)
Troponin I (High Sensitivity): 24 ng/L — ABNORMAL HIGH (ref <18)
Troponin I (High Sensitivity): 25 ng/L — ABNORMAL HIGH (ref <18)

## 2022-01-20 LAB — BASIC METABOLIC PANEL
Anion gap: 7 (ref 5–15)
BUN: 12 mg/dL (ref 8–23)
CO2: 25 mmol/L (ref 22–32)
Calcium: 7.8 mg/dL — ABNORMAL LOW (ref 8.9–10.3)
Chloride: 104 mmol/L (ref 98–111)
Creatinine, Ser: 0.67 mg/dL (ref 0.61–1.24)
GFR, Estimated: >60 mL/min (ref >60)
Glucose, Bld: 109 mg/dL — ABNORMAL HIGH (ref 70–99)
Potassium: 4.4 mmol/L (ref 3.5–5.1)
Sodium: 136 mmol/L (ref 135–145)

## 2022-01-20 LAB — CREATININE, SERUM
Creatinine, Ser: 0.67 mg/dL (ref 0.61–1.24)
GFR, Estimated: >60 mL/min (ref >60)

## 2022-01-20 LAB — APTT: aPTT: 40 seconds — ABNORMAL HIGH (ref 24–36)

## 2022-01-20 LAB — VITAMIN B12: Vitamin B-12: 529 pg/mL (ref 180–914)

## 2022-01-20 LAB — CBC
HCT: 29.6 % — ABNORMAL LOW (ref 39.0–52.0)
Hemoglobin: 9.6 g/dL — ABNORMAL LOW (ref 13.0–17.0)
MCH: 29 pg (ref 26.0–34.0)
MCHC: 32.4 g/dL (ref 30.0–36.0)
MCV: 89.4 fL (ref 80.0–100.0)
Platelets: 239 10*3/uL (ref 150–400)
RBC: 3.31 MIL/uL — ABNORMAL LOW (ref 4.22–5.81)
RDW: 16.2 % — ABNORMAL HIGH (ref 11.5–15.5)
WBC: 54.9 10*3/uL (ref 4.0–10.5)
nRBC: 0 % (ref 0.0–0.2)

## 2022-01-20 LAB — AMMONIA: Ammonia: <10 umol/L (ref 9–35)

## 2022-01-20 LAB — PHOSPHORUS: Phosphorus: 2.5 mg/dL (ref 2.5–4.6)

## 2022-01-20 MED ORDER — METOPROLOL TARTRATE 25 MG PO TABS
25.0000 mg | ORAL_TABLET | Freq: Two times a day (BID) | ORAL | Status: DC
  Administered 2022-01-20: 25 mg via ORAL
  Filled 2022-01-20: qty 1

## 2022-01-20 MED ORDER — ZOLPIDEM TARTRATE 5 MG PO TABS
10.0000 mg | ORAL_TABLET | Freq: Every evening | ORAL | Status: DC | PRN
  Filled 2022-01-20: qty 2

## 2022-01-20 MED ORDER — POTASSIUM CHLORIDE 10 MEQ/100ML IV SOLN: 10.0000 meq | INTRAVENOUS | Status: DC

## 2022-01-20 MED ORDER — SODIUM CHLORIDE 0.9 % IV BOLUS: 1000.0000 mL | Freq: Once | INTRAVENOUS | Status: DC

## 2022-01-20 MED ORDER — ADULT MULTIVITAMIN W/MINERALS CH: 1.0000 | ORAL_TABLET | Freq: Every day | ORAL | Status: DC

## 2022-01-20 MED ORDER — DRONABINOL 2.5 MG PO CAPS
2.5000 mg | ORAL_CAPSULE | Freq: Two times a day (BID) | ORAL | Status: DC
  Administered 2022-01-20 – 2022-01-22 (×5): 2.5 mg via ORAL
  Filled 2022-01-20 (×5): qty 1

## 2022-01-20 MED ORDER — ALBUTEROL SULFATE HFA 108 (90 BASE) MCG/ACT IN AERS: 2.0000 | INHALATION_SPRAY | Freq: Four times a day (QID) | RESPIRATORY_TRACT | Status: DC | PRN

## 2022-01-20 MED ORDER — ACETAMINOPHEN 650 MG RE SUPP: 650.0000 mg | Freq: Four times a day (QID) | RECTAL | Status: DC | PRN

## 2022-01-20 MED ORDER — POTASSIUM CHLORIDE 10 MEQ/100ML IV SOLN
10.0000 meq | INTRAVENOUS | Status: DC
  Administered 2022-01-20 (×3): 10 meq via INTRAVENOUS
  Filled 2022-01-20 (×3): qty 100

## 2022-01-20 MED ORDER — ENSURE ENLIVE PO LIQD
237.0000 mL | Freq: Two times a day (BID) | ORAL | Status: DC
  Administered 2022-01-20 – 2022-01-22 (×3): 237 mL via ORAL
  Filled 2022-01-20 (×6): qty 237

## 2022-01-20 MED ORDER — INFLUENZA VAC A&B SA ADJ QUAD 0.5 ML IM PRSY
0.5000 mL | PREFILLED_SYRINGE | INTRAMUSCULAR | Status: AC
  Administered 2022-01-21: 0.5 mL via INTRAMUSCULAR
  Filled 2022-01-20: qty 0.5

## 2022-01-20 MED ORDER — PRAVASTATIN SODIUM 10 MG PO TABS: 20.0000 mg | ORAL_TABLET | Freq: Every day | ORAL | Status: DC

## 2022-01-20 MED ORDER — LACTATED RINGERS IV BOLUS
1000.0000 mL | INTRAVENOUS | Status: AC
  Administered 2022-01-20 (×2): 1000 mL via INTRAVENOUS

## 2022-01-20 MED ORDER — ALPRAZOLAM 1 MG PO TABS
1.0000 mg | ORAL_TABLET | Freq: Three times a day (TID) | ORAL | Status: DC | PRN
  Administered 2022-01-20: 1 mg via ORAL
  Filled 2022-01-20: qty 1

## 2022-01-20 MED ORDER — OYSTER SHELL CALCIUM/D3 500-5 MG-MCG PO TABS: 1.0000 | ORAL_TABLET | Freq: Every day | ORAL | Status: DC

## 2022-01-20 MED ORDER — FLUTICASONE PROPIONATE 50 MCG/ACT NA SUSP
2.0000 | Freq: Every day | NASAL | Status: DC
  Administered 2022-01-20 – 2022-01-24 (×5): 2 via NASAL
  Filled 2022-01-20 (×2): qty 16

## 2022-01-20 MED ORDER — ONDANSETRON HCL 4 MG/2ML IJ SOLN
4.0000 mg | Freq: Once | INTRAMUSCULAR | Status: AC
  Administered 2022-01-20: 4 mg via INTRAVENOUS
  Filled 2022-01-20: qty 2

## 2022-01-20 MED ORDER — BUPROPION HCL ER (SR) 150 MG PO TB12
150.0000 mg | ORAL_TABLET | Freq: Every day | ORAL | Status: DC
  Administered 2022-01-20 – 2022-01-24 (×5): 150 mg via ORAL
  Filled 2022-01-20 (×5): qty 1

## 2022-01-20 MED ORDER — POTASSIUM CHLORIDE 10 MEQ/100ML IV SOLN
10.0000 meq | Freq: Once | INTRAVENOUS | Status: AC
  Administered 2022-01-20: 10 meq via INTRAVENOUS
  Filled 2022-01-20: qty 100

## 2022-01-20 MED ORDER — MAGNESIUM SULFATE 2 GM/50ML IV SOLN
2.0000 g | Freq: Once | INTRAVENOUS | Status: AC
  Administered 2022-01-20: 2 g via INTRAVENOUS
  Filled 2022-01-20: qty 50

## 2022-01-20 MED ORDER — LACTATED RINGERS IV SOLN: INTRAVENOUS | Status: DC

## 2022-01-20 MED ORDER — SODIUM CHLORIDE 0.9 % IV SOLN: INTRAVENOUS | Status: DC

## 2022-01-20 MED ORDER — METOPROLOL TARTRATE 5 MG/5ML IV SOLN
5.0000 mg | Freq: Once | INTRAVENOUS | Status: AC
  Administered 2022-01-20: 5 mg via INTRAVENOUS
  Filled 2022-01-20: qty 5

## 2022-01-20 MED ORDER — OXYCODONE HCL 5 MG PO TABS
5.0000 mg | ORAL_TABLET | ORAL | Status: DC | PRN
  Administered 2022-01-20 (×2): 5 mg via ORAL
  Filled 2022-01-20 (×3): qty 1

## 2022-01-20 MED ORDER — ENOXAPARIN SODIUM 40 MG/0.4ML IJ SOSY
40.0000 mg | PREFILLED_SYRINGE | INTRAMUSCULAR | Status: DC
  Administered 2022-01-20 – 2022-01-24 (×5): 40 mg via SUBCUTANEOUS
  Filled 2022-01-20 (×5): qty 0.4

## 2022-01-20 MED ORDER — OXYCODONE HCL 5 MG PO TABS: 10.0000 mg | ORAL_TABLET | ORAL | Status: DC | PRN

## 2022-01-20 MED ORDER — ACETAMINOPHEN 325 MG PO TABS: 650.0000 mg | ORAL_TABLET | Freq: Four times a day (QID) | ORAL | Status: DC | PRN

## 2022-01-20 MED ORDER — PROCHLORPERAZINE EDISYLATE 10 MG/2ML IJ SOLN
10.0000 mg | Freq: Four times a day (QID) | INTRAMUSCULAR | Status: DC | PRN
  Administered 2022-01-20 – 2022-01-22 (×5): 10 mg via INTRAVENOUS
  Filled 2022-01-20 (×6): qty 2

## 2022-01-20 NOTE — ED Notes (Signed)
Pt verbalized Swallow of coffee, a swallow of OJ and the smell of breakfast made him vomit.

## 2022-01-20 NOTE — ED Provider Notes (Signed)
12:04 AM  Care of the patient assumed at the change of shift. History of CLL, here for nausea, poor PO intake and confusion since chemo treatment on Thursday. HR has been elevated, seems to be improving with IVF. Not febrile, awaiting labs, IVF continuing.   Clinical Course as of 01/20/22 4144  Nancy Fetter Jan 20, 2022  0005 CBC with leukocytosis consistent with known CLL, improved from previous highs.  [CS]  0043 CMP without significant abnormalities, lipase is normal. Initial Trop is borderline elevated of unclear significance.  [CS]  0100 Patient's HR remains elevated, after 2L saline; wife reports he was unable to keep down his metoprolol today. Will give a dose of IV lopressor. The chemo he received this week was for a new diagnosis of lung cancer, this was his first treatment. Got filgrastim this week as well.  [CS]  3601 HR improved with Lopressor, will discuss admission with the hospitalist for intractable nausea from chemo. [CS]  0203 Spoke with Dr. Josephine Cables, Hospitalist, who will evaluate for admission.  [CS]    Clinical Course User Index [CS] Truddie Hidden, MD      Truddie Hidden, MD 01/20/22 [phone]

## 2022-01-20 NOTE — Progress Notes (Signed)
[facility] PLAN OF CARE NOTE Patient: Alfred Brown [facility] GOT:[id number]   PCP: Redmond School, MD DOB: 18-Aug-1950   DOA: 01/19/2022   DOS: 01/20/2022    Patient was admitted by my colleague earlier on 01/20/2022. I have reviewed the H&P as well as assessment and plan and agree with the same. Important changes in the plan are listed below.  Plan of care: Principal Problem:   Intractable nausea and vomiting Active Problems:   Chronic lymphocytic leukemia (HCC)   COPD (chronic obstructive pulmonary disease) (HCC)   Generalized anxiety disorder   Squamous cell lung cancer, left (HCC)   Hypokalemia   Hypoalbuminemia due to protein-calorie malnutrition (HCC)   Depression   Failure to thrive in adult   Unintentional weight loss   Prolonged QT interval   SVT (supraventricular tachycardia)   Sinus tachycardia Presents with intractable nausea and vomiting.  Has mild hypotension as well as tachycardia. Also hypomagnesemia. Hypokalemia still present as well. Patient has poor p.o. intake ongoing for a while with failure to thrive. Wife reports intermittent confusion.  Will provide additional magnesium, additional potassium. We will recheck the levels. Check x-ray abdomen as well. Due to concerns with dizziness we will get PT evaluation. Patient remains tachycardic and had SVT at the time of admission therefore will remain on telemetry. Will hydrate with IV LR. Pain control with oxycodone.  For his rib cage metastasis. EKG does not show any evidence of atrial fibrillation for now.  Level of care: Telemetry Telemetry reviewed, shows SVT and Continue Telemetry due to SVTs and sinus tachycardia and hypotension  Author: Berle Mull, MD [facility] 01/20/2022 5:42 PM   If 7PM-7AM, please contact night-coverage at [email or web address]

## 2022-01-20 NOTE — H&P (Signed)
History and Physical    Patient: Alfred Brown HFW:[id number] DOB: 1950/06/22 DOA: 01/19/2022 DOS: the patient was seen and examined on 01/20/2022 PCP: Redmond School, MD  Patient coming from: Home  Chief Complaint:  Chief Complaint  Patient presents with   Nausea   HPI: Alfred Brown is a 71 y.o. male with medical history significant of CLL (diagnosed in 2010, now on surveillance), mild COPD, anxiety/depression and left upper lobe squamous cell lung cancer with metastasis to bone who presents to the emergency department accompanied by wife due to 3-day onset of generalized weakness and nausea.  Patient had first chemotherapy for the lung cancer on Wednesday (10/18) .  On the following day (Thursday -10/19), patient had 2 episodes of watery stool, he took Imodium and symptoms resolved by next morning.  On Friday (10/20), he was nauseous and had an episode of nonbloody vomiting. 1 L of IV NS was given at the cancer center   He continued to take his nausea medication, but continues to be nauseous and had no taste for food, he said that food smells bad, he has also had decreased fluid intake.  Patient was noted to be disoriented today and patient was witnessed taking a razor to brush his teeth.  This led to the wife decided to take the patient to the ED for further evaluation and management.  ED Course:  In the emergency department, he was tachycardic and intermittently tachypneic, BP on arrival was 114/62, O2 sat was 97% on room air.  Work-up in the ED showed leukocytosis and normocytic anemia.  Troponin x122, lipase 26, BMP was normal except for potassium 3.3 hypoalbuminemia, urinalysis was normal.  Influenza A, B, SARS coronavirus 2 was negative. CT head without contrast showed no acute intracranial abnormality Chest x-ray showed no acute cardiopulmonary process and a 2 cm left upper lobe subpleural nodule He was treated with Lopressor 5 mg IV and Zofran.  IV hydration was provided.   Hospitalist was asked to admit patient for further evaluation and management.  Review of Systems: Review of systems as noted in the HPI. All other systems reviewed and are negative.   Past Medical History:  Diagnosis Date   Aortic valve disorder    Mild insufficiency   Chronic lymphocytic leukemia ([place])    01/2012: WBC-90.2, H&H-15.1/45.3, platelets-185 03/31/12: Verified with Dr. Tressie Stalker that no precautions or modification of medical regime are required prior to orthopaedic surgery.    CLL (chronic lymphocytic leukemia) (HCC)    CMV (cytomegalovirus) (HCC)    COPD (chronic obstructive pulmonary disease) (HCC)    Depression with anxiety    DJD (degenerative joint disease)    GERD (gastroesophageal reflux disease)    Hyperlipidemia    Hypogammaglobulinemia ([place]) 09/23/2019   Lipoma    left chest wall   Palpitations 2004   PVCs; borderline stress nuclear in 2004, negative in 2007; Echo 2007; AV-sclerotic, very mild AI   Pneumonia    Right bundle branch block    + left posterior fascicular block   Tobacco abuse    80 pack years   Past Surgical History:  Procedure Laterality Date   COLONOSCOPY  01/31/2012   Negative screening study   GANGLION CYST EXCISION  04/02/1995   left wrist   IR IMAGING GUIDED PORT INSERTION  01/14/2022   LIPOMA EXCISION Left 10/11/2021   chest wall   ROTATOR CUFF REPAIR  04/02/1995   right   SHOULDER ARTHROSCOPY WITH SUBACROMIAL DECOMPRESSION  04/09/2012   Procedure: SHOULDER  ARTHROSCOPY WITH SUBACROMIAL DECOMPRESSION;  Surgeon: Ninetta Lights, MD;  Location: [facility];  Service: Orthopedics;  Laterality: Left;  LEFT SHOULDER ARTHROSCOPY, SUBACROMIAL DECOMPRESSION, PARTIAL ACROMIOPLASTY WITH CORACOACROMIAL RELEASE, DISTAL CLAVICULECTOMY WITH ROTATOR CUFF REPAIR, DEBRIDEMENT OF LABRUM    Social History:  reports that he has quit smoking. His smoking use included cigarettes. He started smoking about 58 years ago. He has a 45.00  pack-year smoking history. He has never used smokeless tobacco. He reports that he does not drink alcohol and does not use drugs.   No Known Allergies  Family History  Problem Relation Age of Onset   Stroke Mother    Heart attack Father    Cancer Sister        colon   Colon cancer Sister    Cancer Brother        2 brothers died with lung cancer     Prior to Admission medications   Medication Sig Start Date End Date Taking? Authorizing Provider  acetaminophen (TYLENOL) 500 MG tablet Take 500-1,000 mg by mouth every 6 (six) hours as needed for moderate pain.    [provider]  albuterol (VENTOLIN HFA) 108 (90 Base) MCG/ACT inhaler 2 puffs every 4 hours as needed if you can't catch your breath 12/04/21   Tanda Rockers, MD  ALPRAZolam Duanne Moron) 1 MG tablet Take 1 mg by mouth 3 (three) times daily as needed for anxiety.     [provider]  aspirin 325 MG tablet Take 325 mg by mouth daily.    [provider]  buPROPion (WELLBUTRIN SR) 150 MG 12 hr tablet Take 150 mg by mouth daily.    [provider]  Calcium Carb-Cholecalciferol (CALCIUM 600 + D PO) Take 1 tablet by mouth daily.    [provider]  CARBOPLATIN IV Inject into the vein every 21 ( twenty-one) days. 01/16/22   [provider]  celecoxib (CELEBREX) 100 MG capsule Take 100 mg by mouth 2 (two) times daily.    [provider]  clotrimazole-betamethasone (LOTRISONE) cream Apply 1 application  topically daily as needed (rash). 06/19/15   [provider]  diltiazem (CARDIZEM) 30 MG tablet Take 30 mg by mouth as needed.    [provider]  fish oil-omega-3 fatty acids 1000 MG capsule Take 1 g by mouth daily.    [provider]  fluticasone (FLONASE) 50 MCG/ACT nasal spray Place 2 sprays into the nose daily.    [provider]  lidocaine (LIDODERM) 5 % Place 1 patch onto the skin daily. Remove and Discard patch within 12 hours. Patient  taking differently: Place 1 patch onto the skin daily as needed (pain). Remove and Discard patch within 12 hours. 11/28/21   Derek Jack, MD  lidocaine-prilocaine (EMLA) cream Apply a small amount to port a cath site and cover with plastic wrap 1 hour prior to infusion appointments 01/15/22   Derek Jack, MD  Loratadine 10 MG CAPS Take 10 mg by mouth daily.    [provider]  lovastatin (MEVACOR) 20 MG tablet Take 20 mg by mouth daily. 09/29/14   [provider]  metoprolol tartrate (LOPRESSOR) 25 MG tablet Take 1 tablet (25 mg total) by mouth 2 (two) times daily. Patient taking differently: Take 37.5 mg by mouth 2 (two) times daily. 12/20/21   Orson Eva, MD  Multiple Vitamin (MULTIVITAMIN) tablet Take 1 tablet by mouth daily.    [provider]  niacin (NIASPAN) 500 MG CR tablet  Take 500 mg by mouth daily. 09/29/14   [provider]  oxyCODONE (OXYCONTIN) 20 mg 12 hr tablet Take 1 tablet (20 mg total) by mouth every 12 (twelve) hours. 01/07/22   Derek Jack, MD  Oxycodone HCl 10 MG TABS Take 1 tablet (10 mg total) by mouth every 4 (four) hours as needed. 12/26/21   Derek Jack, MD  PACLITAXEL IV Inject into the vein every 21 ( twenty-one) days. 01/16/22   [provider]  PEMBROLIZUMAB IV Inject into the vein every 21 ( twenty-one) days. 01/16/22   [provider]  polyethylene glycol (MIRALAX / GLYCOLAX) 17 g packet Take 17 g by mouth daily as needed for moderate constipation.    [provider]  prochlorperazine (COMPAZINE) 10 MG tablet Take 1 tablet (10 mg total) by mouth every 6 (six) hours as needed for nausea or vomiting. 01/15/22   Derek Jack, MD  prochlorperazine (COMPAZINE) 5 MG tablet Take 1 tablet (5 mg total) by mouth every 6 (six) hours as needed for nausea or vomiting. 12/20/21   Tat, Shanon Brow, MD  sennosides-docusate sodium (SENOKOT-S) 8.6-50 MG tablet Take 2 tablets by mouth 2 (two)  times daily.    [provider]  valACYclovir (VALTREX) 500 MG tablet Take 500 mg by mouth daily.    [provider]  zolpidem (AMBIEN) 10 MG tablet Take 10 mg by mouth at bedtime as needed for sleep. 12/24/21   [provider]    Physical Exam: BP (!) 110/57   Pulse (!) 103   Temp 99.6 F (37.6 C) (Oral)   Resp 18   Ht 6\' 1"  (1.854 m)   Wt 68 kg   SpO2 97%   BMI 19.79 kg/m   General: 71 y.o. year-old male ill appearing, but in no acute distress.  Alert and oriented x 2 (time and place). HEENT: NCAT, EOMI, dry mucous membrane Neck: Supple, trachea medial Cardiovascular: Tachycardia.  Regular rate and rhythm with no rubs or gallops.  No thyromegaly or JVD noted.  No lower extremity edema. 2/4 pulses in all 4 extremities. Respiratory: Clear to auscultation with no wheezes or rales. Good inspiratory effort. Abdomen: Soft, nontender nondistended with normal bowel sounds x4 quadrants. Muskuloskeletal: No cyanosis, clubbing or edema noted bilaterally Neuro: CN II-XII intact, sensation, reflexes intact Skin: No ulcerative lesions noted or rashes Psychiatry:Mood is appropriate for condition and setting          Labs on Admission:  Basic Metabolic Panel: Recent Labs  Lab 01/16/22 0803 01/19/22 2325  NA 136 135  K 4.1 3.3*  CL 103 102  CO2 25 23  GLUCOSE 127* 135*  BUN 18 17  CREATININE 0.90 0.67  CALCIUM 8.6* 8.4*  MG 1.9  --    Liver Function Tests: Recent Labs  Lab 01/16/22 0803 01/19/22 2325  AST 20 20  ALT 29 27  ALKPHOS 98 96  BILITOT 0.5 1.2  PROT 5.9* 5.9*  ALBUMIN 2.7* 2.8*   Recent Labs  Lab 01/19/22 2325  LIPASE 26   No results for input(s): "AMMONIA" in the last 168 hours. CBC: Recent Labs  Lab 01/16/22 0803 01/19/22 2325  WBC 54.1* 62.4*  NEUTROABS 5.3 15.3*  HGB 10.3* 10.9*  HCT 32.2* 33.2*  MCV 90.2 88.1  PLT 279 279   Cardiac Enzymes: No results for input(s): "CKTOTAL", "CKMB", "CKMBINDEX", "TROPONINI" in the  last 168 hours.  BNP (last 3 results) No results for input(s): "BNP" in the last 8760 hours.  ProBNP (last  3 results) No results for input(s): "PROBNP" in the last 8760 hours.  CBG: No results for input(s): "GLUCAP" in the last 168 hours.  Radiological Exams on Admission: CT Head Wo Contrast  Result Date: 01/19/2022 CLINICAL DATA:  Mental status change, unknown cause EXAM: CT HEAD WITHOUT CONTRAST TECHNIQUE: Contiguous axial images were obtained from the base of the skull through the vertex without intravenous contrast. RADIATION DOSE REDUCTION: This exam was performed according to the departmental dose-optimization program which includes automated exposure control, adjustment of the mA and/or kV according to patient size and/or use of iterative reconstruction technique. COMPARISON:  CT max face 11/07/2021, MRI head 01/13/2013 FINDINGS: Brain: No evidence of large-territorial acute infarction. No parenchymal hemorrhage. No mass lesion. No extra-axial collection. No mass effect or midline shift. No hydrocephalus. Basilar cisterns are patent. Vascular: No hyperdense vessel. Atherosclerotic calcifications are present within the cavernous internal carotid arteries. Skull: No acute fracture or focal lesion. Sinuses/Orbits: Paranasal sinuses and mastoid air cells are clear. The orbits are unremarkable. Other: None. IMPRESSION: No acute intracranial abnormality. Electronically Signed   By: Iven Finn M.D.   On: 01/19/2022 22:59   DG Chest 1 View  Result Date: 01/19/2022 CLINICAL DATA:  Chest pain. EXAM: CHEST  1 VIEW COMPARISON:  Chest radiograph dated 12/19/2018. PET CT dated 11/22/2021. FINDINGS: Right-sided Port-A-Cath with tip at the cavoatrial junction. A 2 cm left upper lobe subpleural nodule. No consolidative changes. No pleural effusion pneumothorax. The cardiac silhouette is within limits. Atherosclerotic calcification of the aorta. No acute osseous pathology. IMPRESSION: 1. No acute  cardiopulmonary process. 2. A 2 cm left upper lobe subpleural nodule. Electronically Signed   By: Anner Crete M.D.   On: 01/19/2022 22:47    EKG: I independently viewed the EKG done and my findings are as followed: Sinus tachycardia at a rate of 154 bpm with prolonged QTc of 535 ms  Assessment/Plan Present on Admission:  Intractable nausea and vomiting  Chronic lymphocytic leukemia (HCC)  Generalized anxiety disorder  Squamous cell lung cancer, left (HCC)  Principal Problem:   Intractable nausea and vomiting Active Problems:   Chronic lymphocytic leukemia (HCC)   COPD (chronic obstructive pulmonary disease) (HCC)   Generalized anxiety disorder   Squamous cell lung cancer, left (HCC)   Hypokalemia   Hypoalbuminemia due to protein-calorie malnutrition (HCC)   Depression   Failure to thrive in adult   Unintentional weight loss   Prolonged QT interval   SVT (supraventricular tachycardia)   Sinus tachycardia  Intractable nausea without vomiting Continue Compazine  Dehydration Continue IV hydration  Leukocytosis secondary to CLL WBC 62.4.  This was 94.2 on 01/07/2022 Patient follows with Dr. Delton Coombes  Hypokalemia K+ 3.3, this was replenished  Squamous small cell lung cancer with metastasis to the bone  CT chest (11/07/2021): Irregular bandlike mass traverses the left upper lobe.  1.4 cm solid nodule is identified adjacent to 1.2 cm cystic component in the posterior left upper lobe.  There is prominent left hilar density suspicious for adenopathy.  Cortical destruction of the right anterior fourth rib suspicious for metastatic disease. Right iliac bone biopsy (12/06/2021): Metastatic well to moderately differentiated squamous cell carcinoma. Chemoimmunotherapy with carboplatin, paclitaxel and pembrolizumab was recommended by his oncologist. Continue oxycodone as needed per home regimen Patient follows with Dr. Delton Coombes  Elevated troponin possibly secondary to type II  demand ischemia Troponin x1 - 22, patient denies any chest pain.  Continue to trend troponin.  Hypoalbuminemia secondary to moderate protein calorie malnutrition Albumin  2.8, protein supplement to be provided  Failure to thrive in adult Unintentional weight loss Wife states that patient has lost about 25 pounds in 4 months This is possibly secondary to patient's lung cancer history Dietitian will be consulted and we shall await further recommendations  Prolonged QT interval QTc 535 ms Avoid QT prolonging drugs Magnesium level will be checked Continue telemetry  Mixed hyperlipidemia Continue pravastatin  SVT/sinus tachycardia Continue metoprolol  Anxiety/depression Continuing Xanax and bupropion  COPD (not in exacerbation) Continue albuterol as needed  Other home meds: Calcium 600 + D, Flonase, multivitamin  Insomnia Continue Ambien as needed  DVT prophylaxis: Lovenox  Code Status: Full code  Consults: None  Family Communication: Wife at bedside  Severity of Illness: The appropriate patient status for this patient is INPATIENT. Inpatient status is judged to be reasonable and necessary in order to provide the required intensity of service to ensure the patient's safety. The patient's presenting symptoms, physical exam findings, and initial radiographic and laboratory data in the context of their chronic comorbidities is felt to place them at high risk for further clinical deterioration. Furthermore, it is not anticipated that the patient will be medically stable for discharge from the hospital within 2 midnights of admission.   * I certify that at the point of admission it is my clinical judgment that the patient will require inpatient hospital care spanning beyond 2 midnights from the point of admission due to high intensity of service, high risk for further deterioration and high frequency of surveillance required.*  Author: Bernadette Hoit, DO 01/20/2022 4:06  AM  For on call review [email or web address].

## 2022-01-20 NOTE — ED Notes (Signed)
Pt is hard of hearing on his right side. Pt can hear better on his left side.

## 2022-01-20 NOTE — ED Provider Notes (Signed)
[facility] EMERGENCY DEPARTMENT Provider Note  CSN: [id number] [place] date & time: 01/19/22 2132  Chief Complaint(s) Nausea  HPI Alfred Brown is a 71 y.o. male with history of CLL, on chemotherapy presenting to the emergency department generalized weakness.  Patient had chemotherapy Wednesday, since Thursday has had nausea and no appetite.  Wife also reports somewhat mildly confused.  Patient reports chronic chest pain somehow related to his cancer but denies any change in the symptom.  No shortness of breath, no vomiting, no fevers or chills.  No dysuria, no abdominal pain, no headaches, dizziness.  He went to the chemotherapy center on Friday and had 1 L of fluids but reports his symptoms have persisted.   Past Medical History Past Medical History:  Diagnosis Date   Aortic valve disorder    Mild insufficiency   Chronic lymphocytic leukemia ([place])    01/2012: WBC-90.2, H&H-15.1/45.3, platelets-185 03/31/12: Verified with Dr. Tressie Stalker that no precautions or modification of medical regime are required prior to orthopaedic surgery.    CLL (chronic lymphocytic leukemia) (HCC)    CMV (cytomegalovirus) (HCC)    COPD (chronic obstructive pulmonary disease) (HCC)    Depression with anxiety    DJD (degenerative joint disease)    GERD (gastroesophageal reflux disease)    Hyperlipidemia    Hypogammaglobulinemia ([place]) 09/23/2019   Lipoma    left chest wall   Palpitations 2004   PVCs; borderline stress nuclear in 2004, negative in 2007; Echo 2007; AV-sclerotic, very mild AI   Pneumonia    Right bundle branch block    + left posterior fascicular block   Tobacco abuse    80 pack years   Patient Active Problem List   Diagnosis Date Noted   Squamous cell lung cancer, left ([place]) 01/07/2022   Intractable vomiting 12/19/2021   Intractable nausea and vomiting 12/18/2021   Leukocytosis 12/18/2021   Cancer associated pain 12/18/2021   Generalized anxiety disorder 12/18/2021   Rib pain on  right side 11/22/2021   Mass of left lung 11/15/2021   Lipoma of anterior chest wall 09/20/2021   Peyronie's disease 11/24/2019   Hypogammaglobulinemia ([place]) 09/23/2019   COPD GOLD 0 08/05/2019   Acute exacerbation of COPD with asthma ([place]) 06/03/2019   Acute and chronic respiratory failure with hypoxia ([place]) 06/03/2019   History of COVID-19 dx 05-09-19 06/03/2019   Pneumonia due to COVID-19 virus 06/02/2019   GERD (gastroesophageal reflux disease)    Hyperlipidemia    Palpitations    Chronic lymphocytic leukemia (HCC)    Aortic valve disorder    Right bundle branch block    Tobacco abuse    Home Medication(s) Prior to Admission medications   Medication Sig Start Date End Date Taking? Authorizing Provider  acetaminophen (TYLENOL) 500 MG tablet Take 500-1,000 mg by mouth every 6 (six) hours as needed for moderate pain.    [provider]  albuterol (VENTOLIN HFA) 108 (90 Base) MCG/ACT inhaler 2 puffs every 4 hours as needed if you can't catch your breath 12/04/21   Tanda Rockers, MD  ALPRAZolam Duanne Moron) 1 MG tablet Take 1 mg by mouth 3 (three) times daily as needed for anxiety.     [provider]  aspirin 325 MG tablet Take 325 mg by mouth daily.    [provider]  buPROPion (WELLBUTRIN SR) 150 MG 12 hr tablet Take 150 mg by mouth daily.    [provider]  Calcium Carb-Cholecalciferol (CALCIUM 600 + D PO) Take 1 tablet by  mouth daily.    [provider]  CARBOPLATIN IV Inject into the vein every 21 ( twenty-one) days. 01/16/22   [provider]  celecoxib (CELEBREX) 100 MG capsule Take 100 mg by mouth 2 (two) times daily.    [provider]  clotrimazole-betamethasone (LOTRISONE) cream Apply 1 application  topically daily as needed (rash). 06/19/15   [provider]  diltiazem (CARDIZEM) 30 MG tablet Take 30 mg by mouth as needed.    [provider]  fish oil-omega-3 fatty acids 1000 MG capsule Take 1 g by  mouth daily.    [provider]  fluticasone (FLONASE) 50 MCG/ACT nasal spray Place 2 sprays into the nose daily.    [provider]  lidocaine (LIDODERM) 5 % Place 1 patch onto the skin daily. Remove and Discard patch within 12 hours. Patient taking differently: Place 1 patch onto the skin daily as needed (pain). Remove and Discard patch within 12 hours. 11/28/21   Derek Jack, MD  lidocaine-prilocaine (EMLA) cream Apply a small amount to port a cath site and cover with plastic wrap 1 hour prior to infusion appointments 01/15/22   Derek Jack, MD  Loratadine 10 MG CAPS Take 10 mg by mouth daily.    [provider]  lovastatin (MEVACOR) 20 MG tablet Take 20 mg by mouth daily. 09/29/14   [provider]  metoprolol tartrate (LOPRESSOR) 25 MG tablet Take 1 tablet (25 mg total) by mouth 2 (two) times daily. Patient taking differently: Take 37.5 mg by mouth 2 (two) times daily. 12/20/21   Orson Eva, MD  Multiple Vitamin (MULTIVITAMIN) tablet Take 1 tablet by mouth daily.    [provider]  niacin (NIASPAN) 500 MG CR tablet Take 500 mg by mouth daily. 09/29/14   [provider]  oxyCODONE (OXYCONTIN) 20 mg 12 hr tablet Take 1 tablet (20 mg total) by mouth every 12 (twelve) hours. 01/07/22   Derek Jack, MD  Oxycodone HCl 10 MG TABS Take 1 tablet (10 mg total) by mouth every 4 (four) hours as needed. 12/26/21   Derek Jack, MD  PACLITAXEL IV Inject into the vein every 21 ( twenty-one) days. 01/16/22   [provider]  PEMBROLIZUMAB IV Inject into the vein every 21 ( twenty-one) days. 01/16/22   [provider]  polyethylene glycol (MIRALAX / GLYCOLAX) 17 g packet Take 17 g by mouth daily as needed for moderate constipation.    [provider]  prochlorperazine (COMPAZINE) 10 MG tablet Take 1 tablet (10 mg total) by mouth every 6 (six) hours as needed for nausea or vomiting. 01/15/22    Derek Jack, MD  prochlorperazine (COMPAZINE) 5 MG tablet Take 1 tablet (5 mg total) by mouth every 6 (six) hours as needed for nausea or vomiting. 12/20/21   Tat, Shanon Brow, MD  sennosides-docusate sodium (SENOKOT-S) 8.6-50 MG tablet Take 2 tablets by mouth 2 (two) times daily.    [provider]  valACYclovir (VALTREX) 500 MG tablet Take 500 mg by mouth daily.    [provider]  zolpidem (AMBIEN) 10 MG tablet Take 10 mg by mouth at bedtime as needed for sleep. 12/24/21   [provider]  Past Surgical History Past Surgical History:  Procedure Laterality Date   COLONOSCOPY  01/31/2012   Negative screening study   GANGLION CYST EXCISION  04/02/1995   left wrist   IR IMAGING GUIDED PORT INSERTION  01/14/2022   LIPOMA EXCISION Left 10/11/2021   chest wall   ROTATOR CUFF REPAIR  04/02/1995   right   SHOULDER ARTHROSCOPY WITH SUBACROMIAL DECOMPRESSION  04/09/2012   Procedure: SHOULDER ARTHROSCOPY WITH SUBACROMIAL DECOMPRESSION;  Surgeon: Ninetta Lights, MD;  Location: [facility];  Service: Orthopedics;  Laterality: Left;  LEFT SHOULDER ARTHROSCOPY, SUBACROMIAL DECOMPRESSION, PARTIAL ACROMIOPLASTY WITH CORACOACROMIAL RELEASE, DISTAL CLAVICULECTOMY WITH ROTATOR CUFF REPAIR, DEBRIDEMENT OF LABRUM   Family History Family History  Problem Relation Age of Onset   Stroke Mother    Heart attack Father    Cancer Sister        colon   Colon cancer Sister    Cancer Brother        2 brothers died with lung cancer    Social History Social History   Tobacco Use   Smoking status: Former    Packs/day: 1.00    Years: 45.00    Total pack years: 45.00    Types: Cigarettes    Start date: 12/03/1963   Smokeless tobacco: Never   Tobacco comments:    Pt states he smokes almost a whole pack daily. 12/04/21  Vaping Use    Vaping Use: Never used  Substance Use Topics   Alcohol use: No    Alcohol/week: 0.0 standard drinks of alcohol   Drug use: No   Allergies Patient has no known allergies.  Review of Systems Review of Systems  All other systems reviewed and are negative.   Physical Exam Vital Signs  I have reviewed the triage vital signs BP 125/77   Pulse (!) 121   Temp 98.1 F (36.7 C) (Oral)   Resp (!) 23   Ht 6\' 1"  (1.854 m)   Wt 68 kg   SpO2 97%   BMI 19.79 kg/m  Physical Exam Vitals and nursing note reviewed.  Constitutional:      General: He is not in acute distress.    Appearance: Normal appearance.  HENT:     Mouth/Throat:     Mouth: Mucous membranes are dry.  Eyes:     Conjunctiva/sclera: Conjunctivae normal.  Cardiovascular:     Rate and Rhythm: Regular rhythm. Tachycardia present.  Pulmonary:     Effort: Pulmonary effort is normal. No respiratory distress.     Breath sounds: Normal breath sounds.  Abdominal:     General: Abdomen is flat.     Palpations: Abdomen is soft.     Tenderness: There is no abdominal tenderness.  Musculoskeletal:     Right lower leg: No edema.     Left lower leg: No edema.  Skin:    General: Skin is warm and dry.     Capillary Refill: Capillary refill takes less than 2 seconds.  Neurological:     Mental Status: He is alert. Mental status is at baseline.     Comments: Oriented to person, time, place, and situation  Psychiatric:        Mood and Affect: Mood normal.        Behavior: Behavior normal.     ED Results and Treatments Labs (all labs ordered are listed, but only abnormal results are displayed) Labs Reviewed  CBC WITH DIFFERENTIAL/PLATELET - Abnormal; Notable for the following components:  Result Value   WBC 62.4 (*)    RBC 3.77 (*)    Hemoglobin 10.9 (*)    HCT 33.2 (*)    RDW 16.2 (*)    Neutro Abs 15.3 (*)    Lymphs Abs 42.3 (*)    Abs Immature Granulocytes 4.56 (*)    All other components within normal limits   CULTURE, BLOOD (ROUTINE X 2)  CULTURE, BLOOD (ROUTINE X 2)  RESP PANEL BY RT-PCR (FLU A&B, COVID) ARPGX2  COMPREHENSIVE METABOLIC PANEL  LIPASE, BLOOD  URINALYSIS, ROUTINE W REFLEX MICROSCOPIC  TROPONIN I (HIGH SENSITIVITY)                                                                                                                          Radiology CT Head Wo Contrast  Result Date: 01/19/2022 CLINICAL DATA:  Mental status change, unknown cause EXAM: CT HEAD WITHOUT CONTRAST TECHNIQUE: Contiguous axial images were obtained from the base of the skull through the vertex without intravenous contrast. RADIATION DOSE REDUCTION: This exam was performed according to the departmental dose-optimization program which includes automated exposure control, adjustment of the mA and/or kV according to patient size and/or use of iterative reconstruction technique. COMPARISON:  CT max face 11/07/2021, MRI head 01/13/2013 FINDINGS: Brain: No evidence of large-territorial acute infarction. No parenchymal hemorrhage. No mass lesion. No extra-axial collection. No mass effect or midline shift. No hydrocephalus. Basilar cisterns are patent. Vascular: No hyperdense vessel. Atherosclerotic calcifications are present within the cavernous internal carotid arteries. Skull: No acute fracture or focal lesion. Sinuses/Orbits: Paranasal sinuses and mastoid air cells are clear. The orbits are unremarkable. Other: None. IMPRESSION: No acute intracranial abnormality. Electronically Signed   By: Iven Finn M.D.   On: 01/19/2022 22:59   DG Chest 1 View  Result Date: 01/19/2022 CLINICAL DATA:  Chest pain. EXAM: CHEST  1 VIEW COMPARISON:  Chest radiograph dated 12/19/2018. PET CT dated 11/22/2021. FINDINGS: Right-sided Port-A-Cath with tip at the cavoatrial junction. A 2 cm left upper lobe subpleural nodule. No consolidative changes. No pleural effusion pneumothorax. The cardiac silhouette is within limits. Atherosclerotic  calcification of the aorta. No acute osseous pathology. IMPRESSION: 1. No acute cardiopulmonary process. 2. A 2 cm left upper lobe subpleural nodule. Electronically Signed   By: Anner Crete M.D.   On: 01/19/2022 22:47    Pertinent labs & imaging results that were available during my care of the patient were reviewed by me and considered in my medical decision making (see MDM for details).  Medications Ordered in ED Medications  sodium chloride 0.9 % bolus 1,000 mL (1,000 mLs Intravenous New Bag/Given 01/19/22 2324)  Procedures .Critical Care  Performed by: Cristie Hem, MD Authorized by: Cristie Hem, MD   Critical care provider statement:    Critical care time (minutes):  30   Critical care end time:  01/20/2022 12:09 AM   Critical care time was exclusive of:  Separately billable procedures and treating other patients   Critical care was necessary to treat or prevent imminent or life-threatening deterioration of the following conditions:  Dehydration, circulatory failure and cardiac failure   Critical care was time spent personally by me on the following activities:  Development of treatment plan with patient or surrogate, discussions with consultants, evaluation of patient's response to treatment, examination of patient, ordering and review of laboratory studies, ordering and review of radiographic studies, ordering and performing treatments and interventions, pulse oximetry, re-evaluation of patient's condition and review of old charts   (including critical care time)  Medical Decision Making / ED Course   MDM:  71 year old male presenting to the emergency department with nausea and decreased appetite.  Patient with very elevated heart rate, otherwise well-appearing, hemodynamically stable with reassuring blood pressure.  Afebrile.   Exam nonfocal.  Differential includes severe dehydration, chemotherapy side effect, occult infectious process, toxic or metabolic abnormality.  Obtain lab work-up including blood cultures.  Imaging of the head and chest without acute process.  Heart rate has improved with IV fluid.  Given severe dehydration and significant tachycardia patient may need admission.  Signed out to oncoming provider pending remainder of laboratory work-up.  Clinical Course as of 01/20/22 0010  Sun Jan 20, 2022  0005 CBC with leukocytosis consistent with known CLL, improved from previous highs.  [CS]    Clinical Course User Index [CS] Truddie Hidden, MD     Additional history obtained: -Additional history obtained from family -External records from outside source obtained and reviewed including: Chart review including previous notes, labs, imaging, consultation notes including infusion visit 01/18/22   Lab Tests: -I ordered, reviewed, and interpreted labs.   The pertinent results include:   Labs Reviewed  CBC WITH DIFFERENTIAL/PLATELET - Abnormal; Notable for the following components:      Result Value   WBC 62.4 (*)    RBC 3.77 (*)    Hemoglobin 10.9 (*)    HCT 33.2 (*)    RDW 16.2 (*)    Neutro Abs 15.3 (*)    Lymphs Abs 42.3 (*)    Abs Immature Granulocytes 4.56 (*)    All other components within normal limits  CULTURE, BLOOD (ROUTINE X 2)  CULTURE, BLOOD (ROUTINE X 2)  RESP PANEL BY RT-PCR (FLU A&B, COVID) ARPGX2  COMPREHENSIVE METABOLIC PANEL  LIPASE, BLOOD  URINALYSIS, ROUTINE W REFLEX MICROSCOPIC  TROPONIN I (HIGH SENSITIVITY)    Notable for elevated WBC count consistent with CLL  EKG   EKG Interpretation  Date/Time:  Saturday January 19 2022 23:00:26 EDT Ventricular Rate:  154 PR Interval:  204 QRS Duration: 146 QT Interval:  335 QTC Calculation: 535 R Axis:   244 Text Interpretation: Sinus tachycardia (RBBB and left posterior fascicular block) Confirmed by Garnette Gunner  [phone]) on 01/20/2022 12:08:33 AM         Imaging Studies ordered: I ordered imaging studies including CXR, CT head On my interpretation imaging demonstrates no acute process I independently visualized and interpreted imaging. I agree with the radiologist interpretation   Medicines ordered and prescription drug management: Meds ordered this encounter  Medications   sodium chloride 0.9 % bolus 1,000 mL    -  I have reviewed the patients home medicines and have made adjustments as needed    Cardiac Monitoring: The patient was maintained on a cardiac monitor.  I personally viewed and interpreted the cardiac monitored which showed an underlying rhythm of: sinus tachycardia  Social Determinants of Health:  Diagnosis or treatment significantly limited by social determinants of health: former smoker   Reevaluation: After the interventions noted above, I reevaluated the patient and found that they have improved  Co morbidities that complicate the patient evaluation  Past Medical History:  Diagnosis Date   Aortic valve disorder    Mild insufficiency   Chronic lymphocytic leukemia ([place])    01/2012: WBC-90.2, H&H-15.1/45.3, platelets-185 03/31/12: Verified with Dr. Tressie Stalker that no precautions or modification of medical regime are required prior to orthopaedic surgery.    CLL (chronic lymphocytic leukemia) ([facility])    CMV (cytomegalovirus) (HCC)    COPD (chronic obstructive pulmonary disease) (HCC)    Depression with anxiety    DJD (degenerative joint disease)    GERD (gastroesophageal reflux disease)    Hyperlipidemia    Hypogammaglobulinemia ([place]) 09/23/2019   Lipoma    left chest wall   Palpitations 2004   PVCs; borderline stress nuclear in 2004, negative in 2007; Echo 2007; AV-sclerotic, very mild AI   Pneumonia    Right bundle branch block    + left posterior fascicular block   Tobacco abuse    80 pack years      Dispostion: Disposition decision including need for  hospitalization was considered, and patient disposition pending at sign out to oncoming provider    Final Clinical Impression(s) / ED Diagnoses Final diagnoses:  Dehydration     This chart was dictated using voice recognition software.  Despite best efforts to proofread,  errors can occur which can change the documentation meaning.    Cristie Hem, MD 01/20/22 0010

## 2022-01-20 NOTE — Plan of Care (Signed)
  Problem: Acute Rehab PT Goals(only PT should resolve) Goal: Pt Will Go Supine/Side To Sit Outcome: Progressing Flowsheets (Taken 01/20/2022 1241) Pt will go Supine/Side to Sit: with supervision Goal: Patient Will Transfer Sit To/From Stand Outcome: Progressing Flowsheets (Taken 01/20/2022 1241) Patient will transfer sit to/from stand: with supervision Goal: Pt Will Transfer Bed To Chair/Chair To Bed Outcome: Progressing Flowsheets (Taken 01/20/2022 1241) Pt will Transfer Bed to Chair/Chair to Bed: with supervision Goal: Pt Will Ambulate Outcome: Progressing Flowsheets (Taken 01/20/2022 1241) Pt will Ambulate:  50 feet  with supervision

## 2022-01-20 NOTE — Evaluation (Signed)
Physical Therapy Evaluation Patient Details Name: Alfred Brown MRN: [id number] DOB: [birth year] Today's Date: 01/20/2022  History of Present Illness  Alfred Brown is a 71 y.o. male with medical history significant of CLL (diagnosed in 2010, now on surveillance), mild COPD, anxiety/depression and left upper lobe squamous cell lung cancer with metastasis to bone who presents to the emergency department accompanied by wife due to 3-day onset of generalized weakness and nausea.  Patient had first chemotherapy for the lung cancer on Wednesday (10/18) .  On the following day (Thursday -10/19), patient had 2 episodes of watery stool, he took Imodium and symptoms resolved by next morning.  On Friday (10/20), he was nauseous and had an episode of nonbloody vomiting. 1 L of IV NS was given at the cancer center   He continued to take his nausea medication, but continues to be nauseous and had no taste for food, he said that food smells bad, he has also had decreased fluid intake.  Patient was noted to be disoriented today and patient was witnessed taking a razor to brush his teeth.  This led to the wife decided to take the patient to the ED for further evaluation and management.   Patient lying in bed on therapist arrival; in ED.  Wife present at bedside.  Patient able to turn in bed and sit up on edge of the bed with minimal assistance.  Patient able to sit on edge of bed with 1 hand and both feet supporting with CGA/SBA from therapist He reports general weakness but no pain.  Sit to stand with CGA from therapist; no loss of balance or swaying noted. Did not attempt to walk as patient HR up to 155-160 on standing.  Patient will benefit from continued skilled therapy services during the remainder of his hospital stay and at the next recommended venue of care to address deficits and promote return to optimal function.      Clinical Impression         Recommendations for follow up therapy are one component of a  multi-disciplinary discharge planning process, led by the attending physician.  Recommendations may be updated based on patient status, additional functional criteria and insurance authorization.  Follow Up Recommendations Home health PT      Assistance Recommended at Discharge Intermittent Supervision/Assistance  Patient can return home with the following  A little help with walking and/or transfers;A little help with bathing/dressing/bathroom;Help with stairs or ramp for entrance    Equipment Recommendations None recommended by PT  Recommendations for Other Services       Functional Status Assessment Patient has had a recent decline in their functional status and demonstrates the ability to make significant improvements in function in a reasonable and predictable amount of time.     Precautions / Restrictions Precautions Precautions: Fall Restrictions Weight Bearing Restrictions: No      Mobility  Bed Mobility Overal bed mobility: Needs Assistance Bed Mobility: Supine to Sit, Sit to Supine     Supine to sit: Min assist Sit to supine: Min assist        Transfers Overall transfer level: Needs assistance Equipment used: 1 person hand held assist Transfers: Sit to/from Stand Sit to Stand: Min guard           General transfer comment: did not attempt to walk; HR 155-160 on standing    Ambulation/Gait                  Stairs  Wheelchair Mobility    Modified Rankin (Stroke Patients Only)       Balance Overall balance assessment: Needs assistance Sitting-balance support: Single extremity supported, Feet supported Sitting balance-Leahy Scale: Good Sitting balance - Comments: good sitting balance with both feet on the floor; hands on mattress   Standing balance support: Single extremity supported, During functional activity Standing balance-Leahy Scale: Good Standing balance comment: no loss of balance or sway noted                              Pertinent Vitals/Pain Pain Assessment Pain Assessment: No/denies pain    Home Living Family/patient expects to be discharged to:: Private residence Living Arrangements: Spouse/significant other Available Help at Discharge: Family   Home Access: Ramped entrance       Home Layout: One level Home Equipment: None      Prior Function Prior Level of Function : Independent/Modified Independent (recently needed some A with bathing)                     Hand Dominance        Extremity/Trunk Assessment   Upper Extremity Assessment Upper Extremity Assessment: Generalized weakness    Lower Extremity Assessment Lower Extremity Assessment: Generalized weakness    Cervical / Trunk Assessment Cervical / Trunk Assessment: Normal  Communication   Communication: No difficulties  Cognition Arousal/Alertness: Awake/alert                                              General Comments      Exercises     Assessment/Plan    PT Assessment Patient needs continued PT services  PT Problem List Decreased strength;Decreased activity tolerance;Decreased mobility       PT Treatment Interventions Gait training;Functional mobility training;Therapeutic activities;Therapeutic exercise;Patient/family education    PT Goals (Current goals can be found in the Care Plan section)  Acute Rehab PT Goals Patient Stated Goal: return home PT Goal Formulation: With patient/family Time For Goal Achievement: 02/03/22 Potential to Achieve Goals: Good    Frequency Min 2X/week     Co-evaluation               AM-PAC PT "6 Clicks" Mobility  Outcome Measure Help needed turning from your back to your side while in a flat bed without using bedrails?: A Little Help needed moving from lying on your back to sitting on the side of a flat bed without using bedrails?: A Little Help needed moving to and from a bed to a chair (including a wheelchair)?:  A Little Help needed standing up from a chair using your arms (e.g., wheelchair or bedside chair)?: A Little Help needed to walk in hospital room?: A Little Help needed climbing 3-5 steps with a railing? : A Lot 6 Click Score: 17    End of Session   Activity Tolerance: Patient tolerated treatment well;Patient limited by fatigue Patient left: in bed;with nursing/sitter in room;with family/visitor present Nurse Communication: Mobility status PT Visit Diagnosis: Muscle weakness (generalized) (M62.81)    Time: 3235-5732 PT Time Calculation (min) (ACUTE ONLY): 18 min   Charges:   PT Evaluation $PT Eval Low Complexity: 1 Low          12:41 PM, 01/20/22 Linton Stolp Small Miro Balderson MPT [place] [facility] [place] [phone] [phone]

## 2022-01-21 ENCOUNTER — Other Ambulatory Visit: Payer: Self-pay

## 2022-01-21 ENCOUNTER — Inpatient Hospital Stay (HOSPITAL_COMMUNITY): Payer: Medicare Other

## 2022-01-21 DIAGNOSIS — E860 Dehydration: Secondary | ICD-10-CM | POA: Diagnosis not present

## 2022-01-21 DIAGNOSIS — I471 Supraventricular tachycardia, unspecified: Secondary | ICD-10-CM

## 2022-01-21 DIAGNOSIS — E43 Unspecified severe protein-calorie malnutrition: Secondary | ICD-10-CM | POA: Insufficient documentation

## 2022-01-21 DIAGNOSIS — R112 Nausea with vomiting, unspecified: Secondary | ICD-10-CM | POA: Diagnosis not present

## 2022-01-21 LAB — COMPREHENSIVE METABOLIC PANEL
ALT: 20 U/L (ref 0–44)
AST: 16 U/L (ref 15–41)
Albumin: 2.1 g/dL — ABNORMAL LOW (ref 3.5–5.0)
Alkaline Phosphatase: 82 U/L (ref 38–126)
Anion gap: 10 (ref 5–15)
BUN: 9 mg/dL (ref 8–23)
CO2: 25 mmol/L (ref 22–32)
Calcium: 7.7 mg/dL — ABNORMAL LOW (ref 8.9–10.3)
Chloride: 100 mmol/L (ref 98–111)
Creatinine, Ser: 0.6 mg/dL — ABNORMAL LOW (ref 0.61–1.24)
GFR, Estimated: >60 mL/min (ref >60)
Glucose, Bld: 88 mg/dL (ref 70–99)
Potassium: 2.9 mmol/L — ABNORMAL LOW (ref 3.5–5.1)
Sodium: 135 mmol/L (ref 135–145)
Total Bilirubin: 0.9 mg/dL (ref 0.3–1.2)
Total Protein: 4.3 g/dL — ABNORMAL LOW (ref 6.5–8.1)

## 2022-01-21 LAB — MAGNESIUM
Magnesium: 1.5 mg/dL — ABNORMAL LOW (ref 1.7–2.4)
Magnesium: 1.9 mg/dL (ref 1.7–2.4)

## 2022-01-21 LAB — BASIC METABOLIC PANEL
Anion gap: 8 (ref 5–15)
BUN: 10 mg/dL (ref 8–23)
CO2: 26 mmol/L (ref 22–32)
Calcium: 7.6 mg/dL — ABNORMAL LOW (ref 8.9–10.3)
Chloride: 98 mmol/L (ref 98–111)
Creatinine, Ser: 0.64 mg/dL (ref 0.61–1.24)
GFR, Estimated: >60 mL/min (ref >60)
Glucose, Bld: 101 mg/dL — ABNORMAL HIGH (ref 70–99)
Potassium: 3.4 mmol/L — ABNORMAL LOW (ref 3.5–5.1)
Sodium: 132 mmol/L — ABNORMAL LOW (ref 135–145)

## 2022-01-21 LAB — CBC
HCT: 25.8 % — ABNORMAL LOW (ref 39.0–52.0)
Hemoglobin: 8.3 g/dL — ABNORMAL LOW (ref 13.0–17.0)
MCH: 29 pg (ref 26.0–34.0)
MCHC: 32.2 g/dL (ref 30.0–36.0)
MCV: 90.2 fL (ref 80.0–100.0)
Platelets: 217 10*3/uL (ref 150–400)
RBC: 2.86 MIL/uL — ABNORMAL LOW (ref 4.22–5.81)
RDW: 16.2 % — ABNORMAL HIGH (ref 11.5–15.5)
WBC: 45.9 10*3/uL — ABNORMAL HIGH (ref 4.0–10.5)
nRBC: 0 % (ref 0.0–0.2)

## 2022-01-21 LAB — ECHOCARDIOGRAM LIMITED
Height: 73 in
S' Lateral: 3.1 cm
Weight: 2489.6 oz

## 2022-01-21 MED ORDER — CLONAZEPAM 0.25 MG PO TBDP
0.2500 mg | ORAL_TABLET | Freq: Two times a day (BID) | ORAL | Status: DC
  Administered 2022-01-21 – 2022-01-22 (×3): 0.25 mg via ORAL
  Filled 2022-01-21 (×3): qty 1

## 2022-01-21 MED ORDER — POTASSIUM CHLORIDE CRYS ER 20 MEQ PO TBCR
40.0000 meq | EXTENDED_RELEASE_TABLET | ORAL | Status: AC
  Administered 2022-01-21 (×2): 40 meq via ORAL
  Filled 2022-01-21 (×2): qty 2

## 2022-01-21 MED ORDER — MAGNESIUM SULFATE 2 GM/50ML IV SOLN
2.0000 g | Freq: Once | INTRAVENOUS | Status: AC
  Administered 2022-01-21: 2 g via INTRAVENOUS
  Filled 2022-01-21: qty 50

## 2022-01-21 MED ORDER — METOPROLOL TARTRATE 25 MG PO TABS
25.0000 mg | ORAL_TABLET | Freq: Two times a day (BID) | ORAL | Status: DC
  Administered 2022-01-21 – 2022-01-23 (×4): 25 mg via ORAL
  Filled 2022-01-21 (×4): qty 1

## 2022-01-21 MED ORDER — METOPROLOL TARTRATE 5 MG/5ML IV SOLN
2.5000 mg | Freq: Once | INTRAVENOUS | Status: AC
  Administered 2022-01-21: 2.5 mg via INTRAVENOUS
  Filled 2022-01-21: qty 5

## 2022-01-21 MED ORDER — METOPROLOL TARTRATE 25 MG PO TABS
12.5000 mg | ORAL_TABLET | Freq: Two times a day (BID) | ORAL | Status: DC
  Administered 2022-01-21: 12.5 mg via ORAL
  Filled 2022-01-21: qty 1

## 2022-01-21 MED ORDER — PROSOURCE PLUS PO LIQD
30.0000 mL | Freq: Two times a day (BID) | ORAL | Status: DC
  Administered 2022-01-21 – 2022-01-24 (×7): 30 mL via ORAL
  Filled 2022-01-21 (×8): qty 30

## 2022-01-21 MED ORDER — METOPROLOL TARTRATE 5 MG/5ML IV SOLN: 2.5000 mg | INTRAVENOUS | Status: DC | PRN

## 2022-01-21 NOTE — Progress Notes (Incomplete)
*  PRELIMINARY RESULTS* Echocardiogram Limited 2D Echocardiogram has been performed.  Elpidio Anis 01/21/2022, 3:10 PM

## 2022-01-21 NOTE — Hospital Course (Addendum)
PMH of CLL, COPD, squamous cell lung cancer with metastasis to bone, anxiety and depression presented to the hospital with complaints of intractable nausea and vomiting and poor p.o. intake with hypokalemia.  Patient has dysgeusia causing nausea.  Currently with minimal oral intake.

## 2022-01-21 NOTE — TOC Initial Note (Addendum)
Transition of Care [facility]) - Initial/Assessment Note    Patient Details  Name: Alfred Brown MRN: [id number] Date of Birth: May 19, 1950  Transition of [facility] CM/SW Contact:    Iona Beard, [place] Phone Number: 01/21/2022, 11:58 AM  Clinical Narrative:                 Pt is high risk for readmission. PT is recommending HH at D/C. CSW spoke with pts son who is in room with pt. He states pt lives with his wife. Pt is independent in completing his ADL's. Pt can drive but recently family has been providing transportation. Pts son requests that CSW speak with pts wife about HH being set up.   CSW spoke with pts wife who states they would like [place] set up at D/C. Pts [place] referral accepted by Vaughan Basta with [facility]. Pt does not use any DME. TOC to follow.   Expected Discharge Plan: [place] Barriers to Discharge: Continued Medical Work up   Patient Goals and CMS Choice Patient states their goals for this hospitalization and ongoing recovery are:: home with [facility] CMS Medicare.gov Compare Post Acute Care list provided to:: Patient Represenative (must comment) Choice offered to / list presented to : Patient, Spouse, Adult Children  Expected Discharge Plan and Services Expected Discharge Plan: [place] In-house Referral: Clinical Social Work Discharge Planning Services: CM Consult Post Acute Care Choice: [place] arrangements for the past 2 months: Single Family Home                                      Prior Living Arrangements/Services Living arrangements for the past 2 months: Single Family Home Lives with:: Spouse Patient language and need for interpreter reviewed:: Yes Do you feel safe going back to the place where you live?: Yes      Need for Family Participation in Patient Care: Yes (Comment) Care giver support system in place?: Yes (comment)   Criminal Activity/Legal Involvement Pertinent to Current  Situation/Hospitalization: No - Comment as needed  Activities of Daily Living Home Assistive Devices/Equipment: None ADL Screening (condition at time of admission) Patient's cognitive ability adequate to safely complete daily activities?: Yes Is the patient deaf or have difficulty hearing?: No Does the patient have difficulty seeing, even when wearing glasses/contacts?: No Does the patient have difficulty concentrating, remembering, or making decisions?: No Patient able to express need for assistance with ADLs?: Yes Does the patient have difficulty dressing or bathing?: Yes Independently performs ADLs?: Yes (appropriate for developmental age) Does the patient have difficulty walking or climbing stairs?: Yes Weakness of Legs: Both Weakness of Arms/Hands: Both  Permission Sought/Granted                  Emotional Assessment Appearance:: Appears stated age Attitude/Demeanor/Rapport: Engaged Affect (typically observed): Accepting Orientation: : Oriented to Self, Oriented to Place, Oriented to  Time, Oriented to Situation Alcohol / Substance Use: Not Applicable Psych Involvement: No (comment)  Admission diagnosis:  Dehydration [E86.0] Tachycardia [R00.0] CLL (chronic lymphocytic leukemia) (HCC) [C91.10] Intractable nausea and vomiting [R11.2] Patient Active Problem List   Diagnosis Date Noted   Hypokalemia 01/20/2022   Hypoalbuminemia due to protein-calorie malnutrition ([place]) 01/20/2022   Depression 01/20/2022   Failure to thrive in adult 01/20/2022   Unintentional weight loss 01/20/2022   Prolonged QT interval 01/20/2022   SVT (supraventricular tachycardia)  01/20/2022   Sinus tachycardia 01/20/2022   Squamous cell lung cancer, left ([place]) 01/07/2022   Intractable vomiting 12/19/2021   Intractable nausea and vomiting 12/18/2021   Leukocytosis 12/18/2021   Cancer associated pain 12/18/2021   Generalized anxiety disorder 12/18/2021   Rib pain on right side 11/22/2021    Mass of left lung 11/15/2021   Lipoma of anterior chest wall 09/20/2021   Peyronie's disease 11/24/2019   Hypogammaglobulinemia ([place]) 09/23/2019   COPD (chronic obstructive pulmonary disease) ([place]) 08/05/2019   Acute exacerbation of COPD with asthma ([place]) 06/03/2019   Acute and chronic respiratory failure with hypoxia ([place]) 06/03/2019   History of COVID-19 dx 05-09-19 06/03/2019   Pneumonia due to COVID-19 virus 06/02/2019   GERD (gastroesophageal reflux disease)    Hyperlipidemia    Palpitations    Chronic lymphocytic leukemia ([place])    Aortic valve disorder    Right bundle branch block    Tobacco abuse    PCP:  Redmond School, MD Pharmacy:   [place] [place] [address] [place] [address] Phone: [phone] Fax: [phone]     Social Determinants of Health (SDOH) Interventions    Readmission Risk Interventions    01/21/2022   11:57 AM  Readmission Risk Prevention Plan  Transportation Screening Complete  HRI or Home Care Consult Complete  Social Work Consult for [place] Planning/Counseling Complete  Palliative Care Screening Not Applicable  Medication Review Press photographer) Complete

## 2022-01-21 NOTE — Progress Notes (Signed)
Initial Nutrition Assessment  DOCUMENTATION CODES:   Severe malnutrition in context of chronic illness  INTERVENTION:  Ensure Enlive po BID   Magic cup TID with meals   Regular diet   NUTRITION DIAGNOSIS:   Severe Malnutrition related to catabolic illness, chronic illness (NSCLC with metastasis to bone) as evidenced by energy intake < or equal to 75% for > or equal to 1 month, percent weight loss (>7.5% x 3 months).   GOAL:  Patient will meet greater than or equal to 90% of their needs (if feasible given pt condition)   MONITOR:  PO intake, Supplement acceptance, Labs, Weight trends  REASON FOR ASSESSMENT:   Consult, Malnutrition Screening Tool Assessment of nutrition requirement/status  ASSESSMENT: Patient is a 71 yo male with hx of COPD, CLL, non-small cell lung cancer with metastasis to bone. Palliative radiation 10/4 per chart. 10/16 Port-A-Cath placed by IR. 10/16 Patient contacted by [place] 10/18 Chemotherapy Beryle Flock, taxol, and carbo D1C1) 10/20-Udenyca and 1 liter of Normal Saline  Patient taking medication for appetite. Patient family bedside. He has not eaten today. There is a [employer] on tray table which he "tasted" but has not consumed. Patient having difficulty hypersensitivity to smell of foods. He has put tissue in his nose to limit the overwhelming smell. We talked about favorite foods and discussed oral nutrition supplements. He also received education and a case of Ensure during visit with [place] RD 10/16 & 10/17 per chart.  Significant unplanned weight loss (11%, 8 kg) x 3 months.   Medications: Marinol, klor-con  IVF- Lactated Ringers @ 100 ml/hr     Latest Ref Rng & Units 01/21/2022    5:24 AM 01/20/2022    1:07 PM 01/20/2022    4:03 AM  BMP  Glucose 70 - 99 mg/dL 88  109    BUN 8 - 23 mg/dL 9  12    Creatinine 0.61 - 1.24 mg/dL 0.60  0.67  0.67   Sodium 135 - 145 mmol/L 135  136    Potassium 3.5 - 5.1 mmol/L 2.9   4.4    Chloride 98 - 111 mmol/L 100  104    CO2 22 - 32 mmol/L 25  25    Calcium 8.9 - 10.3 mg/dL 7.7  7.8        NUTRITION - FOCUSED PHYSICAL EXAM:  Flowsheet Row Most Recent Value  Orbital Region Mild depletion  Upper Arm Region Moderate depletion  Thoracic and Lumbar Region Moderate depletion  Buccal Region Mild depletion  Temple Region Moderate depletion  Clavicle Bone Region Mild depletion  Clavicle and Acromion Bone Region Moderate depletion  Dorsal Hand Mild depletion  Patellar Region Moderate depletion  Anterior Thigh Region Severe depletion  Edema (RD Assessment) None  Hair Reviewed  Eyes Reviewed  Mouth Reviewed  Skin Reviewed  Nails Reviewed       Diet Order:   Diet Order             Diet regular Room service appropriate? Yes; Fluid consistency: Thin  Diet effective now                   EDUCATION NEEDS:  Education needs have been addressed  Skin:  Skin Assessment: Reviewed RN Assessment  Last BM:  10/20  Height:   Ht Readings from Last 1 Encounters:  01/20/22 6\' 1"  (1.854 m)    Weight:   Wt Readings from Last 1 Encounters:  01/20/22 70.6 kg  Ideal Body Weight:   84 kg  BMI:  Body mass index is 20.53 kg/m.  Estimated Nutritional Needs:   Kcal:  2200-2400  Protein:  95-110 gr  Fluid:  >2 liters daily   Colman Cater MS,RD,CSG,LDN Contact:AMION

## 2022-01-21 NOTE — Progress Notes (Signed)
  Progress Note Patient: Alfred Brown:[id number] DOB: 08-Jan-1951 DOA: 01/19/2022  DOS: the patient was seen and examined on 01/21/2022  Brief hospital course: PMH of CLL, COPD, squamous cell lung cancer with metastasis to bone, anxiety and depression presented to the hospital with complaints of intractable nausea and vomiting and poor p.o. intake with hypokalemia.  Assessment and Plan: Intractable nausea and vomiting. Dysgeusia. Patient presents with complaints of intractable nausea and vomiting.  Tells me that any smell or any discussion of food causes him to have nausea therefore he is afraid to eat. He eats at home with his nose plugged with cotton balls. Has significant weight loss. Poor p.o. intake has been ongoing for last few months. Remeron initiated currently tolerating it well. Add scheduled Klonopin due to refractoriness of dysgeusia. Already on Flonase.  Hypokalemia.  Hypomagnesemia From poor p.o. intake. Currently replacing aggressively due to RVR.  SVT. Patient has frequent SVTs.  EKG on 10/23 made comment about possible A-fib although patient does have P wave therefore suspect this is also an SVT. Aggressively replacing electrolytes. Patient is actually on Lopressor at home. We will continue with holding parameters. Home dose should be 37.5 mg twice daily.  Prolonged QTc. Monitor.  Correct electrolytes.  Elevated troponin. Mostly secondary to demand ischemia. Monitor.  Severe protein calorie malnutrition.  Adult failure to thrive. Body mass index is 20.53 kg/m.  Dietitian consulted.  Continue supportive meds.  Squamous cell carcinoma.  With mets to ribs. CLL. Recent chemotherapy. Likely contributing to patient's nausea vomiting and dysgeusia as well as diarrhea. Monitor for now.  COPD. Currently no exacerbation. Monitor.  Anxiety and depression. Patient is on Xanax and Wellbutrin. Xanax changed to Klonopin.  Monitor for now.  Goals of  catheterization. Palliative care consult. Monitor.  Disorientation. On admission patient did have some confusion. Currently mentation better. Monitor blood  Subjective: Continues to have nausea no vomiting no fever no chills.  No chest pain.  No abdominal pain.  Physical Exam: Vitals:   01/21/22 0814 01/21/22 0850 01/21/22 1144 01/21/22 1537  BP: 105/63 106/73 103/60 121/61  Pulse: (!) 134 (!) 117 98 94  Resp: 20  20 18   Temp: 98.3 F (36.8 C)   98.1 F (36.7 C)  TempSrc: Oral   Oral  SpO2: 94%  93% 96%  Weight:      Height:       General: Appear in moderate distress; no visible Abnormal Neck Mass Or lumps, Conjunctiva normal Cardiovascular: S1 and S2 Present, no Murmur, Respiratory: good respiratory effort, Bilateral Air entry present and CTA, no Crackles, no wheezes Abdomen: Bowel Sound present, Non tender  Extremities: no Pedal edema Neurology: alert and oriented to time, place, and person  Gait not checked due to patient safety concerns   Data Reviewed: I have Reviewed nursing notes, Vitals, and Lab results since pt's last encounter. Pertinent lab results CBC and BMP I have ordered test including CBC and BMP    Family Communication: Wife at bedside  Disposition: Status is: Inpatient Remains inpatient appropriate because: Need for IV therapies.  Close observation.  enoxaparin (LOVENOX) injection 40 mg Start: 01/20/22 1000 SCDs Start: 01/20/22 0353   Level of care: Telemetry Telemetry reviewed, shows MAT and Continue Telemetry due to tachycardia   Author: Berle Mull, MD 01/21/2022 5:55 PM Please look on [email or web address] to find out who is on call.

## 2022-01-21 NOTE — Progress Notes (Signed)
Patient admitted to rm 321 with spouse at bedside. A & O x 3, he ambulated one assist from wheelchair to bed and from bed to bathroom and back to bed with minimal difficulty. LR IV fluids running continuously at 100 ml/hr. Patient c/o nausea and pain; RN administered PRNs for symptoms. Patient did not call out for anything the rest of shift.

## 2022-01-21 NOTE — Progress Notes (Signed)
   01/21/22 0814  Assess: MEWS Score  Temp 98.3 F (36.8 C)  BP 105/63  Pulse Rate (!) 134  Resp 20  Level of Consciousness Alert  SpO2 94 %  O2 Device Room Air  Patient Activity (if Appropriate) In bed  Assess: MEWS Score  MEWS Temp 0  MEWS Systolic 0  MEWS Pulse 3  MEWS RR 0  MEWS LOC 0  MEWS Score 3  MEWS Score Color Yellow  Assess: if the MEWS score is Yellow or Red  Were vital signs taken at a resting state? Yes  Focused Assessment Change from prior assessment (see assessment flowsheet)  Does the patient meet 2 or more of the SIRS criteria? Yes  Does the patient have a confirmed or suspected source of infection? No  MEWS guidelines implemented *See Row Information* Yes  Treat  MEWS Interventions Escalated (See documentation below)  Pain Scale 0-10  Pain Score 0  Take Vital Signs  Increase Vital Sign Frequency  Yellow: Q 2hr X 2 then Q 4hr X 2, if remains yellow, continue Q 4hrs  Escalate  MEWS: Escalate Yellow: discuss with charge nurse/RN and consider discussing with provider and RRT  Notify: Charge Nurse/RN  Name of Charge Nurse/RN Notified Mable Fill, RN  Date Charge Nurse/RN Notified 01/21/22  Time Charge Nurse/RN Notified 0818  Notify: Provider  Provider Name/Title dr patel  Date Provider Notified 01/21/22  Time Provider Notified [phone]  Method of Notification Page  Notification Reason Other (Comment) (HR sustaining 130s-140s)  Provider response  (awaiting response)  Document  Progress note created (see row info) Yes  Assess: SIRS CRITERIA  SIRS Temperature  0  SIRS Pulse 1  SIRS Respirations  0  SIRS WBC 1  SIRS Score Sum  2

## 2022-01-21 NOTE — Progress Notes (Signed)
   01/21/22 0814  Assess: MEWS Score  Temp 98.3 F (36.8 C)  BP 105/63  Pulse Rate (!) 134  Resp 20  Level of Consciousness Alert  SpO2 94 %  O2 Device Room Air  Patient Activity (if Appropriate) In bed  Assess: MEWS Score  MEWS Temp 0  MEWS Systolic 0  MEWS Pulse 3  MEWS RR 0  MEWS LOC 0  MEWS Score 3  MEWS Score Color Yellow  Assess: if the MEWS score is Yellow or Red  Were vital signs taken at a resting state? Yes  Focused Assessment Change from prior assessment (see assessment flowsheet)  Does the patient meet 2 or more of the SIRS criteria? Yes  Does the patient have a confirmed or suspected source of infection? No  MEWS guidelines implemented *See Row Information* Yes  Treat  MEWS Interventions Escalated (See documentation below);Other (Comment) (EKG per orders)  Pain Scale 0-10  Pain Score 0  Take Vital Signs  Increase Vital Sign Frequency  Yellow: Q 2hr X 2 then Q 4hr X 2, if remains yellow, continue Q 4hrs  Escalate  MEWS: Escalate Yellow: discuss with charge nurse/RN and consider discussing with provider and RRT  Notify: Charge Nurse/RN  Name of Charge Nurse/RN Notified Mable Fill, RN  Date Charge Nurse/RN Notified 01/21/22  Time Charge Nurse/RN Notified 0818  Notify: Provider  Provider Name/Title dr patel  Date Provider Notified 01/21/22  Time Provider Notified [phone]  Method of Notification Page  Notification Reason Other (Comment) (HR sustaining 130s-140s)  Provider response See new orders (awaiting response - EKG order placed, notified dr patel of results - afib RVR)  Date of Provider Response 01/21/22  Time of Provider Response 0820  Document  Progress note created (see row info) Yes  Assess: SIRS CRITERIA  SIRS Temperature  0  SIRS Pulse 1  SIRS Respirations  0  SIRS WBC 1  SIRS Score Sum  2

## 2022-01-22 ENCOUNTER — Ambulatory Visit: Payer: Medicare Other | Admitting: Internal Medicine

## 2022-01-22 ENCOUNTER — Inpatient Hospital Stay (HOSPITAL_COMMUNITY): Payer: Medicare Other

## 2022-01-22 DIAGNOSIS — R112 Nausea with vomiting, unspecified: Secondary | ICD-10-CM | POA: Diagnosis not present

## 2022-01-22 LAB — CBC WITH DIFFERENTIAL/PLATELET
Abs Immature Granulocytes: 2.1 10*3/uL — ABNORMAL HIGH (ref 0.00–0.07)
Basophils Absolute: 0 10*3/uL (ref 0.0–0.1)
Basophils Relative: 0 %
Eosinophils Absolute: 0 10*3/uL (ref 0.0–0.5)
Eosinophils Relative: 0 %
HCT: 26.1 % — ABNORMAL LOW (ref 39.0–52.0)
Hemoglobin: 8.4 g/dL — ABNORMAL LOW (ref 13.0–17.0)
Lymphocytes Relative: 40 %
Lymphs Abs: 16.4 10*3/uL — ABNORMAL HIGH (ref 0.7–4.0)
MCH: 29.2 pg (ref 26.0–34.0)
MCHC: 32.2 g/dL (ref 30.0–36.0)
MCV: 90.6 fL (ref 80.0–100.0)
Metamyelocytes Relative: 3 %
Monocytes Absolute: 0.8 10*3/uL (ref 0.1–1.0)
Monocytes Relative: 2 %
Myelocytes: 2 %
Neutro Abs: 21.7 10*3/uL — ABNORMAL HIGH (ref 1.7–7.7)
Neutrophils Relative %: 53 %
Platelets: 214 10*3/uL (ref 150–400)
RBC: 2.88 MIL/uL — ABNORMAL LOW (ref 4.22–5.81)
RDW: 16.2 % — ABNORMAL HIGH (ref 11.5–15.5)
WBC: 41 10*3/uL — ABNORMAL HIGH (ref 4.0–10.5)
nRBC: 0 % (ref 0.0–0.2)

## 2022-01-22 LAB — BASIC METABOLIC PANEL
Anion gap: 6 (ref 5–15)
BUN: 11 mg/dL (ref 8–23)
CO2: 23 mmol/L (ref 22–32)
Calcium: 7.7 mg/dL — ABNORMAL LOW (ref 8.9–10.3)
Chloride: 106 mmol/L (ref 98–111)
Creatinine, Ser: 0.68 mg/dL (ref 0.61–1.24)
GFR, Estimated: >60 mL/min (ref >60)
Glucose, Bld: 94 mg/dL (ref 70–99)
Potassium: 3.7 mmol/L (ref 3.5–5.1)
Sodium: 135 mmol/L (ref 135–145)

## 2022-01-22 LAB — MAGNESIUM: Magnesium: 1.6 mg/dL — ABNORMAL LOW (ref 1.7–2.4)

## 2022-01-22 MED ORDER — LOPERAMIDE HCL 2 MG PO CAPS
4.0000 mg | ORAL_CAPSULE | Freq: Once | ORAL | Status: AC
  Administered 2022-01-22: 4 mg via ORAL
  Filled 2022-01-22: qty 2

## 2022-01-22 MED ORDER — GUAIFENESIN 100 MG/5ML PO LIQD
5.0000 mL | ORAL | Status: DC | PRN
  Administered 2022-01-22 – 2022-01-24 (×4): 5 mL via ORAL
  Filled 2022-01-22 (×4): qty 5

## 2022-01-22 MED ORDER — SACCHAROMYCES BOULARDII 250 MG PO CAPS
250.0000 mg | ORAL_CAPSULE | Freq: Two times a day (BID) | ORAL | Status: DC
  Administered 2022-01-22 – 2022-01-24 (×4): 250 mg via ORAL
  Filled 2022-01-22 (×4): qty 1

## 2022-01-22 MED ORDER — BENZONATATE 100 MG PO CAPS
100.0000 mg | ORAL_CAPSULE | Freq: Three times a day (TID) | ORAL | Status: DC
  Administered 2022-01-22 – 2022-01-24 (×7): 100 mg via ORAL
  Filled 2022-01-22 (×7): qty 1

## 2022-01-22 MED ORDER — DEXTROMETHORPHAN POLISTIREX ER 30 MG/5ML PO SUER
30.0000 mg | Freq: Two times a day (BID) | ORAL | Status: DC
  Administered 2022-01-22 – 2022-01-24 (×5): 30 mg via ORAL
  Filled 2022-01-22 (×5): qty 5

## 2022-01-22 MED ORDER — CLONAZEPAM 0.5 MG PO TABS
0.5000 mg | ORAL_TABLET | Freq: Every day | ORAL | Status: DC
  Administered 2022-01-22: 0.5 mg via ORAL
  Filled 2022-01-22: qty 1

## 2022-01-22 MED ORDER — CLONAZEPAM 0.25 MG PO TBDP
0.2500 mg | ORAL_TABLET | Freq: Every day | ORAL | Status: DC
  Administered 2022-01-23: 0.25 mg via ORAL
  Filled 2022-01-22: qty 1

## 2022-01-22 MED ORDER — DRONABINOL 5 MG PO CAPS
5.0000 mg | ORAL_CAPSULE | Freq: Two times a day (BID) | ORAL | Status: DC
  Administered 2022-01-23 – 2022-01-24 (×4): 5 mg via ORAL
  Filled 2022-01-22 (×4): qty 1

## 2022-01-22 NOTE — TOC Transition Note (Signed)
Transition of Care [facility]) - CM/SW Discharge Note   Patient Details  Name: Alfred Brown MRN: [id number] Date of Birth: [birth year]  Transition of Care [facility]) CM/SW Contact:  Iona Beard, [place] Phone Number: 01/22/2022, 12:32 PM   Clinical Narrative:    CSW updated of plan for D/C home today. CSW updated Vaughan Basta with [facility] or plan for D/C. CSW requested MD place [facility] PT/RN orders. TOC signing off.   Final next level of care: [place] Barriers to Discharge: No Barriers Identified   Patient Goals and CMS Choice Patient states their goals for this hospitalization and ongoing recovery are:: home with [facility] CMS Medicare.gov Compare Post Acute Care list provided to:: Patient Represenative (must comment) Choice offered to / list presented to : Patient, Spouse, Adult Children  Discharge Placement                       Discharge Plan and Services In-house Referral: Clinical Social Work Discharge Planning Services: CM Consult Post Acute Care Choice: Home Health                               Social Determinants of Health (SDOH) Interventions     Readmission Risk Interventions    01/21/2022   11:57 AM  Readmission Risk Prevention Plan  Transportation Screening Complete  HRI or Home Care Consult Complete  Social Work Consult for [place] Planning/Counseling Complete  Palliative Care Screening Not Applicable  Medication Review Press photographer) Complete

## 2022-01-22 NOTE — Progress Notes (Signed)
Pt's nausea was controlled for most of the night. He had approximately 3 watery stools throughout night in which he requested imodium for. This RN reached out to over night coverage Dr. Josephine Cables who ordered a one time dose of imodium. Post imodium admin, patient became nauseated. PRN compazine administered and patient experienced some relief. VS stable throughout night and patient remained in NSR on cardiac monitor. Wife remains at bedside and is very supportive of patient's needs. Bryson Corona Edd Fabian

## 2022-01-22 NOTE — Care Management Important Message (Signed)
Important Message  Patient Details  Name: Alfred Brown MRN: [id number] Date of Birth: 29-Oct-1950   Medicare Important Message Given:  N/A - LOS <3 / Initial given by admissions     Tommy Medal 01/22/2022, 12:27 PM

## 2022-01-22 NOTE — Progress Notes (Signed)
Progress Note Patient: Alfred Brown LDJ:[id number] DOB: 1950/07/26 DOA: 01/19/2022  DOS: the patient was seen and examined on 01/22/2022  Brief hospital course: PMH of CLL, COPD, squamous cell lung cancer with metastasis to bone, anxiety and depression presented to the hospital with complaints of intractable nausea and vomiting and poor p.o. intake with hypokalemia.  Patient has dysgeusia causing nausea.  Currently with minimal oral intake. Assessment and Plan: Intractable nausea and vomiting. Dysgeusia. Patient presents with complaints of intractable nausea and vomiting.  Tells me that any smell or any discussion of certain food items causes him to have nausea therefore he is afraid to eat. He eats at home with his nose plugged with cotton balls. Has significant weight loss. Poor p.o. intake has been ongoing for last few months. Marinol initiated.  Currently tolerating.  Will increase the dose.   Add scheduled Klonopin due to refractoriness of dysgeusia. Already on Flonase.   Hypokalemia.  Hypomagnesemia From poor p.o. intake. Currently replacing aggressively due to RVR.   SVT. Patient has frequent SVTs.  EKG on 10/23 made comment about possible A-fib although patient does have P wave therefore suspect this is also an SVT. Aggressively replacing electrolytes. Patient is actually on Lopressor at home. We will continue with holding parameters. Home dose should be 37.5 mg twice daily.   Prolonged QTc. Monitor.  Correct electrolytes.   Elevated troponin. Mostly secondary to demand ischemia. Echocardiogram this admission shows preserved EF no wall motion abnormality.  No valvular abnormality.   Severe protein calorie malnutrition.  Adult failure to thrive. Body mass index is 20.53 kg/m.  Dietitian consulted.  Continue supportive meds.   Squamous cell carcinoma.  With mets to ribs. CLL. Recent chemotherapy. Likely contributing to patient's nausea vomiting and dysgeusia as well  as diarrhea. Monitor for now. Initiate Florastor for diarrhea.   COPD. Currently no exacerbation. Monitor.   Anxiety and depression. Patient is on Xanax and Wellbutrin. Xanax changed to Klonopin.  Monitor for now.   Goals of care discussion. Palliative care consult. Monitor.   Disorientation. On admission patient did have some confusion. Currently mentation better. Monitor blood  Dry cough. Etiology not clear. Examination unremarkable chest x-ray unremarkable. For now we will monitor. Continue aggressive cough suppression.   Subjective: Had 3 episodes of loose watery bowel movement last night.  No abdominal pain.  Has no new cough.  No fever no chills.  Continues to have minimal oral intake.  Physical Exam: Vitals:   01/21/22 1537 01/21/22 2029 01/22/22 0422 01/22/22 1319  BP: 121/61 108/62 120/82 (!) 109/59  Pulse: 94 99 94 86  Resp: 18 20 16 18   Temp: 98.1 F (36.7 C) (!) 97.1 F (36.2 C) 98.4 F (36.9 C) 98.2 F (36.8 C)  TempSrc: Oral  Axillary Oral  SpO2: 96% 93% 95% 96%  Weight:      Height:       General: Appear in moderate distress; no visible Abnormal Neck Mass Or lumps, Conjunctiva normal Cardiovascular: S1 and S2 Present, no Murmur, Respiratory: good respiratory effort, Bilateral Air entry present and CTA, no Crackles, no wheezes Abdomen: Bowel Sound present, Non tender  Extremities: no Pedal edema Neurology: alert and oriented to time, place, and person  Gait not checked due to patient safety concerns   Data Reviewed: I have Reviewed nursing notes, Vitals, and Lab results since pt's last encounter. Pertinent lab results CBC and BMP I have ordered test including CBC and BMP I have ordered imaging studies chest x-ray.  Family Communication: Discussed with sister and wife  Disposition: Status is: Inpatient Remains inpatient appropriate because: Poor p.o. intake.  Need close observation and frequent labs.  enoxaparin (LOVENOX) injection 40 mg  Start: 01/20/22 1000 SCDs Start: 01/20/22 0353   Level of care: Telemetry Continue telemetry due to tachycardia.  Author: Berle Mull, MD 01/22/2022 6:39 PM Please look on [email or web address] to find out who is on call.

## 2022-01-23 ENCOUNTER — Other Ambulatory Visit: Payer: Self-pay

## 2022-01-23 DIAGNOSIS — C9110 Chronic lymphocytic leukemia of B-cell type not having achieved remission: Secondary | ICD-10-CM | POA: Diagnosis not present

## 2022-01-23 DIAGNOSIS — C3492 Malignant neoplasm of unspecified part of left bronchus or lung: Secondary | ICD-10-CM

## 2022-01-23 DIAGNOSIS — R112 Nausea with vomiting, unspecified: Secondary | ICD-10-CM | POA: Diagnosis not present

## 2022-01-23 DIAGNOSIS — R627 Adult failure to thrive: Secondary | ICD-10-CM | POA: Diagnosis not present

## 2022-01-23 LAB — BASIC METABOLIC PANEL
Anion gap: 6 (ref 5–15)
BUN: 9 mg/dL (ref 8–23)
CO2: 23 mmol/L (ref 22–32)
Calcium: 7.7 mg/dL — ABNORMAL LOW (ref 8.9–10.3)
Chloride: 106 mmol/L (ref 98–111)
Creatinine, Ser: 0.68 mg/dL (ref 0.61–1.24)
GFR, Estimated: >60 mL/min (ref >60)
Glucose, Bld: 94 mg/dL (ref 70–99)
Potassium: 3.2 mmol/L — ABNORMAL LOW (ref 3.5–5.1)
Sodium: 135 mmol/L (ref 135–145)

## 2022-01-23 LAB — CBC WITH DIFFERENTIAL/PLATELET
Abs Immature Granulocytes: 1.3 10*3/uL — ABNORMAL HIGH (ref 0.00–0.07)
Band Neutrophils: 1 %
Basophils Absolute: 0 10*3/uL (ref 0.0–0.1)
Basophils Relative: 0 %
Eosinophils Absolute: 0 10*3/uL (ref 0.0–0.5)
Eosinophils Relative: 0 %
HCT: 24.4 % — ABNORMAL LOW (ref 39.0–52.0)
Hemoglobin: 7.7 g/dL — ABNORMAL LOW (ref 13.0–17.0)
Lymphocytes Relative: 66 %
Lymphs Abs: 28.6 10*3/uL — ABNORMAL HIGH (ref 0.7–4.0)
MCH: 28.9 pg (ref 26.0–34.0)
MCHC: 31.6 g/dL (ref 30.0–36.0)
MCV: 91.7 fL (ref 80.0–100.0)
Metamyelocytes Relative: 2 %
Monocytes Absolute: 2.2 10*3/uL — ABNORMAL HIGH (ref 0.1–1.0)
Monocytes Relative: 5 %
Myelocytes: 1 %
Neutro Abs: 11.3 10*3/uL — ABNORMAL HIGH (ref 1.7–7.7)
Neutrophils Relative %: 25 %
Platelets: 203 10*3/uL (ref 150–400)
RBC: 2.66 MIL/uL — ABNORMAL LOW (ref 4.22–5.81)
RDW: 16.7 % — ABNORMAL HIGH (ref 11.5–15.5)
WBC: 43.3 10*3/uL — ABNORMAL HIGH (ref 4.0–10.5)
nRBC: 0 % (ref 0.0–0.2)

## 2022-01-23 LAB — MAGNESIUM: Magnesium: 1.4 mg/dL — ABNORMAL LOW (ref 1.7–2.4)

## 2022-01-23 MED ORDER — CLONAZEPAM 0.25 MG PO TBDP: 0.2500 mg | ORAL_TABLET | Freq: Every day | ORAL | Status: DC

## 2022-01-23 MED ORDER — METOPROLOL TARTRATE 25 MG PO TABS
12.5000 mg | ORAL_TABLET | Freq: Two times a day (BID) | ORAL | Status: DC
  Administered 2022-01-23: 12.5 mg via ORAL
  Filled 2022-01-23 (×2): qty 1

## 2022-01-23 MED ORDER — POTASSIUM CHLORIDE 20 MEQ PO PACK
40.0000 meq | PACK | Freq: Once | ORAL | Status: AC
  Administered 2022-01-23: 40 meq via ORAL
  Filled 2022-01-23: qty 2

## 2022-01-23 MED ORDER — DEXAMETHASONE 4 MG PO TABS
2.0000 mg | ORAL_TABLET | Freq: Two times a day (BID) | ORAL | Status: DC
  Administered 2022-01-23 – 2022-01-24 (×2): 2 mg via ORAL
  Filled 2022-01-23 (×2): qty 1

## 2022-01-23 MED ORDER — LACTATED RINGERS IV SOLN: INTRAVENOUS | Status: DC

## 2022-01-23 MED ORDER — MAGNESIUM SULFATE 2 GM/50ML IV SOLN
2.0000 g | Freq: Once | INTRAVENOUS | Status: AC
  Administered 2022-01-23: 2 g via INTRAVENOUS
  Filled 2022-01-23: qty 50

## 2022-01-23 MED ORDER — LOPERAMIDE HCL 2 MG PO CAPS
2.0000 mg | ORAL_CAPSULE | Freq: Four times a day (QID) | ORAL | Status: DC | PRN
  Administered 2022-01-23: 2 mg via ORAL
  Filled 2022-01-23: qty 1

## 2022-01-23 MED ORDER — GUAIFENESIN ER 600 MG PO TB12
600.0000 mg | ORAL_TABLET | Freq: Two times a day (BID) | ORAL | Status: DC
  Administered 2022-01-23 – 2022-01-24 (×3): 600 mg via ORAL
  Filled 2022-01-23 (×3): qty 1

## 2022-01-23 MED ORDER — PROCHLORPERAZINE EDISYLATE 10 MG/2ML IJ SOLN
5.0000 mg | Freq: Four times a day (QID) | INTRAMUSCULAR | Status: DC
  Administered 2022-01-23 – 2022-01-24 (×4): 5 mg via INTRAVENOUS
  Filled 2022-01-23 (×4): qty 2

## 2022-01-23 MED ORDER — LORAZEPAM 0.5 MG PO TABS
0.5000 mg | ORAL_TABLET | Freq: Two times a day (BID) | ORAL | Status: DC
  Administered 2022-01-23: 0.5 mg via ORAL
  Filled 2022-01-23: qty 1

## 2022-01-23 NOTE — Progress Notes (Signed)
Wife said patient had appt tomorrow with Dr. Worthy Keeler.  Spoke with his office and they said it was for symptom management and since he was inpatient they cancelled  appt but if hospitalist put in a consult Dr. Raliegh Ip would come see patient in room.  Dr. Erlinda Hong put in consult.  Immodium has stopped BM's.  Palliative will see patient tomorrow.

## 2022-01-23 NOTE — Progress Notes (Signed)
Has had three loose stools today and received order for immodium.  Ambulating to bathroom with standby and wife assisting with IV pole.  No appetite but has not vomited.  Able to tolerate po medications.  Wife has been at bedside all day.

## 2022-01-23 NOTE — Plan of Care (Signed)
  Problem: Health Behavior/Discharge Planning: Goal: Ability to manage health-related needs will improve Outcome: Not Progressing   Problem: Nutrition: Goal: Adequate nutrition will be maintained Outcome: Not Progressing   Problem: Safety: Goal: Ability to remain free from injury will improve Outcome: Not Progressing

## 2022-01-23 NOTE — Progress Notes (Signed)
Physical Therapy Treatment Patient Details Name: Brayn J Chiaramonte MRN: [id number] DOB: 06/09/1950 Today's Date: 01/23/2022   History of Present Illness Devonte J Houlton is a 71 y.o. male with medical history significant of CLL (diagnosed in 2010, now on surveillance), mild COPD, anxiety/depression and left upper lobe squamous cell lung cancer with metastasis to bone who presents to the emergency department accompanied by wife due to 3-day onset of generalized weakness and nausea.  Patient had first chemotherapy for the lung cancer on Wednesday (10/18) .  On the following day (Thursday -10/19), patient had 2 episodes of watery stool, he took Imodium and symptoms resolved by next morning.  On Friday (10/20), he was nauseous and had an episode of nonbloody vomiting. 1 L of IV NS was given at the cancer center   He continued to take his nausea medication, but continues to be nauseous and had no taste for food, he said that food smells bad, he has also had decreased fluid intake.  Patient was noted to be disoriented today and patient was witnessed taking a razor to brush his teeth.  This led to the wife decided to take the patient to the ED for further evaluation and management.    PT Comments    Patient demonstrates good return for getting into/out of bed and ambulation in room and hallway without loss of balance without need for an AD.  Patient encouraged to use mask when walking in hallway and ambulate in room ad lib and with nursing staff/family supervising in hallways.  Plan:  Patient discharged from physical therapy to care of nursing for ambulation daily as tolerated for length of stay.     Recommendations for follow up therapy are one component of a multi-disciplinary discharge planning process, led by the attending physician.  Recommendations may be updated based on patient status, additional functional criteria and insurance authorization.  Follow Up Recommendations  Home health PT     Assistance  Recommended at Discharge PRN  Patient can return home with the following A little help with walking and/or transfers;A little help with bathing/dressing/bathroom;Help with stairs or ramp for entrance;Assistance with cooking/housework   Equipment Recommendations  None recommended by PT    Recommendations for Other Services       Precautions / Restrictions Precautions Precautions: None Restrictions Weight Bearing Restrictions: No     Mobility  Bed Mobility Overal bed mobility: Modified Independent                  Transfers Overall transfer level: Modified independent                      Ambulation/Gait Ambulation/Gait assistance: Modified independent (Device/Increase time) Gait Distance (Feet): 150 Feet Assistive device: None Gait Pattern/deviations: WFL(Within Functional Limits) Gait velocity: slightly decreased     General Gait Details: grossly WFL with good return for ambulation in room and hallway without loss of balance   Stairs             Wheelchair Mobility    Modified Rankin (Stroke Patients Only)       Balance Overall balance assessment: Mild deficits observed, not formally tested, Needs assistance Sitting-balance support: Feet supported, No upper extremity supported Sitting balance-Leahy Scale: Good Sitting balance - Comments: seated at EOB   Standing balance support: During functional activity, No upper extremity supported Standing balance-Leahy Scale: Good Standing balance comment: without AD                              Cognition Arousal/Alertness: Awake/alert Behavior During Therapy: WFL for tasks assessed/performed Overall Cognitive Status: Within Functional Limits for tasks assessed                                          Exercises      General Comments        Pertinent Vitals/Pain Pain Assessment Pain Assessment: No/denies pain    Home Living                           Prior Function            PT Goals (current goals can now be found in the care plan section) Acute Rehab PT Goals Patient Stated Goal: return home PT Goal Formulation: With patient Time For Goal Achievement: 01/23/22 Potential to Achieve Goals: Good Progress towards PT goals: Goals met/education completed, patient discharged from PT    Frequency           PT Plan Current plan remains appropriate;Other (comment) (Patient discharged to nursing staff for ambulation as tolerated)    Co-evaluation              AM-PAC PT "6 Clicks" Mobility   Outcome Measure  Help needed turning from your back to your side while in a flat bed without using bedrails?: None Help needed moving from lying on your back to sitting on the side of a flat bed without using bedrails?: None Help needed moving to and from a bed to a chair (including a wheelchair)?: None Help needed standing up from a chair using your arms (e.g., wheelchair or bedside chair)?: None Help needed to walk in hospital room?: A Little Help needed climbing 3-5 steps with a railing? : A Little 6 Click Score: 22    End of Session   Activity Tolerance: Patient tolerated treatment well Patient left: in chair;with call bell/phone within reach;with family/visitor present Nurse Communication: Mobility status PT Visit Diagnosis: Unsteadiness on feet (R26.81);Other abnormalities of gait and mobility (R26.89);Muscle weakness (generalized) (M62.81)     Time: 6384-5364 PT Time Calculation (min) (ACUTE ONLY): 14 min  Charges:  $Therapeutic Activity: 8-22 mins                     3:28 PM, 01/23/22 Lonell Grandchild, MPT Physical Therapist with [facility] [phone] office [phone] mobile phone

## 2022-01-23 NOTE — Consult Note (Signed)
[facility] Consultation Oncology  Name: BRIGHAM COBBINS      MRN: [id number]    Location: A321/A321-01  Date: 01/23/2022 Time:4:19 PM   REFERRING PHYSICIAN: Dr. Erlinda Hong  REASON FOR CONSULT: Intractable nausea and vomiting   DIAGNOSIS: Metastatic squamous cell lung cancer  HISTORY OF PRESENT ILLNESS: Mr. Deady is a 71 year old very pleasant male seen in consultation today at the request of Dr.Xu for further management of chemotherapy related complications.  This patient has received first cycle of chemotherapy with carboplatin, paclitaxel and pembrolizumab on 01/16/2022.  He came to the ER on 01/20/2022 with nausea.  1 episode of vomiting on 01/18/2022 and he received 1 L of normal saline.  He felt nauseous despite taking his Compazine at home.  He was also treated for SVT as inpatient.  He also had some watery stools while in the hospital which was controlled with Imodium.  He is receiving Compazine 10 mg IV every 6 hours as needed in the hospital.  He is also on Marinol 5 mg twice daily.  His wife is at bedside and reports that smell of food makes him not eat it.  He was able to eat beans yesterday and had Carnation instant breakfast this morning without throwing up.  PAST MEDICAL HISTORY:   Past Medical History:  Diagnosis Date   Aortic valve disorder    Mild insufficiency   Chronic lymphocytic leukemia ([place])    01/2012: WBC-90.2, H&H-15.1/45.3, platelets-185 03/31/12: Verified with Dr. Tressie Stalker that no precautions or modification of medical regime are required prior to orthopaedic surgery.    CLL (chronic lymphocytic leukemia) (HCC)    CMV (cytomegalovirus) (HCC)    COPD (chronic obstructive pulmonary disease) (HCC)    Depression with anxiety    DJD (degenerative joint disease)    GERD (gastroesophageal reflux disease)    Hyperlipidemia    Hypogammaglobulinemia ([place]) 09/23/2019   Lipoma    left chest wall   Palpitations 2004   PVCs; borderline stress nuclear in 2004, negative in  2007; Echo 2007; AV-sclerotic, very mild AI   Pneumonia    Right bundle branch block    + left posterior fascicular block   Tobacco abuse    80 pack years    ALLERGIES: No Known Allergies    MEDICATIONS: I have reviewed the patient's current medications.     PAST SURGICAL HISTORY Past Surgical History:  Procedure Laterality Date   COLONOSCOPY  01/31/2012   Negative screening study   GANGLION CYST EXCISION  04/02/1995   left wrist   IR IMAGING GUIDED PORT INSERTION  01/14/2022   LIPOMA EXCISION Left 10/11/2021   chest wall   ROTATOR CUFF REPAIR  04/02/1995   right   SHOULDER ARTHROSCOPY WITH SUBACROMIAL DECOMPRESSION  04/09/2012   Procedure: SHOULDER ARTHROSCOPY WITH SUBACROMIAL DECOMPRESSION;  Surgeon: Ninetta Lights, MD;  Location: [facility];  Service: Orthopedics;  Laterality: Left;  LEFT SHOULDER ARTHROSCOPY, SUBACROMIAL DECOMPRESSION, PARTIAL ACROMIOPLASTY WITH CORACOACROMIAL RELEASE, DISTAL CLAVICULECTOMY WITH ROTATOR CUFF REPAIR, DEBRIDEMENT OF LABRUM    FAMILY HISTORY: Family History  Problem Relation Age of Onset   Stroke Mother    Heart attack Father    Cancer Sister        colon   Colon cancer Sister    Cancer Brother        2 brothers died with lung cancer    SOCIAL HISTORY:  reports that he has quit smoking. His smoking use included cigarettes. He started smoking about 58 years  ago. He has a 45.00 pack-year smoking history. He has never used smokeless tobacco. He reports that he does not drink alcohol and does not use drugs.  PERFORMANCE STATUS: The patient's performance status is 2 - Symptomatic, <50% confined to bed  PHYSICAL EXAM: Most Recent Vital Signs: Blood pressure (!) 101/56, pulse 84, temperature 98.8 F (37.1 C), temperature source Oral, resp. rate 17, height _0  (1.854 m), weight 155 lb 9.6 oz (70.6 kg), SpO2 97 %. BP (!) 101/56 (BP Location: Right Arm)   Pulse 84   Temp 98.8 F (37.1 C) (Oral)   Resp 17   Ht _1   (1.854 m)   Wt 155 lb 9.6 oz (70.6 kg)   SpO2 97%   BMI 20.53 kg/m  General appearance: alert, cooperative, and appears stated age Lungs: clear to auscultation bilaterally Heart: regular rate and rhythm Abdomen:  Soft, nontender. Extremities:  1+ edema bilaterally. Neurologic: Grossly normal  LABORATORY DATA:  Results for orders placed or performed during the hospital encounter of 01/19/22 (from the past 48 hour(s))  Basic metabolic panel     Status: Abnormal   Collection Time: 01/22/22  5:24 AM  Result Value Ref Range   Sodium 135 135 - 145 mmol/L   Potassium 3.7 3.5 - 5.1 mmol/L   Chloride 106 98 - 111 mmol/L   CO2 23 22 - 32 mmol/L   Glucose, Bld 94 70 - 99 mg/dL    Comment: Glucose reference range applies only to samples taken after fasting for at least 8 hours.   BUN 11 8 - 23 mg/dL   Creatinine, Ser 0.68 0.61 - 1.24 mg/dL   Calcium 7.7 (L) 8.9 - 10.3 mg/dL   GFR, Estimated >60 >60 mL/min    Comment: (NOTE) Calculated using the CKD-EPI Creatinine Equation (2021)    Anion gap 6 5 - 15    Comment: Performed at [facility], [address] [place], [place] [address]  CBC with Differential/Platelet     Status: Abnormal   Collection Time: 01/22/22  5:24 AM  Result Value Ref Range   WBC 41.0 (H) 4.0 - 10.5 K/uL   RBC 2.88 (L) 4.22 - 5.81 MIL/uL   Hemoglobin 8.4 (L) 13.0 - 17.0 g/dL   HCT 26.1 (L) 39.0 - 52.0 %   MCV 90.6 80.0 - 100.0 fL   MCH 29.2 26.0 - 34.0 pg   MCHC 32.2 30.0 - 36.0 g/dL   RDW 16.2 (H) 11.5 - 15.5 %   Platelets 214 150 - 400 K/uL   nRBC 0.0 0.0 - 0.2 %   Neutrophils Relative % 53 %   Neutro Abs 21.7 (H) 1.7 - 7.7 K/uL   Lymphocytes Relative 40 %   Lymphs Abs 16.4 (H) 0.7 - 4.0 K/uL   Monocytes Relative 2 %   Monocytes Absolute 0.8 0.1 - 1.0 K/uL   Eosinophils Relative 0 %   Eosinophils Absolute 0.0 0.0 - 0.5 K/uL   Basophils Relative 0 %   Basophils Absolute 0.0 0.0 - 0.1 K/uL   WBC Morphology DOHLE BODIES     Comment: ABSOLUTE  LYMPHOCYTOSIS MILD LEFT SHIFT (1-5% METAS, OCC MYELO, OCC BANDS) SMUDGE CELLS    RBC Morphology MORPHOLOGY UNREMARKABLE    Smear Review MORPHOLOGY UNREMARKABLE    Metamyelocytes Relative 3 %   Myelocytes 2 %   Abs Immature Granulocytes 2.10 (H) 0.00 - 0.07 K/uL    Comment: Performed at [facility], [address] [place], [place] [address]  Magnesium  Status: Abnormal   Collection Time: 01/22/22  5:24 AM  Result Value Ref Range   Magnesium 1.6 (L) 1.7 - 2.4 mg/dL    Comment: Performed at [facility], [address] [place], [place] [address]  Basic metabolic panel     Status: Abnormal   Collection Time: 01/23/22  4:39 AM  Result Value Ref Range   Sodium 135 135 - 145 mmol/L   Potassium 3.2 (L) 3.5 - 5.1 mmol/L   Chloride 106 98 - 111 mmol/L   CO2 23 22 - 32 mmol/L   Glucose, Bld 94 70 - 99 mg/dL    Comment: Glucose reference range applies only to samples taken after fasting for at least 8 hours.   BUN 9 8 - 23 mg/dL   Creatinine, Ser 0.68 0.61 - 1.24 mg/dL   Calcium 7.7 (L) 8.9 - 10.3 mg/dL   GFR, Estimated >60 >60 mL/min    Comment: (NOTE) Calculated using the CKD-EPI Creatinine Equation (2021)    Anion gap 6 5 - 15    Comment: Performed at [facility], [address], [place], [place] [address]  CBC with Differential/Platelet     Status: Abnormal   Collection Time: 01/23/22  4:39 AM  Result Value Ref Range   WBC 43.3 (H) 4.0 - 10.5 K/uL   RBC 2.66 (L) 4.22 - 5.81 MIL/uL   Hemoglobin 7.7 (L) 13.0 - 17.0 g/dL   HCT 24.4 (L) 39.0 - 52.0 %   MCV 91.7 80.0 - 100.0 fL   MCH 28.9 26.0 - 34.0 pg   MCHC 31.6 30.0 - 36.0 g/dL   RDW 16.7 (H) 11.5 - 15.5 %   Platelets 203 150 - 400 K/uL   nRBC 0.0 0.0 - 0.2 %   Neutrophils Relative % 25 %   Neutro Abs 11.3 (H) 1.7 - 7.7 K/uL   Band Neutrophils 1 %   Lymphocytes Relative 66 %   Lymphs Abs 28.6 (H) 0.7 - 4.0 K/uL   Monocytes Relative 5 %   Monocytes Absolute 2.2 (H) 0.1 - 1.0 K/uL   Eosinophils Relative 0 %    Eosinophils Absolute 0.0 0.0 - 0.5 K/uL   Basophils Relative 0 %   Basophils Absolute 0.0 0.0 - 0.1 K/uL   WBC Morphology DOHLE BODIES     Comment: ABSOLUTE LYMPHOCYTOSIS MILD LEFT SHIFT (1-5% METAS, OCC MYELO, OCC BANDS) SMUDGE CELLS    Smear Review MORPHOLOGY UNREMARKABLE    Metamyelocytes Relative 2 %   Myelocytes 1 %   Abs Immature Granulocytes 1.30 (H) 0.00 - 0.07 K/uL   Ovalocytes PRESENT     Comment: Performed at [facility], [address] [place], [place] [address]  Magnesium     Status: Abnormal   Collection Time: 01/23/22  4:39 AM  Result Value Ref Range   Magnesium 1.4 (L) 1.7 - 2.4 mg/dL    Comment: Performed at [facility], [address], [place], [place] [address]      RADIOGRAPHY: DG Chest 2 View  Result Date: 01/22/2022 CLINICAL DATA:  Cough, nausea. EXAM: CHEST - 2 VIEW COMPARISON:  January 19, 2022. FINDINGS: The heart size and mediastinal contours are within normal limits. Right internal jugular Port-A-Cath is unchanged in position. Stable left upper lobe nodular densities are noted concerning for malignancy or metastatic disease. Small left pleural effusion is noted. The visualized skeletal structures are unremarkable. IMPRESSION: Stable left upper lobe nodular densities are noted concerning for malignancy or metastatic disease. Small left pleural effusion. Electronically Signed  By: Marijo Conception M.D.   On: 01/22/2022 13:38         ASSESSMENT:  1.  Metastatic squamous cell lung cancer to the bones, PD-L1-40%: - Presentation with cough with yellowish expectoration in the middle of May. - Right chest wall pain started in July, worse on coughing and sharp in nature. - No B symptoms. - CT chest (11/07/2021): Irregular bandlike mass traverses the left upper lobe.  1.4 cm solid nodule is identified adjacent to 1.2 cm cystic component in the posterior left upper lobe.  There is prominent left hilar density suspicious for adenopathy.  Cortical destruction of the  right anterior fourth rib suspicious for metastatic disease. - Right iliac bone biopsy (12/06/2021): Metastatic well to moderately differentiated squamous cell carcinoma. - PET scan (11/22/2021): Large central left upper lobe/left suprahilar lung mass with hilar and mediastinal adenopathy.  Bone metastatic disease with large lytic lesion involving right iliac bone.  Destructive bone lesions involving the right second and fourth ribs. - Brain MRI (11/23/2021): No evidence of intracranial metastatic disease. - NGS: PD-L1 (22 C3) TPS-40%, TMB-high, T p53 pathogenic variant, RB1 pathogenic variant, MSI-stable, no other targetable mutations. - Completed palliative XRT to the bone lesions on 01/02/2022 - Cycle 1 of carboplatin, paclitaxel and pembrolizumab on 01/16/2022.   2.  Stage II CLL by [facility] system: - Diagnosed around 2010, observation since then. 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   3.  Social/family history: - Lives at home with his wife.  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  He had exposure to diesel fuel but no asbestos. - 3-4 brothers had lung cancer.  Sister had colon cancer.   PLAN:  1.  Metastatic squamous cell lung cancer to the bones: - Cycle 1 of carboplatin, paclitaxel and pembrolizumab on 01/16/2022. - Status post udenyca on 01/18/2022.  2.  Chemotherapy-induced nausea/vomiting: - The smell of foods turns him away from food.  If he is able to eat it, he is not throwing up. - He has not received Compazine in the last 24 hours as it is ordered as needed. - We will cut back on the dose of Compazine 5 mg IV every 6 hours and make it a standing order. - We will start him on dexamethasone 2 mg every 12 hours. - We will also add Ativan 0.5 mg every 12 hours for better nausea control. - If the above interventions does not work, will consider olanzapine 5 mg daily for 7  days.  3.  Leukocytosis: - Disease from combination of CLL and long-acting G-CSF given on 01/18/2022.  All questions were answered. The patient knows to call the clinic with any problems, questions or concerns. We can certainly see the patient much sooner if necessary.    Derek Jack

## 2022-01-23 NOTE — Progress Notes (Signed)
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 intake with hypokalemia.  Patient has dysgeusia causing nausea.  Currently with minimal oral intake.  Assessment and Plan:  Intractable nausea and vomiting. Dysgeusia. Patient presents with complaints of intractable nausea and vomiting.  Tells me that any smell or any discussion of certain food items causes him to have nausea therefore he is afraid to eat. He eats at home with his nose plugged with cotton balls. Has significant weight loss. Poor p.o. intake has been ongoing for last few months. Marinol initiated.  Currently tolerating.  Will increase the dose.   Add scheduled Klonopin due to refractoriness of dysgeusia.  Klonopin dose reduced per family request due to drowsiness Supportive care   Hypokalemia.  Hypomagnesemia From poor p.o. intake and diarrhea Currently replacing aggressively due to RVR.   SVT. Patient has frequent SVTs.  EKG on 10/23 made comment about possible A-fib although patient does have P wave therefore suspect this is also an SVT. Aggressively replacing electrolytes. Patient is actually on Lopressor at home. will continue with holding parameters. Home dose should be 37.5 mg twice daily.   Prolonged QTc. Monitor.  Correct electrolytes.   Elevated troponin. Mostly secondary to demand ischemia. Echocardiogram this admission shows preserved EF no wall motion abnormality.  No valvular abnormality.   Severe protein calorie malnutrition.  Adult failure to thrive. Body mass index is 20.53 kg/m.  Dietitian consulted.  Continue supportive meds.   Squamous cell carcinoma.  With mets to ribs. CLL. Recent  chemotherapy. Likely contributing to patient's nausea vomiting and dysgeusia as well as diarrhea. Monitor for now. Initiate Florastor for diarrhea. Collagen palliative care consulted   COPD. Currently no exacerbation. Monitor.   Anxiety and depression. Patient is on Xanax and Wellbutrin. Xanax changed to Klonopin.  Monitor for now.   Goals of care discussion. Palliative care consult. Monitor.   Disorientation. On admission patient did have some confusion. Currently mentation better. Monitor blood  Dry cough. Etiology not clear. Examination unremarkable chest x-ray unremarkable. For now we will monitor. Continue aggressive cough suppression.   Subjective:  No n/v today, continue to have diarrhea, no stomach pain Reports productive cough, no chest pain, no sob, he is on room air Very poor appetite, does not want to eat at all Feeling weak and drowsy, would like to try decrease klonopin dose No fever no chills.  Continues to have minimal oral intake. Does not want ensure  Before chemo started he has a lot of pain, it seems chemo helped a pain  Wife at bedside   Physical Exam: Vitals:   01/22/22 1319 01/22/22 1942 01/23/22 0443 01/23/22 0445  BP: (!) 109/59 111/68 110/69   Pulse: 86 100 (!) 101   Resp: 18 (!) 22 20   Temp: 98.2 F (36.8 C) 98.9 F (37.2 C) 98.6 F (37 C)   TempSrc: Oral Oral Oral   SpO2: 96% 93% 90% 93%  Weight:      Height:       General: Appear very weak and deconditioned, chronically ill appearing Cardiovascular: S1 and S2 Present, no Murmur, Respiratory: good respiratory effort, Bilateral Air entry present and CTA, no Crackles, no wheezes Abdomen: Bowel Sound present, Non tender  Extremities: no  Pedal edema Neurology: alert and oriented to time, place, and person  Gait not checked due to patient safety concerns   Data Reviewed: I have Reviewed nursing notes, Vitals, and Lab results since pt's last encounter. Pertinent lab results CBC  and BMP I have ordered test including CBC and BMP I have ordered imaging studies chest x-ray.   Family Communication: Discussed with sister and wife  Disposition: Status is: Inpatient Remains inpatient appropriate because: Poor p.o. intake.  Electrolyte abnormalities, need palliative care and oncology input, wife does not want to take him home and bring him back again due to not eating .  enoxaparin (LOVENOX) injection 40 mg Start: 01/20/22 1000 SCDs Start: 01/20/22 0353   Level of care: Telemetry Continue telemetry due to tachycardia.  Author: Florencia Reasons, MD PhD FACP 01/23/2022 12:35 PM Please look on [email or web address] to find out who is on call.

## 2022-01-24 ENCOUNTER — Inpatient Hospital Stay: Payer: Medicare Other | Admitting: Hematology

## 2022-01-24 ENCOUNTER — Inpatient Hospital Stay: Payer: Medicare Other

## 2022-01-24 DIAGNOSIS — E43 Unspecified severe protein-calorie malnutrition: Secondary | ICD-10-CM

## 2022-01-24 DIAGNOSIS — E860 Dehydration: Secondary | ICD-10-CM

## 2022-01-24 DIAGNOSIS — R112 Nausea with vomiting, unspecified: Secondary | ICD-10-CM | POA: Diagnosis not present

## 2022-01-24 DIAGNOSIS — Z515 Encounter for palliative care: Secondary | ICD-10-CM

## 2022-01-24 DIAGNOSIS — L8990 Pressure ulcer of unspecified site, unspecified stage: Secondary | ICD-10-CM | POA: Insufficient documentation

## 2022-01-24 DIAGNOSIS — Z7189 Other specified counseling: Secondary | ICD-10-CM

## 2022-01-24 LAB — BASIC METABOLIC PANEL
Anion gap: 9 (ref 5–15)
BUN: 7 mg/dL — ABNORMAL LOW (ref 8–23)
CO2: 23 mmol/L (ref 22–32)
Calcium: 8 mg/dL — ABNORMAL LOW (ref 8.9–10.3)
Chloride: 104 mmol/L (ref 98–111)
Creatinine, Ser: 0.68 mg/dL (ref 0.61–1.24)
GFR, Estimated: >60 mL/min (ref >60)
Glucose, Bld: 106 mg/dL — ABNORMAL HIGH (ref 70–99)
Potassium: 3.2 mmol/L — ABNORMAL LOW (ref 3.5–5.1)
Sodium: 136 mmol/L (ref 135–145)

## 2022-01-24 LAB — PHOSPHORUS: Phosphorus: 2.2 mg/dL — ABNORMAL LOW (ref 2.5–4.6)

## 2022-01-24 LAB — CBC WITH DIFFERENTIAL/PLATELET
Abs Immature Granulocytes: 3.4 10*3/uL — ABNORMAL HIGH (ref 0.00–0.07)
Band Neutrophils: 10 %
Basophils Absolute: 0 10*3/uL (ref 0.0–0.1)
Basophils Relative: 0 %
Eosinophils Absolute: 0 10*3/uL (ref 0.0–0.5)
Eosinophils Relative: 0 %
HCT: 26.8 % — ABNORMAL LOW (ref 39.0–52.0)
Hemoglobin: 8.4 g/dL — ABNORMAL LOW (ref 13.0–17.0)
Lymphocytes Relative: 52 %
Lymphs Abs: 29.4 10*3/uL — ABNORMAL HIGH (ref 0.7–4.0)
MCH: 28.5 pg (ref 26.0–34.0)
MCHC: 31.3 g/dL (ref 30.0–36.0)
MCV: 90.8 fL (ref 80.0–100.0)
Metamyelocytes Relative: 6 %
Monocytes Absolute: 1.7 10*3/uL — ABNORMAL HIGH (ref 0.1–1.0)
Monocytes Relative: 3 %
Neutro Abs: 22 10*3/uL — ABNORMAL HIGH (ref 1.7–7.7)
Neutrophils Relative %: 29 %
Platelets: 200 10*3/uL (ref 150–400)
RBC: 2.95 MIL/uL — ABNORMAL LOW (ref 4.22–5.81)
RDW: 16.7 % — ABNORMAL HIGH (ref 11.5–15.5)
WBC: 56.5 10*3/uL (ref 4.0–10.5)
nRBC: 0 % (ref 0.0–0.2)

## 2022-01-24 LAB — MAGNESIUM: Magnesium: 1.7 mg/dL (ref 1.7–2.4)

## 2022-01-24 MED ORDER — CELECOXIB 100 MG PO CAPS: 100.0000 mg | ORAL_CAPSULE | Freq: Two times a day (BID) | ORAL | Status: DC | PRN

## 2022-01-24 MED ORDER — BENZONATATE 100 MG PO CAPS: 100.0000 mg | ORAL_CAPSULE | Freq: Three times a day (TID) | ORAL | 0 refills | Status: DC | PRN

## 2022-01-24 MED ORDER — SODIUM PHOSPHATES 45 MMOLE/15ML IV SOLN
15.0000 mmol | Freq: Once | INTRAVENOUS | Status: AC
  Administered 2022-01-24: 15 mmol via INTRAVENOUS
  Filled 2022-01-24: qty 5

## 2022-01-24 MED ORDER — POTASSIUM CHLORIDE CRYS ER 20 MEQ PO TBCR
40.0000 meq | EXTENDED_RELEASE_TABLET | Freq: Once | ORAL | Status: AC
  Administered 2022-01-24: 40 meq via ORAL
  Filled 2022-01-24: qty 2

## 2022-01-24 MED ORDER — DRONABINOL 5 MG PO CAPS: 5.0000 mg | ORAL_CAPSULE | Freq: Two times a day (BID) | ORAL | 0 refills | Status: DC

## 2022-01-24 MED ORDER — DEXAMETHASONE 2 MG PO TABS: 2.0000 mg | ORAL_TABLET | Freq: Every day | ORAL | 0 refills | Status: DC

## 2022-01-24 MED ORDER — LORAZEPAM 0.5 MG PO TABS: 0.5000 mg | ORAL_TABLET | Freq: Every evening | ORAL | 0 refills | Status: DC | PRN

## 2022-01-24 MED ORDER — MAGNESIUM SULFATE IN D5W 1-5 GM/100ML-% IV SOLN
1.0000 g | Freq: Once | INTRAVENOUS | Status: AC
  Administered 2022-01-24: 1 g via INTRAVENOUS
  Filled 2022-01-24: qty 100

## 2022-01-24 MED ORDER — LOPERAMIDE HCL 2 MG PO CAPS: 2.0000 mg | ORAL_CAPSULE | Freq: Four times a day (QID) | ORAL | 0 refills | Status: AC | PRN

## 2022-01-24 MED ORDER — GUAIFENESIN ER 600 MG PO TB12: 600.0000 mg | ORAL_TABLET | Freq: Two times a day (BID) | ORAL | Status: DC

## 2022-01-24 MED ORDER — DEXTROMETHORPHAN POLISTIREX ER 30 MG/5ML PO SUER: 30.0000 mg | Freq: Every evening | ORAL | 0 refills | Status: DC | PRN

## 2022-01-24 NOTE — Progress Notes (Signed)
CRITICAL LAB VALUE PAGED TO [facility], XU OF WBC 56.5 NO NEW ORDERS AT THIS TIME

## 2022-01-24 NOTE — Discharge Summary (Signed)
Discharge Summary  Alfred Brown [facility] W[facility]:[id number] DOB: Nov 21, 1950  PCP: Redmond School, MD  Admit date: 01/19/2022 Discharge date: 01/24/2022  30 Day Unplanned Readmission Risk Score    Flowsheet Row ED to Hosp-Admission (Current) from 01/19/2022 in [place]  30 Day Unplanned Readmission Risk Score (%) 23.84 Filed at 01/24/2022 1600       This score is the patient's risk of an unplanned readmission within 30 days of being discharged (0 -100%). The score is based on dignosis, age, lab data, medications, orders, and past utilization.   Low:  0-14.9   Medium: 15-21.9   High: 22-29.9   Extreme: 30 and above         Time spent: 59mins, more than 50% time spent on coordination of care.   Recommendations for Outpatient Follow-up:  F/u with oncology Dr Raliegh Ip next week  Home health  /community palliative care referral order placed     Discharge Diagnoses:  Active Hospital Problems   Diagnosis Date Noted   Intractable nausea and vomiting 12/18/2021   Pressure injury of skin 01/24/2022   Protein-calorie malnutrition, severe 01/21/2022   Hypokalemia 01/20/2022   Hypoalbuminemia due to protein-calorie malnutrition ([place]) 01/20/2022   Depression 01/20/2022   Failure to thrive in adult 01/20/2022   Unintentional weight loss 01/20/2022   Prolonged QT interval 01/20/2022   SVT (supraventricular tachycardia) 01/20/2022   Sinus tachycardia 01/20/2022   Squamous cell lung cancer, left (HCC) 01/07/2022   Generalized anxiety disorder 12/18/2021   COPD (chronic obstructive pulmonary disease) ([place]) 08/05/2019   Chronic lymphocytic leukemia ([place])     Resolved Hospital Problems  No resolved problems to display.    Discharge Condition: stable  Diet recommendation: regular diet   Filed Weights   01/19/22 2142 01/20/22 2129  Weight: 68 kg 70.6 kg    History of present illness: ( per admitting MD Dr Josephine Cables)  HPI: Alfred Brown is a 71 y.o. male with medical  history significant of CLL (diagnosed in 2010, now on surveillance), mild COPD, anxiety/depression and left upper lobe squamous cell lung cancer with metastasis to bone who presents to the emergency department accompanied by wife due to 3-day onset of generalized weakness and nausea.  Patient had first chemotherapy for the lung cancer on Wednesday (10/18) .  On the following day (Thursday -10/19), patient had 2 episodes of watery stool, he took Imodium and symptoms resolved by next morning.  On Friday (10/20), he was nauseous and had an episode of nonbloody vomiting. 1 L of IV NS was given at the cancer center   He continued to take his nausea medication, but continues to be nauseous and had no taste for food, he said that food smells bad, he has also had decreased fluid intake.  Patient was noted to be disoriented today and patient was witnessed taking a razor to brush his teeth.  This led to the wife decided to take the patient to the ED for further evaluation and management.   ED Course:  In the emergency department, he was tachycardic and intermittently tachypneic, BP on arrival was 114/62, O2 sat was 97% on room air.  Work-up in the ED showed leukocytosis and normocytic anemia.  Troponin x122, lipase 26, BMP was normal except for potassium 3.3 hypoalbuminemia, urinalysis was normal.  Influenza A, B, SARS coronavirus 2 was negative. CT head without contrast showed no acute intracranial abnormality Chest x-ray showed no acute cardiopulmonary process and a 2 cm left upper lobe subpleural nodule  He was treated with Lopressor 5 mg IV and Zofran.  IV hydration was provided.  Hospitalist was asked to admit patient for further evaluation and management.   Hospital Course:  Principal Problem:   Intractable nausea and vomiting Active Problems:   Chronic lymphocytic leukemia (HCC)   COPD (chronic obstructive pulmonary disease) (HCC)   Generalized anxiety disorder   Squamous cell lung cancer, left (HCC)    Hypokalemia   Hypoalbuminemia due to protein-calorie malnutrition (HCC)   Depression   Failure to thrive in adult   Unintentional weight loss   Prolonged QT interval   SVT (supraventricular tachycardia)   Sinus tachycardia   Protein-calorie malnutrition, severe   Pressure injury of skin   Intractable nausea and vomiting. Dysgeusia. -States any smell or any discussion of certain food items causes him to have nausea therefore he is afraid to eat. He eats at home with his nose plugged with cotton balls. -Poor p.o. intake has been ongoing for last few months., Has significant weight loss. -Marinol initiated, appear is helping, he would like to continue -seen by oncology Dr Raliegh Ip , meds adjusted for CINV, please see below -he has improved , no N/V in the last 24hrs, with increased oral intake, he desires to go home.   Hypokalemia.  Hypomagnesemia From poor p.o. intake and diarrhea Replaced    SVT. Patient has frequent SVTs.  EKG on 10/23 made comment about possible A-fib although patient does have P wave therefore suspect this is also an SVT. Aggressively replacing electrolytes. Patient is actually on Lopressor at home. will continue with holding parameters. Home dose should be 37.5 mg twice daily.   Prolonged QTc. Monitor.  Correct electrolytes.   Elevated troponin. Mostly secondary to demand ischemia. Echocardiogram this admission shows preserved EF no wall motion abnormality.  No valvular abnormality.   Severe protein calorie malnutrition.  Adult failure to thrive. Body mass index is 20.53 kg/m.  Dietitian consulted.  Continue supportive meds.   Squamous cell carcinoma.  With mets to ribs. CLL. Recent chemotherapy. Likely contributing to patient's nausea vomiting and dysgeusia as well as diarrhea. Monitor for now.   COPD. Currently no exacerbation. Monitor.   Anxiety and depression. Patient is on Xanax and Wellbutrin.    Goals of care discussion. Palliative care  consult. Monitor.   Disorientation. On admission patient did have some confusion. Resolved, wife think if from klonopin    Dry cough. Cxr "Stable left upper lobe nodular densities are noted concerning for malignancy or metastatic disease. Small left pleural effusion" Improved  Continue aggressive cough suppression.     Discharge Exam: BP (!) 113/53 (BP Location: Right Arm)   Pulse 100   Temp 98.8 F (37.1 C) (Oral)   Resp 17   Ht 6\' 1"  (1.854 m)   Wt 70.6 kg   SpO2 98%   BMI 20.53 kg/m   General: NAD, aaox3 Cardiovascular: RRR Respiratory: Normal respiratory effort    Discharge Instructions     Diet general   Complete by: As directed    Discharge wound care:   Complete by: As directed    Pressure offloading   Increase activity slowly   Complete by: As directed       Allergies as of 01/24/2022   No Known Allergies      Medication List     STOP taking these medications    ALPRAZolam 1 MG tablet Commonly known as: XANAX   sennosides-docusate sodium 8.6-50 MG tablet Commonly known as: SENOKOT-S  TAKE these medications    acetaminophen 500 MG tablet Commonly known as: TYLENOL Take 500-1,000 mg by mouth every 6 (six) hours as needed for moderate pain.   albuterol 108 (90 Base) MCG/ACT inhaler Commonly known as: VENTOLIN HFA 2 puffs every 4 hours as needed if you can't catch your breath   aspirin 325 MG tablet Take 325 mg by mouth daily.   benzonatate 100 MG capsule Commonly known as: TESSALON Take 1 capsule (100 mg total) by mouth 3 (three) times daily as needed for cough.   buPROPion 150 MG 12 hr tablet Commonly known as: WELLBUTRIN SR Take 150 mg by mouth daily.   CALCIUM 600 + D PO Take 1 tablet by mouth daily.   CARBOPLATIN IV Inject into the vein every 21 ( twenty-one) days.   celecoxib 100 MG capsule Commonly known as: CELEBREX Take 1 capsule (100 mg total) by mouth 2 (two) times daily as needed for moderate  pain. What changed:  when to take this reasons to take this   clotrimazole-betamethasone cream Commonly known as: LOTRISONE Apply 1 application  topically daily as needed (rash).   dexamethasone 2 MG tablet Commonly known as: DECADRON Take 1 tablet (2 mg total) by mouth daily.   dextromethorphan 30 MG/5ML liquid Commonly known as: DELSYM Take 5 mLs (30 mg total) by mouth at bedtime as needed for cough.   diltiazem 30 MG tablet Commonly known as: CARDIZEM Take 30 mg by mouth as needed.   dronabinol 5 MG capsule Commonly known as: MARINOL Take 1 capsule (5 mg total) by mouth 2 (two) times daily before lunch and supper. Start taking on: January 25, 2022   fish oil-omega-3 fatty acids 1000 MG capsule Take 1 g by mouth daily.   fluticasone 50 MCG/ACT nasal spray Commonly known as: FLONASE Place 2 sprays into the nose daily.   guaiFENesin 600 MG 12 hr tablet Commonly known as: MUCINEX Take 1 tablet (600 mg total) by mouth 2 (two) times daily.   lidocaine 5 % Commonly known as: Lidoderm Place 1 patch onto the skin daily. Remove and Discard patch within 12 hours. What changed:  when to take this reasons to take this   lidocaine-prilocaine cream Commonly known as: EMLA Apply a small amount to port a cath site and cover with plastic wrap 1 hour prior to infusion appointments   loperamide 2 MG capsule Commonly known as: IMODIUM Take 1 capsule (2 mg total) by mouth every 6 (six) hours as needed for diarrhea or loose stools.   Loratadine 10 MG Caps Take 10 mg by mouth daily.   LORazepam 0.5 MG tablet Commonly known as: ATIVAN Take 1 tablet (0.5 mg total) by mouth at bedtime as needed for anxiety.   lovastatin 20 MG tablet Commonly known as: MEVACOR Take 20 mg by mouth daily.   metoprolol tartrate 25 MG tablet Commonly known as: LOPRESSOR Take 1 tablet (25 mg total) by mouth 2 (two) times daily. What changed: how much to take   multivitamin tablet Take 1 tablet  by mouth daily.   niacin 500 MG ER tablet Commonly known as: VITAMIN B3 Take 500 mg by mouth daily.   Oxycodone HCl 10 MG Tabs Take 1 tablet (10 mg total) by mouth every 4 (four) hours as needed.   oxyCODONE 20 mg 12 hr tablet Commonly known as: OXYCONTIN Take 1 tablet (20 mg total) by mouth every 12 (twelve) hours.   PACLITAXEL IV Inject into the vein every 21 ( twenty-one)  days.   PEMBROLIZUMAB IV Inject into the vein every 21 ( twenty-one) days.   polyethylene glycol 17 g packet Commonly known as: MIRALAX / GLYCOLAX Take 17 g by mouth daily as needed for moderate constipation.   prochlorperazine 5 MG tablet Commonly known as: COMPAZINE Take 1 tablet (5 mg total) by mouth every 6 (six) hours as needed for nausea or vomiting.   prochlorperazine 10 MG tablet Commonly known as: COMPAZINE Take 1 tablet (10 mg total) by mouth every 6 (six) hours as needed for nausea or vomiting.   valACYclovir 500 MG tablet Commonly known as: VALTREX Take 500 mg by mouth daily.   zolpidem 10 MG tablet Commonly known as: AMBIEN Take 10 mg by mouth at bedtime as needed for sleep.               Discharge Care Instructions  (From admission, onward)           Start     Ordered   01/24/22 0000  Discharge wound care:       Comments: Pressure offloading   01/24/22 1623           No Known Allergies    The results of significant diagnostics from this hospitalization (including imaging, microbiology, ancillary and laboratory) are listed below for reference.    Significant Diagnostic Studies: DG Chest 2 View  Result Date: 01/22/2022 CLINICAL DATA:  Cough, nausea. EXAM: CHEST - 2 VIEW COMPARISON:  January 19, 2022. FINDINGS: The heart size and mediastinal contours are within normal limits. Right internal jugular Port-A-Cath is unchanged in position. Stable left upper lobe nodular densities are noted concerning for malignancy or metastatic disease. Small left pleural effusion  is noted. The visualized skeletal structures are unremarkable. IMPRESSION: Stable left upper lobe nodular densities are noted concerning for malignancy or metastatic disease. Small left pleural effusion. Electronically Signed   By: Marijo Conception M.D.   On: 01/22/2022 13:38   ECHOCARDIOGRAM LIMITED  Result Date: 01/21/2022    ECHOCARDIOGRAM LIMITED REPORT   Patient Name:   Alfred Brown Date of Exam: 01/21/2022 Medical Rec #:  [id number]      Height:       73.0 in Accession #:    [id number]     Weight:       155.6 lb Date of Birth:  1950-08-25       BSA:          1.934 m Patient Age:    42 years       BP:           106/73 mmHg Patient Gender: M              HR:           96 bpm. Exam Location:  [facility] Procedure: Limited Echo Indications:    Multifocal atrial tachycardia  History:        Patient has prior history of Echocardiogram examinations, most                 recent 11/01/2021. COPD, Arrythmias:RBBB and Tachycardia; Risk                 Factors:Dyslipidemia and Current Smoker. Lung CA, Bicuspid                 Aortic valve.  Sonographer:    Wenda Low Referring Phys: [id number] [place]  1. Left ventricular ejection fraction, by estimation, is 60 to 65%. The left  ventricle has normal function. The left ventricle has no regional wall motion abnormalities. There is moderate left ventricular hypertrophy.  2. Right ventricular systolic function is normal. The right ventricular size is normal.  3. The aortic valve has an indeterminant number of cusps.  4. Limited echo FINDINGS  Left Ventricle: Left ventricular ejection fraction, by estimation, is 60 to 65%. The left ventricle has normal function. The left ventricle has no regional wall motion abnormalities. The left ventricular internal cavity size was normal in size. There is  moderate left ventricular hypertrophy. Right Ventricle: The right ventricular size is normal. Right vetricular wall thickness was not well visualized. Right  ventricular systolic function is normal. Left Atrium: Left atrial size was normal in size. Right Atrium: Right atrial size was normal in size. Pericardium: There is no evidence of pericardial effusion. Aortic Valve: The aortic valve has an indeterminant number of cusps. Aorta: The aortic root is normal in size and structure. LEFT VENTRICLE PLAX 2D LVIDd:         5.00 cm LVIDs:         3.10 cm LV PW:         1.30 cm LV IVS:        1.30 cm LVOT diam:     2.00 cm LVOT Area:     3.14 cm  RIGHT VENTRICLE RV Basal diam:  3.60 cm RV Mid diam:    2.60 cm TAPSE (M-mode): 3.1 cm LEFT ATRIUM             Index        RIGHT ATRIUM           Index LA diam:        3.30 cm 1.71 cm/m   RA Area:     15.30 cm LA Vol (A2C):   45.2 ml 23.37 ml/m  RA Volume:   41.40 ml  21.41 ml/m LA Vol (A4C):   46.9 ml 24.25 ml/m LA Biplane Vol: 50.3 ml 26.01 ml/m   AORTA Ao Root diam: 3.80 cm Ao Asc diam:  3.60 cm  SHUNTS Systemic Diam: 2.00 cm Carlyle Dolly MD Electronically signed by Carlyle Dolly MD Signature Date/Time: 01/21/2022/3:33:06 PM    Final    DG Abd 2 Views  Result Date: 01/20/2022 CLINICAL DATA:  Intractable nausea and vomiting EXAM: ABDOMEN - 2 VIEW COMPARISON:  None Available. FINDINGS: No dilated large or small bowel loops are identified. No evidence of free intraperitoneal air. No evidence of soft tissue mass or abnormal fluid collection. No evidence of renal or ureteral calculi. Lung bases appear clear. No acute-appearing osseous abnormality. IMPRESSION: No acute findings.  Nonobstructive bowel gas pattern. Electronically Signed   By: Franki Cabot M.D.   On: 01/20/2022 09:34   CT Head Wo Contrast  Result Date: 01/19/2022 CLINICAL DATA:  Mental status change, unknown cause EXAM: CT HEAD WITHOUT CONTRAST TECHNIQUE: Contiguous axial images were obtained from the base of the skull through the vertex without intravenous contrast. RADIATION DOSE REDUCTION: This exam was performed according to the departmental  dose-optimization program which includes automated exposure control, adjustment of the mA and/or kV according to patient size and/or use of iterative reconstruction technique. COMPARISON:  CT max face 11/07/2021, MRI head 01/13/2013 FINDINGS: Brain: No evidence of large-territorial acute infarction. No parenchymal hemorrhage. No mass lesion. No extra-axial collection. No mass effect or midline shift. No hydrocephalus. Basilar cisterns are patent. Vascular: No hyperdense vessel. Atherosclerotic calcifications are present within the cavernous internal carotid arteries. Skull:  No acute fracture or focal lesion. Sinuses/Orbits: Paranasal sinuses and mastoid air cells are clear. The orbits are unremarkable. Other: None. IMPRESSION: No acute intracranial abnormality. Electronically Signed   By: Iven Finn M.D.   On: 01/19/2022 22:59   DG Chest 1 View  Result Date: 01/19/2022 CLINICAL DATA:  Chest pain. EXAM: CHEST  1 VIEW COMPARISON:  Chest radiograph dated 12/19/2018. PET CT dated 11/22/2021. FINDINGS: Right-sided Port-A-Cath with tip at the cavoatrial junction. A 2 cm left upper lobe subpleural nodule. No consolidative changes. No pleural effusion pneumothorax. The cardiac silhouette is within limits. Atherosclerotic calcification of the aorta. No acute osseous pathology. IMPRESSION: 1. No acute cardiopulmonary process. 2. A 2 cm left upper lobe subpleural nodule. Electronically Signed   By: Anner Crete M.D.   On: 01/19/2022 22:47   IR IMAGING GUIDED PORT INSERTION  Result Date: 01/14/2022 INDICATION: 71 year old male with right-sided non-small cell lung cancer. He presents for port catheter placement. EXAM: IMPLANTED PORT A CATH PLACEMENT WITH ULTRASOUND AND FLUOROSCOPIC GUIDANCE MEDICATIONS: None. ANESTHESIA/SEDATION: Versed two mg IV; Fentanyl 75 mcg IV; Moderate Sedation Time:  17 minute The patient's vital signs and level of consciousness were continuously monitored during the procedure by the  interventional radiology nurse under my direct supervision. FLUOROSCOPY: Radiation exposure index: 3 mGy reference air kerma) COMPLICATIONS: None immediate. PROCEDURE: The right neck and chest was prepped with chlorhexidine, and draped in the usual sterile fashion using maximum barrier technique (cap and mask, sterile gown, sterile gloves, large sterile sheet, hand hygiene and cutaneous antiseptic). Local anesthesia was attained by infiltration with 1% lidocaine with epinephrine. Ultrasound demonstrated patency of the right internal jugular vein, and this was documented with an image. Under real-time ultrasound guidance, this vein was accessed with a 21 gauge micropuncture needle and image documentation was performed. A small dermatotomy was made at the access site with an 11 scalpel. A 0.018" wire was advanced into the SVC and the access needle exchanged for a 22F micropuncture vascular sheath. The 0.018" wire was then removed and a 0.035" wire advanced into the IVC. An appropriate location for the subcutaneous reservoir was selected below the clavicle and an incision was made through the skin and underlying soft tissues. The subcutaneous tissues were then dissected using a combination of blunt and sharp surgical technique and a pocket was formed. A single lumen low-profile power injectable portacatheter was then tunneled through the subcutaneous tissues from the pocket to the dermatotomy and the port reservoir placed within the subcutaneous pocket. The venous access site was then serially dilated and a peel away vascular sheath placed over the wire. The wire was removed and the port catheter advanced into position under fluoroscopic guidance. The catheter tip is positioned in the superior cavoatrial junction. This was documented with a spot image. The portacatheter was then tested and found to flush and aspirate well. The port was flushed with saline followed by 100 units/mL heparinized saline. The pocket was then  closed in two layers using first subdermal inverted interrupted absorbable sutures followed by a running subcuticular suture. The epidermis was then sealed with Dermabond. The dermatotomy at the venous access site was also closed with Dermabond. IMPRESSION: Successful placement of a right IJ approach Power Port with ultrasound and fluoroscopic guidance. The catheter is ready for use. Electronically Signed   By: Jacqulynn Cadet M.D.   On: 01/14/2022 12:52    Microbiology: Recent Results (from the past 240 hour(s))  Resp Panel by RT-PCR (Flu A&B, Covid) Anterior Nasal Swab  Status: None   Collection Time: 01/19/22 10:20 PM   Specimen: Anterior Nasal Swab  Result Value Ref Range Status   SARS Coronavirus 2 by RT PCR NEGATIVE NEGATIVE Final    Comment: (NOTE) SARS-CoV-2 target nucleic acids are NOT DETECTED.  The SARS-CoV-2 RNA is generally detectable in upper respiratory specimens during the acute phase of infection. The lowest concentration of SARS-CoV-2 viral copies this assay can detect is 138 copies/mL. A negative result does not preclude SARS-Cov-2 infection and should not be used as the sole basis for treatment or other patient management decisions. A negative result may occur with  improper specimen collection/handling, submission of specimen other than nasopharyngeal swab, presence of viral mutation(s) within the areas targeted by this assay, and inadequate number of viral copies(<138 copies/mL). A negative result must be combined with clinical observations, patient history, and epidemiological information. The expected result is Negative.  Fact Sheet for Patients:  EntrepreneurPulse.com.au  Fact Sheet for Healthcare Providers:  IncredibleEmployment.be  This test is no t yet approved or cleared by the [place] FDA and  has been authorized for detection and/or diagnosis of SARS-CoV-2 by FDA under an Emergency Use Authorization (EUA).  This EUA will remain  in effect (meaning this test can be used) for the duration of the COVID-19 declaration under Section 564(b)(1) of the Act, 21 U.S.C.section 360bbb-3(b)(1), unless the authorization is terminated  or revoked sooner.       Influenza A by PCR NEGATIVE NEGATIVE Final   Influenza B by PCR NEGATIVE NEGATIVE Final    Comment: (NOTE) The Xpert Xpress SARS-CoV-2/FLU/RSV plus assay is intended as an aid in the diagnosis of influenza from Nasopharyngeal swab specimens and should not be used as a sole basis for treatment. Nasal washings and aspirates are unacceptable for Xpert Xpress SARS-CoV-2/FLU/RSV testing.  Fact Sheet for Patients: EntrepreneurPulse.com.au  Fact Sheet for Healthcare Providers: IncredibleEmployment.be  This test is not yet approved or cleared by the [place] FDA and has been authorized for detection and/or diagnosis of SARS-CoV-2 by FDA under an Emergency Use Authorization (EUA). This EUA will remain in effect (meaning this test can be used) for the duration of the COVID-19 declaration under Section 564(b)(1) of the Act, 21 U.S.C. section 360bbb-3(b)(1), unless the authorization is terminated or revoked.  Performed at [facility], [address], [place], [place] [address]   Culture, blood (routine x 2)     Status: None (Preliminary result)   Collection Time: 01/19/22 11:57 PM   Specimen: Left Antecubital; Blood  Result Value Ref Range Status   Specimen Description LEFT ANTECUBITAL  Final   Special Requests   Final    BOTTLES DRAWN AEROBIC AND ANAEROBIC Blood Culture adequate volume   Culture   Final    NO GROWTH 4 DAYS Performed at [facility], [address] [place], [place] [address]    Report Status PENDING  Incomplete  Culture, blood (routine x 2)     Status: None (Preliminary result)   Collection Time: 01/20/22 12:03 AM   Specimen: BLOOD LEFT ARM  Result Value Ref Range Status   Specimen  Description BLOOD LEFT ARM  Final   Special Requests   Final    BOTTLES DRAWN AEROBIC ONLY Blood Culture adequate volume   Culture   Final    NO GROWTH 4 DAYS Performed at [facility], [address], [place],  [address]    Report Status PENDING  Incomplete     Labs: Basic Metabolic Panel: Recent Labs  Lab 01/20/22 0403 01/20/22 1307 01/21/22 0524 01/21/22 0800 01/21/22 1354 01/22/22 0524 01/23/22 0439 01/24/22 0400  NA  --    < > 135  --  132* 135 135 136  K  --    < > 2.9*  --  3.4* 3.7 3.2* 3.2*  CL  --    < > 100  --  98 106 106 104  CO2  --    < > 25  --  26 23 23 23   GLUCOSE  --    < > 88  --  101* 94 94 106*  BUN  --    < > 9  --  10 11 9  7*  CREATININE 0.67   < > 0.60*  --  0.64 0.68 0.68 0.68  CALCIUM  --    < > 7.7*  --  7.6* 7.7* 7.7* 8.0*  MG 1.5*   < >  --  1.5* 1.9 1.6* 1.4* 1.7  PHOS 2.5  --   --   --   --   --   --  2.2*   < > = values in this interval not displayed.   Liver Function Tests: Recent Labs  Lab 01/19/22 2325 01/21/22 0524  AST 20 16  ALT 27 20  ALKPHOS 96 82  BILITOT 1.2 0.9  PROT 5.9* 4.3*  ALBUMIN 2.8* 2.1*   Recent Labs  Lab 01/19/22 2325  LIPASE 26   Recent Labs  Lab 01/20/22 1307  AMMONIA <10   CBC: Recent Labs  Lab 01/19/22 2325 01/20/22 0403 01/21/22 0524 01/22/22 0524 01/23/22 0439 01/24/22 0400  WBC 62.4* 54.9* 45.9* 41.0* 43.3* 56.5*  NEUTROABS 15.3*  --   --  21.7* 11.3* 22.0*  HGB 10.9* 9.6* 8.3* 8.4* 7.7* 8.4*  HCT 33.2* 29.6* 25.8* 26.1* 24.4* 26.8*  MCV 88.1 89.4 90.2 90.6 91.7 90.8  PLT 279 239 217 214 203 200   Cardiac Enzymes: No results for input(s): "CKTOTAL", "CKMB", "CKMBINDEX", "TROPONINI" in the last 168 hours. BNP: BNP (last 3 results) No results for input(s): "BNP" in the last 8760 hours.  ProBNP (last 3 results) No results for input(s): "PROBNP" in the last 8760 hours.  CBG: No results for input(s): "GLUCAP" in the last 168 hours.  FURTHER DISCHARGE INSTRUCTIONS:   Get  Medicines reviewed and adjusted: Please take all your medications with you for your next visit with your Primary MD   Laboratory/radiological data: Please request your Primary MD to go over all hospital tests and procedure/radiological results at the follow up, please ask your Primary MD to get all Hospital records sent to his/her office.   In some cases, they will be blood work, cultures and biopsy results pending at the time of your discharge. Please request that your primary care M.D. goes through all the records of your hospital data and follows up on these results.   Also Note the following: If you experience worsening of your admission symptoms, develop shortness of breath, life threatening emergency, suicidal or homicidal thoughts you must seek medical attention immediately by calling 911 or calling your MD immediately  if symptoms less severe.   You must read complete instructions/literature along with all the possible adverse reactions/side effects for all the Medicines you take and that have been prescribed to you. Take any new Medicines after you have completely understood and accpet all the possible adverse reactions/side effects.    Do not drive when taking Pain medications or sleeping medications (Benzodaizepines)   Do  not take more than prescribed Pain, Sleep and Anxiety Medications. It is not advisable to combine anxiety,sleep and pain medications without talking with your primary care practitioner   Special Instructions: If you have smoked or chewed Tobacco  in the last 2 yrs please stop smoking, stop any regular Alcohol  and or any Recreational drug use.   Wear Seat belts while driving.   Please note: You were cared for by a hospitalist during your hospital stay. Once you are discharged, your primary care physician will handle any further medical issues. Please note that NO REFILLS for any discharge medications will be authorized once you are discharged, as it is imperative  that you return to your primary care physician (or establish a relationship with a primary care physician if you do not have one) for your post hospital discharge needs so that they can reassess your need for medications and monitor your lab values.     Signed:  Florencia Reasons MD, PhD, FACP  [facility] 01/24/2022, 5:14 PM

## 2022-01-24 NOTE — Consult Note (Signed)
Palliative Care Consult Note                                  Date: 01/24/2022   Patient Name: [facility]  DOB: September 01, 1950  MRN: [id number]  Age / Sex: 71 y.o., male  PCP: Redmond School, MD Referring Physician: Florencia Reasons, MD  Reason for Consultation: Establishing goals of care  HPI/Patient Profile: 71 y.o. 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 intake with hypokalemia.  Patient has dysgeusia causing nausea.  Currently with minimal oral intake.   PMT was consulted for [place] conversations.  Past Medical History:  Diagnosis Date   Aortic valve disorder    Mild insufficiency   Chronic lymphocytic leukemia ([place])    01/2012: WBC-90.2, H&H-15.1/45.3, platelets-185 03/31/12: Verified with Dr. Tressie Stalker that no precautions or modification of medical regime are required prior to orthopaedic surgery.    CLL (chronic lymphocytic leukemia) (HCC)    CMV (cytomegalovirus) (HCC)    COPD (chronic obstructive pulmonary disease) (HCC)    Depression with anxiety    DJD (degenerative joint disease)    GERD (gastroesophageal reflux disease)    Hyperlipidemia    Hypogammaglobulinemia ([place]) 09/23/2019   Lipoma    left chest wall   Palpitations 2004   PVCs; borderline stress nuclear in 2004, negative in 2007; Echo 2007; AV-sclerotic, very mild AI   Pneumonia    Right bundle branch block    + left posterior fascicular block   Tobacco abuse    80 pack years    Subjective:   This NP Walden Field reviewed medical records, received report from team, assessed the patient and then meet at the patient's bedside to discuss diagnosis, prognosis, GOC, EOL wishes disposition and options.  I met with the patient and his wife at the bedside.   Concept of Palliative Care was introduced as  specialized medical care for people and their families living with serious illness.  If focuses on providing relief from the symptoms and stress of a serious illness.  The goal is to improve quality of life for both the patient and the family. Values and goals of care important to patient and family were attempted to be elicited.  Created space and opportunity for patient  and family to explore thoughts and feelings regarding current medical situation   Natural trajectory and current clinical status were discussed. Questions and concerns addressed. Patient  encouraged to call with questions or concerns.    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  He started chemotherapy recently and has had 1 treatment.  Since then he has had intractable nausea and vomiting associated with poor intake and significant weight loss.  We discussed further elements of his clinical status including intake, chronic comorbidities such as COPD (which can also affect his breathing along with new SCC), chemotherapy associated side effects.  We reviewed labs and imaging.  Life Review: Completed a brief life review with the patient.  He is currently married to his wife [name] who is his primary support.  They live together in the home.  Goals: To get better and be able to tolerate more intake, go home, follow-up with  oncology for further treatment options.  Today's Discussion: In addition to discussions described above we had further substantial discussion on various topics.  We discussed that breakfast is the biggest problem for him.  He has had deguisea making it near impossible to eat.  He does help if he stops his medicine up (with cotton balls) because just a small food will make him nauseous.  On current regimen in the hospital his nausea and vomiting is better compared to admission.  He does  still have diarrhea and had 3 bouts yesterday, although Imodium is helping he has not had a loose stools today.    We discussed potential side effects of chemotherapy including nausea vomiting resulting in poor intake and weight loss.  We discussed the need to make all attempts to continue oral intake in order to have the strength to fight his cancer.  He states Dr. Ellin Saba saw him in the hospital and states that now that he knows he is more sensitive than they can attempt premedication to prevent further episodes associated with chemotherapy.  He does state that the chemotherapy has helped his pain (pain likely from bone mets).  We discussed goals of care and the spectrum of available treatments.  At this point he and his wife are committed to full code, full scope of care that is available.  I again emphasized the need for good nutrition in order to have the strength to continue chemotherapy.  I discussed his situation with the hospitalist Dr. Roda Shutters.  She feels that they can get normal vomiting better control of her tolerate some form of oral intake and likely discharge home for further monitoring, treatment, prevention with oncology.  I provided emotional and general support through therapeutic listening, empathy, sharing of stories, and other techniques. I answered all questions and addressed all concerns to the best of my ability.  Review of Systems  Constitutional:  Positive for fatigue.  Respiratory:  Negative for cough and shortness of breath.   Gastrointestinal:  Positive for nausea (improved compared to admission). Negative for abdominal pain, diarrhea (None today) and vomiting (None today).    Objective:   Primary Diagnoses: Present on Admission:  Intractable nausea and vomiting  Hypokalemia  Hypoalbuminemia due to protein-calorie malnutrition (HCC)  COPD (chronic obstructive pulmonary disease) (HCC)  Chronic lymphocytic leukemia (HCC)  Generalized anxiety disorder  Depression   Squamous cell lung cancer, left (HCC)  Failure to thrive in adult  Unintentional weight loss  Prolonged QT interval  SVT (supraventricular tachycardia)  Sinus tachycardia   Physical Exam Vitals and nursing note reviewed.  Constitutional:      General: He is not in acute distress.    Appearance: He is ill-appearing.  HENT:     Head: Normocephalic and atraumatic.  Cardiovascular:     Rate and Rhythm: Normal rate.  Pulmonary:     Effort: Pulmonary effort is normal. No respiratory distress.  Abdominal:     General: Abdomen is flat.     Palpations: Abdomen is soft.  Skin:    General: Skin is warm and dry.  Neurological:     General: No focal deficit present.     Mental Status: He is alert.  Psychiatric:        Mood and Affect: Mood normal.        Behavior: Behavior normal.     Vital Signs:  BP 133/71 (BP Location: Left Arm)   Pulse 98   Temp 98.2 F (36.8 C)   Resp 15  Ht '6\' 1"'$  (1.854 m)   Wt 70.6 kg   SpO2 96%   BMI 20.53 kg/m   Palliative Assessment/Data: 50-60%    Advanced Care Planning:   Primary Decision Maker: PATIENT  Code Status/Advance Care Planning: Full code  A discussion was had today regarding advanced directives. Concepts specific to code status, artifical feeding and hydration, continued IV antibiotics and rehospitalization was had.  The difference between a aggressive medical intervention path and a palliative comfort care path for this patient at this time was had.   Decisions/Changes to ACP: None today  Assessment & Plan:   Impression: 71 year old male with history of CLL in remission/on surveillance now undergoing chemotherapy for left upper lobe squamous cell carcinoma with metastasis to bone.  He has undergone 1 chemotherapy around and since then has had intractable nausea and vomiting associated with poor intake, significant weight loss.  He has been started on Marinol.  Stopping of his nose with cotton tends to help.  Overall he  feels improved compared to admission.  Oncology is on board and we will make attempts to premedicate and prevent further episodes with further rounds with chemotherapy.  Overall long-term prognosis guarded  SUMMARY OF RECOMMENDATIONS   Remain full code Full scope of available treatments Continue Marinol to stimulate appetite Continue antiemetics which seem to be helping PMT will continue to follow  Symptom Management:  Per primary team PMT is available to assist as needed  Prognosis:  Unable to determine  Discharge Planning:  To Be Determined   Discussed with: Patient, patient's family, medical team, nursing    Thank you for allowing [place] to participate in the care of [place] PMT will continue to support holistically.  Time Total: 75 min  Greater than 50%  of this time was spent counseling and coordinating care related to the above assessment and plan.  Signed by: Walden Field, NP Palliative Medicine Team  Team Phone # [phone] (Nights/Weekends)  01/24/2022, 1:14 PM

## 2022-01-24 NOTE — Progress Notes (Signed)
Pt's wife at bedside, stated that the "pt had wet himself three times during the duration of the night and they want to hold the am dose of LORazepam (ATIVAN) tablet 0.5 mg" Pt denies any pain or discomfort at this time . Nursing staff provided assistance in changing bed linen, and pad. Will continue to  monitor. Pt able to make needs known. Bed put in lowest position, safety socks on. Will continue to monitor.

## 2022-01-25 ENCOUNTER — Encounter: Payer: Self-pay | Admitting: *Deleted

## 2022-01-25 LAB — CULTURE, BLOOD (ROUTINE X 2)
Culture: NO GROWTH
Culture: NO GROWTH
Special Requests: ADEQUATE
Special Requests: ADEQUATE

## 2022-01-25 NOTE — Progress Notes (Signed)
Dronabinol 5 mg tablets approved via telephone with [employer] through 01/25/2023.  [facility] notified of PA.  Contact number for plan is [phone]

## 2022-01-26 ENCOUNTER — Inpatient Hospital Stay (HOSPITAL_COMMUNITY)
Admission: EM | Admit: 2022-01-26 | Discharge: 2022-01-30 | DRG: 193 | Disposition: A | Discharge status: Home Health | Payer: Medicare Other | Attending: Internal Medicine | Admitting: Internal Medicine

## 2022-01-26 DIAGNOSIS — Z87891 Personal history of nicotine dependence: Secondary | ICD-10-CM | POA: Diagnosis not present

## 2022-01-26 DIAGNOSIS — K219 Gastro-esophageal reflux disease without esophagitis: Secondary | ICD-10-CM | POA: Diagnosis present

## 2022-01-26 DIAGNOSIS — Z28310 Unvaccinated for covid-19: Secondary | ICD-10-CM | POA: Diagnosis not present

## 2022-01-26 DIAGNOSIS — J9621 Acute and chronic respiratory failure with hypoxia: Secondary | ICD-10-CM | POA: Diagnosis not present

## 2022-01-26 DIAGNOSIS — G893 Neoplasm related pain (acute) (chronic): Secondary | ICD-10-CM | POA: Diagnosis present

## 2022-01-26 DIAGNOSIS — R634 Abnormal weight loss: Secondary | ICD-10-CM | POA: Diagnosis present

## 2022-01-26 DIAGNOSIS — T451X5A Adverse effect of antineoplastic and immunosuppressive drugs, initial encounter: Secondary | ICD-10-CM | POA: Diagnosis present

## 2022-01-26 DIAGNOSIS — Z79891 Long term (current) use of opiate analgesic: Secondary | ICD-10-CM

## 2022-01-26 DIAGNOSIS — Y95 Nosocomial condition: Secondary | ICD-10-CM | POA: Diagnosis present

## 2022-01-26 DIAGNOSIS — C9110 Chronic lymphocytic leukemia of B-cell type not having achieved remission: Secondary | ICD-10-CM | POA: Diagnosis not present

## 2022-01-26 DIAGNOSIS — M1990 Unspecified osteoarthritis, unspecified site: Secondary | ICD-10-CM | POA: Diagnosis present

## 2022-01-26 DIAGNOSIS — C7951 Secondary malignant neoplasm of bone: Secondary | ICD-10-CM | POA: Diagnosis present

## 2022-01-26 DIAGNOSIS — Z800 Family history of malignant neoplasm of digestive organs: Secondary | ICD-10-CM

## 2022-01-26 DIAGNOSIS — J440 Chronic obstructive pulmonary disease with acute lower respiratory infection: Secondary | ICD-10-CM | POA: Diagnosis present

## 2022-01-26 DIAGNOSIS — R059 Cough, unspecified: Secondary | ICD-10-CM | POA: Diagnosis not present

## 2022-01-26 DIAGNOSIS — E43 Unspecified severe protein-calorie malnutrition: Secondary | ICD-10-CM | POA: Diagnosis present

## 2022-01-26 DIAGNOSIS — J189 Pneumonia, unspecified organism: Secondary | ICD-10-CM | POA: Diagnosis not present

## 2022-01-26 DIAGNOSIS — F411 Generalized anxiety disorder: Secondary | ICD-10-CM | POA: Diagnosis not present

## 2022-01-26 DIAGNOSIS — E8809 Other disorders of plasma-protein metabolism, not elsewhere classified: Secondary | ICD-10-CM | POA: Diagnosis not present

## 2022-01-26 DIAGNOSIS — I2699 Other pulmonary embolism without acute cor pulmonale: Secondary | ICD-10-CM | POA: Diagnosis present

## 2022-01-26 DIAGNOSIS — E871 Hypo-osmolality and hyponatremia: Secondary | ICD-10-CM | POA: Diagnosis present

## 2022-01-26 DIAGNOSIS — I471 Supraventricular tachycardia, unspecified: Secondary | ICD-10-CM | POA: Diagnosis present

## 2022-01-26 DIAGNOSIS — D849 Immunodeficiency, unspecified: Secondary | ICD-10-CM | POA: Diagnosis present

## 2022-01-26 DIAGNOSIS — E785 Hyperlipidemia, unspecified: Secondary | ICD-10-CM | POA: Diagnosis present

## 2022-01-26 DIAGNOSIS — Z801 Family history of malignant neoplasm of trachea, bronchus and lung: Secondary | ICD-10-CM

## 2022-01-26 DIAGNOSIS — E876 Hypokalemia: Secondary | ICD-10-CM | POA: Diagnosis not present

## 2022-01-26 DIAGNOSIS — R432 Parageusia: Secondary | ICD-10-CM | POA: Diagnosis present

## 2022-01-26 DIAGNOSIS — I452 Bifascicular block: Secondary | ICD-10-CM | POA: Diagnosis present

## 2022-01-26 DIAGNOSIS — F32A Depression, unspecified: Secondary | ICD-10-CM | POA: Diagnosis present

## 2022-01-26 DIAGNOSIS — D801 Nonfamilial hypogammaglobulinemia: Secondary | ICD-10-CM | POA: Diagnosis present

## 2022-01-26 DIAGNOSIS — Z7952 Long term (current) use of systemic steroids: Secondary | ICD-10-CM

## 2022-01-26 DIAGNOSIS — R7989 Other specified abnormal findings of blood chemistry: Secondary | ICD-10-CM | POA: Diagnosis not present

## 2022-01-26 DIAGNOSIS — Z79899 Other long term (current) drug therapy: Secondary | ICD-10-CM

## 2022-01-26 DIAGNOSIS — Z7982 Long term (current) use of aspirin: Secondary | ICD-10-CM

## 2022-01-26 DIAGNOSIS — I351 Nonrheumatic aortic (valve) insufficiency: Secondary | ICD-10-CM | POA: Diagnosis present

## 2022-01-26 DIAGNOSIS — Z8249 Family history of ischemic heart disease and other diseases of the circulatory system: Secondary | ICD-10-CM

## 2022-01-26 DIAGNOSIS — C3492 Malignant neoplasm of unspecified part of left bronchus or lung: Secondary | ICD-10-CM | POA: Diagnosis present

## 2022-01-26 DIAGNOSIS — Z6821 Body mass index (BMI) 21.0-21.9, adult: Secondary | ICD-10-CM

## 2022-01-26 DIAGNOSIS — Z823 Family history of stroke: Secondary | ICD-10-CM

## 2022-01-26 DIAGNOSIS — Z7901 Long term (current) use of anticoagulants: Secondary | ICD-10-CM

## 2022-01-26 NOTE — ED Triage Notes (Addendum)
Pt was just discharge for complications related to first round of chemo tx. Has lung CA and leukemia. He had cough in hospital when discharged but has progressively gotten worse. RA sats 88-89%. Cough deep and productive. Pt placed on 2 L [place].

## 2022-01-27 ENCOUNTER — Emergency Department (HOSPITAL_COMMUNITY): Payer: Medicare Other

## 2022-01-27 ENCOUNTER — Encounter (HOSPITAL_COMMUNITY): Payer: Self-pay | Admitting: Emergency Medicine

## 2022-01-27 DIAGNOSIS — G893 Neoplasm related pain (acute) (chronic): Secondary | ICD-10-CM

## 2022-01-27 DIAGNOSIS — F411 Generalized anxiety disorder: Secondary | ICD-10-CM

## 2022-01-27 DIAGNOSIS — E8809 Other disorders of plasma-protein metabolism, not elsewhere classified: Secondary | ICD-10-CM

## 2022-01-27 DIAGNOSIS — J189 Pneumonia, unspecified organism: Secondary | ICD-10-CM

## 2022-01-27 DIAGNOSIS — Z8249 Family history of ischemic heart disease and other diseases of the circulatory system: Secondary | ICD-10-CM | POA: Diagnosis not present

## 2022-01-27 DIAGNOSIS — I2699 Other pulmonary embolism without acute cor pulmonale: Secondary | ICD-10-CM | POA: Diagnosis present

## 2022-01-27 DIAGNOSIS — J440 Chronic obstructive pulmonary disease with acute lower respiratory infection: Secondary | ICD-10-CM | POA: Diagnosis present

## 2022-01-27 DIAGNOSIS — F32A Depression, unspecified: Secondary | ICD-10-CM

## 2022-01-27 DIAGNOSIS — E43 Unspecified severe protein-calorie malnutrition: Secondary | ICD-10-CM | POA: Diagnosis present

## 2022-01-27 DIAGNOSIS — E876 Hypokalemia: Secondary | ICD-10-CM | POA: Diagnosis present

## 2022-01-27 DIAGNOSIS — I452 Bifascicular block: Secondary | ICD-10-CM | POA: Diagnosis present

## 2022-01-27 DIAGNOSIS — R059 Cough, unspecified: Secondary | ICD-10-CM | POA: Diagnosis not present

## 2022-01-27 DIAGNOSIS — E785 Hyperlipidemia, unspecified: Secondary | ICD-10-CM | POA: Diagnosis present

## 2022-01-27 DIAGNOSIS — Z87891 Personal history of nicotine dependence: Secondary | ICD-10-CM | POA: Diagnosis not present

## 2022-01-27 DIAGNOSIS — I351 Nonrheumatic aortic (valve) insufficiency: Secondary | ICD-10-CM | POA: Diagnosis present

## 2022-01-27 DIAGNOSIS — D849 Immunodeficiency, unspecified: Secondary | ICD-10-CM | POA: Diagnosis present

## 2022-01-27 DIAGNOSIS — J9621 Acute and chronic respiratory failure with hypoxia: Secondary | ICD-10-CM

## 2022-01-27 DIAGNOSIS — C3492 Malignant neoplasm of unspecified part of left bronchus or lung: Secondary | ICD-10-CM | POA: Diagnosis present

## 2022-01-27 DIAGNOSIS — Z28310 Unvaccinated for covid-19: Secondary | ICD-10-CM | POA: Diagnosis not present

## 2022-01-27 DIAGNOSIS — E871 Hypo-osmolality and hyponatremia: Secondary | ICD-10-CM | POA: Diagnosis present

## 2022-01-27 DIAGNOSIS — D801 Nonfamilial hypogammaglobulinemia: Secondary | ICD-10-CM | POA: Diagnosis present

## 2022-01-27 DIAGNOSIS — R7989 Other specified abnormal findings of blood chemistry: Secondary | ICD-10-CM | POA: Diagnosis not present

## 2022-01-27 DIAGNOSIS — C9110 Chronic lymphocytic leukemia of B-cell type not having achieved remission: Secondary | ICD-10-CM

## 2022-01-27 DIAGNOSIS — C7951 Secondary malignant neoplasm of bone: Secondary | ICD-10-CM | POA: Diagnosis present

## 2022-01-27 DIAGNOSIS — Y95 Nosocomial condition: Secondary | ICD-10-CM | POA: Diagnosis present

## 2022-01-27 DIAGNOSIS — I471 Supraventricular tachycardia, unspecified: Secondary | ICD-10-CM | POA: Diagnosis present

## 2022-01-27 LAB — COMPREHENSIVE METABOLIC PANEL
ALT: 21 U/L (ref 0–44)
AST: 15 U/L (ref 15–41)
Albumin: 2.4 g/dL — ABNORMAL LOW (ref 3.5–5.0)
Alkaline Phosphatase: 149 U/L — ABNORMAL HIGH (ref 38–126)
Anion gap: 9 (ref 5–15)
BUN: 10 mg/dL (ref 8–23)
CO2: 24 mmol/L (ref 22–32)
Calcium: 7.8 mg/dL — ABNORMAL LOW (ref 8.9–10.3)
Chloride: 101 mmol/L (ref 98–111)
Creatinine, Ser: 0.72 mg/dL (ref 0.61–1.24)
GFR, Estimated: >60 mL/min (ref >60)
Glucose, Bld: 127 mg/dL — ABNORMAL HIGH (ref 70–99)
Potassium: 3.2 mmol/L — ABNORMAL LOW (ref 3.5–5.1)
Sodium: 134 mmol/L — ABNORMAL LOW (ref 135–145)
Total Bilirubin: 0.5 mg/dL (ref 0.3–1.2)
Total Protein: 4.8 g/dL — ABNORMAL LOW (ref 6.5–8.1)

## 2022-01-27 LAB — PROTIME-INR
INR: 1.4 — ABNORMAL HIGH (ref 0.8–1.2)
Prothrombin Time: 16.7 seconds — ABNORMAL HIGH (ref 11.4–15.2)

## 2022-01-27 LAB — CBC WITH DIFFERENTIAL/PLATELET
Abs Immature Granulocytes: 2.2 10*3/uL — ABNORMAL HIGH (ref 0.00–0.07)
Abs Immature Granulocytes: 4.2 10*3/uL — ABNORMAL HIGH (ref 0.00–0.07)
Band Neutrophils: 7 %
Basophils Absolute: 0 10*3/uL (ref 0.0–0.1)
Basophils Absolute: 0 10*3/uL (ref 0.0–0.1)
Basophils Relative: 0 %
Basophils Relative: 0 %
Eosinophils Absolute: 0 10*3/uL (ref 0.0–0.5)
Eosinophils Absolute: 0 10*3/uL (ref 0.0–0.5)
Eosinophils Relative: 0 %
Eosinophils Relative: 0 %
HCT: 28.6 % — ABNORMAL LOW (ref 39.0–52.0)
HCT: 28.2 % — ABNORMAL LOW (ref 39.0–52.0)
Hemoglobin: 9.1 g/dL — ABNORMAL LOW (ref 13.0–17.0)
Hemoglobin: 8.8 g/dL — ABNORMAL LOW (ref 13.0–17.0)
Immature Granulocytes: 2 %
Lymphocytes Relative: 64 %
Lymphocytes Relative: 46 %
Lymphs Abs: 59.1 10*3/uL — ABNORMAL HIGH (ref 0.7–4.0)
Lymphs Abs: 38.5 10*3/uL — ABNORMAL HIGH (ref 0.7–4.0)
MCH: 28.6 pg (ref 26.0–34.0)
MCH: 28.3 pg (ref 26.0–34.0)
MCHC: 31.8 g/dL (ref 30.0–36.0)
MCHC: 31.2 g/dL (ref 30.0–36.0)
MCV: 89.9 fL (ref 80.0–100.0)
MCV: 90.7 fL (ref 80.0–100.0)
Metamyelocytes Relative: 4 %
Monocytes Absolute: 1.4 10*3/uL — ABNORMAL HIGH (ref 0.1–1.0)
Monocytes Absolute: 0 10*3/uL — ABNORMAL LOW (ref 0.1–1.0)
Monocytes Relative: 2 %
Monocytes Relative: 0 %
Myelocytes: 1 %
Neutro Abs: 29.5 10*3/uL — ABNORMAL HIGH (ref 1.7–7.7)
Neutro Abs: 41 10*3/uL — ABNORMAL HIGH (ref 1.7–7.7)
Neutrophils Relative %: 32 %
Neutrophils Relative %: 42 %
Platelets: 196 10*3/uL (ref 150–400)
Platelets: 165 10*3/uL (ref 150–400)
RBC: 3.18 MIL/uL — ABNORMAL LOW (ref 4.22–5.81)
RBC: 3.11 MIL/uL — ABNORMAL LOW (ref 4.22–5.81)
RDW: 17.6 % — ABNORMAL HIGH (ref 11.5–15.5)
RDW: 17.7 % — ABNORMAL HIGH (ref 11.5–15.5)
WBC: 92.1 10*3/uL (ref 4.0–10.5)
WBC: 83.7 10*3/uL (ref 4.0–10.5)
nRBC: 0 % (ref 0.0–0.2)
nRBC: 0 % (ref 0.0–0.2)

## 2022-01-27 LAB — URINALYSIS, ROUTINE W REFLEX MICROSCOPIC
Bacteria, UA: NONE SEEN
Bilirubin Urine: NEGATIVE
Glucose, UA: NEGATIVE mg/dL
Hgb urine dipstick: NEGATIVE
Ketones, ur: NEGATIVE mg/dL
Leukocytes,Ua: NEGATIVE
Nitrite: NEGATIVE
Protein, ur: 100 mg/dL — AB
Specific Gravity, Urine: 1.019 (ref 1.005–1.030)
pH: 6 (ref 5.0–8.0)

## 2022-01-27 LAB — MRSA NEXT GEN BY PCR, NASAL: MRSA by PCR Next Gen: NOT DETECTED

## 2022-01-27 LAB — APTT: aPTT: 41 seconds — ABNORMAL HIGH (ref 24–36)

## 2022-01-27 LAB — HIV ANTIBODY (ROUTINE TESTING W REFLEX): HIV Screen 4th Generation wRfx: NONREACTIVE

## 2022-01-27 LAB — D-DIMER, QUANTITATIVE: D-Dimer, Quant: 2.28 ug/mL-FEU — ABNORMAL HIGH (ref 0.00–0.50)

## 2022-01-27 LAB — LACTIC ACID, PLASMA: Lactic Acid, Venous: 0.9 mmol/L (ref 0.5–1.9)

## 2022-01-27 MED ORDER — ACETAMINOPHEN 325 MG PO TABS: 650.0000 mg | ORAL_TABLET | Freq: Four times a day (QID) | ORAL | Status: DC | PRN

## 2022-01-27 MED ORDER — POTASSIUM CHLORIDE IN NACL 40-0.9 MEQ/L-% IV SOLN: INTRAVENOUS | Status: DC

## 2022-01-27 MED ORDER — PIPERACILLIN-TAZOBACTAM 3.375 G IVPB
3.3750 g | Freq: Three times a day (TID) | INTRAVENOUS | Status: DC
  Administered 2022-01-27 – 2022-01-29 (×6): 3.375 g via INTRAVENOUS
  Filled 2022-01-27 (×5): qty 50

## 2022-01-27 MED ORDER — DRONABINOL 5 MG PO CAPS
5.0000 mg | ORAL_CAPSULE | Freq: Two times a day (BID) | ORAL | Status: DC
  Administered 2022-01-27 – 2022-01-29 (×6): 5 mg via ORAL
  Filled 2022-01-27 (×6): qty 1

## 2022-01-27 MED ORDER — ALBUTEROL SULFATE (2.5 MG/3ML) 0.083% IN NEBU: 2.5000 mg | INHALATION_SOLUTION | RESPIRATORY_TRACT | Status: DC | PRN

## 2022-01-27 MED ORDER — HYDROCODONE BIT-HOMATROP MBR 5-1.5 MG/5ML PO SOLN
5.0000 mL | Freq: Four times a day (QID) | ORAL | Status: DC | PRN
  Administered 2022-01-27 – 2022-01-30 (×9): 5 mL via ORAL
  Filled 2022-01-27 (×9): qty 5

## 2022-01-27 MED ORDER — BENZONATATE 100 MG PO CAPS
200.0000 mg | ORAL_CAPSULE | Freq: Once | ORAL | Status: AC
  Administered 2022-01-27: 200 mg via ORAL
  Filled 2022-01-27: qty 2

## 2022-01-27 MED ORDER — IPRATROPIUM-ALBUTEROL 0.5-2.5 (3) MG/3ML IN SOLN
3.0000 mL | Freq: Two times a day (BID) | RESPIRATORY_TRACT | Status: DC
  Administered 2022-01-27 – 2022-01-30 (×6): 3 mL via RESPIRATORY_TRACT
  Filled 2022-01-27 (×6): qty 3

## 2022-01-27 MED ORDER — LACTATED RINGERS IV SOLN: INTRAVENOUS | Status: DC

## 2022-01-27 MED ORDER — IMMUNE GLOBULIN (HUMAN) 10 GM/100ML IV SOLN
400.0000 mg/kg | Freq: Once | INTRAVENOUS | Status: AC
  Administered 2022-01-28: 30 g via INTRAVENOUS
  Filled 2022-01-27: qty 300

## 2022-01-27 MED ORDER — HEPARIN SODIUM (PORCINE) 5000 UNIT/ML IJ SOLN
5000.0000 [IU] | Freq: Three times a day (TID) | INTRAMUSCULAR | Status: DC
  Administered 2022-01-27 (×2): 5000 [IU] via SUBCUTANEOUS
  Filled 2022-01-27 (×2): qty 1

## 2022-01-27 MED ORDER — ENOXAPARIN SODIUM 80 MG/0.8ML IJ SOSY
75.0000 mg | PREFILLED_SYRINGE | Freq: Two times a day (BID) | INTRAMUSCULAR | Status: DC
  Administered 2022-01-27 – 2022-01-29 (×4): 75 mg via SUBCUTANEOUS
  Filled 2022-01-27 (×4): qty 0.8

## 2022-01-27 MED ORDER — ZOLPIDEM TARTRATE 5 MG PO TABS: 10.0000 mg | ORAL_TABLET | Freq: Every evening | ORAL | Status: DC | PRN

## 2022-01-27 MED ORDER — GUAIFENESIN ER 600 MG PO TB12
600.0000 mg | ORAL_TABLET | Freq: Two times a day (BID) | ORAL | Status: DC
  Administered 2022-01-27 – 2022-01-30 (×6): 600 mg via ORAL
  Filled 2022-01-27 (×6): qty 1

## 2022-01-27 MED ORDER — BUPROPION HCL ER (SR) 150 MG PO TB12
150.0000 mg | ORAL_TABLET | Freq: Every day | ORAL | Status: DC
  Administered 2022-01-27 – 2022-01-30 (×4): 150 mg via ORAL
  Filled 2022-01-27 (×4): qty 1

## 2022-01-27 MED ORDER — CELECOXIB 100 MG PO CAPS: 100.0000 mg | ORAL_CAPSULE | Freq: Two times a day (BID) | ORAL | Status: DC | PRN

## 2022-01-27 MED ORDER — METOPROLOL TARTRATE 25 MG PO TABS
25.0000 mg | ORAL_TABLET | Freq: Two times a day (BID) | ORAL | Status: DC
  Administered 2022-01-27 – 2022-01-30 (×6): 25 mg via ORAL
  Filled 2022-01-27 (×7): qty 1

## 2022-01-27 MED ORDER — PRAVASTATIN SODIUM 10 MG PO TABS
20.0000 mg | ORAL_TABLET | Freq: Every day | ORAL | Status: DC
  Administered 2022-01-27 – 2022-01-29 (×3): 20 mg via ORAL
  Filled 2022-01-27 (×3): qty 2

## 2022-01-27 MED ORDER — LORAZEPAM 0.5 MG PO TABS: 0.5000 mg | ORAL_TABLET | Freq: Every evening | ORAL | Status: DC | PRN

## 2022-01-27 MED ORDER — IPRATROPIUM-ALBUTEROL 0.5-2.5 (3) MG/3ML IN SOLN
3.0000 mL | Freq: Once | RESPIRATORY_TRACT | Status: AC
  Administered 2022-01-27: 3 mL via RESPIRATORY_TRACT
  Filled 2022-01-27: qty 3

## 2022-01-27 MED ORDER — SODIUM CHLORIDE 0.9 % IV SOLN
500.0000 mg | INTRAVENOUS | Status: DC
  Administered 2022-01-28 – 2022-01-30 (×3): 500 mg via INTRAVENOUS
  Filled 2022-01-27 (×3): qty 5

## 2022-01-27 MED ORDER — POTASSIUM CHLORIDE CRYS ER 20 MEQ PO TBCR
40.0000 meq | EXTENDED_RELEASE_TABLET | Freq: Once | ORAL | Status: AC
  Administered 2022-01-27: 40 meq via ORAL
  Filled 2022-01-27: qty 2

## 2022-01-27 MED ORDER — ASPIRIN 325 MG PO TABS
325.0000 mg | ORAL_TABLET | Freq: Every day | ORAL | Status: DC
  Administered 2022-01-28 – 2022-01-29 (×2): 325 mg via ORAL
  Filled 2022-01-27 (×4): qty 1

## 2022-01-27 MED ORDER — POTASSIUM CHLORIDE 10 MEQ/100ML IV SOLN
10.0000 meq | Freq: Once | INTRAVENOUS | Status: AC
  Administered 2022-01-27: 10 meq via INTRAVENOUS
  Filled 2022-01-27: qty 100

## 2022-01-27 MED ORDER — POLYETHYLENE GLYCOL 3350 17 G PO PACK
17.0000 g | PACK | Freq: Every day | ORAL | Status: DC
  Administered 2022-01-27 – 2022-01-29 (×3): 17 g via ORAL
  Filled 2022-01-27 (×4): qty 1

## 2022-01-27 MED ORDER — IOHEXOL 350 MG/ML SOLN
100.0000 mL | Freq: Once | INTRAVENOUS | Status: AC | PRN
  Administered 2022-01-27: 100 mL via INTRAVENOUS

## 2022-01-27 MED ORDER — OXYCODONE HCL ER 20 MG PO T12A
20.0000 mg | EXTENDED_RELEASE_TABLET | Freq: Two times a day (BID) | ORAL | Status: DC
  Administered 2022-01-27 – 2022-01-30 (×7): 20 mg via ORAL
  Filled 2022-01-27 (×7): qty 1

## 2022-01-27 MED ORDER — IPRATROPIUM-ALBUTEROL 0.5-2.5 (3) MG/3ML IN SOLN
3.0000 mL | Freq: Four times a day (QID) | RESPIRATORY_TRACT | Status: DC
  Administered 2022-01-27: 3 mL via RESPIRATORY_TRACT
  Filled 2022-01-27: qty 3

## 2022-01-27 MED ORDER — LOPERAMIDE HCL 2 MG PO CAPS: 2.0000 mg | ORAL_CAPSULE | Freq: Four times a day (QID) | ORAL | Status: DC | PRN

## 2022-01-27 MED ORDER — SODIUM CHLORIDE 0.9 % IV SOLN: 2.0000 g | INTRAVENOUS | Status: DC

## 2022-01-27 MED ORDER — ENSURE ENLIVE PO LIQD
237.0000 mL | Freq: Two times a day (BID) | ORAL | Status: DC
  Administered 2022-01-29 – 2022-01-30 (×2): 237 mL via ORAL

## 2022-01-27 MED ORDER — SODIUM CHLORIDE 0.9 % IV SOLN
2.0000 g | Freq: Once | INTRAVENOUS | Status: AC
  Administered 2022-01-27: 2 g via INTRAVENOUS
  Filled 2022-01-27: qty 20

## 2022-01-27 MED ORDER — SODIUM CHLORIDE 0.9 % IV SOLN
500.0000 mg | Freq: Once | INTRAVENOUS | Status: AC
  Administered 2022-01-27: 500 mg via INTRAVENOUS
  Filled 2022-01-27: qty 5

## 2022-01-27 MED ORDER — MORPHINE SULFATE (PF) 2 MG/ML IV SOLN: 2.0000 mg | INTRAVENOUS | Status: DC | PRN

## 2022-01-27 NOTE — H&P (Signed)
History and Physical    Patient: Alfred Brown TWS:[id number] DOB: 12/04/1950 DOA: 01/26/2022 DOS: the patient was seen and examined on 01/27/2022 PCP: Redmond School, MD  Patient coming from: Home  Chief Complaint:  Chief Complaint  Patient presents with   Shortness of Breath   HPI: Alfred Brown is a 71 y.o. male with medical history significant of CLL, COPD, lung cancer, GERD, hyperlipidemia, right bundle branch block, and more presents the ED with a chief complaint of cough and dyspnea.  Patient was discharged from the hospital approximately 3 days ago.  At that time he presented for generalized weakness and nausea.  This was after his first chemotherapy for lung cancer.  That chemo happened on 10/18.  Patient had had a couple of watery stools, which were treated with Imodium.  He took nausea medication at home but it was ineffective and patient became disoriented so his wife decided it was time for them to present to the ER.  Patient was admitted for intractable nausea and vomiting he was started on Marinol which she does think helped.  He had electrolyte abnormalities at that time as would be expected with diarrhea and poor p.o. intake.  He also had SVT with possible A-fib during that time.  He was continued on Lopressor.  He had an echocardiogram during that stay which showed preserved ejection fraction and no wall motion abnormality.  Palliative was consulted for goals of care discussion.  At this time patient is still full code.  Today, patient reports that his cough has been getting worse over the last 3 days.  He reports it is nonproductive.  The cough is worsening and that it is more violent.  He has felt feverish and chilled at home.  Tmax at home 100.6.  He denies any chest pain or palpitations.  He has had worsening shortness of breath.  The dyspnea is worse on exertion.  He also has orthopnea and has been sleeping in a recliner.  Patient denies any peripheral edema.  He is continued  to have nausea and decreased appetite but he reports he is eating more than he was prior to the last hospitalization.  He had some loose stools, and feels like he is going to have another at the time of my exam.  He reports not pain in his abdomen but movement.  Patient reports he has inhalers at home.  He takes them for his shortness of breath and they only offer temporary relief.  They do not help with the cough at all.  Patient does not wear oxygen at home per his report.  Patient has no other complaints at this time.  Patient does not smoke, does not drink, does not use illicit drugs.  He is not vaccinated for COVID.  Patient is full code.  He does report that he would want to only have an attempt to be brought back once.  He reports that wife would be Marine scientist. Review of Systems: As mentioned in the history of present illness. All other systems reviewed and are negative. Past Medical History:  Diagnosis Date   Aortic valve disorder    Mild insufficiency   Chronic lymphocytic leukemia ([place])    01/2012: WBC-90.2, H&H-15.1/45.3, platelets-185 03/31/12: Verified with Dr. Tressie Stalker that no precautions or modification of medical regime are required prior to orthopaedic surgery.    CLL (chronic lymphocytic leukemia) (HCC)    CMV (cytomegalovirus) (HCC)    COPD (chronic obstructive pulmonary disease) (HCC)    Depression  with anxiety    DJD (degenerative joint disease)    GERD (gastroesophageal reflux disease)    Hyperlipidemia    Hypogammaglobulinemia ([place]) 09/23/2019   Lipoma    left chest wall   Palpitations 2004   PVCs; borderline stress nuclear in 2004, negative in 2007; Echo 2007; AV-sclerotic, very mild AI   Pneumonia    Right bundle branch block    + left posterior fascicular block   Tobacco abuse    80 pack years   Past Surgical History:  Procedure Laterality Date   COLONOSCOPY  01/31/2012   Negative screening study   GANGLION CYST EXCISION  04/02/1995   left wrist   IR  IMAGING GUIDED PORT INSERTION  01/14/2022   LIPOMA EXCISION Left 10/11/2021   chest wall   ROTATOR CUFF REPAIR  04/02/1995   right   SHOULDER ARTHROSCOPY WITH SUBACROMIAL DECOMPRESSION  04/09/2012   Procedure: SHOULDER ARTHROSCOPY WITH SUBACROMIAL DECOMPRESSION;  Surgeon: Ninetta Lights, MD;  Location: [facility];  Service: Orthopedics;  Laterality: Left;  LEFT SHOULDER ARTHROSCOPY, SUBACROMIAL DECOMPRESSION, PARTIAL ACROMIOPLASTY WITH CORACOACROMIAL RELEASE, DISTAL CLAVICULECTOMY WITH ROTATOR CUFF REPAIR, DEBRIDEMENT OF LABRUM   Social History:  reports that he has quit smoking. His smoking use included cigarettes. He started smoking about 58 years ago. He has a 45.00 pack-year smoking history. He has never used smokeless tobacco. He reports that he does not drink alcohol and does not use drugs.  No Known Allergies  Family History  Problem Relation Age of Onset   Stroke Mother    Heart attack Father    Cancer Sister        colon   Colon cancer Sister    Cancer Brother        2 brothers died with lung cancer    Prior to Admission medications   Medication Sig Start Date End Date Taking? Authorizing Provider  acetaminophen (TYLENOL) 500 MG tablet Take 500-1,000 mg by mouth every 6 (six) hours as needed for moderate pain.    [provider]  albuterol (VENTOLIN HFA) 108 (90 Base) MCG/ACT inhaler 2 puffs every 4 hours as needed if you can't catch your breath 12/04/21   Tanda Rockers, MD  aspirin 325 MG tablet Take 325 mg by mouth daily.    [provider]  benzonatate (TESSALON) 100 MG capsule Take 1 capsule (100 mg total) by mouth 3 (three) times daily as needed for cough. 01/24/22   Florencia Reasons, MD  buPROPion [facility] SR) 150 MG 12 hr tablet Take 150 mg by mouth daily.    [provider]  Calcium Carb-Cholecalciferol (CALCIUM 600 + D PO) Take 1 tablet by mouth daily.    [provider]  CARBOPLATIN IV Inject into the vein every 21 (  twenty-one) days. 01/16/22   [provider]  celecoxib (CELEBREX) 100 MG capsule Take 1 capsule (100 mg total) by mouth 2 (two) times daily as needed for moderate pain. 01/24/22   Florencia Reasons, MD  clotrimazole-betamethasone (LOTRISONE) cream Apply 1 application  topically daily as needed (rash). 06/19/15   [provider]  dexamethasone (DECADRON) 2 MG tablet Take 1 tablet (2 mg total) by mouth daily. 01/24/22   Florencia Reasons, MD  dextromethorphan (DELSYM) 30 MG/5ML liquid Take 5 mLs (30 mg total) by mouth at bedtime as needed for cough. 01/24/22   Florencia Reasons, MD  diltiazem (CARDIZEM) 30 MG tablet Take 30 mg by mouth as needed.    [provider]  dronabinol (MARINOL) 5 MG capsule Take 1 capsule (5 mg total) by mouth 2 (two) times daily before lunch and supper. 01/25/22   Florencia Reasons, MD  fish oil-omega-3 fatty acids 1000 MG capsule Take 1 g by mouth daily.    [provider]  fluticasone (FLONASE) 50 MCG/ACT nasal spray Place 2 sprays into the nose daily.    [provider]  guaiFENesin (MUCINEX) 600 MG 12 hr tablet Take 1 tablet (600 mg total) by mouth 2 (two) times daily. 01/24/22   Florencia Reasons, MD  lidocaine (LIDODERM) 5 % Place 1 patch onto the skin daily. Remove and Discard patch within 12 hours. Patient taking differently: Place 1 patch onto the skin daily as needed (pain). Remove and Discard patch within 12 hours. 11/28/21   Derek Jack, MD  lidocaine-prilocaine (EMLA) cream Apply a small amount to port a cath site and cover with plastic wrap 1 hour prior to infusion appointments 01/15/22   Derek Jack, MD  loperamide (IMODIUM) 2 MG capsule Take 1 capsule (2 mg total) by mouth every 6 (six) hours as needed for diarrhea or loose stools. 01/24/22   Florencia Reasons, MD  Loratadine 10 MG CAPS Take 10 mg by mouth daily.    [provider]  LORazepam (ATIVAN) 0.5 MG tablet Take 1 tablet (0.5 mg total) by mouth at bedtime as needed for anxiety. 01/24/22    Florencia Reasons, MD  lovastatin (MEVACOR) 20 MG tablet Take 20 mg by mouth daily. 09/29/14   [provider]  metoprolol tartrate (LOPRESSOR) 25 MG tablet Take 1 tablet (25 mg total) by mouth 2 (two) times daily. Patient taking differently: Take 37.5 mg by mouth 2 (two) times daily. 12/20/21   Orson Eva, MD  Multiple Vitamin (MULTIVITAMIN) tablet Take 1 tablet by mouth daily.    [provider]  niacin (NIASPAN) 500 MG CR tablet Take 500 mg by mouth daily. 09/29/14   [provider]  oxyCODONE (OXYCONTIN) 20 mg 12 hr tablet Take 1 tablet (20 mg total) by mouth every 12 (twelve) hours. 01/07/22   Derek Jack, MD  Oxycodone HCl 10 MG TABS Take 1 tablet (10 mg total) by mouth every 4 (four) hours as needed. 12/26/21   Derek Jack, MD  PACLITAXEL IV Inject into the vein every 21 ( twenty-one) days. 01/16/22   [provider]  PEMBROLIZUMAB IV Inject into the vein every 21 ( twenty-one) days. 01/16/22   [provider]  polyethylene glycol (MIRALAX / GLYCOLAX) 17 g packet Take 17 g by mouth daily as needed for moderate constipation.    [provider]  prochlorperazine (COMPAZINE) 10 MG tablet Take 1 tablet (10 mg total) by mouth every 6 (six) hours as needed for nausea or vomiting. 01/15/22   Derek Jack, MD  prochlorperazine (COMPAZINE) 5 MG tablet Take 1 tablet (5 mg total) by mouth every 6 (six) hours as needed for nausea or vomiting. Patient not taking: Reported on 01/20/2022 12/20/21   Orson Eva, MD  valACYclovir (VALTREX) 500 MG tablet Take 500 mg by mouth daily.    [provider]  zolpidem (AMBIEN) 10 MG tablet Take 10 mg by mouth at bedtime as needed for sleep. 12/24/21   [provider]    Physical Exam: Vitals:   01/27/22 0335 01/27/22 0400 01/27/22 0418 01/27/22 0430  BP: 133/71 118/69  123/69  Pulse: 95 92  95  Resp: (!) 26 (!) 26  (!) 24  Temp:   100.3 F (37.9 C)  TempSrc:   Oral   SpO2:  95% 96%  96%   1.  General: Patient lying supine in bed,  no acute distress   2. Psychiatric: Alert and oriented x 3, mood and behavior normal for situation, pleasant and cooperative with exam   3. Neurologic: Speech and language are normal, face is symmetric, moves all 4 extremities voluntarily, at baseline without acute deficits on limited exam   4. HEENMT:  Head is atraumatic, normocephalic, pupils reactive to light, neck is supple, trachea is midline, mucous membranes are moist   5. Respiratory : Coarse wheezing heard bilaterally but more on the left, maintaining oxygen sats with nasal cannula, no increased work of breathing, speaking in full sentences   6. Cardiovascular : Heart rate normal, rhythm is irregular with PVCs, no murmurs, rubs or gallops, no peripheral edema, peripheral pulses palpated   7. Gastrointestinal:  Abdomen is soft, nondistended, nontender to palpation bowel sounds active, no masses or organomegaly palpated   8. Skin:  Skin is warm, dry and intact without rashes, acute lesions, or ulcers on limited exam   9.Musculoskeletal:  No acute deformities or trauma, no asymmetry in tone, no peripheral edema, peripheral pulses palpated, no tenderness to palpation in the extremities  Data Reviewed: In the ED Temp 97.8-100.3, heart rate 85-95, respiratory rate 20-26, blood pressure 114/61-133/71 Leukocytosis at 92.1, hemoglobin 91, platelets 196 Slight hyponatremia at 133, hypokalemia 2.9 Alk phos elevated 163 Albumin low at 2.5 D-dimer 2.28 UA is not indicative of UTI Blood culture pending CTA could not completely rule out PE due to opacification of the left upper lobe but it did note bronchial thickening most likely infectious lymphadenopathy in metastatic disease to the bone Patient was started on Zithromax, Rocephin, and given Tessalon Perles Patient was given DuoNeb, a total of 50 mEq of potassium and started on LR Assessment and Plan: * Pneumonia -  Increased cough, fever, dyspnea - Leukocytosis is difficult to interpret given his leukemia, but white blood cell count is 92.1 - Patient also recently had a colony-stimulating factor on 1020 - Patient was started on Rocephin and Zithromax - Blood cultures pending - Expectorated sputum culture pending - Pneumonia noted on chest x-ray, infectious bronchial thickening noted on CTA - Continue to monitor  Hypocalcemia Corrects for albumin - Continue to monitor  Depression Continue Wellbutrin  Hypoalbuminemia - Poor p.o. intake is a large contributor - Supplement meals with Ensure shakes  Hypokalemia - 50 mEq of potassium given in the ED - Continue normal saline with 40 mEq of potassium throughout the night - Trend in the a.m. - Secondary to poor p.o. intake although Marinol is helping his appetite  Generalized anxiety disorder - Continue benzo   Cancer associated pain - Continue home getting pain meds - Morphine for breakthrough pain - Continue to monitor  Acute and chronic respiratory failure with hypoxia (HCC) - Oxygen sats down to 88% - Normalized with 2 L nasal cannula - Likely multifactorial with lung cancer and pneumonia both contributing - PE cannot be completely ruled out due to opacification of the left upper lobe, but is thought to be unlikely at this point - D-dimer was positive at 2.28> nonspecific finding - EKG pending - No chest pain - Wean off O2 as tolerated - DuoNeb scheduled - Albuterol as needed - Continue antibiotics for pneumonia - Continue to monitor  Chronic lymphocytic leukemia ([place]) - White blood cell count 92 - Has been higher - Patient follows with Dr. Raliegh Ip -Inpatient consult  to oncology - Continue to monitor  Hyperlipidemia - Continue statin      Advance Care Planning:   Code Status: Prior full  Consults: Oncology  Family Communication: wife at bedside  Severity of Illness: The appropriate patient status for this patient is  INPATIENT. Inpatient status is judged to be reasonable and necessary in order to provide the required intensity of service to ensure the patient's safety. The patient's presenting symptoms, physical exam findings, and initial radiographic and laboratory data in the context of their chronic comorbidities is felt to place them at high risk for further clinical deterioration. Furthermore, it is not anticipated that the patient will be medically stable for discharge from the hospital within 2 midnights of admission.   * I certify that at the point of admission it is my clinical judgment that the patient will require inpatient hospital care spanning beyond 2 midnights from the point of admission due to high intensity of service, high risk for further deterioration and high frequency of surveillance required.*  Author: Rolla Plate, DO 01/27/2022 5:18 AM  For on call review [email or web address].

## 2022-01-27 NOTE — Assessment & Plan Note (Signed)
-   50 mEq of potassium given in the ED - Continue normal saline with 40 mEq of potassium throughout the night - Trend in the a.m. - Secondary to poor p.o. intake although Marinol is helping his appetite

## 2022-01-27 NOTE — Progress Notes (Signed)
Patient admitted to the hospital earlier this morning by Dr. Clearence Ped  Patient seen and examined.  He says he does not feel well.  His wife says that his cough is becoming more productive.  P.o. intake does remain poor.  Assessment/plan:  Healthcare associated pneumonia -Admitted with cough, fever and shortness of breath -He is immunocompromised from recent chemotherapy -Recently discharged from the hospital on 10/26 after being in the hospital for 5 days -We will continue with broad-spectrum antibiotics -Checking urinary antigens, could likely discontinue azithromycin if Legionella antigen is negative -Since he does have cavitating lesion, will broaden coverage to Zosyn to cover anaerobes -Check MRSA PCR -Sputum cultures also been ordered -With his history of CLL and being immunocompromise, case was discussed with oncology was recommended 1 dose of IVIG, 400 mg/kg -Continue pulmonary hygiene  Possible pulmonary embolism -CT angiogram of the chest could not rule out left upper lobe PE.  It was noted that there may be external compression of left upper lobe pulmonary artery by underlying malignancy, although concurrent pulmonary embolism cannot be ruled out -Reviewed with radiologist and VQ scan would not add any additional information -Reviewed with oncology and with patient's underlying risk factors including active malignancy, will choose to treat with therapeutic dose Lovenox   Chronic lymphocytic leukemia -White blood cell count 92,000 on admission -He also recently received G-CSF with his chemo treatment -Continue to monitor  Squamous cell lung cancer, stage IV, metastatic to bones -Recently completed radiation therapy to his left ribs -He recently completed his first chemo treatment approximately a week ago -CTA chest done on this admission did seem to show progression of underlying cancer, although after discussing with oncology, likely too early to judge any progression of  disease since he is only had 1 treatment  Poor p.o. intake -Overall, he feels that dysgeusia is better after his last admission, although his p.o. intake still remains poor -We will have nutrition evaluate  Hyperlipidemia -He is on statin  Hypokalemia -Replace

## 2022-01-27 NOTE — Progress Notes (Signed)
ANTICOAGULATION CONSULT NOTE - Initial Consult  Pharmacy Consult for lovenox Indication: pulmonary embolus  No Known Allergies  Patient Measurements: Height: 6\' 1"  (185.4 cm) Weight: 73.8 kg (162 lb 11.2 oz) IBW/kg (Calculated) : 79.9 Heparin Dosing Weight:   Vital Signs: Temp: 98.5 F (36.9 C) (10/29 1655) Temp Source: Oral (10/29 1655) BP: 114/69 (10/29 1655) Pulse Rate: 89 (10/29 1655)  Labs: Recent Labs    01/27/22 0038 01/27/22 0046 01/27/22 0604  HGB 9.1*  --  8.8*  HCT 28.6*  --  28.2*  PLT 196  --  165  APTT  --  41*  --   LABPROT  --  16.7*  --   INR  --  1.4*  --   CREATININE 0.79  --  0.72    Estimated Creatinine Clearance: 89.7 mL/min (by C-G formula based on SCr of 0.72 mg/dL).   Medical History: Past Medical History:  Diagnosis Date   Aortic valve disorder    Mild insufficiency   Chronic lymphocytic leukemia ([place])    01/2012: WBC-90.2, H&H-15.1/45.3, platelets-185 03/31/12: Verified with Dr. Tressie Stalker that no precautions or modification of medical regime are required prior to orthopaedic surgery.    CLL (chronic lymphocytic leukemia) (HCC)    CMV (cytomegalovirus) (HCC)    COPD (chronic obstructive pulmonary disease) (HCC)    Depression with anxiety    DJD (degenerative joint disease)    GERD (gastroesophageal reflux disease)    Hyperlipidemia    Hypogammaglobulinemia ([place]) 09/23/2019   Lipoma    left chest wall   Palpitations 2004   PVCs; borderline stress nuclear in 2004, negative in 2007; Echo 2007; AV-sclerotic, very mild AI   Pneumonia    Right bundle branch block    + left posterior fascicular block   Tobacco abuse    80 pack years    Medications:  Medications Prior to Admission  Medication Sig Dispense Refill Last Dose   acetaminophen (TYLENOL) 500 MG tablet Take 500-1,000 mg by mouth every 6 (six) hours as needed for moderate pain.      albuterol (VENTOLIN HFA) 108 (90 Base) MCG/ACT inhaler 2 puffs every 4 hours as needed if you  can't catch your breath 18 g 11    aspirin 325 MG tablet Take 325 mg by mouth daily.      benzonatate (TESSALON) 100 MG capsule Take 1 capsule (100 mg total) by mouth 3 (three) times daily as needed for cough. 20 capsule 0    buPROPion (WELLBUTRIN SR) 150 MG 12 hr tablet Take 150 mg by mouth daily.      Calcium Carb-Cholecalciferol (CALCIUM 600 + D PO) Take 1 tablet by mouth daily.      CARBOPLATIN IV Inject into the vein every 21 ( twenty-one) days.      celecoxib (CELEBREX) 100 MG capsule Take 1 capsule (100 mg total) by mouth 2 (two) times daily as needed for moderate pain.      clotrimazole-betamethasone (LOTRISONE) cream Apply 1 application  topically daily as needed (rash).      dexamethasone (DECADRON) 2 MG tablet Take 1 tablet (2 mg total) by mouth daily. 7 tablet 0    dextromethorphan (DELSYM) 30 MG/5ML liquid Take 5 mLs (30 mg total) by mouth at bedtime as needed for cough. 89 mL 0    diltiazem (CARDIZEM) 30 MG tablet Take 30 mg by mouth as needed.      dronabinol (MARINOL) 5 MG capsule Take 1 capsule (5 mg total) by mouth 2 (two) times  daily before lunch and supper. 60 capsule 0    fish oil-omega-3 fatty acids 1000 MG capsule Take 1 g by mouth daily.      fluticasone (FLONASE) 50 MCG/ACT nasal spray Place 2 sprays into the nose daily.      guaiFENesin (MUCINEX) 600 MG 12 hr tablet Take 1 tablet (600 mg total) by mouth 2 (two) times daily.      lidocaine (LIDODERM) 5 % Place 1 patch onto the skin daily. Remove and Discard patch within 12 hours. (Patient taking differently: Place 1 patch onto the skin daily as needed (pain). Remove and Discard patch within 12 hours.) 30 patch 2    lidocaine-prilocaine (EMLA) cream Apply a small amount to port a cath site and cover with plastic wrap 1 hour prior to infusion appointments 30 g 3    loperamide (IMODIUM) 2 MG capsule Take 1 capsule (2 mg total) by mouth every 6 (six) hours as needed for diarrhea or loose stools. 30 capsule 0    Loratadine 10 MG  CAPS Take 10 mg by mouth daily.      LORazepam (ATIVAN) 0.5 MG tablet Take 1 tablet (0.5 mg total) by mouth at bedtime as needed for anxiety. 15 tablet 0    lovastatin (MEVACOR) 20 MG tablet Take 20 mg by mouth daily.      metoprolol tartrate (LOPRESSOR) 25 MG tablet Take 1 tablet (25 mg total) by mouth 2 (two) times daily. (Patient taking differently: Take 37.5 mg by mouth 2 (two) times daily.) 60 tablet 1    Multiple Vitamin (MULTIVITAMIN) tablet Take 1 tablet by mouth daily.      niacin (NIASPAN) 500 MG CR tablet Take 500 mg by mouth daily.      oxyCODONE (OXYCONTIN) 20 mg 12 hr tablet Take 1 tablet (20 mg total) by mouth every 12 (twelve) hours. 30 tablet 0    Oxycodone HCl 10 MG TABS Take 1 tablet (10 mg total) by mouth every 4 (four) hours as needed. 180 tablet 0    PACLITAXEL IV Inject into the vein every 21 ( twenty-one) days.      PEMBROLIZUMAB IV Inject into the vein every 21 ( twenty-one) days.      polyethylene glycol (MIRALAX / GLYCOLAX) 17 g packet Take 17 g by mouth daily as needed for moderate constipation.      prochlorperazine (COMPAZINE) 10 MG tablet Take 1 tablet (10 mg total) by mouth every 6 (six) hours as needed for nausea or vomiting. 60 tablet 3    prochlorperazine (COMPAZINE) 5 MG tablet Take 1 tablet (5 mg total) by mouth every 6 (six) hours as needed for nausea or vomiting. (Patient not taking: Reported on 01/20/2022) 12 tablet 0    valACYclovir (VALTREX) 500 MG tablet Take 500 mg by mouth daily.      zolpidem (AMBIEN) 10 MG tablet Take 10 mg by mouth at bedtime as needed for sleep.       Assessment: Pharmacy consulted to dose lovenox in patient with possible pulmonary embolism as shown by CT angio.  Goal of Therapy:   Monitor platelets by anticoagulation protocol: Yes   Plan:  Lovenox 75 mg subq every 12 hours. Monitor H&H and s/s of bleeding.  Revonda Standard Dahir Ayer 01/27/2022,7:49 PM

## 2022-01-27 NOTE — Assessment & Plan Note (Signed)
Corrects for albumin - Continue to monitor

## 2022-01-27 NOTE — Assessment & Plan Note (Signed)
Continue statin. 

## 2022-01-27 NOTE — ED Provider Notes (Signed)
[facility] EMERGENCY DEPARTMENT Provider Note   CSN: [id number] Arrival date & time: 01/26/22  2324     History  Chief Complaint  Patient presents with   Shortness of [place] is a 71 y.o. male.  The history is provided by the patient.  Shortness of Breath He has history of hyperlipidemia, chronic lymphocytic leukemia, COPD, lung cancer and comes in because of worsening cough and shortness of breath.  He had been hospitalized for dehydration last week.  He had his first chemotherapy infusion slightly more than a week ago.  Today, cough has been nonproductive.  He had temperature of 100.6 last night with some chills and sweats.  He did receive a dose of acetaminophen tonight, but has not had any additional fever.  He denies any chest pain, heaviness, tightness, pressure.   Home Medications Prior to Admission medications   Medication Sig Start Date End Date Taking? Authorizing Provider  acetaminophen (TYLENOL) 500 MG tablet Take 500-1,000 mg by mouth every 6 (six) hours as needed for moderate pain.    [provider]  albuterol (VENTOLIN HFA) 108 (90 Base) MCG/ACT inhaler 2 puffs every 4 hours as needed if you can't catch your breath 12/04/21   Tanda Rockers, MD  aspirin 325 MG tablet Take 325 mg by mouth daily.    [provider]  benzonatate (TESSALON) 100 MG capsule Take 1 capsule (100 mg total) by mouth 3 (three) times daily as needed for cough. 01/24/22   Florencia Reasons, MD  buPROPion [facility] SR) 150 MG 12 hr tablet Take 150 mg by mouth daily.    [provider]  Calcium Carb-Cholecalciferol (CALCIUM 600 + D PO) Take 1 tablet by mouth daily.    [provider]  CARBOPLATIN IV Inject into the vein every 21 ( twenty-one) days. 01/16/22   [provider]  celecoxib (CELEBREX) 100 MG capsule Take 1 capsule (100 mg total) by mouth 2 (two) times daily as needed for moderate pain. 01/24/22   Florencia Reasons, MD   clotrimazole-betamethasone (LOTRISONE) cream Apply 1 application  topically daily as needed (rash). 06/19/15   [provider]  dexamethasone (DECADRON) 2 MG tablet Take 1 tablet (2 mg total) by mouth daily. 01/24/22   Florencia Reasons, MD  dextromethorphan (DELSYM) 30 MG/5ML liquid Take 5 mLs (30 mg total) by mouth at bedtime as needed for cough. 01/24/22   Florencia Reasons, MD  diltiazem (CARDIZEM) 30 MG tablet Take 30 mg by mouth as needed.    [provider]  dronabinol (MARINOL) 5 MG capsule Take 1 capsule (5 mg total) by mouth 2 (two) times daily before lunch and supper. 01/25/22   Florencia Reasons, MD  fish oil-omega-3 fatty acids 1000 MG capsule Take 1 g by mouth daily.    [provider]  fluticasone (FLONASE) 50 MCG/ACT nasal spray Place 2 sprays into the nose daily.    [provider]  guaiFENesin (MUCINEX) 600 MG 12 hr tablet Take 1 tablet (600 mg total) by mouth 2 (two) times daily. 01/24/22   Florencia Reasons, MD  lidocaine (LIDODERM) 5 % Place 1 patch onto the skin daily. Remove and Discard patch within 12 hours. Patient taking differently: Place 1 patch onto the skin daily as needed (pain). Remove and Discard patch within 12 hours. 11/28/21   Derek Jack, MD  lidocaine-prilocaine (EMLA) cream Apply a small amount to port a cath site and cover with plastic wrap 1 hour prior to infusion  appointments 01/15/22   Derek Jack, MD  loperamide (IMODIUM) 2 MG capsule Take 1 capsule (2 mg total) by mouth every 6 (six) hours as needed for diarrhea or loose stools. 01/24/22   Florencia Reasons, MD  Loratadine 10 MG CAPS Take 10 mg by mouth daily.    [provider]  LORazepam (ATIVAN) 0.5 MG tablet Take 1 tablet (0.5 mg total) by mouth at bedtime as needed for anxiety. 01/24/22   Florencia Reasons, MD  lovastatin (MEVACOR) 20 MG tablet Take 20 mg by mouth daily. 09/29/14   [provider]  metoprolol tartrate (LOPRESSOR) 25 MG tablet Take 1 tablet (25 mg total) by mouth 2  (two) times daily. Patient taking differently: Take 37.5 mg by mouth 2 (two) times daily. 12/20/21   Orson Eva, MD  Multiple Vitamin (MULTIVITAMIN) tablet Take 1 tablet by mouth daily.    [provider]  niacin (NIASPAN) 500 MG CR tablet Take 500 mg by mouth daily. 09/29/14   [provider]  oxyCODONE (OXYCONTIN) 20 mg 12 hr tablet Take 1 tablet (20 mg total) by mouth every 12 (twelve) hours. 01/07/22   Derek Jack, MD  Oxycodone HCl 10 MG TABS Take 1 tablet (10 mg total) by mouth every 4 (four) hours as needed. 12/26/21   Derek Jack, MD  PACLITAXEL IV Inject into the vein every 21 ( twenty-one) days. 01/16/22   [provider]  PEMBROLIZUMAB IV Inject into the vein every 21 ( twenty-one) days. 01/16/22   [provider]  polyethylene glycol (MIRALAX / GLYCOLAX) 17 g packet Take 17 g by mouth daily as needed for moderate constipation.    [provider]  prochlorperazine (COMPAZINE) 10 MG tablet Take 1 tablet (10 mg total) by mouth every 6 (six) hours as needed for nausea or vomiting. 01/15/22   Derek Jack, MD  prochlorperazine (COMPAZINE) 5 MG tablet Take 1 tablet (5 mg total) by mouth every 6 (six) hours as needed for nausea or vomiting. Patient not taking: Reported on 01/20/2022 12/20/21   Orson Eva, MD  valACYclovir (VALTREX) 500 MG tablet Take 500 mg by mouth daily.    [provider]  zolpidem (AMBIEN) 10 MG tablet Take 10 mg by mouth at bedtime as needed for sleep. 12/24/21   [provider]      Allergies    Patient has no known allergies.    Review of Systems   Review of Systems  Respiratory:  Positive for shortness of breath.   All other systems reviewed and are negative.   Physical Exam Updated Vital Signs BP 114/68   Pulse 93   Temp 97.8 F (36.6 C) (Oral)   SpO2 95%  Physical Exam Vitals and nursing note reviewed.   71 year old male, resting comfortably and in no acute distress.  Vital signs are normal. Oxygen saturation is 95%, which is normal. Head is normocephalic and atraumatic. PERRLA, EOMI. Oropharynx is clear. Neck is nontender and supple without adenopathy or JVD. Back is nontender and there is no CVA tenderness. Lungs are clear without rales, wheezes, or rhonchi.  Slight wheezes noted with cough. Chest is nontender. Heart has regular rate and rhythm without murmur. Abdomen is soft, flat, nontender. Extremities have no cyanosis or edema, full range of motion is present. Skin is warm and dry without rash. Neurologic: Mental status is normal, cranial nerves are intact, moves all extremities equally.  ED Results / Procedures / Treatments   Labs (all labs ordered are listed, but  only abnormal results are displayed) Labs Reviewed  CBC WITH DIFFERENTIAL/PLATELET - Abnormal; Notable for the following components:      Result Value   WBC 92.1 (*)    RBC 3.18 (*)    Hemoglobin 9.1 (*)    HCT 28.6 (*)    RDW 17.6 (*)    Neutro Abs 29.5 (*)    Lymphs Abs 59.1 (*)    Monocytes Absolute 1.4 (*)    Abs Immature Granulocytes 2.20 (*)    All other components within normal limits  COMPREHENSIVE METABOLIC PANEL - Abnormal; Notable for the following components:   Sodium 133 (*)    Potassium 2.9 (*)    Glucose, Bld 118 (*)    Calcium 7.7 (*)    Total Protein 5.2 (*)    Albumin 2.5 (*)    Alkaline Phosphatase 163 (*)    Total Bilirubin 0.0 (*)    All other components within normal limits  PROTIME-INR - Abnormal; Notable for the following components:   Prothrombin Time 16.7 (*)    INR 1.4 (*)    All other components within normal limits  APTT - Abnormal; Notable for the following components:   aPTT 41 (*)    All other components within normal limits  URINALYSIS, ROUTINE W REFLEX MICROSCOPIC - Abnormal; Notable for the following components:   Protein, ur 100 (*)    All other components within normal limits  D-DIMER, QUANTITATIVE - Abnormal; Notable for the  following components:   D-Dimer, Quant 2.28 (*)    All other components within normal limits  CBC WITH DIFFERENTIAL/PLATELET - Abnormal; Notable for the following components:   RBC 3.11 (*)    Hemoglobin 8.8 (*)    HCT 28.2 (*)    RDW 17.7 (*)    All other components within normal limits  COMPREHENSIVE METABOLIC PANEL - Abnormal; Notable for the following components:   Sodium 134 (*)    Potassium 3.2 (*)    Glucose, Bld 127 (*)    Calcium 7.8 (*)    Total Protein 4.8 (*)    Albumin 2.4 (*)    Alkaline Phosphatase 149 (*)    All other components within normal limits  CULTURE, BLOOD (ROUTINE X 2)  CULTURE, BLOOD (ROUTINE X 2)  URINE CULTURE  EXPECTORATED SPUTUM ASSESSMENT W GRAM STAIN, RFLX TO RESP C  LACTIC ACID, PLASMA  HIV ANTIBODY (ROUTINE TESTING W REFLEX)  LEGIONELLA PNEUMOPHILA SEROGP 1 UR AG  STREP PNEUMONIAE URINARY ANTIGEN   Radiology CT Angio Chest PE W and/or Wo Contrast  Result Date: 01/27/2022 CLINICAL DATA:  Pulmonary embolism suspected, positive D-dimer. Lung cancer with leukemia. Progressive worsening cough. EXAM: CT ANGIOGRAPHY CHEST WITH CONTRAST TECHNIQUE: Multidetector CT imaging of the chest was performed using the standard protocol during bolus administration of intravenous contrast. Multiplanar CT image reconstructions and MIPs were obtained to evaluate the vascular anatomy. RADIATION DOSE REDUCTION: This exam was performed according to the departmental dose-optimization program which includes automated exposure control, adjustment of the mA and/or kV according to patient size and/or use of iterative reconstruction technique. CONTRAST:  134mL OMNIPAQUE IOHEXOL 350 MG/ML SOLN COMPARISON:  11/07/2021. FINDINGS: Cardiovascular: The heart is normal in size and there is a trace pericardial effusion. Scattered coronary artery calcifications are noted. The distal tip of a right chest port terminates in the right atrium. There is atherosclerotic calcification of the aorta  without evidence of aneurysm. The pulmonary trunk is normal in caliber. There is minimal opacification in left upper lobe  segmental and subsegmental pulmonary arteries anteriorly likely in part due to extrinsic mass effect in the possibility of superimposed thrombus can not be excluded. No evidence of right heart strain. Mediastinum/Nodes: Enlarged lymph nodes are present in the mediastinum and left hilum. There is a subcarinal lymph node measuring 1.9 cm in short axis diameter. Enlarged left hilar lymph node is noted measuring 2.7 cm resulting in mass effect on the left upper lobe pulmonary arteries. No axillary lymphadenopathy. The thyroid gland, trachea, and esophagus are within normal limits. Lungs/Pleura: Bronchial wall thickening is noted bilaterally with patchy airspace disease in the lower lobes. There are small bilateral pleural effusions. No pneumothorax. There is new patchy airspace disease in the left upper lobe. A cavitary lesion is noted in the left upper lobe measuring 3.1 x 1.1 cm, axial image 44. There is a new nodule in the left upper lobe adjacent to this measuring 1 cm, axial image 43. There is a parenchymal opacity in the posterior segment of the left lower lobe measuring 1.1 cm, decreased in size from the prior exam., axial image 38. Upper Abdomen: No acute abnormality. Musculoskeletal: Degenerative changes are present in the thoracic spine. Destructive expansile lesions with associated soft tissue thickening are noted in the T2 and T4 ribs on the right. No acute fracture is identified. Review of the MIP images confirms the above findings. IMPRESSION: 1. Minimal opacification of the left upper lobe pulmonary arteries anteriorly, likely related to extrinsic mass effect. The possibility of superimposed pulmonary embolism can not be excluded. No evidence of right heart strain. 2. Bronchial wall thickening bilaterally with patchy airspace disease in the lower lobes bilaterally and new airspace  disease in the left upper lobe, which may be infectious or inflammatory. 3. Left upper lobe cavitary lesion and nodules, stable to slightly increased and likely associated with known history of lung cancer. 4. Small bilateral pleural effusions. 5. Interval worsening of mediastinal and left hilar adenopathy with expansile destructive lesions in the T2 and T4 ribs on the right with associated soft tissue thickening, concerning for metastatic disease. 6. Aortic atherosclerosis. Electronically Signed   By: Brett Fairy M.D.   On: 01/27/2022 03:14   DG Chest Portable 1 View  Result Date: 01/27/2022 CLINICAL DATA:  Cough. EXAM: PORTABLE CHEST 1 VIEW COMPARISON:  Chest radiograph dated 01/22/2022. FINDINGS: Right-sided Port-A-Cath in similar position. Similar appearance of nodular and linear density in the left upper lobe. There is interval development of an area of airspace opacity in the retrocardiac/left lung base most consistent with developing infiltrate. No pleural effusion pneumothorax. The cardiac silhouette is within limits. No acute osseous pathology. IMPRESSION: 1. New left lung base infiltrate. 2. Similar appearance of nodular and linear density in the left upper lobe. Electronically Signed   By: Anner Crete M.D.   On: 01/27/2022 00:20    Procedures Procedures    Medications Ordered in ED Medications - No data to display  ED Course/ Medical Decision Making/ A&P                           Medical Decision Making Amount and/or Complexity of Data Reviewed Labs: ordered. Radiology: ordered. ECG/medicine tests: ordered.  Risk Prescription drug management. Decision regarding hospitalization.   Worsening cough and shortness of breath.  This is likely multifactorial as he does have known lung cancer and COPD.  Consider pneumonia, pulmonary embolism, COPD exacerbation.  I reviewed his old records, and he had been admitted to  the hospital on 10/21 for dehydration following chemotherapy.   First chemotherapy infusion was on 01/16/2022.  Because of fever last night, I have ordered him be placed on the evolving sepsis pathway.  He was noted to be hypoxic at triage with oxygen saturation 88-89%, improved with oxygen via nasal cannula.  I have ordered a dose of albuterol and ipratropium via nebulizer.  He states no improvement with albuterol and ipratropium.  Chest x-ray shows probable retrocardiac infiltrate.  I have independently viewed the image, and agree with the radiologist's interpretation.  I have reviewed and interpreted his laboratory test, and my interpretation is moderate to severe hypokalemia, mild hyponatremia, elevated random glucose, elevated alkaline phosphatase, normochromic normocytic anemia which is not significantly changed from baseline, marked leukocytosis consistent with history of chronic lymphocytic leukemia.  Also, on review of past records, he received an injection of pegfilgrastatim, which will also raise his WBC.  Lactic acid level is normal.  D-dimer is elevated, CT angiogram of the chest has been ordered.  CT angiogram of the chest shows no pulmonary embolism, patchy airspace disease in the lower lobes bilaterally and left upper lobe which likely represents pneumonia.  Cavitary left upper lobe lesion which is his primary lung malignancy.  Worsening of mediastinal and left hilar adenopathy and destructive lesions of T2 and T4 as well as ribs on the right likely representing metastatic disease.  I have independently viewed the images, and agree with radiologist interpretation.  I have ordered antibiotics for community-acquired pneumonia (ceftriaxone plus azithromycin).  Case is discussed with Dr. Clearence Ped of [facility], who agrees to admit the patient.  Final Clinical Impression(s) / ED Diagnoses Final diagnoses:  Community acquired pneumonia of left upper lobe of lung  Hyponatremia  Hypokalemia    Rx / DC Orders ED Discharge Orders     None          Delora Fuel, MD 09/81/19 [phone]

## 2022-01-27 NOTE — Assessment & Plan Note (Signed)
-   White blood cell count 92 - Has been higher - Patient follows with Dr. Raliegh Ip -Inpatient consult to oncology - Continue to monitor

## 2022-01-27 NOTE — ED Notes (Signed)
Report given to Brie on floor.

## 2022-01-27 NOTE — Progress Notes (Signed)
Pharmacy Antibiotic Note  Alfred Brown is a 71 y.o. male admitted on 01/26/2022 with pneumonia.  Pharmacy has been consulted for zosyn dosing.  Plan: Zosyn 3.375g IV q8h (4 hour infusion).  Height: 6\' 1"  (185.4 cm) Weight: 73.8 kg (162 lb 11.2 oz) IBW/kg (Calculated) : 79.9  Temp (24hrs), Avg:98.8 F (37.1 C), Min:97.8 F (36.6 C), Max:100.3 F (37.9 C)  Recent Labs  Lab 01/22/22 0524 01/23/22 0439 01/24/22 0400 01/27/22 0038 01/27/22 0604  WBC 41.0* 43.3* 56.5* 92.1* 83.7*  CREATININE 0.68 0.68 0.68 0.79 0.72  LATICACIDVEN  --   --   --  0.9  --     Estimated Creatinine Clearance: 89.7 mL/min (by C-G formula based on SCr of 0.72 mg/dL).    No Known Allergies  Antimicrobials this admission: Zosyn 10/29 >>  Azith 10/29 >> CTX 10/29   Microbiology results: 10/29 BCx: pending 10/29 UCx: pending  10/29 Sputum: pending  10/29 MRSA PCR: pending  Thank you for allowing pharmacy to be a part of this patient's care.  Ramond Craver 01/27/2022 7:50 PM

## 2022-01-27 NOTE — Assessment & Plan Note (Signed)
-   Increased cough, fever, dyspnea - Leukocytosis is difficult to interpret given his leukemia, but white blood cell count is 92.1 - Patient also recently had a colony-stimulating factor on 1020 - Patient was started on Rocephin and Zithromax - Blood cultures pending - Expectorated sputum culture pending - Pneumonia noted on chest x-ray, infectious bronchial thickening noted on CTA - Continue to monitor

## 2022-01-27 NOTE — Assessment & Plan Note (Signed)
-   Continue benzo

## 2022-01-27 NOTE — ED Notes (Signed)
Pt on bedside commode.

## 2022-01-27 NOTE — ED Notes (Signed)
Patient transported to CT 

## 2022-01-27 NOTE — Assessment & Plan Note (Signed)
-   Poor p.o. intake is a large contributor - Supplement meals with Ensure shakes

## 2022-01-27 NOTE — Assessment & Plan Note (Signed)
-   Continue home getting pain meds - Morphine for breakthrough pain - Continue to monitor

## 2022-01-27 NOTE — Assessment & Plan Note (Signed)
-   Oxygen sats down to 88% - Normalized with 2 L nasal cannula - Likely multifactorial with lung cancer and pneumonia both contributing - PE cannot be completely ruled out due to opacification of the left upper lobe, but is thought to be unlikely at this point - D-dimer was positive at 2.28> nonspecific finding - EKG pending - No chest pain - Wean off O2 as tolerated - DuoNeb scheduled - Albuterol as needed - Continue antibiotics for pneumonia - Continue to monitor

## 2022-01-27 NOTE — Assessment & Plan Note (Signed)
-   Continue Wellbutrin 

## 2022-01-28 ENCOUNTER — Other Ambulatory Visit: Payer: Self-pay

## 2022-01-28 ENCOUNTER — Other Ambulatory Visit: Payer: Medicare Other

## 2022-01-28 ENCOUNTER — Ambulatory Visit: Payer: Medicare Other | Admitting: Hematology

## 2022-01-28 DIAGNOSIS — J9621 Acute and chronic respiratory failure with hypoxia: Secondary | ICD-10-CM | POA: Diagnosis not present

## 2022-01-28 DIAGNOSIS — J189 Pneumonia, unspecified organism: Secondary | ICD-10-CM | POA: Diagnosis not present

## 2022-01-28 DIAGNOSIS — C9110 Chronic lymphocytic leukemia of B-cell type not having achieved remission: Secondary | ICD-10-CM | POA: Diagnosis not present

## 2022-01-28 DIAGNOSIS — G893 Neoplasm related pain (acute) (chronic): Secondary | ICD-10-CM | POA: Diagnosis not present

## 2022-01-28 LAB — CBC WITH DIFFERENTIAL/PLATELET
Abs Immature Granulocytes: 0 10*3/uL (ref 0.00–0.07)
Band Neutrophils: 2 %
Basophils Absolute: 0 10*3/uL (ref 0.0–0.1)
Basophils Relative: 0 %
Eosinophils Absolute: 0 10*3/uL (ref 0.0–0.5)
Eosinophils Relative: 0 %
HCT: 25.8 % — ABNORMAL LOW (ref 39.0–52.0)
Hemoglobin: 8.1 g/dL — ABNORMAL LOW (ref 13.0–17.0)
Lymphocytes Relative: 51 %
Lymphs Abs: 31.3 10*3/uL — ABNORMAL HIGH (ref 0.7–4.0)
MCH: 28.9 pg (ref 26.0–34.0)
MCHC: 31.4 g/dL (ref 30.0–36.0)
MCV: 92.1 fL (ref 80.0–100.0)
Monocytes Absolute: 0.6 10*3/uL (ref 0.1–1.0)
Monocytes Relative: 1 %
Neutro Abs: 29.5 10*3/uL — ABNORMAL HIGH (ref 1.7–7.7)
Neutrophils Relative %: 46 %
Platelets: 144 10*3/uL — ABNORMAL LOW (ref 150–400)
RBC: 2.8 MIL/uL — ABNORMAL LOW (ref 4.22–5.81)
RDW: 18 % — ABNORMAL HIGH (ref 11.5–15.5)
WBC: 61.4 10*3/uL (ref 4.0–10.5)
nRBC: 0 % (ref 0.0–0.2)

## 2022-01-28 LAB — RENAL FUNCTION PANEL
Albumin: 2.3 g/dL — ABNORMAL LOW (ref 3.5–5.0)
Anion gap: 10 (ref 5–15)
BUN: 10 mg/dL (ref 8–23)
CO2: 20 mmol/L — ABNORMAL LOW (ref 22–32)
Calcium: 7.5 mg/dL — ABNORMAL LOW (ref 8.9–10.3)
Chloride: 106 mmol/L (ref 98–111)
Creatinine, Ser: 0.75 mg/dL (ref 0.61–1.24)
GFR, Estimated: >60 mL/min
Glucose, Bld: 93 mg/dL (ref 70–99)
Phosphorus: 3 mg/dL (ref 2.5–4.6)
Potassium: 3.6 mmol/L (ref 3.5–5.1)
Sodium: 136 mmol/L (ref 135–145)

## 2022-01-28 LAB — STREP PNEUMONIAE URINARY ANTIGEN: Strep Pneumo Urinary Antigen: NEGATIVE

## 2022-01-28 LAB — COMPREHENSIVE METABOLIC PANEL
ALT: 23 U/L (ref 0–44)
AST: 16 U/L (ref 15–41)
Albumin: 2.5 g/dL — ABNORMAL LOW (ref 3.5–5.0)
Alkaline Phosphatase: 163 U/L — ABNORMAL HIGH (ref 38–126)
Anion gap: 7 (ref 5–15)
BUN: 12 mg/dL (ref 8–23)
CO2: 25 mmol/L (ref 22–32)
Calcium: 7.7 mg/dL — ABNORMAL LOW (ref 8.9–10.3)
Chloride: 101 mmol/L (ref 98–111)
Creatinine, Ser: 0.79 mg/dL (ref 0.61–1.24)
GFR, Estimated: >60 mL/min (ref >60)
Glucose, Bld: 118 mg/dL — ABNORMAL HIGH (ref 70–99)
Potassium: 2.9 mmol/L — ABNORMAL LOW (ref 3.5–5.1)
Sodium: 133 mmol/L — ABNORMAL LOW (ref 135–145)
Total Bilirubin: 0.7 mg/dL (ref 0.3–1.2)
Total Protein: 5.2 g/dL — ABNORMAL LOW (ref 6.5–8.1)

## 2022-01-28 LAB — URINE CULTURE: Culture: NO GROWTH

## 2022-01-28 LAB — MAGNESIUM: Magnesium: 1.7 mg/dL (ref 1.7–2.4)

## 2022-01-28 MED ORDER — POTASSIUM CHLORIDE CRYS ER 20 MEQ PO TBCR
40.0000 meq | EXTENDED_RELEASE_TABLET | Freq: Once | ORAL | Status: AC
  Administered 2022-01-28: 40 meq via ORAL
  Filled 2022-01-28: qty 2

## 2022-01-28 MED ORDER — MAGNESIUM SULFATE 2 GM/50ML IV SOLN
2.0000 g | Freq: Once | INTRAVENOUS | Status: AC
  Administered 2022-01-28: 2 g via INTRAVENOUS
  Filled 2022-01-28: qty 50

## 2022-01-28 NOTE — Progress Notes (Signed)
Critical lab called with WBC of 61.4. Yesterday WBC was 83.7. Sent MD secure chat, awaiting response.

## 2022-01-28 NOTE — TOC Initial Note (Signed)
Transition of Care [facility]) - Initial/Assessment Note    Patient Details  Name: Alfred Brown MRN: [id number] Date of Birth: June 24, 1950  [facility] CM/SW Contact:    Boneta Lucks, RN Phone Number: 01/28/2022, 10:55 AM  Clinical Narrative:     Patient readmitted with pneumonia, Assess with here with cough and complication after chemo. [facility] for [facility]PT. TOC to follow for discharge needs, discharge planning for 2 days.               Expected Discharge Plan: [place] Barriers to Discharge: Continued Medical Work up   Patient Goals and CMS Choice       Expected Discharge Plan and Services Expected Discharge Plan: [place]      Prior Living Arrangements/Services      Activities of Daily Living   ADL Screening (condition at time of admission) Is the patient deaf or have difficulty hearing?: No Does the patient have difficulty seeing, even when wearing glasses/contacts?: No Does the patient have difficulty concentrating, remembering, or making decisions?: No Does the patient have difficulty dressing or bathing?: Yes Does the patient have difficulty walking or climbing stairs?: Yes  Permission Sought/Granted          Emotional Assessment        Alcohol / Substance Use: Not Applicable Psych Involvement: No (comment)  Admission diagnosis:  Pneumonia [J18.9] Patient Active Problem List   Diagnosis Date Noted   Pneumonia 01/27/2022   Hypocalcemia 01/27/2022   Pressure injury of skin 01/24/2022   Protein-calorie malnutrition, severe 01/21/2022   Hypokalemia 01/20/2022   Hypoalbuminemia 01/20/2022   Depression 01/20/2022   Failure to thrive in adult 01/20/2022   Unintentional weight loss 01/20/2022   Prolonged QT interval 01/20/2022   SVT (supraventricular tachycardia) 01/20/2022   Sinus tachycardia 01/20/2022   Squamous cell lung cancer, left ([place]) 01/07/2022   Intractable  vomiting 12/19/2021   Intractable nausea and vomiting 12/18/2021   Leukocytosis 12/18/2021   Cancer associated pain 12/18/2021   Generalized anxiety disorder 12/18/2021   Rib pain on right side 11/22/2021   Mass of left lung 11/15/2021   Lipoma of anterior chest wall 09/20/2021   Peyronie's disease 11/24/2019   Hypogammaglobulinemia ([place]) 09/23/2019   COPD (chronic obstructive pulmonary disease) ([place]) 08/05/2019   Acute exacerbation of COPD with asthma ([place]) 06/03/2019   Acute and chronic respiratory failure with hypoxia ([place]) 06/03/2019   History of COVID-19 dx 05-09-19 06/03/2019   Pneumonia due to COVID-19 virus 06/02/2019   GERD (gastroesophageal reflux disease)    Hyperlipidemia    Palpitations    Chronic lymphocytic leukemia ([place])    Aortic valve disorder    Right bundle branch block    Tobacco abuse    PCP:  Redmond School, MD Pharmacy:   [facility], [place] [address] [place] [address] Phone: [phone] Fax: [phone]   Readmission Risk Interventions    01/28/2022   10:39 AM 01/21/2022   11:57 AM  Readmission Risk Prevention Plan  Transportation Screening Complete Complete  HRI or Home Care Consult  Complete  Social Work Consult for [place] Planning/Counseling  Complete  Palliative Care Screening  Not Applicable  Medication Review Press photographer) Complete Complete  PCP or Specialist appointment within 3-5 days of discharge Not Complete   HRI or Home Care Consult Complete   Palliative Care Screening Not  Complete   [place] Not Applicable

## 2022-01-28 NOTE — Progress Notes (Signed)
PROGRESS NOTE    [facility]  BWL:[id number] DOB: 1950-05-20 DOA: 01/26/2022 PCP: Redmond School, MD    Brief Narrative:  71 year old male with a history of chronic lymphocytic leukemia, recently diagnosed squamous cell carcinoma of the lung, stage IV with mets to bone, was recently in the hospital after his first cycle of chemotherapy with nausea, vomiting, poor p.o. intake.  He was treated with IV fluids, started on Marinol.  He was discharged home, but unfortunately had to be readmitted due to concerns for developing pneumonia.  Patient was noted to be mildly febrile, had a cough.  Started on intravenous antibiotics as well as IV fluids.  CT angiogram could not rule out pulmonary embolism so he has been started on therapeutic Lovenox.  Oncology consulted.   Assessment & Plan:   Principal Problem:   Pneumonia Active Problems:   Hyperlipidemia   Chronic lymphocytic leukemia (HCC)   Acute and chronic respiratory failure with hypoxia (HCC)   Cancer associated pain   Generalized anxiety disorder   Hypokalemia   Hypoalbuminemia   Depression   Hypocalcemia   Healthcare associated pneumonia -Admitted with cough, fever and shortness of breath -He is immunocompromised from recent chemotherapy -Recently discharged from the hospital on 10/26 after being in the hospital for 5 days -We will continue with broad-spectrum antibiotics -Checking urinary antigens, could likely discontinue azithromycin if Legionella antigen is negative -Since he does have cavitating lesion, will broaden coverage to Zosyn to cover anaerobes -MRSA PCR negative -Sputum cultures also been ordered -With his history of CLL and being immunocompromised, case was discussed with oncology with recommendations to give one dose of IVIG on 10/30 -Continue pulmonary hygiene   Possible pulmonary embolism -CT angiogram of the chest could not rule out left upper lobe PE.  It was noted that there may be external compression  of left upper lobe pulmonary artery by underlying malignancy, although concurrent pulmonary embolism cannot be ruled out -Reviewed with radiologist and VQ scan would not add any additional information -Reviewed with oncology and with patient's underlying risk factors including active malignancy, he was started on therapeutic dose lovenox     Chronic lymphocytic leukemia -White blood cell count 92,000 on admission -He also recently received G-CSF with his chemo treatment -now trended down to 61,000 -Continue to monitor   Squamous cell lung cancer, stage IV, metastatic to bones -Recently completed radiation therapy to his left ribs -He recently completed his first chemo treatment approximately a week ago -CTA chest done on this admission did seem to show progression of underlying cancer, although after discussing with oncology, likely too early to judge any progression of disease since he has only had 1 cycle of chemotherapy   Poor p.o. intake -Overall, he feels that dysgeusia is better after his last admission, although his p.o. intake still remains poor -We will have nutrition evaluate   Hyperlipidemia -He is on statin   Hypokalemia -Replace  Dysgeusia Poor po intake -related to chemo -continue on marinol, started last admission -continue antiemetics  Goals of care -seen by palliative care on 10/26 -currently patient wishes to continue full code, full scope of care that is available   DVT prophylaxis: SCDs Start: 01/27/22 0547  Code Status: full code Family Communication: discussed with wife at bedside Disposition Plan: Status is: Inpatient Remains inpatient appropriate because: continued treatment with IV antibiotics.     Consultants:  oncology  Procedures:    Antimicrobials:  Azithromycin 10/29> Zosyn 10/29>    Subjective: Patient reports at  times he has felt outside of his body. He says he can taste tobacco. He continues to have cough. Wife notes that he  ate some food items and enjoyed the taste, which has not been the case in a while  Objective: Vitals:   01/28/22 0459 01/28/22 0713 01/28/22 1149 01/28/22 1215  BP: 121/72  111/69 121/70  Pulse: 85  82 79  Resp: 18   17  Temp: 99 F (37.2 C)   98.2 F (36.8 C)  TempSrc:    Oral  SpO2: 94% 95% 96% 95%  Weight:      Height:        Intake/Output Summary (Last 24 hours) at 01/28/2022 1324 Last data filed at 01/28/2022 0739 Gross per 24 hour  Intake 2543.44 ml  Output 200 ml  Net 2343.44 ml   Filed Weights   01/27/22 0546  Weight: 73.8 kg    Examination:  General exam: Appears calm and comfortable  Respiratory system: fair air movement bilaterally with occasional rhonchi. Respiratory effort normal. Cardiovascular system: S1 & S2 heard, RRR. No JVD, murmurs, rubs, gallops or clicks. No pedal edema. Gastrointestinal system: Abdomen is nondistended, soft and nontender. No organomegaly or masses felt. Normal bowel sounds heard. Central nervous system: Alert and oriented. No focal neurological deficits. Extremities: Symmetric 5 x 5 power. Skin: No rashes, lesions or ulcers Psychiatry: Judgement and insight appear normal. Mood & affect appropriate.     Data Reviewed: I have personally reviewed following labs and imaging studies  CBC: Recent Labs  Lab 01/23/22 0439 01/24/22 0400 01/27/22 0038 01/27/22 0604 01/28/22 0443  WBC 43.3* 56.5* 92.1* 83.7* 61.4*  NEUTROABS 11.3* 22.0* 29.5* 41.0* 29.5*  HGB 7.7* 8.4* 9.1* 8.8* 8.1*  HCT 24.4* 26.8* 28.6* 28.2* 25.8*  MCV 91.7 90.8 89.9 90.7 92.1  PLT 203 200 196 165 696*   Basic Metabolic Panel: Recent Labs  Lab 01/21/22 1354 01/22/22 0524 01/23/22 0439 01/24/22 0400 01/27/22 0038 01/27/22 0604 01/28/22 0443  NA 132* 135 135 136 133* 134* 136  K 3.4* 3.7 3.2* 3.2* 2.9* 3.2* 3.6  CL 98 106 106 104 101 101 106  CO2 26 23 23 23 25 24  20*  GLUCOSE 101* 94 94 106* 118* 127* 93  BUN 10 11 9  7* 12 10 10   CREATININE  0.64 0.68 0.68 0.68 0.79 0.72 0.75  CALCIUM 7.6* 7.7* 7.7* 8.0* 7.7* 7.8* 7.5*  MG 1.9 1.6* 1.4* 1.7  --   --  1.7  PHOS  --   --   --  2.2*  --   --  3.0   GFR: Estimated Creatinine Clearance: 89.7 mL/min (by C-G formula based on SCr of 0.75 mg/dL). Liver Function Tests: Recent Labs  Lab 01/27/22 0038 01/27/22 0604 01/28/22 0443  AST 16 15  --   ALT 23 21  --   ALKPHOS 163* 149*  --   BILITOT 0.0* 0.5  --   PROT 5.2* 4.8*  --   ALBUMIN 2.5* 2.4* 2.3*   No results for input(s): "LIPASE", "AMYLASE" in the last 168 hours. No results for input(s): "AMMONIA" in the last 168 hours. Coagulation Profile: Recent Labs  Lab 01/27/22 0046  INR 1.4*   Cardiac Enzymes: No results for input(s): "CKTOTAL", "CKMB", "CKMBINDEX", "TROPONINI" in the last 168 hours. BNP (last 3 results) No results for input(s): "PROBNP" in the last 8760 hours. HbA1C: No results for input(s): "HGBA1C" in the last 72 hours. CBG: No results for input(s): "GLUCAP" in the last 168  hours. Lipid Profile: No results for input(s): "CHOL", "HDL", "LDLCALC", "TRIG", "CHOLHDL", "LDLDIRECT" in the last 72 hours. Thyroid Function Tests: No results for input(s): "TSH", "T4TOTAL", "FREET4", "T3FREE", "THYROIDAB" in the last 72 hours. Anemia Panel: No results for input(s): "VITAMINB12", "FOLATE", "FERRITIN", "TIBC", "IRON", "RETICCTPCT" in the last 72 hours. Sepsis Labs: Recent Labs  Lab 01/27/22 0038  LATICACIDVEN 0.9    Recent Results (from the past 240 hour(s))  Resp Panel by RT-PCR (Flu A&B, Covid) Anterior Nasal Swab     Status: None   Collection Time: 01/19/22 10:20 PM   Specimen: Anterior Nasal Swab  Result Value Ref Range Status   SARS Coronavirus 2 by RT PCR NEGATIVE NEGATIVE Final    Comment: (NOTE) SARS-CoV-2 target nucleic acids are NOT DETECTED.  The SARS-CoV-2 RNA is generally detectable in upper respiratory specimens during the acute phase of infection. The lowest concentration of SARS-CoV-2  viral copies this assay can detect is 138 copies/mL. A negative result does not preclude SARS-Cov-2 infection and should not be used as the sole basis for treatment or other patient management decisions. A negative result may occur with  improper specimen collection/handling, submission of specimen other than nasopharyngeal swab, presence of viral mutation(s) within the areas targeted by this assay, and inadequate number of viral copies(<138 copies/mL). A negative result must be combined with clinical observations, patient history, and epidemiological information. The expected result is Negative.  Fact Sheet for Patients:  EntrepreneurPulse.com.au  Fact Sheet for Healthcare Providers:  IncredibleEmployment.be  This test is no t yet approved or cleared by the [place] FDA and  has been authorized for detection and/or diagnosis of SARS-CoV-2 by FDA under an Emergency Use Authorization (EUA). This EUA will remain  in effect (meaning this test can be used) for the duration of the COVID-19 declaration under Section 564(b)(1) of the Act, 21 U.S.C.section 360bbb-3(b)(1), unless the authorization is terminated  or revoked sooner.       Influenza A by PCR NEGATIVE NEGATIVE Final   Influenza B by PCR NEGATIVE NEGATIVE Final    Comment: (NOTE) The Xpert Xpress SARS-CoV-2/FLU/RSV plus assay is intended as an aid in the diagnosis of influenza from Nasopharyngeal swab specimens and should not be used as a sole basis for treatment. Nasal washings and aspirates are unacceptable for Xpert Xpress SARS-CoV-2/FLU/RSV testing.  Fact Sheet for Patients: EntrepreneurPulse.com.au  Fact Sheet for Healthcare Providers: IncredibleEmployment.be  This test is not yet approved or cleared by the [place] FDA and has been authorized for detection and/or diagnosis of SARS-CoV-2 by FDA under an Emergency Use Authorization  (EUA). This EUA will remain in effect (meaning this test can be used) for the duration of the COVID-19 declaration under Section 564(b)(1) of the Act, 21 U.S.C. section 360bbb-3(b)(1), unless the authorization is terminated or revoked.  Performed at [facility], [address], [place], [place] [address]   Culture, blood (routine x 2)     Status: None   Collection Time: 01/19/22 11:57 PM   Specimen: Left Antecubital; Blood  Result Value Ref Range Status   Specimen Description LEFT ANTECUBITAL  Final   Special Requests   Final    BOTTLES DRAWN AEROBIC AND ANAEROBIC Blood Culture adequate volume   Culture   Final    NO GROWTH 5 DAYS Performed at [facility], [address], [place], [place] [address]    Report Status 01/25/2022 FINAL  Final  Culture, blood (routine x 2)     Status: None   Collection Time:  01/20/22 12:03 AM   Specimen: BLOOD LEFT ARM  Result Value Ref Range Status   Specimen Description BLOOD LEFT ARM  Final   Special Requests   Final    BOTTLES DRAWN AEROBIC ONLY Blood Culture adequate volume   Culture   Final    NO GROWTH 5 DAYS Performed at [facility], [address], [place], [place] [address]    Report Status 01/25/2022 FINAL  Final  Blood Culture (routine x 2)     Status: None (Preliminary result)   Collection Time: 01/27/22 12:38 AM   Specimen: BLOOD LEFT ARM  Result Value Ref Range Status   Specimen Description BLOOD LEFT ARM  Final   Special Requests   Final    BOTTLES DRAWN AEROBIC AND ANAEROBIC Blood Culture adequate volume   Culture   Final    NO GROWTH < 24 HOURS Performed at [facility], [address], [place], [place] [address]    Report Status PENDING  Incomplete  Urine Culture     Status: None   Collection Time: 01/27/22 12:42 AM   Specimen: In/Out Cath Urine  Result Value Ref Range Status   Specimen Description   Final    IN/OUT CATH URINE Performed at [facility], [address] [place], [place] [address]    Special Requests    Final    NONE Performed at [facility] [address] [place], [place] [address]    Culture   Final    NO GROWTH Performed at [facility], [place] [address], [place], [place] [address]    Report Status 01/28/2022 FINAL  Final  Blood Culture (routine x 2)     Status: None (Preliminary result)   Collection Time: 01/27/22 12:47 AM   Specimen: Right Antecubital; Blood  Result Value Ref Range Status   Specimen Description RIGHT ANTECUBITAL  Final   Special Requests   Final    BOTTLES DRAWN AEROBIC AND ANAEROBIC Blood Culture adequate volume   Culture   Final    NO GROWTH < 24 HOURS Performed at [facility], [address], [place], [place] [address]    Report Status PENDING  Incomplete  MRSA Next Gen by PCR, Nasal     Status: None   Collection Time: 01/27/22  7:20 PM   Specimen: Nasal Mucosa; Nasal Swab  Result Value Ref Range Status   MRSA by PCR Next Gen NOT DETECTED NOT DETECTED Final    Comment: (NOTE) The GeneXpert MRSA Assay (FDA approved for NASAL specimens only), is one component of a comprehensive MRSA colonization surveillance program. It is not intended to diagnose MRSA infection nor to guide or monitor treatment for MRSA infections. Test performance is not FDA approved in patients less than 76 years old. Performed at [facility], [address] [place], [place] [address]          Radiology Studies: CT Angio Chest PE W and/or Wo Contrast  Result Date: 01/27/2022 CLINICAL DATA:  Pulmonary embolism suspected, positive D-dimer. Lung cancer with leukemia. Progressive worsening cough. EXAM: CT ANGIOGRAPHY CHEST WITH CONTRAST TECHNIQUE: Multidetector CT imaging of the chest was performed using the standard protocol during bolus administration of intravenous contrast. Multiplanar CT image reconstructions and MIPs were obtained to evaluate the vascular anatomy. RADIATION DOSE REDUCTION: This exam was performed according to the departmental dose-optimization program  which includes automated exposure control, adjustment of the mA and/or kV according to patient size and/or use of iterative reconstruction technique. CONTRAST:  146mL OMNIPAQUE IOHEXOL  350 MG/ML SOLN COMPARISON:  11/07/2021. FINDINGS: Cardiovascular: The heart is normal in size and there is a trace pericardial effusion. Scattered coronary artery calcifications are noted. The distal tip of a right chest port terminates in the right atrium. There is atherosclerotic calcification of the aorta without evidence of aneurysm. The pulmonary trunk is normal in caliber. There is minimal opacification in left upper lobe segmental and subsegmental pulmonary arteries anteriorly likely in part due to extrinsic mass effect in the possibility of superimposed thrombus can not be excluded. No evidence of right heart strain. Mediastinum/Nodes: Enlarged lymph nodes are present in the mediastinum and left hilum. There is a subcarinal lymph node measuring 1.9 cm in short axis diameter. Enlarged left hilar lymph node is noted measuring 2.7 cm resulting in mass effect on the left upper lobe pulmonary arteries. No axillary lymphadenopathy. The thyroid gland, trachea, and esophagus are within normal limits. Lungs/Pleura: Bronchial wall thickening is noted bilaterally with patchy airspace disease in the lower lobes. There are small bilateral pleural effusions. No pneumothorax. There is new patchy airspace disease in the left upper lobe. A cavitary lesion is noted in the left upper lobe measuring 3.1 x 1.1 cm, axial image 44. There is a new nodule in the left upper lobe adjacent to this measuring 1 cm, axial image 43. There is a parenchymal opacity in the posterior segment of the left lower lobe measuring 1.1 cm, decreased in size from the prior exam., axial image 38. Upper Abdomen: No acute abnormality. Musculoskeletal: Degenerative changes are present in the thoracic spine. Destructive expansile lesions with associated soft tissue thickening  are noted in the T2 and T4 ribs on the right. No acute fracture is identified. Review of the MIP images confirms the above findings. IMPRESSION: 1. Minimal opacification of the left upper lobe pulmonary arteries anteriorly, likely related to extrinsic mass effect. The possibility of superimposed pulmonary embolism can not be excluded. No evidence of right heart strain. 2. Bronchial wall thickening bilaterally with patchy airspace disease in the lower lobes bilaterally and new airspace disease in the left upper lobe, which may be infectious or inflammatory. 3. Left upper lobe cavitary lesion and nodules, stable to slightly increased and likely associated with known history of lung cancer. 4. Small bilateral pleural effusions. 5. Interval worsening of mediastinal and left hilar adenopathy with expansile destructive lesions in the T2 and T4 ribs on the right with associated soft tissue thickening, concerning for metastatic disease. 6. Aortic atherosclerosis. Electronically Signed   By: Brett Fairy M.D.   On: 01/27/2022 03:14   DG Chest Portable 1 View  Result Date: 01/27/2022 CLINICAL DATA:  Cough. EXAM: PORTABLE CHEST 1 VIEW COMPARISON:  Chest radiograph dated 01/22/2022. FINDINGS: Right-sided Port-A-Cath in similar position. Similar appearance of nodular and linear density in the left upper lobe. There is interval development of an area of airspace opacity in the retrocardiac/left lung base most consistent with developing infiltrate. No pleural effusion pneumothorax. The cardiac silhouette is within limits. No acute osseous pathology. IMPRESSION: 1. New left lung base infiltrate. 2. Similar appearance of nodular and linear density in the left upper lobe. Electronically Signed   By: Anner Crete M.D.   On: 01/27/2022 00:20        Scheduled Meds:  aspirin  325 mg Oral Daily   buPROPion  150 mg Oral Daily   dronabinol  5 mg Oral BID AC   enoxaparin (LOVENOX) injection  75 mg Subcutaneous Q12H    feeding supplement  237  mL Oral BID BM   guaiFENesin  600 mg Oral BID   ipratropium-albuterol  3 mL Nebulization BID   metoprolol tartrate  25 mg Oral BID   oxyCODONE  20 mg Oral Q12H   polyethylene glycol  17 g Oral Daily   pravastatin  20 mg Oral q1800   Continuous Infusions:  0.9 % NaCl with KCl 40 mEq / L 50 mL/hr at 01/28/22 0739   azithromycin Stopped (01/28/22 0555)   piperacillin-tazobactam (ZOSYN)  IV 3.375 g (01/28/22 1228)     LOS: 1 day    Time spent: 34mins    Kathie Dike, MD [facility]   If 7PM-7AM, please contact night-coverage [email or web address]  01/28/2022, 1:24 PM

## 2022-01-28 NOTE — Progress Notes (Signed)
Follow up  DOCUMENTATION CODES:   Severe malnutrition in context of chronic illness (ongoing)  INTERVENTION:  Carnation Instant Breakfast -TID  Whole milk with meals  Magic cup TID (vanilla)  Regular diet   Request re-weight to verify change  NUTRITION DIAGNOSIS:   Severe Malnutrition related to cancer and cancer related treatments as evidenced by energy intake < or equal to 75% for > or equal to 1 month, percent weight loss (>7.5% x 3 months), moderate muscle depletion, severe muscle depletion.   GOAL:  Patient will meet greater than or equal to 90% of their needs (if patient remains full code)   MONITOR:  PO intake, Supplement acceptance, Labs, Skin, Weight trends  REASON FOR ASSESSMENT:   Consult Assessment of nutrition requirement/status  ASSESSMENT: Patient discharged 10/26 returns with shortness of breath and cough.   Patient is a 71 yo male with hx of COPD, CLL, non-small cell lung cancer with metastasis to bone. Palliative radiation 10/4 per chart. 10/16 Port-A-Cath placed by IR. 10/16 Patient contacted by [facility] 10/18 Chemotherapy Beryle Flock, taxol, and carbo D1C1) 10/20-Udenyca and 1 liter of Normal Saline  10/23 Patient taking medication for appetite. Patient family bedside. He has not eaten today. There is a [employer] on tray table which he "tasted" but has not consumed. Patient having difficulty hypersensitivity to smell of foods. He has put tissue in his nose to limit the overwhelming smell. We talked about favorite foods and discussed oral nutrition supplements. He also received education and a case of Ensure during visit with Clayton RD 10/16 & 10/17 per chart.  10/30 Patient identified as malnourished during recent hospitalization. Admitted on 10/28 with pneumonia. Patient family are bedside. Patient is more talkative today than our last visit. He oral intake has remained poor since discharge. Patient likes [employer]. Encouraged him to consume 3-4 times daily. Add high calorie foods such as peanut butter or powdered milk. If he is unable to maintain nutrition orally recommend consider enteral feeding if patient and medical team agrees.  Today he ate ~30% of a chicken biscuit, fruit and chocolate pudding at lunch. He has ordered chicken noodle soup, crackers and pudding for dinner. Patient is not having as much difficulty with dysgeusia and nausea. Nursing brought patient a ONS with ice cream mixed to drink.   Patient has hx unintentional weight loss. Current weight 73.8 reflects a gain of 3 kg x 1 week. Will request re-weight given patient minimal intake since discharge and during recent hospitalization. 10/22 pt weight 70.6 kg. 12/06/21 pt weight 75.8 kg.01/07/22- 68.7 kg.  Medications: Marinol, klor-con, miralax, antiemetics.   IVF- NS with KCL @ 50 ml/hr.      Latest Ref Rng & Units 01/28/2022    4:43 AM 01/27/2022    6:04 AM 01/27/2022   12:38 AM  BMP  Glucose 70 - 99 mg/dL 93  127  118   BUN 8 - 23 mg/dL 10  10  12    Creatinine 0.61 - 1.24 mg/dL 0.75  0.72  0.79   Sodium 135 - 145 mmol/L 136  134  133   Potassium 3.5 - 5.1 mmol/L 3.6  3.2  2.9   Chloride 98 - 111 mmol/L 106  101  101   CO2 22 - 32 mmol/L 20  24  25    Calcium 8.9 - 10.3 mg/dL 7.5  7.8  7.7       NUTRITION - FOCUSED PHYSICAL EXAM:  Flowsheet Row Most Recent  Value  Orbital Region Mild depletion  Upper Arm Region Moderate depletion  Thoracic and Lumbar Region Moderate depletion  Buccal Region Mild depletion  Temple Region Moderate depletion  Clavicle Bone Region Mild depletion  Clavicle and Acromion Bone Region Moderate depletion  Dorsal Hand Mild depletion  Patellar Region Moderate depletion  Anterior Thigh Region Severe depletion  Edema (RD Assessment) None  Hair Reviewed  Eyes Reviewed  Mouth Reviewed  Skin Reviewed  Nails Reviewed       Diet Order:   Diet Order             Diet regular Room service  appropriate? Yes; Fluid consistency: Thin  Diet effective now                   EDUCATION NEEDS:  Education needs have been addressed  Skin:  Skin Assessment: Skin Integrity Issues: Skin Integrity Issues:: Stage I Stage I: sacrum  Last BM:  10/26 constipation per nursing (given miralax)  Height:   Ht Readings from Last 1 Encounters:  01/27/22 6\' 1"  (1.854 m)    Weight:   Wt Readings from Last 1 Encounters:  01/27/22 73.8 kg    Ideal Body Weight:   84 kg  BMI:  Body mass index is 21.47 kg/m.  Estimated Nutritional Needs:   Kcal:   2200-2400  Protein:   95-110 gr  Fluid:   >2 liters daily   Colman Cater MS,RD,CSG,LDN Contact:AMION

## 2022-01-28 NOTE — Progress Notes (Signed)
Corrected result called from lab @ 1735. Bilirubin from yesterday was resulted as 0.0 correct result was 0.7. MD Memon notified.

## 2022-01-29 DIAGNOSIS — J9621 Acute and chronic respiratory failure with hypoxia: Secondary | ICD-10-CM | POA: Diagnosis not present

## 2022-01-29 DIAGNOSIS — C9110 Chronic lymphocytic leukemia of B-cell type not having achieved remission: Secondary | ICD-10-CM | POA: Diagnosis not present

## 2022-01-29 DIAGNOSIS — G893 Neoplasm related pain (acute) (chronic): Secondary | ICD-10-CM | POA: Diagnosis not present

## 2022-01-29 DIAGNOSIS — J189 Pneumonia, unspecified organism: Secondary | ICD-10-CM | POA: Diagnosis not present

## 2022-01-29 LAB — CBC
HCT: 26.5 % — ABNORMAL LOW (ref 39.0–52.0)
Hemoglobin: 8 g/dL — ABNORMAL LOW (ref 13.0–17.0)
MCH: 28.4 pg (ref 26.0–34.0)
MCHC: 30.2 g/dL (ref 30.0–36.0)
MCV: 94 fL (ref 80.0–100.0)
Platelets: 147 10*3/uL — ABNORMAL LOW (ref 150–400)
RBC: 2.82 MIL/uL — ABNORMAL LOW (ref 4.22–5.81)
RDW: 17.9 % — ABNORMAL HIGH (ref 11.5–15.5)
WBC: 46.6 10*3/uL — ABNORMAL HIGH (ref 4.0–10.5)
nRBC: 0 % (ref 0.0–0.2)

## 2022-01-29 LAB — BASIC METABOLIC PANEL
Anion gap: 8 (ref 5–15)
BUN: 9 mg/dL (ref 8–23)
CO2: 25 mmol/L (ref 22–32)
Calcium: 7.9 mg/dL — ABNORMAL LOW (ref 8.9–10.3)
Chloride: 106 mmol/L (ref 98–111)
Creatinine, Ser: 0.83 mg/dL (ref 0.61–1.24)
GFR, Estimated: >60 mL/min (ref >60)
Glucose, Bld: 90 mg/dL (ref 70–99)
Potassium: 3.7 mmol/L (ref 3.5–5.1)
Sodium: 139 mmol/L (ref 135–145)

## 2022-01-29 LAB — MAGNESIUM: Magnesium: 2.1 mg/dL (ref 1.7–2.4)

## 2022-01-29 MED ORDER — APIXABAN 5 MG PO TABS: 5.0000 mg | ORAL_TABLET | Freq: Two times a day (BID) | ORAL | Status: DC

## 2022-01-29 MED ORDER — APIXABAN 5 MG PO TABS
10.0000 mg | ORAL_TABLET | Freq: Two times a day (BID) | ORAL | Status: DC
  Administered 2022-01-29 – 2022-01-30 (×2): 10 mg via ORAL
  Filled 2022-01-29 (×2): qty 2

## 2022-01-29 MED ORDER — AMOXICILLIN-POT CLAVULANATE 875-125 MG PO TABS
1.0000 | ORAL_TABLET | Freq: Two times a day (BID) | ORAL | Status: DC
  Administered 2022-01-29 – 2022-01-30 (×2): 1 via ORAL
  Filled 2022-01-29 (×2): qty 1

## 2022-01-29 NOTE — Plan of Care (Signed)
  Problem: Education: Goal: Knowledge of General Education information will improve Description Including pain rating scale, medication(s)/side effects and non-pharmacologic comfort measures Outcome: Progressing   Problem: Health Behavior/Discharge Planning: Goal: Ability to manage health-related needs will improve Outcome: Progressing   

## 2022-01-29 NOTE — Evaluation (Signed)
Physical Therapy Evaluation Patient Details Name: Alfred Brown MRN: [id number] DOB: September 11, 1950 Today's Date: 01/29/2022  History of Present Illness  Alfred Brown is a 71 y.o. male with medical history significant of CLL, COPD, lung cancer, GERD, hyperlipidemia, right bundle branch block, and more presents the ED with a chief complaint of cough and dyspnea.  Patient was discharged from the hospital approximately 3 days ago.  At that time he presented for generalized weakness and nausea.  This was after his first chemotherapy for lung cancer.  That chemo happened on 10/18.  Patient had had a couple of watery stools, which were treated with Imodium.  He took nausea medication at home but it was ineffective and patient became disoriented so his wife decided it was time for them to present to the ER.  Patient was admitted for intractable nausea and vomiting he was started on Marinol which she does think helped.  He had electrolyte abnormalities at that time as would be expected with diarrhea and poor p.o. intake.  He also had SVT with possible A-fib during that time.  He was continued on Lopressor.  He had an echocardiogram during that stay which showed preserved ejection fraction and no wall motion abnormality.  Palliative was consulted for goals of care discussion.  At this time patient is still full code.  Today, patient reports that his cough has been getting worse over the last 3 days.  He reports it is nonproductive.  The cough is worsening and that it is more violent.  He has felt feverish and chilled at home.  Tmax at home 100.6.  He denies any chest pain or palpitations.  He has had worsening shortness of breath.  The dyspnea is worse on exertion.  He also has orthopnea and has been sleeping in a recliner.  Patient denies any peripheral edema.  He is continued to have nausea and decreased appetite but he reports he is eating more than he was prior to the last hospitalization.  He had some loose stools,  and feels like he is going to have another at the time of my exam.  He reports not pain in his abdomen but movement.  Patient reports he has inhalers at home.  He takes them for his shortness of breath and they only offer temporary relief.  They do not help with the cough at all.  Patient does not wear oxygen at home per his report.  Patient has no other complaints at this time.   Clinical Impression  Patient functioning near baseline for functional mobility and gait. Patient demonstrates good return for hallway ambulation without AD/loss of balance. Patient taken off 2 LPM O2 and put on room air with SpO2 at 95% at rest, averaging 94% with activity, and 96% at rest following ambulation. Patient left on room air and tolerated sitting up in chair after therapy. Plan: patient discharged from physical therapy to care of nursing for ambulation daily as tolerated for length of stay.     Recommendations for follow up therapy are one component of a multi-disciplinary discharge planning process, led by the attending physician.  Recommendations may be updated based on patient status, additional functional criteria and insurance authorization.  Follow Up Recommendations Home health PT      Assistance Recommended at Discharge PRN  Patient can return home with the following  A little help with walking and/or transfers;A little help with bathing/dressing/bathroom;Help with stairs or ramp for entrance;Assistance with cooking/housework    Equipment Recommendations None  recommended by PT  Recommendations for Other Services       Functional Status Assessment Patient has had a recent decline in their functional status and demonstrates the ability to make significant improvements in function in a reasonable and predictable amount of time.     Precautions / Restrictions Precautions Precautions: Fall Restrictions Weight Bearing Restrictions: No      Mobility  Bed Mobility Overal bed mobility:  Independent Bed Mobility: Supine to Sit     Supine to sit: Independent          Transfers Overall transfer level: Modified independent Equipment used: None Transfers: Bed to chair/wheelchair/BSC, Sit to/from Stand Sit to Stand: Modified independent (Device/Increase time)   Step pivot transfers: Modified independent (Device/Increase time), Supervision       General transfer comment: patient able to transfer to chair with mod I/supervision following ambulation    Ambulation/Gait Ambulation/Gait assistance: Modified independent (Device/Increase time) Gait Distance (Feet): 115 Feet Assistive device: None Gait Pattern/deviations: Decreased stride length Gait velocity: normal     General Gait Details: patient able to ambulate w/o AD or loss of balance with normal gait speed, slightly unsteady on feet at times. patient taken off 2 LPM O2 and put on room air with SpO2 at 95% at rest, averaging 94% with activity, and 96% at rest following ambulation  Stairs            Wheelchair Mobility    Modified Rankin (Stroke Patients Only)       Balance Overall balance assessment: Mild deficits observed, not formally tested Sitting-balance support: Feet supported, No upper extremity supported Sitting balance-Leahy Scale: Good Sitting balance - Comments: seated EOB   Standing balance support: No upper extremity supported, During functional activity Standing balance-Leahy Scale: Good Standing balance comment: without AD                             Pertinent Vitals/Pain Pain Assessment Pain Assessment: No/denies pain    Home Living Family/patient expects to be discharged to:: Private residence Living Arrangements: Spouse/significant other Available Help at Discharge: Family Type of Home: Mobile home Home Access: Ramped entrance       Home Layout: One level Home Equipment: Other (comment) (is borrowing shower chair from a relative)      Prior Function  Prior Level of Function : Needs assist       Physical Assist : ADLs (physical)     Mobility Comments: Ambulates without AD. ADLs Comments: Wife assists with some ADLs     Hand Dominance   Dominant Hand: Right    Extremity/Trunk Assessment   Upper Extremity Assessment Upper Extremity Assessment: Defer to OT evaluation    Lower Extremity Assessment Lower Extremity Assessment: Overall WFL for tasks assessed    Cervical / Trunk Assessment Cervical / Trunk Assessment: Normal  Communication   Communication: No difficulties  Cognition Arousal/Alertness: Awake/alert Behavior During Therapy: WFL for tasks assessed/performed Overall Cognitive Status: Within Functional Limits for tasks assessed                                          General Comments      Exercises     Assessment/Plan    PT Assessment Patient needs continued PT services  PT Problem List Decreased strength;Decreased activity tolerance;Decreased mobility;Decreased balance       PT Treatment  Interventions      PT Goals (Current goals can be found in the Care Plan section)  Acute Rehab PT Goals Patient Stated Goal: return home PT Goal Formulation: With patient Time For Goal Achievement: 02/05/22 Potential to Achieve Goals: Good    Frequency       Co-evaluation PT/OT/SLP Co-Evaluation/Treatment: Yes Reason for Co-Treatment: To address functional/ADL transfers PT goals addressed during session: Mobility/safety with mobility;Balance OT goals addressed during session: ADL's and self-care       AM-PAC PT "6 Clicks" Mobility  Outcome Measure Help needed turning from your back to your side while in a flat bed without using bedrails?: None Help needed moving from lying on your back to sitting on the side of a flat bed without using bedrails?: None Help needed moving to and from a bed to a chair (including a wheelchair)?: None Help needed standing up from a chair using your arms  (e.g., wheelchair or bedside chair)?: None Help needed to walk in hospital room?: None Help needed climbing 3-5 steps with a railing? : A Little 6 Click Score: 23    End of Session   Activity Tolerance: Patient tolerated treatment well;Patient limited by fatigue Patient left: in chair;with call bell/phone within reach;with family/visitor present Nurse Communication: Mobility status PT Visit Diagnosis: Unsteadiness on feet (R26.81);Other abnormalities of gait and mobility (R26.89);Muscle weakness (generalized) (M62.81)    Time: 1287-8676 PT Time Calculation (min) (ACUTE ONLY): 15 min   Charges:   PT Evaluation $PT Eval Low Complexity: 1 Low PT Treatments $Therapeutic Activity: 8-22 mins        Zigmund Gottron, SPT

## 2022-01-29 NOTE — TOC Progression Note (Signed)
Transition of Care [facility]) - Progression Note    Patient Details  Name: Alfred Brown MRN: [id number] Date of Birth: 04-16-1950  [facility] CM/SW Contact  Boneta Lucks, RN Phone Number: 01/29/2022, 3:20 PM  Clinical Narrative:   PT is recommending HHPT. CM spoke with his wife at bedside. She states [facility] called to schedule his first visit and they had to come to the hospital. They do want HHRN/PT. MD aware to order.  Patient on SDOH list for transportation.  Wife provides transportation as needed.    Expected Discharge Plan: [place] Barriers to Discharge: Continued Medical Work up  Expected Discharge Plan and Services Expected Discharge Plan: [place]      Social Determinants of Health (SDOH) Interventions Transportation Interventions: Inpatient TOC (wife provides transportation)  Readmission Risk Interventions    01/28/2022   10:39 AM 01/21/2022   11:57 AM  Readmission Risk Prevention Plan  Transportation Screening Complete Complete  HRI or Home Care Consult  Complete  Social Work Consult for [place] Planning/Counseling  Complete  Palliative Care Screening  Not Applicable  Medication Review Press photographer) Complete Complete  PCP or Specialist appointment within 3-5 days of discharge Not Complete   HRI or Home Care Consult Complete   Palliative Care Screening Not Complete   [place] Not Applicable

## 2022-01-29 NOTE — Progress Notes (Signed)
PROGRESS NOTE    Alfred Brown [facility]  OFB:[id number] DOB: 1950-06-26 DOA: 01/26/2022 PCP: Redmond School, MD    Brief Narrative:  71 year old male with a history of chronic lymphocytic leukemia, recently diagnosed squamous cell carcinoma of the lung, stage IV with mets to bone, was recently in the hospital after his first cycle of chemotherapy with nausea, vomiting, poor p.o. intake.  He was treated with IV fluids, started on Marinol.  He was discharged home, but unfortunately had to be readmitted due to concerns for developing pneumonia.  Patient was noted to be mildly febrile, had a cough.  Started on intravenous antibiotics as well as IV fluids.  CT angiogram could not rule out pulmonary embolism so he has been started on therapeutic Lovenox.  Oncology consulted.   Assessment & Plan:   Principal Problem:   Pneumonia Active Problems:   Hyperlipidemia   Chronic lymphocytic leukemia (HCC)   Acute and chronic respiratory failure with hypoxia (HCC)   Cancer associated pain   Generalized anxiety disorder   Hypokalemia   Hypoalbuminemia   Depression   Hypocalcemia   Healthcare associated pneumonia -Admitted with cough, fever and shortness of breath -He is immunocompromised from recent chemotherapy -Recently discharged from the hospital on 10/26 after being in the hospital for 5 days -We will continue with broad-spectrum antibiotics -Strep antigen negative, could likely discontinue azithromycin if Legionella antigen is negative -Since he does have cavitating lesion, will broaden coverage to Zosyn to cover anaerobes -MRSA PCR negative -With his history of CLL and being immunocompromised, case was discussed with oncology with recommendations to give one dose of IVIG on 10/30 -Continue pulmonary hygiene -clinically improving, will transition zosyn to augmentin   Possible pulmonary embolism -CT angiogram of the chest could not rule out left upper lobe PE.  It was noted that there may be  external compression of left upper lobe pulmonary artery by underlying malignancy, although concurrent pulmonary embolism cannot be ruled out -Reviewed with radiologist and VQ scan would not add any additional information -Reviewed with oncology and with patient's underlying risk factors including active malignancy, he was started on therapeutic dose lovenox -transitioned to [place] for discharge     Chronic lymphocytic leukemia -White blood cell count 92,000 on admission -He also recently received G-CSF with his chemo treatment -now trended down to 46,000 -Continue to monitor   Squamous cell lung cancer, stage IV, metastatic to bones -Recently completed radiation therapy to his left ribs -He recently completed his first chemo treatment approximately a week ago -CTA chest done on this admission did seem to show progression of underlying cancer, although after discussing with oncology, likely too early to judge any progression of disease since he has only had 1 cycle of chemotherapy     Hyperlipidemia -He is on statin   Hypokalemia -Replace  Dysgeusia Poor po intake -related to chemo -continue on marinol, started last admission -continue antiemetics -nutrition following -seems to be improving  Goals of care -seen by palliative care on 10/26 -currently patient wishes to continue full code, full scope of care that is available  Generalized weakness -seen by PT with recommendations for HHPT   DVT prophylaxis: SCDs Start: 01/27/22 0547 apixaban (ELIQUIS) tablet 10 mg  apixaban (ELIQUIS) tablet 5 mg  Code Status: full code Family Communication: discussed with wife at bedside Disposition Plan: Status is: Inpatient Remains inpatient appropriate because: continued treatment with IV antibiotics.     Consultants:  oncology  Procedures:    Antimicrobials:  Azithromycin 10/29> Zosyn  10/29> 10/31 Augmentin 10/31>   Subjective: Patient says he feels better today.  Shortness of breath improving  Objective: Vitals:   01/29/22 0338 01/29/22 0642 01/29/22 0734 01/29/22 1327  BP: 120/70   103/66  Pulse: 79  81 87  Resp: 16  20 19   Temp: 97.9 F (36.6 C)   98.3 F (36.8 C)  TempSrc: Oral   Oral  SpO2: 98%  96% 96%  Weight:  72.6 kg    Height:        Intake/Output Summary (Last 24 hours) at 01/29/2022 1944 Last data filed at 01/29/2022 1842 Gross per 24 hour  Intake 1554.14 ml  Output --  Net 1554.14 ml   Filed Weights   01/27/22 0546 01/29/22 0642  Weight: 73.8 kg 72.6 kg    Examination:  General exam: Appears calm and comfortable  Respiratory system: fair air movement bilaterally with occasional rhonchi. Respiratory effort normal. Cardiovascular system: S1 & S2 heard, RRR. No JVD, murmurs, rubs, gallops or clicks. No pedal edema. Gastrointestinal system: Abdomen is nondistended, soft and nontender. No organomegaly or masses felt. Normal bowel sounds heard. Central nervous system: Alert and oriented. No focal neurological deficits. Extremities: Symmetric 5 x 5 power. Skin: No rashes, lesions or ulcers Psychiatry: Judgement and insight appear normal. Mood & affect appropriate.     Data Reviewed: I have personally reviewed following labs and imaging studies  CBC: Recent Labs  Lab 01/23/22 0439 01/24/22 0400 01/27/22 0038 01/27/22 0604 01/28/22 0443 01/29/22 0304  WBC 43.3* 56.5* 92.1* 83.7* 61.4* 46.6*  NEUTROABS 11.3* 22.0* 29.5* 41.0* 29.5*  --   HGB 7.7* 8.4* 9.1* 8.8* 8.1* 8.0*  HCT 24.4* 26.8* 28.6* 28.2* 25.8* 26.5*  MCV 91.7 90.8 89.9 90.7 92.1 94.0  PLT 203 200 196 165 144* 824*   Basic Metabolic Panel: Recent Labs  Lab 01/23/22 0439 01/24/22 0400 01/27/22 0038 01/27/22 0604 01/28/22 0443 01/29/22 0304  NA 135 136 133* 134* 136 139  K 3.2* 3.2* 2.9* 3.2* 3.6 3.7  CL 106 104 101 101 106 106  CO2 23 23 25 24  20* 25  GLUCOSE 94 106* 118* 127* 93 90  BUN 9 7* 12 10 10 9   CREATININE 0.68 0.68 0.79 0.72 0.75  0.83  CALCIUM 7.7* 8.0* 7.7* 7.8* 7.5* 7.9*  MG 1.4* 1.7  --   --  1.7 2.1  PHOS  --  2.2*  --   --  3.0  --    GFR: Estimated Creatinine Clearance: 85 mL/min (by C-G formula based on SCr of 0.83 mg/dL). Liver Function Tests: Recent Labs  Lab 01/27/22 0038 01/27/22 0604 01/28/22 0443  AST 16 15  --   ALT 23 21  --   ALKPHOS 163* 149*  --   BILITOT 0.7 0.5  --   PROT 5.2* 4.8*  --   ALBUMIN 2.5* 2.4* 2.3*   No results for input(s): "LIPASE", "AMYLASE" in the last 168 hours. No results for input(s): "AMMONIA" in the last 168 hours. Coagulation Profile: Recent Labs  Lab 01/27/22 0046  INR 1.4*   Cardiac Enzymes: No results for input(s): "CKTOTAL", "CKMB", "CKMBINDEX", "TROPONINI" in the last 168 hours. BNP (last 3 results) No results for input(s): "PROBNP" in the last 8760 hours. HbA1C: No results for input(s): "HGBA1C" in the last 72 hours. CBG: No results for input(s): "GLUCAP" in the last 168 hours. Lipid Profile: No results for input(s): "CHOL", "HDL", "LDLCALC", "TRIG", "CHOLHDL", "LDLDIRECT" in the last 72 hours. Thyroid Function Tests: No results  for input(s): "TSH", "T4TOTAL", "FREET4", "T3FREE", "THYROIDAB" in the last 72 hours. Anemia Panel: No results for input(s): "VITAMINB12", "FOLATE", "FERRITIN", "TIBC", "IRON", "RETICCTPCT" in the last 72 hours. Sepsis Labs: Recent Labs  Lab 01/27/22 0038  LATICACIDVEN 0.9    Recent Results (from the past 240 hour(s))  Resp Panel by RT-PCR (Flu A&B, Covid) Anterior Nasal Swab     Status: None   Collection Time: 01/19/22 10:20 PM   Specimen: Anterior Nasal Swab  Result Value Ref Range Status   SARS Coronavirus 2 by RT PCR NEGATIVE NEGATIVE Final    Comment: (NOTE) SARS-CoV-2 target nucleic acids are NOT DETECTED.  The SARS-CoV-2 RNA is generally detectable in upper respiratory specimens during the acute phase of infection. The lowest concentration of SARS-CoV-2 viral copies this assay can detect is 138  copies/mL. A negative result does not preclude SARS-Cov-2 infection and should not be used as the sole basis for treatment or other patient management decisions. A negative result may occur with  improper specimen collection/handling, submission of specimen other than nasopharyngeal swab, presence of viral mutation(s) within the areas targeted by this assay, and inadequate number of viral copies(<138 copies/mL). A negative result must be combined with clinical observations, patient history, and epidemiological information. The expected result is Negative.  Fact Sheet for Patients:  EntrepreneurPulse.com.au  Fact Sheet for Healthcare Providers:  IncredibleEmployment.be  This test is no t yet approved or cleared by the [place] FDA and  has been authorized for detection and/or diagnosis of SARS-CoV-2 by FDA under an Emergency Use Authorization (EUA). This EUA will remain  in effect (meaning this test can be used) for the duration of the COVID-19 declaration under Section 564(b)(1) of the Act, 21 U.S.C.section 360bbb-3(b)(1), unless the authorization is terminated  or revoked sooner.       Influenza A by PCR NEGATIVE NEGATIVE Final   Influenza B by PCR NEGATIVE NEGATIVE Final    Comment: (NOTE) The Xpert Xpress SARS-CoV-2/FLU/RSV plus assay is intended as an aid in the diagnosis of influenza from Nasopharyngeal swab specimens and should not be used as a sole basis for treatment. Nasal washings and aspirates are unacceptable for Xpert Xpress SARS-CoV-2/FLU/RSV testing.  Fact Sheet for Patients: EntrepreneurPulse.com.au  Fact Sheet for Healthcare Providers: IncredibleEmployment.be  This test is not yet approved or cleared by the [place] FDA and has been authorized for detection and/or diagnosis of SARS-CoV-2 by FDA under an Emergency Use Authorization (EUA). This EUA will remain in effect (meaning  this test can be used) for the duration of the COVID-19 declaration under Section 564(b)(1) of the Act, 21 U.S.C. section 360bbb-3(b)(1), unless the authorization is terminated or revoked.  Performed at [facility], [address], [place], [place] [address]   Culture, blood (routine x 2)     Status: None   Collection Time: 01/19/22 11:57 PM   Specimen: Left Antecubital; Blood  Result Value Ref Range Status   Specimen Description LEFT ANTECUBITAL  Final   Special Requests   Final    BOTTLES DRAWN AEROBIC AND ANAEROBIC Blood Culture adequate volume   Culture   Final    NO GROWTH 5 DAYS Performed at [facility], [address], [place],  [address]    Report Status 01/25/2022 FINAL  Final  Culture, blood (routine x 2)     Status: None   Collection Time: 01/20/22 12:03 AM   Specimen: BLOOD LEFT ARM  Result Value Ref Range Status   Specimen Description BLOOD LEFT ARM  Final   Special Requests   Final    BOTTLES DRAWN AEROBIC ONLY Blood Culture adequate volume   Culture   Final    NO GROWTH 5 DAYS Performed at [facility], [address], [place], [place] [address]    Report Status 01/25/2022 FINAL  Final  Blood Culture (routine x 2)     Status: None (Preliminary result)   Collection Time: 01/27/22 12:38 AM   Specimen: BLOOD LEFT ARM  Result Value Ref Range Status   Specimen Description BLOOD LEFT ARM  Final   Special Requests   Final    BOTTLES DRAWN AEROBIC AND ANAEROBIC Blood Culture adequate volume   Culture   Final    NO GROWTH 2 DAYS Performed at [facility], [address], [place], [place] [address]    Report Status PENDING  Incomplete  Urine Culture     Status: None   Collection Time: 01/27/22 12:42 AM   Specimen: In/Out Cath Urine  Result Value Ref Range Status   Specimen Description   Final    IN/OUT CATH URINE Performed at [facility], [address] [place], [place] [address]    Special Requests   Final    NONE Performed at [facility], [address], [place], [place] [address]    Culture   Final    NO GROWTH Performed at [facility], [place] [address], [place], [place] [address]    Report Status 01/28/2022 FINAL  Final  Blood Culture (routine x 2)     Status: None (Preliminary result)   Collection Time: 01/27/22 12:47 AM   Specimen: Right Antecubital; Blood  Result Value Ref Range Status   Specimen Description RIGHT ANTECUBITAL  Final   Special Requests   Final    BOTTLES DRAWN AEROBIC AND ANAEROBIC Blood Culture adequate volume   Culture   Final    NO GROWTH 2 DAYS Performed at [facility], [address] [place], [place] [address]    Report Status PENDING  Incomplete  MRSA Next Gen by PCR, Nasal     Status: None   Collection Time: 01/27/22  7:20 PM   Specimen: Nasal Mucosa; Nasal Swab  Result Value Ref Range Status   MRSA by PCR Next Gen NOT DETECTED NOT DETECTED Final    Comment: (NOTE) The GeneXpert MRSA Assay (FDA approved for NASAL specimens only), is one component of a comprehensive MRSA colonization surveillance program. It is not intended to diagnose MRSA infection nor to guide or monitor treatment for MRSA infections. Test performance is not FDA approved in patients less than 14 years old. Performed at [facility], [address] [place], [place] [address]          Radiology Studies: No results found.      Scheduled Meds:  amoxicillin-clavulanate  1 tablet Oral Q12H   apixaban  10 mg Oral BID   Followed by   Derrill Memo ON 02/05/2022] apixaban  5 mg Oral BID   aspirin  325 mg Oral Daily   buPROPion  150 mg Oral Daily   dronabinol  5 mg Oral BID AC   feeding supplement  237 mL Oral BID BM   guaiFENesin  600 mg Oral BID   ipratropium-albuterol  3 mL Nebulization BID   metoprolol tartrate  25 mg Oral BID   oxyCODONE  20 mg Oral Q12H   polyethylene glycol  17 g Oral Daily   pravastatin  20 mg Oral q1800   Continuous Infusions:  0.9 % NaCl with KCl 40 mEq / L 50 mL/hr at 01/29/22 1558    azithromycin Stopped (01/29/22 0517)     LOS: 2 days    Time spent: 18mins    Kathie Dike, MD [facility]   If 7PM-7AM, please contact night-coverage [email or web address]  01/29/2022, 7:44 PM

## 2022-01-29 NOTE — Evaluation (Signed)
Occupational Therapy Evaluation Patient Details Name: Alfred Brown MRN: [id number] DOB: 13-May-1950 Today's Date: 01/29/2022   History of Present Illness Alfred Brown is a 71 y.o. male with medical history significant of CLL, COPD, lung cancer, GERD, hyperlipidemia, right bundle branch block, and more presents the ED with a chief complaint of cough and dyspnea.  Patient was discharged from the hospital approximately 3 days ago.  At that time he presented for generalized weakness and nausea.  This was after his first chemotherapy for lung cancer.  That chemo happened on 10/18.  Patient had had a couple of watery stools, which were treated with Imodium.  He took nausea medication at home but it was ineffective and patient became disoriented so his wife decided it was time for them to present to the ER.  Patient was admitted for intractable nausea and vomiting he was started on Marinol which she does think helped.  He had electrolyte abnormalities at that time as would be expected with diarrhea and poor p.o. intake.  He also had SVT with possible A-fib during that time.  He was continued on Lopressor.  He had an echocardiogram during that stay which showed preserved ejection fraction and no wall motion abnormality.  Palliative was consulted for goals of care discussion.  At this time patient is still full code.  Today, patient reports that his cough has been getting worse over the last 3 days.  He reports it is nonproductive.  The cough is worsening and that it is more violent.  He has felt feverish and chilled at home.  Tmax at home 100.6.  He denies any chest pain or palpitations.  He has had worsening shortness of breath.  The dyspnea is worse on exertion.  He also has orthopnea and has been sleeping in a recliner.  Patient denies any peripheral edema.  He is continued to have nausea and decreased appetite but he reports he is eating more than he was prior to the last hospitalization.  He had some loose  stools, and feels like he is going to have another at the time of my exam.  He reports not pain in his abdomen but movement.  Patient reports he has inhalers at home.  He takes them for his shortness of breath and they only offer temporary relief.  They do not help with the cough at all.  Patient does not wear oxygen at home per his report.  Patient has no other complaints at this time. (per DO)   Clinical Impression   Pt agreeable to OT and PT co-evaluation. Pt on 2 L supplemental O2 at start of session. Pt was removed from supplemental O2 and able to maintain SpO2 in low to mid 90s throughout ambulation in hall and room. Pt is generally weak with spouse reporting she gives moderate assist at home for bathing and dressing. Today pt was able to don socks with mild extended time. Pt also was able to ambulate in the hall with supervision to mod I level of support. Pt would benefit from home health OT services to provide strategies for energy conservation and ADL independence at home. Pt is not recommended for further acute OT services and will be discharged to care of nursing staff for remaining length of stay.        Recommendations for follow up therapy are one component of a multi-disciplinary discharge planning process, led by the attending physician.  Recommendations may be updated based on patient status, additional functional criteria and  insurance authorization.   Follow Up Recommendations  Home health OT    Assistance Recommended at Discharge Intermittent Supervision/Assistance  Patient can return home with the following A little help with bathing/dressing/bathroom    Functional Status Assessment  Patient has had a recent decline in their functional status and demonstrates the ability to make significant improvements in function in a reasonable and predictable amount of time.  Equipment Recommendations  None recommended by OT           Precautions / Restrictions  Precautions Precautions: None Restrictions Weight Bearing Restrictions: No      Mobility Bed Mobility Overal bed mobility: Modified Independent                  Transfers Overall transfer level: Needs assistance   Transfers: Sit to/from Stand, Bed to chair/wheelchair/BSC Sit to Stand: Modified independent (Device/Increase time), Supervision     Step pivot transfers: Modified independent (Device/Increase time), Supervision     General transfer comment: Pt reported feeling fatigued during ambulatory transfer.      Balance Overall balance assessment: Mild deficits observed, not formally tested, Needs assistance                                         ADL either performed or assessed with clinical judgement   ADL Overall ADL's : Needs assistance/impaired     Grooming: Modified independent;Supervision/safety;Standing       Lower Body Bathing: Supervison/ safety;Set up;Sitting/lateral leans   Upper Body Dressing : Independent;Sitting   Lower Body Dressing: Modified independent;Sitting/lateral leans Lower Body Dressing Details (indicate cue type and reason): mild extended time Toilet Transfer: Independent;Ambulation           Functional mobility during ADLs: Modified independent;Supervision/safety General ADL Comments: Pt able to dress B LE seated at EOB without assist. Pt ambulated in hall well without physical assist.     Vision Baseline Vision/History:  ("Difficulty seeing things up close") Ability to See in Adequate Light: 1 Impaired Patient Visual Report: No change from baseline Vision Assessment?: No apparent visual deficits                Pertinent Vitals/Pain Pain Assessment Pain Assessment: Faces Faces Pain Scale: No hurt     Hand Dominance Right   Extremity/Trunk Assessment Upper Extremity Assessment Upper Extremity Assessment: Generalized weakness   Lower Extremity Assessment Lower Extremity Assessment: Defer to  PT evaluation   Cervical / Trunk Assessment Cervical / Trunk Assessment: Normal   Communication Communication Communication: No difficulties   Cognition Arousal/Alertness: Awake/alert Behavior During Therapy: WFL for tasks assessed/performed Overall Cognitive Status: Within Functional Limits for tasks assessed                                                        Home Living Family/patient expects to be discharged to:: Private residence Living Arrangements: Spouse/significant other Available Help at Discharge: Family   Home Access: Ramped entrance     Home Layout: One level     Bathroom Shower/Tub: Walk-in shower;Tub only;Other (comment) (garden tub)   Bathroom Toilet: Handicapped height Bathroom Accessibility: Yes   Home Equipment: Other (comment) (is borrowing shower chair from a relative)  Prior Functioning/Environment Prior Level of Function : Needs assist             Mobility Comments: Ambulates without AD. ADLs Comments: Wife assist pt with bathing and dressing moderately.                                Co-evaluation PT/OT/SLP Co-Evaluation/Treatment: Yes Reason for Co-Treatment: To address functional/ADL transfers   OT goals addressed during session: ADL's and self-care                       End of Session Equipment Utilized During Treatment: Oxygen  Activity Tolerance: Patient tolerated treatment well Patient left: in chair;with call bell/phone within reach;with family/visitor present  OT Visit Diagnosis: Muscle weakness (generalized) (M62.81)                Time: 2122-4825 OT Time Calculation (min): 13 min Charges:  OT General Charges $OT Visit: 1 Visit OT Evaluation $OT Eval Low Complexity: 1 Low  Naimah Yingst OT, MOT  Larey Seat 01/29/2022, 9:49 AM

## 2022-01-29 NOTE — Progress Notes (Signed)
Patient resting in bed. Given one prn medication for cough during shift. Patient's wife is at bedside.

## 2022-01-30 DIAGNOSIS — J189 Pneumonia, unspecified organism: Secondary | ICD-10-CM | POA: Diagnosis not present

## 2022-01-30 LAB — LEGIONELLA PNEUMOPHILA SEROGP 1 UR AG: L. pneumophila Serogp 1 Ur Ag: NEGATIVE

## 2022-01-30 LAB — CBC
HCT: 26.3 % — ABNORMAL LOW (ref 39.0–52.0)
Hemoglobin: 8.2 g/dL — ABNORMAL LOW (ref 13.0–17.0)
MCH: 28.8 pg (ref 26.0–34.0)
MCHC: 31.2 g/dL (ref 30.0–36.0)
MCV: 92.3 fL (ref 80.0–100.0)
Platelets: 134 10*3/uL — ABNORMAL LOW (ref 150–400)
RBC: 2.85 MIL/uL — ABNORMAL LOW (ref 4.22–5.81)
RDW: 18.2 % — ABNORMAL HIGH (ref 11.5–15.5)
WBC: 42.1 10*3/uL — ABNORMAL HIGH (ref 4.0–10.5)
nRBC: 0 % (ref 0.0–0.2)

## 2022-01-30 MED ORDER — ENSURE ENLIVE PO LIQD: 237.0000 mL | Freq: Two times a day (BID) | ORAL | 12 refills | Status: AC

## 2022-01-30 MED ORDER — COMBIVENT RESPIMAT 20-100 MCG/ACT IN AERS: 1.0000 | INHALATION_SPRAY | Freq: Four times a day (QID) | RESPIRATORY_TRACT | 2 refills | Status: DC | PRN

## 2022-01-30 MED ORDER — APIXABAN 5 MG PO TABS: 10.0000 mg | ORAL_TABLET | Freq: Two times a day (BID) | ORAL | 0 refills | Status: DC

## 2022-01-30 MED ORDER — AMOXICILLIN-POT CLAVULANATE 875-125 MG PO TABS: 1.0000 | ORAL_TABLET | Freq: Two times a day (BID) | ORAL | 0 refills | Status: AC

## 2022-01-30 MED ORDER — APIXABAN 5 MG PO TABS: 5.0000 mg | ORAL_TABLET | Freq: Two times a day (BID) | ORAL | 0 refills | Status: AC

## 2022-01-30 MED ORDER — GUAIFENESIN ER 600 MG PO TB12: 600.0000 mg | ORAL_TABLET | Freq: Two times a day (BID) | ORAL | 0 refills | Status: AC

## 2022-01-30 NOTE — Discharge Summary (Signed)
Physician Discharge Summary  Alfred Brown [facility] XBJ:[id number] DOB: 1951/01/01 DOA: 01/26/2022  PCP: Redmond School, MD  Admit date: 01/26/2022  Discharge date: 01/30/2022  Admitted From:Home  Disposition:  Home  Recommendations for Outpatient Follow-up:  Follow up with PCP in 1-2 weeks Follow-up with oncology as scheduled Continue on Augmentin for several more days to complete course of treatment for pneumonia Continue to remain on Eliquis as prescribed for suspected PE Combivent prescribed as needed for shortness of breath or wheezing Continue on Mucinex for cough/congestion  Home Health: Yes with PT/RN  Equipment/Devices: None  Discharge Condition:Stable  CODE STATUS: Full  Diet recommendation: Heart Healthy  Brief/Interim Summary: 71 year old male with a history of chronic lymphocytic leukemia, recently diagnosed squamous cell carcinoma of the lung, stage IV with mets to bone, was recently in the hospital after his first cycle of chemotherapy with nausea, vomiting, poor p.o. intake.  He was treated with IV fluids, started on Marinol.  He was discharged home, but unfortunately had to be readmitted due to concerns for developing pneumonia.  Patient was noted to be mildly febrile, had a cough.  Started on intravenous antibiotics as well as IV fluids.  CT angiogram could not rule out pulmonary embolism so he has been started on therapeutic Lovenox and now transitioned to Eliquis.  He was also being treated with Zosyn for his pneumonia and has been transition to Augmentin.  Given his history of CLL and being immunocompromised, case was discussed with oncology with recommendations to give 1 dose of IVIG on 10/30.  He is white blood cell count had improved subsequently.  He continues to have some ongoing trouble with his appetite with some associated weight loss.  This appears to be related to chemotherapy and he has remained on Marinol.  PT recommending home health PT for his generalized  weakness.  Palliative care has also seen patient, but he wishes to remain full code with full scope of care.  He will have further follow-up with his oncologist in the near future.  Discharge Diagnoses:  Principal Problem:   Pneumonia Active Problems:   Hyperlipidemia   Chronic lymphocytic leukemia (HCC)   Acute and chronic respiratory failure with hypoxia (HCC)   Cancer associated pain   Generalized anxiety disorder   Hypokalemia   Hypoalbuminemia   Depression   Hypocalcemia  Principal discharge diagnosis: Healthcare associated pneumonia with possible PE in the setting of chronic lymphocytic leukemia.  Discharge Instructions  Discharge Instructions     Diet - low sodium heart healthy   Complete by: As directed    Increase activity slowly   Complete by: As directed    No wound care   Complete by: As directed       Allergies as of 01/30/2022   No Known Allergies      Medication List     TAKE these medications    acetaminophen 500 MG tablet Commonly known as: TYLENOL Take 500-1,000 mg by mouth every 6 (six) hours as needed for moderate pain.   albuterol 108 (90 Base) MCG/ACT inhaler Commonly known as: VENTOLIN HFA 2 puffs every 4 hours as needed if you can't catch your breath What changed:  how much to take how to take this when to take this reasons to take this additional instructions   amoxicillin-clavulanate 875-125 MG tablet Commonly known as: AUGMENTIN Take 1 tablet by mouth 2 (two) times daily for 3 days.   apixaban 5 MG Tabs tablet Commonly known as: ELIQUIS Take 2 tablets (10  mg total) by mouth 2 (two) times daily for 7 days.   apixaban 5 MG Tabs tablet Commonly known as: ELIQUIS Take 1 tablet (5 mg total) by mouth 2 (two) times daily. Start taking on: February 07, 2022   aspirin 325 MG tablet Take 325 mg by mouth daily.   benzonatate 100 MG capsule Commonly known as: TESSALON Take 1 capsule (100 mg total) by mouth 3 (three) times daily as  needed for cough.   buPROPion 150 MG 12 hr tablet Commonly known as: WELLBUTRIN SR Take 150 mg by mouth daily.   CALCIUM 600 + D PO Take 1 tablet by mouth daily.   CARBOPLATIN IV Inject into the vein every 21 ( twenty-one) days.   celecoxib 100 MG capsule Commonly known as: CELEBREX Take 1 capsule (100 mg total) by mouth 2 (two) times daily as needed for moderate pain.   clotrimazole-betamethasone cream Commonly known as: LOTRISONE Apply 1 application  topically daily as needed (rash).   Combivent Respimat 20-100 MCG/ACT Aers respimat Generic drug: Ipratropium-Albuterol Inhale 1 puff into the lungs every 6 (six) hours as needed for wheezing or shortness of breath.   dexamethasone 2 MG tablet Commonly known as: DECADRON Take 1 tablet (2 mg total) by mouth daily.   dextromethorphan 30 MG/5ML liquid Commonly known as: DELSYM Take 5 mLs (30 mg total) by mouth at bedtime as needed for cough.   diltiazem 30 MG tablet Commonly known as: CARDIZEM Take 30 mg by mouth as needed (heart rate).   dronabinol 5 MG capsule Commonly known as: MARINOL Take 1 capsule (5 mg total) by mouth 2 (two) times daily before lunch and supper.   feeding supplement Liqd Take 237 mLs by mouth 2 (two) times daily between meals.   fish oil-omega-3 fatty acids 1000 MG capsule Take 1 g by mouth daily.   fluticasone 50 MCG/ACT nasal spray Commonly known as: FLONASE Place 2 sprays into the nose daily.   guaiFENesin 600 MG 12 hr tablet Commonly known as: MUCINEX Take 1 tablet (600 mg total) by mouth 2 (two) times daily for 14 days.   lidocaine 5 % Commonly known as: Lidoderm Place 1 patch onto the skin daily. Remove and Discard patch within 12 hours. What changed:  when to take this reasons to take this   lidocaine-prilocaine cream Commonly known as: EMLA Apply a small amount to port a cath site and cover with plastic wrap 1 hour prior to infusion appointments   loperamide 2 MG  capsule Commonly known as: IMODIUM Take 1 capsule (2 mg total) by mouth every 6 (six) hours as needed for diarrhea or loose stools.   Loratadine 10 MG Caps Take 10 mg by mouth daily.   LORazepam 0.5 MG tablet Commonly known as: ATIVAN Take 1 tablet (0.5 mg total) by mouth at bedtime as needed for anxiety.   lovastatin 20 MG tablet Commonly known as: MEVACOR Take 20 mg by mouth daily.   metoprolol tartrate 25 MG tablet Commonly known as: LOPRESSOR Take 1 tablet (25 mg total) by mouth 2 (two) times daily. What changed: how much to take   multivitamin tablet Take 1 tablet by mouth daily.   niacin 500 MG ER tablet Commonly known as: VITAMIN B3 Take 500 mg by mouth daily.   Oxycodone HCl 10 MG Tabs Take 1 tablet (10 mg total) by mouth every 4 (four) hours as needed.   oxyCODONE 20 mg 12 hr tablet Commonly known as: OXYCONTIN Take 1 tablet (20 mg  total) by mouth every 12 (twelve) hours.   PACLITAXEL IV Inject into the vein every 21 ( twenty-one) days.   PEMBROLIZUMAB IV Inject into the vein every 21 ( twenty-one) days.   polyethylene glycol 17 g packet Commonly known as: MIRALAX / GLYCOLAX Take 17 g by mouth daily as needed for moderate constipation.   prochlorperazine 10 MG tablet Commonly known as: COMPAZINE Take 1 tablet (10 mg total) by mouth every 6 (six) hours as needed for nausea or vomiting.   valACYclovir 500 MG tablet Commonly known as: VALTREX Take 500 mg by mouth daily.   zolpidem 10 MG tablet Commonly known as: AMBIEN Take 10 mg by mouth at bedtime as needed for sleep.        Follow-up Information     [facility]   Specialty: Home Health Services Why: They will call to schedule your first home visit.        Redmond School, MD. Schedule an appointment as soon as possible for a visit in 1 week(s).   Specialty: Internal Medicine Contact information: [address] [place] [address] [phone]                 No Known Allergies  Consultations: Discussed with oncology Palliative care   Procedures/Studies: CT Angio Chest PE W and/or Wo Contrast  Result Date: 01/27/2022 CLINICAL DATA:  Pulmonary embolism suspected, positive D-dimer. Lung cancer with leukemia. Progressive worsening cough. EXAM: CT ANGIOGRAPHY CHEST WITH CONTRAST TECHNIQUE: Multidetector CT imaging of the chest was performed using the standard protocol during bolus administration of intravenous contrast. Multiplanar CT image reconstructions and MIPs were obtained to evaluate the vascular anatomy. RADIATION DOSE REDUCTION: This exam was performed according to the departmental dose-optimization program which includes automated exposure control, adjustment of the mA and/or kV according to patient size and/or use of iterative reconstruction technique. CONTRAST:  136mL OMNIPAQUE IOHEXOL 350 MG/ML SOLN COMPARISON:  11/07/2021. FINDINGS: Cardiovascular: The heart is normal in size and there is a trace pericardial effusion. Scattered coronary artery calcifications are noted. The distal tip of a right chest port terminates in the right atrium. There is atherosclerotic calcification of the aorta without evidence of aneurysm. The pulmonary trunk is normal in caliber. There is minimal opacification in left upper lobe segmental and subsegmental pulmonary arteries anteriorly likely in part due to extrinsic mass effect in the possibility of superimposed thrombus can not be excluded. No evidence of right heart strain. Mediastinum/Nodes: Enlarged lymph nodes are present in the mediastinum and left hilum. There is a subcarinal lymph node measuring 1.9 cm in short axis diameter. Enlarged left hilar lymph node is noted measuring 2.7 cm resulting in mass effect on the left upper lobe pulmonary arteries. No axillary lymphadenopathy. The thyroid gland, trachea, and esophagus are within normal limits. Lungs/Pleura: Bronchial wall thickening is noted  bilaterally with patchy airspace disease in the lower lobes. There are small bilateral pleural effusions. No pneumothorax. There is new patchy airspace disease in the left upper lobe. A cavitary lesion is noted in the left upper lobe measuring 3.1 x 1.1 cm, axial image 44. There is a new nodule in the left upper lobe adjacent to this measuring 1 cm, axial image 43. There is a parenchymal opacity in the posterior segment of the left lower lobe measuring 1.1 cm, decreased in size from the prior exam., axial image 38. Upper Abdomen: No acute abnormality. Musculoskeletal: Degenerative changes are present in the thoracic spine. Destructive expansile lesions with associated  soft tissue thickening are noted in the T2 and T4 ribs on the right. No acute fracture is identified. Review of the MIP images confirms the above findings. IMPRESSION: 1. Minimal opacification of the left upper lobe pulmonary arteries anteriorly, likely related to extrinsic mass effect. The possibility of superimposed pulmonary embolism can not be excluded. No evidence of right heart strain. 2. Bronchial wall thickening bilaterally with patchy airspace disease in the lower lobes bilaterally and new airspace disease in the left upper lobe, which may be infectious or inflammatory. 3. Left upper lobe cavitary lesion and nodules, stable to slightly increased and likely associated with known history of lung cancer. 4. Small bilateral pleural effusions. 5. Interval worsening of mediastinal and left hilar adenopathy with expansile destructive lesions in the T2 and T4 ribs on the right with associated soft tissue thickening, concerning for metastatic disease. 6. Aortic atherosclerosis. Electronically Signed   By: Brett Fairy M.D.   On: 01/27/2022 03:14   DG Chest Portable 1 View  Result Date: 01/27/2022 CLINICAL DATA:  Cough. EXAM: PORTABLE CHEST 1 VIEW COMPARISON:  Chest radiograph dated 01/22/2022. FINDINGS: Right-sided Port-A-Cath in similar  position. Similar appearance of nodular and linear density in the left upper lobe. There is interval development of an area of airspace opacity in the retrocardiac/left lung base most consistent with developing infiltrate. No pleural effusion pneumothorax. The cardiac silhouette is within limits. No acute osseous pathology. IMPRESSION: 1. New left lung base infiltrate. 2. Similar appearance of nodular and linear density in the left upper lobe. Electronically Signed   By: Anner Crete M.D.   On: 01/27/2022 00:20   DG Chest 2 View  Result Date: 01/22/2022 CLINICAL DATA:  Cough, nausea. EXAM: CHEST - 2 VIEW COMPARISON:  January 19, 2022. FINDINGS: The heart size and mediastinal contours are within normal limits. Right internal jugular Port-A-Cath is unchanged in position. Stable left upper lobe nodular densities are noted concerning for malignancy or metastatic disease. Small left pleural effusion is noted. The visualized skeletal structures are unremarkable. IMPRESSION: Stable left upper lobe nodular densities are noted concerning for malignancy or metastatic disease. Small left pleural effusion. Electronically Signed   By: Marijo Conception M.D.   On: 01/22/2022 13:38   ECHOCARDIOGRAM LIMITED  Result Date: 01/21/2022    ECHOCARDIOGRAM LIMITED REPORT   Patient Name:   Alfred Brown Date of Exam: 01/21/2022 Medical Rec #:  [id number]      Height:       73.0 in Accession #:    [id number]     Weight:       155.6 lb Date of Birth:  09-06-1950       BSA:          1.934 m Patient Age:    64 years       BP:           106/73 mmHg Patient Gender: M              HR:           96 bpm. Exam Location:  [facility] Procedure: Limited Echo Indications:    Multifocal atrial tachycardia  History:        Patient has prior history of Echocardiogram examinations, most                 recent 11/01/2021. COPD, Arrythmias:RBBB and Tachycardia; Risk                 Factors:Dyslipidemia and Current Smoker.  Lung CA, Bicuspid                  Aortic valve.  Sonographer:    Wenda Low Referring Phys: [id number] [place]  1. Left ventricular ejection fraction, by estimation, is 60 to 65%. The left ventricle has normal function. The left ventricle has no regional wall motion abnormalities. There is moderate left ventricular hypertrophy.  2. Right ventricular systolic function is normal. The right ventricular size is normal.  3. The aortic valve has an indeterminant number of cusps.  4. Limited echo FINDINGS  Left Ventricle: Left ventricular ejection fraction, by estimation, is 60 to 65%. The left ventricle has normal function. The left ventricle has no regional wall motion abnormalities. The left ventricular internal cavity size was normal in size. There is  moderate left ventricular hypertrophy. Right Ventricle: The right ventricular size is normal. Right vetricular wall thickness was not well visualized. Right ventricular systolic function is normal. Left Atrium: Left atrial size was normal in size. Right Atrium: Right atrial size was normal in size. Pericardium: There is no evidence of pericardial effusion. Aortic Valve: The aortic valve has an indeterminant number of cusps. Aorta: The aortic root is normal in size and structure. LEFT VENTRICLE PLAX 2D LVIDd:         5.00 cm LVIDs:         3.10 cm LV PW:         1.30 cm LV IVS:        1.30 cm LVOT diam:     2.00 cm LVOT Area:     3.14 cm  RIGHT VENTRICLE RV Basal diam:  3.60 cm RV Mid diam:    2.60 cm TAPSE (M-mode): 3.1 cm LEFT ATRIUM             Index        RIGHT ATRIUM           Index LA diam:        3.30 cm 1.71 cm/m   RA Area:     15.30 cm LA Vol (A2C):   45.2 ml 23.37 ml/m  RA Volume:   41.40 ml  21.41 ml/m LA Vol (A4C):   46.9 ml 24.25 ml/m LA Biplane Vol: 50.3 ml 26.01 ml/m   AORTA Ao Root diam: 3.80 cm Ao Asc diam:  3.60 cm  SHUNTS Systemic Diam: 2.00 cm Carlyle Dolly MD Electronically signed by Carlyle Dolly MD Signature Date/Time: 01/21/2022/3:33:06 PM     Final    DG Abd 2 Views  Result Date: 01/20/2022 CLINICAL DATA:  Intractable nausea and vomiting EXAM: ABDOMEN - 2 VIEW COMPARISON:  None Available. FINDINGS: No dilated large or small bowel loops are identified. No evidence of free intraperitoneal air. No evidence of soft tissue mass or abnormal fluid collection. No evidence of renal or ureteral calculi. Lung bases appear clear. No acute-appearing osseous abnormality. IMPRESSION: No acute findings.  Nonobstructive bowel gas pattern. Electronically Signed   By: Franki Cabot M.D.   On: 01/20/2022 09:34   CT Head Wo Contrast  Result Date: 01/19/2022 CLINICAL DATA:  Mental status change, unknown cause EXAM: CT HEAD WITHOUT CONTRAST TECHNIQUE: Contiguous axial images were obtained from the base of the skull through the vertex without intravenous contrast. RADIATION DOSE REDUCTION: This exam was performed according to the departmental dose-optimization program which includes automated exposure control, adjustment of the mA and/or kV according to patient size and/or use of iterative reconstruction technique. COMPARISON:  CT max  face 11/07/2021, MRI head 01/13/2013 FINDINGS: Brain: No evidence of large-territorial acute infarction. No parenchymal hemorrhage. No mass lesion. No extra-axial collection. No mass effect or midline shift. No hydrocephalus. Basilar cisterns are patent. Vascular: No hyperdense vessel. Atherosclerotic calcifications are present within the cavernous internal carotid arteries. Skull: No acute fracture or focal lesion. Sinuses/Orbits: Paranasal sinuses and mastoid air cells are clear. The orbits are unremarkable. Other: None. IMPRESSION: No acute intracranial abnormality. Electronically Signed   By: Iven Finn M.D.   On: 01/19/2022 22:59   DG Chest 1 View  Result Date: 01/19/2022 CLINICAL DATA:  Chest pain. EXAM: CHEST  1 VIEW COMPARISON:  Chest radiograph dated 12/19/2018. PET CT dated 11/22/2021. FINDINGS: Right-sided  Port-A-Cath with tip at the cavoatrial junction. A 2 cm left upper lobe subpleural nodule. No consolidative changes. No pleural effusion pneumothorax. The cardiac silhouette is within limits. Atherosclerotic calcification of the aorta. No acute osseous pathology. IMPRESSION: 1. No acute cardiopulmonary process. 2. A 2 cm left upper lobe subpleural nodule. Electronically Signed   By: Anner Crete M.D.   On: 01/19/2022 22:47   IR IMAGING GUIDED PORT INSERTION  Result Date: 01/14/2022 INDICATION: 71 year old male with right-sided non-small cell lung cancer. He presents for port catheter placement. EXAM: IMPLANTED PORT A CATH PLACEMENT WITH ULTRASOUND AND FLUOROSCOPIC GUIDANCE MEDICATIONS: None. ANESTHESIA/SEDATION: Versed two mg IV; Fentanyl 75 mcg IV; Moderate Sedation Time:  17 minute The patient's vital signs and level of consciousness were continuously monitored during the procedure by the interventional radiology nurse under my direct supervision. FLUOROSCOPY: Radiation exposure index: 3 mGy reference air kerma) COMPLICATIONS: None immediate. PROCEDURE: The right neck and chest was prepped with chlorhexidine, and draped in the usual sterile fashion using maximum barrier technique (cap and mask, sterile gown, sterile gloves, large sterile sheet, hand hygiene and cutaneous antiseptic). Local anesthesia was attained by infiltration with 1% lidocaine with epinephrine. Ultrasound demonstrated patency of the right internal jugular vein, and this was documented with an image. Under real-time ultrasound guidance, this vein was accessed with a 21 gauge micropuncture needle and image documentation was performed. A small dermatotomy was made at the access site with an 11 scalpel. A 0.018" wire was advanced into the SVC and the access needle exchanged for a 74F micropuncture vascular sheath. The 0.018" wire was then removed and a 0.035" wire advanced into the IVC. An appropriate location for the subcutaneous reservoir  was selected below the clavicle and an incision was made through the skin and underlying soft tissues. The subcutaneous tissues were then dissected using a combination of blunt and sharp surgical technique and a pocket was formed. A single lumen low-profile power injectable portacatheter was then tunneled through the subcutaneous tissues from the pocket to the dermatotomy and the port reservoir placed within the subcutaneous pocket. The venous access site was then serially dilated and a peel away vascular sheath placed over the wire. The wire was removed and the port catheter advanced into position under fluoroscopic guidance. The catheter tip is positioned in the superior cavoatrial junction. This was documented with a spot image. The portacatheter was then tested and found to flush and aspirate well. The port was flushed with saline followed by 100 units/mL heparinized saline. The pocket was then closed in two layers using first subdermal inverted interrupted absorbable sutures followed by a running subcuticular suture. The epidermis was then sealed with Dermabond. The dermatotomy at the venous access site was also closed with Dermabond. IMPRESSION: Successful placement of a right IJ approach  Power Port with ultrasound and fluoroscopic guidance. The catheter is ready for use. Electronically Signed   By: Jacqulynn Cadet M.D.   On: 01/14/2022 12:52     Discharge Exam: Vitals:   01/30/22 0553 01/30/22 0813  BP: 128/73   Pulse: 89 95  Resp: 19 20  Temp: 97.9 F (36.6 C)   SpO2: 93% 92%   Vitals:   01/29/22 2047 01/29/22 2052 01/30/22 0553 01/30/22 0813  BP: 102/60  128/73   Pulse: 87  89 95  Resp: 16  19 20   Temp: 98.4 F (36.9 C)  97.9 F (36.6 C)   TempSrc: Oral  Oral   SpO2: 93% 93% 93% 92%  Weight:   69.7 kg   Height:   6\' 1"  (1.854 m)     General: Pt is alert, awake, not in acute distress Cardiovascular: RRR, S1/S2 +, no rubs, no gallops Respiratory: CTA bilaterally, no wheezing, no  rhonchi Abdominal: Soft, NT, ND, bowel sounds + Extremities: no edema, no cyanosis    The results of significant diagnostics from this hospitalization (including imaging, microbiology, ancillary and laboratory) are listed below for reference.     Microbiology: Recent Results (from the past 240 hour(s))  Blood Culture (routine x 2)     Status: None (Preliminary result)   Collection Time: 01/27/22 12:38 AM   Specimen: BLOOD LEFT ARM  Result Value Ref Range Status   Specimen Description BLOOD LEFT ARM  Final   Special Requests   Final    BOTTLES DRAWN AEROBIC AND ANAEROBIC Blood Culture adequate volume   Culture   Final    NO GROWTH 3 DAYS Performed at [facility], [address], [place], [place] [address]    Report Status PENDING  Incomplete  Urine Culture     Status: None   Collection Time: 01/27/22 12:42 AM   Specimen: In/Out Cath Urine  Result Value Ref Range Status   Specimen Description   Final    IN/OUT CATH URINE Performed at [facility], [address] [place], [place] [address]    Special Requests   Final    NONE Performed at [facility], [address] [place], [place] [address]    Culture   Final    NO GROWTH Performed at [facility], [place] [address] [place], [place] [address]    Report Status 01/28/2022 FINAL  Final  Blood Culture (routine x 2)     Status: None (Preliminary result)   Collection Time: 01/27/22 12:47 AM   Specimen: Right Antecubital; Blood  Result Value Ref Range Status   Specimen Description RIGHT ANTECUBITAL  Final   Special Requests   Final    BOTTLES DRAWN AEROBIC AND ANAEROBIC Blood Culture adequate volume   Culture   Final    NO GROWTH 3 DAYS Performed at [facility], [address] [place], [place] [address]    Report Status PENDING  Incomplete  MRSA Next Gen by PCR, Nasal     Status: None   Collection Time: 01/27/22  7:20 PM   Specimen: Nasal Mucosa; Nasal Swab  Result Value Ref Range Status   MRSA by PCR Next Gen NOT  DETECTED NOT DETECTED Final    Comment: (NOTE) The GeneXpert MRSA Assay (FDA approved for NASAL specimens only), is one component of a comprehensive MRSA colonization surveillance program. It is not intended to diagnose MRSA infection nor to guide or monitor treatment for MRSA infections. Test performance is not FDA approved in patients less  than 50 years old. Performed at [facility], [address], [place], [place] [address]      Labs: BNP (last 3 results) No results for input(s): "BNP" in the last 8760 hours. Basic Metabolic Panel: Recent Labs  Lab 01/24/22 0400 01/27/22 0038 01/27/22 0604 01/28/22 0443 01/29/22 0304  NA 136 133* 134* 136 139  K 3.2* 2.9* 3.2* 3.6 3.7  CL 104 101 101 106 106  CO2 23 25 24  20* 25  GLUCOSE 106* 118* 127* 93 90  BUN 7* 12 10 10 9   CREATININE 0.68 0.79 0.72 0.75 0.83  CALCIUM 8.0* 7.7* 7.8* 7.5* 7.9*  MG 1.7  --   --  1.7 2.1  PHOS 2.2*  --   --  3.0  --    Liver Function Tests: Recent Labs  Lab 01/27/22 0038 01/27/22 0604 01/28/22 0443  AST 16 15  --   ALT 23 21  --   ALKPHOS 163* 149*  --   BILITOT 0.7 0.5  --   PROT 5.2* 4.8*  --   ALBUMIN 2.5* 2.4* 2.3*   No results for input(s): "LIPASE", "AMYLASE" in the last 168 hours. No results for input(s): "AMMONIA" in the last 168 hours. CBC: Recent Labs  Lab 01/24/22 0400 01/27/22 0038 01/27/22 0604 01/28/22 0443 01/29/22 0304 01/30/22 0433  WBC 56.5* 92.1* 83.7* 61.4* 46.6* 42.1*  NEUTROABS 22.0* 29.5* 41.0* 29.5*  --   --   HGB 8.4* 9.1* 8.8* 8.1* 8.0* 8.2*  HCT 26.8* 28.6* 28.2* 25.8* 26.5* 26.3*  MCV 90.8 89.9 90.7 92.1 94.0 92.3  PLT 200 196 165 144* 147* 134*   Cardiac Enzymes: No results for input(s): "CKTOTAL", "CKMB", "CKMBINDEX", "TROPONINI" in the last 168 hours. BNP: Invalid input(s): "POCBNP" CBG: No results for input(s): "GLUCAP" in the last 168 hours. D-Dimer No results for input(s): "DDIMER" in the last 72 hours. Hgb A1c No results for input(s):  "HGBA1C" in the last 72 hours. Lipid Profile No results for input(s): "CHOL", "HDL", "LDLCALC", "TRIG", "CHOLHDL", "LDLDIRECT" in the last 72 hours. Thyroid function studies No results for input(s): "TSH", "T4TOTAL", "T3FREE", "THYROIDAB" in the last 72 hours.  Invalid input(s): "FREET3" Anemia work up No results for input(s): "VITAMINB12", "FOLATE", "FERRITIN", "TIBC", "IRON", "RETICCTPCT" in the last 72 hours. Urinalysis    Component Value Date/Time   COLORURINE YELLOW 01/27/2022 0142   APPEARANCEUR CLEAR 01/27/2022 0142   APPEARANCEUR Clear 11/24/2019 1047   LABSPEC 1.019 01/27/2022 0142   PHURINE 6.0 01/27/2022 0142   GLUCOSEU NEGATIVE 01/27/2022 0142   HGBUR NEGATIVE 01/27/2022 0142   BILIRUBINUR NEGATIVE 01/27/2022 0142   BILIRUBINUR Negative 11/24/2019 [employer] 01/27/2022 0142   PROTEINUR 100 (A) 01/27/2022 0142   NITRITE NEGATIVE 01/27/2022 0142   LEUKOCYTESUR NEGATIVE 01/27/2022 0142   Sepsis Labs Recent Labs  Lab 01/27/22 0604 01/28/22 0443 01/29/22 0304 01/30/22 0433  WBC 83.7* 61.4* 46.6* 42.1*   Microbiology Recent Results (from the past 240 hour(s))  Blood Culture (routine x 2)     Status: None (Preliminary result)   Collection Time: 01/27/22 12:38 AM   Specimen: BLOOD LEFT ARM  Result Value Ref Range Status   Specimen Description BLOOD LEFT ARM  Final   Special Requests   Final    BOTTLES DRAWN AEROBIC AND ANAEROBIC Blood Culture adequate volume   Culture   Final    NO GROWTH 3 DAYS Performed at [facility], [address] [place], [place] [address]    Report Status PENDING  Incomplete  Urine Culture     Status: None   Collection Time: 01/27/22 12:42 AM   Specimen: In/Out Cath Urine  Result Value Ref Range Status   Specimen Description   Final    IN/OUT CATH URINE Performed at [facility], [address], [place], [place] [address]    Special Requests   Final    NONE Performed at [facility], [address], [place],  [place] [address]    Culture   Final    NO GROWTH Performed at [facility], [place] [address] [place], [place] [address]    Report Status 01/28/2022 FINAL  Final  Blood Culture (routine x 2)     Status: None (Preliminary result)   Collection Time: 01/27/22 12:47 AM   Specimen: Right Antecubital; Blood  Result Value Ref Range Status   Specimen Description RIGHT ANTECUBITAL  Final   Special Requests   Final    BOTTLES DRAWN AEROBIC AND ANAEROBIC Blood Culture adequate volume   Culture   Final    NO GROWTH 3 DAYS Performed at [facility], [address], [place], [place] [address]    Report Status PENDING  Incomplete  MRSA Next Gen by PCR, Nasal     Status: None   Collection Time: 01/27/22  7:20 PM   Specimen: Nasal Mucosa; Nasal Swab  Result Value Ref Range Status   MRSA by PCR Next Gen NOT DETECTED NOT DETECTED Final    Comment: (NOTE) The GeneXpert MRSA Assay (FDA approved for NASAL specimens only), is one component of a comprehensive MRSA colonization surveillance program. It is not intended to diagnose MRSA infection nor to guide or monitor treatment for MRSA infections. Test performance is not FDA approved in patients less than 43 years old. Performed at [facility], [address] [place] [address] [place], [place] [address]      Time coordinating discharge: 35 minutes  SIGNED:   Rodena Goldmann, DO [facility] 01/30/2022, 10:37 AM  If 7PM-7AM, please contact night-coverage [email or web address]

## 2022-01-30 NOTE — TOC Transition Note (Signed)
Transition of Care [facility]) - CM/SW Discharge Note   Patient Details  Name: Alfred Brown MRN: [id number] Date of Birth: 1950-11-28  [facility] CM/SW Contact:  Boneta Lucks, RN Phone Number: 01/30/2022, 10:27 AM   Clinical Narrative:   [facility]. Orders are in and [place] updated.       Barriers to Discharge: Barriers Resolved   Patient Goals and CMS Choice     Discharge Placement      Patient and family notified of of transfer: 01/30/22  Discharge Plan and Services       Social Determinants of Health (SDOH) Interventions Transportation Interventions: Inpatient TOC (wife provides transportation)  Readmission Risk Interventions    01/28/2022   10:39 AM 01/21/2022   11:57 AM  Readmission Risk Prevention Plan  Transportation Screening Complete Complete  HRI or Home Care Consult  Complete  Social Work Consult for [place] Planning/Counseling  Complete  Palliative Care Screening  Not Applicable  Medication Review Press photographer) Complete Complete  PCP or Specialist appointment within 3-5 days of discharge Not Complete   HRI or Home Care Consult Complete   Palliative Care Screening Not Complete   [place] Not Applicable

## 2022-01-30 NOTE — Progress Notes (Signed)
Pt has discharge orders, discharge teaching given to pt and pts wife and son, no further questions at this time. Pt wheeled down to main entrance to vehicle accompanied by wife.

## 2022-01-30 NOTE — Progress Notes (Signed)
   01/29/22 2047  Vitals  Temp 98.4 F (36.9 C)  Temp Source Oral  BP 102/60  MAP (mmHg) 75  BP Location Right Arm  BP Method Automatic  Patient Position (if appropriate) Sitting  Pulse Rate 87  Pulse Rate Source Monitor  Resp 16  MEWS COLOR  MEWS Score Color Green  Oxygen Therapy  SpO2 93 %  O2 Device Room Air  Pain Assessment  Pain Scale 0-10  Pain Score 4  Pain Type Acute pain  Pain Descriptors / Indicators Discomfort  Pain Frequency Constant  Pain Onset On-going  Patients Stated Pain Goal 0  Pain Intervention(s) Medication (See eMAR)  Multiple Pain Sites No  MEWS Score  MEWS Temp 0  MEWS Systolic 0  MEWS Pulse 0  MEWS RR 0  MEWS LOC 0  MEWS Score 0   Patient's metoprolol 25mg  held per Dr. Josephine Cables, Blood pressure low at 2047. Continued to monitor patient this shift no complaints of pain just the need for cough syrup.

## 2022-01-31 ENCOUNTER — Other Ambulatory Visit: Payer: Self-pay

## 2022-01-31 DIAGNOSIS — K219 Gastro-esophageal reflux disease without esophagitis: Secondary | ICD-10-CM | POA: Diagnosis not present

## 2022-01-31 DIAGNOSIS — C7951 Secondary malignant neoplasm of bone: Secondary | ICD-10-CM | POA: Diagnosis not present

## 2022-01-31 DIAGNOSIS — D801 Nonfamilial hypogammaglobulinemia: Secondary | ICD-10-CM | POA: Diagnosis not present

## 2022-01-31 DIAGNOSIS — R634 Abnormal weight loss: Secondary | ICD-10-CM | POA: Diagnosis not present

## 2022-01-31 DIAGNOSIS — I452 Bifascicular block: Secondary | ICD-10-CM | POA: Diagnosis not present

## 2022-01-31 DIAGNOSIS — Z7901 Long term (current) use of anticoagulants: Secondary | ICD-10-CM | POA: Diagnosis not present

## 2022-01-31 DIAGNOSIS — Z7982 Long term (current) use of aspirin: Secondary | ICD-10-CM | POA: Diagnosis not present

## 2022-01-31 DIAGNOSIS — J189 Pneumonia, unspecified organism: Secondary | ICD-10-CM | POA: Diagnosis not present

## 2022-01-31 DIAGNOSIS — R627 Adult failure to thrive: Secondary | ICD-10-CM | POA: Diagnosis not present

## 2022-01-31 DIAGNOSIS — F411 Generalized anxiety disorder: Secondary | ICD-10-CM | POA: Diagnosis not present

## 2022-01-31 DIAGNOSIS — F418 Other specified anxiety disorders: Secondary | ICD-10-CM | POA: Diagnosis not present

## 2022-01-31 DIAGNOSIS — C3492 Malignant neoplasm of unspecified part of left bronchus or lung: Secondary | ICD-10-CM | POA: Diagnosis not present

## 2022-01-31 DIAGNOSIS — G893 Neoplasm related pain (acute) (chronic): Secondary | ICD-10-CM | POA: Diagnosis not present

## 2022-01-31 DIAGNOSIS — E43 Unspecified severe protein-calorie malnutrition: Secondary | ICD-10-CM | POA: Diagnosis not present

## 2022-01-31 DIAGNOSIS — M1990 Unspecified osteoarthritis, unspecified site: Secondary | ICD-10-CM | POA: Diagnosis not present

## 2022-01-31 DIAGNOSIS — E785 Hyperlipidemia, unspecified: Secondary | ICD-10-CM | POA: Diagnosis not present

## 2022-01-31 DIAGNOSIS — J440 Chronic obstructive pulmonary disease with acute lower respiratory infection: Secondary | ICD-10-CM | POA: Diagnosis not present

## 2022-01-31 DIAGNOSIS — I351 Nonrheumatic aortic (valve) insufficiency: Secondary | ICD-10-CM | POA: Diagnosis not present

## 2022-01-31 DIAGNOSIS — C9110 Chronic lymphocytic leukemia of B-cell type not having achieved remission: Secondary | ICD-10-CM | POA: Diagnosis not present

## 2022-01-31 DIAGNOSIS — E8809 Other disorders of plasma-protein metabolism, not elsewhere classified: Secondary | ICD-10-CM | POA: Diagnosis not present

## 2022-01-31 DIAGNOSIS — E876 Hypokalemia: Secondary | ICD-10-CM | POA: Diagnosis not present

## 2022-01-31 DIAGNOSIS — Z791 Long term (current) use of non-steroidal anti-inflammatories (NSAID): Secondary | ICD-10-CM | POA: Diagnosis not present

## 2022-01-31 DIAGNOSIS — J9621 Acute and chronic respiratory failure with hypoxia: Secondary | ICD-10-CM | POA: Diagnosis not present

## 2022-01-31 DIAGNOSIS — I471 Supraventricular tachycardia, unspecified: Secondary | ICD-10-CM | POA: Diagnosis not present

## 2022-01-31 MED ORDER — OXYCODONE HCL ER 20 MG PO T12A: 20.0000 mg | EXTENDED_RELEASE_TABLET | Freq: Two times a day (BID) | ORAL | 0 refills | Status: DC

## 2022-01-31 NOTE — Progress Notes (Signed)
Patient's wife called reporting that the patient was started on 10mg  Eliquis BID but has had two nosebleeds today - that were stopped at home. She reports that the [facility] RN has recommended holding eliquis and asking for Dr. Tomie China recommendation. Dr. Delton Coombes reviewed the chart and recommends holding eliquis for one day and restarting at 5mg  BID. I called [name], patient's wife, and made her aware. Verbalized understanding.

## 2022-02-01 DIAGNOSIS — J189 Pneumonia, unspecified organism: Secondary | ICD-10-CM | POA: Diagnosis not present

## 2022-02-01 DIAGNOSIS — C9110 Chronic lymphocytic leukemia of B-cell type not having achieved remission: Secondary | ICD-10-CM | POA: Diagnosis not present

## 2022-02-01 DIAGNOSIS — I2699 Other pulmonary embolism without acute cor pulmonale: Secondary | ICD-10-CM | POA: Diagnosis not present

## 2022-02-01 DIAGNOSIS — R509 Fever, unspecified: Secondary | ICD-10-CM | POA: Diagnosis not present

## 2022-02-01 DIAGNOSIS — I10 Essential (primary) hypertension: Secondary | ICD-10-CM | POA: Diagnosis not present

## 2022-02-01 DIAGNOSIS — C3490 Malignant neoplasm of unspecified part of unspecified bronchus or lung: Secondary | ICD-10-CM | POA: Diagnosis not present

## 2022-02-01 DIAGNOSIS — J449 Chronic obstructive pulmonary disease, unspecified: Secondary | ICD-10-CM | POA: Diagnosis not present

## 2022-02-01 DIAGNOSIS — Z681 Body mass index (BMI) 19 or less, adult: Secondary | ICD-10-CM | POA: Diagnosis not present

## 2022-02-01 LAB — CULTURE, BLOOD (ROUTINE X 2)
Culture: NO GROWTH
Culture: NO GROWTH
Special Requests: ADEQUATE
Special Requests: ADEQUATE

## 2022-02-04 ENCOUNTER — Telehealth: Payer: Self-pay | Admitting: Dietician

## 2022-02-04 ENCOUNTER — Inpatient Hospital Stay: Payer: Medicare Other | Attending: Hematology | Admitting: Dietician

## 2022-02-04 DIAGNOSIS — J189 Pneumonia, unspecified organism: Secondary | ICD-10-CM | POA: Diagnosis not present

## 2022-02-04 DIAGNOSIS — Z5112 Encounter for antineoplastic immunotherapy: Secondary | ICD-10-CM | POA: Insufficient documentation

## 2022-02-04 DIAGNOSIS — C9110 Chronic lymphocytic leukemia of B-cell type not having achieved remission: Secondary | ICD-10-CM | POA: Diagnosis not present

## 2022-02-04 DIAGNOSIS — J440 Chronic obstructive pulmonary disease with acute lower respiratory infection: Secondary | ICD-10-CM | POA: Diagnosis not present

## 2022-02-04 DIAGNOSIS — T451X5A Adverse effect of antineoplastic and immunosuppressive drugs, initial encounter: Secondary | ICD-10-CM | POA: Insufficient documentation

## 2022-02-04 DIAGNOSIS — C3492 Malignant neoplasm of unspecified part of left bronchus or lung: Secondary | ICD-10-CM | POA: Diagnosis not present

## 2022-02-04 DIAGNOSIS — J9621 Acute and chronic respiratory failure with hypoxia: Secondary | ICD-10-CM | POA: Diagnosis not present

## 2022-02-04 DIAGNOSIS — C7951 Secondary malignant neoplasm of bone: Secondary | ICD-10-CM | POA: Insufficient documentation

## 2022-02-04 DIAGNOSIS — D701 Agranulocytosis secondary to cancer chemotherapy: Secondary | ICD-10-CM | POA: Insufficient documentation

## 2022-02-04 DIAGNOSIS — Z79899 Other long term (current) drug therapy: Secondary | ICD-10-CM | POA: Insufficient documentation

## 2022-02-04 DIAGNOSIS — F17210 Nicotine dependence, cigarettes, uncomplicated: Secondary | ICD-10-CM | POA: Insufficient documentation

## 2022-02-04 NOTE — Telephone Encounter (Signed)
Nutrition Follow-up:  Patient with NSCLC metastatic to bone. S/p palliative radiation 10/4. Patient is currently receiving carboplatin/paclitaxel + keytruda q21d (started 10/17).  10/28 - 11/1 hospital admission for CAP  Spoke with wife via telephone for nutrition follow-up. She reports pt has not improved much since hospital discharge.  Wife states pt is weak and tired all the time. He is not eating much. Patient is taking marinol for appetite. Wife has not seen improvement. Says it makes him loopy. Patient with ongoing altered taste and smell. She reports patient is not nauseas, but unable to eat foods if he smells them. Recalls pt wanting a country ham biscuit this morning, however could not eat it once it was in front of him. He ate a banana instead. Patient has been tolerating small portions of pinto beans the last couple of days. He is drinking one CIB with whole milk. Wife reports pt will not drink more than one. He had half of milkshake made with Ensure for supper last night.   Medications: reviewed   Labs: 10/31 labs reviewed   Anthropometrics: Per chart, last wt 153 lb 10.6 oz on 11/1 increased (? Accuracy) On 10/31 pt weighed 160 lb 0.9 oz  10/29 - 162 lb 11.2 oz 10/22 - 155 lb 9.6 oz    NUTRITION DIAGNOSIS: Unintentional weight loss - suspect this is ongoing    MALNUTRITION DIAGNOSIS: Noted severe malnutrition diagnosed by inpt RD on 01/21/22   INTERVENTION:  Continue offering small bites/sips q2h Discussed strategies for altered taste, suggested trying baking soda/salt water rinses before meals - recipe provided, handout with tips mailed Continue drinking CIB with whole milk, recommend increasing as tolerated  Suggested cold/room temperature foods, drinking liquids via straw with lid, opening windows/dining outside Take nausea medication 30 minutes prior to oral intake    MONITORING, EVALUATION, GOAL: weight trends, intake   NEXT VISIT: Will follow-up via telephone  ~one week

## 2022-02-05 DIAGNOSIS — J440 Chronic obstructive pulmonary disease with acute lower respiratory infection: Secondary | ICD-10-CM | POA: Diagnosis not present

## 2022-02-05 DIAGNOSIS — C7951 Secondary malignant neoplasm of bone: Secondary | ICD-10-CM | POA: Diagnosis not present

## 2022-02-05 DIAGNOSIS — J189 Pneumonia, unspecified organism: Secondary | ICD-10-CM | POA: Diagnosis not present

## 2022-02-05 DIAGNOSIS — J9621 Acute and chronic respiratory failure with hypoxia: Secondary | ICD-10-CM | POA: Diagnosis not present

## 2022-02-05 DIAGNOSIS — C9110 Chronic lymphocytic leukemia of B-cell type not having achieved remission: Secondary | ICD-10-CM | POA: Diagnosis not present

## 2022-02-05 DIAGNOSIS — C3492 Malignant neoplasm of unspecified part of left bronchus or lung: Secondary | ICD-10-CM | POA: Diagnosis not present

## 2022-02-06 ENCOUNTER — Inpatient Hospital Stay (HOSPITAL_BASED_OUTPATIENT_CLINIC_OR_DEPARTMENT_OTHER): Payer: Medicare Other | Admitting: Hematology

## 2022-02-06 ENCOUNTER — Encounter: Payer: Self-pay | Admitting: Hematology

## 2022-02-06 ENCOUNTER — Inpatient Hospital Stay: Payer: Medicare Other

## 2022-02-06 DIAGNOSIS — C7951 Secondary malignant neoplasm of bone: Secondary | ICD-10-CM | POA: Diagnosis not present

## 2022-02-06 DIAGNOSIS — D701 Agranulocytosis secondary to cancer chemotherapy: Secondary | ICD-10-CM | POA: Insufficient documentation

## 2022-02-06 DIAGNOSIS — Z79899 Other long term (current) drug therapy: Secondary | ICD-10-CM | POA: Insufficient documentation

## 2022-02-06 DIAGNOSIS — Z5112 Encounter for antineoplastic immunotherapy: Secondary | ICD-10-CM | POA: Diagnosis not present

## 2022-02-06 DIAGNOSIS — F17210 Nicotine dependence, cigarettes, uncomplicated: Secondary | ICD-10-CM | POA: Diagnosis not present

## 2022-02-06 DIAGNOSIS — C3492 Malignant neoplasm of unspecified part of left bronchus or lung: Secondary | ICD-10-CM

## 2022-02-06 DIAGNOSIS — C3490 Malignant neoplasm of unspecified part of unspecified bronchus or lung: Secondary | ICD-10-CM

## 2022-02-06 DIAGNOSIS — C9110 Chronic lymphocytic leukemia of B-cell type not having achieved remission: Secondary | ICD-10-CM

## 2022-02-06 DIAGNOSIS — T451X5A Adverse effect of antineoplastic and immunosuppressive drugs, initial encounter: Secondary | ICD-10-CM | POA: Diagnosis not present

## 2022-02-06 LAB — CBC WITH DIFFERENTIAL/PLATELET
Abs Immature Granulocytes: 0.8 10*3/uL — ABNORMAL HIGH (ref 0.00–0.07)
Basophils Absolute: 0 10*3/uL (ref 0.0–0.1)
Basophils Relative: 0 %
Eosinophils Absolute: 0 10*3/uL (ref 0.0–0.5)
Eosinophils Relative: 0 %
HCT: 32.5 % — ABNORMAL LOW (ref 39.0–52.0)
Hemoglobin: 10.1 g/dL — ABNORMAL LOW (ref 13.0–17.0)
Lymphocytes Relative: 76 %
Lymphs Abs: 58.1 10*3/uL — ABNORMAL HIGH (ref 0.7–4.0)
MCH: 29.6 pg (ref 26.0–34.0)
MCHC: 31.1 g/dL (ref 30.0–36.0)
MCV: 95.3 fL (ref 80.0–100.0)
Metamyelocytes Relative: 1 %
Monocytes Absolute: 2.3 10*3/uL — ABNORMAL HIGH (ref 0.1–1.0)
Monocytes Relative: 3 %
Neutro Abs: 15.3 10*3/uL — ABNORMAL HIGH (ref 1.7–7.7)
Neutrophils Relative %: 20 %
Platelets: 162 10*3/uL (ref 150–400)
RBC: 3.41 MIL/uL — ABNORMAL LOW (ref 4.22–5.81)
RDW: 21.4 % — ABNORMAL HIGH (ref 11.5–15.5)
WBC: 76.5 10*3/uL (ref 4.0–10.5)
nRBC: 0 % (ref 0.0–0.2)

## 2022-02-06 LAB — COMPREHENSIVE METABOLIC PANEL
ALT: 23 U/L (ref 0–44)
AST: 19 U/L (ref 15–41)
Albumin: 3.1 g/dL — ABNORMAL LOW (ref 3.5–5.0)
Alkaline Phosphatase: 108 U/L (ref 38–126)
Anion gap: 7 (ref 5–15)
BUN: 10 mg/dL (ref 8–23)
CO2: 26 mmol/L (ref 22–32)
Calcium: 8.6 mg/dL — ABNORMAL LOW (ref 8.9–10.3)
Chloride: 104 mmol/L (ref 98–111)
Creatinine, Ser: 0.75 mg/dL (ref 0.61–1.24)
GFR, Estimated: >60 mL/min (ref >60)
Glucose, Bld: 117 mg/dL — ABNORMAL HIGH (ref 70–99)
Potassium: 3.9 mmol/L (ref 3.5–5.1)
Sodium: 137 mmol/L (ref 135–145)
Total Bilirubin: 0.7 mg/dL (ref 0.3–1.2)
Total Protein: 5.8 g/dL — ABNORMAL LOW (ref 6.5–8.1)

## 2022-02-06 LAB — MAGNESIUM: Magnesium: 1.9 mg/dL (ref 1.7–2.4)

## 2022-02-06 MED ORDER — MEGESTROL ACETATE 400 MG/10ML PO SUSP: 400.0000 mg | Freq: Two times a day (BID) | ORAL | 3 refills | Status: DC

## 2022-02-06 MED ORDER — SODIUM CHLORIDE 0.9% FLUSH
10.0000 mL | INTRAVENOUS | Status: DC | PRN
  Administered 2022-02-06: 10 mL via INTRAVENOUS

## 2022-02-06 MED ORDER — POTASSIUM CHLORIDE IN NACL 20-0.9 MEQ/L-% IV SOLN
Freq: Once | INTRAVENOUS | Status: AC
  Filled 2022-02-06: qty 1000

## 2022-02-06 MED ORDER — NYSTATIN 100000 UNIT/ML MT SUSP: 15.0000 mL | Freq: Four times a day (QID) | OROMUCOSAL | 0 refills | Status: DC

## 2022-02-06 MED ORDER — MAGNESIUM SULFATE 2 GM/50ML IV SOLN
2.0000 g | Freq: Once | INTRAVENOUS | Status: AC
  Administered 2022-02-06: 2 g via INTRAVENOUS
  Filled 2022-02-06: qty 50

## 2022-02-06 MED ORDER — HEPARIN SOD (PORK) LOCK FLUSH 100 UNIT/ML IV SOLN
500.0000 [IU] | Freq: Once | INTRAVENOUS | Status: AC
  Administered 2022-02-06: 500 [IU] via INTRAVENOUS

## 2022-02-06 NOTE — Progress Notes (Signed)
CRITICAL VALUE ALERT Critical value received:  WBC 76.5 Date of notification:  02-06-22 Time of notification: 0835 Critical value read back:  Yes.   Nurse who received alert:  C. Kagan Mutchler RN MD notified time and response:  [phone], Dr. Delton Coombes no new issues

## 2022-02-06 NOTE — Progress Notes (Signed)
Patients port flushed without difficulty.  Good blood return noted with no bruising or swelling noted at site.  Stable during access and blood draw.  Patient to remain accessed for treatment. 
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
## 2022-02-06 NOTE — Progress Notes (Signed)
Pt presents today for treatment per provider's order. Message received from Dr. Raliegh Ip no treatment today due to still being on antibiotics for pneumonia. Pt will receive house fluids today per Dr.K.  House fludis given today per MD orders. Tolerated infusion without adverse affects. Vital signs stable. No complaints at this time. Discharged from clinic via wheelchair in stable condition. Alert and oriented x 3. F/U with [facility] as scheduled.

## 2022-02-06 NOTE — Patient Instructions (Signed)
[facility]  Discharge Instructions: Thank you for choosing [facility] to provide your oncology and hematology care.  If you have a lab appointment with the [facility], please come in thru the Main Entrance and check in at the main information desk.  Wear comfortable clothing and clothing appropriate for easy access to any Portacath or PICC line.   We strive to give you quality time with your provider. You may need to reschedule your appointment if you arrive late (15 or more minutes).  Arriving late affects you and other patients whose appointments are after yours.  Also, if you miss three or more appointments without notifying the office, you may be dismissed from the clinic at the provider's discretion.      For prescription refill requests, have your pharmacy contact our office and allow 72 hours for refills to be completed.    Today you received house fluids   BELOW ARE SYMPTOMS THAT SHOULD BE REPORTED IMMEDIATELY: *FEVER GREATER THAN 100.4 F (38 C) OR HIGHER *CHILLS OR SWEATING *NAUSEA AND VOMITING THAT IS NOT CONTROLLED WITH YOUR NAUSEA MEDICATION *UNUSUAL SHORTNESS OF BREATH *UNUSUAL BRUISING OR BLEEDING *URINARY PROBLEMS (pain or burning when urinating, or frequent urination) *BOWEL PROBLEMS (unusual diarrhea, constipation, pain near the anus) TENDERNESS IN MOUTH AND THROAT WITH OR WITHOUT PRESENCE OF ULCERS (sore throat, sores in mouth, or a toothache) UNUSUAL RASH, SWELLING OR PAIN  UNUSUAL VAGINAL DISCHARGE OR ITCHING   Items with * indicate a potential emergency and should be followed up as soon as possible or go to the Emergency Department if any problems should occur.  Please show the CHEMOTHERAPY ALERT CARD or IMMUNOTHERAPY ALERT CARD at check-in to the Emergency Department and triage nurse.  Should you have questions after your visit or need to cancel or reschedule your appointment, please contact [facility]  [phone]  and follow the prompts.  Office hours are 8:00 a.m. to 4:30 p.m. Monday - Friday. Please note that voicemails left after 4:00 p.m. may not be returned until the following business day.  We are closed weekends and major holidays. You have access to a nurse at all times for urgent questions. Please call the main number to the clinic [phone] and follow the prompts.  For any non-urgent questions, you may also contact your provider using MyChart. We now offer e-Visits for anyone 100 and older to request care online for non-urgent symptoms. For details visit [email or web address].   Also download the MyChart app! Go to the app store, search "MyChart", open the app, select [facility], and log in with your MyChart username and password.  Masks are optional in the cancer centers. If you would like for your care team to wear a mask while they are taking care of you, please let them know. You may have one support person who is at least 70 years old accompany you for your appointments.

## 2022-02-06 NOTE — Patient Instructions (Addendum)
[facility] at [facility] Discharge Instructions   You were seen and examined today by Dr. Delton Coombes.  He reviewed the results of your lab work which are normal/stable. Your white blood cell count is higher than usual due to your recent infection.  We will hold your treatment today since you are still weak and on antibiotics for the pneumonia.   Stop Marinol. We sent Megace for appetite stimulation. This is a liquid that you will take 2 teaspoons twice a day.   You also have thrush in your mouth. We will send a prescription for nystatin to your pharmacy to treat this.   We will give you IV fluids today.   We will see you back the week after Thanksgiving to try to resume treatment.    Thank you for choosing [facility] at [facility] to provide your oncology and hematology care.  To afford each patient quality time with our provider, please arrive at least 15 minutes before your scheduled appointment time.   If you have a lab appointment with the [facility] please come in thru the Main Entrance and check in at the main information desk.  You need to re-schedule your appointment should you arrive 10 or more minutes late.  We strive to give you quality time with our providers, and arriving late affects you and other patients whose appointments are after yours.  Also, if you no show three or more times for appointments you may be dismissed from the clinic at the providers discretion.     Again, thank you for choosing [facility].  Our hope is that these requests will decrease the amount of time that you wait before being seen by our physicians.       _____________________________________________________________  Should you have questions after your visit to [facility], please contact our office at [phone] and follow the prompts.  Our office hours are 8:00 a.m. and 4:30 p.m. Monday - Friday.  Please note that  voicemails left after 4:00 p.m. may not be returned until the following business day.  We are closed weekends and major holidays.  You do have access to a nurse 24-7, just call the main number to the clinic [phone] and do not press any options, hold on the line and a nurse will answer the phone.    For prescription refill requests, have your pharmacy contact our office and allow 72 hours.    Due to Covid, you will need to wear a mask upon entering the hospital. If you do not have a mask, a mask will be given to you at the Main Entrance upon arrival. For doctor visits, patients may have 1 support person age 42 or older with them. For treatment visits, patients can not have anyone with them due to social distancing guidelines and our immunocompromised population.

## 2022-02-07 ENCOUNTER — Encounter: Payer: Self-pay | Admitting: Hematology

## 2022-02-08 ENCOUNTER — Ambulatory Visit: Payer: Medicare Other

## 2022-02-08 DIAGNOSIS — J440 Chronic obstructive pulmonary disease with acute lower respiratory infection: Secondary | ICD-10-CM | POA: Diagnosis not present

## 2022-02-08 DIAGNOSIS — C7951 Secondary malignant neoplasm of bone: Secondary | ICD-10-CM | POA: Diagnosis not present

## 2022-02-08 DIAGNOSIS — C9110 Chronic lymphocytic leukemia of B-cell type not having achieved remission: Secondary | ICD-10-CM | POA: Diagnosis not present

## 2022-02-08 DIAGNOSIS — J189 Pneumonia, unspecified organism: Secondary | ICD-10-CM | POA: Diagnosis not present

## 2022-02-08 DIAGNOSIS — J9621 Acute and chronic respiratory failure with hypoxia: Secondary | ICD-10-CM | POA: Diagnosis not present

## 2022-02-08 DIAGNOSIS — C3492 Malignant neoplasm of unspecified part of left bronchus or lung: Secondary | ICD-10-CM | POA: Diagnosis not present

## 2022-02-12 DIAGNOSIS — J9621 Acute and chronic respiratory failure with hypoxia: Secondary | ICD-10-CM | POA: Diagnosis not present

## 2022-02-12 DIAGNOSIS — J440 Chronic obstructive pulmonary disease with acute lower respiratory infection: Secondary | ICD-10-CM | POA: Diagnosis not present

## 2022-02-12 DIAGNOSIS — C9110 Chronic lymphocytic leukemia of B-cell type not having achieved remission: Secondary | ICD-10-CM | POA: Diagnosis not present

## 2022-02-12 DIAGNOSIS — C7951 Secondary malignant neoplasm of bone: Secondary | ICD-10-CM | POA: Diagnosis not present

## 2022-02-12 DIAGNOSIS — C3492 Malignant neoplasm of unspecified part of left bronchus or lung: Secondary | ICD-10-CM | POA: Diagnosis not present

## 2022-02-12 DIAGNOSIS — J189 Pneumonia, unspecified organism: Secondary | ICD-10-CM | POA: Diagnosis not present

## 2022-02-14 DIAGNOSIS — C3492 Malignant neoplasm of unspecified part of left bronchus or lung: Secondary | ICD-10-CM | POA: Diagnosis not present

## 2022-02-14 DIAGNOSIS — C9110 Chronic lymphocytic leukemia of B-cell type not having achieved remission: Secondary | ICD-10-CM | POA: Diagnosis not present

## 2022-02-14 DIAGNOSIS — J440 Chronic obstructive pulmonary disease with acute lower respiratory infection: Secondary | ICD-10-CM | POA: Diagnosis not present

## 2022-02-14 DIAGNOSIS — J189 Pneumonia, unspecified organism: Secondary | ICD-10-CM | POA: Diagnosis not present

## 2022-02-14 DIAGNOSIS — J9621 Acute and chronic respiratory failure with hypoxia: Secondary | ICD-10-CM | POA: Diagnosis not present

## 2022-02-14 DIAGNOSIS — C7951 Secondary malignant neoplasm of bone: Secondary | ICD-10-CM | POA: Diagnosis not present

## 2022-02-15 DIAGNOSIS — C9110 Chronic lymphocytic leukemia of B-cell type not having achieved remission: Secondary | ICD-10-CM | POA: Diagnosis not present

## 2022-02-15 DIAGNOSIS — C7951 Secondary malignant neoplasm of bone: Secondary | ICD-10-CM | POA: Diagnosis not present

## 2022-02-15 DIAGNOSIS — C3492 Malignant neoplasm of unspecified part of left bronchus or lung: Secondary | ICD-10-CM | POA: Diagnosis not present

## 2022-02-15 DIAGNOSIS — J189 Pneumonia, unspecified organism: Secondary | ICD-10-CM | POA: Diagnosis not present

## 2022-02-15 DIAGNOSIS — J9621 Acute and chronic respiratory failure with hypoxia: Secondary | ICD-10-CM | POA: Diagnosis not present

## 2022-02-15 DIAGNOSIS — J440 Chronic obstructive pulmonary disease with acute lower respiratory infection: Secondary | ICD-10-CM | POA: Diagnosis not present

## 2022-02-18 DIAGNOSIS — C3492 Malignant neoplasm of unspecified part of left bronchus or lung: Secondary | ICD-10-CM | POA: Diagnosis not present

## 2022-02-18 DIAGNOSIS — J9621 Acute and chronic respiratory failure with hypoxia: Secondary | ICD-10-CM | POA: Diagnosis not present

## 2022-02-18 DIAGNOSIS — J440 Chronic obstructive pulmonary disease with acute lower respiratory infection: Secondary | ICD-10-CM | POA: Diagnosis not present

## 2022-02-18 DIAGNOSIS — C9110 Chronic lymphocytic leukemia of B-cell type not having achieved remission: Secondary | ICD-10-CM | POA: Diagnosis not present

## 2022-02-18 DIAGNOSIS — C7951 Secondary malignant neoplasm of bone: Secondary | ICD-10-CM | POA: Diagnosis not present

## 2022-02-18 DIAGNOSIS — J189 Pneumonia, unspecified organism: Secondary | ICD-10-CM | POA: Diagnosis not present

## 2022-02-19 DIAGNOSIS — J440 Chronic obstructive pulmonary disease with acute lower respiratory infection: Secondary | ICD-10-CM | POA: Diagnosis not present

## 2022-02-19 DIAGNOSIS — J9621 Acute and chronic respiratory failure with hypoxia: Secondary | ICD-10-CM | POA: Diagnosis not present

## 2022-02-19 DIAGNOSIS — C7951 Secondary malignant neoplasm of bone: Secondary | ICD-10-CM | POA: Diagnosis not present

## 2022-02-19 DIAGNOSIS — J189 Pneumonia, unspecified organism: Secondary | ICD-10-CM | POA: Diagnosis not present

## 2022-02-19 DIAGNOSIS — C3492 Malignant neoplasm of unspecified part of left bronchus or lung: Secondary | ICD-10-CM | POA: Diagnosis not present

## 2022-02-19 DIAGNOSIS — C9110 Chronic lymphocytic leukemia of B-cell type not having achieved remission: Secondary | ICD-10-CM | POA: Diagnosis not present

## 2022-02-20 ENCOUNTER — Encounter: Payer: Self-pay | Admitting: Gastroenterology

## 2022-02-26 DIAGNOSIS — J189 Pneumonia, unspecified organism: Secondary | ICD-10-CM | POA: Diagnosis not present

## 2022-02-26 DIAGNOSIS — C9110 Chronic lymphocytic leukemia of B-cell type not having achieved remission: Secondary | ICD-10-CM | POA: Diagnosis not present

## 2022-02-26 DIAGNOSIS — C7951 Secondary malignant neoplasm of bone: Secondary | ICD-10-CM | POA: Diagnosis not present

## 2022-02-26 DIAGNOSIS — J9621 Acute and chronic respiratory failure with hypoxia: Secondary | ICD-10-CM | POA: Diagnosis not present

## 2022-02-26 DIAGNOSIS — C3492 Malignant neoplasm of unspecified part of left bronchus or lung: Secondary | ICD-10-CM | POA: Diagnosis not present

## 2022-02-26 DIAGNOSIS — J440 Chronic obstructive pulmonary disease with acute lower respiratory infection: Secondary | ICD-10-CM | POA: Diagnosis not present

## 2022-02-27 ENCOUNTER — Inpatient Hospital Stay: Payer: Medicare Other

## 2022-02-27 ENCOUNTER — Inpatient Hospital Stay (HOSPITAL_BASED_OUTPATIENT_CLINIC_OR_DEPARTMENT_OTHER): Payer: Medicare Other | Admitting: Hematology

## 2022-02-27 VITALS — BP 112/61 | HR 83 | Temp 98.0°F | Resp 18

## 2022-02-27 DIAGNOSIS — G893 Neoplasm related pain (acute) (chronic): Secondary | ICD-10-CM | POA: Diagnosis not present

## 2022-02-27 DIAGNOSIS — Z681 Body mass index (BMI) 19 or less, adult: Secondary | ICD-10-CM

## 2022-02-27 DIAGNOSIS — F419 Anxiety disorder, unspecified: Secondary | ICD-10-CM | POA: Diagnosis present

## 2022-02-27 DIAGNOSIS — A419 Sepsis, unspecified organism: Secondary | ICD-10-CM | POA: Diagnosis not present

## 2022-02-27 DIAGNOSIS — I350 Nonrheumatic aortic (valve) stenosis: Secondary | ICD-10-CM | POA: Diagnosis present

## 2022-02-27 DIAGNOSIS — C3492 Malignant neoplasm of unspecified part of left bronchus or lung: Secondary | ICD-10-CM

## 2022-02-27 DIAGNOSIS — R5081 Fever presenting with conditions classified elsewhere: Secondary | ICD-10-CM | POA: Diagnosis present

## 2022-02-27 DIAGNOSIS — R339 Retention of urine, unspecified: Secondary | ICD-10-CM | POA: Diagnosis present

## 2022-02-27 DIAGNOSIS — R319 Hematuria, unspecified: Secondary | ICD-10-CM | POA: Diagnosis present

## 2022-02-27 DIAGNOSIS — I480 Paroxysmal atrial fibrillation: Secondary | ICD-10-CM | POA: Diagnosis not present

## 2022-02-27 DIAGNOSIS — R4182 Altered mental status, unspecified: Secondary | ICD-10-CM | POA: Diagnosis not present

## 2022-02-27 DIAGNOSIS — N3289 Other specified disorders of bladder: Secondary | ICD-10-CM | POA: Diagnosis present

## 2022-02-27 DIAGNOSIS — K5730 Diverticulosis of large intestine without perforation or abscess without bleeding: Secondary | ICD-10-CM | POA: Diagnosis not present

## 2022-02-27 DIAGNOSIS — C7951 Secondary malignant neoplasm of bone: Secondary | ICD-10-CM | POA: Diagnosis not present

## 2022-02-27 DIAGNOSIS — I351 Nonrheumatic aortic (valve) insufficiency: Secondary | ICD-10-CM | POA: Diagnosis not present

## 2022-02-27 DIAGNOSIS — J9621 Acute and chronic respiratory failure with hypoxia: Secondary | ICD-10-CM | POA: Diagnosis not present

## 2022-02-27 DIAGNOSIS — J449 Chronic obstructive pulmonary disease, unspecified: Secondary | ICD-10-CM | POA: Diagnosis not present

## 2022-02-27 DIAGNOSIS — K859 Acute pancreatitis without necrosis or infection, unspecified: Secondary | ICD-10-CM | POA: Diagnosis present

## 2022-02-27 DIAGNOSIS — I471 Supraventricular tachycardia, unspecified: Secondary | ICD-10-CM | POA: Diagnosis not present

## 2022-02-27 DIAGNOSIS — Z1152 Encounter for screening for COVID-19: Secondary | ICD-10-CM | POA: Diagnosis not present

## 2022-02-27 DIAGNOSIS — A4152 Sepsis due to Pseudomonas: Secondary | ICD-10-CM | POA: Diagnosis not present

## 2022-02-27 DIAGNOSIS — Z801 Family history of malignant neoplasm of trachea, bronchus and lung: Secondary | ICD-10-CM

## 2022-02-27 DIAGNOSIS — K219 Gastro-esophageal reflux disease without esophagitis: Secondary | ICD-10-CM | POA: Diagnosis not present

## 2022-02-27 DIAGNOSIS — E860 Dehydration: Secondary | ICD-10-CM | POA: Diagnosis present

## 2022-02-27 DIAGNOSIS — C3490 Malignant neoplasm of unspecified part of unspecified bronchus or lung: Secondary | ICD-10-CM | POA: Diagnosis not present

## 2022-02-27 DIAGNOSIS — E785 Hyperlipidemia, unspecified: Secondary | ICD-10-CM | POA: Diagnosis present

## 2022-02-27 DIAGNOSIS — D709 Neutropenia, unspecified: Secondary | ICD-10-CM | POA: Diagnosis present

## 2022-02-27 DIAGNOSIS — Z8249 Family history of ischemic heart disease and other diseases of the circulatory system: Secondary | ICD-10-CM

## 2022-02-27 DIAGNOSIS — E876 Hypokalemia: Secondary | ICD-10-CM | POA: Diagnosis not present

## 2022-02-27 DIAGNOSIS — F32A Depression, unspecified: Secondary | ICD-10-CM | POA: Diagnosis present

## 2022-02-27 DIAGNOSIS — Z515 Encounter for palliative care: Secondary | ICD-10-CM | POA: Diagnosis not present

## 2022-02-27 DIAGNOSIS — E43 Unspecified severe protein-calorie malnutrition: Secondary | ICD-10-CM | POA: Diagnosis present

## 2022-02-27 DIAGNOSIS — I2699 Other pulmonary embolism without acute cor pulmonale: Secondary | ICD-10-CM | POA: Diagnosis not present

## 2022-02-27 DIAGNOSIS — I4719 Other supraventricular tachycardia: Secondary | ICD-10-CM | POA: Diagnosis not present

## 2022-02-27 DIAGNOSIS — Z79899 Other long term (current) drug therapy: Secondary | ICD-10-CM | POA: Diagnosis not present

## 2022-02-27 DIAGNOSIS — Z452 Encounter for adjustment and management of vascular access device: Secondary | ICD-10-CM | POA: Diagnosis not present

## 2022-02-27 DIAGNOSIS — C9110 Chronic lymphocytic leukemia of B-cell type not having achieved remission: Secondary | ICD-10-CM | POA: Diagnosis not present

## 2022-02-27 DIAGNOSIS — R918 Other nonspecific abnormal finding of lung field: Secondary | ICD-10-CM | POA: Diagnosis not present

## 2022-02-27 DIAGNOSIS — K6289 Other specified diseases of anus and rectum: Secondary | ICD-10-CM | POA: Diagnosis present

## 2022-02-27 DIAGNOSIS — Z7901 Long term (current) use of anticoagulants: Secondary | ICD-10-CM

## 2022-02-27 DIAGNOSIS — Z86711 Personal history of pulmonary embolism: Secondary | ICD-10-CM

## 2022-02-27 DIAGNOSIS — Z800 Family history of malignant neoplasm of digestive organs: Secondary | ICD-10-CM

## 2022-02-27 DIAGNOSIS — R627 Adult failure to thrive: Secondary | ICD-10-CM | POA: Diagnosis present

## 2022-02-27 DIAGNOSIS — Z7189 Other specified counseling: Secondary | ICD-10-CM | POA: Diagnosis not present

## 2022-02-27 DIAGNOSIS — R000 Tachycardia, unspecified: Secondary | ICD-10-CM | POA: Diagnosis not present

## 2022-02-27 DIAGNOSIS — Z7401 Bed confinement status: Secondary | ICD-10-CM

## 2022-02-27 DIAGNOSIS — J189 Pneumonia, unspecified organism: Secondary | ICD-10-CM | POA: Diagnosis not present

## 2022-02-27 DIAGNOSIS — Z823 Family history of stroke: Secondary | ICD-10-CM

## 2022-02-27 DIAGNOSIS — B370 Candidal stomatitis: Secondary | ICD-10-CM | POA: Diagnosis not present

## 2022-02-27 DIAGNOSIS — B965 Pseudomonas (aeruginosa) (mallei) (pseudomallei) as the cause of diseases classified elsewhere: Secondary | ICD-10-CM | POA: Diagnosis not present

## 2022-02-27 DIAGNOSIS — R7881 Bacteremia: Secondary | ICD-10-CM | POA: Diagnosis not present

## 2022-02-27 DIAGNOSIS — J440 Chronic obstructive pulmonary disease with acute lower respiratory infection: Secondary | ICD-10-CM | POA: Diagnosis not present

## 2022-02-27 LAB — COMPREHENSIVE METABOLIC PANEL WITH GFR
ALT: 13 U/L (ref 0–44)
AST: 14 U/L — ABNORMAL LOW (ref 15–41)
Albumin: 3.5 g/dL (ref 3.5–5.0)
Alkaline Phosphatase: 71 U/L (ref 38–126)
Anion gap: 11 (ref 5–15)
BUN: 20 mg/dL (ref 8–23)
CO2: 20 mmol/L — ABNORMAL LOW (ref 22–32)
Calcium: 9.4 mg/dL (ref 8.9–10.3)
Chloride: 105 mmol/L (ref 98–111)
Creatinine, Ser: 0.86 mg/dL (ref 0.61–1.24)
GFR, Estimated: >60 mL/min (ref >60)
Glucose, Bld: 113 mg/dL — ABNORMAL HIGH (ref 70–99)
Potassium: 3.5 mmol/L (ref 3.5–5.1)
Sodium: 136 mmol/L (ref 135–145)
Total Bilirubin: 0.8 mg/dL (ref 0.3–1.2)
Total Protein: 6.3 g/dL — ABNORMAL LOW (ref 6.5–8.1)

## 2022-02-27 LAB — CBC WITH DIFFERENTIAL/PLATELET
Abs Immature Granulocytes: 0 10*3/uL (ref 0.00–0.07)
Basophils Absolute: 0 10*3/uL (ref 0.0–0.1)
Basophils Relative: 0 %
Eosinophils Absolute: 0 10*3/uL (ref 0.0–0.5)
Eosinophils Relative: 0 %
HCT: 34.5 % — ABNORMAL LOW (ref 39.0–52.0)
Hemoglobin: 11 g/dL — ABNORMAL LOW (ref 13.0–17.0)
Lymphocytes Relative: 99 %
Lymphs Abs: 32.5 10*3/uL — ABNORMAL HIGH (ref 0.7–4.0)
MCH: 30.5 pg (ref 26.0–34.0)
MCHC: 31.9 g/dL (ref 30.0–36.0)
MCV: 95.6 fL (ref 80.0–100.0)
Monocytes Absolute: 0.3 10*3/uL (ref 0.1–1.0)
Monocytes Relative: 1 %
Neutro Abs: 0 10*3/uL — CL (ref 1.7–7.7)
Neutrophils Relative %: 0 %
Platelets: 246 10*3/uL (ref 150–400)
RBC: 3.61 MIL/uL — ABNORMAL LOW (ref 4.22–5.81)
RDW: 17.2 % — ABNORMAL HIGH (ref 11.5–15.5)
WBC: 32.8 10*3/uL — ABNORMAL HIGH (ref 4.0–10.5)
nRBC: 0 % (ref 0.0–0.2)

## 2022-02-27 LAB — TSH: TSH: 2.915 u[IU]/mL (ref 0.350–4.500)

## 2022-02-27 LAB — FOLATE: Folate: 15.3 ng/mL (ref >5.9)

## 2022-02-27 LAB — MAGNESIUM: Magnesium: 1.7 mg/dL (ref 1.7–2.4)

## 2022-02-27 LAB — LACTATE DEHYDROGENASE: LDH: 99 U/L (ref 98–192)

## 2022-02-27 MED ORDER — FILGRASTIM-AAFI 300 MCG/0.5ML IJ SOSY
300.0000 ug | PREFILLED_SYRINGE | Freq: Once | INTRAMUSCULAR | Status: AC
  Administered 2022-02-27: 300 ug via SUBCUTANEOUS
  Filled 2022-02-27: qty 0.5

## 2022-02-27 MED ORDER — HEPARIN SOD (PORK) LOCK FLUSH 100 UNIT/ML IV SOLN
500.0000 [IU] | Freq: Once | INTRAVENOUS | Status: AC | PRN
  Administered 2022-02-27: 500 [IU]

## 2022-02-27 MED ORDER — SODIUM CHLORIDE 0.9 % IV SOLN
200.0000 mg | Freq: Once | INTRAVENOUS | Status: AC
  Administered 2022-02-27: 200 mg via INTRAVENOUS
  Filled 2022-02-27: qty 8

## 2022-02-27 MED ORDER — SODIUM CHLORIDE 0.9% FLUSH
10.0000 mL | INTRAVENOUS | Status: DC | PRN
  Administered 2022-02-27: 10 mL

## 2022-02-27 MED ORDER — SODIUM CHLORIDE 0.9 % IV SOLN: Freq: Once | INTRAVENOUS | Status: AC

## 2022-02-27 NOTE — Progress Notes (Signed)
CRITICAL VALUE ALERT Critical value received:  ANC 0.0 Date of notification:  02-27-2022 Time of notification: 09:38 am.  Critical value read back:  Yes.   Nurse who received alert:  B.Erabella Kuipers RN.  MD notified time and response:  Dr. Delton Coombes @ 09:51 am.  Orders received to give GCSF 300 mcg x 1 dose today.
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
## 2022-02-27 NOTE — Patient Instructions (Addendum)
[facility]  Discharge Instructions: Thank you for choosing [facility] to provide your oncology and hematology care.  If you have a lab appointment with the [facility], please come in thru the Main Entrance and check in at the main information desk.  Wear comfortable clothing and clothing appropriate for easy access to any Portacath or PICC line.   We strive to give you quality time with your provider. You may need to reschedule your appointment if you arrive late (15 or more minutes).  Arriving late affects you and other patients whose appointments are after yours.  Also, if you miss three or more appointments without notifying the office, you may be dismissed from the clinic at the provider's discretion.      For prescription refill requests, have your pharmacy contact our office and allow 72 hours for refills to be completed.    Today you received the following chemotherapy and/or immunotherapy agents Keytruda and GCSF, return as scheduled.   To help prevent nausea and vomiting after your treatment, we encourage you to take your nausea medication as directed.  BELOW ARE SYMPTOMS THAT SHOULD BE REPORTED IMMEDIATELY: *FEVER GREATER THAN 100.4 F (38 C) OR HIGHER *CHILLS OR SWEATING *NAUSEA AND VOMITING THAT IS NOT CONTROLLED WITH YOUR NAUSEA MEDICATION *UNUSUAL SHORTNESS OF BREATH *UNUSUAL BRUISING OR BLEEDING *URINARY PROBLEMS (pain or burning when urinating, or frequent urination) *BOWEL PROBLEMS (unusual diarrhea, constipation, pain near the anus) TENDERNESS IN MOUTH AND THROAT WITH OR WITHOUT PRESENCE OF ULCERS (sore throat, sores in mouth, or a toothache) UNUSUAL RASH, SWELLING OR PAIN  UNUSUAL VAGINAL DISCHARGE OR ITCHING   Items with * indicate a potential emergency and should be followed up as soon as possible or go to the Emergency Department if any problems should occur.  Please show the CHEMOTHERAPY ALERT CARD or IMMUNOTHERAPY ALERT CARD  at check-in to the Emergency Department and triage nurse.  Should you have questions after your visit or need to cancel or reschedule your appointment, please contact [facility] [phone]  and follow the prompts.  Office hours are 8:00 a.m. to 4:30 p.m. Monday - Friday. Please note that voicemails left after 4:00 p.m. may not be returned until the following business day.  We are closed weekends and major holidays. You have access to a nurse at all times for urgent questions. Please call the main number to the clinic [phone] and follow the prompts.  For any non-urgent questions, you may also contact your provider using MyChart. We now offer e-Visits for anyone 59 and older to request care online for non-urgent symptoms. For details visit [email or web address].   Also download the MyChart app! Go to the app store, search "MyChart", open the app, select [facility], and log in with your MyChart username and password.  Masks are optional in the cancer centers. If you would like for your care team to wear a mask while they are taking care of you, please let them know. You may have one support person who is at least 70 years old accompany you for your appointments.

## 2022-02-27 NOTE — Progress Notes (Signed)
Patient presents today for treatment. ANC 0.0, Patient and labs assessed by Dr. Delton Coombes. Patient will only receive Keytruda and NIVESTYM, 300mg  per today per Dr. Delton Coombes.  Patient tolerated therapy with no complaints voiced. Side effects with management reviewed with understanding verbalized. Port site clean and dry with no bruising or swelling noted at site. Good blood return noted before and after administration of therapy. Band aid applied.  Patient tolerated injection with no complaints voiced. Site clean and dry with no bruising or swelling noted at site. See MAR for details. Band aid applied.  Patient stable during and after injection. VSS with discharge and left in satisfactory condition with no s/s of distress noted.

## 2022-02-27 NOTE — Patient Instructions (Addendum)
Alfred Brown at [facility] Discharge Instructions   You were seen and examined today by Dr. Delton Coombes.  He reviewed the results of your lab work which are normal/stable.   He discussed with you discontinuing the chemotherapy and just giving immunotherapy Beryle Flock).  Return as scheduled.    Thank you for choosing [facility] at [facility] to provide your oncology and hematology care.  To afford each patient quality time with our provider, please arrive at least 15 minutes before your scheduled appointment time.   If you have a lab appointment with the [facility] please come in thru the Main Entrance and check in at the main information desk.  You need to re-schedule your appointment should you arrive 10 or more minutes late.  We strive to give you quality time with our providers, and arriving late affects you and other patients whose appointments are after yours.  Also, if you no show three or more times for appointments you may be dismissed from the clinic at the providers discretion.     Again, thank you for choosing [facility].  Our hope is that these requests will decrease the amount of time that you wait before being seen by our physicians.       _____________________________________________________________  Should you have questions after your visit to [facility], please contact our office at [phone] and follow the prompts.  Our office hours are 8:00 a.m. and 4:30 p.m. Monday - Friday.  Please note that voicemails left after 4:00 p.m. may not be returned until the following business day.  We are closed weekends and major holidays.  You do have access to a nurse 24-7, just call the main number to the clinic [phone] and do not press any options, hold on the line and a nurse will answer the phone.    For prescription refill requests, have your pharmacy contact our office and allow 72 hours.    Due to  Covid, you will need to wear a mask upon entering the hospital. If you do not have a mask, a mask will be given to you at the Main Entrance upon arrival. For doctor visits, patients may have 1 support person age 99 or older with them. For treatment visits, patients can not have anyone with them due to social distancing guidelines and our immunocompromised population.

## 2022-02-27 NOTE — Progress Notes (Signed)
Patient has been examined by Dr. Delton Coombes, and vital signs and labs have been reviewed. ANC, Creatinine, LFTs, hemoglobin, and platelets are within treatment parameters per M.D. - pt may proceed with treatment.  No chemo today per MD - Keytruda only. Primary RN and pharmacy notified.

## 2022-03-01 ENCOUNTER — Inpatient Hospital Stay: Payer: Medicare Other

## 2022-03-01 DIAGNOSIS — J9621 Acute and chronic respiratory failure with hypoxia: Secondary | ICD-10-CM | POA: Diagnosis not present

## 2022-03-01 DIAGNOSIS — C9110 Chronic lymphocytic leukemia of B-cell type not having achieved remission: Secondary | ICD-10-CM | POA: Diagnosis not present

## 2022-03-01 DIAGNOSIS — J440 Chronic obstructive pulmonary disease with acute lower respiratory infection: Secondary | ICD-10-CM | POA: Diagnosis not present

## 2022-03-01 DIAGNOSIS — J189 Pneumonia, unspecified organism: Secondary | ICD-10-CM | POA: Diagnosis not present

## 2022-03-01 DIAGNOSIS — C3492 Malignant neoplasm of unspecified part of left bronchus or lung: Secondary | ICD-10-CM | POA: Diagnosis not present

## 2022-03-01 DIAGNOSIS — C7951 Secondary malignant neoplasm of bone: Secondary | ICD-10-CM | POA: Diagnosis not present

## 2022-03-02 ENCOUNTER — Inpatient Hospital Stay (HOSPITAL_COMMUNITY)
Admission: EM | Admit: 2022-03-02 | Discharge: 2022-03-15 | DRG: 871 | Disposition: A | Discharge status: Home Health | Payer: Medicare Other | Attending: Internal Medicine | Admitting: Internal Medicine

## 2022-03-02 ENCOUNTER — Emergency Department (HOSPITAL_COMMUNITY): Payer: Medicare Other

## 2022-03-02 ENCOUNTER — Other Ambulatory Visit: Payer: Self-pay

## 2022-03-02 ENCOUNTER — Encounter (HOSPITAL_COMMUNITY): Payer: Self-pay

## 2022-03-02 DIAGNOSIS — Z823 Family history of stroke: Secondary | ICD-10-CM

## 2022-03-02 DIAGNOSIS — Z8249 Family history of ischemic heart disease and other diseases of the circulatory system: Secondary | ICD-10-CM

## 2022-03-02 DIAGNOSIS — R5081 Fever presenting with conditions classified elsewhere: Secondary | ICD-10-CM | POA: Diagnosis not present

## 2022-03-02 DIAGNOSIS — K219 Gastro-esophageal reflux disease without esophagitis: Secondary | ICD-10-CM | POA: Diagnosis present

## 2022-03-02 DIAGNOSIS — K859 Acute pancreatitis without necrosis or infection, unspecified: Secondary | ICD-10-CM | POA: Diagnosis present

## 2022-03-02 DIAGNOSIS — R9431 Abnormal electrocardiogram [ECG] [EKG]: Secondary | ICD-10-CM | POA: Diagnosis present

## 2022-03-02 DIAGNOSIS — I350 Nonrheumatic aortic (valve) stenosis: Secondary | ICD-10-CM

## 2022-03-02 DIAGNOSIS — Z681 Body mass index (BMI) 19 or less, adult: Secondary | ICD-10-CM

## 2022-03-02 DIAGNOSIS — E785 Hyperlipidemia, unspecified: Secondary | ICD-10-CM | POA: Diagnosis present

## 2022-03-02 DIAGNOSIS — C9110 Chronic lymphocytic leukemia of B-cell type not having achieved remission: Secondary | ICD-10-CM | POA: Diagnosis present

## 2022-03-02 DIAGNOSIS — C3492 Malignant neoplasm of unspecified part of left bronchus or lung: Secondary | ICD-10-CM | POA: Diagnosis present

## 2022-03-02 DIAGNOSIS — Z1152 Encounter for screening for COVID-19: Secondary | ICD-10-CM

## 2022-03-02 DIAGNOSIS — Z7901 Long term (current) use of anticoagulants: Secondary | ICD-10-CM

## 2022-03-02 DIAGNOSIS — C7951 Secondary malignant neoplasm of bone: Secondary | ICD-10-CM | POA: Diagnosis present

## 2022-03-02 DIAGNOSIS — Z515 Encounter for palliative care: Secondary | ICD-10-CM | POA: Diagnosis not present

## 2022-03-02 DIAGNOSIS — Z79899 Other long term (current) drug therapy: Secondary | ICD-10-CM | POA: Diagnosis not present

## 2022-03-02 DIAGNOSIS — R7881 Bacteremia: Secondary | ICD-10-CM | POA: Diagnosis not present

## 2022-03-02 DIAGNOSIS — K5730 Diverticulosis of large intestine without perforation or abscess without bleeding: Secondary | ICD-10-CM | POA: Diagnosis not present

## 2022-03-02 DIAGNOSIS — E876 Hypokalemia: Secondary | ICD-10-CM | POA: Diagnosis not present

## 2022-03-02 DIAGNOSIS — Z452 Encounter for adjustment and management of vascular access device: Secondary | ICD-10-CM | POA: Diagnosis not present

## 2022-03-02 DIAGNOSIS — R339 Retention of urine, unspecified: Secondary | ICD-10-CM | POA: Diagnosis present

## 2022-03-02 DIAGNOSIS — J449 Chronic obstructive pulmonary disease, unspecified: Secondary | ICD-10-CM | POA: Diagnosis present

## 2022-03-02 DIAGNOSIS — A4152 Sepsis due to Pseudomonas: Secondary | ICD-10-CM | POA: Diagnosis present

## 2022-03-02 DIAGNOSIS — I480 Paroxysmal atrial fibrillation: Secondary | ICD-10-CM | POA: Diagnosis present

## 2022-03-02 DIAGNOSIS — E43 Unspecified severe protein-calorie malnutrition: Secondary | ICD-10-CM | POA: Diagnosis present

## 2022-03-02 DIAGNOSIS — F419 Anxiety disorder, unspecified: Secondary | ICD-10-CM | POA: Diagnosis present

## 2022-03-02 DIAGNOSIS — Z7189 Other specified counseling: Secondary | ICD-10-CM | POA: Diagnosis not present

## 2022-03-02 DIAGNOSIS — Z95828 Presence of other vascular implants and grafts: Secondary | ICD-10-CM

## 2022-03-02 DIAGNOSIS — F32A Depression, unspecified: Secondary | ICD-10-CM | POA: Diagnosis present

## 2022-03-02 DIAGNOSIS — R319 Hematuria, unspecified: Secondary | ICD-10-CM | POA: Diagnosis present

## 2022-03-02 DIAGNOSIS — I2699 Other pulmonary embolism without acute cor pulmonale: Secondary | ICD-10-CM | POA: Diagnosis not present

## 2022-03-02 DIAGNOSIS — N3289 Other specified disorders of bladder: Secondary | ICD-10-CM | POA: Diagnosis present

## 2022-03-02 DIAGNOSIS — E860 Dehydration: Secondary | ICD-10-CM | POA: Diagnosis present

## 2022-03-02 DIAGNOSIS — B370 Candidal stomatitis: Secondary | ICD-10-CM | POA: Diagnosis present

## 2022-03-02 DIAGNOSIS — R627 Adult failure to thrive: Secondary | ICD-10-CM | POA: Diagnosis present

## 2022-03-02 DIAGNOSIS — Z7401 Bed confinement status: Secondary | ICD-10-CM

## 2022-03-02 DIAGNOSIS — R918 Other nonspecific abnormal finding of lung field: Secondary | ICD-10-CM | POA: Diagnosis not present

## 2022-03-02 DIAGNOSIS — D709 Neutropenia, unspecified: Secondary | ICD-10-CM | POA: Diagnosis present

## 2022-03-02 DIAGNOSIS — Z86711 Personal history of pulmonary embolism: Secondary | ICD-10-CM

## 2022-03-02 DIAGNOSIS — B965 Pseudomonas (aeruginosa) (mallei) (pseudomallei) as the cause of diseases classified elsewhere: Secondary | ICD-10-CM | POA: Diagnosis not present

## 2022-03-02 DIAGNOSIS — I471 Supraventricular tachycardia, unspecified: Secondary | ICD-10-CM | POA: Diagnosis not present

## 2022-03-02 DIAGNOSIS — Z800 Family history of malignant neoplasm of digestive organs: Secondary | ICD-10-CM

## 2022-03-02 DIAGNOSIS — Z801 Family history of malignant neoplasm of trachea, bronchus and lung: Secondary | ICD-10-CM

## 2022-03-02 DIAGNOSIS — G893 Neoplasm related pain (acute) (chronic): Secondary | ICD-10-CM | POA: Diagnosis present

## 2022-03-02 DIAGNOSIS — A419 Sepsis, unspecified organism: Secondary | ICD-10-CM | POA: Diagnosis not present

## 2022-03-02 DIAGNOSIS — I351 Nonrheumatic aortic (valve) insufficiency: Secondary | ICD-10-CM

## 2022-03-02 DIAGNOSIS — C3490 Malignant neoplasm of unspecified part of unspecified bronchus or lung: Secondary | ICD-10-CM | POA: Diagnosis not present

## 2022-03-02 DIAGNOSIS — I4719 Other supraventricular tachycardia: Secondary | ICD-10-CM | POA: Diagnosis not present

## 2022-03-02 DIAGNOSIS — R000 Tachycardia, unspecified: Secondary | ICD-10-CM | POA: Diagnosis not present

## 2022-03-02 DIAGNOSIS — R4182 Altered mental status, unspecified: Secondary | ICD-10-CM | POA: Diagnosis not present

## 2022-03-02 DIAGNOSIS — K6289 Other specified diseases of anus and rectum: Secondary | ICD-10-CM | POA: Diagnosis present

## 2022-03-02 LAB — TROPONIN I (HIGH SENSITIVITY)
Troponin I (High Sensitivity): 17 ng/L (ref <18)
Troponin I (High Sensitivity): 20 ng/L — ABNORMAL HIGH (ref <18)

## 2022-03-02 LAB — COMPREHENSIVE METABOLIC PANEL
ALT: 13 U/L (ref 0–44)
AST: 13 U/L — ABNORMAL LOW (ref 15–41)
Albumin: 3.2 g/dL — ABNORMAL LOW (ref 3.5–5.0)
Alkaline Phosphatase: 72 U/L (ref 38–126)
Anion gap: 13 (ref 5–15)
BUN: 30 mg/dL — ABNORMAL HIGH (ref 8–23)
CO2: 19 mmol/L — ABNORMAL LOW (ref 22–32)
Calcium: 9.2 mg/dL (ref 8.9–10.3)
Chloride: 103 mmol/L (ref 98–111)
Creatinine, Ser: 1.06 mg/dL (ref 0.61–1.24)
GFR, Estimated: >60 mL/min (ref >60)
Glucose, Bld: 118 mg/dL — ABNORMAL HIGH (ref 70–99)
Potassium: 3.7 mmol/L (ref 3.5–5.1)
Sodium: 135 mmol/L (ref 135–145)
Total Bilirubin: 1.2 mg/dL (ref 0.3–1.2)
Total Protein: 6.3 g/dL — ABNORMAL LOW (ref 6.5–8.1)

## 2022-03-02 LAB — CBC WITH DIFFERENTIAL/PLATELET
Abs Immature Granulocytes: 0 10*3/uL (ref 0.00–0.07)
Basophils Absolute: 0 10*3/uL (ref 0.0–0.1)
Basophils Relative: 0 %
Eosinophils Absolute: 0 10*3/uL (ref 0.0–0.5)
Eosinophils Relative: 0 %
HCT: 34 % — ABNORMAL LOW (ref 39.0–52.0)
Hemoglobin: 10.9 g/dL — ABNORMAL LOW (ref 13.0–17.0)
Immature Granulocytes: 0 %
Lymphocytes Relative: 100 %
Lymphs Abs: 26.2 10*3/uL — ABNORMAL HIGH (ref 0.7–4.0)
MCH: 30.2 pg (ref 26.0–34.0)
MCHC: 32.1 g/dL (ref 30.0–36.0)
MCV: 94.2 fL (ref 80.0–100.0)
Monocytes Absolute: 0.1 10*3/uL (ref 0.1–1.0)
Monocytes Relative: 0 %
Neutro Abs: 0 10*3/uL — CL (ref 1.7–7.7)
Neutrophils Relative %: 0 %
Platelets: 212 10*3/uL (ref 150–400)
RBC: 3.61 MIL/uL — ABNORMAL LOW (ref 4.22–5.81)
RDW: 16.5 % — ABNORMAL HIGH (ref 11.5–15.5)
WBC: 26.5 10*3/uL — ABNORMAL HIGH (ref 4.0–10.5)
nRBC: 0 % (ref 0.0–0.2)

## 2022-03-02 LAB — URINALYSIS, ROUTINE W REFLEX MICROSCOPIC
Bacteria, UA: NONE SEEN
Bilirubin Urine: NEGATIVE
Glucose, UA: NEGATIVE mg/dL
Hgb urine dipstick: NEGATIVE
Ketones, ur: 5 mg/dL — AB
Leukocytes,Ua: NEGATIVE
Nitrite: NEGATIVE
Protein, ur: 100 mg/dL — AB
Specific Gravity, Urine: 1.029 (ref 1.005–1.030)
pH: 5 (ref 5.0–8.0)

## 2022-03-02 LAB — METHYLMALONIC ACID, SERUM: Methylmalonic Acid, Quantitative: 111 nmol/L (ref 0–378)

## 2022-03-02 LAB — LACTIC ACID, PLASMA
Lactic Acid, Venous: 1.7 mmol/L (ref 0.5–1.9)
Lactic Acid, Venous: 3 mmol/L (ref 0.5–1.9)
Lactic Acid, Venous: 1.5 mmol/L (ref 0.5–1.9)
Lactic Acid, Venous: 0.9 mmol/L (ref 0.5–1.9)

## 2022-03-02 LAB — AMMONIA: Ammonia: <10 umol/L (ref 9–35)

## 2022-03-02 LAB — TYPE AND SCREEN
ABO/RH(D): O POS
Antibody Screen: NEGATIVE

## 2022-03-02 LAB — RESP PANEL BY RT-PCR (FLU A&B, COVID) ARPGX2
Influenza A by PCR: NEGATIVE
Influenza B by PCR: NEGATIVE
SARS Coronavirus 2 by RT PCR: NEGATIVE

## 2022-03-02 LAB — BRAIN NATRIURETIC PEPTIDE: B Natriuretic Peptide: 250 pg/mL — ABNORMAL HIGH (ref 0.0–100.0)

## 2022-03-02 LAB — MAGNESIUM: Magnesium: 1.6 mg/dL — ABNORMAL LOW (ref 1.7–2.4)

## 2022-03-02 LAB — LIPASE, BLOOD: Lipase: 72 U/L — ABNORMAL HIGH (ref 11–51)

## 2022-03-02 MED ORDER — OXYCODONE HCL 5 MG PO TABS
10.0000 mg | ORAL_TABLET | ORAL | Status: DC | PRN
  Administered 2022-03-05 – 2022-03-15 (×17): 10 mg via ORAL
  Filled 2022-03-02 (×20): qty 2

## 2022-03-02 MED ORDER — POLYETHYLENE GLYCOL 3350 17 G PO PACK: 17.0000 g | PACK | Freq: Every day | ORAL | Status: DC | PRN

## 2022-03-02 MED ORDER — ALBUTEROL SULFATE HFA 108 (90 BASE) MCG/ACT IN AERS: 1.0000 | INHALATION_SPRAY | RESPIRATORY_TRACT | Status: DC | PRN

## 2022-03-02 MED ORDER — OXYCODONE HCL ER 20 MG PO T12A
20.0000 mg | EXTENDED_RELEASE_TABLET | Freq: Two times a day (BID) | ORAL | Status: DC
  Administered 2022-03-02 – 2022-03-15 (×26): 20 mg via ORAL
  Filled 2022-03-02 (×15): qty 1
  Filled 2022-03-02: qty 2
  Filled 2022-03-02 (×10): qty 1

## 2022-03-02 MED ORDER — METOPROLOL TARTRATE 25 MG PO TABS
37.5000 mg | ORAL_TABLET | Freq: Two times a day (BID) | ORAL | Status: DC
  Administered 2022-03-02: 37.5 mg via ORAL
  Filled 2022-03-02 (×2): qty 2

## 2022-03-02 MED ORDER — ACETAMINOPHEN 325 MG PO TABS
650.0000 mg | ORAL_TABLET | Freq: Four times a day (QID) | ORAL | Status: DC | PRN
  Administered 2022-03-02 – 2022-03-10 (×2): 650 mg via ORAL
  Filled 2022-03-02 (×2): qty 2

## 2022-03-02 MED ORDER — LOPERAMIDE HCL 2 MG PO CAPS
2.0000 mg | ORAL_CAPSULE | Freq: Four times a day (QID) | ORAL | Status: DC | PRN
  Administered 2022-03-10: 2 mg via ORAL
  Filled 2022-03-02: qty 1

## 2022-03-02 MED ORDER — LACTATED RINGERS IV BOLUS (SEPSIS)
1000.0000 mL | Freq: Once | INTRAVENOUS | Status: AC
  Administered 2022-03-02: 1000 mL via INTRAVENOUS

## 2022-03-02 MED ORDER — VANCOMYCIN HCL 1500 MG/300ML IV SOLN
1500.0000 mg | Freq: Once | INTRAVENOUS | Status: AC
  Administered 2022-03-02: 1500 mg via INTRAVENOUS
  Filled 2022-03-02: qty 300

## 2022-03-02 MED ORDER — SODIUM CHLORIDE 0.9 % IV SOLN
2.0000 g | Freq: Two times a day (BID) | INTRAVENOUS | Status: DC
  Administered 2022-03-03 – 2022-03-04 (×3): 2 g via INTRAVENOUS
  Filled 2022-03-02 (×3): qty 12.5

## 2022-03-02 MED ORDER — SENNOSIDES-DOCUSATE SODIUM 8.6-50 MG PO TABS
2.0000 | ORAL_TABLET | Freq: Two times a day (BID) | ORAL | Status: DC
  Administered 2022-03-02 – 2022-03-14 (×17): 2 via ORAL
  Filled 2022-03-02 (×25): qty 2

## 2022-03-02 MED ORDER — METRONIDAZOLE 500 MG/100ML IV SOLN
500.0000 mg | Freq: Once | INTRAVENOUS | Status: AC
  Administered 2022-03-02: 500 mg via INTRAVENOUS
  Filled 2022-03-02: qty 100

## 2022-03-02 MED ORDER — PRAVASTATIN SODIUM 10 MG PO TABS
20.0000 mg | ORAL_TABLET | Freq: Every day | ORAL | Status: DC
  Administered 2022-03-03 – 2022-03-14 (×12): 20 mg via ORAL
  Filled 2022-03-02 (×12): qty 2

## 2022-03-02 MED ORDER — LACTATED RINGERS IV SOLN: INTRAVENOUS | Status: DC

## 2022-03-02 MED ORDER — SODIUM CHLORIDE 0.9 % IV SOLN
2.0000 g | Freq: Once | INTRAVENOUS | Status: AC
  Administered 2022-03-02: 2 g via INTRAVENOUS
  Filled 2022-03-02: qty 12.5

## 2022-03-02 MED ORDER — ACETAMINOPHEN 650 MG RE SUPP: 650.0000 mg | Freq: Four times a day (QID) | RECTAL | Status: DC | PRN

## 2022-03-02 MED ORDER — BUPROPION HCL ER (SR) 150 MG PO TB12
150.0000 mg | ORAL_TABLET | Freq: Every day | ORAL | Status: DC
  Administered 2022-03-03 – 2022-03-15 (×13): 150 mg via ORAL
  Filled 2022-03-02 (×13): qty 1

## 2022-03-02 MED ORDER — APIXABAN 5 MG PO TABS
5.0000 mg | ORAL_TABLET | Freq: Two times a day (BID) | ORAL | Status: DC
  Administered 2022-03-02 – 2022-03-13 (×22): 5 mg via ORAL
  Filled 2022-03-02 (×22): qty 1

## 2022-03-02 MED ORDER — VANCOMYCIN HCL 1250 MG/250ML IV SOLN
1250.0000 mg | INTRAVENOUS | Status: DC
  Administered 2022-03-03: 1250 mg via INTRAVENOUS
  Filled 2022-03-02: qty 250

## 2022-03-02 MED ORDER — MAGNESIUM SULFATE 2 GM/50ML IV SOLN
2.0000 g | Freq: Once | INTRAVENOUS | Status: AC
  Administered 2022-03-02: 2 g via INTRAVENOUS
  Filled 2022-03-02: qty 50

## 2022-03-02 MED ORDER — VALACYCLOVIR HCL 500 MG PO TABS
500.0000 mg | ORAL_TABLET | Freq: Every day | ORAL | Status: DC
  Administered 2022-03-03 – 2022-03-15 (×13): 500 mg via ORAL
  Filled 2022-03-02 (×13): qty 1

## 2022-03-02 MED ORDER — PROCHLORPERAZINE MALEATE 5 MG PO TABS
10.0000 mg | ORAL_TABLET | Freq: Four times a day (QID) | ORAL | Status: DC | PRN
  Administered 2022-03-12 – 2022-03-13 (×3): 10 mg via ORAL
  Filled 2022-03-02 (×3): qty 2

## 2022-03-02 MED ORDER — MEGESTROL ACETATE 400 MG/10ML PO SUSP
400.0000 mg | Freq: Two times a day (BID) | ORAL | Status: DC
  Administered 2022-03-02 – 2022-03-15 (×26): 400 mg via ORAL
  Filled 2022-03-02 (×26): qty 10

## 2022-03-02 MED ORDER — SODIUM CHLORIDE 0.9 % IV SOLN: 2.0000 g | Freq: Two times a day (BID) | INTRAVENOUS | Status: DC

## 2022-03-02 MED ORDER — IOHEXOL 300 MG/ML  SOLN
100.0000 mL | Freq: Once | INTRAMUSCULAR | Status: AC | PRN
  Administered 2022-03-02: 100 mL via INTRAVENOUS

## 2022-03-02 MED ORDER — TBO-FILGRASTIM 300 MCG/0.5ML ~~LOC~~ SOSY
300.0000 ug | PREFILLED_SYRINGE | Freq: Every day | SUBCUTANEOUS | Status: DC
  Administered 2022-03-03 – 2022-03-05 (×3): 300 ug via SUBCUTANEOUS
  Filled 2022-03-02 (×6): qty 0.5

## 2022-03-02 MED ORDER — ENSURE ENLIVE PO LIQD
237.0000 mL | Freq: Two times a day (BID) | ORAL | Status: DC
  Filled 2022-03-02 (×2): qty 237

## 2022-03-02 NOTE — ED Provider Notes (Signed)
Center Provider Note   CSN: [id number] Arrival date & time: 03/02/22  1325     History {Add pertinent medical, surgical, social history, OB history to HPI:1} Chief Complaint  Patient presents with  . Rectal Pain  . Hematuria    Alfred Brown is a 71 y.o. male.   Hematuria      Home Medications Prior to Admission medications   Medication Sig Start Date End Date Taking? Authorizing Provider  acetaminophen (TYLENOL) 500 MG tablet Take 500-1,000 mg by mouth every 6 (six) hours as needed for moderate pain.    [provider]  albuterol (VENTOLIN HFA) 108 (90 Base) MCG/ACT inhaler 2 puffs every 4 hours as needed if you can't catch your breath Patient taking differently: Inhale 1-2 puffs into the lungs every 4 (four) hours as needed for wheezing or shortness of breath. 12/04/21   Tanda Rockers, MD  apixaban (ELIQUIS) 5 MG TABS tablet Take 1 tablet (5 mg total) by mouth 2 (two) times daily. 02/07/22 04/08/22  Manuella Ghazi, Pratik D, DO  benzonatate (TESSALON) 100 MG capsule Take 1 capsule (100 mg total) by mouth 3 (three) times daily as needed for cough. 01/24/22   Florencia Reasons, MD  buPROPion [facility] SR) 150 MG 12 hr tablet Take 150 mg by mouth daily.    [provider]  Calcium Carb-Cholecalciferol (CALCIUM 600 + D PO) Take 1 tablet by mouth daily.    [provider]  CARBOPLATIN IV Inject into the vein every 21 ( twenty-one) days. 01/16/22   [provider]  clotrimazole-betamethasone (LOTRISONE) cream Apply 1 application  topically daily as needed (rash). 06/19/15   [provider]  dextromethorphan (DELSYM) 30 MG/5ML liquid Take 5 mLs (30 mg total) by mouth at bedtime as needed for cough. 01/24/22   Florencia Reasons, MD  diltiazem (CARDIZEM) 30 MG tablet Take 30 mg by mouth as needed (heart rate).    [provider]  feeding supplement (ENSURE ENLIVE / ENSURE PLUS) LIQD Take 237 mLs by mouth 2 (two) times daily between  meals. 01/30/22   Manuella Ghazi, Pratik D, DO  fish oil-omega-3 fatty acids 1000 MG capsule Take 1 g by mouth daily.    [provider]  fluticasone (FLONASE) 50 MCG/ACT nasal spray Place 2 sprays into the nose daily.    [provider]  lidocaine (LIDODERM) 5 % Place 1 patch onto the skin daily. Remove and Discard patch within 12 hours. Patient taking differently: Place 1 patch onto the skin daily as needed (pain). Remove and Discard patch within 12 hours. 11/28/21   Derek Jack, MD  lidocaine-prilocaine (EMLA) cream Apply a small amount to port a cath site and cover with plastic wrap 1 hour prior to infusion appointments 01/15/22   Derek Jack, MD  loperamide (IMODIUM) 2 MG capsule Take 1 capsule (2 mg total) by mouth every 6 (six) hours as needed for diarrhea or loose stools. 01/24/22   Florencia Reasons, MD  Loratadine 10 MG CAPS Take 10 mg by mouth daily.    [provider]  lovastatin (MEVACOR) 20 MG tablet Take 20 mg by mouth daily. 09/29/14   [provider]  megestrol (MEGACE) 400 MG/10ML suspension Take 10 mLs (400 mg total) by mouth 2 (two) times daily. 02/06/22   Derek Jack, MD  metoprolol tartrate (LOPRESSOR) 25 MG tablet Take 1 tablet (25 mg total) by mouth 2 (two) times daily. Patient taking differently: Take 37.5 mg by mouth 2 (two) times daily.  12/20/21   Orson Eva, MD  Multiple Vitamin (MULTIVITAMIN) tablet Take 1 tablet by mouth daily.    [provider]  niacin (NIASPAN) 500 MG CR tablet Take 500 mg by mouth daily. 09/29/14   [provider]  nystatin (MYCOSTATIN) 100000 UNIT/ML suspension Take 15 mLs (1,500,000 Units total) by mouth 4 (four) times daily. Swish and swallow 02/06/22   Derek Jack, MD  oxyCODONE (OXYCONTIN) 20 mg 12 hr tablet Take 1 tablet (20 mg total) by mouth every 12 (twelve) hours. 01/31/22   Derek Jack, MD  Oxycodone HCl 10 MG TABS Take 1 tablet (10 mg total) by mouth every 4 (four)  hours as needed. 12/26/21   Derek Jack, MD  PACLITAXEL IV Inject into the vein every 21 ( twenty-one) days. 01/16/22   [provider]  PEMBROLIZUMAB IV Inject into the vein every 21 ( twenty-one) days. 01/16/22   [provider]  polyethylene glycol (MIRALAX / GLYCOLAX) 17 g packet Take 17 g by mouth daily as needed for moderate constipation.    [provider]  prochlorperazine (COMPAZINE) 10 MG tablet Take 1 tablet (10 mg total) by mouth every 6 (six) hours as needed for nausea or vomiting. 01/15/22   Derek Jack, MD  valACYclovir (VALTREX) 500 MG tablet Take 500 mg by mouth daily.    [provider]  zolpidem (AMBIEN) 10 MG tablet Take 10 mg by mouth at bedtime as needed for sleep. 12/24/21   [provider]      Allergies    Patient has no known allergies.    Review of Systems   Review of Systems  Genitourinary:  Positive for hematuria.    Physical Exam Updated Vital Signs BP (!) 128/112 (BP Location: Right Arm)   Pulse 79   Temp 98 F (36.7 C) (Oral)   Resp 20   Ht 6\' 1"  (1.854 m)   Wt 62.1 kg   SpO2 100%   BMI 18.07 kg/m  Physical Exam  ED Results / Procedures / Treatments   Labs (all labs ordered are listed, but only abnormal results are displayed) Labs Reviewed  COMPREHENSIVE METABOLIC PANEL - Abnormal; Notable for the following components:      Result Value   CO2 19 (*)    Glucose, Bld 118 (*)    BUN 30 (*)    Total Protein 6.3 (*)    Albumin 3.2 (*)    AST 13 (*)    All other components within normal limits  CBC WITH DIFFERENTIAL/PLATELET - Abnormal; Notable for the following components:   WBC 26.5 (*)    RBC 3.61 (*)    Hemoglobin 10.9 (*)    HCT 34.0 (*)    RDW 16.5 (*)    All other components within normal limits  LIPASE, BLOOD - Abnormal; Notable for the following components:   Lipase 72 (*)    All other components within normal limits  MAGNESIUM - Abnormal; Notable for the following  components:   Magnesium 1.6 (*)    All other components within normal limits  RESP PANEL BY RT-PCR (FLU A&B, COVID) ARPGX2  CULTURE, BLOOD (ROUTINE X 2)  CULTURE, BLOOD (ROUTINE X 2)  URINE CULTURE  LACTIC ACID, PLASMA  LACTIC ACID, PLASMA  URINALYSIS, ROUTINE W REFLEX MICROSCOPIC  AMMONIA  BRAIN NATRIURETIC PEPTIDE  TYPE AND SCREEN  TROPONIN I (HIGH SENSITIVITY)    EKG None  Radiology CT Head Wo Contrast  Result Date: 03/02/2022 CLINICAL DATA:  Altered mental status.  Metastatic lung carcinoma. EXAM: CT HEAD WITHOUT CONTRAST TECHNIQUE: Contiguous axial images were obtained from the base of the skull through the vertex without intravenous contrast. RADIATION DOSE REDUCTION: This exam was performed according to the departmental dose-optimization program which includes automated exposure control, adjustment of the mA and/or kV according to patient size and/or use of iterative reconstruction technique. COMPARISON:  01/19/2022 FINDINGS: Brain: No evidence of intracranial hemorrhage, acute infarction, hydrocephalus, extra-axial collection, or mass lesion/mass effect. Stable mild diffuse cerebral and cerebellar atrophy. Vascular:  No hyperdense vessel or other acute findings. Skull: No evidence of fracture or other significant bone abnormality. Sinuses/Orbits:  No acute findings. Other: None. IMPRESSION: No acute intracranial abnormality. Stable mild diffuse cerebral and cerebellar atrophy. Electronically Signed   By: Marlaine Hind M.D.   On: 03/02/2022 14:45    Procedures Procedures  {Document cardiac monitor, telemetry assessment procedure when appropriate:1}  Medications Ordered in ED Medications - No data to display  ED Course/ Medical Decision Making/ A&P                           Medical Decision Making Amount and/or Complexity of Data Reviewed Labs: ordered. Radiology: ordered.   ***  {Document critical care time when appropriate:1} {Document review of labs and clinical  decision tools ie heart score, Chads2Vasc2 etc:1}  {Document your independent review of radiology images, and any outside records:1} {Document your discussion with family members, caretakers, and with consultants:1} {Document social determinants of health affecting pt's care:1} {Document your decision making why or why not admission, treatments were needed:1} Final Clinical Impression(s) / ED Diagnoses Final diagnoses:  None    Rx / DC Orders ED Discharge Orders     None

## 2022-03-02 NOTE — ED Provider Triage Note (Signed)
Emergency Medicine Provider Triage Evaluation Note  [place] , a 71 y.o. male  was evaluated in triage.  Pt complains of rectal pain over the past 3 days. Reports of blood in urine this AM. Patient denies any abdominal pain, nausea, vomiting, or dysuria. Wife reports he is a little more confused than usual. Denies any black or bloody stool.  Review of Systems  Positive:  Negative:   Physical Exam  BP (!) 128/112 (BP Location: Right Arm)   Pulse 79   Temp 98 F (36.7 C) (Oral)   Resp 20   Ht 6\' 1"  (1.854 m)   Wt 62.1 kg   SpO2 100%   BMI 18.07 kg/m  Gen:   Awake, no distress. Chronically ill appearing.  Resp:  Normal effort  MSK:   Moves extremities without difficulty  Other:  Dry mucous membranes. No abdominal tenderness or CVA tenderness.   Medical Decision Making  Medically screening exam initiated at 2:24 PM.  Appropriate orders placed.  Bryant Saye Hemm was informed that the remainder of the evaluation will be completed by another provider, this initial triage assessment does not replace that evaluation, and the importance of remaining in the ED until their evaluation is complete.  Labs ordered.    Sherrell Puller, PA-C 03/02/22 1425

## 2022-03-02 NOTE — ED Notes (Signed)
Bladder scan showed greater than 430ml. Provider informed. Patient unable to void.

## 2022-03-02 NOTE — Progress Notes (Signed)
Pharmacy Antibiotic Note  Alfred Brown is a 71 y.o. male admitted on 03/02/2022 with  Intra-abdominal infection .  Pharmacy has been consulted for cefepime dosing.  Plan: Cefepime 2 grams IV every 12 hours Flagyl per MD Monitor clinical progress, cultures/sensitivities, renal function, abx plan  Height: 6\' 1"  (185.4 cm) Weight: 62.1 kg (137 lb) IBW/kg (Calculated) : 79.9  Temp (24hrs), Avg:98 F (36.7 C), Min:98 F (36.7 C), Max:98 F (36.7 C)  Recent Labs  Lab 02/27/22 0821 03/02/22 1356 03/02/22 1716  WBC 32.8* 26.5*  --   CREATININE 0.86 1.06  --   LATICACIDVEN  --  1.7 3.0*    Estimated Creatinine Clearance: 57 mL/min (by C-G formula based on SCr of 1.06 mg/dL).    No Known Allergies  Antimicrobials this admission: 12/2 Cefepime >>  12/2 Flagyl >>   Dose adjustments this admission:   Microbiology results: 12/2 BCx: pending 12/2 UCx: ordered, but patient unable to provide urine sample currently     Thank you for allowing [place] to participate in this patients care. Jens Som, PharmD 03/02/2022 6:11 PM  **Pharmacist phone directory can be found on [email or web address] listed under [place]**

## 2022-03-02 NOTE — H&P (Signed)
History and Physical    Patient: Alfred Brown DOB: Feb 23, 1951 DOA: 03/02/2022 DOS: the patient was seen and examined on 03/02/2022 PCP: Redmond School, MD  Patient coming from: Home  Chief Complaint:  Chief Complaint  Patient presents with   Rectal Pain   Hematuria   HPI: Alfred Brown is a 71 y.o. male with medical history significant of CLL, lung cancer, hypertension, hyperlipidemia, COPD, cancer associated pain.  He is currently on chemotherapy.  Patient presents with rectal pain and hematuria that started yesterday and has been increasing.  He does have confusion at baseline and has been worse today.  The patient reports no fevers at home, but had fevers in the ED. Denies cough, SOB, abdominal pain.  Blood cultures obtained.  Urine culture obtained.  Chest x-ray appears normal.  Lipase slightly elevated with stranding around the pancreas.  There does appear to be a right iliac bone lytic mass.  Review of Systems: As mentioned in the history of present illness. All other systems reviewed and are negative. Past Medical History:  Diagnosis Date   Aortic valve disorder    Mild insufficiency   Chronic lymphocytic leukemia ([place])    01/2012: WBC-90.2, H&H-15.1/45.3, platelets-185 03/31/12: Verified with Dr. Tressie Stalker that no precautions or modification of medical regime are required prior to orthopaedic surgery.    CLL (chronic lymphocytic leukemia) (HCC)    CMV (cytomegalovirus) (HCC)    COPD (chronic obstructive pulmonary disease) (HCC)    Depression with anxiety    DJD (degenerative joint disease)    GERD (gastroesophageal reflux disease)    Hyperlipidemia    Hypogammaglobulinemia ([place]) 09/23/2019   Lipoma    left chest wall   Palpitations 2004   PVCs; borderline stress nuclear in 2004, negative in 2007; Echo 2007; AV-sclerotic, very mild AI   Pneumonia    Right bundle branch block    + left posterior fascicular block   Tobacco abuse    80 pack years   Past  Surgical History:  Procedure Laterality Date   COLONOSCOPY  01/31/2012   Negative screening study   GANGLION CYST EXCISION  04/02/1995   left wrist   IR IMAGING GUIDED PORT INSERTION  01/14/2022   LIPOMA EXCISION Left 10/11/2021   chest wall   ROTATOR CUFF REPAIR  04/02/1995   right   SHOULDER ARTHROSCOPY WITH SUBACROMIAL DECOMPRESSION  04/09/2012   Procedure: SHOULDER ARTHROSCOPY WITH SUBACROMIAL DECOMPRESSION;  Surgeon: Ninetta Lights, MD;  Location: [facility];  Service: Orthopedics;  Laterality: Left;  LEFT SHOULDER ARTHROSCOPY, SUBACROMIAL DECOMPRESSION, PARTIAL ACROMIOPLASTY WITH CORACOACROMIAL RELEASE, DISTAL CLAVICULECTOMY WITH ROTATOR CUFF REPAIR, DEBRIDEMENT OF LABRUM   Social History:  reports that he has quit smoking. His smoking use included cigarettes. He started smoking about 58 years ago. He has a 45.00 pack-year smoking history. He has never used smokeless tobacco. He reports that he does not drink alcohol and does not use drugs.  No Known Allergies  Family History  Problem Relation Age of Onset   Stroke Mother    Heart attack Father    Cancer Sister        colon   Colon cancer Sister    Cancer Brother        2 brothers died with lung cancer    Prior to Admission medications   Medication Sig Start Date End Date Taking? Authorizing Provider  acetaminophen (TYLENOL) 500 MG tablet Take 500-1,000 mg by mouth every 6 (six) hours as needed for moderate pain.  Yes [provider]  albuterol (VENTOLIN HFA) 108 (90 Base) MCG/ACT inhaler 2 puffs every 4 hours as needed if you can't catch your breath Patient taking differently: Inhale 1-2 puffs into the lungs every 4 (four) hours as needed for wheezing or shortness of breath. 12/04/21  Yes Tanda Rockers, MD  apixaban (ELIQUIS) 5 MG TABS tablet Take 1 tablet (5 mg total) by mouth 2 (two) times daily. 02/07/22 04/08/22 Yes Shah, Pratik D, DO  benzonatate (TESSALON) 100 MG capsule Take 1 capsule (100 mg  total) by mouth 3 (three) times daily as needed for cough. 01/24/22  Yes Florencia Reasons, MD  bu[facility] SR) 150 MG 12 hr tablet Take 150 mg by mouth daily.   Yes [provider]  Calcium Carb-Cholecalciferol (CALCIUM 600 + D PO) Take 1 tablet by mouth daily.   Yes [provider]  CARBOPLATIN IV Inject into the vein every 21 ( twenty-one) days. 01/16/22  Yes [provider]  clotrimazole-betamethasone (LOTRISONE) cream Apply 1 application  topically daily as needed (rash). 06/19/15  Yes [provider]  dextromethorphan (DELSYM) 30 MG/5ML liquid Take 5 mLs (30 mg total) by mouth at bedtime as needed for cough. 01/24/22  Yes Florencia Reasons, MD  diltiazem (CARDIZEM) 30 MG tablet Take 30 mg by mouth as needed (heart rate).   Yes [provider]  feeding supplement (ENSURE ENLIVE / ENSURE PLUS) LIQD Take 237 mLs by mouth 2 (two) times daily between meals. 01/30/22  Yes Shah, Pratik D, DO  fish oil-omega-3 fatty acids 1000 MG capsule Take 1 g by mouth daily.   Yes [provider]  fluticasone (FLONASE) 50 MCG/ACT nasal spray Place 2 sprays into the nose daily.   Yes [provider]  lidocaine (LIDODERM) 5 % Place 1 patch onto the skin daily. Remove and Discard patch within 12 hours. Patient taking differently: Place 1 patch onto the skin daily as needed (pain). Remove and Discard patch within 12 hours. 11/28/21  Yes Derek Jack, MD  lidocaine-prilocaine (EMLA) cream Apply a small amount to port a cath site and cover with plastic wrap 1 hour prior to infusion appointments 01/15/22  Yes Derek Jack, MD  loperamide (IMODIUM) 2 MG capsule Take 1 capsule (2 mg total) by mouth every 6 (six) hours as needed for diarrhea or loose stools. 01/24/22  Yes Florencia Reasons, MD  Loratadine 10 MG CAPS Take 10 mg by mouth daily.   Yes [provider]  lovastatin (MEVACOR) 20 MG tablet Take 20 mg by mouth daily. 09/29/14  Yes [provider]  megestrol (MEGACE) 400 MG/10ML suspension Take 10 mLs (400 mg total) by mouth 2 (two) times daily. 02/06/22  Yes Derek Jack, MD  metoprolol tartrate (LOPRESSOR) 25 MG tablet Take 1 tablet (25 mg total) by mouth 2 (two) times daily. Patient taking differently: Take 37.5 mg by mouth 2 (two) times daily. 12/20/21  Yes Tat, Shanon Brow, MD  Multiple Vitamin (MULTIVITAMIN) tablet Take 1 tablet by mouth daily.   Yes [provider]  niacin (NIASPAN) 500 MG CR tablet Take 500 mg by mouth daily. 09/29/14  Yes [provider]  oxyCODONE (OXYCONTIN) 20 mg 12 hr tablet Take 1 tablet (20 mg total) by mouth every 12 (twelve) hours. 01/31/22  Yes Derek Jack, MD  Oxycodone HCl 10 MG TABS Take 1 tablet (10 mg total) by mouth every 4 (four) hours as needed. 12/26/21  Yes Derek Jack, MD  PACLITAXEL IV Inject into the vein every  21 ( twenty-one) days. 01/16/22  Yes [provider]  polyethylene glycol (MIRALAX / GLYCOLAX) 17 g packet Take 17 g by mouth daily as needed for moderate constipation.   Yes [provider]  prochlorperazine (COMPAZINE) 10 MG tablet Take 1 tablet (10 mg total) by mouth every 6 (six) hours as needed for nausea or vomiting. 01/15/22  Yes Derek Jack, MD  sennosides-docusate sodium (SENOKOT-S) 8.6-50 MG tablet Take 2 tablets by mouth 2 (two) times daily.   Yes [provider]  valACYclovir (VALTREX) 500 MG tablet Take 500 mg by mouth daily.   Yes [provider]  nystatin (MYCOSTATIN) 100000 UNIT/ML suspension Take 15 mLs (1,500,000 Units total) by mouth 4 (four) times daily. Swish and swallow Patient not taking: Reported on 03/02/2022 02/06/22   Derek Jack, MD  PEMBROLIZUMAB IV Inject into the vein every 21 ( twenty-one) days. 01/16/22   [provider]  zolpidem (AMBIEN) 10 MG tablet Take 10 mg by mouth at bedtime as needed for sleep. Patient not taking: Reported on 03/02/2022 12/24/21    [provider]    Physical Exam: Vitals:   03/02/22 1820 03/02/22 1830 03/02/22 1850 03/02/22 2000  BP: 96/63 93/70  99/77  Pulse: (!) 115 (!) 109  (!) 111  Resp: 15 (!) 22 (!) 25 17  Temp:   (!) 102 F (38.9 C)   TempSrc:   Rectal   SpO2: 99% 99%  99%  Weight:      Height:       General: Elderly male who is cachectic in appearance. Awake and alert and oriented x3. No acute cardiopulmonary distress.  HEENT: Normocephalic atraumatic.  Right and left ears normal in appearance.  Pupils equal, round, reactive to light. Extraocular muscles are intact. Sclerae anicteric and noninjected.  Moist mucosal membranes. No mucosal lesions.  Neck: Neck supple without lymphadenopathy. No carotid bruits. No masses palpated.  Cardiovascular: Regular rate with normal S1-S2 sounds. No murmurs, rubs, gallops auscultated. No JVD.  Respiratory: Good respiratory effort with no wheezes, rales, rhonchi. Lungs clear to auscultation bilaterally.  No accessory muscle use. Abdomen: Soft, nontender, nondistended. Active bowel sounds. No masses or hepatosplenomegaly  Skin: No rashes, lesions, or ulcerations.  Dry, warm to touch. 2+ dorsalis pedis and radial pulses. Musculoskeletal: No calf or leg pain. All major joints not erythematous nontender.  No upper or lower joint deformation.  Good ROM.  No contractures  Psychiatric: Intact judgment and insight. Pleasant and cooperative. Neurologic: No focal neurological deficits. Strength is 5/5 and symmetric in upper and lower extremities.  Cranial nerves II through XII are grossly intact.  Data Reviewed: Results for orders placed or performed during the hospital encounter of 03/02/22 (from the past 24 hour(s))  Lactic acid, plasma     Status: None   Collection Time: 03/02/22  1:56 PM  Result Value Ref Range   Lactic Acid, Venous 1.7 0.5 - 1.9 mmol/L  Comprehensive metabolic panel     Status: Abnormal   Collection Time: 03/02/22  1:56 PM  Result Value Ref  Range   Sodium 135 135 - 145 mmol/L   Potassium 3.7 3.5 - 5.1 mmol/L   Chloride 103 98 - 111 mmol/L   CO2 19 (L) 22 - 32 mmol/L   Glucose, Bld 118 (H) 70 - 99 mg/dL   BUN 30 (H) 8 - 23 mg/dL   Creatinine, Ser 1.06 0.61 - 1.24 mg/dL   Calcium 9.2 8.9 - 10.3 mg/dL   Total Protein 6.3 (L)  6.5 - 8.1 g/dL   Albumin 3.2 (L) 3.5 - 5.0 g/dL   AST 13 (L) 15 - 41 U/L   ALT 13 0 - 44 U/L   Alkaline Phosphatase 72 38 - 126 U/L   Total Bilirubin 1.2 0.3 - 1.2 mg/dL   GFR, Estimated >60 >60 mL/min   Anion gap 13 5 - 15  CBC with Differential     Status: Abnormal   Collection Time: 03/02/22  1:56 PM  Result Value Ref Range   WBC 26.5 (H) 4.0 - 10.5 K/uL   RBC 3.61 (L) 4.22 - 5.81 MIL/uL   Hemoglobin 10.9 (L) 13.0 - 17.0 g/dL   HCT 34.0 (L) 39.0 - 52.0 %   MCV 94.2 80.0 - 100.0 fL   MCH 30.2 26.0 - 34.0 pg   MCHC 32.1 30.0 - 36.0 g/dL   RDW 16.5 (H) 11.5 - 15.5 %   Platelets 212 150 - 400 K/uL   nRBC 0.0 0.0 - 0.2 %   Neutrophils Relative % 0 %   Neutro Abs 0.0 (LL) 1.7 - 7.7 K/uL   Lymphocytes Relative 100 %   Lymphs Abs 26.2 (H) 0.7 - 4.0 K/uL   Monocytes Relative 0 %   Monocytes Absolute 0.1 0.1 - 1.0 K/uL   Eosinophils Relative 0 %   Eosinophils Absolute 0.0 0.0 - 0.5 K/uL   Basophils Relative 0 %   Basophils Absolute 0.0 0.0 - 0.1 K/uL   WBC Morphology ABSOLUTE LYMPHOCYTOSIS    Smear Review MORPHOLOGY UNREMARKABLE    Immature Granulocytes 0 %   Abs Immature Granulocytes 0.00 0.00 - 0.07 K/uL   Smudge Cells PRESENT    Ovalocytes PRESENT   Lipase, blood     Status: Abnormal   Collection Time: 03/02/22  1:56 PM  Result Value Ref Range   Lipase 72 (H) 11 - 51 U/L  Magnesium     Status: Abnormal   Collection Time: 03/02/22  1:56 PM  Result Value Ref Range   Magnesium 1.6 (L) 1.7 - 2.4 mg/dL  Ammonia     Status: None   Collection Time: 03/02/22  2:00 PM  Result Value Ref Range   Ammonia <10 9 - 35 umol/L  Type and screen [facility]     Status: None   Collection  Time: 03/02/22  2:01 PM  Result Value Ref Range   ABO/RH(D) O POS    Antibody Screen NEG    Sample Expiration      03/05/2022,2359 Performed at [facility], [address], [place], [place] [address]   Blood culture (routine x 2)     Status: None (Preliminary result)   Collection Time: 03/02/22  2:01 PM   Specimen: Blood  Result Value Ref Range   Specimen Description      RIGHT ANTECUBITAL BOTTLES DRAWN AEROBIC AND ANAEROBIC   Special Requests      Blood Culture results may not be optimal due to an excessive volume of blood received in culture bottles Performed at [facility], [address], [place], [place] [address]    Culture PENDING    Report Status PENDING   Blood culture (routine x 2)     Status: None (Preliminary result)   Collection Time: 03/02/22  2:06 PM   Specimen: Blood  Result Value Ref Range   Specimen Description      LEFT ANTECUBITAL BOTTLES DRAWN AEROBIC AND ANAEROBIC   Special Requests      Blood Culture results may not be  optimal due to an excessive volume of blood received in culture bottles Performed at [facility], [address] [place], [place] [address]    Culture PENDING    Report Status PENDING   Brain natriuretic peptide     Status: Abnormal   Collection Time: 03/02/22  2:40 PM  Result Value Ref Range   B Natriuretic Peptide 250.0 (H) 0.0 - 100.0 pg/mL  Troponin I (High Sensitivity)     Status: None   Collection Time: 03/02/22  2:40 PM  Result Value Ref Range   Troponin I (High Sensitivity) 17 <18 ng/L  Resp Panel by RT-PCR (Flu A&B, Covid) Anterior Nasal Swab     Status: None   Collection Time: 03/02/22  3:08 PM   Specimen: Anterior Nasal Swab  Result Value Ref Range   SARS Coronavirus 2 by RT PCR NEGATIVE NEGATIVE   Influenza A by PCR NEGATIVE NEGATIVE   Influenza B by PCR NEGATIVE NEGATIVE  Lactic acid, plasma     Status: Abnormal   Collection Time: 03/02/22  5:16 PM  Result Value Ref Range   Lactic Acid, Venous 3.0 (HH) 0.5 - 1.9  mmol/L  Troponin I (High Sensitivity)     Status: Abnormal   Collection Time: 03/02/22  5:16 PM  Result Value Ref Range   Troponin I (High Sensitivity) 20 (H) <18 ng/L  Lactic acid, plasma     Status: None   Collection Time: 03/02/22  6:32 PM  Result Value Ref Range   Lactic Acid, Venous 1.5 0.5 - 1.9 mmol/L  Urinalysis, Routine w reflex microscopic Urine, Catheterized     Status: Abnormal   Collection Time: 03/02/22  7:03 PM  Result Value Ref Range   Color, Urine AMBER (A) YELLOW   APPearance CLEAR CLEAR   Specific Gravity, Urine 1.029 1.005 - 1.030   pH 5.0 5.0 - 8.0   Glucose, UA NEGATIVE NEGATIVE mg/dL   Hgb urine dipstick NEGATIVE NEGATIVE   Bilirubin Urine NEGATIVE NEGATIVE   Ketones, ur 5 (A) NEGATIVE mg/dL   Protein, ur 100 (A) NEGATIVE mg/dL   Nitrite NEGATIVE NEGATIVE   Leukocytes,Ua NEGATIVE NEGATIVE   RBC / HPF 0-5 0 - 5 RBC/hpf   WBC, UA 0-5 0 - 5 WBC/hpf   Bacteria, UA NONE SEEN NONE SEEN  Lactic acid, plasma     Status: None   Collection Time: 03/02/22  8:54 PM  Result Value Ref Range   Lactic Acid, Venous 0.9 0.5 - 1.9 mmol/L   CT ABDOMEN PELVIS W CONTRAST  Result Date: 03/02/2022 CLINICAL DATA:  Lung cancer.  Sepsis.  Evaluate for metastasis. EXAM: CT ABDOMEN AND PELVIS WITH CONTRAST TECHNIQUE: Multidetector CT imaging of the abdomen and pelvis was performed using the standard protocol following bolus administration of intravenous contrast. RADIATION DOSE REDUCTION: This exam was performed according to the departmental dose-optimization program which includes automated exposure control, adjustment of the mA and/or kV according to patient size and/or use of iterative reconstruction technique. CONTRAST:  174mL OMNIPAQUE IOHEXOL 300 MG/ML  SOLN COMPARISON:  CT abdomen pelvis dated 12/18/2021. FINDINGS: Evaluation of this exam is limited due to respiratory motion artifact. Lower chest: The visualized lung bases are clear. No intra-abdominal free air or free fluid.  Hepatobiliary: The liver is unremarkable. Mild periportal edema. The gallbladder is unremarkable. Pancreas: There is mild stranding of the fat plane surrounding the pancreas most consistent with acute pancreatitis. Correlation with pancreatic enzymes recommended. No drainable fluid collection/abscess or pseudocyst. Spleen: Normal in size without  focal abnormality. Adrenals/Urinary Tract: Mild stranding of the fat plane adjacent to the adrenal glands, nonspecific, possibly related to adrenal stress. Clinical correlation is recommended. The kidneys, visualized ureters appear unremarkable. The urinary bladder is decompressed around a Foley catheter. Air within the bladder introduced via the catheter. Stomach/Bowel: There is sigmoid diverticulosis without active inflammatory changes. Moderate stool throughout the colon. There is no bowel obstruction or active inflammation. The appendix is normal. Vascular/Lymphatic: Advanced aortoiliac atherosclerotic disease. The IVC is unremarkable. No portal venous gas. Top-normal peripancreatic lymph nodes, reactive. Reproductive: Mildly enlarged prostate gland with median lobe hypertrophy. Other: None Musculoskeletal: There is an infiltrative predominantly lytic mass involving the right iliac bone adjacent to the SI joint measuring approximately 5.7 x 3.2 cm. Degenerative changes of the spine. Bilateral L4 pars defects with grade 1 L4-L5 anterolisthesis. No acute osseous pathology. IMPRESSION: 1. Acute pancreatitis.  No abscess or pseudocyst. 2. Sigmoid diverticulosis. No bowel obstruction. Normal appendix. 3. Right iliac bone infiltrative lytic mass most consistent with metastasis. 4.  Aortic Atherosclerosis (ICD10-I70.0). Electronically Signed   By: Anner Crete M.D.   On: 03/02/2022 20:19   DG Chest Port 1 View  Result Date: 03/02/2022 CLINICAL DATA:  Sepsis EXAM: PORTABLE CHEST 1 VIEW COMPARISON:  Previous studies including the examination of 01/27/2022 FINDINGS:  Cardiac size is within normal limits. There is interval improvement in the aeration in patchy infiltrates in left lung. There is small residual infiltrate in the left upper lobe. There are no new infiltrates or signs of pulmonary edema. There is no pleural effusion or pneumothorax. Anterior right fourth rib is not visualized. In the previous CT, a lytic lesion was seen in the anterior right fourth rib suggesting metastatic disease. Tip of right IJ chest port is seen in superior vena cava close to right atrium. IMPRESSION: There is almost complete clearing of patchy infiltrates in left lung with small residual infiltrate in the left upper lung fields suggesting resolving multifocal pneumonia. There are no new focal infiltrates. There is no pleural effusion or pneumothorax. There is expansile lesion in the anterior right fourth rib suggesting malignant neoplastic process. Electronically Signed   By: Elmer Picker M.D.   On: 03/02/2022 18:37   CT Head Wo Contrast  Result Date: 03/02/2022 CLINICAL DATA:  Altered mental status.  Metastatic lung carcinoma. EXAM: CT HEAD WITHOUT CONTRAST TECHNIQUE: Contiguous axial images were obtained from the base of the skull through the vertex without intravenous contrast. RADIATION DOSE REDUCTION: This exam was performed according to the departmental dose-optimization program which includes automated exposure control, adjustment of the mA and/or kV according to patient size and/or use of iterative reconstruction technique. COMPARISON:  01/19/2022 FINDINGS: Brain: No evidence of intracranial hemorrhage, acute infarction, hydrocephalus, extra-axial collection, or mass lesion/mass effect. Stable mild diffuse cerebral and cerebellar atrophy. Vascular:  No hyperdense vessel or other acute findings. Skull: No evidence of fracture or other significant bone abnormality. Sinuses/Orbits:  No acute findings. Other: None. IMPRESSION: No acute intracranial abnormality. Stable mild  diffuse cerebral and cerebellar atrophy. Electronically Signed   By: Marlaine Hind M.D.   On: 03/02/2022 14:45     Assessment and Plan: No notes have been filed under this hospital service. Service: Hospitalist  Principal Problem:   Neutropenic fever ([place]) Active Problems:   GERD (gastroesophageal reflux disease)   Chronic lymphocytic leukemia (HCC)   COPD (chronic obstructive pulmonary disease) (HCC)   Cancer associated pain   Squamous cell lung cancer, left (HCC)   Failure to thrive in  adult   Prolonged QT interval  Neutropenic fever Vancomycin, cefepime, Flagyl. Oncology recommended Granix injections daily until [place] is 1000 Check CBC daily Blood cultures obtained, urine culture obtained IV fluids CLL and squamous cell lung cancer with metastatic disease Patient has a metastatic lytic lesion in the right ilium close to the sacrum which may be the source of the patient's rectal pain Cancer associated pain Continue pain medicine Elevated lipase Patient is not particularly having any pain We will give IV fluids Recheck lipase in the morning Failure to thrive Ensure between meals COPD GERD   Advance Care Planning:   Code Status: Prior full code  Consults: None  Family Communication: Wife present during interview and exam  Severity of Illness: The appropriate patient status for this patient is INPATIENT. Inpatient status is judged to be reasonable and necessary in order to provide the required intensity of service to ensure the patient's safety. The patient's presenting symptoms, physical exam findings, and initial radiographic and laboratory data in the context of their chronic comorbidities is felt to place them at high risk for further clinical deterioration. Furthermore, it is not anticipated that the patient will be medically stable for discharge from the hospital within 2 midnights of admission.   * I certify that at the point of admission it is my clinical judgment  that the patient will require inpatient hospital care spanning beyond 2 midnights from the point of admission due to high intensity of service, high risk for further deterioration and high frequency of surveillance required.*  Author: Truett Mainland, DO 03/02/2022 9:38 PM  For on call review [email or web address].

## 2022-03-02 NOTE — Progress Notes (Signed)
Pharmacy Antibiotic Note  Alfred Brown is a 71 y.o. male admitted on 03/02/2022 with  febrile neutropenia .  Pharmacy has been consulted to add/dose  vancomycin. Patient was started on cefepime and Flagyl as well.  Plan: Vancomycin 1500 mg IV x 1 loading dose, followed by Vancomycin 1250 mg IV every 24 hours           >estAUC 541 (using TBW, Vd of 0.72, SCr 1.06) Monitor clinical progress, cultures/sensitivities, renal function, abx plan Vancomycin levels as indicated    Height: 6\' 1"  (185.4 cm) Weight: 62.1 kg (137 lb) IBW/kg (Calculated) : 79.9  Temp (24hrs), Avg:100 F (37.8 C), Min:98 F (36.7 C), Max:102 F (38.9 C)  Recent Labs  Lab 02/27/22 0821 03/02/22 1356 03/02/22 1716 03/02/22 1832 03/02/22 2054  WBC 32.8* 26.5*  --   --   --   CREATININE 0.86 1.06  --   --   --   LATICACIDVEN  --  1.7 3.0* 1.5 0.9    Estimated Creatinine Clearance: 57 mL/min (by C-G formula based on SCr of 1.06 mg/dL).    No Known Allergies  Antimicrobials this admission: 12/2 Cefepime >>  12/2 Flagyl >>  12/2 vancomycin >>  Dose adjustments this admission:   Microbiology results: 12/2 BCx: pending 12/2 UCx: sent     Thank you for allowing [place] to participate in this patients care. Jens Som, PharmD 03/02/2022 9:45 PM  **Pharmacist phone directory can be found on [email or web address] listed under [place]**

## 2022-03-02 NOTE — ED Triage Notes (Signed)
Pt presents with rectal pain and hematuria that started yesterday. Pt has a hx of stage 4 lung CA and is currently on Keytruda. Pt has been taking laxative and had a soft BM today. Pt has some confusion at baseline, but is worse today. Pt is oriented to person.

## 2022-03-02 NOTE — ED Notes (Signed)
Pt did try and give a urine sample but was not able to provide at this time

## 2022-03-02 NOTE — Progress Notes (Signed)
Pt being followed by [facility] for Sepsis protocol. 

## 2022-03-03 ENCOUNTER — Other Ambulatory Visit: Payer: Self-pay

## 2022-03-03 DIAGNOSIS — D709 Neutropenia, unspecified: Secondary | ICD-10-CM | POA: Diagnosis not present

## 2022-03-03 DIAGNOSIS — C3492 Malignant neoplasm of unspecified part of left bronchus or lung: Secondary | ICD-10-CM

## 2022-03-03 DIAGNOSIS — G893 Neoplasm related pain (acute) (chronic): Secondary | ICD-10-CM | POA: Diagnosis not present

## 2022-03-03 DIAGNOSIS — C9110 Chronic lymphocytic leukemia of B-cell type not having achieved remission: Secondary | ICD-10-CM | POA: Diagnosis not present

## 2022-03-03 DIAGNOSIS — R627 Adult failure to thrive: Secondary | ICD-10-CM

## 2022-03-03 LAB — BLOOD CULTURE ID PANEL (REFLEXED) - BCID2

## 2022-03-03 LAB — BASIC METABOLIC PANEL
Anion gap: 7 (ref 5–15)
BUN: 28 mg/dL — ABNORMAL HIGH (ref 8–23)
CO2: 21 mmol/L — ABNORMAL LOW (ref 22–32)
Calcium: 8.2 mg/dL — ABNORMAL LOW (ref 8.9–10.3)
Chloride: 106 mmol/L (ref 98–111)
Creatinine, Ser: 1 mg/dL (ref 0.61–1.24)
GFR, Estimated: >60 mL/min (ref >60)
Glucose, Bld: 150 mg/dL — ABNORMAL HIGH (ref 70–99)
Potassium: 3.7 mmol/L (ref 3.5–5.1)
Sodium: 134 mmol/L — ABNORMAL LOW (ref 135–145)

## 2022-03-03 LAB — LIPASE, BLOOD: Lipase: 25 U/L (ref 11–51)

## 2022-03-03 MED ORDER — METOPROLOL TARTRATE 25 MG PO TABS
37.5000 mg | ORAL_TABLET | Freq: Once | ORAL | Status: AC
  Administered 2022-03-03: 37.5 mg via ORAL
  Filled 2022-03-03: qty 2

## 2022-03-03 MED ORDER — METOPROLOL TARTRATE 25 MG PO TABS
37.5000 mg | ORAL_TABLET | Freq: Two times a day (BID) | ORAL | Status: DC
  Administered 2022-03-03 – 2022-03-07 (×7): 37.5 mg via ORAL
  Filled 2022-03-03 (×8): qty 2

## 2022-03-03 MED ORDER — METOPROLOL TARTRATE 5 MG/5ML IV SOLN
5.0000 mg | Freq: Once | INTRAVENOUS | Status: AC
  Administered 2022-03-03: 5 mg via INTRAVENOUS
  Filled 2022-03-03: qty 5

## 2022-03-03 MED ORDER — SODIUM CHLORIDE 0.9 % IV BOLUS
1000.0000 mL | Freq: Once | INTRAVENOUS | Status: AC
  Administered 2022-03-03: 1000 mL via INTRAVENOUS

## 2022-03-03 MED ORDER — SODIUM CHLORIDE 0.9 % IV SOLN: INTRAVENOUS | Status: DC

## 2022-03-03 NOTE — Progress Notes (Signed)
   03/03/22 1605  Assess: MEWS Score  Temp 98.7 F (37.1 C)  BP (!) 105/58  MAP (mmHg) 73  Pulse Rate (!) 123  Resp 20  SpO2 100 %  Assess: MEWS Score  MEWS Temp 0  MEWS Systolic 0  MEWS Pulse 2  MEWS RR 0  MEWS LOC 0  MEWS Score 2  MEWS Score Color Yellow  Assess: if the MEWS score is Yellow or Red  Were vital signs taken at a resting state? Yes  Focused Assessment Change from prior assessment (see assessment flowsheet)  Does the patient meet 2 or more of the SIRS criteria? Yes  Treat  Pain Scale 0-10  Pain Score 0  Take Vital Signs  Increase Vital Sign Frequency  Yellow: Q 2hr X 2 then Q 4hr X 2, if remains yellow, continue Q 4hrs  Escalate  MEWS: Escalate Yellow: discuss with charge nurse/RN and consider discussing with provider and RRT  Notify: Charge Nurse/RN  Name of Charge Nurse/RN Notified [facility] RN  Date Charge Nurse/RN Notified 03/03/22  Time Charge Nurse/RN Notified 67  Provider Notification  Provider Name/Title Courage MD  Date Provider Notified 03/03/22  Time Provider Notified 1645  Method of Notification Call  Notification Reason Other (Comment) (Yellow MEWs)  Provider response No new orders  Assess: SIRS CRITERIA  SIRS Temperature  0  SIRS Pulse 1  SIRS Respirations  0  SIRS WBC 1  SIRS Score Sum  2

## 2022-03-03 NOTE — Progress Notes (Signed)
Noted patients heart rate in the 130s , blood pressure 100/59 at 1425. Patient verbalizing no complaints. MD Starla Link made aware. New orders placed give Metoprolol PO. Patient given medication at 1442. EKG obtained showing A-fib with RVR rate 136. MD Starla Link and Courage made aware. New orders placed give bolus then IV Metoprolol. Bolus given at 1509, IV metoprolol given at 1607. Vital signs as of 1655: T-98.2, P-92/56, R-20, P-112, O2-99% at Room air. Patient eating chips with wife at beside. MD Courage updated with pt condition.

## 2022-03-03 NOTE — Progress Notes (Signed)
Attempted to get report from ED x 1,nurse in with another patient.

## 2022-03-03 NOTE — Progress Notes (Signed)
   03/03/22 1810  Assess: MEWS Score  Temp 97.7 F (36.5 C)  BP 94/61  MAP (mmHg) 72  Pulse Rate 93  Resp 18  SpO2 100 %  O2 Device Room Air  Assess: MEWS Score  MEWS Temp 0  MEWS Systolic 1  MEWS Pulse 0  MEWS RR 0  MEWS LOC 0  MEWS Score 1  MEWS Score Color Green  Notify: Charge Nurse/RN  Name of Charge Nurse/RN Notified Mary RN  Date Charge Nurse/RN Notified 03/03/22  Time Charge Nurse/RN Notified [place]  Provider Notification  Provider Name/Title Courage MD and Starla Link MD  Date Provider Notified 03/03/22  Time Provider Notified 1825  Method of Notification Page (Secure chat)  Assess: SIRS CRITERIA  SIRS Temperature  0  SIRS Pulse 1  SIRS Respirations  0  SIRS WBC 1  SIRS Score Sum  2

## 2022-03-03 NOTE — Progress Notes (Signed)
   03/03/22 1442  Assess: MEWS Score  BP (!) 100/59  Pulse Rate (!) 130  Resp 20  Assess: MEWS Score  MEWS Temp 0  MEWS Systolic 1  MEWS Pulse 3  MEWS RR 0  MEWS LOC 0  MEWS Score 4  MEWS Score Color Red  Assess: if the MEWS score is Yellow or Red  Were vital signs taken at a resting state? Yes  Focused Assessment Change from prior assessment (see assessment flowsheet)  Does the patient meet 2 or more of the SIRS criteria? Yes  Treat  Pain Scale 0-10  Take Vital Signs  Increase Vital Sign Frequency  Red: Q 1hr X 4 then Q 4hr X 4, if remains red, continue Q 4hrs  Escalate  MEWS: Escalate Red: discuss with charge nurse/RN and provider, consider discussing with RRT  Notify: Charge Nurse/RN  Name of Charge Nurse/RN Notified [facility] RN  Date Charge Nurse/RN Notified 03/03/22  Time Charge Nurse/RN Notified 1442  Provider Notification  Provider Name/Title Aline August, MD  Date Provider Notified 03/03/22  Time Provider Notified 1445  Method of Notification Page (Secure chat)  Notification Reason Other (Comment) (Red MEWs)  Provider response See new orders  Assess: SIRS CRITERIA  SIRS Temperature  0  SIRS Pulse 1  SIRS Respirations  0  SIRS WBC 1  SIRS Score Sum  2

## 2022-03-03 NOTE — Progress Notes (Signed)
PROGRESS NOTE    [facility]  LPF:[id number] DOB: [birth year] DOA: 03/02/2022 PCP: Redmond School, MD   Brief Narrative:  71 y.o. male with medical history significant of CLL, stage IV lung cancer with bony metastases currently on chemotherapy and has received radiation, hypertension, hyperlipidemia, COPD presented with rectal pain and hematuria with increasing confusion.  On presentation, he was found to be neutropenic.  CT of abdomen and pelvis with contrast showed acute pancreatitis but no abscess or pseudocyst along with right iliac bone infiltrative lytic masses consistent with metastases.  CT of the head without contrast was negative for any acute intracranial abnormality.  He was started on IV fluids, antibiotics and Granix.  Assessment & Plan:   Neutropenic fever -Presented with neutrophils of 0.  Currently on Granix: EDP discussed with Dr. Delton Coombes who recommended to continue Granix till [facility] is more than 1000.  Follow Dr. Tomie China recommendations.  Currently on broad-spectrum antibiotics.  Follow cultures.  Imaging study as above  Stage IV lung cancer with bony metastasis CLL Cancer associated pain -Follows up with Dr. Delton Coombes as an outpatient.  Has received radiation treatment recently.  Also undergoing chemotherapy.  Follow further recommendations from oncology. -Might need palliative care involvement as well. -Currently on scheduled OxyContin along with as needed oxycodone.  Elevated lipase -Imaging studies showed acute pancreatitis but clinically patient does not have any symptoms or signs of pancreatitis with benign abdominal exam.  Failure to thrive -Nutrition consult.  Continue Megace.  Hypertension -Blood pressure on the lower side.  Hold metoprolol if blood pressure is less than 110.  Hyperlipidemia -Continue statin  Depression -Continue bupropion  Possible PE -Patient was empirically started on Eliquis during his last most recent hospitalization from  01/26/2022-01/30/2022 as CT angiogram could not rule out pulmonary embolism -Continue Eliquis  Physical deconditioning -PT eval   DVT prophylaxis: Eliquis Code Status: Full Family Communication: Wife at bedside Disposition Plan: Status is: Inpatient Remains inpatient appropriate because: Of severity of illness   Consultants: Consult oncology  Procedures: None  Antimicrobials: Vancomycin cefepime and Flagyl from 03/02/2022 onwards   Subjective: Patient seen and examined at bedside.  Sleepy, wakes up slightly.  Wife at bedside.  States that patient slept better last night.  No fever, vomiting, chest pain reported.  Objective: Vitals:   03/02/22 2330 03/03/22 0030 03/03/22 0100 03/03/22 0559  BP:  90/66 (!) 84/55 94/65  Pulse: 100 (!) 101 95 94  Resp: (!) 23 19 19 19   Temp: 98.1 F (36.7 C) 98 F (36.7 C) 98 F (36.7 C) (!) 97.4 F (36.3 C)  TempSrc: Oral Oral  Oral  SpO2: 100% 99% 99% 98%  Weight:   64.3 kg   Height:   6\' 1"  (1.854 m)     Intake/Output Summary (Last 24 hours) at 03/03/2022 0752 Last data filed at 03/02/2022 2220 Gross per 24 hour  Intake 2250.03 ml  Output 300 ml  Net 1950.03 ml   Filed Weights   03/02/22 1348 03/03/22 0100  Weight: 62.1 kg 64.3 kg    Examination:  General exam: Appears calm and comfortable.  On room air.  Currently looks chronically ill and deconditioned. Respiratory system: Bilateral decreased breath sounds at bases with some scattered crackles Cardiovascular system: S1 & S2 heard, mild intermittent tachycardia present Gastrointestinal system: Abdomen is nondistended, soft and nontender. Normal bowel sounds heard. Extremities: No cyanosis, clubbing, edema  Central nervous system: Sleepy, wakes up slightly, poor historian.  No focal neurological deficits. Moving extremities Skin: No  rashes, lesions or ulcers Psychiatry: Flat affect.  No signs of agitation.  Data Reviewed: I have personally reviewed following labs and  imaging studies  CBC: Recent Labs  Lab 02/27/22 0821 03/02/22 1356 03/03/22 0612  WBC 32.8* 26.5* 6.2  NEUTROABS 0.0* 0.0*  --   HGB 11.0* 10.9* 8.5*  HCT 34.5* 34.0* 26.0*  MCV 95.6 94.2 94.2  PLT 246 212 741*   Basic Metabolic Panel: Recent Labs  Lab 02/27/22 0821 03/02/22 1356 03/03/22 0612  NA 136 135 134*  K 3.5 3.7 3.7  CL 105 103 106  CO2 20* 19* 21*  GLUCOSE 113* 118* 150*  BUN 20 30* 28*  CREATININE 0.86 1.06 1.00  CALCIUM 9.4 9.2 8.2*  MG 1.7 1.6*  --    GFR: Estimated Creatinine Clearance: 62.5 mL/min (by C-G formula based on SCr of 1 mg/dL). Liver Function Tests: Recent Labs  Lab 02/27/22 0821 03/02/22 1356  AST 14* 13*  ALT 13 13  ALKPHOS 71 72  BILITOT 0.8 1.2  PROT 6.3* 6.3*  ALBUMIN 3.5 3.2*   Recent Labs  Lab 03/02/22 1356 03/03/22 0612  LIPASE 72* 25   Recent Labs  Lab 03/02/22 1400  AMMONIA <10   Coagulation Profile: No results for input(s): "INR", "PROTIME" in the last 168 hours. Cardiac Enzymes: No results for input(s): "CKTOTAL", "CKMB", "CKMBINDEX", "TROPONINI" in the last 168 hours. BNP (last 3 results) No results for input(s): "PROBNP" in the last 8760 hours. HbA1C: No results for input(s): "HGBA1C" in the last 72 hours. CBG: No results for input(s): "GLUCAP" in the last 168 hours. Lipid Profile: No results for input(s): "CHOL", "HDL", "LDLCALC", "TRIG", "CHOLHDL", "LDLDIRECT" in the last 72 hours. Thyroid Function Tests: No results for input(s): "TSH", "T4TOTAL", "FREET4", "T3FREE", "THYROIDAB" in the last 72 hours. Anemia Panel: No results for input(s): "VITAMINB12", "FOLATE", "FERRITIN", "TIBC", "IRON", "RETICCTPCT" in the last 72 hours. Sepsis Labs: Recent Labs  Lab 03/02/22 1356 03/02/22 1716 03/02/22 1832 03/02/22 2054  LATICACIDVEN 1.7 3.0* 1.5 0.9    Recent Results (from the past 240 hour(s))  Blood culture (routine x 2)     Status: None (Preliminary result)   Collection Time: 03/02/22  2:01 PM    Specimen: Right Antecubital; Blood  Result Value Ref Range Status   Specimen Description   Final    RIGHT ANTECUBITAL BOTTLES DRAWN AEROBIC AND ANAEROBIC   Special Requests   Final    Blood Culture results may not be optimal due to an excessive volume of blood received in culture bottles   Culture   Final    NO GROWTH < 12 HOURS Performed at [facility], [address] [place], [place] [address]    Report Status PENDING  Incomplete  Blood culture (routine x 2)     Status: None (Preliminary result)   Collection Time: 03/02/22  2:06 PM   Specimen: Left Antecubital; Blood  Result Value Ref Range Status   Specimen Description   Final    LEFT ANTECUBITAL BOTTLES DRAWN AEROBIC AND ANAEROBIC   Special Requests   Final    Blood Culture results may not be optimal due to an excessive volume of blood received in culture bottles   Culture   Final    NO GROWTH < 12 HOURS Performed at [facility], [address] [place],  [address]    Report Status PENDING  Incomplete  Resp Panel by RT-PCR (Flu A&B, Covid) Anterior Nasal Swab     Status: None   Collection  Time: 03/02/22  3:08 PM   Specimen: Anterior Nasal Swab  Result Value Ref Range Status   SARS Coronavirus 2 by RT PCR NEGATIVE NEGATIVE Final    Comment: (NOTE) SARS-CoV-2 target nucleic acids are NOT DETECTED.  The SARS-CoV-2 RNA is generally detectable in upper respiratory specimens during the acute phase of infection. The lowest concentration of SARS-CoV-2 viral copies this assay can detect is 138 copies/mL. A negative result does not preclude SARS-Cov-2 infection and should not be used as the sole basis for treatment or other patient management decisions. A negative result may occur with  improper specimen collection/handling, submission of specimen other than nasopharyngeal swab, presence of viral mutation(s) within the areas targeted by this assay, and inadequate number of viral copies(<138 copies/mL). A negative result  must be combined with clinical observations, patient history, and epidemiological information. The expected result is Negative.  Fact Sheet for Patients:  EntrepreneurPulse.com.au  Fact Sheet for Healthcare Providers:  IncredibleEmployment.be  This test is no t yet approved or cleared by the [place] FDA and  has been authorized for detection and/or diagnosis of SARS-CoV-2 by FDA under an Emergency Use Authorization (EUA). This EUA will remain  in effect (meaning this test can be used) for the duration of the COVID-19 declaration under Section 564(b)(1) of the Act, 21 U.S.C.section 360bbb-3(b)(1), unless the authorization is terminated  or revoked sooner.       Influenza A by PCR NEGATIVE NEGATIVE Final   Influenza B by PCR NEGATIVE NEGATIVE Final    Comment: (NOTE) The Xpert Xpress SARS-CoV-2/FLU/RSV plus assay is intended as an aid in the diagnosis of influenza from Nasopharyngeal swab specimens and should not be used as a sole basis for treatment. Nasal washings and aspirates are unacceptable for Xpert Xpress SARS-CoV-2/FLU/RSV testing.  Fact Sheet for Patients: EntrepreneurPulse.com.au  Fact Sheet for Healthcare Providers: IncredibleEmployment.be  This test is not yet approved or cleared by the [place] FDA and has been authorized for detection and/or diagnosis of SARS-CoV-2 by FDA under an Emergency Use Authorization (EUA). This EUA will remain in effect (meaning this test can be used) for the duration of the COVID-19 declaration under Section 564(b)(1) of the Act, 21 U.S.C. section 360bbb-3(b)(1), unless the authorization is terminated or revoked.  Performed at [facility], [address] [place], [place] [address]          Radiology Studies: CT ABDOMEN PELVIS W CONTRAST  Result Date: 03/02/2022 CLINICAL DATA:  Lung cancer.  Sepsis.  Evaluate for metastasis. EXAM: CT ABDOMEN  AND PELVIS WITH CONTRAST TECHNIQUE: Multidetector CT imaging of the abdomen and pelvis was performed using the standard protocol following bolus administration of intravenous contrast. RADIATION DOSE REDUCTION: This exam was performed according to the departmental dose-optimization program which includes automated exposure control, adjustment of the mA and/or kV according to patient size and/or use of iterative reconstruction technique. CONTRAST:  119mL OMNIPAQUE IOHEXOL 300 MG/ML  SOLN COMPARISON:  CT abdomen pelvis dated 12/18/2021. FINDINGS: Evaluation of this exam is limited due to respiratory motion artifact. Lower chest: The visualized lung bases are clear. No intra-abdominal free air or free fluid. Hepatobiliary: The liver is unremarkable. Mild periportal edema. The gallbladder is unremarkable. Pancreas: There is mild stranding of the fat plane surrounding the pancreas most consistent with acute pancreatitis. Correlation with pancreatic enzymes recommended. No drainable fluid collection/abscess or pseudocyst. Spleen: Normal in size without focal abnormality. Adrenals/Urinary Tract: Mild stranding of the fat plane adjacent to the adrenal glands, nonspecific, possibly related to  adrenal stress. Clinical correlation is recommended. The kidneys, visualized ureters appear unremarkable. The urinary bladder is decompressed around a Foley catheter. Air within the bladder introduced via the catheter. Stomach/Bowel: There is sigmoid diverticulosis without active inflammatory changes. Moderate stool throughout the colon. There is no bowel obstruction or active inflammation. The appendix is normal. Vascular/Lymphatic: Advanced aortoiliac atherosclerotic disease. The IVC is unremarkable. No portal venous gas. Top-normal peripancreatic lymph nodes, reactive. Reproductive: Mildly enlarged prostate gland with median lobe hypertrophy. Other: None Musculoskeletal: There is an infiltrative predominantly lytic mass involving the  right iliac bone adjacent to the SI joint measuring approximately 5.7 x 3.2 cm. Degenerative changes of the spine. Bilateral L4 pars defects with grade 1 L4-L5 anterolisthesis. No acute osseous pathology. IMPRESSION: 1. Acute pancreatitis.  No abscess or pseudocyst. 2. Sigmoid diverticulosis. No bowel obstruction. Normal appendix. 3. Right iliac bone infiltrative lytic mass most consistent with metastasis. 4.  Aortic Atherosclerosis (ICD10-I70.0). Electronically Signed   By: Anner Crete M.D.   On: 03/02/2022 20:19   DG Chest Port 1 View  Result Date: 03/02/2022 CLINICAL DATA:  Sepsis EXAM: PORTABLE CHEST 1 VIEW COMPARISON:  Previous studies including the examination of 01/27/2022 FINDINGS: Cardiac size is within normal limits. There is interval improvement in the aeration in patchy infiltrates in left lung. There is small residual infiltrate in the left upper lobe. There are no new infiltrates or signs of pulmonary edema. There is no pleural effusion or pneumothorax. Anterior right fourth rib is not visualized. In the previous CT, a lytic lesion was seen in the anterior right fourth rib suggesting metastatic disease. Tip of right IJ chest port is seen in superior vena cava close to right atrium. IMPRESSION: There is almost complete clearing of patchy infiltrates in left lung with small residual infiltrate in the left upper lung fields suggesting resolving multifocal pneumonia. There are no new focal infiltrates. There is no pleural effusion or pneumothorax. There is expansile lesion in the anterior right fourth rib suggesting malignant neoplastic process. Electronically Signed   By: Elmer Picker M.D.   On: 03/02/2022 18:37   CT Head Wo Contrast  Result Date: 03/02/2022 CLINICAL DATA:  Altered mental status.  Metastatic lung carcinoma. EXAM: CT HEAD WITHOUT CONTRAST TECHNIQUE: Contiguous axial images were obtained from the base of the skull through the vertex without intravenous contrast.  RADIATION DOSE REDUCTION: This exam was performed according to the departmental dose-optimization program which includes automated exposure control, adjustment of the mA and/or kV according to patient size and/or use of iterative reconstruction technique. COMPARISON:  01/19/2022 FINDINGS: Brain: No evidence of intracranial hemorrhage, acute infarction, hydrocephalus, extra-axial collection, or mass lesion/mass effect. Stable mild diffuse cerebral and cerebellar atrophy. Vascular:  No hyperdense vessel or other acute findings. Skull: No evidence of fracture or other significant bone abnormality. Sinuses/Orbits:  No acute findings. Other: None. IMPRESSION: No acute intracranial abnormality. Stable mild diffuse cerebral and cerebellar atrophy. Electronically Signed   By: Marlaine Hind M.D.   On: 03/02/2022 14:45        Scheduled Meds:  apixaban  5 mg Oral BID   buPROPion  150 mg Oral Daily   feeding supplement  237 mL Oral BID BM   megestrol  400 mg Oral BID   metoprolol tartrate  37.5 mg Oral BID   oxyCODONE  20 mg Oral Q12H   pravastatin  20 mg Oral q1800   senna-docusate  2 tablet Oral BID   Tbo-filgastrim (GRANIX) SQ  300 mcg Subcutaneous q1800  valACYclovir  500 mg Oral Daily   Continuous Infusions:  ceFEPime (MAXIPIME) IV 2 g (03/03/22 0515)   lactated ringers 150 mL/hr at 03/03/22 0323   vancomycin            Aline August, MD [facility] 03/03/2022, 7:52 AM

## 2022-03-03 NOTE — Progress Notes (Signed)
Lab called blood cultures come back two bottles positive with gram negative rods. Microbiology lab called 2/4 blood cultures come back with Pseudomonas Aeruginosa Aerobic bottles. MD Alekh aware. No new orders.

## 2022-03-04 ENCOUNTER — Ambulatory Visit: Payer: Medicare Other | Admitting: Internal Medicine

## 2022-03-04 DIAGNOSIS — D709 Neutropenia, unspecified: Secondary | ICD-10-CM | POA: Diagnosis not present

## 2022-03-04 DIAGNOSIS — C3492 Malignant neoplasm of unspecified part of left bronchus or lung: Secondary | ICD-10-CM | POA: Diagnosis not present

## 2022-03-04 DIAGNOSIS — C9110 Chronic lymphocytic leukemia of B-cell type not having achieved remission: Secondary | ICD-10-CM | POA: Diagnosis not present

## 2022-03-04 DIAGNOSIS — G893 Neoplasm related pain (acute) (chronic): Secondary | ICD-10-CM | POA: Diagnosis not present

## 2022-03-04 DIAGNOSIS — R7881 Bacteremia: Secondary | ICD-10-CM | POA: Insufficient documentation

## 2022-03-04 DIAGNOSIS — R5081 Fever presenting with conditions classified elsewhere: Secondary | ICD-10-CM | POA: Diagnosis not present

## 2022-03-04 LAB — CBC
HCT: 26 % — ABNORMAL LOW (ref 39.0–52.0)
HCT: 25 % — ABNORMAL LOW (ref 39.0–52.0)
Hemoglobin: 8.5 g/dL — ABNORMAL LOW (ref 13.0–17.0)
Hemoglobin: 8 g/dL — ABNORMAL LOW (ref 13.0–17.0)
MCH: 30.8 pg (ref 26.0–34.0)
MCH: 30.4 pg (ref 26.0–34.0)
MCHC: 32.7 g/dL (ref 30.0–36.0)
MCHC: 32 g/dL (ref 30.0–36.0)
MCV: 94.2 fL (ref 80.0–100.0)
MCV: 95.1 fL (ref 80.0–100.0)
Platelets: 111 10*3/uL — ABNORMAL LOW (ref 150–400)
Platelets: 106 10*3/uL — ABNORMAL LOW (ref 150–400)
RBC: 2.76 MIL/uL — ABNORMAL LOW (ref 4.22–5.81)
RBC: 2.63 MIL/uL — ABNORMAL LOW (ref 4.22–5.81)
RDW: 16.7 % — ABNORMAL HIGH (ref 11.5–15.5)
RDW: 16.8 % — ABNORMAL HIGH (ref 11.5–15.5)
WBC: 6.2 10*3/uL (ref 4.0–10.5)
WBC: 5.9 10*3/uL (ref 4.0–10.5)
nRBC: 0 % (ref 0.0–0.2)
nRBC: 0 % (ref 0.0–0.2)

## 2022-03-04 LAB — URINE CULTURE: Culture: NO GROWTH

## 2022-03-04 LAB — COMPREHENSIVE METABOLIC PANEL
ALT: 17 U/L (ref 0–44)
AST: 16 U/L (ref 15–41)
Albumin: 2.1 g/dL — ABNORMAL LOW (ref 3.5–5.0)
Alkaline Phosphatase: 50 U/L (ref 38–126)
Anion gap: 5 (ref 5–15)
BUN: 20 mg/dL (ref 8–23)
CO2: 22 mmol/L (ref 22–32)
Calcium: 8.1 mg/dL — ABNORMAL LOW (ref 8.9–10.3)
Chloride: 108 mmol/L (ref 98–111)
Creatinine, Ser: 0.7 mg/dL (ref 0.61–1.24)
GFR, Estimated: >60 mL/min (ref >60)
Glucose, Bld: 108 mg/dL — ABNORMAL HIGH (ref 70–99)
Potassium: 2.8 mmol/L — ABNORMAL LOW (ref 3.5–5.1)
Sodium: 135 mmol/L (ref 135–145)
Total Bilirubin: 0.5 mg/dL (ref 0.3–1.2)
Total Protein: 4.4 g/dL — ABNORMAL LOW (ref 6.5–8.1)

## 2022-03-04 LAB — MAGNESIUM: Magnesium: 1.7 mg/dL (ref 1.7–2.4)

## 2022-03-04 MED ORDER — GUAIFENESIN-DM 100-10 MG/5ML PO SYRP
5.0000 mL | ORAL_SOLUTION | ORAL | Status: DC | PRN
  Administered 2022-03-04 (×2): 5 mL via ORAL
  Filled 2022-03-04 (×2): qty 5

## 2022-03-04 MED ORDER — PHENOL 1.4 % MT LIQD
1.0000 | OROMUCOSAL | Status: DC | PRN
  Administered 2022-03-04: 1 via OROMUCOSAL
  Filled 2022-03-04: qty 177

## 2022-03-04 MED ORDER — METOPROLOL TARTRATE 25 MG PO TABS
37.5000 mg | ORAL_TABLET | Freq: Once | ORAL | Status: AC
  Administered 2022-03-04: 37.5 mg via ORAL
  Filled 2022-03-04: qty 2

## 2022-03-04 MED ORDER — CHLORHEXIDINE GLUCONATE CLOTH 2 % EX PADS
6.0000 | MEDICATED_PAD | Freq: Every day | CUTANEOUS | Status: DC
  Administered 2022-03-04 – 2022-03-15 (×11): 6 via TOPICAL

## 2022-03-04 MED ORDER — POTASSIUM CHLORIDE 10 MEQ/100ML IV SOLN
INTRAVENOUS | Status: AC
  Filled 2022-03-04: qty 100

## 2022-03-04 MED ORDER — MIDODRINE HCL 5 MG PO TABS
5.0000 mg | ORAL_TABLET | Freq: Three times a day (TID) | ORAL | Status: DC
  Administered 2022-03-04 – 2022-03-15 (×34): 5 mg via ORAL
  Filled 2022-03-04 (×34): qty 1

## 2022-03-04 MED ORDER — SODIUM CHLORIDE 0.9 % IV SOLN
2.0000 g | Freq: Three times a day (TID) | INTRAVENOUS | Status: DC
  Administered 2022-03-04 – 2022-03-15 (×34): 2 g via INTRAVENOUS
  Filled 2022-03-04 (×34): qty 12.5

## 2022-03-04 MED ORDER — POTASSIUM CHLORIDE CRYS ER 20 MEQ PO TBCR
40.0000 meq | EXTENDED_RELEASE_TABLET | ORAL | Status: AC
  Administered 2022-03-04 (×2): 40 meq via ORAL
  Filled 2022-03-04 (×2): qty 2

## 2022-03-04 MED ORDER — POTASSIUM CHLORIDE 10 MEQ/100ML IV SOLN
10.0000 meq | INTRAVENOUS | Status: AC
  Administered 2022-03-04 (×4): 10 meq via INTRAVENOUS
  Filled 2022-03-04 (×3): qty 100

## 2022-03-04 NOTE — Plan of Care (Signed)
  Problem: Clinical Measurements: Goal: Will remain free from infection Outcome: Progressing   Problem: Clinical Measurements: Goal: Diagnostic test results will improve Outcome: Progressing   Problem: Clinical Measurements: Goal: Cardiovascular complication will be avoided Outcome: Progressing   Problem: Activity: Goal: Risk for activity intolerance will decrease Outcome: Progressing   Problem: Nutrition: Goal: Adequate nutrition will be maintained Outcome: Progressing

## 2022-03-04 NOTE — Progress Notes (Signed)
PROGRESS NOTE    Alfred Brown [facility]  TZG:[id number] DOB: 1951/03/29 DOA: 03/02/2022 PCP: Redmond School, MD   Brief Narrative:  71 y.o. male with medical history significant of CLL, stage IV lung cancer with bony metastases currently on chemotherapy and has received radiation, hypertension, hyperlipidemia, COPD presented with rectal pain and hematuria with increasing confusion.  On presentation, he was found to be neutropenic.  CT of abdomen and pelvis with contrast showed acute pancreatitis but no abscess or pseudocyst along with right iliac bone infiltrative lytic masses consistent with metastases.  CT of the head without contrast was negative for any acute intracranial abnormality.  He was started on IV fluids, antibiotics and Granix.  Assessment & Plan:   Pseudomonas bacteremia -Questionable cause.  Follow sensitivities.  Continue cefepime.  DC vancomycin   Neutropenic fever -Presented with neutrophils of 0.  Currently on Granix: EDP discussed with Dr. Delton Coombes who recommended to continue Granix till [facility] is more than 1000.  Follow Dr. Tomie China recommendations.  Antibiotic plan as above.  Imaging study as above  Stage IV lung cancer with bony metastasis CLL Cancer associated pain -Follows up with Dr. Delton Coombes as an outpatient.  Has received radiation treatment recently.  Also undergoing chemotherapy.  Follow further recommendations from oncology. -Palliative care consulted for goals of care discussion. -Currently on scheduled OxyContin along with as needed oxycodone.  Paroxysmal A-fib with RVR -Patient had episodes of A-fib with RVR on 03/03/2022 requiring oral and IV metoprolol.  Rate currently controlled.  Consult cardiology.  Elevated lipase -Imaging studies showed acute pancreatitis but clinically patient does not have any symptoms or signs of pancreatitis with benign abdominal exam.  Failure to thrive -Nutrition consult.  Continue Megace.  Hypertension -Blood pressure on  the lower side.  Hold metoprolol if blood pressure is less than 110.  Hyperlipidemia -Continue statin  Depression -Continue bupropion  Possible PE -Patient was empirically started on Eliquis during his last most recent hospitalization from 01/26/2022-01/30/2022 as CT angiogram could not rule out pulmonary embolism -Continue Eliquis  Physical deconditioning -PT eval   DVT prophylaxis: Eliquis Code Status: Full Family Communication: Wife at bedside Disposition Plan: Status is: Inpatient Remains inpatient appropriate because: Of severity of illness   Consultants: Oncology/palliative care.  Consult cardiology Procedures: None  Antimicrobials: Vancomycin cefepime and Flagyl from 03/02/2022 onwards   Subjective: Patient seen and examined at bedside.  Nursing staff reports episodes of tachycardia on 03/03/2022.  No fever, seizures, vomiting reported. Complains of some dry mouth and sore throat. Wife at bedside Objective: Vitals:   03/03/22 1953 03/03/22 2023 03/04/22 0157 03/04/22 0500  BP: (!) 89/61 96/71 94/68    Pulse: 90 93 89   Resp: 18  18   Temp: 98.1 F (36.7 C) 98 F (36.7 C) (!) 97.3 F (36.3 C)   TempSrc: Oral Oral Oral   SpO2: 98% 99% 99%   Weight:    64 kg  Height:        Intake/Output Summary (Last 24 hours) at 03/04/2022 0746 Last data filed at 03/04/2022 0648 Gross per 24 hour  Intake 2378.2 ml  Output 1450 ml  Net 928.2 ml    Filed Weights   03/02/22 1348 03/03/22 0100 03/04/22 0500  Weight: 62.1 kg 64.3 kg 64 kg    Examination:  General: On room air.  No distress.  Looks chronically ill and deconditioned.  Very thinly built. ENT/neck: No thyromegaly.  JVD is not elevated  respiratory: Decreased breath sounds at bases bilaterally with some crackles;  no wheezing   CVS: S1-S2 heard, rate controlled currently Abdominal: Soft, nontender, slightly distended; no organomegaly,  bowel sounds are heard Extremities: Trace lower extremity edema; no  cyanosis  CNS:  More awake and alert today. No focal neurologic deficit.  Moves extremities Lymph: No obvious lymphadenopathy Skin: No obvious ecchymosis/lesions  psych: Not agitated.  Affect is flat  Musculoskeletal: No obvious joint swelling/deformity   Data Reviewed: I have personally reviewed following labs and imaging studies  CBC: Recent Labs  Lab 02/27/22 0821 03/02/22 1356 03/03/22 0612 03/04/22 0607  WBC 32.8* 26.5* 6.2 5.9  NEUTROABS 0.0* 0.0*  --   --   HGB 11.0* 10.9* 8.5* 8.0*  HCT 34.5* 34.0* 26.0* 25.0*  MCV 95.6 94.2 94.2 95.1  PLT 246 212 111* 106*    Basic Metabolic Panel: Recent Labs  Lab 02/27/22 0821 03/02/22 1356 03/03/22 0612 03/04/22 0607  NA 136 135 134* 135  K 3.5 3.7 3.7 2.8*  CL 105 103 106 108  CO2 20* 19* 21* 22  GLUCOSE 113* 118* 150* 108*  BUN 20 30* 28* 20  CREATININE 0.86 1.06 1.00 0.70  CALCIUM 9.4 9.2 8.2* 8.1*  MG 1.7 1.6*  --  1.7    GFR: Estimated Creatinine Clearance: 77.8 mL/min (by C-G formula based on SCr of 0.7 mg/dL). Liver Function Tests: Recent Labs  Lab 02/27/22 0821 03/02/22 1356 03/04/22 0607  AST 14* 13* 16  ALT 13 13 17   ALKPHOS 71 72 50  BILITOT 0.8 1.2 0.5  PROT 6.3* 6.3* 4.4*  ALBUMIN 3.5 3.2* 2.1*    Recent Labs  Lab 03/02/22 1356 03/03/22 0612  LIPASE 72* 25    Recent Labs  Lab 03/02/22 1400  AMMONIA <10    Coagulation Profile: No results for input(s): "INR", "PROTIME" in the last 168 hours. Cardiac Enzymes: No results for input(s): "CKTOTAL", "CKMB", "CKMBINDEX", "TROPONINI" in the last 168 hours. BNP (last 3 results) No results for input(s): "PROBNP" in the last 8760 hours. HbA1C: No results for input(s): "HGBA1C" in the last 72 hours. CBG: No results for input(s): "GLUCAP" in the last 168 hours. Lipid Profile: No results for input(s): "CHOL", "HDL", "LDLCALC", "TRIG", "CHOLHDL", "LDLDIRECT" in the last 72 hours. Thyroid Function Tests: No results for input(s): "TSH",  "T4TOTAL", "FREET4", "T3FREE", "THYROIDAB" in the last 72 hours. Anemia Panel: No results for input(s): "VITAMINB12", "FOLATE", "FERRITIN", "TIBC", "IRON", "RETICCTPCT" in the last 72 hours. Sepsis Labs: Recent Labs  Lab 03/02/22 1356 03/02/22 1716 03/02/22 1832 03/02/22 2054  LATICACIDVEN 1.7 3.0* 1.5 0.9     Recent Results (from the past 240 hour(s))  Blood culture (routine x 2)     Status: None (Preliminary result)   Collection Time: 03/02/22  2:01 PM   Specimen: Right Antecubital; Blood  Result Value Ref Range Status   Specimen Description   Final    RIGHT ANTECUBITAL BOTTLES DRAWN AEROBIC AND ANAEROBIC   Special Requests   Final    Blood Culture results may not be optimal due to an excessive volume of blood received in culture bottles   Culture  Setup Time   Final    GRAM NEGATIVE RODS AEROBIC BOTTLE ONLY Gram Stain Report Called to,Read Back By and Verified With:  T. ISLEY @ 1217 BY STEPHTR 03/03/22    Culture   Final    NO GROWTH 2 DAYS Performed at [facility], [address] [place], [place] [address]    Report Status PENDING  Incomplete  Blood culture (routine x 2)  Status: None (Preliminary result)   Collection Time: 03/02/22  2:06 PM   Specimen: Left Antecubital; Blood  Result Value Ref Range Status   Specimen Description   Final    LEFT ANTECUBITAL BOTTLES DRAWN AEROBIC AND ANAEROBIC Performed at [facility], [address] [place], [place] [address]    Special Requests   Final    Blood Culture results may not be optimal due to an excessive volume of blood received in culture bottles Performed at [facility], [address], [place], [place] [address]    Culture  Setup Time   Final    GRAM NEGATIVE RODS Gram Stain Report Called to,Read Back By and Verified With:  T.ISLEY @ 6222 BY STEPHTR 03/03/22 AEROBIC BOTTLE ONLY Organism ID to follow CRITICAL RESULT CALLED TO, READ BACK BY AND VERIFIED WITH:  Alberteen Sam, RN 03/03/22 1619 A.  LAFRANCE Performed at [facility], [place] [address], [place], [place] [address]    Culture GRAM NEGATIVE RODS  Final   Report Status PENDING  Incomplete  Blood Culture ID Panel (Reflexed)     Status: Abnormal   Collection Time: 03/02/22  2:06 PM  Result Value Ref Range Status   Enterococcus faecalis NOT DETECTED NOT DETECTED Final   Enterococcus Faecium NOT DETECTED NOT DETECTED Final   Listeria monocytogenes NOT DETECTED NOT DETECTED Final   Staphylococcus species NOT DETECTED NOT DETECTED Final   Staphylococcus aureus (BCID) NOT DETECTED NOT DETECTED Final   Staphylococcus epidermidis NOT DETECTED NOT DETECTED Final   Staphylococcus lugdunensis NOT DETECTED NOT DETECTED Final   Streptococcus species NOT DETECTED NOT DETECTED Final   Streptococcus agalactiae NOT DETECTED NOT DETECTED Final   Streptococcus pneumoniae NOT DETECTED NOT DETECTED Final   Streptococcus pyogenes NOT DETECTED NOT DETECTED Final   A.calcoaceticus-baumannii NOT DETECTED NOT DETECTED Final   Bacteroides fragilis NOT DETECTED NOT DETECTED Final   Enterobacterales NOT DETECTED NOT DETECTED Final   Enterobacter cloacae complex NOT DETECTED NOT DETECTED Final   Escherichia coli NOT DETECTED NOT DETECTED Final   Klebsiella aerogenes NOT DETECTED NOT DETECTED Final   Klebsiella oxytoca NOT DETECTED NOT DETECTED Final   Klebsiella pneumoniae NOT DETECTED NOT DETECTED Final   Proteus species NOT DETECTED NOT DETECTED Final   Salmonella species NOT DETECTED NOT DETECTED Final   Serratia marcescens NOT DETECTED NOT DETECTED Final   Haemophilus influenzae NOT DETECTED NOT DETECTED Final   Neisseria meningitidis NOT DETECTED NOT DETECTED Final   Pseudomonas aeruginosa DETECTED (A) NOT DETECTED Final    Comment: CRITICAL RESULT CALLED TO, READ BACK BY AND VERIFIED WITH:  Alberteen Sam, RN 03/03/22 1619 A. LAFRANCE     Stenotrophomonas maltophilia NOT DETECTED NOT DETECTED Final   Candida albicans NOT DETECTED NOT  DETECTED Final   Candida auris NOT DETECTED NOT DETECTED Final   Candida glabrata NOT DETECTED NOT DETECTED Final   Candida krusei NOT DETECTED NOT DETECTED Final   Candida parapsilosis NOT DETECTED NOT DETECTED Final   Candida tropicalis NOT DETECTED NOT DETECTED Final   Cryptococcus neoformans/gattii NOT DETECTED NOT DETECTED Final   CTX-M ESBL NOT DETECTED NOT DETECTED Final   Carbapenem resistance IMP NOT DETECTED NOT DETECTED Final   Carbapenem resistance KPC NOT DETECTED NOT DETECTED Final   Carbapenem resistance NDM NOT DETECTED NOT DETECTED Final   Carbapenem resistance VIM NOT DETECTED NOT DETECTED Final    Comment: Performed at [facility], [address] [place], [place] [address]  Resp Panel by  RT-PCR (Flu A&B, Covid) Anterior Nasal Swab     Status: None   Collection Time: 03/02/22  3:08 PM   Specimen: Anterior Nasal Swab  Result Value Ref Range Status   SARS Coronavirus 2 by RT PCR NEGATIVE NEGATIVE Final    Comment: (NOTE) SARS-CoV-2 target nucleic acids are NOT DETECTED.  The SARS-CoV-2 RNA is generally detectable in upper respiratory specimens during the acute phase of infection. The lowest concentration of SARS-CoV-2 viral copies this assay can detect is 138 copies/mL. A negative result does not preclude SARS-Cov-2 infection and should not be used as the sole basis for treatment or other patient management decisions. A negative result may occur with  improper specimen collection/handling, submission of specimen other than nasopharyngeal swab, presence of viral mutation(s) within the areas targeted by this assay, and inadequate number of viral copies(<138 copies/mL). A negative result must be combined with clinical observations, patient history, and epidemiological information. The expected result is Negative.  Fact Sheet for Patients:  EntrepreneurPulse.com.au  Fact Sheet for Healthcare Providers:   IncredibleEmployment.be  This test is no t yet approved or cleared by the [place] FDA and  has been authorized for detection and/or diagnosis of SARS-CoV-2 by FDA under an Emergency Use Authorization (EUA). This EUA will remain  in effect (meaning this test can be used) for the duration of the COVID-19 declaration under Section 564(b)(1) of the Act, 21 U.S.C.section 360bbb-3(b)(1), unless the authorization is terminated  or revoked sooner.       Influenza A by PCR NEGATIVE NEGATIVE Final   Influenza B by PCR NEGATIVE NEGATIVE Final    Comment: (NOTE) The Xpert Xpress SARS-CoV-2/FLU/RSV plus assay is intended as an aid in the diagnosis of influenza from Nasopharyngeal swab specimens and should not be used as a sole basis for treatment. Nasal washings and aspirates are unacceptable for Xpert Xpress SARS-CoV-2/FLU/RSV testing.  Fact Sheet for Patients: EntrepreneurPulse.com.au  Fact Sheet for Healthcare Providers: IncredibleEmployment.be  This test is not yet approved or cleared by the [place] FDA and has been authorized for detection and/or diagnosis of SARS-CoV-2 by FDA under an Emergency Use Authorization (EUA). This EUA will remain in effect (meaning this test can be used) for the duration of the COVID-19 declaration under Section 564(b)(1) of the Act, 21 U.S.C. section 360bbb-3(b)(1), unless the authorization is terminated or revoked.  Performed at [facility], [address] [place], [place] [address]          Radiology Studies: CT ABDOMEN PELVIS W CONTRAST  Result Date: 03/02/2022 CLINICAL DATA:  Lung cancer.  Sepsis.  Evaluate for metastasis. EXAM: CT ABDOMEN AND PELVIS WITH CONTRAST TECHNIQUE: Multidetector CT imaging of the abdomen and pelvis was performed using the standard protocol following bolus administration of intravenous contrast. RADIATION DOSE REDUCTION: This exam was performed according  to the departmental dose-optimization program which includes automated exposure control, adjustment of the mA and/or kV according to patient size and/or use of iterative reconstruction technique. CONTRAST:  128mL OMNIPAQUE IOHEXOL 300 MG/ML  SOLN COMPARISON:  CT abdomen pelvis dated 12/18/2021. FINDINGS: Evaluation of this exam is limited due to respiratory motion artifact. Lower chest: The visualized lung bases are clear. No intra-abdominal free air or free fluid. Hepatobiliary: The liver is unremarkable. Mild periportal edema. The gallbladder is unremarkable. Pancreas: There is mild stranding of the fat plane surrounding the pancreas most consistent with acute pancreatitis. Correlation with pancreatic enzymes recommended. No drainable fluid collection/abscess or pseudocyst. Spleen: Normal in size without focal abnormality. Adrenals/Urinary  Tract: Mild stranding of the fat plane adjacent to the adrenal glands, nonspecific, possibly related to adrenal stress. Clinical correlation is recommended. The kidneys, visualized ureters appear unremarkable. The urinary bladder is decompressed around a Foley catheter. Air within the bladder introduced via the catheter. Stomach/Bowel: There is sigmoid diverticulosis without active inflammatory changes. Moderate stool throughout the colon. There is no bowel obstruction or active inflammation. The appendix is normal. Vascular/Lymphatic: Advanced aortoiliac atherosclerotic disease. The IVC is unremarkable. No portal venous gas. Top-normal peripancreatic lymph nodes, reactive. Reproductive: Mildly enlarged prostate gland with median lobe hypertrophy. Other: None Musculoskeletal: There is an infiltrative predominantly lytic mass involving the right iliac bone adjacent to the SI joint measuring approximately 5.7 x 3.2 cm. Degenerative changes of the spine. Bilateral L4 pars defects with grade 1 L4-L5 anterolisthesis. No acute osseous pathology. IMPRESSION: 1. Acute pancreatitis.  No  abscess or pseudocyst. 2. Sigmoid diverticulosis. No bowel obstruction. Normal appendix. 3. Right iliac bone infiltrative lytic mass most consistent with metastasis. 4.  Aortic Atherosclerosis (ICD10-I70.0). Electronically Signed   By: Anner Crete M.D.   On: 03/02/2022 20:19   DG Chest Port 1 View  Result Date: 03/02/2022 CLINICAL DATA:  Sepsis EXAM: PORTABLE CHEST 1 VIEW COMPARISON:  Previous studies including the examination of 01/27/2022 FINDINGS: Cardiac size is within normal limits. There is interval improvement in the aeration in patchy infiltrates in left lung. There is small residual infiltrate in the left upper lobe. There are no new infiltrates or signs of pulmonary edema. There is no pleural effusion or pneumothorax. Anterior right fourth rib is not visualized. In the previous CT, a lytic lesion was seen in the anterior right fourth rib suggesting metastatic disease. Tip of right IJ chest port is seen in superior vena cava close to right atrium. IMPRESSION: There is almost complete clearing of patchy infiltrates in left lung with small residual infiltrate in the left upper lung fields suggesting resolving multifocal pneumonia. There are no new focal infiltrates. There is no pleural effusion or pneumothorax. There is expansile lesion in the anterior right fourth rib suggesting malignant neoplastic process. Electronically Signed   By: Elmer Picker M.D.   On: 03/02/2022 18:37   CT Head Wo Contrast  Result Date: 03/02/2022 CLINICAL DATA:  Altered mental status.  Metastatic lung carcinoma. EXAM: CT HEAD WITHOUT CONTRAST TECHNIQUE: Contiguous axial images were obtained from the base of the skull through the vertex without intravenous contrast. RADIATION DOSE REDUCTION: This exam was performed according to the departmental dose-optimization program which includes automated exposure control, adjustment of the mA and/or kV according to patient size and/or use of iterative reconstruction  technique. COMPARISON:  01/19/2022 FINDINGS: Brain: No evidence of intracranial hemorrhage, acute infarction, hydrocephalus, extra-axial collection, or mass lesion/mass effect. Stable mild diffuse cerebral and cerebellar atrophy. Vascular:  No hyperdense vessel or other acute findings. Skull: No evidence of fracture or other significant bone abnormality. Sinuses/Orbits:  No acute findings. Other: None. IMPRESSION: No acute intracranial abnormality. Stable mild diffuse cerebral and cerebellar atrophy. Electronically Signed   By: Marlaine Hind M.D.   On: 03/02/2022 14:45        Scheduled Meds:  apixaban  5 mg Oral BID   buPROPion  150 mg Oral Daily   Chlorhexidine Gluconate Cloth  6 each Topical Daily   feeding supplement  237 mL Oral BID BM   megestrol  400 mg Oral BID   metoprolol tartrate  37.5 mg Oral BID   oxyCODONE  20 mg Oral Q12H  pravastatin  20 mg Oral q1800   senna-docusate  2 tablet Oral BID   Tbo-filgastrim (GRANIX) SQ  300 mcg Subcutaneous q1800   valACYclovir  500 mg Oral Daily   Continuous Infusions:  sodium chloride 100 mL/hr at 03/04/22 0648   ceFEPime (MAXIPIME) IV Stopped (03/04/22 0174)   vancomycin Stopped (03/04/22 0002)          Aline August, MD [facility] 03/04/2022, 7:46 AM

## 2022-03-04 NOTE — Progress Notes (Deleted)
Alfred Brown, male    DOB: 19-Apr-1950,     MRN: [id number]   Brief patient profile:  72 yowm quit smoking June 02 2019  on 2006  On disability for CLL with dx of COPD at  [facility] around 2010 and once or twice weekly needing albuterol and seemed to help cough then covid dx 05/09/19 rx antibody did seem to help  then   Admit date: 06/02/2019 Discharge date: 06/04/2019   Admitted From:  Home  Disposition:  Home    Recommendations for Outpatient Follow-up:  Follow up with PCP in 1 weeks Please obtain BMP/CBC in one week Please follow up on the following pending results:   Discharge Condition: STABLE   CODE STATUS: FULL     Brief Hospitalization Summary: Please see all hospital notes, images, labs for full details of the hospitalization.   ADMISSION HPI:  Alfred Brown  is a 71 y.o. male, with history of CLL, GERD, hyperlipidemia, COPD who was diagnosed with COVID-19 infection 3 weeks ago, and received outpatient IV monoclonal antibody at [facility].  Patient today went to his primary care physician for fever and shortness of breath.  Chest x-ray was consistent with pneumonia.  Patient was sent to ED for further evaluation.  In the ED CT chest showed multifocal pneumonia, worrisome for viral pneumonia. Patient has history of CLL, WBC count is 86,000.  He is followed by hematology/oncology at [facility] as outpatient. He denies nausea vomiting or diarrhea. Patient said that he started having fever last week Also complains of shortness of breath. In the ED patient was put on 2 L/min of oxygen.  He denies abdominal pain, dysuria    Brief Admission Hx: 71 y.o. male, with history of CLL, GERD, hyperlipidemia, COPD who was diagnosed with COVID-19 infection diagnosed 05/09/19 and received outpatient IV monoclonal antibody at [facility] had done well after treatments.  In last couple of days presents with increasing SOB, fever, found to have pneumonia on CXR and new oxygen requirement.     MDM/Assessment & Plan:    Acute   respiratory failure with hypoxia - Pt presented with a new oxygen requirement.  He has had increasing SOB prior to admission.  His symptoms are consistent with COPD exacerbation.  He responded well to treatments and at d/c  off oxygen, ambulating, feeling better.   History of Covid 64 - He was diagnosed on 05/09/19 and received monoclonal antibody therapy and felt much better until recently.  It has been nearly 30 days since he was diagnosed and does not need Covid isolation.   COPD exacerbation - He is being treated with steroids, duonebs and doxycycline.  Atypical pneumonia- CXR and CT findings are likely sequela of post Covid 19 infection.  He is on doxycycline to cover atypicals although his procalcitonin is low which argues against bacterial infection.  CLL with leukocytosis - wife reports he is followed by heme/onc and has WBC counts up to 140, he had a bump in WBC after steroids and we are following.  Hematology consult requested.  Hypogammaglobulinemia - from underlying CLL per hem/onc noted.  They did not recommend IVIG when he was seen on 05/25/19.  I asked for inpatient hematology consult and he was seen by Dr. Delton Coombes and given 1 dose of IVIG.  Pt tolerated it well.  Follow up with Hem/onc clinic in 2 weeks.  Hyperlipidemia - resumed on home meds.  Anxiety / depression - resumed home meds.  Generalized weakness - resolved now, he  is ambulating independently in room.    DVT prophylaxis: lovenox  Code Status: Full  Family Communication: wife updated by telephone  Disposition Plan: home  Consults: Dr. Delton Coombes Hematology/Oncology   Discharge Diagnoses:  Principal Problem:   Acute and chronic respiratory failure with hypoxia ([place]) Active Problems:   GERD (gastroesophageal reflux disease)   Hyperlipidemia   Chronic lymphocytic leukemia ([place])   Pneumonia due to COVID-19 virus   COPD exacerbation ([place])   History of COVID-19 dx 05-09-19      History of Present Illness   08/05/2019  Pulmonary/ 1st office eval/Alfred Brown re GOLD 0 copd  Chief Complaint  Patient presents with   Consult    hx of covid and pna, hospitalized with pna.  sob worse since pna.  sob with exertion.   Dyspnea:  Maybe 100 ft ,  Now sob to mb flat and "pushes thru it but can really feel it fatigue and sob same time, not checking sats while walking   Cough: not a problem  Sleep: flat / one pillow SABA use: never prechallenges rec Don't do any more exertion than you have been at home until you have until you are cleared by your heart doctor - then follow his guidelines for more exercise but you don't have significant COPD so you should do fine as you continue to recover from COVID 19 pneumonia. Try albuterol 15 min before an activity that you know would make you short of breath and see if it makes any difference -  if makes none then don't take it after activity unless you can't catch your breath. Work on inhaler technique:    Make sure you check your oxygen saturations at highest level of activity to be sure it stays over 90%  Let me know if the the trend is down over time as it should gradually improve  Strongly recommend the prevar 13 and the covid vaccination  - discuss with your hematologist    09/17/2019  f/u ov/Drishti Pepperman re: GOLD 0 copd/ AS  Post covid pna ild/ not vaccinated yet  Chief Complaint  Patient presents with   Follow-up    Breathing is about the same, maybe some better since the last visit. He has occ cough with white sputum. He rarely uses his albuterol inhaler.    Dyspnea:  mb and back getting easier with or without inhaler sats upper 90s  Cough: first thing in am, white x two coughs  Sleeping: ok flat  SABA use: none  - does not help ex performance if use prior  02: none  Rec Make sure you check your oxygen saturations about once a week at highest level of activity to be sure it stays over 90% The key is to stop smoking completely before smoking completely stops you! Pulmonary  follow up is as needed     NP recs  11/21/21  Start Trelegy 1 puff daily. Brush tongue and rinse mouth afterwards Continue Albuterol inhaler 2 puffs every 6 hours as needed for shortness of breath or wheezing. Notify if symptoms persist despite rescue inhaler/neb use. Continue loratidine (claritin) 1 tab daily for allergies  Continue flonase nasal spray 2 sprays each nostril daily Lidocaine patch. Place 1 patch onto the skin daily. Remove and discard within 12 hours.      12/04/2021  f/u ov/Susette Seminara re: GOLD 0/AS  maint on trelegy   Chief Complaint  Patient presents with   Follow-up    Pt states no new issues since LOV.  Dyspnea:  really not limited by doe but by pain in area of mets to R 4th rib refractory to oxy 10 mg  Cough: dry  Sleeping: on back 10 degrees  SABA use: not using p trelegy 02: none  Covid status:  vax x none / infected Rec Stop trelegy today and see if your breathing is any worse or increase need for albuterol  Only use your albuterol as a rescue medication  Ok to try albuterol 15 min before an activity (on alternating days)  that you know would usually make you short of breath   I will try to help you get the lidocaine improved           03/04/2022  f/u ov/[place] office/Rikki Smestad re: *** maint on ***  No chief complaint on file.   Dyspnea:  *** Cough: *** Sleeping: *** SABA use: *** 02: *** Covid status: *** Lung cancer screening: ***   No obvious day to day or daytime variability or assoc excess/ purulent sputum or mucus plugs or hemoptysis or cp or chest tightness, subjective wheeze or overt sinus or hb symptoms.   *** without nocturnal  or early am exacerbation  of respiratory  c/o's or need for noct saba. Also denies any obvious fluctuation of symptoms with weather or environmental changes or other aggravating or alleviating factors except as outlined above   No unusual exposure hx or h/o childhood pna/ asthma or knowledge of premature  birth.  Current Allergies, Complete Past Medical History, Past Surgical History, Family History, and Social History were reviewed in [employer] record.  ROS  The following are not active complaints unless bolded Hoarseness, sore throat, dysphagia, dental problems, itching, sneezing,  nasal congestion or discharge of excess mucus or purulent secretions, ear ache,   fever, chills, sweats, unintended wt loss or wt gain, classically pleuritic or exertional cp,  orthopnea pnd or arm/hand swelling  or leg swelling, presyncope, palpitations, abdominal pain, anorexia, nausea, vomiting, diarrhea  or change in bowel habits or change in bladder habits, change in stools or change in urine, dysuria, hematuria,  rash, arthralgias, visual complaints, headache, numbness, weakness or ataxia or problems with walking or coordination,  change in mood or  memory.        No outpatient medications have been marked as taking for the 03/04/22 encounter (Appointment) with Tanda Rockers, MD.                   Past Medical History:  Diagnosis Date   Aortic valve disorder    Mild insufficiency   Chronic lymphocytic leukemia ([place])    01/2012: WBC-90.2, H&H-15.1/45.3, platelets-185 03/31/12: Verified with Dr. Tressie Stalker that no precautions or modification of medical regime are required prior to orthopaedic surgery.    CLL (chronic lymphocytic leukemia) (HCC)    CMV (cytomegalovirus) (HCC)    COPD (chronic obstructive pulmonary disease) (HCC)    Depression with anxiety    DJD (degenerative joint disease)    GERD (gastroesophageal reflux disease)    Hyperlipidemia    Lipoma    left chest wall   Palpitations 2004   PVCs; borderline stress nuclear in 2004, negative in 2007; Echo 2007; AV-sclerotic, very mild AI   Pneumonia    Right bundle branch block    + left posterior fascicular block   Tobacco abuse    80 pack years       Objective:    Wts  03/04/2022         ***  12/04/2021           164   09/17/19 176 lb (79.8 kg)  08/05/19 176 lb (79.8 kg)  07/12/19 172 lb 6.4 oz (78.2 kg)    Vital signs reviewed  12/04/2021  - Note at rest 02 sats  98% on RA     Min barr***               Assessment

## 2022-03-04 NOTE — Consult Note (Signed)
[facility] Consultation Oncology  Name: Alfred Brown      MRN: [id number]    Location: A321/A321-01  Date: 03/04/2022 Time:4:44 PM   REFERRING PHYSICIAN: Dr. Starla Link  REASON FOR CONSULT: Metastatic lung cancer with bone mets   DIAGNOSIS: Sepsis from Pseudomonas bacteremia  HISTORY OF PRESENT ILLNESS: Alfred Brown is a 71 year old very pleasant white male who is seen in consultation today at the request of Dr. Starla Link.  This patient is known to me from office visits.  He has received Keytruda on 02/27/2022.  He presented to the ER on 03/02/2022 with rectal pain and hematuria.  He was also found to be neutropenic and febrile with a temperature of 102.  He was started on broad-spectrum antibiotics.  A CT scan of the abdomen and pelvis with contrast showed acute pancreatitis but no abscess or pseudocyst.  Lytic lesion in the right iliac bone present.  He was started on Granix along with broad-spectrum antibiotics.  Clinically he does not have any signs or symptoms of pancreatitis.  He still has some perirectal pain.  He has not had fever since hospitalization.  PAST MEDICAL HISTORY:   Past Medical History:  Diagnosis Date   Aortic valve disorder    Mild insufficiency   Chronic lymphocytic leukemia (Alfred Brown)    01/2012: WBC-90.2, H&H-15.1/45.3, platelets-185 03/31/12: Verified with Dr. Tressie Stalker that no precautions or modification of medical regime are required prior to orthopaedic surgery.    CLL (chronic lymphocytic leukemia) (HCC)    CMV (cytomegalovirus) (HCC)    COPD (chronic obstructive pulmonary disease) (HCC)    Depression with anxiety    DJD (degenerative joint disease)    GERD (gastroesophageal reflux disease)    Hyperlipidemia    Hypogammaglobulinemia ([place]) 09/23/2019   Lipoma    left chest wall   Palpitations 2004   PVCs; borderline stress nuclear in 2004, negative in 2007; Echo 2007; AV-sclerotic, very mild AI   Pneumonia    Right bundle branch block    + left posterior  fascicular block   Tobacco abuse    80 pack years    ALLERGIES: No Known Allergies    MEDICATIONS: I have reviewed the patient's current medications.     PAST SURGICAL HISTORY Past Surgical History:  Procedure Laterality Date   COLONOSCOPY  01/31/2012   Negative screening study   GANGLION CYST EXCISION  04/02/1995   left wrist   IR IMAGING GUIDED PORT INSERTION  01/14/2022   LIPOMA EXCISION Left 10/11/2021   chest wall   ROTATOR CUFF REPAIR  04/02/1995   right   SHOULDER ARTHROSCOPY WITH SUBACROMIAL DECOMPRESSION  04/09/2012   Procedure: SHOULDER ARTHROSCOPY WITH SUBACROMIAL DECOMPRESSION;  Surgeon: Ninetta Lights, MD;  Location: [facility];  Service: Orthopedics;  Laterality: Left;  LEFT SHOULDER ARTHROSCOPY, SUBACROMIAL DECOMPRESSION, PARTIAL ACROMIOPLASTY WITH CORACOACROMIAL RELEASE, DISTAL CLAVICULECTOMY WITH ROTATOR CUFF REPAIR, DEBRIDEMENT OF LABRUM    FAMILY HISTORY: Family History  Problem Relation Age of Onset   Stroke Mother    Heart attack Father    Cancer Sister        colon   Colon cancer Sister    Cancer Brother        2 brothers died with lung cancer    SOCIAL HISTORY:  reports that he has quit smoking. His smoking use included cigarettes. He started smoking about 58 years ago. He has a 45.00 pack-year smoking history. He has never used smokeless tobacco. He reports that he does not drink alcohol  and does not use drugs.  PERFORMANCE STATUS: The patient's performance status is 2 - Symptomatic, <50% confined to bed  PHYSICAL EXAM: Most Recent Vital Signs: Blood pressure 114/70, pulse 95, temperature 98.4 F (36.9 C), temperature source Oral, resp. rate 14, height _0  (1.854 m), weight 141 lb 1.5 oz (64 kg), SpO2 100 %. BP 114/70 (BP Location: Left Arm)   Pulse 95   Temp 98.4 F (36.9 C) (Oral)   Resp 14   Ht _1  (1.854 m)   Wt 141 lb 1.5 oz (64 kg)   SpO2 100%   BMI 18.62 kg/m  General appearance: alert, cooperative, and  appears stated age Lungs: clear to auscultation bilaterally Heart: regular rate and rhythm Abdomen:  Soft, nontender with no masses. Extremities:  No edema or cyanosis.  LABORATORY DATA:  Results for orders placed or performed during the hospital encounter of 03/02/22 (from the past 48 hour(s))  Lactic acid, plasma     Status: Abnormal   Collection Time: 03/02/22  5:16 PM  Result Value Ref Range   Lactic Acid, Venous 3.0 (HH) 0.5 - 1.9 mmol/L    Comment: CRITICAL RESULT CALLED TO, READ BACK BY AND VERIFIED WITH: T.SOUTHARD BY LBASTON AT 1743, 03/02/22 Performed at [facility], [address], [place], [place] [address]   Troponin I (High Sensitivity)     Status: Abnormal   Collection Time: 03/02/22  5:16 PM  Result Value Ref Range   Troponin I (High Sensitivity) 20 (H) <18 ng/L    Comment: (NOTE) Elevated high sensitivity troponin I (hsTnI) values and significant  changes across serial measurements may suggest ACS but many other  chronic and acute conditions are known to elevate hsTnI results.  Refer to the "Links" section for chest pain algorithms and additional  guidance. Performed at [facility], [address], [place], [place] [address]   Lactic acid, plasma     Status: None   Collection Time: 03/02/22  6:32 PM  Result Value Ref Range   Lactic Acid, Venous 1.5 0.5 - 1.9 mmol/L    Comment: Performed at [facility], [address], [place], [place] [address]  Urinalysis, Routine w reflex microscopic Urine, Catheterized     Status: Abnormal   Collection Time: 03/02/22  7:03 PM  Result Value Ref Range   Color, Urine AMBER (A) YELLOW    Comment: BIOCHEMICALS MAY BE AFFECTED BY COLOR   APPearance CLEAR CLEAR   Specific Gravity, Urine 1.029 1.005 - 1.030   pH 5.0 5.0 - 8.0   Glucose, UA NEGATIVE NEGATIVE mg/dL   Hgb urine dipstick NEGATIVE NEGATIVE   Bilirubin Urine NEGATIVE NEGATIVE   Ketones, ur 5 (A) NEGATIVE mg/dL   Protein, ur 100 (A) NEGATIVE mg/dL   Nitrite NEGATIVE  NEGATIVE   Leukocytes,Ua NEGATIVE NEGATIVE   RBC / HPF 0-5 0 - 5 RBC/hpf   WBC, UA 0-5 0 - 5 WBC/hpf   Bacteria, UA NONE SEEN NONE SEEN    Comment: Performed at [facility], [address] [place], [place] [address]  Urine Culture     Status: None   Collection Time: 03/02/22  7:03 PM   Specimen: Urine, Clean Catch  Result Value Ref Range   Specimen Description      URINE, CLEAN CATCH Performed at [facility], [address], [place], [place] [address]    Special Requests      NONE Performed at [facility], [address] [place],  [address]    Culture  NO GROWTH Performed at [facility], [place] [address], [place], [place] [address]    Report Status 03/04/2022 FINAL   Lactic acid, plasma     Status: None   Collection Time: 03/02/22  8:54 PM  Result Value Ref Range   Lactic Acid, Venous 0.9 0.5 - 1.9 mmol/L    Comment: Performed at [facility], [address], [place], [place] [address]  CBC     Status: Abnormal   Collection Time: 03/03/22  6:12 AM  Result Value Ref Range   WBC 6.2 4.0 - 10.5 K/uL   RBC 2.76 (L) 4.22 - 5.81 MIL/uL   Hemoglobin 8.5 (L) 13.0 - 17.0 g/dL    Comment: DELTA CHECK NOTED   HCT 26.0 (L) 39.0 - 52.0 %   MCV 94.2 80.0 - 100.0 fL   MCH 30.8 26.0 - 34.0 pg   MCHC 32.7 30.0 - 36.0 g/dL   RDW 16.7 (H) 11.5 - 15.5 %   Platelets 111 (L) 150 - 400 K/uL    Comment: REPEATED TO VERIFY   nRBC 0.0 0.0 - 0.2 %    Comment: Performed at [facility], [address] [place], [place] [address]  Basic metabolic panel     Status: Abnormal   Collection Time: 03/03/22  6:12 AM  Result Value Ref Range   Sodium 134 (L) 135 - 145 mmol/L   Potassium 3.7 3.5 - 5.1 mmol/L   Chloride 106 98 - 111 mmol/L   CO2 21 (L) 22 - 32 mmol/L   Glucose, Bld 150 (H) 70 - 99 mg/dL    Comment: Glucose reference range applies only to samples taken after fasting for at least 8 hours.   BUN 28 (H) 8 - 23 mg/dL   Creatinine, Ser 1.00 0.61 - 1.24 mg/dL   Calcium 8.2  (L) 8.9 - 10.3 mg/dL   GFR, Estimated >60 >60 mL/min    Comment: (NOTE) Calculated using the CKD-EPI Creatinine Equation (2021)    Anion gap 7 5 - 15    Comment: Performed at [facility], [address] [place], [place] [address]  Lipase, blood     Status: None   Collection Time: 03/03/22  6:12 AM  Result Value Ref Range   Lipase 25 11 - 51 U/L    Comment: Performed at [facility], [address], [place], [place] [address]  CBC     Status: Abnormal   Collection Time: 03/04/22  6:07 AM  Result Value Ref Range   WBC 5.9 4.0 - 10.5 K/uL   RBC 2.63 (L) 4.22 - 5.81 MIL/uL   Hemoglobin 8.0 (L) 13.0 - 17.0 g/dL   HCT 25.0 (L) 39.0 - 52.0 %   MCV 95.1 80.0 - 100.0 fL   MCH 30.4 26.0 - 34.0 pg   MCHC 32.0 30.0 - 36.0 g/dL   RDW 16.8 (H) 11.5 - 15.5 %   Platelets 106 (L) 150 - 400 K/uL   nRBC 0.0 0.0 - 0.2 %    Comment: Performed at [facility], [address] [place], [place] [address]  Comprehensive metabolic panel     Status: Abnormal   Collection Time: 03/04/22  6:07 AM  Result Value Ref Range   Sodium 135 135 - 145 mmol/L   Potassium 2.8 (L) 3.5 - 5.1 mmol/L    Comment: DELTA CHECK NOTED   Chloride 108 98 - 111 mmol/L   CO2 22 22 - 32 mmol/L   Glucose, Bld 108 (H) 70 -  99 mg/dL    Comment: Glucose reference range applies only to samples taken after fasting for at least 8 hours.   BUN 20 8 - 23 mg/dL   Creatinine, Ser 0.70 0.61 - 1.24 mg/dL   Calcium 8.1 (L) 8.9 - 10.3 mg/dL   Total Protein 4.4 (L) 6.5 - 8.1 g/dL   Albumin 2.1 (L) 3.5 - 5.0 g/dL   AST 16 15 - 41 U/L   ALT 17 0 - 44 U/L   Alkaline Phosphatase 50 38 - 126 U/L   Total Bilirubin 0.5 0.3 - 1.2 mg/dL   GFR, Estimated >60 >60 mL/min    Comment: (NOTE) Calculated using the CKD-EPI Creatinine Equation (2021)    Anion gap 5 5 - 15    Comment: Performed at [facility], [address], [place], [place] [address]  Magnesium     Status: None   Collection Time: 03/04/22  6:07 AM  Result Value Ref Range   Magnesium  1.7 1.7 - 2.4 mg/dL    Comment: Performed at [facility], [address] [place], [place] [address]      RADIOGRAPHY: CT ABDOMEN PELVIS W CONTRAST  Result Date: 03/02/2022 CLINICAL DATA:  Lung cancer.  Sepsis.  Evaluate for metastasis. EXAM: CT ABDOMEN AND PELVIS WITH CONTRAST TECHNIQUE: Multidetector CT imaging of the abdomen and pelvis was performed using the standard protocol following bolus administration of intravenous contrast. RADIATION DOSE REDUCTION: This exam was performed according to the departmental dose-optimization program which includes automated exposure control, adjustment of the mA and/or kV according to patient size and/or use of iterative reconstruction technique. CONTRAST:  182m OMNIPAQUE IOHEXOL 300 MG/ML  SOLN COMPARISON:  CT abdomen pelvis dated 12/18/2021. FINDINGS: Evaluation of this exam is limited due to respiratory motion artifact. Lower chest: The visualized lung bases are clear. No intra-abdominal free air or free fluid. Hepatobiliary: The liver is unremarkable. Mild periportal edema. The gallbladder is unremarkable. Pancreas: There is mild stranding of the fat plane surrounding the pancreas most consistent with acute pancreatitis. Correlation with pancreatic enzymes recommended. No drainable fluid collection/abscess or pseudocyst. Spleen: Normal in size without focal abnormality. Adrenals/Urinary Tract: Mild stranding of the fat plane adjacent to the adrenal glands, nonspecific, possibly related to adrenal stress. Clinical correlation is recommended. The kidneys, visualized ureters appear unremarkable. The urinary bladder is decompressed around a Foley catheter. Air within the bladder introduced via the catheter. Stomach/Bowel: There is sigmoid diverticulosis without active inflammatory changes. Moderate stool throughout the colon. There is no bowel obstruction or active inflammation. The appendix is normal. Vascular/Lymphatic: Advanced aortoiliac atherosclerotic disease.  The IVC is unremarkable. No portal venous gas. Top-normal peripancreatic lymph nodes, reactive. Reproductive: Mildly enlarged prostate gland with median lobe hypertrophy. Other: None Musculoskeletal: There is an infiltrative predominantly lytic mass involving the right iliac bone adjacent to the SI joint measuring approximately 5.7 x 3.2 cm. Degenerative changes of the spine. Bilateral L4 pars defects with grade 1 L4-L5 anterolisthesis. No acute osseous pathology. IMPRESSION: 1. Acute pancreatitis.  No abscess or pseudocyst. 2. Sigmoid diverticulosis. No bowel obstruction. Normal appendix. 3. Right iliac bone infiltrative lytic mass most consistent with metastasis. 4.  Aortic Atherosclerosis (ICD10-I70.0). Electronically Signed   By: AAnner CreteM.D.   On: 03/02/2022 20:19   DG Chest Port 1 View  Result Date: 03/02/2022 CLINICAL DATA:  Sepsis EXAM: PORTABLE CHEST 1 VIEW COMPARISON:  Previous studies including the examination of 01/27/2022 FINDINGS: Cardiac size is within normal limits. There is interval improvement in the aeration  in patchy infiltrates in left lung. There is small residual infiltrate in the left upper lobe. There are no new infiltrates or signs of pulmonary edema. There is no pleural effusion or pneumothorax. Anterior right fourth rib is not visualized. In the previous CT, a lytic lesion was seen in the anterior right fourth rib suggesting metastatic disease. Tip of right IJ chest port is seen in superior vena cava close to right atrium. IMPRESSION: There is almost complete clearing of patchy infiltrates in left lung with small residual infiltrate in the left upper lung fields suggesting resolving multifocal pneumonia. There are no new focal infiltrates. There is no pleural effusion or pneumothorax. There is expansile lesion in the anterior right fourth rib suggesting malignant neoplastic process. Electronically Signed   By: Elmer Picker M.D.   On: 03/02/2022 18:37          ASSESSMENT and PLAN:  1.  Metastatic squamous cell lung cancer to the bones, PD-L1 40%, TMB high: - Last Keytruda on 02/27/2022.  2.  Pseudomonas bacteremia: - Blood cultures positive for Pseudomonas.  He is on cefepime.  Vancomycin discontinued. - Follow-up on sensitivities.  3.  Neutropenic fever: - Presentation with a fever of 102.  ANC 0. - He started on Granix daily until [place] more than 1000. - Continue IV antibiotics. - Will check differential tomorrow along with immunoglobulin levels.  4.  Elevated lipase/?  Pancreatitis: - Lipase was 72 on 03/02/2022, normal today at 25. - Clinically he does not have any signs or symptoms of pancreatitis.  5.  Possible pulmonary embolism: - Continue Eliquis twice daily.   All questions were answered. The patient knows to call the clinic with any problems, questions or concerns. We can certainly see the patient much sooner if necessary.   Derek Jack

## 2022-03-04 NOTE — TOC Initial Note (Signed)
Transition of Care [facility]) - Initial/Assessment Note    Patient Details  Name: Alfred Brown MRN: [id number] Date of Birth: 31-May-1950  [facility]) CM/SW Contact:    Ihor Gully, LCSW Phone Number: 03/04/2022, 4:39 PM  Clinical Narrative:                 Patient from home with spouse. Admitted for neutropenic fever. Considered high risk for readmission. At baseline has been ambulating independently. Wife states he needs RW. RW ordered via [employer] as spouse did not have DME preference. Attending notified of needed order. Active with [facility] RN. Spouse assists with ADLs.   Expected Discharge Plan: [place] Barriers to Discharge: Continued Medical Work up   Patient Goals and CMS Choice        Expected Discharge Plan and Services Expected Discharge Plan: [place]                         DME Arranged: [place] DME Agency: [employer] Date DME Agency Contacted: 03/04/22 Time DME Agency Contacted: [phone] Representative spoke with at DME Agency: Erasmo Downer HH Arranged: PT, RN [place] Agency: [place] ([place]) Date [place]: 03/04/22 Time [place]: 8 Representative spoke with at [place]: [place] Arrangements/Services   Lives with:: Spouse   Do you feel safe going back to the place where you live?: Yes          Current home services: Home RN    Activities of Daily Living Home Assistive Devices/Equipment: None ADL Screening (condition at time of admission) Patient's cognitive ability adequate to safely complete daily activities?: Yes Is the patient deaf or have difficulty hearing?: No Does the patient have difficulty seeing, even when wearing glasses/contacts?: No Does the patient have difficulty concentrating, remembering, or making decisions?: No Patient able to express need for assistance with ADLs?: Yes Does the patient have difficulty dressing or bathing?:  Yes Independently performs ADLs?: No Communication: Independent Dressing (OT): Needs assistance Is this a change from baseline?: Pre-admission baseline Grooming: Needs assistance Is this a change from baseline?: Pre-admission baseline Feeding: Independent Bathing: Needs assistance Is this a change from baseline?: Pre-admission baseline Toileting: Needs assistance Is this a change from baseline?: Pre-admission baseline In/Out Bed: Needs assistance Is this a change from baseline?: Pre-admission baseline Walks in Home: Needs assistance Is this a change from baseline?: Pre-admission baseline Does the patient have difficulty walking or climbing stairs?: Yes Weakness of Legs: Both Weakness of Arms/Hands: Both  Permission Sought/Granted Permission sought to share information with : Family Supports    Share Information with NAME: Mrs. Shipper           Emotional Assessment           Psych Involvement: No (comment)  Admission diagnosis:  Neutropenic fever ([place]) [D70.9, R50.81] Patient Active Problem List   Diagnosis Date Noted   Bacteremia due to Pseudomonas 03/04/2022   Neutropenic fever ([place]) 03/02/2022   Pneumonia 01/27/2022   Hypocalcemia 01/27/2022   Pressure injury of skin 01/24/2022   Protein-calorie malnutrition, severe 01/21/2022   Hypokalemia 01/20/2022   Hypoalbuminemia 01/20/2022   Depression 01/20/2022   Failure to thrive in adult 01/20/2022   Unintentional weight loss 01/20/2022   Prolonged QT interval 01/20/2022   SVT (supraventricular tachycardia) 01/20/2022   Sinus tachycardia 01/20/2022   Squamous cell lung cancer, left ([place]) 01/07/2022   Intractable vomiting 12/19/2021  Intractable nausea and vomiting 12/18/2021   Leukocytosis 12/18/2021   Cancer associated pain 12/18/2021   Generalized anxiety disorder 12/18/2021   Rib pain on right side 11/22/2021   Mass of left lung 11/15/2021   Lipoma of anterior chest wall 09/20/2021   Peyronie's disease  11/24/2019   Hypogammaglobulinemia ([place]) 09/23/2019   COPD (chronic obstructive pulmonary disease) ([place]) 08/05/2019   Acute exacerbation of COPD with asthma ([place]) 06/03/2019   Acute and chronic respiratory failure with hypoxia ([place]) 06/03/2019   History of COVID-19 dx 05-09-19 06/03/2019   Pneumonia due to COVID-19 virus 06/02/2019   GERD (gastroesophageal reflux disease)    Hyperlipidemia    Palpitations    Chronic lymphocytic leukemia ([place])    Aortic valve disorder    Right bundle branch block    Tobacco abuse    PCP:  Redmond School, MD Pharmacy:   [place], [place] [address] [place] [address] Phone: [phone] Fax: [phone]     Social Determinants of Health (SDOH) Interventions    Readmission Risk Interventions    01/28/2022   10:39 AM 01/21/2022   11:57 AM  Readmission Risk Prevention Plan  Transportation Screening Complete Complete  HRI or Home Care Consult  Complete  Social Work Consult for [place] Planning/Counseling  Complete  Palliative Care Screening  Not Applicable  Medication Review Press photographer) Complete Complete  PCP or Specialist appointment within 3-5 days of discharge Not Complete   HRI or [place] Complete   Palliative Care Screening Not Complete   [place] Not Applicable

## 2022-03-04 NOTE — Progress Notes (Addendum)
Initial Nutrition Assessment  DOCUMENTATION CODES:  Severe malnutrition in context of chronic illness  INTERVENTION:  Liberalize diet to regular Discontinue Ensure Enlive due to very poor acceptance Provide Carntation Breakfast Essential + whole milk BID-290kcal, 13g protein Provide Magic Cup q day-290kcal, 9g protein Follow up with outpatient RD  NUTRITION DIAGNOSIS:  Severe Malnutrition related to cancer and cancer related treatments, chronic illness as evidenced by percent weight loss, energy intake < 75% for > or equal to 1 month.  GOAL:  Patient will meet greater than or equal to 90% of their needs  MONITOR:  Supplement acceptance, Diet advancement  REASON FOR ASSESSMENT:  Malnutrition Screening Tool    ASSESSMENT:  Pt is a 71yo M with PMH of CLL, stage IV lung cancer with bony metastases currently on chemotherapy and has received radiation, HTN, HLD, and COPD who presents with rectal pain, hematuria and increasing confusion.  Screened for RD assessment due MST w/ reported weight loss and decreased po intake. Pt followed by outpt RD at cancer center. Chart review shows a significant 8% unintended weight loss in the last 1 month. Previously on Marinol but medication made him loopy, now on Megace. Spoke with RN about pt's po intake. Pt with minimal po intake during admission and refusing Ensure Enlive. Also over at least the last month has been taking just bites of food and meals, and will accept only 1 carnation breakfast essentials (CBE) packet mixed with milk or water/day. Pt meeting <75% estimated nutrient needs for at least 1 month. Pt meets ASPEN criteria for severe protein calorie malnutrition r/t chronic illness.  Ordered CBE BID mixed with whole milk during admission to provide 290kcal, and 13g protein/serving. Magic Cup q day to provide 290kcal and 9g protein/cup. Liberalize diet to regular to allow more menu options. Encourage small, frequent meals, snacks and  supplements.     Wt Readings from Last 10 Encounters:  03/04/22 64 kg  02/27/22 62.2 kg  02/06/22 63.9 kg  01/30/22 69.7 kg  01/20/22 70.6 kg  01/16/22 68 kg  01/14/22 68 kg  01/07/22 68.7 kg  12/18/21 75.8 kg  12/06/21 75.8 kg   Medications reviewed and include: eliquis, megace, oxycontin, KCl, pravastatin, senna-docusate, NS  Labs reviewed: K:2.8, Na:135, BG:108   NUTRITION - FOCUSED PHYSICAL EXAM: Deferred, RD working remotely  Diet Order:   Diet Order             Diet regular Room service appropriate? Yes; Fluid consistency: Thin  Diet effective now                   EDUCATION NEEDS:  Not appropriate for education at this time  Skin:  Skin Assessment: Reviewed RN Assessment  Last BM:  12/3  Height:  Ht Readings from Last 1 Encounters:  03/03/22 6\' 1"  (1.854 m)   Weight:  Wt Readings from Last 1 Encounters:  03/04/22 64 kg   BMI:  Body mass index is 18.62 kg/m.  Estimated Nutritional Needs:  Kcal:  1920-2240kcal Protein:  80-100g Fluid:  1920-223mL  Candise Bowens, MS, RD, LDN, CNSC See [facility] for contact information

## 2022-03-04 NOTE — Progress Notes (Signed)
   03/04/22 1037  Assess: MEWS Score  Temp 97.6 F (36.4 C)  BP 98/85  MAP (mmHg) 91  Pulse Rate (!) 108  Resp 18  Level of Consciousness Alert  SpO2 99 %  O2 Device Room Air  Assess: MEWS Score  MEWS Temp 0  MEWS Systolic 1  MEWS Pulse 1  MEWS RR 0  MEWS LOC 0  MEWS Score 2  MEWS Score Color Yellow  Assess: if the MEWS score is Yellow or Red  Were vital signs taken at a resting state? Yes  Focused Assessment No change from prior assessment  Does the patient meet 2 or more of the SIRS criteria? No  Does the patient have a confirmed or suspected source of infection? Yes  Provider and Rapid Response Notified? No  MEWS guidelines implemented *See Row Information* Yes  Treat  MEWS Interventions Administered scheduled meds/treatments  Pain Scale 0-10  Pain Score 0  Take Vital Signs  Increase Vital Sign Frequency  Yellow: Q 2hr X 2 then Q 4hr X 2, if remains yellow, continue Q 4hrs  Escalate  MEWS: Escalate Yellow: discuss with charge nurse/RN and consider discussing with provider and RRT  Notify: Charge Nurse/RN  Name of Charge Nurse/RN Comptroller, RN  Date Charge Nurse/RN Notified 03/04/22  Time Charge Nurse/RN Notified 1100  Provider Notification  Provider Name/Title Starla Link, MD and Harl Bowie, MD  Date Provider Notified 03/04/22  Time Provider Notified 1040  Method of Notification Face-to-face (secure chat)  Notification Reason  (yellow mews, pulse rate elevated)  Provider response See new orders  Date of Provider Response 03/04/22  Time of Provider Response 1040  Document  Patient Outcome Stabilized after interventions  Progress note created (see row info) Yes  Assess: SIRS CRITERIA  SIRS Temperature  0  SIRS Pulse 1  SIRS Respirations  0  SIRS WBC 1  SIRS Score Sum  2

## 2022-03-04 NOTE — Evaluation (Signed)
Physical Therapy Evaluation Patient Details Name: Alfred Brown MRN: [id number] DOB: 08-Nov-1950 Today's Date: 03/04/2022  History of Present Illness  Alfred Brown is a 71 y.o. male with medical history significant of CLL, lung cancer, hypertension, hyperlipidemia, COPD, cancer associated pain. He is currently on chemotherapy. Patient presents with rectal pain and hematuria that started yesterday and has been increasing.   Clinical Impression  Patient functioning near baseline for functional mobility and gait. Patient able to ambulate 85 ft with min guard and no AD, did use rails in hallway for stability, safer with RW (ordered). Patient tolerated sitting up in chair after therapy with wife in room. Plan: patient discharged from physical therapy to care of nursing for ambulation daily as tolerated for length of stay.     Recommendations for follow up therapy are one component of a multi-disciplinary discharge planning process, led by the attending physician.  Recommendations may be updated based on patient status, additional functional criteria and insurance authorization.  Follow Up Recommendations Home health PT      Assistance Recommended at Discharge Set up Supervision/Assistance  Patient can return home with the following  Help with stairs or ramp for entrance;Assistance with cooking/housework    Equipment Recommendations Rolling walker (2 wheels)  Recommendations for Other Services       Functional Status Assessment Patient has had a recent decline in their functional status and demonstrates the ability to make significant improvements in function in a reasonable and predictable amount of time.     Precautions / Restrictions Precautions Precautions: Fall Restrictions Weight Bearing Restrictions: No      Mobility  Bed Mobility Overal bed mobility: Needs Assistance Bed Mobility: Supine to Sit     Supine to sit: Min assist     General bed mobility comments: min/hand  held assist to elevate trunk and scoot to EOB    Transfers Overall transfer level: Needs assistance Equipment used: Rolling walker (2 wheels) Transfers: Sit to/from Stand, Bed to chair/wheelchair/BSC Sit to Stand: Min guard   Step pivot transfers: Min guard       General transfer comment: patient able to stand and transfer to chair with min guard/no AD, had to use chair arm rests for support, safer with RW    Ambulation/Gait Ambulation/Gait assistance: Min guard Gait Distance (Feet): 85 Feet Assistive device: Rolling walker (2 wheels) Gait Pattern/deviations: Decreased step length - right, Decreased step length - left, Decreased stride length Gait velocity: slightly decreased     General Gait Details: patient able to ambulate 85 feet in hallway with min guard/no AD, did use rails in hallway for support, safer with RW  Stairs            Wheelchair Mobility    Modified Rankin (Stroke Patients Only)       Balance Overall balance assessment: Needs assistance Sitting-balance support: No upper extremity supported, Feet supported Sitting balance-Leahy Scale: Good Sitting balance - Comments: seated EOB   Standing balance support: During functional activity, No upper extremity supported Standing balance-Leahy Scale: Good Standing balance comment: fair/good. would improve with RW                             Pertinent Vitals/Pain Pain Assessment Pain Assessment: 0-10 Pain Score: 6  Pain Location: anus, left hip region Pain Descriptors / Indicators: Sore, Discomfort, Grimacing, Guarding Pain Intervention(s): Limited activity within patient's tolerance, Monitored during session, Repositioned    Home Living Family/patient expects  to be discharged to:: Private residence Living Arrangements: Spouse/significant other Available Help at Discharge: Family Type of Home: Mobile home Home Access: Ramped entrance       [place]: One level        Prior  Function Prior Level of Function : Needs assist       Physical Assist : ADLs (physical)   ADLs (physical): IADLs Mobility Comments: Ambulates without AD ADLs Comments: Wife assists with some ADLs/iADLs     Hand Dominance   Dominant Hand: Right    Extremity/Trunk Assessment   Upper Extremity Assessment Upper Extremity Assessment: Overall WFL for tasks assessed    Lower Extremity Assessment Lower Extremity Assessment: Overall WFL for tasks assessed    Cervical / Trunk Assessment Cervical / Trunk Assessment: Normal  Communication   Communication: No difficulties  Cognition Arousal/Alertness: Awake/alert Behavior During Therapy: WFL for tasks assessed/performed Overall Cognitive Status: Within Functional Limits for tasks assessed                                          General Comments      Exercises     Assessment/Plan    PT Assessment All further PT needs can be met in the next venue of care  PT Problem List Decreased strength;Decreased activity tolerance;Decreased balance;Decreased mobility       PT Treatment Interventions      PT Goals (Current goals can be found in the Care Plan section)  Acute Rehab PT Goals Patient Stated Goal: return home PT Goal Formulation: With patient/family Time For Goal Achievement: 03/04/22 Potential to Achieve Goals: Good    Frequency       Co-evaluation               AM-PAC PT "6 Clicks" Mobility  Outcome Measure Help needed turning from your back to your side while in a flat bed without using bedrails?: None Help needed moving from lying on your back to sitting on the side of a flat bed without using bedrails?: A Little Help needed moving to and from a bed to a chair (including a wheelchair)?: None Help needed standing up from a chair using your arms (e.g., wheelchair or bedside chair)?: None Help needed to walk in hospital room?: A Little Help needed climbing 3-5 steps with a railing? : A  Little 6 Click Score: 21    End of Session Equipment Utilized During Treatment: Gait belt Activity Tolerance: Patient tolerated treatment well Patient left: in chair;with family/visitor present;with call bell/phone within reach Nurse Communication: Mobility status PT Visit Diagnosis: Unsteadiness on feet (R26.81);Other abnormalities of gait and mobility (R26.89);Muscle weakness (generalized) (M62.81)    Time: 0347-4259 PT Time Calculation (min) (ACUTE ONLY): 21 min   Charges:   PT Evaluation $PT Eval Moderate Complexity: 1 Mod PT Treatments $Therapeutic Activity: 8-22 mins        Zigmund Gottron, SPT
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
## 2022-03-05 DIAGNOSIS — D709 Neutropenia, unspecified: Secondary | ICD-10-CM | POA: Diagnosis not present

## 2022-03-05 DIAGNOSIS — Z7189 Other specified counseling: Secondary | ICD-10-CM | POA: Diagnosis not present

## 2022-03-05 DIAGNOSIS — R7881 Bacteremia: Secondary | ICD-10-CM | POA: Diagnosis not present

## 2022-03-05 DIAGNOSIS — B965 Pseudomonas (aeruginosa) (mallei) (pseudomallei) as the cause of diseases classified elsewhere: Secondary | ICD-10-CM

## 2022-03-05 DIAGNOSIS — G893 Neoplasm related pain (acute) (chronic): Secondary | ICD-10-CM | POA: Diagnosis not present

## 2022-03-05 DIAGNOSIS — C9110 Chronic lymphocytic leukemia of B-cell type not having achieved remission: Secondary | ICD-10-CM | POA: Diagnosis not present

## 2022-03-05 DIAGNOSIS — R5081 Fever presenting with conditions classified elsewhere: Secondary | ICD-10-CM | POA: Diagnosis not present

## 2022-03-05 LAB — CBC WITH DIFFERENTIAL/PLATELET
Abs Immature Granulocytes: 0 10*3/uL (ref 0.00–0.07)
Basophils Absolute: 0 10*3/uL (ref 0.0–0.1)
Basophils Relative: 0 %
Eosinophils Absolute: 0 10*3/uL (ref 0.0–0.5)
Eosinophils Relative: 0 %
HCT: 25.1 % — ABNORMAL LOW (ref 39.0–52.0)
Hemoglobin: 7.8 g/dL — ABNORMAL LOW (ref 13.0–17.0)
Immature Granulocytes: 0 %
Lymphocytes Relative: 99 %
Lymphs Abs: 8.5 10*3/uL — ABNORMAL HIGH (ref 0.7–4.0)
MCH: 29.4 pg (ref 26.0–34.0)
MCHC: 31.1 g/dL (ref 30.0–36.0)
MCV: 94.7 fL (ref 80.0–100.0)
Monocytes Absolute: 0.1 10*3/uL (ref 0.1–1.0)
Monocytes Relative: 1 %
Neutro Abs: 0 10*3/uL — CL (ref 1.7–7.7)
Neutrophils Relative %: 0 %
Platelets: 142 10*3/uL — ABNORMAL LOW (ref 150–400)
RBC: 2.65 MIL/uL — ABNORMAL LOW (ref 4.22–5.81)
RDW: 16.7 % — ABNORMAL HIGH (ref 11.5–15.5)
WBC: 8.5 10*3/uL (ref 4.0–10.5)
nRBC: 0 % (ref 0.0–0.2)

## 2022-03-05 LAB — BASIC METABOLIC PANEL
Anion gap: 6 (ref 5–15)
BUN: 13 mg/dL (ref 8–23)
CO2: 19 mmol/L — ABNORMAL LOW (ref 22–32)
Calcium: 8.1 mg/dL — ABNORMAL LOW (ref 8.9–10.3)
Chloride: 111 mmol/L (ref 98–111)
Creatinine, Ser: 0.49 mg/dL — ABNORMAL LOW (ref 0.61–1.24)
GFR, Estimated: >60 mL/min (ref >60)
Glucose, Bld: 83 mg/dL (ref 70–99)
Potassium: 3.1 mmol/L — ABNORMAL LOW (ref 3.5–5.1)
Sodium: 136 mmol/L (ref 135–145)

## 2022-03-05 LAB — CULTURE, BLOOD (ROUTINE X 2)

## 2022-03-05 LAB — LACTATE DEHYDROGENASE: LDH: 64 U/L — ABNORMAL LOW (ref 98–192)

## 2022-03-05 LAB — MAGNESIUM: Magnesium: 1.3 mg/dL — ABNORMAL LOW (ref 1.7–2.4)

## 2022-03-05 MED ORDER — POTASSIUM CHLORIDE CRYS ER 20 MEQ PO TBCR
40.0000 meq | EXTENDED_RELEASE_TABLET | ORAL | Status: AC
  Administered 2022-03-05 (×2): 40 meq via ORAL
  Filled 2022-03-05 (×2): qty 2

## 2022-03-05 MED ORDER — OXYBUTYNIN CHLORIDE 5 MG/5ML PO SOLN: 5.0000 mg | Freq: Once | ORAL | Status: DC

## 2022-03-05 MED ORDER — PROCHLORPERAZINE EDISYLATE 10 MG/2ML IJ SOLN
10.0000 mg | Freq: Four times a day (QID) | INTRAMUSCULAR | Status: DC | PRN
  Administered 2022-03-13: 10 mg via INTRAVENOUS
  Filled 2022-03-05: qty 2

## 2022-03-05 MED ORDER — MAGNESIUM SULFATE 2 GM/50ML IV SOLN
2.0000 g | Freq: Once | INTRAVENOUS | Status: AC
  Administered 2022-03-05: 2 g via INTRAVENOUS
  Filled 2022-03-05: qty 50

## 2022-03-05 MED ORDER — NYSTATIN 100000 UNIT/ML MT SUSP
5.0000 mL | Freq: Four times a day (QID) | OROMUCOSAL | Status: DC
  Administered 2022-03-05 – 2022-03-14 (×36): 500000 [IU] via ORAL
  Filled 2022-03-05 (×36): qty 5

## 2022-03-05 MED ORDER — OXYBUTYNIN CHLORIDE 5 MG PO TABS
5.0000 mg | ORAL_TABLET | Freq: Once | ORAL | Status: AC
  Administered 2022-03-05: 5 mg via ORAL
  Filled 2022-03-05: qty 1

## 2022-03-05 NOTE — Progress Notes (Signed)
Palliative-  Chart reviewed- spoke with patient's wife. Plan to meet at 12N today.   Mariana Kaufman, AGNP-C Palliative Medicine  No charge

## 2022-03-05 NOTE — Progress Notes (Signed)
PROGRESS NOTE    Alfred Brown [facility]  WEX:[id number] DOB: December 13, 1950 DOA: 03/02/2022 PCP: Redmond School, MD   Brief Narrative:  71 y.o. male with medical history significant of CLL, stage IV lung cancer with bony metastases currently on chemotherapy and has received radiation, hypertension, hyperlipidemia, COPD presented with rectal pain and hematuria with increasing confusion.  On presentation, he was found to be neutropenic.  CT of abdomen and pelvis with contrast showed acute pancreatitis but no abscess or pseudocyst along with right iliac bone infiltrative lytic masses consistent with metastases.  CT of the head without contrast was negative for any acute intracranial abnormality.  He was started on IV fluids, antibiotics and Granix.  Assessment & Plan:   Pseudomonas bacteremia -Questionable cause.  Follow sensitivities.  Continue cefepime.  Dc'd vancomycin  -Will repeat blood cultures today.  If persistently bacteremic, might have to consider removing of port.  Neutropenic fever -Presented with neutrophils of 0.  Currently on Granix as per Dr. Delton Coombes who recommended to continue Granix till [facility] is more than 1000.  Antibiotic plan as above.  Imaging study as above  Stage IV lung cancer with bony metastasis CLL Cancer associated pain -Follows up with Dr. Delton Coombes as an outpatient.  Has received radiation treatment recently.  Also undergoing chemotherapy.  Follow further recommendations from oncology. -Palliative care consultation pending -Currently on scheduled OxyContin along with as needed oxycodone.  Oral thrush -Complains of sore throat and has evidence of oral thrush.  Start nystatin: Wife reports that nystatin has worked for him in the past.  Paroxysmal A-fib with RVR -Patient had episodes of A-fib with RVR on 03/03/2022 requiring oral and IV metoprolol.  Rate currently controlled.  Cardiology following.  Continue Eliquis.  Elevated lipase -Imaging studies showed acute  pancreatitis but clinically patient does not have any symptoms or signs of pancreatitis with benign abdominal exam.  Failure to thrive Severe malnutrition -Nutrition following.  Continue Megace.  Hypertension -Blood pressure on the lower side.  Continue metoprolol if blood pressure allows.  Hypokalemia -Replace.  Repeat a.m. labs  Hypomagnesemia -Replace.  Repeat a.m. labs  Hyperlipidemia -Continue statin  Depression -Continue bupropion  Possible PE -Patient was empirically started on Eliquis during his last most recent hospitalization from 01/26/2022-01/30/2022 as CT angiogram could not rule out pulmonary embolism -Continue Eliquis  Physical deconditioning -PT recommends home health PT  DVT prophylaxis: Eliquis Code Status: Full Family Communication: Wife at bedside Disposition Plan: Status is: Inpatient Remains inpatient appropriate because: Of severity of illness   Consultants: Oncology/palliative care/cardiology Procedures: None  Antimicrobials:  Anti-infectives (From admission, onward)    Start     Dose/Rate Route Frequency Ordered Stop   03/04/22 1400  ceFEPIme (MAXIPIME) 2 g in sodium chloride 0.9 % 100 mL IVPB        2 g 200 mL/hr over 30 Minutes Intravenous Every 8 hours 03/04/22 0807     03/03/22 2300  vancomycin (VANCOREADY) IVPB 1250 mg/250 mL  Status:  Discontinued        1,250 mg 166.7 mL/hr over 90 Minutes Intravenous Every 24 hours 03/02/22 2148 03/04/22 0755   03/03/22 1000  valACYclovir (VALTREX) tablet 500 mg        500 mg Oral Daily 03/02/22 2220     03/03/22 0600  ceFEPIme (MAXIPIME) 2 g in sodium chloride 0.9 % 100 mL IVPB  Status:  Discontinued        2 g 200 mL/hr over 30 Minutes Intravenous Every 12 hours 03/02/22 2148 03/02/22  2149   03/03/22 0600  ceFEPIme (MAXIPIME) 2 g in sodium chloride 0.9 % 100 mL IVPB  Status:  Discontinued        2 g 200 mL/hr over 30 Minutes Intravenous Every 12 hours 03/02/22 2149 03/04/22 0807   03/02/22  2200  vancomycin (VANCOREADY) IVPB 1500 mg/300 mL        1,500 mg 150 mL/hr over 120 Minutes Intravenous  Once 03/02/22 2135 03/03/22 0043   03/02/22 1815  ceFEPIme (MAXIPIME) 2 g in sodium chloride 0.9 % 100 mL IVPB        2 g 200 mL/hr over 30 Minutes Intravenous  Once 03/02/22 1800 03/02/22 1908   03/02/22 1815  metroNIDAZOLE (FLAGYL) IVPB 500 mg        500 mg 100 mL/hr over 60 Minutes Intravenous  Once 03/02/22 1800 03/02/22 2016        Subjective: Patient seen and examined at bedside.  Wife at bedside.  No fever, vomiting, agitation reported.  Patient complains of sore throat.  Nursing staff reports episodes of confusion overnight.   Objective: Vitals:   03/04/22 1634 03/04/22 2045 03/05/22 0501 03/05/22 0654  BP: 114/70 116/74 105/84   Pulse: 95 89 94   Resp: 14 19 19    Temp: 98.4 F (36.9 C) 97.8 F (36.6 C) 98 F (36.7 C)   TempSrc: Oral Oral    SpO2: 100% 99% 99%   Weight:    69.9 kg  Height:       No intake or output data in the 24 hours ending 03/05/22 0747  Filed Weights   03/03/22 0100 03/04/22 0500 03/05/22 0654  Weight: 64.3 kg 64 kg 69.9 kg    Examination:  General: No acute distress.  Currently on room air.  Looks chronically ill and deconditioned.  Very thinly built. ENT/neck: No palpable neck masses or JVD elevation noted.  Oral thrush noted respiratory: Bilateral decreased breath sounds at bases with scattered crackles CVS: Mostly rate controlled; S1 and S2 are heard Abdominal: Soft, nontender, distended mildly; no organomegaly, bowel sounds heard normally  extremities: No clubbing; mild lower extremity edema present CNS: Awake, slow to respond.  Poor historian.  No focal neurologic deficit.  Able to move extremities Lymph: No palpable cervical lymphadenopathy noted  skin: No obvious rashes/petechiae psych: Affect is still mostly flat.  Showing no signs of agitation currently.  Musculoskeletal: No obvious joint  erythema/swelling/deformity   Data Reviewed: I have personally reviewed following labs and imaging studies  CBC: Recent Labs  Lab 02/27/22 0821 03/02/22 1356 03/03/22 0612 03/04/22 0607 03/05/22 0631  WBC 32.8* 26.5* 6.2 5.9 8.5  NEUTROABS 0.0* 0.0*  --   --  0.0*  HGB 11.0* 10.9* 8.5* 8.0* 7.8*  HCT 34.5* 34.0* 26.0* 25.0* 25.1*  MCV 95.6 94.2 94.2 95.1 94.7  PLT 246 212 111* 106* 142*    Basic Metabolic Panel: Recent Labs  Lab 02/27/22 0821 03/02/22 1356 03/03/22 0612 03/04/22 0607 03/05/22 0631  NA 136 135 134* 135 136  K 3.5 3.7 3.7 2.8* 3.1*  CL 105 103 106 108 111  CO2 20* 19* 21* 22 19*  GLUCOSE 113* 118* 150* 108* 83  BUN 20 30* 28* 20 13  CREATININE 0.86 1.06 1.00 0.70 0.49*  CALCIUM 9.4 9.2 8.2* 8.1* 8.1*  MG 1.7 1.6*  --  1.7 1.3*    GFR: Estimated Creatinine Clearance: 84.9 mL/min (A) (by C-G formula based on SCr of 0.49 mg/dL (L)). Liver Function Tests: Recent Labs  Lab 02/27/22 0821 03/02/22 1356 03/04/22 0607  AST 14* 13* 16  ALT 13 13 17   ALKPHOS 71 72 50  BILITOT 0.8 1.2 0.5  PROT 6.3* 6.3* 4.4*  ALBUMIN 3.5 3.2* 2.1*    Recent Labs  Lab 03/02/22 1356 03/03/22 0612  LIPASE 72* 25    Recent Labs  Lab 03/02/22 1400  AMMONIA <10    Coagulation Profile: No results for input(s): "INR", "PROTIME" in the last 168 hours. Cardiac Enzymes: No results for input(s): "CKTOTAL", "CKMB", "CKMBINDEX", "TROPONINI" in the last 168 hours. BNP (last 3 results) No results for input(s): "PROBNP" in the last 8760 hours. HbA1C: No results for input(s): "HGBA1C" in the last 72 hours. CBG: No results for input(s): "GLUCAP" in the last 168 hours. Lipid Profile: No results for input(s): "CHOL", "HDL", "LDLCALC", "TRIG", "CHOLHDL", "LDLDIRECT" in the last 72 hours. Thyroid Function Tests: No results for input(s): "TSH", "T4TOTAL", "FREET4", "T3FREE", "THYROIDAB" in the last 72 hours. Anemia Panel: No results for input(s): "VITAMINB12", "FOLATE",  "FERRITIN", "TIBC", "IRON", "RETICCTPCT" in the last 72 hours. Sepsis Labs: Recent Labs  Lab 03/02/22 1356 03/02/22 1716 03/02/22 1832 03/02/22 2054  LATICACIDVEN 1.7 3.0* 1.5 0.9     Recent Results (from the past 240 hour(s))  Blood culture (routine x 2)     Status: Abnormal (Preliminary result)   Collection Time: 03/02/22  2:01 PM   Specimen: Right Antecubital; Blood  Result Value Ref Range Status   Specimen Description   Final    RIGHT ANTECUBITAL BOTTLES DRAWN AEROBIC AND ANAEROBIC Performed at [facility], [address] [place], [place] [address]    Special Requests   Final    Blood Culture results may not be optimal due to an excessive volume of blood received in culture bottles Performed at [facility], [address], [place], [place] [address]    Culture  Setup Time   Final    GRAM NEGATIVE RODS AEROBIC BOTTLE ONLY Gram Stain Report Called to,Read Back By and Verified With:  T. ISLEY @ 1217 BY STEPHTR 03/03/22 Performed at [facility], [address] [place], [place] [address]    Culture PSEUDOMONAS AERUGINOSA (A)  Final   Report Status PENDING  Incomplete  Blood culture (routine x 2)     Status: Abnormal (Preliminary result)   Collection Time: 03/02/22  2:06 PM   Specimen: Left Antecubital; Blood  Result Value Ref Range Status   Specimen Description   Final    LEFT ANTECUBITAL BOTTLES DRAWN AEROBIC AND ANAEROBIC Performed at [facility], [address] [place], [place] [address]    Special Requests   Final    Blood Culture results may not be optimal due to an excessive volume of blood received in culture bottles Performed at [facility], [address], [place], [place] [address]    Culture  Setup Time   Final    GRAM NEGATIVE RODS Gram Stain Report Called to,Read Back By and Verified With:  T.ISLEY @ 1217 BY STEPHTR 03/03/22 AEROBIC BOTTLE ONLY Organism ID to follow CRITICAL RESULT CALLED TO, READ BACK BY AND VERIFIED WITH:  Alberteen Sam, RN 03/03/22  1619 A. LAFRANCE    Culture (A)  Final    PSEUDOMONAS AERUGINOSA SUSCEPTIBILITIES TO FOLLOW Performed at [facility], [place] [address] [place], [place] [address]    Report Status PENDING  Incomplete  Blood Culture ID Panel (Reflexed)     Status: Abnormal   Collection Time: 03/02/22  2:06 PM  Result  Value Ref Range Status   Enterococcus faecalis NOT DETECTED NOT DETECTED Final   Enterococcus Faecium NOT DETECTED NOT DETECTED Final   Listeria monocytogenes NOT DETECTED NOT DETECTED Final   Staphylococcus species NOT DETECTED NOT DETECTED Final   Staphylococcus aureus (BCID) NOT DETECTED NOT DETECTED Final   Staphylococcus epidermidis NOT DETECTED NOT DETECTED Final   Staphylococcus lugdunensis NOT DETECTED NOT DETECTED Final   Streptococcus species NOT DETECTED NOT DETECTED Final   Streptococcus agalactiae NOT DETECTED NOT DETECTED Final   Streptococcus pneumoniae NOT DETECTED NOT DETECTED Final   Streptococcus pyogenes NOT DETECTED NOT DETECTED Final   A.calcoaceticus-baumannii NOT DETECTED NOT DETECTED Final   Bacteroides fragilis NOT DETECTED NOT DETECTED Final   Enterobacterales NOT DETECTED NOT DETECTED Final   Enterobacter cloacae complex NOT DETECTED NOT DETECTED Final   Escherichia coli NOT DETECTED NOT DETECTED Final   Klebsiella aerogenes NOT DETECTED NOT DETECTED Final   Klebsiella oxytoca NOT DETECTED NOT DETECTED Final   Klebsiella pneumoniae NOT DETECTED NOT DETECTED Final   Proteus species NOT DETECTED NOT DETECTED Final   Salmonella species NOT DETECTED NOT DETECTED Final   Serratia marcescens NOT DETECTED NOT DETECTED Final   Haemophilus influenzae NOT DETECTED NOT DETECTED Final   Neisseria meningitidis NOT DETECTED NOT DETECTED Final   Pseudomonas aeruginosa DETECTED (A) NOT DETECTED Final    Comment: CRITICAL RESULT CALLED TO, READ BACK BY AND VERIFIED WITH:  Alberteen Sam, RN 03/03/22 1619 A. LAFRANCE     Stenotrophomonas maltophilia NOT DETECTED NOT  DETECTED Final   Candida albicans NOT DETECTED NOT DETECTED Final   Candida auris NOT DETECTED NOT DETECTED Final   Candida glabrata NOT DETECTED NOT DETECTED Final   Candida krusei NOT DETECTED NOT DETECTED Final   Candida parapsilosis NOT DETECTED NOT DETECTED Final   Candida tropicalis NOT DETECTED NOT DETECTED Final   Cryptococcus neoformans/gattii NOT DETECTED NOT DETECTED Final   CTX-M ESBL NOT DETECTED NOT DETECTED Final   Carbapenem resistance IMP NOT DETECTED NOT DETECTED Final   Carbapenem resistance KPC NOT DETECTED NOT DETECTED Final   Carbapenem resistance NDM NOT DETECTED NOT DETECTED Final   Carbapenem resistance VIM NOT DETECTED NOT DETECTED Final    Comment: Performed at [facility], [address], [place], [place] [address]  Resp Panel by RT-PCR (Flu A&B, Covid) Anterior Nasal Swab     Status: None   Collection Time: 03/02/22  3:08 PM   Specimen: Anterior Nasal Swab  Result Value Ref Range Status   SARS Coronavirus 2 by RT PCR NEGATIVE NEGATIVE Final    Comment: (NOTE) SARS-CoV-2 target nucleic acids are NOT DETECTED.  The SARS-CoV-2 RNA is generally detectable in upper respiratory specimens during the acute phase of infection. The lowest concentration of SARS-CoV-2 viral copies this assay can detect is 138 copies/mL. A negative result does not preclude SARS-Cov-2 infection and should not be used as the sole basis for treatment or other patient management decisions. A negative result may occur with  improper specimen collection/handling, submission of specimen other than nasopharyngeal swab, presence of viral mutation(s) within the areas targeted by this assay, and inadequate number of viral copies(<138 copies/mL). A negative result must be combined with clinical observations, patient history, and epidemiological information. The expected result is Negative.  Fact Sheet for Patients:  EntrepreneurPulse.com.au  Fact Sheet for Healthcare  Providers:  IncredibleEmployment.be  This test is no t yet approved or cleared by the [place] FDA and  has been authorized for detection  and/or diagnosis of SARS-CoV-2 by FDA under an Emergency Use Authorization (EUA). This EUA will remain  in effect (meaning this test can be used) for the duration of the COVID-19 declaration under Section 564(b)(1) of the Act, 21 U.S.C.section 360bbb-3(b)(1), unless the authorization is terminated  or revoked sooner.       Influenza A by PCR NEGATIVE NEGATIVE Final   Influenza B by PCR NEGATIVE NEGATIVE Final    Comment: (NOTE) The Xpert Xpress SARS-CoV-2/FLU/RSV plus assay is intended as an aid in the diagnosis of influenza from Nasopharyngeal swab specimens and should not be used as a sole basis for treatment. Nasal washings and aspirates are unacceptable for Xpert Xpress SARS-CoV-2/FLU/RSV testing.  Fact Sheet for Patients: EntrepreneurPulse.com.au  Fact Sheet for Healthcare Providers: IncredibleEmployment.be  This test is not yet approved or cleared by the [place] FDA and has been authorized for detection and/or diagnosis of SARS-CoV-2 by FDA under an Emergency Use Authorization (EUA). This EUA will remain in effect (meaning this test can be used) for the duration of the COVID-19 declaration under Section 564(b)(1) of the Act, 21 U.S.C. section 360bbb-3(b)(1), unless the authorization is terminated or revoked.  Performed at [facility], [address] [place], [place] [address]   Urine Culture     Status: None   Collection Time: 03/02/22  7:03 PM   Specimen: Urine, Clean Catch  Result Value Ref Range Status   Specimen Description   Final    URINE, CLEAN CATCH Performed at [facility], [address] [place], [place] [address]    Special Requests   Final    NONE Performed at [facility], [address], [place], [place] [address]    Culture   Final    NO  GROWTH Performed at [facility], [place] [address], [place], [place] [address]    Report Status 03/04/2022 FINAL  Final         Radiology Studies: No results found.      Scheduled Meds:  apixaban  5 mg Oral BID   buPROPion  150 mg Oral Daily   Chlorhexidine Gluconate Cloth  6 each Topical Daily   megestrol  400 mg Oral BID   metoprolol tartrate  37.5 mg Oral BID   midodrine  5 mg Oral TID WC   nystatin  5 mL Oral QID   oxyCODONE  20 mg Oral Q12H   pravastatin  20 mg Oral q1800   senna-docusate  2 tablet Oral BID   Tbo-filgastrim (GRANIX) SQ  300 mcg Subcutaneous q1800   valACYclovir  500 mg Oral Daily   Continuous Infusions:  sodium chloride 100 mL/hr at 03/04/22 2332   ceFEPime (MAXIPIME) IV 2 g (03/05/22 0622)          Aline August, MD [facility] 03/05/2022, 7:47 AM

## 2022-03-05 NOTE — Progress Notes (Signed)
Pharmacy Antibiotic Note  Alfred Brown is a 72 y.o. male admitted on 03/02/2022 with neutropenic fever/pseudomonas bacteremia  Pharmacy has been consulted for cefepime dosing.  Plan: Cefepime 2 grams IV every 8 hours Monitor clinical progress, cultures/sensitivities, renal function, abx plan  Height: 6\' 1"  (185.4 cm) Weight: 69.9 kg (154 lb 1.6 oz) IBW/kg (Calculated) : 79.9  Temp (24hrs), Avg:98.1 F (36.7 C), Min:97.8 F (36.6 C), Max:98.4 F (36.9 C)  Recent Labs  Lab 02/27/22 0821 03/02/22 1356 03/02/22 1716 03/02/22 1832 03/02/22 2054 03/03/22 0612 03/04/22 0607 03/05/22 0631  WBC 32.8* 26.5*  --   --   --  6.2 5.9 8.5  CREATININE 0.86 1.06  --   --   --  1.00 0.70 0.49*  LATICACIDVEN  --  1.7 3.0* 1.5 0.9  --   --   --      Estimated Creatinine Clearance: 84.9 mL/min (A) (by C-G formula based on SCr of 0.49 mg/dL (L)).    No Known Allergies  Antimicrobials this admission: 12/2 Cefepime >>  12/2 Flagyl  12/2 vanc >>12/3      Microbiology results: 12/2 BCx: pseudomonas- sensitive to cipro 12/2 UCx: ng   Margot Ables, PharmD Clinical Pharmacist 03/05/2022 2:54 PM

## 2022-03-05 NOTE — Progress Notes (Signed)
Patient has been confused this shift. He can't remember he is in the hospital. Wife is in the room redirecting him. I notified the doctor that he has white patches on the back of his throat and he complained of throat pain. New orders received

## 2022-03-05 NOTE — Progress Notes (Signed)
Rounding Note    Patient Name: Alfred Brown [facility] Date of Encounter: 03/05/2022  [facility] Cardiologist: Carlyle Dolly, MD   Subjective   No complaints  Inpatient Medications    Scheduled Meds:  apixaban  5 mg Oral BID   buPROPion  150 mg Oral Daily   Chlorhexidine Gluconate Cloth  6 each Topical Daily   megestrol  400 mg Oral BID   metoprolol tartrate  37.5 mg Oral BID   midodrine  5 mg Oral TID WC   nystatin  5 mL Oral QID   oxyCODONE  20 mg Oral Q12H   potassium chloride  40 mEq Oral Q4H   pravastatin  20 mg Oral q1800   senna-docusate  2 tablet Oral BID   Tbo-filgastrim (GRANIX) SQ  300 mcg Subcutaneous q1800   valACYclovir  500 mg Oral Daily   Continuous Infusions:  sodium chloride 100 mL/hr at 03/04/22 2332   ceFEPime (MAXIPIME) IV 2 g (03/05/22 0622)   magnesium sulfate bolus IVPB     PRN Meds: acetaminophen **OR** acetaminophen, albuterol, guaiFENesin-dextromethorphan, loperamide, oxyCODONE, phenol, polyethylene glycol, prochlorperazine   Vital Signs    Vitals:   03/04/22 1634 03/04/22 2045 03/05/22 0501 03/05/22 0654  BP: 114/70 116/74 105/84   Pulse: 95 89 94   Resp: 14 19 19    Temp: 98.4 F (36.9 C) 97.8 F (36.6 C) 98 F (36.7 C)   TempSrc: Oral Oral    SpO2: 100% 99% 99%   Weight:    69.9 kg  Height:       No intake or output data in the 24 hours ending 03/05/22 0917    03/05/2022    6:54 AM 03/04/2022    5:00 AM 03/03/2022    1:00 AM  Last 3 Weights  Weight (lbs) 154 lb 1.6 oz 141 lb 1.5 oz 141 lb 12.1 oz  Weight (kg) 69.9 kg 64 kg 64.3 kg      Telemetry    SR, PACs, short runs SVT - Personally Reviewed  ECG    N/a - Personally Reviewed  Physical Exam   GEN: No acute distress.   Neck: No JVD Cardiac: RRR, no murmurs, rubs, or gallops.  Respiratory: Clear to auscultation bilaterally. GI: Soft, nontender, non-distended  MS: No edema; No deformity. Neuro:  Nonfocal  Psych: Normal affect   Labs    High  Sensitivity Troponin:   Recent Labs  Lab 03/02/22 1440 03/02/22 1716  TROPONINIHS 17 20*     Chemistry Recent Labs  Lab 02/27/22 0821 03/02/22 1356 03/03/22 0612 03/04/22 0607 03/05/22 0631  NA 136 135 134* 135 136  K 3.5 3.7 3.7 2.8* 3.1*  CL 105 103 106 108 111  CO2 20* 19* 21* 22 19*  GLUCOSE 113* 118* 150* 108* 83  BUN 20 30* 28* 20 13  CREATININE 0.86 1.06 1.00 0.70 0.49*  CALCIUM 9.4 9.2 8.2* 8.1* 8.1*  MG 1.7 1.6*  --  1.7 1.3*  PROT 6.3* 6.3*  --  4.4*  --   ALBUMIN 3.5 3.2*  --  2.1*  --   AST 14* 13*  --  16  --   ALT 13 13  --  17  --   ALKPHOS 71 72  --  50  --   BILITOT 0.8 1.2  --  0.5  --   GFRNONAA >60 >60 >60 >60 >60  ANIONGAP 11 13 7 5 6     Lipids No results for input(s): "CHOL", "TRIG", "HDL", "  LABVLDL", "LDLCALC", "CHOLHDL" in the last 168 hours.  Hematology Recent Labs  Lab 03/03/22 0612 03/04/22 0607 03/05/22 0631  WBC 6.2 5.9 8.5  RBC 2.76* 2.63* 2.65*  HGB 8.5* 8.0* 7.8*  HCT 26.0* 25.0* 25.1*  MCV 94.2 95.1 94.7  MCH 30.8 30.4 29.4  MCHC 32.7 32.0 31.1  RDW 16.7* 16.8* 16.7*  PLT 111* 106* 142*   Thyroid  Recent Labs  Lab 02/27/22 0821  TSH 2.915    BNP Recent Labs  Lab 03/02/22 1440  BNP 250.0*    DDimer No results for input(s): "DDIMER" in the last 168 hours.   Radiology    No results found.  Cardiac Studies     Patient Profile     Alfred Brown is a 71 y.o. male with a hx of  history significant of  palpitations, SVT, possible bicuspid Aortic valve with mild to mod AS, CLL, stage IV lung cancer with bony metastases currently on chemotherapy and has received radiation, hypertension, hyperlipidemia, COPD admitted with pseudomonas bacteremia and neutropenic fever  who is being seen 03/04/2022 for the evaluation of Afib with RVR at the request of Dr. Starla Link.   Assessment & Plan    1.Tachycardia -  history of PACs, prior SVT - during this admit in setting of neutropenic fever issues with tachcyardia - tele and EKGs  reviewed, by far vast majority is sinus tach and runs of SVT. I do think had brief afib episode, in setting of neutropenic fever unclear if transient or will be long term issue. - he is on lopressor 37.5mg  bid. Soft bp's at times. With lung disease would avoid amio. Started midodrine 5 mg tid. - CHADS2Vasc score is 3 on eliquis already due to prior PE during 12/2021 admission     - started midodrine 5mg  tid, can titrate. Continued lopressor 37.5mg  bid. Heart rates and bp's look good today, continue to monitor.    Would plan for outpatient monitor once infection has resolved, would arrange at outpatient f/u.     2. Possible PE - defer duration of anticoagulation to pcp and heme/onc  For questions or updates, please contact [place] Please consult [email or web address] for contact info under        Signed, Carlyle Dolly, MD  03/05/2022, 9:17 AM

## 2022-03-05 NOTE — Progress Notes (Signed)
Date and time results received: 03/05/22 7:34 AM   (use smartphrase ".now" to insert current time)  Test: neut # Critical Value: 0.0  Name of Provider Notified: Starla Link, MD  Orders Received? Or Actions Taken?:

## 2022-03-05 NOTE — Consult Note (Signed)
Consultation Note Date: 03/05/2022   Patient Name: [facility]  DOB: Feb 21, 1951  MRN: [id number]  Age / Sex: 71 y.o., male  PCP: Redmond School, MD Referring Physician: Aline August, MD  Reason for Consultation:  goals of care  HPI/Patient Profile: 71 y.o. male  with past medical history of lung cancer metastatic to bone-currently on [place] admitted on 03/02/2022 with Pseudomonas bacteremia in the setting of neutropenia.  Having some intermittent confusion this admission.  Palliative consulted for goals of care.  Primary Decision Maker NEXT OF KIN - spouse- Alfred Brown  Discussion: I have reviewed medical records including Care Everywhere, progress notes from this and prior admissions, labs and imaging, discussed with RN.  On evaluation patient is lethargic.  Met with his spouse Alfred Brown at the bedside.  Alfred Brown recalls meeting with palliative during Alfred Brown's admission in October.   As far as functional and nutritional status-prior to this admission Jorrell was living at home, he was independent.  He has been having some decline with his treatments causing a great symptom burden.  We discussed patient's current illness and what it means in the larger context of patient's on-going co-morbidities.  Natural disease trajectory and expectations at EOL were discussed.  Alfred Brown shares that she is uncertain if Luman will be able to continue treatment for his lung cancer.  We discussed if he is unable to continue the treatments, then he will be facing end-of-life.  Alfred Brown would not want hospice support if patient is at end-of-life.  She has had bad experiences in the past with hospice.  When asked what other plans could be made to help care for Herve at the end of his life Alfred Brown was unsure, and said that they will cross that bridge when they get there.  I encouraged Alfred Brown to begin to think about how Josejulian can best  be cared for in the future as part of advance care planning that can help decrease the stress of chronic illness.  Advance directives were discussed.  She may has not wanted to complete a living will.  He consistently expresses his desire to be full code.  Alfred Brown understands the implications of CPR in the face of a terminal illness.  But she does want to honor Denzal's wishes.  Discussed with patient/family the importance of continued conversation with family and the medical providers regarding overall plan of care and treatment options, ensuring decisions are within the context of the patient's values and GOCs.      SUMMARY OF RECOMMENDATIONS -Full scope, full code -GOC are to stabilize and d/c home    Code Status/Advance Care Planning: Full code   Prognosis:   Unable to determine  Discharge Planning:  Home  Primary Diagnoses: Present on Admission:  Neutropenic fever ([place])  Chronic lymphocytic leukemia ([place])  COPD (chronic obstructive pulmonary disease) (HCC)  Squamous cell lung cancer, left (HCC)  Failure to thrive in adult  Prolonged QT interval  GERD (gastroesophageal reflux disease)  Cancer associated pain  PSVT (paroxysmal supraventricular tachycardia)   Review  of Systems  Unable to perform ROS: Mental status change    Physical Exam Vitals and nursing note reviewed.  Constitutional:      Appearance: He is ill-appearing.  Pulmonary:     Effort: Pulmonary effort is normal.  Skin:    Coloration: Skin is pale.     Vital Signs: BP 105/84 (BP Location: Left Arm)   Pulse 94   Temp 98 F (36.7 C)   Resp 19   Ht _0  (1.854 m)   Wt 69.9 kg   SpO2 99%   BMI 20.33 kg/m  Pain Scale: 0-10   Pain Score: 0-No pain   SpO2: SpO2: 99 % O2 Device:SpO2: 99 % O2 Flow Rate: .O2 Flow Rate (L/min): 0 L/min  IO: Intake/output summary: No intake or output data in the 24 hours ending 03/05/22 1217  LBM: Last BM Date : 03/04/22 Baseline Weight: Weight: 62.1 kg Most  recent weight: Weight: 69.9 kg       Thank you for this consult. Palliative medicine will continue to follow and assist as needed.   Greater than 50%  of this time was spent counseling and coordinating care related to the above assessment and plan.  Signed by: Mariana Kaufman, AGNP-C Palliative Medicine    Please contact Palliative Medicine Team phone at [phone] for questions and concerns.  For individual provider: See Shea Evans

## 2022-03-06 DIAGNOSIS — D709 Neutropenia, unspecified: Secondary | ICD-10-CM | POA: Diagnosis not present

## 2022-03-06 DIAGNOSIS — I471 Supraventricular tachycardia, unspecified: Secondary | ICD-10-CM | POA: Diagnosis not present

## 2022-03-06 DIAGNOSIS — J449 Chronic obstructive pulmonary disease, unspecified: Secondary | ICD-10-CM | POA: Diagnosis not present

## 2022-03-06 DIAGNOSIS — I350 Nonrheumatic aortic (valve) stenosis: Secondary | ICD-10-CM

## 2022-03-06 DIAGNOSIS — I351 Nonrheumatic aortic (valve) insufficiency: Secondary | ICD-10-CM

## 2022-03-06 DIAGNOSIS — K219 Gastro-esophageal reflux disease without esophagitis: Secondary | ICD-10-CM

## 2022-03-06 DIAGNOSIS — R5081 Fever presenting with conditions classified elsewhere: Secondary | ICD-10-CM | POA: Diagnosis not present

## 2022-03-06 DIAGNOSIS — C3492 Malignant neoplasm of unspecified part of left bronchus or lung: Secondary | ICD-10-CM | POA: Diagnosis not present

## 2022-03-06 LAB — CBC WITH DIFFERENTIAL/PLATELET
Abs Immature Granulocytes: 0.02 10*3/uL (ref 0.00–0.07)
Basophils Absolute: 0 10*3/uL (ref 0.0–0.1)
Basophils Relative: 0 %
Eosinophils Absolute: 0 10*3/uL (ref 0.0–0.5)
Eosinophils Relative: 0 %
HCT: 24.1 % — ABNORMAL LOW (ref 39.0–52.0)
Hemoglobin: 7.6 g/dL — ABNORMAL LOW (ref 13.0–17.0)
Immature Granulocytes: 0 %
Lymphocytes Relative: 98 %
Lymphs Abs: 9 10*3/uL — ABNORMAL HIGH (ref 0.7–4.0)
MCH: 29.8 pg (ref 26.0–34.0)
MCHC: 31.5 g/dL (ref 30.0–36.0)
MCV: 94.5 fL (ref 80.0–100.0)
Monocytes Absolute: 0.2 10*3/uL (ref 0.1–1.0)
Monocytes Relative: 2 %
Neutro Abs: 0 10*3/uL — CL (ref 1.7–7.7)
Neutrophils Relative %: 0 %
Platelets: 165 10*3/uL (ref 150–400)
RBC: 2.55 MIL/uL — ABNORMAL LOW (ref 4.22–5.81)
RDW: 16.9 % — ABNORMAL HIGH (ref 11.5–15.5)
WBC: 9.1 10*3/uL (ref 4.0–10.5)
nRBC: 0 % (ref 0.0–0.2)

## 2022-03-06 LAB — COMPREHENSIVE METABOLIC PANEL
ALT: 18 U/L (ref 0–44)
AST: 16 U/L (ref 15–41)
Albumin: 1.9 g/dL — ABNORMAL LOW (ref 3.5–5.0)
Alkaline Phosphatase: 44 U/L (ref 38–126)
Anion gap: 5 (ref 5–15)
BUN: 9 mg/dL (ref 8–23)
CO2: 20 mmol/L — ABNORMAL LOW (ref 22–32)
Calcium: 7.7 mg/dL — ABNORMAL LOW (ref 8.9–10.3)
Chloride: 109 mmol/L (ref 98–111)
Creatinine, Ser: 0.53 mg/dL — ABNORMAL LOW (ref 0.61–1.24)
GFR, Estimated: >60 mL/min (ref >60)
Glucose, Bld: 81 mg/dL (ref 70–99)
Potassium: 2.9 mmol/L — ABNORMAL LOW (ref 3.5–5.1)
Sodium: 134 mmol/L — ABNORMAL LOW (ref 135–145)
Total Bilirubin: 0.6 mg/dL (ref 0.3–1.2)
Total Protein: 4.1 g/dL — ABNORMAL LOW (ref 6.5–8.1)

## 2022-03-06 LAB — MAGNESIUM: Magnesium: 1.5 mg/dL — ABNORMAL LOW (ref 1.7–2.4)

## 2022-03-06 MED ORDER — OXYBUTYNIN CHLORIDE 5 MG PO TABS
2.5000 mg | ORAL_TABLET | Freq: Two times a day (BID) | ORAL | Status: DC
  Administered 2022-03-06 – 2022-03-10 (×9): 2.5 mg via ORAL
  Filled 2022-03-06 (×9): qty 1

## 2022-03-06 MED ORDER — TBO-FILGRASTIM 480 MCG/0.8ML ~~LOC~~ SOSY
480.0000 ug | PREFILLED_SYRINGE | Freq: Every day | SUBCUTANEOUS | Status: DC
  Administered 2022-03-06 – 2022-03-12 (×7): 480 ug via SUBCUTANEOUS
  Filled 2022-03-06 (×10): qty 0.8

## 2022-03-06 MED ORDER — WITCH HAZEL-GLYCERIN EX PADS: MEDICATED_PAD | CUTANEOUS | Status: DC | PRN

## 2022-03-06 NOTE — Progress Notes (Signed)
Removed patients foley , tolerated well.

## 2022-03-06 NOTE — Progress Notes (Addendum)
Progress Note  Patient Name: Empire Date of Encounter: 03/06/2022  Primary Cardiologist: Carlyle Dolly, MD  Subjective   No acute events overnight.  HR is controlled overnight but this morning went up to 120s.  Patient resting well and has no symptoms.    Inpatient Medications    Scheduled Meds:  apixaban  5 mg Oral BID   buPROPion  150 mg Oral Daily   Chlorhexidine Gluconate Cloth  6 each Topical Daily   megestrol  400 mg Oral BID   metoprolol tartrate  37.5 mg Oral BID   midodrine  5 mg Oral TID WC   nystatin  5 mL Oral QID   oxybutynin  2.5 mg Oral BID   oxyCODONE  20 mg Oral Q12H   pravastatin  20 mg Oral q1800   senna-docusate  2 tablet Oral BID   Tbo-filgastrim (GRANIX) SQ  300 mcg Subcutaneous q1800   valACYclovir  500 mg Oral Daily   Continuous Infusions:  sodium chloride 100 mL/hr at 03/06/22 0915   ceFEPime (MAXIPIME) IV 2 g (03/06/22 0507)   PRN Meds: acetaminophen **OR** acetaminophen, albuterol, guaiFENesin-dextromethorphan, loperamide, oxyCODONE, phenol, polyethylene glycol, prochlorperazine, prochlorperazine, witch hazel-glycerin   Vital Signs    Vitals:   03/05/22 1435 03/05/22 2051 03/06/22 0456 03/06/22 0602  BP: 98/66 113/72 102/70   Pulse: 93 93 90   Resp: 17 19 19    Temp: 98.2 F (36.8 C) 98.1 F (36.7 C) 97.6 F (36.4 C)   TempSrc: Oral Oral Oral   SpO2: 98% 99% 98%   Weight:    73.5 kg  Height:        Intake/Output Summary (Last 24 hours) at 03/06/2022 1159 Last data filed at 03/06/2022 0603 Gross per 24 hour  Intake 4174.36 ml  Output 1000 ml  Net 3174.36 ml   Filed Weights   03/04/22 0500 03/05/22 0654 03/06/22 0602  Weight: 64 kg 69.9 kg 73.5 kg    Telemetry     Personally reviewed, sinus tachycardia with PAC and PVCs.  There appears to be transient atrial fibrillation however it cannot be completely ruled out.  ECG    Normal sinus rhythm  Physical Exam   GEN: No acute distress.   Neck: No  JVD. Cardiac: RRR, no murmur, rub, or gallop.  Respiratory: Nonlabored. Clear to auscultation bilaterally. GI: Soft, nontender, bowel sounds present. MS: No edema; No deformity. Neuro:  Nonfocal. Psych: Alert and oriented x 3. Normal affect.  Labs    Chemistry Recent Labs  Lab 03/02/22 1356 03/03/22 0612 03/04/22 0607 03/05/22 0631 03/06/22 0505  NA 135   < > 135 136 134*  K 3.7   < > 2.8* 3.1* 2.9*  CL 103   < > 108 111 109  CO2 19*   < > 22 19* 20*  GLUCOSE 118*   < > 108* 83 81  BUN 30*   < > 20 13 9   CREATININE 1.06   < > 0.70 0.49* 0.53*  CALCIUM 9.2   < > 8.1* 8.1* 7.7*  PROT 6.3*  --  4.4*  --  4.1*  ALBUMIN 3.2*  --  2.1*  --  1.9*  AST 13*  --  16  --  16  ALT 13  --  17  --  18  ALKPHOS 72  --  50  --  44  BILITOT 1.2  --  0.5  --  0.6  GFRNONAA >60   < > >60 >60 >60  ANIONGAP 13   < > 5 6 5    < > = values in this interval not displayed.     Hematology Recent Labs  Lab 03/04/22 0607 03/05/22 0631 03/06/22 0505  WBC 5.9 8.5 9.1  RBC 2.63* 2.65* 2.55*  HGB 8.0* 7.8* 7.6*  HCT 25.0* 25.1* 24.1*  MCV 95.1 94.7 94.5  MCH 30.4 29.4 29.8  MCHC 32.0 31.1 31.5  RDW 16.8* 16.7* 16.9*  PLT 106* 142* 165    Cardiac Enzymes Recent Labs  Lab 03/02/22 1440 03/02/22 1716  TROPONINIHS 17 20*    BNP Recent Labs  Lab 03/02/22 1440  BNP 250.0*     DDimerNo results for input(s): "DDIMER" in the last 168 hours.   Radiology    No results found.  Cardiac Studies   Echo from 01/21/2022 LVEF 60 to 65% Moderate LVH  Echo from 10/2021 LVEF preserved Moderate aortic regurgitation and moderate aortic stenosis   Assessment & Plan    Patient is a 71 year old M known to have SVT, possible bicuspid aortic valve with mild to moderate aortic valve stenosis, CLL, stage IV lung cancer with metastasis currently on chemotherapy and radiation, HTN, hyperlipidemia, COPD admitted with Pseudomonas bacteremia and neutropenic fever was consulted initially for A-fib  with RVR at the request of Dr. Starla Link.  # Sinus tachycardia with PAC and PVCs # Transient paroxysmal atrial fibrillation -Patient has sinus tachycardia and transient paroxysmal atrial fibrillation in the setting of infection. Control underlying etiology, Pseudomonas bacteremia. Continue metoprolol tartrate 37.5 mg twice daily.  Can uptitrate metoprolol if heart rate continues to be more than 100 bpm as long as blood pressure tolerates.  Currently on midodrine 5 mg 3 times daily due to soft blood pressures. -He will need 2-week event monitor upon discharge to confirm the presence and duration of atrial fibrillation. Regardless, he is already on Eliquis 5 mg twice daily due to prior PE.  # Possible PE -Continue Eliquis 5 mg twice daily  # Moderate aortic valve stenosis, 2023 echo # Moderate aortic valve regurgitation, 2023 echo -Repeat echo in 1 year -Conservative management -I do not think he will be a candidate for any surgical valve replacement.  I have spent a total of 33 minutes with patient reviewing chart , telemetry, EKGs, labs and examining patient as well as establishing an assessment and plan that was discussed with the patient.  > 50% of time was spent in direct patient care.     Signed, Chalmers Guest, MD  03/06/2022, 11:59 AM

## 2022-03-06 NOTE — Plan of Care (Signed)

## 2022-03-06 NOTE — Progress Notes (Signed)
Foley patent with yellow urine, pt reporting frequent feelings of needing to void.  Reviewed with provider, orders received for one dose of ditropan.  Pt reports lessening of symptoms after medication.   Tolerating cefepime.  Pt given prn oxycodone x 1 for shoulder/neck and rectal pain with good relief.  Thrush improving with use of nystatin.  Mild nausea reported, pt declined need for medication, however received order for prn compazine as pt has this available at home as needed.  Port dressing CDI.  Protective precautions in place, pt afebrile this shift.  Wife at bedside.

## 2022-03-06 NOTE — Consult Note (Signed)
[facility] Progress Note  Name: Alfred Brown      MRN: [id number]    Location: I680/H212-24  Date: 03/06/2022 Time:5:20 PM   Subjective: Interval History:Alfred Brown is seen for follow-up of metastatic lung cancer, Pseudomonas bacteremia and neutropenic fever.  He reports that he is overall feeling better.  Rectal pain has improved.  Foley catheter has been removed.  He is not able to eat much.  Objective: Vital signs in last 24 hours: Temp:  [97.6 F (36.4 C)-98.1 F (36.7 C)] 97.9 F (36.6 C) (12/06 1253) Pulse Rate:  [90-95] 95 (12/06 1253) Resp:  [17-19] 17 (12/06 1253) BP: (100-113)/(63-72) 100/63 (12/06 1253) SpO2:  [98 %-100 %] 100 % (12/06 1253) Weight:  [162 lb 0.6 oz (73.5 kg)] 162 lb 0.6 oz (73.5 kg) (12/06 0602)    Intake/Output from previous day: 12/05 0701 - 12/06 0700 In: 4174.4 [I.V.:3719.6] Out: 1000 [Urine:1000]    Intake/Output this shift: No intake/output data recorded.   PHYSICAL EXAM: BP 100/63 (BP Location: Right Arm)   Pulse 95   Temp 97.9 F (36.6 C)   Resp 17   Ht 6' 1" (1.854 m)   Wt 162 lb 0.6 oz (73.5 kg)   SpO2 100%   BMI 21.38 kg/m  General appearance: alert, cooperative, and appears stated age Lungs: clear to auscultation bilaterally Abdomen:  Soft, nontender Extremities:  No edema cyanosis   Studies/Results: Results for orders placed or performed during the hospital encounter of 03/02/22 (from the past 48 hour(s))  Basic metabolic panel     Status: Abnormal   Collection Time: 03/05/22  6:31 AM  Result Value Ref Range   Sodium 136 135 - 145 mmol/L   Potassium 3.1 (L) 3.5 - 5.1 mmol/L   Chloride 111 98 - 111 mmol/L   CO2 19 (L) 22 - 32 mmol/L   Glucose, Bld 83 70 - 99 mg/dL    Comment: Glucose reference range applies only to samples taken after fasting for at least 8 hours.   BUN 13 8 - 23 mg/dL   Creatinine, Ser 0.49 (L) 0.61 - 1.24 mg/dL   Calcium 8.1 (L) 8.9 - 10.3 mg/dL   GFR, Estimated >60 >60  mL/min    Comment: (NOTE) Calculated using the CKD-EPI Creatinine Equation (2021)    Anion gap 6 5 - 15    Comment: Performed at [facility], [address] [place], [place] [address]  Magnesium     Status: Abnormal   Collection Time: 03/05/22  6:31 AM  Result Value Ref Range   Magnesium 1.3 (L) 1.7 - 2.4 mg/dL    Comment: Performed at [facility], [address] [place],  [address]  CBC with Differential/Platelet     Status: Abnormal   Collection Time: 03/05/22  6:31 AM  Result Value Ref Range   WBC 8.5 4.0 - 10.5 K/uL   RBC 2.65 (L) 4.22 - 5.81 MIL/uL   Hemoglobin 7.8 (L) 13.0 - 17.0 g/dL   HCT 25.1 (L) 39.0 - 52.0 %   MCV 94.7 80.0 - 100.0 fL   MCH 29.4 26.0 - 34.0 pg   MCHC 31.1 30.0 - 36.0 g/dL   RDW 16.7 (H) 11.5 - 15.5 %   Platelets 142 (L) 150 - 400 K/uL   nRBC 0.0 0.0 - 0.2 %   Neutrophils Relative % 0 %   Neutro Abs 0.0 (LL) 1.7 - 7.7 K/uL    Comment: This critical result has verified and been  called to BROWN,S by Lorette Ang on 12 05 2023 at [place], and has been read back.    Lymphocytes Relative 99 %   Lymphs Abs 8.5 (H) 0.7 - 4.0 K/uL   Monocytes Relative 1 %   Monocytes Absolute 0.1 0.1 - 1.0 K/uL   Eosinophils Relative 0 %   Eosinophils Absolute 0.0 0.0 - 0.5 K/uL   Basophils Relative 0 %   Basophils Absolute 0.0 0.0 - 0.1 K/uL   WBC Morphology ABSOLUTE LYMPHOCYTOSIS    RBC Morphology MORPHOLOGY UNREMARKABLE    Smear Review MORPHOLOGY UNREMARKABLE    Immature Granulocytes 0 %   Abs Immature Granulocytes 0.00 0.00 - 0.07 K/uL   Smudge Cells PRESENT     Comment: Performed at [facility], [address], [place], [place] [address]  Lactate dehydrogenase     Status: Abnormal   Collection Time: 03/05/22  6:31 AM  Result Value Ref Range   LDH 64 (L) 98 - 192 U/L    Comment: Performed at [facility], [address], [place], [place] [address]  CBC with Differential/Platelet     Status: Abnormal   Collection Time: 03/06/22  5:05 AM  Result Value Ref  Range   WBC 9.1 4.0 - 10.5 K/uL   RBC 2.55 (L) 4.22 - 5.81 MIL/uL   Hemoglobin 7.6 (L) 13.0 - 17.0 g/dL   HCT 24.1 (L) 39.0 - 52.0 %   MCV 94.5 80.0 - 100.0 fL   MCH 29.8 26.0 - 34.0 pg   MCHC 31.5 30.0 - 36.0 g/dL   RDW 16.9 (H) 11.5 - 15.5 %   Platelets 165 150 - 400 K/uL   nRBC 0.0 0.0 - 0.2 %   Neutrophils Relative % 0 %   Neutro Abs 0.0 (LL) 1.7 - 7.7 K/uL    Comment: REPEATED TO VERIFY THIS CRITICAL RESULT HAS VERIFIED AND BEEN CALLED TO COLEMAN,L LPN BY BOBBIE MATTHEWS ON 12 06 2023 AT 0701, AND HAS BEEN READ BACK.     Lymphocytes Relative 98 %   Lymphs Abs 9.0 (H) 0.7 - 4.0 K/uL   Monocytes Relative 2 %   Monocytes Absolute 0.2 0.1 - 1.0 K/uL   Eosinophils Relative 0 %   Eosinophils Absolute 0.0 0.0 - 0.5 K/uL   Basophils Relative 0 %   Basophils Absolute 0.0 0.0 - 0.1 K/uL   WBC Morphology ABSOLUTE LYMPHOCYTOSIS    RBC Morphology MORPHOLOGY UNREMARKABLE    Smear Review MORPHOLOGY UNREMARKABLE    Immature Granulocytes 0 %   Abs Immature Granulocytes 0.02 0.00 - 0.07 K/uL   Smudge Cells PRESENT     Comment: Performed at [facility], [address], [place], [place] [address]  Comprehensive metabolic panel     Status: Abnormal   Collection Time: 03/06/22  5:05 AM  Result Value Ref Range   Sodium 134 (L) 135 - 145 mmol/L   Potassium 2.9 (L) 3.5 - 5.1 mmol/L   Chloride 109 98 - 111 mmol/L   CO2 20 (L) 22 - 32 mmol/L   Glucose, Bld 81 70 - 99 mg/dL    Comment: Glucose reference range applies only to samples taken after fasting for at least 8 hours.   BUN 9 8 - 23 mg/dL   Creatinine, Ser 0.53 (L) 0.61 - 1.24 mg/dL   Calcium 7.7 (L) 8.9 - 10.3 mg/dL   Total Protein 4.1 (L) 6.5 - 8.1 g/dL   Albumin 1.9 (L) 3.5 - 5.0 g/dL   AST 16 15 - 41  U/L   ALT 18 0 - 44 U/L   Alkaline Phosphatase 44 38 - 126 U/L   Total Bilirubin 0.6 0.3 - 1.2 mg/dL   GFR, Estimated >60 >60 mL/min    Comment: (NOTE) Calculated using the CKD-EPI Creatinine Equation (2021)    Anion gap 5 5 - 15     Comment: Performed at [facility], [address], [place], [place] [address]  Magnesium     Status: Abnormal   Collection Time: 03/06/22  5:05 AM  Result Value Ref Range   Magnesium 1.5 (L) 1.7 - 2.4 mg/dL    Comment: Performed at [facility], [address] [place], [place] [address]  Culture, blood (Routine X 2) w Reflex to ID Panel     Status: None (Preliminary result)   Collection Time: 03/06/22  1:48 PM   Specimen: BLOOD RIGHT HAND  Result Value Ref Range   Specimen Description BLOOD RIGHT HAND    Special Requests      BOTTLES DRAWN AEROBIC AND ANAEROBIC Blood Culture adequate volume Performed at [facility], [address] [place], [place] [address]    Culture PENDING    Report Status PENDING    No results found.   MEDICATIONS: I have reviewed the patient's current medications.     Assessment/Plan:  1.  Pseudomonas bacteremia: - Sensitivities show pansensitive Pseudomonas. - Continue cefepime.  Repeat cultures were sent today. - Would recommend ID opinion if port needs to be removed. - Follow-up on immunoglobulin levels.  If they are severely low, consider IVIG 1 g/kg.  2.  Severe neutropenia: - He is receiving Granix 300 mcg daily.  ANC still low at 0.0.  This is from sepsis. - Will increase Granix to 480 mcg daily.  Continue until [place] more than 1000.  3.  Metastatic squamous cell lung cancer to the bones, PD-L1 40%, TMB high: - Last Keytruda on 02/27/2022.  4.  Malnutrition: - Continue Megace twice daily. - Advised the patient to increase oral intake.  5.  Possible PE: - Continue Eliquis twice daily.  All questions were answered. The patient knows to call the clinic with any problems, questions or concerns. We can certainly see the patient much sooner if necessary.     Derek Jack

## 2022-03-06 NOTE — Progress Notes (Signed)
Date and time results received: 03/06/22 0714 (use smartphrase ".now" to insert current time)  Test:  CBC  Critical Value: Neutrophil 0.0  Name of Provider Notified: Dr. Dyann Kief  Orders Received? Or Actions Taken?: No new orders at thist ime, no change from yesterday labs

## 2022-03-06 NOTE — Progress Notes (Signed)
PROGRESS NOTE    [facility]  HYQ:[id number] DOB: [birth year] DOA: 03/02/2022 PCP: Redmond School, MD   Brief Narrative:  71 y.o. male with medical history significant of CLL, stage IV lung cancer with bony metastases currently on chemotherapy and has received radiation, hypertension, hyperlipidemia, COPD presented with rectal pain and hematuria with increasing confusion.  On presentation, he was found to be neutropenic.  CT of abdomen and pelvis with contrast showed acute pancreatitis but no abscess or pseudocyst along with right iliac bone infiltrative lytic masses consistent with metastases.  CT of the head without contrast was negative for any acute intracranial abnormality.  He was started on IV fluids, antibiotics and Granix.  Assessment & Plan:   Pseudomonas bacteremia -Sensitivities demonstrating pansensitive microorganism Vancomycin has been discontinued. -Will repeat blood cultures today.  If persistently bacteremic, might have to consider removing of port. -She has remained afebrile.  Neutropenic fever -Presented with neutrophils of 0.  Currently on Granix as per Dr. Delton Coombes who recommended to continue Granix till [facility] is more than 1000.  Antibiotic plan as above.  Imaging study as above  Stage IV lung cancer with bony metastasis/CLL -With cancer associated chronic pain -Follows up with Dr. Delton Coombes as an outpatient.  Has received radiation treatment recently.  Also undergoing chemotherapy.  Follow further recommendations from oncology. -Palliative care consultation appreciated; continue full code and full scope of practice. -Currently on scheduled OxyContin along with as needed oxycodone.  Oral thrush -Complains of sore throat and has evidence of oral thrush.  -Continue treatment with denies statin  Paroxysmal A-fib with RVR -Patient had episodes of A-fib with RVR on 03/03/2022 requiring oral and IV metoprolol.   -Rate currently controlled.  -Appreciate cardiology  following and recommendations.   -Continue Eliquis for secondary prevention..  Elevated lipase -Imaging studies showed acute pancreatitis but clinically patient does not have any symptoms or signs of pancreatitis with benign abdominal appreciated on examination -Most likely acute phase reactant.  Failure to thrive/Severe malnutrition -Nutrition following.   -Continue Megace. -Patient advised to increase oral intake.  Hypertension -Blood pressure on the lower side.  Continue metoprolol if blood pressure allows.  Hypokalemia -Repleted -Continue to follow electrolytes trend/stability.  Hypomagnesemia -Replace.  Repeat a.m. labs  Hyperlipidemia -Continue statin  Depression -Continue bupropion -No suicidal ideation or hallucination.  Possible PE -Patient was empirically started on Eliquis during his last most recent hospitalization from 01/26/2022-01/30/2022 as CT angiogram could not rule out pulmonary embolism -Continue treat pain with Eliquis  Physical deconditioning -PT recommends home health PT -Expressing feeling weak and tired.  DVT prophylaxis: Eliquis Code Status: Full Family Communication: Wife at bedside Disposition Plan: Status is: Inpatient Remains inpatient appropriate because: Of severity of illness   Consultants: Oncology/palliative care/cardiology Procedures: None  Antimicrobials:  Anti-infectives (From admission, onward)    Start     Dose/Rate Route Frequency Ordered Stop   03/04/22 1400  ceFEPIme (MAXIPIME) 2 g in sodium chloride 0.9 % 100 mL IVPB        2 g 200 mL/hr over 30 Minutes Intravenous Every 8 hours 03/04/22 0807     03/03/22 2300  vancomycin (VANCOREADY) IVPB 1250 mg/250 mL  Status:  Discontinued        1,250 mg 166.7 mL/hr over 90 Minutes Intravenous Every 24 hours 03/02/22 2148 03/04/22 0755   03/03/22 1000  valACYclovir (VALTREX) tablet 500 mg        500 mg Oral Daily 03/02/22 2220     03/03/22 0600  ceFEPIme (  MAXIPIME) 2 g in  sodium chloride 0.9 % 100 mL IVPB  Status:  Discontinued        2 g 200 mL/hr over 30 Minutes Intravenous Every 12 hours 03/02/22 2148 03/02/22 2149   03/03/22 0600  ceFEPIme (MAXIPIME) 2 g in sodium chloride 0.9 % 100 mL IVPB  Status:  Discontinued        2 g 200 mL/hr over 30 Minutes Intravenous Every 12 hours 03/02/22 2149 03/04/22 0807   03/02/22 2200  vancomycin (VANCOREADY) IVPB 1500 mg/300 mL        1,500 mg 150 mL/hr over 120 Minutes Intravenous  Once 03/02/22 2135 03/03/22 0043   03/02/22 1815  ceFEPIme (MAXIPIME) 2 g in sodium chloride 0.9 % 100 mL IVPB        2 g 200 mL/hr over 30 Minutes Intravenous  Once 03/02/22 1800 03/02/22 1908   03/02/22 1815  metroNIDAZOLE (FLAGYL) IVPB 500 mg        500 mg 100 mL/hr over 60 Minutes Intravenous  Once 03/02/22 1800 03/02/22 2016        Subjective: Currently afebrile; no chest pain, good saturation on room air.  Patient reports complaints of bladder spasms and dysuria.  Also expressing poor appetite.   Objective: Vitals:   03/05/22 2051 03/06/22 0456 03/06/22 0602 03/06/22 1253  BP: 113/72 102/70  100/63  Pulse: 93 90  95  Resp: 19 19  17   Temp: 98.1 F (36.7 C) 97.6 F (36.4 C)  97.9 F (36.6 C)  TempSrc: Oral Oral    SpO2: 99% 98%  100%  Weight:   73.5 kg   Height:        Intake/Output Summary (Last 24 hours) at 03/06/2022 1736 Last data filed at 03/06/2022 0603 Gross per 24 hour  Intake 4174.36 ml  Output 1000 ml  Net 3174.36 ml   Filed Weights   03/04/22 0500 03/05/22 0654 03/06/22 0602  Weight: 64 kg 69.9 kg 73.5 kg    Examination: General exam: Alert, awake, oriented x 3; afebrile; reporting poor appetite And complains of bladder spasm and and discomfort with Foley catheter. Respiratory system: Good patient reports Cardiovascular system:Rate controlled. No rub or gallop; no JVD. Gastrointestinal system: Abdomen is nondistended, soft and nontender. No organomegaly or masses felt. Normal bowel sounds heard.   Foley catheter in place; no hematuria appreciated. Central nervous system: Alert and oriented. No focal neurological deficits. Extremities: No cyanosis, clubbing or edema. Skin: No petechiae. Psychiatry: Judgement and insight appear normal. Mood & affect appropriate.   Data Reviewed: I have personally reviewed following labs and imaging studies  CBC: Recent Labs  Lab 03/02/22 1356 03/03/22 0612 03/04/22 0607 03/05/22 0631 03/06/22 0505  WBC 26.5* 6.2 5.9 8.5 9.1  NEUTROABS 0.0*  --   --  0.0* 0.0*  HGB 10.9* 8.5* 8.0* 7.8* 7.6*  HCT 34.0* 26.0* 25.0* 25.1* 24.1*  MCV 94.2 94.2 95.1 94.7 94.5  PLT 212 111* 106* 142* 202   Basic Metabolic Panel: Recent Labs  Lab 03/02/22 1356 03/03/22 0612 03/04/22 0607 03/05/22 0631 03/06/22 0505  NA 135 134* 135 136 134*  K 3.7 3.7 2.8* 3.1* 2.9*  CL 103 106 108 111 109  CO2 19* 21* 22 19* 20*  GLUCOSE 118* 150* 108* 83 81  BUN 30* 28* 20 13 9   CREATININE 1.06 1.00 0.70 0.49* 0.53*  CALCIUM 9.2 8.2* 8.1* 8.1* 7.7*  MG 1.6*  --  1.7 1.3* 1.5*   GFR: Estimated Creatinine Clearance: 89.3  mL/min (A) (by C-G formula based on SCr of 0.53 mg/dL (L)).  Liver Function Tests: Recent Labs  Lab 03/02/22 1356 03/04/22 0607 03/06/22 0505  AST 13* 16 16  ALT 13 17 18   ALKPHOS 72 50 44  BILITOT 1.2 0.5 0.6  PROT 6.3* 4.4* 4.1*  ALBUMIN 3.2* 2.1* 1.9*   Recent Labs  Lab 03/02/22 1356 03/03/22 0612  LIPASE 72* 25   Recent Labs  Lab 03/02/22 1400  AMMONIA <10   Sepsis Labs: Recent Labs  Lab 03/02/22 1356 03/02/22 1716 03/02/22 1832 03/02/22 2054  LATICACIDVEN 1.7 3.0* 1.5 0.9    Recent Results (from the past 240 hour(s))  Blood culture (routine x 2)     Status: Abnormal   Collection Time: 03/02/22  2:01 PM   Specimen: Right Antecubital; Blood  Result Value Ref Range Status   Specimen Description   Final    RIGHT ANTECUBITAL BOTTLES DRAWN AEROBIC AND ANAEROBIC Performed at [facility], [address] [place] [address], [place],  [place] [address]    Special Requests   Final    Blood Culture results may not be optimal due to an excessive volume of blood received in culture bottles Performed at [facility], [address], [place], [place] [address]    Culture  Setup Time   Final    GRAM NEGATIVE RODS AEROBIC BOTTLE ONLY Gram Stain Report Called to,Read Back By and Verified With:  T. ISLEY @ 1217 BY STEPHTR 03/03/22 Performed at [facility], [address] [place],  [place] [address]    Culture (A)  Final    PSEUDOMONAS AERUGINOSA SUSCEPTIBILITIES PERFORMED ON PREVIOUS CULTURE WITHIN THE LAST 5 DAYS. Performed at [facility], [place] [address] [place], [place] [address]    Report Status 03/05/2022 FINAL  Final  Blood culture (routine x 2)     Status: Abnormal   Collection Time: 03/02/22  2:06 PM   Specimen: Left Antecubital; Blood  Result Value Ref Range Status   Specimen Description   Final    LEFT ANTECUBITAL BOTTLES DRAWN AEROBIC AND ANAEROBIC Performed at [facility], [address], [place], [place] [address]    Special Requests   Final    Blood Culture results may not be optimal due to an excessive volume of blood received in culture bottles Performed at [facility], [address] [place], [place] [address]    Culture  Setup Time   Final    GRAM NEGATIVE RODS Gram Stain Report Called to,Read Back By and Verified With:  T.ISLEY @ 8786 BY STEPHTR 03/03/22 AEROBIC BOTTLE ONLY Organism ID to follow CRITICAL RESULT CALLED TO, READ BACK BY AND VERIFIED WITH:  Alberteen Sam, RN 03/03/22 1619 A. LAFRANCE Performed at [facility], [place] [address], [place], [place] [address]    Culture PSEUDOMONAS AERUGINOSA (A)  Final   Report Status 03/05/2022 FINAL  Final   Organism ID, Bacteria PSEUDOMONAS AERUGINOSA  Final      Susceptibility   Pseudomonas aeruginosa - MIC*    CEFTAZIDIME 2 SENSITIVE Sensitive     CIPROFLOXACIN <=0.25 SENSITIVE Sensitive     GENTAMICIN <=1 SENSITIVE Sensitive     IMIPENEM 2  SENSITIVE Sensitive     PIP/TAZO <=4 SENSITIVE Sensitive     CEFEPIME 2 SENSITIVE Sensitive     * PSEUDOMONAS AERUGINOSA  Blood Culture ID Panel (Reflexed)     Status: Abnormal   Collection Time: 03/02/22  2:06 PM  Result Value Ref Range Status   Enterococcus  faecalis NOT DETECTED NOT DETECTED Final   Enterococcus Faecium NOT DETECTED NOT DETECTED Final   Listeria monocytogenes NOT DETECTED NOT DETECTED Final   Staphylococcus species NOT DETECTED NOT DETECTED Final   Staphylococcus aureus (BCID) NOT DETECTED NOT DETECTED Final   Staphylococcus epidermidis NOT DETECTED NOT DETECTED Final   Staphylococcus lugdunensis NOT DETECTED NOT DETECTED Final   Streptococcus species NOT DETECTED NOT DETECTED Final   Streptococcus agalactiae NOT DETECTED NOT DETECTED Final   Streptococcus pneumoniae NOT DETECTED NOT DETECTED Final   Streptococcus pyogenes NOT DETECTED NOT DETECTED Final   A.calcoaceticus-baumannii NOT DETECTED NOT DETECTED Final   Bacteroides fragilis NOT DETECTED NOT DETECTED Final   Enterobacterales NOT DETECTED NOT DETECTED Final   Enterobacter cloacae complex NOT DETECTED NOT DETECTED Final   Escherichia coli NOT DETECTED NOT DETECTED Final   Klebsiella aerogenes NOT DETECTED NOT DETECTED Final   Klebsiella oxytoca NOT DETECTED NOT DETECTED Final   Klebsiella pneumoniae NOT DETECTED NOT DETECTED Final   Proteus species NOT DETECTED NOT DETECTED Final   Salmonella species NOT DETECTED NOT DETECTED Final   Serratia marcescens NOT DETECTED NOT DETECTED Final   Haemophilus influenzae NOT DETECTED NOT DETECTED Final   Neisseria meningitidis NOT DETECTED NOT DETECTED Final   Pseudomonas aeruginosa DETECTED (A) NOT DETECTED Final    Comment: CRITICAL RESULT CALLED TO, READ BACK BY AND VERIFIED WITH:  Alberteen Sam, RN 03/03/22 1619 A. LAFRANCE     Stenotrophomonas maltophilia NOT DETECTED NOT DETECTED Final   Candida albicans NOT DETECTED NOT DETECTED Final   Candida auris NOT  DETECTED NOT DETECTED Final   Candida glabrata NOT DETECTED NOT DETECTED Final   Candida krusei NOT DETECTED NOT DETECTED Final   Candida parapsilosis NOT DETECTED NOT DETECTED Final   Candida tropicalis NOT DETECTED NOT DETECTED Final   Cryptococcus neoformans/gattii NOT DETECTED NOT DETECTED Final   CTX-M ESBL NOT DETECTED NOT DETECTED Final   Carbapenem resistance IMP NOT DETECTED NOT DETECTED Final   Carbapenem resistance KPC NOT DETECTED NOT DETECTED Final   Carbapenem resistance NDM NOT DETECTED NOT DETECTED Final   Carbapenem resistance VIM NOT DETECTED NOT DETECTED Final    Comment: Performed at [facility], [address] [place], [place] [address]  Resp Panel by RT-PCR (Flu A&B, Covid) Anterior Nasal Swab     Status: None   Collection Time: 03/02/22  3:08 PM   Specimen: Anterior Nasal Swab  Result Value Ref Range Status   SARS Coronavirus 2 by RT PCR NEGATIVE NEGATIVE Final    Comment: (NOTE) SARS-CoV-2 target nucleic acids are NOT DETECTED.  The SARS-CoV-2 RNA is generally detectable in upper respiratory specimens during the acute phase of infection. The lowest concentration of SARS-CoV-2 viral copies this assay can detect is 138 copies/mL. A negative result does not preclude SARS-Cov-2 infection and should not be used as the sole basis for treatment or other patient management decisions. A negative result may occur with  improper specimen collection/handling, submission of specimen other than nasopharyngeal swab, presence of viral mutation(s) within the areas targeted by this assay, and inadequate number of viral copies(<138 copies/mL). A negative result must be combined with clinical observations, patient history, and epidemiological information. The expected result is Negative.  Fact Sheet for Patients:  EntrepreneurPulse.com.au  Fact Sheet for Healthcare Providers:  IncredibleEmployment.be  This test is no t yet approved  or cleared by the [place] FDA and  has been authorized for detection and/or diagnosis of SARS-CoV-2 by FDA under  an Emergency Use Authorization (EUA). This EUA will remain  in effect (meaning this test can be used) for the duration of the COVID-19 declaration under Section 564(b)(1) of the Act, 21 U.S.C.section 360bbb-3(b)(1), unless the authorization is terminated  or revoked sooner.       Influenza A by PCR NEGATIVE NEGATIVE Final   Influenza B by PCR NEGATIVE NEGATIVE Final    Comment: (NOTE) The Xpert Xpress SARS-CoV-2/FLU/RSV plus assay is intended as an aid in the diagnosis of influenza from Nasopharyngeal swab specimens and should not be used as a sole basis for treatment. Nasal washings and aspirates are unacceptable for Xpert Xpress SARS-CoV-2/FLU/RSV testing.  Fact Sheet for Patients: EntrepreneurPulse.com.au  Fact Sheet for Healthcare Providers: IncredibleEmployment.be  This test is not yet approved or cleared by the [place] FDA and has been authorized for detection and/or diagnosis of SARS-CoV-2 by FDA under an Emergency Use Authorization (EUA). This EUA will remain in effect (meaning this test can be used) for the duration of the COVID-19 declaration under Section 564(b)(1) of the Act, 21 U.S.C. section 360bbb-3(b)(1), unless the authorization is terminated or revoked.  Performed at [facility], [address] [place], [place] [address]   Urine Culture     Status: None   Collection Time: 03/02/22  7:03 PM   Specimen: Urine, Clean Catch  Result Value Ref Range Status   Specimen Description   Final    URINE, CLEAN CATCH Performed at [facility], [address], [place], [place] [address]    Special Requests   Final    NONE Performed at [facility], [address], [place], [place] [address]    Culture   Final    NO GROWTH Performed at [facility], [place] [address], [place], [place] [address]    Report  Status 03/04/2022 FINAL  Final  Culture, blood (Routine X 2) w Reflex to ID Panel     Status: None (Preliminary result)   Collection Time: 03/06/22  1:48 PM   Specimen: BLOOD RIGHT HAND  Result Value Ref Range Status   Specimen Description BLOOD RIGHT HAND  Final   Special Requests   Final    BOTTLES DRAWN AEROBIC AND ANAEROBIC Blood Culture adequate volume Performed at [facility], [address] [place], [place] [address]    Culture PENDING  Incomplete   Report Status PENDING  Incomplete     Radiology Studies: No results found.  Scheduled Meds:  apixaban  5 mg Oral BID   buPROPion  150 mg Oral Daily   Chlorhexidine Gluconate Cloth  6 each Topical Daily   megestrol  400 mg Oral BID   metoprolol tartrate  37.5 mg Oral BID   midodrine  5 mg Oral TID WC   nystatin  5 mL Oral QID   oxybutynin  2.5 mg Oral BID   oxyCODONE  20 mg Oral Q12H   pravastatin  20 mg Oral q1800   senna-docusate  2 tablet Oral BID   Tbo-filgastrim (GRANIX) SQ  480 mcg Subcutaneous q1800   valACYclovir  500 mg Oral Daily   Continuous Infusions:  sodium chloride 100 mL/hr at 03/06/22 0915   ceFEPime (MAXIPIME) IV 2 g (03/06/22 1419)    Barton Dubois, MD [facility] 03/06/2022, 5:36 PM

## 2022-03-07 DIAGNOSIS — I350 Nonrheumatic aortic (valve) stenosis: Secondary | ICD-10-CM | POA: Diagnosis not present

## 2022-03-07 DIAGNOSIS — I480 Paroxysmal atrial fibrillation: Secondary | ICD-10-CM | POA: Diagnosis not present

## 2022-03-07 DIAGNOSIS — I351 Nonrheumatic aortic (valve) insufficiency: Secondary | ICD-10-CM

## 2022-03-07 DIAGNOSIS — R000 Tachycardia, unspecified: Secondary | ICD-10-CM | POA: Diagnosis not present

## 2022-03-07 DIAGNOSIS — I2699 Other pulmonary embolism without acute cor pulmonale: Secondary | ICD-10-CM

## 2022-03-07 LAB — MAGNESIUM: Magnesium: 1.4 mg/dL — ABNORMAL LOW (ref 1.7–2.4)

## 2022-03-07 MED ORDER — METOPROLOL TARTRATE 50 MG PO TABS
50.0000 mg | ORAL_TABLET | Freq: Two times a day (BID) | ORAL | Status: DC
  Administered 2022-03-07 – 2022-03-15 (×16): 50 mg via ORAL
  Filled 2022-03-07 (×16): qty 1

## 2022-03-07 MED ORDER — METOPROLOL TARTRATE 25 MG PO TABS: 12.5000 mg | ORAL_TABLET | Freq: Once | ORAL | Status: AC

## 2022-03-07 MED ORDER — POTASSIUM CHLORIDE CRYS ER 20 MEQ PO TBCR
40.0000 meq | EXTENDED_RELEASE_TABLET | Freq: Once | ORAL | Status: AC
  Administered 2022-03-07: 40 meq via ORAL
  Filled 2022-03-07: qty 2

## 2022-03-07 NOTE — Progress Notes (Signed)
Date and time results received: 03/07/22 1348  Test: [facility] Critical Value: 0.1  Name of Provider Notified: MD Dyann Kief

## 2022-03-07 NOTE — Progress Notes (Addendum)
Patient slept on a off this shift. Ambulated with assistance from wife to bedside commode. Continued to monitor this shift. Patients weight 67.405kg actual standing weight on scale, this was done twice due to difference between bed weight and standing.

## 2022-03-07 NOTE — Progress Notes (Signed)
   Tele reviewed, SR with PACs and PVCs. Short runs SVT at times. No recurrent afib noted. He is on lopressor 37.5mg  bid, midodrine to help keep bp's up to tolerate metoprolol. Increase lopressor to 50mg  bid due to ongoing ectopy, we will continue to follow telemetry and patient peripherally tomorrow.      Merrily Pew, MD  03/07/2022, 9:07 AM

## 2022-03-07 NOTE — Progress Notes (Signed)
PROGRESS NOTE    [facility]  ONG:[id number] DOB: 10/22/1950 DOA: 03/02/2022 PCP: Redmond School, MD   Brief Narrative:  71 y.o. male with medical history significant of CLL, stage IV lung cancer with bony metastases currently on chemotherapy and has received radiation, hypertension, hyperlipidemia, COPD presented with rectal pain and hematuria with increasing confusion.  On presentation, he was found to be neutropenic.  CT of abdomen and pelvis with contrast showed acute pancreatitis but no abscess or pseudocyst along with right iliac bone infiltrative lytic masses consistent with metastases.  CT of the head without contrast was negative for any acute intracranial abnormality.  He was started on IV fluids, antibiotics and Granix.  Assessment & Plan:   Pseudomonas bacteremia -Sensitivities demonstrating pansensitive microorganism Vancomycin has been discontinued. -Will repeat blood cultures today.  If persistently bacteremic, might have to consider removing of port. -She has remained afebrile.  Neutropenic fever -Presented with neutrophils of 0.  Currently on Granix as per Dr. Delton Coombes who recommended to continue Granix till [facility] is more than 1000.  Antibiotic plan as above.  Imaging study as above -ANC 0.1 currently.  Stage IV lung cancer with bony metastasis/CLL -With cancer associated chronic pain -Follows up with Dr. Delton Coombes as an outpatient.  Has received radiation treatment recently.  Also undergoing chemotherapy.  Follow further recommendations from oncology. -Palliative care consultation appreciated; continue full code and full scope of practice. -Currently on scheduled OxyContin along with as needed oxycodone.  Oral thrush -Complains of sore throat and has evidence of oral thrush.  -Continue treatment with denies statin  Paroxysmal A-fib with RVR -Patient had episodes of A-fib with RVR on 03/03/2022 requiring oral and IV metoprolol.   -Rate currently controlled.   -Appreciate cardiology following and recommendations.   -Continue Eliquis for secondary prevention..  Elevated lipase -Imaging studies showed acute pancreatitis but clinically patient does not have any symptoms or signs of pancreatitis with benign abdominal appreciated on examination -Most likely acute phase reactant.  Failure to thrive/Severe malnutrition -Nutrition following.   -Continue Megace. -Patient advised to increase oral intake. -Patient's family has been instructed to bring food from outside if needed to increase oral intake and options.  Hypertension -Blood pressure on the lower side.  Continue metoprolol if blood pressure allows.  Hypokalemia -Repleted -Continue to follow electrolytes trend/stability.  Hypomagnesemia -Replace.  Repeat a.m. labs  Hyperlipidemia -Continue statin -Continue to follow LFTs and lipid profile as an outpatient.  Depression -Continue bupropion -No suicidal ideation or hallucination.  Possible PE -Patient was empirically started on Eliquis during his last most recent hospitalization from 01/26/2022-01/30/2022 as CT angiogram could not rule out pulmonary embolism -Continue treatment with Eliquis -Good saturation on room air.  Physical deconditioning -PT recommends home health PT -Expressing feeling weak and tired. -Home health orders will be placed at time of discharge.  DVT prophylaxis: Eliquis Code Status: Full Family Communication: Wife at bedside Disposition Plan: Status is: Inpatient Remains inpatient appropriate because: Of severity of illness   Consultants: Oncology/palliative care/cardiology Procedures: None  Antimicrobials:  Anti-infectives (From admission, onward)    Start     Dose/Rate Route Frequency Ordered Stop   03/04/22 1400  ceFEPIme (MAXIPIME) 2 g in sodium chloride 0.9 % 100 mL IVPB        2 g 200 mL/hr over 30 Minutes Intravenous Every 8 hours 03/04/22 0807     03/03/22 2300  vancomycin (VANCOREADY)  IVPB 1250 mg/250 mL  Status:  Discontinued  1,250 mg 166.7 mL/hr over 90 Minutes Intravenous Every 24 hours 03/02/22 2148 03/04/22 0755   03/03/22 1000  valACYclovir (VALTREX) tablet 500 mg        500 mg Oral Daily 03/02/22 2220     03/03/22 0600  ceFEPIme (MAXIPIME) 2 g in sodium chloride 0.9 % 100 mL IVPB  Status:  Discontinued        2 g 200 mL/hr over 30 Minutes Intravenous Every 12 hours 03/02/22 2148 03/02/22 2149   03/03/22 0600  ceFEPIme (MAXIPIME) 2 g in sodium chloride 0.9 % 100 mL IVPB  Status:  Discontinued        2 g 200 mL/hr over 30 Minutes Intravenous Every 12 hours 03/02/22 2149 03/04/22 0807   03/02/22 2200  vancomycin (VANCOREADY) IVPB 1500 mg/300 mL        1,500 mg 150 mL/hr over 120 Minutes Intravenous  Once 03/02/22 2135 03/03/22 0043   03/02/22 1815  ceFEPIme (MAXIPIME) 2 g in sodium chloride 0.9 % 100 mL IVPB        2 g 200 mL/hr over 30 Minutes Intravenous  Once 03/02/22 1800 03/02/22 1908   03/02/22 1815  metroNIDAZOLE (FLAGYL) IVPB 500 mg        500 mg 100 mL/hr over 60 Minutes Intravenous  Once 03/02/22 1800 03/02/22 2016        Subjective: Patient has remained afebrile; no chest pain, good saturation on room air.  Reports no bladder spasms and is able to pee on his own.   Objective: Vitals:   03/06/22 1253 03/06/22 2114 03/07/22 0552 03/07/22 1251  BP: 100/63 102/71 113/68 106/68  Pulse: 95 (!) 105 95 87  Resp: 17 20 16 17   Temp: 97.9 F (36.6 C) 98.1 F (36.7 C) 98.1 F (36.7 C) 98.1 F (36.7 C)  TempSrc:  Oral Oral Oral  SpO2: 100% 100% 99% 99%  Weight:   67.4 kg   Height:        Intake/Output Summary (Last 24 hours) at 03/07/2022 1741 Last data filed at 03/07/2022 1300 Gross per 24 hour  Intake 720 ml  Output 1475 ml  Net -755 ml   Filed Weights   03/05/22 0654 03/06/22 0602 03/07/22 0552  Weight: 69.9 kg 73.5 kg 67.4 kg    Examination: General exam: Alert, awake, oriented x 3; has remained afebrile.  Continues to have poor  appetite and poor oral intake.  Significant improvement in his bladder spasm after Foley catheter removed, patient able to urinate on his own. Respiratory system: Good saturation on room air; no using accessory muscles. Cardiovascular system: Rate controlled for the most part; no rubs, no gallops, no JVD on exam. Gastrointestinal system: Abdomen is nondistended, soft and nontender. No organomegaly or masses felt. Normal bowel sounds heard. Central nervous system: Alert and oriented. No focal neurological deficits. Extremities: No cyanosis or clubbing. Skin: No petechiae. Psychiatry: Judgement and insight appear normal. Mood & affect appropriate.   Data Reviewed: I have personally reviewed following labs and imaging studies  CBC: Recent Labs  Lab 03/02/22 1356 03/03/22 0612 03/04/22 0607 03/05/22 0631 03/06/22 0505 03/07/22 1202  WBC 26.5* 6.2 5.9 8.5 9.1 28.8*  NEUTROABS 0.0*  --   --  0.0* 0.0* 0.1*  HGB 10.9* 8.5* 8.0* 7.8* 7.6* 9.5*  HCT 34.0* 26.0* 25.0* 25.1* 24.1* 30.3*  MCV 94.2 94.2 95.1 94.7 94.5 94.4  PLT 212 111* 106* 142* 165 867   Basic Metabolic Panel: Recent Labs  Lab 03/02/22 1356 03/03/22 0612  03/04/22 0607 03/05/22 0631 03/06/22 0505 03/07/22 1202  NA 135 134* 135 136 134* 133*  K 3.7 3.7 2.8* 3.1* 2.9* 3.6  CL 103 106 108 111 109 107  CO2 19* 21* 22 19* 20* 20*  GLUCOSE 118* 150* 108* 83 81 94  BUN 30* 28* 20 13 9 8   CREATININE 1.06 1.00 0.70 0.49* 0.53* 0.63  CALCIUM 9.2 8.2* 8.1* 8.1* 7.7* 8.4*  MG 1.6*  --  1.7 1.3* 1.5* 1.4*   GFR: Estimated Creatinine Clearance: 81.9 mL/min (by C-G formula based on SCr of 0.63 mg/dL).  Liver Function Tests: Recent Labs  Lab 03/02/22 1356 03/04/22 0607 03/06/22 0505  AST 13* 16 16  ALT 13 17 18   ALKPHOS 72 50 44  BILITOT 1.2 0.5 0.6  PROT 6.3* 4.4* 4.1*  ALBUMIN 3.2* 2.1* 1.9*   Recent Labs  Lab 03/02/22 1356 03/03/22 0612  LIPASE 72* 25   Recent Labs  Lab 03/02/22 1400  AMMONIA <10    Sepsis Labs: Recent Labs  Lab 03/02/22 1356 03/02/22 1716 03/02/22 1832 03/02/22 2054  LATICACIDVEN 1.7 3.0* 1.5 0.9    Recent Results (from the past 240 hour(s))  Blood culture (routine x 2)     Status: Abnormal   Collection Time: 03/02/22  2:01 PM   Specimen: Right Antecubital; Blood  Result Value Ref Range Status   Specimen Description   Final    RIGHT ANTECUBITAL BOTTLES DRAWN AEROBIC AND ANAEROBIC Performed at [facility], [address] [place], [place] [address]    Special Requests   Final    Blood Culture results may not be optimal due to an excessive volume of blood received in culture bottles Performed at [facility], [address], [place], [place] [address]    Culture  Setup Time   Final    GRAM NEGATIVE RODS AEROBIC BOTTLE ONLY Gram Stain Report Called to,Read Back By and Verified With:  T. ISLEY @ 1217 BY STEPHTR 03/03/22 Performed at [facility], [address], [place], [place] [address]    Culture (A)  Final    PSEUDOMONAS AERUGINOSA SUSCEPTIBILITIES PERFORMED ON PREVIOUS CULTURE WITHIN THE LAST 5 DAYS. Performed at [facility], [place] [address], [place], [place] [address]    Report Status 03/05/2022 FINAL  Final  Blood culture (routine x 2)     Status: Abnormal   Collection Time: 03/02/22  2:06 PM   Specimen: Left Antecubital; Blood  Result Value Ref Range Status   Specimen Description   Final    LEFT ANTECUBITAL BOTTLES DRAWN AEROBIC AND ANAEROBIC Performed at [facility], [address], [place], [place] [address]    Special Requests   Final    Blood Culture results may not be optimal due to an excessive volume of blood received in culture bottles Performed at [facility], [address] [place], [place] [address]    Culture  Setup Time   Final    GRAM NEGATIVE RODS Gram Stain Report Called to,Read Back By and Verified With:  T.ISLEY @ 0630 BY STEPHTR 03/03/22 AEROBIC BOTTLE ONLY Organism ID to follow CRITICAL RESULT CALLED TO, READ  BACK BY AND VERIFIED WITH:  Alberteen Sam, RN 03/03/22 1619 A. LAFRANCE Performed at [facility], [place] [address] [place], [place] [address]    Culture PSEUDOMONAS AERUGINOSA (A)  Final   Report Status 03/05/2022 FINAL  Final   Organism ID, Bacteria PSEUDOMONAS AERUGINOSA  Final      Susceptibility  Pseudomonas aeruginosa - MIC*    CEFTAZIDIME 2 SENSITIVE Sensitive     CIPROFLOXACIN <=0.25 SENSITIVE Sensitive     GENTAMICIN <=1 SENSITIVE Sensitive     IMIPENEM 2 SENSITIVE Sensitive     PIP/TAZO <=4 SENSITIVE Sensitive     CEFEPIME 2 SENSITIVE Sensitive     * PSEUDOMONAS AERUGINOSA  Blood Culture ID Panel (Reflexed)     Status: Abnormal   Collection Time: 03/02/22  2:06 PM  Result Value Ref Range Status   Enterococcus faecalis NOT DETECTED NOT DETECTED Final   Enterococcus Faecium NOT DETECTED NOT DETECTED Final   Listeria monocytogenes NOT DETECTED NOT DETECTED Final   Staphylococcus species NOT DETECTED NOT DETECTED Final   Staphylococcus aureus (BCID) NOT DETECTED NOT DETECTED Final   Staphylococcus epidermidis NOT DETECTED NOT DETECTED Final   Staphylococcus lugdunensis NOT DETECTED NOT DETECTED Final   Streptococcus species NOT DETECTED NOT DETECTED Final   Streptococcus agalactiae NOT DETECTED NOT DETECTED Final   Streptococcus pneumoniae NOT DETECTED NOT DETECTED Final   Streptococcus pyogenes NOT DETECTED NOT DETECTED Final   A.calcoaceticus-baumannii NOT DETECTED NOT DETECTED Final   Bacteroides fragilis NOT DETECTED NOT DETECTED Final   Enterobacterales NOT DETECTED NOT DETECTED Final   Enterobacter cloacae complex NOT DETECTED NOT DETECTED Final   Escherichia coli NOT DETECTED NOT DETECTED Final   Klebsiella aerogenes NOT DETECTED NOT DETECTED Final   Klebsiella oxytoca NOT DETECTED NOT DETECTED Final   Klebsiella pneumoniae NOT DETECTED NOT DETECTED Final   Proteus species NOT DETECTED NOT DETECTED Final   Salmonella species NOT DETECTED NOT DETECTED Final    Serratia marcescens NOT DETECTED NOT DETECTED Final   Haemophilus influenzae NOT DETECTED NOT DETECTED Final   Neisseria meningitidis NOT DETECTED NOT DETECTED Final   Pseudomonas aeruginosa DETECTED (A) NOT DETECTED Final    Comment: CRITICAL RESULT CALLED TO, READ BACK BY AND VERIFIED WITH:  Alberteen Sam, RN 03/03/22 1619 A. LAFRANCE     Stenotrophomonas maltophilia NOT DETECTED NOT DETECTED Final   Candida albicans NOT DETECTED NOT DETECTED Final   Candida auris NOT DETECTED NOT DETECTED Final   Candida glabrata NOT DETECTED NOT DETECTED Final   Candida krusei NOT DETECTED NOT DETECTED Final   Candida parapsilosis NOT DETECTED NOT DETECTED Final   Candida tropicalis NOT DETECTED NOT DETECTED Final   Cryptococcus neoformans/gattii NOT DETECTED NOT DETECTED Final   CTX-M ESBL NOT DETECTED NOT DETECTED Final   Carbapenem resistance IMP NOT DETECTED NOT DETECTED Final   Carbapenem resistance KPC NOT DETECTED NOT DETECTED Final   Carbapenem resistance NDM NOT DETECTED NOT DETECTED Final   Carbapenem resistance VIM NOT DETECTED NOT DETECTED Final    Comment: Performed at [facility], [address] [place], [place] [address]  Resp Panel by RT-PCR (Flu A&B, Covid) Anterior Nasal Swab     Status: None   Collection Time: 03/02/22  3:08 PM   Specimen: Anterior Nasal Swab  Result Value Ref Range Status   SARS Coronavirus 2 by RT PCR NEGATIVE NEGATIVE Final    Comment: (NOTE) SARS-CoV-2 target nucleic acids are NOT DETECTED.  The SARS-CoV-2 RNA is generally detectable in upper respiratory specimens during the acute phase of infection. The lowest concentration of SARS-CoV-2 viral copies this assay can detect is 138 copies/mL. A negative result does not preclude SARS-Cov-2 infection and should not be used as the sole basis for treatment or other patient management decisions. A negative result may occur with  improper specimen collection/handling, submission of  specimen other than  nasopharyngeal swab, presence of viral mutation(s) within the areas targeted by this assay, and inadequate number of viral copies(<138 copies/mL). A negative result must be combined with clinical observations, patient history, and epidemiological information. The expected result is Negative.  Fact Sheet for Patients:  EntrepreneurPulse.com.au  Fact Sheet for Healthcare Providers:  IncredibleEmployment.be  This test is no t yet approved or cleared by the [place] FDA and  has been authorized for detection and/or diagnosis of SARS-CoV-2 by FDA under an Emergency Use Authorization (EUA). This EUA will remain  in effect (meaning this test can be used) for the duration of the COVID-19 declaration under Section 564(b)(1) of the Act, 21 U.S.C.section 360bbb-3(b)(1), unless the authorization is terminated  or revoked sooner.       Influenza A by PCR NEGATIVE NEGATIVE Final   Influenza B by PCR NEGATIVE NEGATIVE Final    Comment: (NOTE) The Xpert Xpress SARS-CoV-2/FLU/RSV plus assay is intended as an aid in the diagnosis of influenza from Nasopharyngeal swab specimens and should not be used as a sole basis for treatment. Nasal washings and aspirates are unacceptable for Xpert Xpress SARS-CoV-2/FLU/RSV testing.  Fact Sheet for Patients: EntrepreneurPulse.com.au  Fact Sheet for Healthcare Providers: IncredibleEmployment.be  This test is not yet approved or cleared by the [place] FDA and has been authorized for detection and/or diagnosis of SARS-CoV-2 by FDA under an Emergency Use Authorization (EUA). This EUA will remain in effect (meaning this test can be used) for the duration of the COVID-19 declaration under Section 564(b)(1) of the Act, 21 U.S.C. section 360bbb-3(b)(1), unless the authorization is terminated or revoked.  Performed at [facility], [address], [place], [place] [address]   Urine  Culture     Status: None   Collection Time: 03/02/22  7:03 PM   Specimen: Urine, Clean Catch  Result Value Ref Range Status   Specimen Description   Final    URINE, CLEAN CATCH Performed at [facility], [address] [place], [place] [address]    Special Requests   Final    NONE Performed at [facility], [address], [place], [place] [address]    Culture   Final    NO GROWTH Performed at [facility], [place] [address], [place], [place] [address]    Report Status 03/04/2022 FINAL  Final  Culture, blood (Routine X 2) w Reflex to ID Panel     Status: None (Preliminary result)   Collection Time: 03/06/22  1:48 PM   Specimen: BLOOD RIGHT HAND  Result Value Ref Range Status   Specimen Description BLOOD RIGHT HAND  Final   Special Requests   Final    BOTTLES DRAWN AEROBIC AND ANAEROBIC Blood Culture adequate volume   Culture   Final    NO GROWTH < 24 HOURS Performed at [facility] [address] [place], [place] [address]    Report Status PENDING  Incomplete     Radiology Studies: No results found.  Scheduled Meds:  apixaban  5 mg Oral BID   buPROPion  150 mg Oral Daily   Chlorhexidine Gluconate Cloth  6 each Topical Daily   megestrol  400 mg Oral BID   metoprolol tartrate  50 mg Oral BID   midodrine  5 mg Oral TID WC   nystatin  5 mL Oral QID   oxybutynin  2.5 mg Oral BID   oxyCODONE  20 mg Oral Q12H   pravastatin  20 mg Oral q1800  senna-docusate  2 tablet Oral BID   Tbo-filgastrim (GRANIX) SQ  480 mcg Subcutaneous q1800   valACYclovir  500 mg Oral Daily   Continuous Infusions:  sodium chloride 100 mL/hr at 03/06/22 2204   ceFEPime (MAXIPIME) IV 2 g (03/07/22 1427)    Barton Dubois, MD [facility] 03/07/2022, 5:41 PM

## 2022-03-08 DIAGNOSIS — I351 Nonrheumatic aortic (valve) insufficiency: Secondary | ICD-10-CM | POA: Diagnosis not present

## 2022-03-08 DIAGNOSIS — R000 Tachycardia, unspecified: Secondary | ICD-10-CM | POA: Diagnosis not present

## 2022-03-08 DIAGNOSIS — I350 Nonrheumatic aortic (valve) stenosis: Secondary | ICD-10-CM | POA: Diagnosis not present

## 2022-03-08 DIAGNOSIS — I480 Paroxysmal atrial fibrillation: Secondary | ICD-10-CM | POA: Diagnosis not present

## 2022-03-08 LAB — BASIC METABOLIC PANEL
Anion gap: 6 (ref 5–15)
Anion gap: 8 (ref 5–15)
BUN: 8 mg/dL (ref 8–23)
BUN: 7 mg/dL — ABNORMAL LOW (ref 8–23)
CO2: 20 mmol/L — ABNORMAL LOW (ref 22–32)
CO2: 22 mmol/L (ref 22–32)
Calcium: 8.4 mg/dL — ABNORMAL LOW (ref 8.9–10.3)
Calcium: 7.5 mg/dL — ABNORMAL LOW (ref 8.9–10.3)
Chloride: 107 mmol/L (ref 98–111)
Chloride: 103 mmol/L (ref 98–111)
Creatinine, Ser: 0.63 mg/dL (ref 0.61–1.24)
Creatinine, Ser: 0.53 mg/dL — ABNORMAL LOW (ref 0.61–1.24)
GFR, Estimated: >60 mL/min (ref >60)
GFR, Estimated: >60 mL/min (ref >60)
Glucose, Bld: 94 mg/dL (ref 70–99)
Glucose, Bld: 86 mg/dL (ref 70–99)
Potassium: 3.6 mmol/L (ref 3.5–5.1)
Potassium: 2.9 mmol/L — ABNORMAL LOW (ref 3.5–5.1)
Sodium: 133 mmol/L — ABNORMAL LOW (ref 135–145)
Sodium: 133 mmol/L — ABNORMAL LOW (ref 135–145)

## 2022-03-08 LAB — IMMUNOGLOBULINS A/E/G/M, SERUM
IgA: 14 mg/dL — ABNORMAL LOW (ref 61–437)
IgE (Immunoglobulin E), Serum: <2 IU/mL — ABNORMAL LOW (ref 6–495)
IgG (Immunoglobin G), Serum: 182 mg/dL — ABNORMAL LOW (ref 603–1613)
IgM (Immunoglobulin M), Srm: 9 mg/dL — ABNORMAL LOW (ref 20–172)

## 2022-03-08 MED ORDER — HYDROCORTISONE (PERIANAL) 2.5 % EX CREA
TOPICAL_CREAM | Freq: Two times a day (BID) | CUTANEOUS | Status: DC
  Filled 2022-03-08: qty 28.35

## 2022-03-08 MED ORDER — PHENAZOPYRIDINE HCL 100 MG PO TABS
100.0000 mg | ORAL_TABLET | Freq: Three times a day (TID) | ORAL | Status: AC
  Administered 2022-03-08 – 2022-03-10 (×6): 100 mg via ORAL
  Filled 2022-03-08 (×6): qty 1

## 2022-03-08 MED ORDER — POTASSIUM CHLORIDE CRYS ER 20 MEQ PO TBCR
40.0000 meq | EXTENDED_RELEASE_TABLET | ORAL | Status: AC
  Administered 2022-03-08 (×3): 40 meq via ORAL
  Filled 2022-03-08 (×3): qty 2

## 2022-03-08 MED ORDER — ORAL CARE MOUTH RINSE: 15.0000 mL | OROMUCOSAL | Status: DC | PRN

## 2022-03-08 MED ORDER — MAGNESIUM SULFATE 2 GM/50ML IV SOLN
2.0000 g | Freq: Once | INTRAVENOUS | Status: AC
  Administered 2022-03-08: 2 g via INTRAVENOUS
  Filled 2022-03-08: qty 50

## 2022-03-08 NOTE — Care Management Important Message (Signed)
Important Message  Patient Details  Name: Alfred Brown MRN: [id number] Date of Birth: Apr 05, 1950   Medicare Important Message Given:  Yes     Tommy Medal 03/08/2022, 1:11 PM

## 2022-03-08 NOTE — Consult Note (Signed)
   [facility] Inpatient Consult   03/08/2022  Ostrander 06-01-1950 [id number]  [facility] [ACO] Patient: Medicare [facility] Liaison remote coverage review for patient admitted to [facility]   Primary Care Provider:  Redmond School, MD   Patient screened for hospitalization with noted extreme high risk score for unplanned readmission risk to assess for potential [place] Management service needs for post hospital transition for care coordination.  Review of patient's electronic medical record for admissions and MD progress notes, palliative consult reviewed with notes patient/wife discussion of code status of patient with CLL stage IV lung CA with mets as currently full code desired.  Plan:  Continue to follow progress and disposition to assess for post hospital community care coordination/management needs.  Referral request for community care coordination: ongoing f/u  For questions contact:   Natividad Brood, RN BSN [place]  [phone] business mobile phone Toll free office [phone]  *[place]  [phone] Fax number: [phone] [email or web address]

## 2022-03-08 NOTE — Progress Notes (Signed)
Telemetry reviewed. Yesterday due to some ongoing PACs/PVCs, short runs SVT we had increased his lopressor to 50mg  bid. He is requiring midodrine to maintain bp while on av nodal agent. Tele shows some ongoign ectopy but nothing sustained. BP's are stable after recent increase in lopressor. No recurrent afib noted. Drive for arrhythmias should decrease as his neutopenic fever/infection resolves. Continue current meds, no additional recs at this time, we will sign off inpatient care. If needed can titrate lopressor or midodrine further as needed for tachyarrhythmias or low bp's respectively.    Carlyle Dolly MD

## 2022-03-08 NOTE — Progress Notes (Signed)
Pharmacy Antibiotic Note  Alfred Brown is a 71 y.o. male admitted on 03/02/2022 with neutropenic fever/pseudomonas bacteremia  Pharmacy has been consulted for cefepime dosing. Remains neutropenic(ANC 100). Pseudomonas bacteremia  Plan: Continue Cefepime 2 grams IV every 8 hours Monitor clinical progress, cultures/sensitivities, renal function, abx plan   Height: 6\' 1"  (185.4 cm) Weight: 66.5 kg (146 lb 9.7 oz) IBW/kg (Calculated) : 79.9  Temp (24hrs), Avg:98.2 F (36.8 C), Min:98.1 F (36.7 C), Max:98.4 F (36.9 C)  Recent Labs  Lab 03/02/22 1356 03/02/22 1716 03/02/22 1832 03/02/22 2054 03/03/22 0612 03/04/22 0607 03/05/22 0631 03/06/22 0505 03/07/22 1202 03/08/22 0503  WBC 26.5*  --   --   --  6.2 5.9 8.5 9.1 28.8*  --   CREATININE 1.06  --   --   --  1.00 0.70 0.49* 0.53* 0.63 0.53*  LATICACIDVEN 1.7 3.0* 1.5 0.9  --   --   --   --   --   --      Estimated Creatinine Clearance: 80.8 mL/min (A) (by C-G formula based on SCr of 0.53 mg/dL (L)).    No Known Allergies  Antimicrobials this admission: 12/2 Cefepime >>  12/2 Flagyl  12/2 vanc >>12/3    Microbiology results: 12/2 BCx: pseudomonas-  pan sensitive S- cipro 12/2 UCx: ng   [name], BS Pharm D, BCPS Clinical Pharmacist 03/08/2022 11:44 AM

## 2022-03-08 NOTE — Progress Notes (Signed)
Patient slept on and off through the night. Patient did request pain medication this morning for back pain.

## 2022-03-08 NOTE — Progress Notes (Signed)
PROGRESS NOTE    [facility]  FHL:[id number] DOB: December 28, 1950 DOA: 03/02/2022 PCP: Redmond School, MD   Brief Narrative:  71 y.o. male with medical history significant of CLL, stage IV lung cancer with bony metastases currently on chemotherapy and has received radiation, hypertension, hyperlipidemia, COPD presented with rectal pain and hematuria with increasing confusion.  On presentation, he was found to be neutropenic.  CT of abdomen and pelvis with contrast showed acute pancreatitis but no abscess or pseudocyst along with right iliac bone infiltrative lytic masses consistent with metastases.  CT of the head without contrast was negative for any acute intracranial abnormality.  He was started on IV fluids, antibiotics and Granix.  Assessment & Plan:   Pseudomonas bacteremia -Sensitivities demonstrating pansensitive microorganism Vancomycin has been discontinued. -Will repeat blood cultures today.  If persistently bacteremic, might have to consider removing of port. -She has remained afebrile.  Neutropenic fever -Presented with neutrophils of 0.  Currently on Granix as per Dr. Delton Coombes who recommended to continue Granix till [facility] is more than 1000.  Antibiotic plan as above.  Imaging study as above -ANC 0.1 currently. -Repeat CBC with differential in AM.  Stage IV lung cancer with bony metastasis/CLL -With cancer associated chronic pain -Follows up with Dr. Delton Coombes as an outpatient.  Has received radiation treatment recently.  Also undergoing chemotherapy.  Follow further recommendations from oncology. -Palliative care consultation appreciated; continue full code and full scope of practice. -Currently on scheduled OxyContin along with as needed oxycodone.  Oral thrush -Complains of sore throat and has evidence of oral thrush.  -Continue treatment with denies statin  Paroxysmal A-fib with RVR -Patient had episodes of A-fib with RVR on 03/03/2022 requiring oral and IV metoprolol.    -Rate currently controlled.  -Appreciate cardiology following and recommendations.   -Continue Eliquis for secondary prevention..  Elevated lipase -Imaging studies showed acute pancreatitis but clinically patient does not have any symptoms or signs of pancreatitis with benign abdominal appreciated on examination -Most likely acute phase reactant.  Failure to thrive/Severe malnutrition -Nutrition following.   -Continue Megace. -Patient advised to increase oral intake. -Patient's family has been instructed to bring food from outside if needed to increase oral intake and broader options.  Hypertension -Blood pressure on the lower side.  Continue metoprolol if blood pressure allows.  Hypokalemia -Continue repletion and follow electrolytes trend. -Potassium 2.6 today.  Hypomagnesemia -Replace.  Repeat a.m. labs  Hyperlipidemia -Continue statin -Continue to follow LFTs and lipid profile as an outpatient.  Depression -Continue bupropion -No suicidal ideation or hallucination.  Possible PE -Patient was empirically started on Eliquis during his last most recent hospitalization from 01/26/2022-01/30/2022 as CT angiogram could not rule out pulmonary embolism -Continue treatment with Eliquis -Good saturation on room air.  Physical deconditioning -PT recommends home health PT -Expressing feeling weak and tired. -Home health orders will be placed at time of discharge.  Dysuria/rectal pain -Some improvement reported after Foley catheter removed -Given the still having symptoms we will use empirical Anusol cream and PDN -Maintain adequate hydration and follow clinical response.  DVT prophylaxis: Eliquis Code Status: Full Family Communication: Wife at bedside Disposition Plan: Status is: Inpatient Remains inpatient appropriate because: Of severity of illness   Consultants: Oncology/palliative care/cardiology Procedures: None  Antimicrobials:  Anti-infectives (From  admission, onward)    Start     Dose/Rate Route Frequency Ordered Stop   03/04/22 1400  ceFEPIme (MAXIPIME) 2 g in sodium chloride 0.9 % 100 mL IVPB  2 g 200 mL/hr over 30 Minutes Intravenous Every 8 hours 03/04/22 0807     03/03/22 2300  vancomycin (VANCOREADY) IVPB 1250 mg/250 mL  Status:  Discontinued        1,250 mg 166.7 mL/hr over 90 Minutes Intravenous Every 24 hours 03/02/22 2148 03/04/22 0755   03/03/22 1000  valACYclovir (VALTREX) tablet 500 mg        500 mg Oral Daily 03/02/22 2220     03/03/22 0600  ceFEPIme (MAXIPIME) 2 g in sodium chloride 0.9 % 100 mL IVPB  Status:  Discontinued        2 g 200 mL/hr over 30 Minutes Intravenous Every 12 hours 03/02/22 2148 03/02/22 2149   03/03/22 0600  ceFEPIme (MAXIPIME) 2 g in sodium chloride 0.9 % 100 mL IVPB  Status:  Discontinued        2 g 200 mL/hr over 30 Minutes Intravenous Every 12 hours 03/02/22 2149 03/04/22 0807   03/02/22 2200  vancomycin (VANCOREADY) IVPB 1500 mg/300 mL        1,500 mg 150 mL/hr over 120 Minutes Intravenous  Once 03/02/22 2135 03/03/22 0043   03/02/22 1815  ceFEPIme (MAXIPIME) 2 g in sodium chloride 0.9 % 100 mL IVPB        2 g 200 mL/hr over 30 Minutes Intravenous  Once 03/02/22 1800 03/02/22 1908   03/02/22 1815  metroNIDAZOLE (FLAGYL) IVPB 500 mg        500 mg 100 mL/hr over 60 Minutes Intravenous  Once 03/02/22 1800 03/02/22 2016        Subjective: Afebrile, no chest pain, no nausea or vomiting.  Complaining of dysuria and rectal pain.   Objective: Vitals:   03/07/22 2118 03/07/22 2120 03/08/22 0510 03/08/22 1203  BP: 114/75 114/75 119/76 118/67  Pulse: 94 94 94 85  Resp:  18 18 16   Temp:  98.4 F (36.9 C) 98.2 F (36.8 C) 98.1 F (36.7 C)  TempSrc:  Oral  Oral  SpO2:  99% 99% 100%  Weight:   66.5 kg   Height:        Intake/Output Summary (Last 24 hours) at 03/08/2022 1602 Last data filed at 03/08/2022 0900 Gross per 24 hour  Intake 960 ml  Output 4075 ml  Net -3115 ml    Filed Weights   03/06/22 0602 03/07/22 0552 03/08/22 0510  Weight: 73.5 kg 67.4 kg 66.5 kg    Examination: General exam: Alert, awake, oriented x 3; chronically ill in appearance; in no acute distress.  Expressing intermittent dysuria and having some insomnia.  Currently afebrile. Respiratory system: Clear to auscultation. Respiratory effort normal.  Good saturation on regular Cardiovascular system: Rate controlled, no rubs, no gallops, no JVD on exam. Gastrointestinal system: Abdomen is nondistended, soft and nontender. No organomegaly or masses felt. Normal bowel sounds heard. Central nervous system: Alert and oriented. No focal neurological deficits.   Extremities: No cyanosis or clubbing. Skin: No petechiae. Psychiatry: Judgement and insight appear normal. Mood & affect appropriate.   Data Reviewed: I have personally reviewed following labs and imaging studies  CBC: Recent Labs  Lab 03/02/22 1356 03/03/22 0612 03/04/22 0607 03/05/22 0631 03/06/22 0505 03/07/22 1202  WBC 26.5* 6.2 5.9 8.5 9.1 28.8*  NEUTROABS 0.0*  --   --  0.0* 0.0* 0.1*  HGB 10.9* 8.5* 8.0* 7.8* 7.6* 9.5*  HCT 34.0* 26.0* 25.0* 25.1* 24.1* 30.3*  MCV 94.2 94.2 95.1 94.7 94.5 94.4  PLT 212 111* 106* 142* 165 293  Basic Metabolic Panel: Recent Labs  Lab 03/02/22 1356 03/03/22 0612 03/04/22 0607 03/05/22 0631 03/06/22 0505 03/07/22 1202 03/08/22 0503  NA 135   < > 135 136 134* 133* 133*  K 3.7   < > 2.8* 3.1* 2.9* 3.6 2.9*  CL 103   < > 108 111 109 107 103  CO2 19*   < > 22 19* 20* 20* 22  GLUCOSE 118*   < > 108* 83 81 94 86  BUN 30*   < > 20 13 9 8  7*  CREATININE 1.06   < > 0.70 0.49* 0.53* 0.63 0.53*  CALCIUM 9.2   < > 8.1* 8.1* 7.7* 8.4* 7.5*  MG 1.6*  --  1.7 1.3* 1.5* 1.4*  --    < > = values in this interval not displayed.   GFR: Estimated Creatinine Clearance: 80.8 mL/min (A) (by C-G formula based on SCr of 0.53 mg/dL (L)).  Liver Function Tests: Recent Labs  Lab 03/02/22 1356  03/04/22 0607 03/06/22 0505  AST 13* 16 16  ALT 13 17 18   ALKPHOS 72 50 44  BILITOT 1.2 0.5 0.6  PROT 6.3* 4.4* 4.1*  ALBUMIN 3.2* 2.1* 1.9*   Recent Labs  Lab 03/02/22 1356 03/03/22 0612  LIPASE 72* 25   Recent Labs  Lab 03/02/22 1400  AMMONIA <10   Sepsis Labs: Recent Labs  Lab 03/02/22 1356 03/02/22 1716 03/02/22 1832 03/02/22 2054  LATICACIDVEN 1.7 3.0* 1.5 0.9    Recent Results (from the past 240 hour(s))  Blood culture (routine x 2)     Status: Abnormal   Collection Time: 03/02/22  2:01 PM   Specimen: Right Antecubital; Blood  Result Value Ref Range Status   Specimen Description   Final    RIGHT ANTECUBITAL BOTTLES DRAWN AEROBIC AND ANAEROBIC Performed at [facility], [address] [place], [place] [address]    Special Requests   Final    Blood Culture results may not be optimal due to an excessive volume of blood received in culture bottles Performed at [facility], [address] [place], [place] [address]    Culture  Setup Time   Final    GRAM NEGATIVE RODS AEROBIC BOTTLE ONLY Gram Stain Report Called to,Read Back By and Verified With:  T. ISLEY @ 1217 BY STEPHTR 03/03/22 Performed at [facility], [address] [place], [place] [address]    Culture (A)  Final    PSEUDOMONAS AERUGINOSA SUSCEPTIBILITIES PERFORMED ON PREVIOUS CULTURE WITHIN THE LAST 5 DAYS. Performed at [facility], [place] [address] [place], [place] [address]    Report Status 03/05/2022 FINAL  Final  Blood culture (routine x 2)     Status: Abnormal   Collection Time: 03/02/22  2:06 PM   Specimen: Left Antecubital; Blood  Result Value Ref Range Status   Specimen Description   Final    LEFT ANTECUBITAL BOTTLES DRAWN AEROBIC AND ANAEROBIC Performed at [facility], [address] [place], [place] [address]    Special Requests   Final    Blood Culture results may not be optimal due to an excessive volume of blood received in culture bottles Performed at [facility], [address] [place], [place] [address]    Culture  Setup Time   Final    GRAM NEGATIVE RODS Gram Stain Report Called to,Read Back By and Verified With:  T.ISLEY @ 1217 BY STEPHTR 03/03/22 AEROBIC BOTTLE ONLY Organism ID to follow CRITICAL RESULT CALLED TO, READ  BACK BY AND VERIFIED WITH:  Alberteen Sam, RN 03/03/22 1619 A. LAFRANCE Performed at [facility], [place] [address] [place], [place] [address]    Culture PSEUDOMONAS AERUGINOSA (A)  Final   Report Status 03/05/2022 FINAL  Final   Organism ID, Bacteria PSEUDOMONAS AERUGINOSA  Final      Susceptibility   Pseudomonas aeruginosa - MIC*    CEFTAZIDIME 2 SENSITIVE Sensitive     CIPROFLOXACIN <=0.25 SENSITIVE Sensitive     GENTAMICIN <=1 SENSITIVE Sensitive     IMIPENEM 2 SENSITIVE Sensitive     PIP/TAZO <=4 SENSITIVE Sensitive     CEFEPIME 2 SENSITIVE Sensitive     * PSEUDOMONAS AERUGINOSA  Blood Culture ID Panel (Reflexed)     Status: Abnormal   Collection Time: 03/02/22  2:06 PM  Result Value Ref Range Status   Enterococcus faecalis NOT DETECTED NOT DETECTED Final   Enterococcus Faecium NOT DETECTED NOT DETECTED Final   Listeria monocytogenes NOT DETECTED NOT DETECTED Final   Staphylococcus species NOT DETECTED NOT DETECTED Final   Staphylococcus aureus (BCID) NOT DETECTED NOT DETECTED Final   Staphylococcus epidermidis NOT DETECTED NOT DETECTED Final   Staphylococcus lugdunensis NOT DETECTED NOT DETECTED Final   Streptococcus species NOT DETECTED NOT DETECTED Final   Streptococcus agalactiae NOT DETECTED NOT DETECTED Final   Streptococcus pneumoniae NOT DETECTED NOT DETECTED Final   Streptococcus pyogenes NOT DETECTED NOT DETECTED Final   A.calcoaceticus-baumannii NOT DETECTED NOT DETECTED Final   Bacteroides fragilis NOT DETECTED NOT DETECTED Final   Enterobacterales NOT DETECTED NOT DETECTED Final   Enterobacter cloacae complex NOT DETECTED NOT DETECTED Final   Escherichia coli NOT DETECTED NOT DETECTED Final    Klebsiella aerogenes NOT DETECTED NOT DETECTED Final   Klebsiella oxytoca NOT DETECTED NOT DETECTED Final   Klebsiella pneumoniae NOT DETECTED NOT DETECTED Final   Proteus species NOT DETECTED NOT DETECTED Final   Salmonella species NOT DETECTED NOT DETECTED Final   Serratia marcescens NOT DETECTED NOT DETECTED Final   Haemophilus influenzae NOT DETECTED NOT DETECTED Final   Neisseria meningitidis NOT DETECTED NOT DETECTED Final   Pseudomonas aeruginosa DETECTED (A) NOT DETECTED Final    Comment: CRITICAL RESULT CALLED TO, READ BACK BY AND VERIFIED WITH:  Alberteen Sam, RN 03/03/22 1619 A. LAFRANCE     Stenotrophomonas maltophilia NOT DETECTED NOT DETECTED Final   Candida albicans NOT DETECTED NOT DETECTED Final   Candida auris NOT DETECTED NOT DETECTED Final   Candida glabrata NOT DETECTED NOT DETECTED Final   Candida krusei NOT DETECTED NOT DETECTED Final   Candida parapsilosis NOT DETECTED NOT DETECTED Final   Candida tropicalis NOT DETECTED NOT DETECTED Final   Cryptococcus neoformans/gattii NOT DETECTED NOT DETECTED Final   CTX-M ESBL NOT DETECTED NOT DETECTED Final   Carbapenem resistance IMP NOT DETECTED NOT DETECTED Final   Carbapenem resistance KPC NOT DETECTED NOT DETECTED Final   Carbapenem resistance NDM NOT DETECTED NOT DETECTED Final   Carbapenem resistance VIM NOT DETECTED NOT DETECTED Final    Comment: Performed at [facility], [address] [place], [place] [address]  Resp Panel by RT-PCR (Flu A&B, Covid) Anterior Nasal Swab     Status: None   Collection Time: 03/02/22  3:08 PM   Specimen: Anterior Nasal Swab  Result Value Ref Range Status   SARS Coronavirus 2 by RT PCR NEGATIVE NEGATIVE Final    Comment: (NOTE) SARS-CoV-2 target nucleic acids are NOT DETECTED.  The SARS-CoV-2 RNA is generally  detectable in upper respiratory specimens during the acute phase of infection. The lowest concentration of SARS-CoV-2 viral copies this assay can detect is 138  copies/mL. A negative result does not preclude SARS-Cov-2 infection and should not be used as the sole basis for treatment or other patient management decisions. A negative result may occur with  improper specimen collection/handling, submission of specimen other than nasopharyngeal swab, presence of viral mutation(s) within the areas targeted by this assay, and inadequate number of viral copies(<138 copies/mL). A negative result must be combined with clinical observations, patient history, and epidemiological information. The expected result is Negative.  Fact Sheet for Patients:  EntrepreneurPulse.com.au  Fact Sheet for Healthcare Providers:  IncredibleEmployment.be  This test is no t yet approved or cleared by the [place] FDA and  has been authorized for detection and/or diagnosis of SARS-CoV-2 by FDA under an Emergency Use Authorization (EUA). This EUA will remain  in effect (meaning this test can be used) for the duration of the COVID-19 declaration under Section 564(b)(1) of the Act, 21 U.S.C.section 360bbb-3(b)(1), unless the authorization is terminated  or revoked sooner.       Influenza A by PCR NEGATIVE NEGATIVE Final   Influenza B by PCR NEGATIVE NEGATIVE Final    Comment: (NOTE) The Xpert Xpress SARS-CoV-2/FLU/RSV plus assay is intended as an aid in the diagnosis of influenza from Nasopharyngeal swab specimens and should not be used as a sole basis for treatment. Nasal washings and aspirates are unacceptable for Xpert Xpress SARS-CoV-2/FLU/RSV testing.  Fact Sheet for Patients: EntrepreneurPulse.com.au  Fact Sheet for Healthcare Providers: IncredibleEmployment.be  This test is not yet approved or cleared by the [place] FDA and has been authorized for detection and/or diagnosis of SARS-CoV-2 by FDA under an Emergency Use Authorization (EUA). This EUA will remain in effect (meaning  this test can be used) for the duration of the COVID-19 declaration under Section 564(b)(1) of the Act, 21 U.S.C. section 360bbb-3(b)(1), unless the authorization is terminated or revoked.  Performed at [facility], [address], [place], [place] [address]   Urine Culture     Status: None   Collection Time: 03/02/22  7:03 PM   Specimen: Urine, Clean Catch  Result Value Ref Range Status   Specimen Description   Final    URINE, CLEAN CATCH Performed at [facility], [address], [place], [place] [address]    Special Requests   Final    NONE Performed at [facility], [address] [place], [place] [address]    Culture   Final    NO GROWTH Performed at [facility], [place] [address] [place], [place] [address]    Report Status 03/04/2022 FINAL  Final  Culture, blood (Routine X 2) w Reflex to ID Panel     Status: None (Preliminary result)   Collection Time: 03/06/22  1:48 PM   Specimen: BLOOD RIGHT HAND  Result Value Ref Range Status   Specimen Description BLOOD RIGHT HAND  Final   Special Requests   Final    BOTTLES DRAWN AEROBIC AND ANAEROBIC Blood Culture adequate volume   Culture   Final    NO GROWTH < 24 HOURS Performed at [facility], [address], [place], [place] [address]    Report Status PENDING  Incomplete     Radiology Studies: No results found.  Scheduled Meds:  apixaban  5 mg Oral BID   buPROPion  150 mg Oral Daily   Chlorhexidine Gluconate Cloth  6 each  Topical Daily   megestrol  400 mg Oral BID   metoprolol tartrate  50 mg Oral BID   midodrine  5 mg Oral TID WC   nystatin  5 mL Oral QID   oxybutynin  2.5 mg Oral BID   oxyCODONE  20 mg Oral Q12H   phenazopyridine  100 mg Oral TID WC   potassium chloride  40 mEq Oral Q4H   pravastatin  20 mg Oral q1800   senna-docusate  2 tablet Oral BID   Tbo-filgastrim (GRANIX) SQ  480 mcg Subcutaneous q1800   valACYclovir  500 mg Oral Daily   Continuous Infusions:  sodium chloride 100 mL/hr at  03/08/22 0106   ceFEPime (MAXIPIME) IV 2 g (03/08/22 1258)    Barton Dubois, MD [facility] 03/08/2022, 4:02 PM

## 2022-03-09 DIAGNOSIS — R5081 Fever presenting with conditions classified elsewhere: Secondary | ICD-10-CM | POA: Diagnosis not present

## 2022-03-09 DIAGNOSIS — B965 Pseudomonas (aeruginosa) (mallei) (pseudomallei) as the cause of diseases classified elsewhere: Secondary | ICD-10-CM | POA: Diagnosis not present

## 2022-03-09 DIAGNOSIS — R7881 Bacteremia: Secondary | ICD-10-CM | POA: Diagnosis not present

## 2022-03-09 DIAGNOSIS — D709 Neutropenia, unspecified: Secondary | ICD-10-CM | POA: Diagnosis not present

## 2022-03-09 LAB — CBC WITH DIFFERENTIAL/PLATELET
Abs Immature Granulocytes: 0 10*3/uL (ref 0.00–0.07)
Basophils Absolute: 0 10*3/uL (ref 0.0–0.1)
Basophils Relative: 0 %
Eosinophils Absolute: 0 10*3/uL (ref 0.0–0.5)
Eosinophils Relative: 0 %
HCT: 26.8 % — ABNORMAL LOW (ref 39.0–52.0)
Hemoglobin: 8.5 g/dL — ABNORMAL LOW (ref 13.0–17.0)
Immature Granulocytes: 0 %
Lymphocytes Relative: 97 %
Lymphs Abs: 13.8 10*3/uL — ABNORMAL HIGH (ref 0.7–4.0)
MCH: 30 pg (ref 26.0–34.0)
MCHC: 31.7 g/dL (ref 30.0–36.0)
MCV: 94.7 fL (ref 80.0–100.0)
Monocytes Absolute: 0.2 10*3/uL (ref 0.1–1.0)
Monocytes Relative: 2 %
Neutro Abs: 0.1 10*3/uL — CL (ref 1.7–7.7)
Neutrophils Relative %: 1 %
Platelets: 252 10*3/uL (ref 150–400)
RBC: 2.83 MIL/uL — ABNORMAL LOW (ref 4.22–5.81)
RDW: 16.9 % — ABNORMAL HIGH (ref 11.5–15.5)
WBC: 14.3 10*3/uL — ABNORMAL HIGH (ref 4.0–10.5)
nRBC: 0 % (ref 0.0–0.2)

## 2022-03-09 LAB — BASIC METABOLIC PANEL
Anion gap: 4 — ABNORMAL LOW (ref 5–15)
BUN: 6 mg/dL — ABNORMAL LOW (ref 8–23)
CO2: 20 mmol/L — ABNORMAL LOW (ref 22–32)
Calcium: 7.7 mg/dL — ABNORMAL LOW (ref 8.9–10.3)
Chloride: 112 mmol/L — ABNORMAL HIGH (ref 98–111)
Creatinine, Ser: 0.54 mg/dL — ABNORMAL LOW (ref 0.61–1.24)
GFR, Estimated: >60 mL/min (ref >60)
Glucose, Bld: 85 mg/dL (ref 70–99)
Potassium: 3.5 mmol/L (ref 3.5–5.1)
Sodium: 136 mmol/L (ref 135–145)

## 2022-03-09 LAB — PHOSPHORUS: Phosphorus: 2.6 mg/dL (ref 2.5–4.6)

## 2022-03-09 LAB — MAGNESIUM
Magnesium: 1.6 mg/dL — ABNORMAL LOW (ref 1.7–2.4)
Magnesium: 1.6 mg/dL — ABNORMAL LOW (ref 1.7–2.4)

## 2022-03-09 MED ORDER — PROSOURCE PLUS PO LIQD
30.0000 mL | Freq: Three times a day (TID) | ORAL | Status: DC
  Administered 2022-03-09 – 2022-03-15 (×19): 30 mL via ORAL
  Filled 2022-03-09 (×19): qty 30

## 2022-03-09 MED ORDER — MAGNESIUM SULFATE 2 GM/50ML IV SOLN
2.0000 g | Freq: Once | INTRAVENOUS | Status: AC
  Administered 2022-03-09: 2 g via INTRAVENOUS
  Filled 2022-03-09: qty 50

## 2022-03-09 MED ORDER — ENSURE ENLIVE PO LIQD
237.0000 mL | Freq: Three times a day (TID) | ORAL | Status: DC
  Administered 2022-03-09 – 2022-03-15 (×8): 237 mL via ORAL

## 2022-03-09 MED ORDER — K PHOS MONO-SOD PHOS DI & MONO 155-852-130 MG PO TABS
250.0000 mg | ORAL_TABLET | Freq: Two times a day (BID) | ORAL | Status: AC
  Administered 2022-03-09 – 2022-03-10 (×4): 250 mg via ORAL
  Filled 2022-03-09 (×4): qty 1

## 2022-03-09 NOTE — Progress Notes (Signed)
PROGRESS NOTE    [facility]  KKX:[id number] DOB: 09/10/1950 DOA: 03/02/2022 PCP: Redmond School, MD   Brief Narrative:   71 y.o. male with medical history significant of CLL, stage IV lung cancer with bony metastases currently on chemotherapy and has received radiation, hypertension, hyperlipidemia, COPD presented with rectal pain and hematuria with increasing confusion.  On presentation, he was found to be neutropenic.  CT of abdomen and pelvis with contrast showed acute pancreatitis but no abscess or pseudocyst along with right iliac bone infiltrative lytic masses consistent with metastases.  CT of the head without contrast was negative for any acute intracranial abnormality.  He was started on IV fluids, antibiotics and Granix.  Assessment & Plan:   Pseudomonas bacteremia -Sensitivities demonstrating pansensitive microorganism Vancomycin has been discontinued.  Continue with IV cefepime -Repeat blood cultures 12/6 remains negative, continue to monitor  If persistently bacteremic, might have to consider removing of port. -he has remained afebrile.  Neutropenic fever -Presented with neutrophils of 0.  Currently on Granix as per Dr. Delton Coombes who recommended to continue Granix till [facility] is more than 1000.  Antibiotic plan as above.  Imaging study as above -ANC 0.1 currently. -Repeat CBC with differential daily  Stage IV lung cancer with bony metastasis/CLL -With cancer associated chronic pain -Follows up with Dr. Delton Coombes as an outpatient.  Has received radiation treatment recently.  Also undergoing chemotherapy.  Follow further recommendations from oncology. -Palliative care consultation appreciated; continue full code and full scope of practice. -Currently on scheduled OxyContin along with as needed oxycodone.  Oral thrush -Complains of sore throat and has evidence of oral thrush.  -Continue treatment with denies statin, much improved on current treatment.  Paroxysmal A-fib  with RVR -Patient had episodes of A-fib with RVR on 03/03/2022 requiring oral and IV metoprolol.   -Rate currently controlled.  -Appreciate cardiology following and recommendations.   -Continue Eliquis for secondary prevention..  Elevated lipase -Imaging studies showed acute pancreatitis but clinically patient does not have any symptoms or signs of pancreatitis with benign abdominal appreciated on examination -Most likely acute phase reactant.  Failure to thrive/Severe malnutrition -Nutrition following.   -Continue Megace. -Patient advised to increase oral intake. -Patient's family has been instructed to bring food from outside if needed to increase oral intake and broader options. -started  on protein supplements,  Hypertension -Blood pressure on the lower side.  Continue metoprolol if blood pressure allows.  Hypokalemia -Continue repletion and follow electrolytes trend. -Potassium 2.6 today.  Hypomagnesemia -Pleated, recheck in a.m.  Hyperlipidemia -Continue statin -Continue to follow LFTs and lipid profile as an outpatient.  Depression -Continue bupropion -No suicidal ideation or hallucination.  Possible PE -Patient was empirically started on Eliquis during his last most recent hospitalization from 01/26/2022-01/30/2022 as CT angiogram could not rule out pulmonary embolism -Continue treatment with Eliquis -Good saturation on room air.  Physical deconditioning -PT recommends home health PT -Expressing feeling weak and tired. -Home health orders will be placed at time of discharge.  Dysuria/rectal pain -Some improvement reported after Foley catheter removed -Given the still having symptoms we will use empirical Anusol cream and PDN -Maintain adequate hydration and follow clinical response.  DVT prophylaxis: Eliquis Code Status: Full Family Communication: Wife at bedside Disposition Plan: Status is: Inpatient Remains inpatient appropriate because: Of severity of  illness   Consultants: Oncology/palliative care/cardiology Procedures: None  Antimicrobials:  Anti-infectives (From admission, onward)    Start     Dose/Rate Route Frequency Ordered Stop   03/04/22  1400  ceFEPIme (MAXIPIME) 2 g in sodium chloride 0.9 % 100 mL IVPB        2 g 200 mL/hr over 30 Minutes Intravenous Every 8 hours 03/04/22 0807     03/03/22 2300  vancomycin (VANCOREADY) IVPB 1250 mg/250 mL  Status:  Discontinued        1,250 mg 166.7 mL/hr over 90 Minutes Intravenous Every 24 hours 03/02/22 2148 03/04/22 0755   03/03/22 1000  valACYclovir (VALTREX) tablet 500 mg        500 mg Oral Daily 03/02/22 2220     03/03/22 0600  ceFEPIme (MAXIPIME) 2 g in sodium chloride 0.9 % 100 mL IVPB  Status:  Discontinued        2 g 200 mL/hr over 30 Minutes Intravenous Every 12 hours 03/02/22 2148 03/02/22 2149   03/03/22 0600  ceFEPIme (MAXIPIME) 2 g in sodium chloride 0.9 % 100 mL IVPB  Status:  Discontinued        2 g 200 mL/hr over 30 Minutes Intravenous Every 12 hours 03/02/22 2149 03/04/22 0807   03/02/22 2200  vancomycin (VANCOREADY) IVPB 1500 mg/300 mL        1,500 mg 150 mL/hr over 120 Minutes Intravenous  Once 03/02/22 2135 03/03/22 0043   03/02/22 1815  ceFEPIme (MAXIPIME) 2 g in sodium chloride 0.9 % 100 mL IVPB        2 g 200 mL/hr over 30 Minutes Intravenous  Once 03/02/22 1800 03/02/22 1908   03/02/22 1815  metroNIDAZOLE (FLAGYL) IVPB 500 mg        500 mg 100 mL/hr over 60 Minutes Intravenous  Once 03/02/22 1800 03/02/22 2016        Subjective: Remains  afebrile, reports he is more ambulatory right now, no further dysuria after condom cath applied, remains with poor appetite, discussed with him and wife about bringing supplements he like from home includes [employer] brand   Objective: Vitals:   03/08/22 1203 03/08/22 2050 03/09/22 0559 03/09/22 0615  BP: 118/67 120/74 139/83   Pulse: 85 (!) 101 94   Resp: 16 16 16    Temp: 98.1 F (36.7 C) (!) 97.4 F (36.3 C) 98  F (36.7 C)   TempSrc: Oral Oral Oral   SpO2: 100% 100% 99%   Weight:    66 kg  Height:        Intake/Output Summary (Last 24 hours) at 03/09/2022 1202 Last data filed at 03/09/2022 0900 Gross per 24 hour  Intake 7868.42 ml  Output 2801 ml  Net 5067.42 ml   Filed Weights   03/07/22 0552 03/08/22 0510 03/09/22 0615  Weight: 67.4 kg 66.5 kg 66 kg    Examination:  Awake Alert, Oriented X 3, frail, deconditioned Right upper chest Port-A-Cath site looks clean Symmetrical Chest wall movement, Good air movement bilaterally, CTAB RRR,No Gallops,Rubs or new Murmurs, No Parasternal Heave +ve B.Sounds, Abd Soft, No tenderness, No rebound - guarding or rigidity. No Cyanosis, Clubbing or edema, No new Rash or bruise    Data Reviewed: I have personally reviewed following labs and imaging studies  CBC: Recent Labs  Lab 03/02/22 1356 03/03/22 0612 03/04/22 0607 03/05/22 0631 03/06/22 0505 03/07/22 1202 03/09/22 0405  WBC 26.5*   < > 5.9 8.5 9.1 28.8* 14.3*  NEUTROABS 0.0*  --   --  0.0* 0.0* 0.1* 0.1*  HGB 10.9*   < > 8.0* 7.8* 7.6* 9.5* 8.5*  HCT 34.0*   < > 25.0* 25.1* 24.1* 30.3* 26.8*  MCV  94.2   < > 95.1 94.7 94.5 94.4 94.7  PLT 212   < > 106* 142* 165 293 252   < > = values in this interval not displayed.   Basic Metabolic Panel: Recent Labs  Lab 03/04/22 0607 03/05/22 0631 03/06/22 0505 03/07/22 1202 03/08/22 0503 03/09/22 0405  NA 135 136 134* 133* 133* 136  K 2.8* 3.1* 2.9* 3.6 2.9* 3.5  CL 108 111 109 107 103 112*  CO2 22 19* 20* 20* 22 20*  GLUCOSE 108* 83 81 94 86 85  BUN 20 13 9 8  7* 6*  CREATININE 0.70 0.49* 0.53* 0.63 0.53* 0.54*  CALCIUM 8.1* 8.1* 7.7* 8.4* 7.5* 7.7*  MG 1.7 1.3* 1.5* 1.4*  --  1.6*  PHOS  --   --   --   --   --  2.6   GFR: Estimated Creatinine Clearance: 80.2 mL/min (A) (by C-G formula based on SCr of 0.54 mg/dL (L)).  Liver Function Tests: Recent Labs  Lab 03/02/22 1356 03/04/22 0607 03/06/22 0505  AST 13* 16 16  ALT 13  17 18   ALKPHOS 72 50 44  BILITOT 1.2 0.5 0.6  PROT 6.3* 4.4* 4.1*  ALBUMIN 3.2* 2.1* 1.9*   Recent Labs  Lab 03/02/22 1356 03/03/22 0612  LIPASE 72* 25   Recent Labs  Lab 03/02/22 1400  AMMONIA <10   Sepsis Labs: Recent Labs  Lab 03/02/22 1356 03/02/22 1716 03/02/22 1832 03/02/22 2054  LATICACIDVEN 1.7 3.0* 1.5 0.9    Recent Results (from the past 240 hour(s))  Blood culture (routine x 2)     Status: Abnormal   Collection Time: 03/02/22  2:01 PM   Specimen: Right Antecubital; Blood  Result Value Ref Range Status   Specimen Description   Final    RIGHT ANTECUBITAL BOTTLES DRAWN AEROBIC AND ANAEROBIC Performed at [facility], [address], [place], [place] [address]    Special Requests   Final    Blood Culture results may not be optimal due to an excessive volume of blood received in culture bottles Performed at [facility], [address], [place], [place] [address]    Culture  Setup Time   Final    GRAM NEGATIVE RODS AEROBIC BOTTLE ONLY Gram Stain Report Called to,Read Back By and Verified With:  T. ISLEY @ 1217 BY STEPHTR 03/03/22 Performed at [facility], [address] [place], [place] [address]    Culture (A)  Final    PSEUDOMONAS AERUGINOSA SUSCEPTIBILITIES PERFORMED ON PREVIOUS CULTURE WITHIN THE LAST 5 DAYS. Performed at [facility], [place] [address] [place], [place] [address]    Report Status 03/05/2022 FINAL  Final  Blood culture (routine x 2)     Status: Abnormal   Collection Time: 03/02/22  2:06 PM   Specimen: Left Antecubital; Blood  Result Value Ref Range Status   Specimen Description   Final    LEFT ANTECUBITAL BOTTLES DRAWN AEROBIC AND ANAEROBIC Performed at [facility], [address], [place], [place] [address]    Special Requests   Final    Blood Culture results may not be optimal due to an excessive volume of blood received in culture bottles Performed at [facility], [address] [place],  [address]    Culture   Setup Time   Final    GRAM NEGATIVE RODS Gram Stain Report Called to,Read Back By and Verified With:  T.ISLEY @ 1217 BY STEPHTR 03/03/22 AEROBIC BOTTLE ONLY Organism ID to follow  CRITICAL RESULT CALLED TO, READ BACK BY AND VERIFIED WITH:  Alberteen Sam, RN 03/03/22 1619 A. LAFRANCE Performed at [facility], [place] [address], [place], [place] [address]    Culture PSEUDOMONAS AERUGINOSA (A)  Final   Report Status 03/05/2022 FINAL  Final   Organism ID, Bacteria PSEUDOMONAS AERUGINOSA  Final      Susceptibility   Pseudomonas aeruginosa - MIC*    CEFTAZIDIME 2 SENSITIVE Sensitive     CIPROFLOXACIN <=0.25 SENSITIVE Sensitive     GENTAMICIN <=1 SENSITIVE Sensitive     IMIPENEM 2 SENSITIVE Sensitive     PIP/TAZO <=4 SENSITIVE Sensitive     CEFEPIME 2 SENSITIVE Sensitive     * PSEUDOMONAS AERUGINOSA  Blood Culture ID Panel (Reflexed)     Status: Abnormal   Collection Time: 03/02/22  2:06 PM  Result Value Ref Range Status   Enterococcus faecalis NOT DETECTED NOT DETECTED Final   Enterococcus Faecium NOT DETECTED NOT DETECTED Final   Listeria monocytogenes NOT DETECTED NOT DETECTED Final   Staphylococcus species NOT DETECTED NOT DETECTED Final   Staphylococcus aureus (BCID) NOT DETECTED NOT DETECTED Final   Staphylococcus epidermidis NOT DETECTED NOT DETECTED Final   Staphylococcus lugdunensis NOT DETECTED NOT DETECTED Final   Streptococcus species NOT DETECTED NOT DETECTED Final   Streptococcus agalactiae NOT DETECTED NOT DETECTED Final   Streptococcus pneumoniae NOT DETECTED NOT DETECTED Final   Streptococcus pyogenes NOT DETECTED NOT DETECTED Final   A.calcoaceticus-baumannii NOT DETECTED NOT DETECTED Final   Bacteroides fragilis NOT DETECTED NOT DETECTED Final   Enterobacterales NOT DETECTED NOT DETECTED Final   Enterobacter cloacae complex NOT DETECTED NOT DETECTED Final   Escherichia coli NOT DETECTED NOT DETECTED Final   Klebsiella aerogenes NOT DETECTED NOT DETECTED Final    Klebsiella oxytoca NOT DETECTED NOT DETECTED Final   Klebsiella pneumoniae NOT DETECTED NOT DETECTED Final   Proteus species NOT DETECTED NOT DETECTED Final   Salmonella species NOT DETECTED NOT DETECTED Final   Serratia marcescens NOT DETECTED NOT DETECTED Final   Haemophilus influenzae NOT DETECTED NOT DETECTED Final   Neisseria meningitidis NOT DETECTED NOT DETECTED Final   Pseudomonas aeruginosa DETECTED (A) NOT DETECTED Final    Comment: CRITICAL RESULT CALLED TO, READ BACK BY AND VERIFIED WITH:  Alberteen Sam, RN 03/03/22 1619 A. LAFRANCE     Stenotrophomonas maltophilia NOT DETECTED NOT DETECTED Final   Candida albicans NOT DETECTED NOT DETECTED Final   Candida auris NOT DETECTED NOT DETECTED Final   Candida glabrata NOT DETECTED NOT DETECTED Final   Candida krusei NOT DETECTED NOT DETECTED Final   Candida parapsilosis NOT DETECTED NOT DETECTED Final   Candida tropicalis NOT DETECTED NOT DETECTED Final   Cryptococcus neoformans/gattii NOT DETECTED NOT DETECTED Final   CTX-M ESBL NOT DETECTED NOT DETECTED Final   Carbapenem resistance IMP NOT DETECTED NOT DETECTED Final   Carbapenem resistance KPC NOT DETECTED NOT DETECTED Final   Carbapenem resistance NDM NOT DETECTED NOT DETECTED Final   Carbapenem resistance VIM NOT DETECTED NOT DETECTED Final    Comment: Performed at [facility], [address], [place], [place] [address]  Resp Panel by RT-PCR (Flu A&B, Covid) Anterior Nasal Swab     Status: None   Collection Time: 03/02/22  3:08 PM   Specimen: Anterior Nasal Swab  Result Value Ref Range Status   SARS Coronavirus 2 by RT PCR NEGATIVE NEGATIVE Final    Comment: (NOTE) SARS-CoV-2 target nucleic acids are NOT DETECTED.  The SARS-CoV-2 RNA is generally detectable in upper respiratory specimens during the acute phase of infection. The lowest concentration of SARS-CoV-2 viral copies this assay can detect is 138 copies/mL. A negative result does not preclude  SARS-Cov-2 infection and should not be used as the sole basis for treatment or other patient management decisions. A negative result may occur with  improper specimen collection/handling, submission of specimen other than nasopharyngeal swab, presence of viral mutation(s) within the areas targeted by this assay, and inadequate number of viral copies(<138 copies/mL). A negative result must be combined with clinical observations, patient history, and epidemiological information. The expected result is Negative.  Fact Sheet for Patients:  EntrepreneurPulse.com.au  Fact Sheet for Healthcare Providers:  IncredibleEmployment.be  This test is no t yet approved or cleared by the [place] FDA and  has been authorized for detection and/or diagnosis of SARS-CoV-2 by FDA under an Emergency Use Authorization (EUA). This EUA will remain  in effect (meaning this test can be used) for the duration of the COVID-19 declaration under Section 564(b)(1) of the Act, 21 U.S.C.section 360bbb-3(b)(1), unless the authorization is terminated  or revoked sooner.       Influenza A by PCR NEGATIVE NEGATIVE Final   Influenza B by PCR NEGATIVE NEGATIVE Final    Comment: (NOTE) The Xpert Xpress SARS-CoV-2/FLU/RSV plus assay is intended as an aid in the diagnosis of influenza from Nasopharyngeal swab specimens and should not be used as a sole basis for treatment. Nasal washings and aspirates are unacceptable for Xpert Xpress SARS-CoV-2/FLU/RSV testing.  Fact Sheet for Patients: EntrepreneurPulse.com.au  Fact Sheet for Healthcare Providers: IncredibleEmployment.be  This test is not yet approved or cleared by the [place] FDA and has been authorized for detection and/or diagnosis of SARS-CoV-2 by FDA under an Emergency Use Authorization (EUA). This EUA will remain in effect (meaning this test can be used) for the duration of  the COVID-19 declaration under Section 564(b)(1) of the Act, 21 U.S.C. section 360bbb-3(b)(1), unless the authorization is terminated or revoked.  Performed at [facility], [address] [place], [place] [address]   Urine Culture     Status: None   Collection Time: 03/02/22  7:03 PM   Specimen: Urine, Clean Catch  Result Value Ref Range Status   Specimen Description   Final    URINE, CLEAN CATCH Performed at [facility], [address], [place], [place] [address]    Special Requests   Final    NONE Performed at [facility], [address], [place], [place] [address]    Culture   Final    NO GROWTH Performed at [facility], [place] [address] [place], [place] [address]    Report Status 03/04/2022 FINAL  Final  Culture, blood (Routine X 2) w Reflex to ID Panel     Status: None (Preliminary result)   Collection Time: 03/06/22  1:48 PM   Specimen: BLOOD RIGHT HAND  Result Value Ref Range Status   Specimen Description BLOOD RIGHT HAND  Final   Special Requests   Final    BOTTLES DRAWN AEROBIC AND ANAEROBIC Blood Culture adequate volume   Culture   Final    NO GROWTH 3 DAYS Performed at [facility], [address] [place],  [address]    Report Status PENDING  Incomplete     Radiology Studies: No results found.  Scheduled Meds:  (feeding supplement) PROSource Plus  30 mL Oral TID BM   apixaban  5 mg Oral BID  buPROPion  150 mg Oral Daily   Chlorhexidine Gluconate Cloth  6 each Topical Daily   feeding supplement  237 mL Oral TID BM   hydrocortisone   Rectal BID   megestrol  400 mg Oral BID   metoprolol tartrate  50 mg Oral BID   midodrine  5 mg Oral TID WC   nystatin  5 mL Oral QID   oxybutynin  2.5 mg Oral BID   oxyCODONE  20 mg Oral Q12H   phenazopyridine  100 mg Oral TID WC   pravastatin  20 mg Oral q1800   senna-docusate  2 tablet Oral BID   Tbo-filgastrim (GRANIX) SQ  480 mcg Subcutaneous q1800   valACYclovir  500 mg Oral Daily   Continuous  Infusions:  sodium chloride 100 mL/hr at 03/09/22 0619   ceFEPime (MAXIPIME) IV 2 g (03/09/22 8891)    Phillips Climes, MD [facility] 03/09/2022, 12:02 PM

## 2022-03-10 DIAGNOSIS — I480 Paroxysmal atrial fibrillation: Secondary | ICD-10-CM | POA: Diagnosis not present

## 2022-03-10 DIAGNOSIS — R000 Tachycardia, unspecified: Secondary | ICD-10-CM | POA: Diagnosis not present

## 2022-03-10 DIAGNOSIS — I350 Nonrheumatic aortic (valve) stenosis: Secondary | ICD-10-CM | POA: Diagnosis not present

## 2022-03-10 DIAGNOSIS — I351 Nonrheumatic aortic (valve) insufficiency: Secondary | ICD-10-CM | POA: Diagnosis not present

## 2022-03-10 LAB — CBC WITH DIFFERENTIAL/PLATELET
Abs Immature Granulocytes: 0.02 10*3/uL (ref 0.00–0.07)
Abs Immature Granulocytes: 0.06 10*3/uL (ref 0.00–0.07)
Basophils Absolute: 0 10*3/uL (ref 0.0–0.1)
Basophils Absolute: 0.1 10*3/uL (ref 0.0–0.1)
Basophils Relative: 0 %
Basophils Relative: 0 %
Eosinophils Absolute: 0 10*3/uL (ref 0.0–0.5)
Eosinophils Absolute: 0 10*3/uL (ref 0.0–0.5)
Eosinophils Relative: 0 %
Eosinophils Relative: 0 %
HCT: 26.6 % — ABNORMAL LOW (ref 39.0–52.0)
HCT: 28.5 % — ABNORMAL LOW (ref 39.0–52.0)
Hemoglobin: 8.2 g/dL — ABNORMAL LOW (ref 13.0–17.0)
Hemoglobin: 8.9 g/dL — ABNORMAL LOW (ref 13.0–17.0)
Immature Granulocytes: 0 %
Immature Granulocytes: 0 %
Lymphocytes Relative: 96 %
Lymphocytes Relative: 97 %
Lymphs Abs: 13.8 10*3/uL — ABNORMAL HIGH (ref 0.7–4.0)
Lymphs Abs: 23.1 10*3/uL — ABNORMAL HIGH (ref 0.7–4.0)
MCH: 29.1 pg (ref 26.0–34.0)
MCH: 29.7 pg (ref 26.0–34.0)
MCHC: 30.8 g/dL (ref 30.0–36.0)
MCHC: 31.2 g/dL (ref 30.0–36.0)
MCV: 94.3 fL (ref 80.0–100.0)
MCV: 95 fL (ref 80.0–100.0)
Monocytes Absolute: 0.4 10*3/uL (ref 0.1–1.0)
Monocytes Absolute: 0.6 10*3/uL (ref 0.1–1.0)
Monocytes Relative: 3 %
Monocytes Relative: 2 %
Neutro Abs: 0.2 10*3/uL — CL (ref 1.7–7.7)
Neutro Abs: 0.2 10*3/uL — CL (ref 1.7–7.7)
Neutrophils Relative %: 1 %
Neutrophils Relative %: 1 %
Platelets: 247 10*3/uL (ref 150–400)
Platelets: 308 10*3/uL (ref 150–400)
RBC: 2.82 MIL/uL — ABNORMAL LOW (ref 4.22–5.81)
RBC: 3 MIL/uL — ABNORMAL LOW (ref 4.22–5.81)
RDW: 17 % — ABNORMAL HIGH (ref 11.5–15.5)
RDW: 17.1 % — ABNORMAL HIGH (ref 11.5–15.5)
WBC: 14.5 10*3/uL — ABNORMAL HIGH (ref 4.0–10.5)
WBC: 23.9 10*3/uL — ABNORMAL HIGH (ref 4.0–10.5)
nRBC: 0 % (ref 0.0–0.2)
nRBC: 0.1 % (ref 0.0–0.2)

## 2022-03-10 LAB — BASIC METABOLIC PANEL
Anion gap: 3 — ABNORMAL LOW (ref 5–15)
BUN: 10 mg/dL (ref 8–23)
CO2: 21 mmol/L — ABNORMAL LOW (ref 22–32)
Calcium: 7.7 mg/dL — ABNORMAL LOW (ref 8.9–10.3)
Chloride: 110 mmol/L (ref 98–111)
Creatinine, Ser: 0.6 mg/dL — ABNORMAL LOW (ref 0.61–1.24)
GFR, Estimated: >60 mL/min (ref >60)
Glucose, Bld: 94 mg/dL (ref 70–99)
Potassium: 3.3 mmol/L — ABNORMAL LOW (ref 3.5–5.1)
Sodium: 134 mmol/L — ABNORMAL LOW (ref 135–145)

## 2022-03-10 MED ORDER — OXYBUTYNIN CHLORIDE 5 MG PO TABS
2.5000 mg | ORAL_TABLET | Freq: Three times a day (TID) | ORAL | Status: DC | PRN
  Administered 2022-03-12: 2.5 mg via ORAL
  Filled 2022-03-10: qty 1

## 2022-03-10 MED ORDER — POTASSIUM CHLORIDE CRYS ER 20 MEQ PO TBCR
40.0000 meq | EXTENDED_RELEASE_TABLET | Freq: Once | ORAL | Status: AC
  Administered 2022-03-10: 40 meq via ORAL
  Filled 2022-03-10: qty 2

## 2022-03-10 NOTE — Progress Notes (Signed)
PROGRESS NOTE    Alfred Brown [facility]  FUX:[id number] DOB: [birth year] DOA: 03/02/2022 PCP: Redmond School, MD   Brief Narrative:  71 y.o. male with medical history significant of CLL, stage IV lung cancer with bony metastases currently on chemotherapy and has received radiation, hypertension, hyperlipidemia, COPD presented with rectal pain and hematuria with increasing confusion.  On presentation, he was found to be neutropenic.  CT of abdomen and pelvis with contrast showed acute pancreatitis but no abscess or pseudocyst along with right iliac bone infiltrative lytic masses consistent with metastases.  CT of the head without contrast was negative for any acute intracranial abnormality.  He was started on IV fluids, antibiotics and Granix.  Assessment & Plan:   Pseudomonas bacteremia -Sensitivities demonstrating pansensitive microorganism Vancomycin has been discontinued. -Will repeat blood cultures today.  If persistently bacteremic, might have to consider removing of port. -She has remained afebrile.  Neutropenic fever -Presented with neutrophils of 0.  Currently on Granix as per Dr. Delton Coombes who recommended to continue Granix till [facility] is more than 1000.  Antibiotic plan as above.  Imaging study as above -ANC 0.2 currently. -Repeat CBC with differential in AM. -Discussed in am with oncology service regarding the use of IVIG  Stage IV lung cancer with bony metastasis/CLL -With cancer associated chronic pain -Follows up with Dr. Delton Coombes as an outpatient.  Has received radiation treatment recently.  Also undergoing chemotherapy.  Follow further recommendations from oncology. -Palliative care consultation appreciated; continue full code and full scope of practice. -Currently on scheduled OxyContin along with as needed oxycodone.  Oral thrush -Complains of sore throat and has evidence of oral thrush.  -Continue treatment with denies statin  Paroxysmal A-fib with RVR -Patient had episodes  of A-fib with RVR on 03/03/2022 requiring oral and IV metoprolol.   -Rate currently controlled.  -Appreciate cardiology following and recommendations.   -Continue Eliquis for secondary prevention..  Elevated lipase -Imaging studies showed acute pancreatitis but clinically patient does not have any symptoms or signs of pancreatitis with benign abdominal appreciated on examination -Most likely acute phase reactant.  Failure to thrive/Severe malnutrition -Nutrition following.   -Continue Megace. -Patient advised to increase oral intake. -Patient's family has been instructed to bring food from outside if needed to increase oral intake and broader options.  Hypertension -Blood pressure on the lower side.  Continue metoprolol if blood pressure allows.  Hypokalemia -Continue repletion and follow electrolytes trend. -Potassium 2.6 today.  Hypomagnesemia -Replace.  Repeat a.m. labs  Hyperlipidemia -Continue statin -Continue to follow LFTs and lipid profile as an outpatient.  Depression -Continue bupropion -No suicidal ideation or hallucination.  Possible PE -Patient was empirically started on Eliquis during his last most recent hospitalization from 01/26/2022-01/30/2022 as CT angiogram could not rule out pulmonary embolism -Continue treatment with Eliquis -Good saturation on room air.  Physical deconditioning -PT recommends home health PT -Expressing feeling weak and tired. -Home health orders will be placed at time of discharge.  Dysuria/rectal pain -Some improvement reported after Foley catheter removed -Given the still having symptoms we will use empirical Anusol cream and PDN -Maintain adequate hydration and follow clinical response.  DVT prophylaxis: Eliquis Code Status: Full Family Communication: Wife at bedside Disposition Plan: Status is: Inpatient Remains inpatient appropriate because: Of severity of illness   Consultants: Oncology/palliative  care/cardiology Procedures: None  Antimicrobials:  Anti-infectives (From admission, onward)    Start     Dose/Rate Route Frequency Ordered Stop   03/04/22 1400  ceFEPIme (MAXIPIME) 2 g in  sodium chloride 0.9 % 100 mL IVPB        2 g 200 mL/hr over 30 Minutes Intravenous Every 8 hours 03/04/22 0807     03/03/22 2300  vancomycin (VANCOREADY) IVPB 1250 mg/250 mL  Status:  Discontinued        1,250 mg 166.7 mL/hr over 90 Minutes Intravenous Every 24 hours 03/02/22 2148 03/04/22 0755   03/03/22 1000  valACYclovir (VALTREX) tablet 500 mg        500 mg Oral Daily 03/02/22 2220     03/03/22 0600  ceFEPIme (MAXIPIME) 2 g in sodium chloride 0.9 % 100 mL IVPB  Status:  Discontinued        2 g 200 mL/hr over 30 Minutes Intravenous Every 12 hours 03/02/22 2148 03/02/22 2149   03/03/22 0600  ceFEPIme (MAXIPIME) 2 g in sodium chloride 0.9 % 100 mL IVPB  Status:  Discontinued        2 g 200 mL/hr over 30 Minutes Intravenous Every 12 hours 03/02/22 2149 03/04/22 0807   03/02/22 2200  vancomycin (VANCOREADY) IVPB 1500 mg/300 mL        1,500 mg 150 mL/hr over 120 Minutes Intravenous  Once 03/02/22 2135 03/03/22 0043   03/02/22 1815  ceFEPIme (MAXIPIME) 2 g in sodium chloride 0.9 % 100 mL IVPB        2 g 200 mL/hr over 30 Minutes Intravenous  Once 03/02/22 1800 03/02/22 1908   03/02/22 1815  metroNIDAZOLE (FLAGYL) IVPB 500 mg        500 mg 100 mL/hr over 60 Minutes Intravenous  Once 03/02/22 1800 03/02/22 2016        Subjective: Febrile, complaining of dysuria, increased frequency and bladder spasms; no chest pain.  No nausea vomiting.   Objective: Vitals:   03/09/22 2106 03/09/22 2220 03/10/22 0439 03/10/22 1359  BP: 118/71 (!) 106/56 (!) 112/53 117/70  Pulse: 95 92 97 92  Resp: 18 18 16 18   Temp: 98.9 F (37.2 C) 98 F (36.7 C) 98.3 F (36.8 C) 98.2 F (36.8 C)  TempSrc: Oral Oral Oral Oral  SpO2: 98% 98% 99% 99%  Weight:   65 kg   Height:        Intake/Output Summary (Last 24  hours) at 03/10/2022 1653 Last data filed at 03/10/2022 0900 Gross per 24 hour  Intake 720 ml  Output 1350 ml  Net -630 ml   Filed Weights   03/08/22 0510 03/09/22 0615 03/10/22 0439  Weight: 66.5 kg 66 kg 65 kg    Examination: General exam: Alert, awake, oriented x 3; no fever, no chest pain, no nausea vomiting.  Continue complaining of dysuria and bladder spasm. Respiratory system: Clear to auscultation. Respiratory effort normal.  Good saturation on room air. Cardiovascular system: Rate controlled.. No rubs or gallops; no JVD on exam. Gastrointestinal system: Abdomen is nondistended, soft and nontender. No organomegaly or masses felt. Normal bowel sounds heard. Central nervous system: Alert and oriented. No focal neurological deficits. Extremities: No cyanosis or clubbing. Skin: No petechiae. Psychiatry: Judgement and insight appear normal. Mood & affect appropriate.   Data Reviewed: I have personally reviewed following labs and imaging studies  CBC: Recent Labs  Lab 03/05/22 0631 03/06/22 0505 03/07/22 1202 03/09/22 0405 03/10/22 0612  WBC 8.5 9.1 28.8* 14.3* 14.5*  NEUTROABS 0.0* 0.0* 0.1* 0.1* 0.2*  HGB 7.8* 7.6* 9.5* 8.5* 8.2*  HCT 25.1* 24.1* 30.3* 26.8* 26.6*  MCV 94.7 94.5 94.4 94.7 94.3  PLT 142* 165 293  252 166   Basic Metabolic Panel: Recent Labs  Lab 03/05/22 0631 03/06/22 0505 03/07/22 1202 03/08/22 0503 03/09/22 0405 03/09/22 1244 03/10/22 0612  NA 136 134* 133* 133* 136  --  134*  K 3.1* 2.9* 3.6 2.9* 3.5  --  3.3*  CL 111 109 107 103 112*  --  110  CO2 19* 20* 20* 22 20*  --  21*  GLUCOSE 83 81 94 86 85  --  94  BUN 13 9 8  7* 6*  --  10  CREATININE 0.49* 0.53* 0.63 0.53* 0.54*  --  0.60*  CALCIUM 8.1* 7.7* 8.4* 7.5* 7.7*  --  7.7*  MG 1.3* 1.5* 1.4*  --  1.6* 1.6*  --   PHOS  --   --   --   --  2.6  --   --    GFR: Estimated Creatinine Clearance: 79 mL/min (A) (by C-G formula based on SCr of 0.6 mg/dL (L)).  Liver Function  Tests: Recent Labs  Lab 03/04/22 0607 03/06/22 0505  AST 16 16  ALT 17 18  ALKPHOS 50 44  BILITOT 0.5 0.6  PROT 4.4* 4.1*  ALBUMIN 2.1* 1.9*   Recent Results (from the past 240 hour(s))  Blood culture (routine x 2)     Status: Abnormal   Collection Time: 03/02/22  2:01 PM   Specimen: Right Antecubital; Blood  Result Value Ref Range Status   Specimen Description   Final    RIGHT ANTECUBITAL BOTTLES DRAWN AEROBIC AND ANAEROBIC Performed at [facility], [address] [place], [place] [address]    Special Requests   Final    Blood Culture results may not be optimal due to an excessive volume of blood received in culture bottles Performed at [facility], [address] [place], [place] [address]    Culture  Setup Time   Final    GRAM NEGATIVE RODS AEROBIC BOTTLE ONLY Gram Stain Report Called to,Read Back By and Verified With:  T. ISLEY @ 1217 BY STEPHTR 03/03/22 Performed at [facility], [address], [place], [place] [address]    Culture (A)  Final    PSEUDOMONAS AERUGINOSA SUSCEPTIBILITIES PERFORMED ON PREVIOUS CULTURE WITHIN THE LAST 5 DAYS. Performed at [facility], [place] [address], [place], [place] [address]    Report Status 03/05/2022 FINAL  Final  Blood culture (routine x 2)     Status: Abnormal   Collection Time: 03/02/22  2:06 PM   Specimen: Left Antecubital; Blood  Result Value Ref Range Status   Specimen Description   Final    LEFT ANTECUBITAL BOTTLES DRAWN AEROBIC AND ANAEROBIC Performed at [facility] [address] [place], [place] [address]    Special Requests   Final    Blood Culture results may not be optimal due to an excessive volume of blood received in culture bottles Performed at [facility], [address], [place], [place] [address]    Culture  Setup Time   Final    GRAM NEGATIVE RODS Gram Stain Report Called to,Read Back By and Verified With:  T.ISLEY @ 6283 BY STEPHTR 03/03/22 AEROBIC BOTTLE ONLY Organism ID to follow CRITICAL  RESULT CALLED TO, READ BACK BY AND VERIFIED WITH:  Alberteen Sam, RN 03/03/22 1619 A. LAFRANCE Performed at [facility], [place] [address] [place], [place] [address]    Culture PSEUDOMONAS AERUGINOSA (A)  Final   Report Status 03/05/2022 FINAL  Final   Organism ID, Bacteria PSEUDOMONAS  AERUGINOSA  Final      Susceptibility   Pseudomonas aeruginosa - MIC*    CEFTAZIDIME 2 SENSITIVE Sensitive     CIPROFLOXACIN <=0.25 SENSITIVE Sensitive     GENTAMICIN <=1 SENSITIVE Sensitive     IMIPENEM 2 SENSITIVE Sensitive     PIP/TAZO <=4 SENSITIVE Sensitive     CEFEPIME 2 SENSITIVE Sensitive     * PSEUDOMONAS AERUGINOSA  Blood Culture ID Panel (Reflexed)     Status: Abnormal   Collection Time: 03/02/22  2:06 PM  Result Value Ref Range Status   Enterococcus faecalis NOT DETECTED NOT DETECTED Final   Enterococcus Faecium NOT DETECTED NOT DETECTED Final   Listeria monocytogenes NOT DETECTED NOT DETECTED Final   Staphylococcus species NOT DETECTED NOT DETECTED Final   Staphylococcus aureus (BCID) NOT DETECTED NOT DETECTED Final   Staphylococcus epidermidis NOT DETECTED NOT DETECTED Final   Staphylococcus lugdunensis NOT DETECTED NOT DETECTED Final   Streptococcus species NOT DETECTED NOT DETECTED Final   Streptococcus agalactiae NOT DETECTED NOT DETECTED Final   Streptococcus pneumoniae NOT DETECTED NOT DETECTED Final   Streptococcus pyogenes NOT DETECTED NOT DETECTED Final   A.calcoaceticus-baumannii NOT DETECTED NOT DETECTED Final   Bacteroides fragilis NOT DETECTED NOT DETECTED Final   Enterobacterales NOT DETECTED NOT DETECTED Final   Enterobacter cloacae complex NOT DETECTED NOT DETECTED Final   Escherichia coli NOT DETECTED NOT DETECTED Final   Klebsiella aerogenes NOT DETECTED NOT DETECTED Final   Klebsiella oxytoca NOT DETECTED NOT DETECTED Final   Klebsiella pneumoniae NOT DETECTED NOT DETECTED Final   Proteus species NOT DETECTED NOT DETECTED Final   Salmonella species NOT DETECTED  NOT DETECTED Final   Serratia marcescens NOT DETECTED NOT DETECTED Final   Haemophilus influenzae NOT DETECTED NOT DETECTED Final   Neisseria meningitidis NOT DETECTED NOT DETECTED Final   Pseudomonas aeruginosa DETECTED (A) NOT DETECTED Final    Comment: CRITICAL RESULT CALLED TO, READ BACK BY AND VERIFIED WITH:  Alberteen Sam, RN 03/03/22 1619 A. LAFRANCE     Stenotrophomonas maltophilia NOT DETECTED NOT DETECTED Final   Candida albicans NOT DETECTED NOT DETECTED Final   Candida auris NOT DETECTED NOT DETECTED Final   Candida glabrata NOT DETECTED NOT DETECTED Final   Candida krusei NOT DETECTED NOT DETECTED Final   Candida parapsilosis NOT DETECTED NOT DETECTED Final   Candida tropicalis NOT DETECTED NOT DETECTED Final   Cryptococcus neoformans/gattii NOT DETECTED NOT DETECTED Final   CTX-M ESBL NOT DETECTED NOT DETECTED Final   Carbapenem resistance IMP NOT DETECTED NOT DETECTED Final   Carbapenem resistance KPC NOT DETECTED NOT DETECTED Final   Carbapenem resistance NDM NOT DETECTED NOT DETECTED Final   Carbapenem resistance VIM NOT DETECTED NOT DETECTED Final    Comment: Performed at [facility], [address], [place], [place] [address]  Resp Panel by RT-PCR (Flu A&B, Covid) Anterior Nasal Swab     Status: None   Collection Time: 03/02/22  3:08 PM   Specimen: Anterior Nasal Swab  Result Value Ref Range Status   SARS Coronavirus 2 by RT PCR NEGATIVE NEGATIVE Final    Comment: (NOTE) SARS-CoV-2 target nucleic acids are NOT DETECTED.  The SARS-CoV-2 RNA is generally detectable in upper respiratory specimens during the acute phase of infection. The lowest concentration of SARS-CoV-2 viral copies this assay can detect is 138 copies/mL. A negative result does not preclude SARS-Cov-2 infection and should not be used as the sole basis for treatment or other patient management decisions. A  negative result may occur with  improper specimen collection/handling, submission of  specimen other than nasopharyngeal swab, presence of viral mutation(s) within the areas targeted by this assay, and inadequate number of viral copies(<138 copies/mL). A negative result must be combined with clinical observations, patient history, and epidemiological information. The expected result is Negative.  Fact Sheet for Patients:  EntrepreneurPulse.com.au  Fact Sheet for Healthcare Providers:  IncredibleEmployment.be  This test is no t yet approved or cleared by the [place] FDA and  has been authorized for detection and/or diagnosis of SARS-CoV-2 by FDA under an Emergency Use Authorization (EUA). This EUA will remain  in effect (meaning this test can be used) for the duration of the COVID-19 declaration under Section 564(b)(1) of the Act, 21 U.S.C.section 360bbb-3(b)(1), unless the authorization is terminated  or revoked sooner.       Influenza A by PCR NEGATIVE NEGATIVE Final   Influenza B by PCR NEGATIVE NEGATIVE Final    Comment: (NOTE) The Xpert Xpress SARS-CoV-2/FLU/RSV plus assay is intended as an aid in the diagnosis of influenza from Nasopharyngeal swab specimens and should not be used as a sole basis for treatment. Nasal washings and aspirates are unacceptable for Xpert Xpress SARS-CoV-2/FLU/RSV testing.  Fact Sheet for Patients: EntrepreneurPulse.com.au  Fact Sheet for Healthcare Providers: IncredibleEmployment.be  This test is not yet approved or cleared by the [place] FDA and has been authorized for detection and/or diagnosis of SARS-CoV-2 by FDA under an Emergency Use Authorization (EUA). This EUA will remain in effect (meaning this test can be used) for the duration of the COVID-19 declaration under Section 564(b)(1) of the Act, 21 U.S.C. section 360bbb-3(b)(1), unless the authorization is terminated or revoked.  Performed at [facility], [address]  [place], [place] [address]   Urine Culture     Status: None   Collection Time: 03/02/22  7:03 PM   Specimen: Urine, Clean Catch  Result Value Ref Range Status   Specimen Description   Final    URINE, CLEAN CATCH Performed at [facility], [address] [place], [place] [address]    Special Requests   Final    NONE Performed at [facility], [address] [place], [place] [address]    Culture   Final    NO GROWTH Performed at [facility], [place] [address], [place], [place] [address]    Report Status 03/04/2022 FINAL  Final  Culture, blood (Routine X 2) w Reflex to ID Panel     Status: None (Preliminary result)   Collection Time: 03/06/22  1:48 PM   Specimen: BLOOD RIGHT HAND  Result Value Ref Range Status   Specimen Description BLOOD RIGHT HAND  Final   Special Requests   Final    BOTTLES DRAWN AEROBIC AND ANAEROBIC Blood Culture adequate volume   Culture   Final    NO GROWTH 4 DAYS Performed at [facility], [address], [place], [place] [address]    Report Status PENDING  Incomplete     Radiology Studies: No results found.  Scheduled Meds:  (feeding supplement) PROSource Plus  30 mL Oral TID BM   apixaban  5 mg Oral BID   buPROPion  150 mg Oral Daily   Chlorhexidine Gluconate Cloth  6 each Topical Daily   feeding supplement  237 mL Oral TID BM   hydrocortisone   Rectal BID   megestrol  400 mg Oral BID   metoprolol tartrate  50 mg Oral BID   midodrine  5 mg Oral TID WC   nystatin  5 mL Oral QID   oxyCODONE  20 mg Oral Q12H   phosphorus  250 mg Oral BID   pravastatin  20 mg Oral q1800   senna-docusate  2 tablet Oral BID   Tbo-filgastrim (GRANIX) SQ  480 mcg Subcutaneous q1800   valACYclovir  500 mg Oral Daily   Continuous Infusions:  sodium chloride 75 mL/hr at 03/09/22 1338   ceFEPime (MAXIPIME) IV 2 g (03/10/22 1231)    Barton Dubois, MD [facility] 03/10/2022, 4:53 PM

## 2022-03-11 DIAGNOSIS — I480 Paroxysmal atrial fibrillation: Secondary | ICD-10-CM | POA: Diagnosis not present

## 2022-03-11 DIAGNOSIS — I351 Nonrheumatic aortic (valve) insufficiency: Secondary | ICD-10-CM | POA: Diagnosis not present

## 2022-03-11 DIAGNOSIS — Z515 Encounter for palliative care: Secondary | ICD-10-CM

## 2022-03-11 DIAGNOSIS — R000 Tachycardia, unspecified: Secondary | ICD-10-CM | POA: Diagnosis not present

## 2022-03-11 DIAGNOSIS — I350 Nonrheumatic aortic (valve) stenosis: Secondary | ICD-10-CM | POA: Diagnosis not present

## 2022-03-11 LAB — CBC WITH DIFFERENTIAL/PLATELET
Abs Immature Granulocytes: 0.07 10*3/uL (ref 0.00–0.07)
Basophils Absolute: 0.1 10*3/uL (ref 0.0–0.1)
Basophils Relative: 0 %
Eosinophils Absolute: 0 10*3/uL (ref 0.0–0.5)
Eosinophils Relative: 0 %
HCT: 27.5 % — ABNORMAL LOW (ref 39.0–52.0)
Hemoglobin: 8.6 g/dL — ABNORMAL LOW (ref 13.0–17.0)
Immature Granulocytes: 0 %
Lymphocytes Relative: 96 %
Lymphs Abs: 17.3 10*3/uL — ABNORMAL HIGH (ref 0.7–4.0)
MCH: 29.5 pg (ref 26.0–34.0)
MCHC: 31.3 g/dL (ref 30.0–36.0)
MCV: 94.2 fL (ref 80.0–100.0)
Monocytes Absolute: 0.6 10*3/uL (ref 0.1–1.0)
Monocytes Relative: 3 %
Neutro Abs: 0.2 10*3/uL — CL (ref 1.7–7.7)
Neutrophils Relative %: 1 %
Platelets: 278 10*3/uL (ref 150–400)
RBC: 2.92 MIL/uL — ABNORMAL LOW (ref 4.22–5.81)
RDW: 16.8 % — ABNORMAL HIGH (ref 11.5–15.5)
WBC: 18 10*3/uL — ABNORMAL HIGH (ref 4.0–10.5)
nRBC: 0.2 % (ref 0.0–0.2)

## 2022-03-11 LAB — CULTURE, BLOOD (ROUTINE X 2)
Culture: NO GROWTH
Special Requests: ADEQUATE

## 2022-03-11 LAB — BASIC METABOLIC PANEL
Anion gap: 5 (ref 5–15)
BUN: 12 mg/dL (ref 8–23)
CO2: 21 mmol/L — ABNORMAL LOW (ref 22–32)
Calcium: 7.9 mg/dL — ABNORMAL LOW (ref 8.9–10.3)
Chloride: 111 mmol/L (ref 98–111)
Creatinine, Ser: 0.64 mg/dL (ref 0.61–1.24)
GFR, Estimated: >60 mL/min (ref >60)
Glucose, Bld: 86 mg/dL (ref 70–99)
Potassium: 3.1 mmol/L — ABNORMAL LOW (ref 3.5–5.1)
Sodium: 137 mmol/L (ref 135–145)

## 2022-03-11 MED ORDER — IMMUNE GLOBULIN (HUMAN) 20 GM/200ML IV SOLN
25.0000 g | Freq: Once | INTRAVENOUS | Status: AC
  Administered 2022-03-11: 25 g via INTRAVENOUS
  Filled 2022-03-11: qty 250

## 2022-03-11 MED ORDER — IMMUNE GLOBULIN (HUMAN) 20 GM/200ML IV SOLN
25.0000 g | Freq: Once | INTRAVENOUS | Status: DC
  Filled 2022-03-11: qty 400

## 2022-03-11 MED ORDER — SACCHAROMYCES BOULARDII 250 MG PO CAPS
250.0000 mg | ORAL_CAPSULE | Freq: Two times a day (BID) | ORAL | Status: DC
  Administered 2022-03-11 – 2022-03-15 (×9): 250 mg via ORAL
  Filled 2022-03-11 (×9): qty 1

## 2022-03-11 MED ORDER — LIDOCAINE 5 % EX PTCH
1.0000 | MEDICATED_PATCH | Freq: Every day | CUTANEOUS | Status: DC
  Administered 2022-03-11 – 2022-03-15 (×5): 1 via TRANSDERMAL
  Filled 2022-03-11 (×5): qty 1

## 2022-03-11 NOTE — Plan of Care (Signed)

## 2022-03-11 NOTE — Progress Notes (Signed)
PROGRESS NOTE    [facility]  OEH:[id number] DOB: 14-Nov-1950 DOA: 03/02/2022 PCP: Redmond School, MD   Brief Narrative:  71 y.o. male with medical history significant of CLL, stage IV lung cancer with bony metastases currently on chemotherapy and has received radiation, hypertension, hyperlipidemia, COPD presented with rectal pain and hematuria with increasing confusion.  On presentation, he was found to be neutropenic.  CT of abdomen and pelvis with contrast showed acute pancreatitis but no abscess or pseudocyst along with right iliac bone infiltrative lytic masses consistent with metastases.  CT of the head without contrast was negative for any acute intracranial abnormality.  He was started on IV fluids, antibiotics and Granix.  Assessment & Plan:   Pseudomonas bacteremia -Sensitivities demonstrating pansensitive microorganism Vancomycin has been discontinued. -Will repeat blood cultures today.  If persistently bacteremic, might have to consider removing of port. -She has remained afebrile.  Neutropenic fever -Presented with neutrophils of 0.  Currently on Granix as per Dr. Delton Coombes who recommended to continue Granix till [facility] is more than 1000.  Antibiotic plan as above.  Imaging study as above -ANC 0.2 currently. -Repeat CBC with differential in AM. -After further discussion with Dr. Delton Coombes decision has been made to treat with IVIG.  Stage IV lung cancer with bony metastasis/CLL -With cancer associated chronic pain -Follows up with Dr. Delton Coombes as an outpatient.  Has received radiation treatment recently.  Also undergoing chemotherapy.  Follow further recommendations from oncology. -Palliative care consultation appreciated; continue full code and full scope of practice. -Currently on scheduled OxyContin along with as needed oxycodone. -Expressing some pain in his back; see below for details as part of management.  Oral thrush -Complains of sore throat and has evidence of  oral thrush.  -Continue treatment with denies statin  Paroxysmal A-fib with RVR -Patient had episodes of A-fib with RVR on 03/03/2022 requiring oral and IV metoprolol.   -Rate currently controlled.  -Appreciate cardiology following and recommendations.   -Continue Eliquis for secondary prevention..  Elevated lipase -Imaging studies showed acute pancreatitis but clinically patient does not have any symptoms or signs of pancreatitis with benign abdominal appreciated on examination -Most likely acute phase reactant.  Failure to thrive/Severe malnutrition -Nutrition following.   -Continue Megace. -Patient advised to increase oral intake. -Patient's family has been instructed to bring food from outside if needed to increase oral intake and broader options.  Hypertension -Blood pressure on the lower side.  Continue metoprolol if blood pressure allows.  Hypokalemia -Continue repletion and follow electrolytes trend. -Potassium 2.6 today.  Hypomagnesemia -Replace.  Repeat a.m. labs  Hyperlipidemia -Continue statin -Continue to follow LFTs and lipid profile as an outpatient.  Depression -Continue bupropion -No suicidal ideation or hallucination.  Possible PE -Patient was empirically started on Eliquis during his last most recent hospitalization from 01/26/2022-01/30/2022 as CT angiogram could not rule out pulmonary embolism -Continue treatment with Eliquis -Good saturation on room air.  Physical deconditioning -PT recommends home health PT -Expressing feeling weak and tired. -Home health orders will be placed at time of discharge.  Dysuria/rectal pain -Some improvement reported after Foley catheter removed -Given the still having symptoms we will use empirical Anusol cream and PDN -Maintain adequate hydration and follow clinical response. -Florastor initiated for loose stools/diarrhea.  Upper back pain -Lidocaine patch has been ordered.  DVT prophylaxis: Eliquis Code  Status: Full Family Communication: Wife at bedside Disposition Plan: Status is: Inpatient Remains inpatient appropriate because: Of severity of illness   Consultants: Oncology/palliative care/cardiology Procedures:  None  Antimicrobials:  Anti-infectives (From admission, onward)    Start     Dose/Rate Route Frequency Ordered Stop   03/04/22 1400  ceFEPIme (MAXIPIME) 2 g in sodium chloride 0.9 % 100 mL IVPB        2 g 200 mL/hr over 30 Minutes Intravenous Every 8 hours 03/04/22 0807     03/03/22 2300  vancomycin (VANCOREADY) IVPB 1250 mg/250 mL  Status:  Discontinued        1,250 mg 166.7 mL/hr over 90 Minutes Intravenous Every 24 hours 03/02/22 2148 03/04/22 0755   03/03/22 1000  valACYclovir (VALTREX) tablet 500 mg        500 mg Oral Daily 03/02/22 2220     03/03/22 0600  ceFEPIme (MAXIPIME) 2 g in sodium chloride 0.9 % 100 mL IVPB  Status:  Discontinued        2 g 200 mL/hr over 30 Minutes Intravenous Every 12 hours 03/02/22 2148 03/02/22 2149   03/03/22 0600  ceFEPIme (MAXIPIME) 2 g in sodium chloride 0.9 % 100 mL IVPB  Status:  Discontinued        2 g 200 mL/hr over 30 Minutes Intravenous Every 12 hours 03/02/22 2149 03/04/22 0807   03/02/22 2200  vancomycin (VANCOREADY) IVPB 1500 mg/300 mL        1,500 mg 150 mL/hr over 120 Minutes Intravenous  Once 03/02/22 2135 03/03/22 0043   03/02/22 1815  ceFEPIme (MAXIPIME) 2 g in sodium chloride 0.9 % 100 mL IVPB        2 g 200 mL/hr over 30 Minutes Intravenous  Once 03/02/22 1800 03/02/22 1908   03/02/22 1815  metroNIDAZOLE (FLAGYL) IVPB 500 mg        500 mg 100 mL/hr over 60 Minutes Intravenous  Once 03/02/22 1800 03/02/22 2016      Subjective: Afebrile; no chest pain, no nausea, no vomiting.  Complaining of intermittent episode of loose stools/diarrhea and back pain.   Objective: Vitals:   03/10/22 0439 03/10/22 1359 03/10/22 2119 03/11/22 0537  BP: (!) 112/53 117/70 116/65 103/75  Pulse: 97 92 94 93  Resp: 16 18 18 18    Temp: 98.3 F (36.8 C) 98.2 F (36.8 C) 97.8 F (36.6 C) 97.9 F (36.6 C)  TempSrc: Oral Oral  Oral  SpO2: 99% 99% 99% 96%  Weight: 65 kg   64.5 kg  Height:        Intake/Output Summary (Last 24 hours) at 03/11/2022 1238 Last data filed at 03/11/2022 0930 Gross per 24 hour  Intake 480 ml  Output 2300 ml  Net -1820 ml   Filed Weights   03/09/22 0615 03/10/22 0439 03/11/22 0537  Weight: 66 kg 65 kg 64.5 kg    Examination: General exam: Alert, awake, oriented x 3; afebrile, no chest pain, no nausea, no vomiting.  Continues to demonstrate poor oral intake.  Chronically ill in appearance.  Expressing episodes of diarrhea and complaining of upper back pain. Respiratory system: Clear to auscultation. Respiratory effort normal.  No using accessory muscle.  Good saturation on room air. Cardiovascular system: Rate controlled, no rubs, no gallops, no JVD. Gastrointestinal system: Abdomen is nondistended, soft and nontender. No organomegaly or masses felt. Normal bowel sounds heard. Central nervous system: Alert and oriented. No focal neurological deficits. Extremities: No cyanosis or clubbing; no edema. Skin: No petechiae. Psychiatry: Judgement and insight appear normal. Mood & affect appropriate.   Data Reviewed: I have personally reviewed following labs and imaging studies  CBC: Recent Labs  Lab 03/07/22 1202 03/09/22 0405 03/10/22 0612 03/10/22 1703 03/11/22 0402  WBC 28.8* 14.3* 14.5* 23.9* 18.0*  NEUTROABS 0.1* 0.1* 0.2* 0.2* 0.2*  HGB 9.5* 8.5* 8.2* 8.9* 8.6*  HCT 30.3* 26.8* 26.6* 28.5* 27.5*  MCV 94.4 94.7 94.3 95.0 94.2  PLT 293 252 247 308 272   Basic Metabolic Panel: Recent Labs  Lab 03/05/22 0631 03/06/22 0505 03/07/22 1202 03/08/22 0503 03/09/22 0405 03/09/22 1244 03/10/22 0612 03/11/22 0402  NA 136 134* 133* 133* 136  --  134* 137  K 3.1* 2.9* 3.6 2.9* 3.5  --  3.3* 3.1*  CL 111 109 107 103 112*  --  110 111  CO2 19* 20* 20* 22 20*  --  21* 21*   GLUCOSE 83 81 94 86 85  --  94 86  BUN 13 9 8  7* 6*  --  10 12  CREATININE 0.49* 0.53* 0.63 0.53* 0.54*  --  0.60* 0.64  CALCIUM 8.1* 7.7* 8.4* 7.5* 7.7*  --  7.7* 7.9*  MG 1.3* 1.5* 1.4*  --  1.6* 1.6*  --   --   PHOS  --   --   --   --  2.6  --   --   --    GFR: Estimated Creatinine Clearance: 78.4 mL/min (by C-G formula based on SCr of 0.64 mg/dL).  Liver Function Tests: Recent Labs  Lab 03/06/22 0505  AST 16  ALT 18  ALKPHOS 44  BILITOT 0.6  PROT 4.1*  ALBUMIN 1.9*   Recent Results (from the past 240 hour(s))  Blood culture (routine x 2)     Status: Abnormal   Collection Time: 03/02/22  2:01 PM   Specimen: Right Antecubital; Blood  Result Value Ref Range Status   Specimen Description   Final    RIGHT ANTECUBITAL BOTTLES DRAWN AEROBIC AND ANAEROBIC Performed at [facility], [address] [place], [place] [address]    Special Requests   Final    Blood Culture results may not be optimal due to an excessive volume of blood received in culture bottles Performed at [facility], [address] [place], [place] [address]    Culture  Setup Time   Final    GRAM NEGATIVE RODS AEROBIC BOTTLE ONLY Gram Stain Report Called to,Read Back By and Verified With:  T. ISLEY @ 1217 BY STEPHTR 03/03/22 Performed at [facility], [address] [place], [place] [address]    Culture (A)  Final    PSEUDOMONAS AERUGINOSA SUSCEPTIBILITIES PERFORMED ON PREVIOUS CULTURE WITHIN THE LAST 5 DAYS. Performed at [facility], [place] [address], [place], [place] [address]    Report Status 03/05/2022 FINAL  Final  Blood culture (routine x 2)     Status: Abnormal   Collection Time: 03/02/22  2:06 PM   Specimen: Left Antecubital; Blood  Result Value Ref Range Status   Specimen Description   Final    LEFT ANTECUBITAL BOTTLES DRAWN AEROBIC AND ANAEROBIC Performed at [facility], [address], [place], [place] [address]    Special Requests   Final    Blood Culture results may not be optimal  due to an excessive volume of blood received in culture bottles Performed at [facility], [address], [place], [place] [address]    Culture  Setup Time   Final    GRAM NEGATIVE RODS Gram Stain Report Called to,Read Back By and Verified With:  T.ISLEY @ 4166 BY STEPHTR 03/03/22 AEROBIC BOTTLE ONLY Organism ID  to follow CRITICAL RESULT CALLED TO, READ BACK BY AND VERIFIED WITH:  Alberteen Sam, RN 03/03/22 1619 A. LAFRANCE Performed at [facility], [place] [address], [place], [place] [address]    Culture PSEUDOMONAS AERUGINOSA (A)  Final   Report Status 03/05/2022 FINAL  Final   Organism ID, Bacteria PSEUDOMONAS AERUGINOSA  Final      Susceptibility   Pseudomonas aeruginosa - MIC*    CEFTAZIDIME 2 SENSITIVE Sensitive     CIPROFLOXACIN <=0.25 SENSITIVE Sensitive     GENTAMICIN <=1 SENSITIVE Sensitive     IMIPENEM 2 SENSITIVE Sensitive     PIP/TAZO <=4 SENSITIVE Sensitive     CEFEPIME 2 SENSITIVE Sensitive     * PSEUDOMONAS AERUGINOSA  Blood Culture ID Panel (Reflexed)     Status: Abnormal   Collection Time: 03/02/22  2:06 PM  Result Value Ref Range Status   Enterococcus faecalis NOT DETECTED NOT DETECTED Final   Enterococcus Faecium NOT DETECTED NOT DETECTED Final   Listeria monocytogenes NOT DETECTED NOT DETECTED Final   Staphylococcus species NOT DETECTED NOT DETECTED Final   Staphylococcus aureus (BCID) NOT DETECTED NOT DETECTED Final   Staphylococcus epidermidis NOT DETECTED NOT DETECTED Final   Staphylococcus lugdunensis NOT DETECTED NOT DETECTED Final   Streptococcus species NOT DETECTED NOT DETECTED Final   Streptococcus agalactiae NOT DETECTED NOT DETECTED Final   Streptococcus pneumoniae NOT DETECTED NOT DETECTED Final   Streptococcus pyogenes NOT DETECTED NOT DETECTED Final   A.calcoaceticus-baumannii NOT DETECTED NOT DETECTED Final   Bacteroides fragilis NOT DETECTED NOT DETECTED Final   Enterobacterales NOT DETECTED NOT DETECTED Final   Enterobacter cloacae  complex NOT DETECTED NOT DETECTED Final   Escherichia coli NOT DETECTED NOT DETECTED Final   Klebsiella aerogenes NOT DETECTED NOT DETECTED Final   Klebsiella oxytoca NOT DETECTED NOT DETECTED Final   Klebsiella pneumoniae NOT DETECTED NOT DETECTED Final   Proteus species NOT DETECTED NOT DETECTED Final   Salmonella species NOT DETECTED NOT DETECTED Final   Serratia marcescens NOT DETECTED NOT DETECTED Final   Haemophilus influenzae NOT DETECTED NOT DETECTED Final   Neisseria meningitidis NOT DETECTED NOT DETECTED Final   Pseudomonas aeruginosa DETECTED (A) NOT DETECTED Final    Comment: CRITICAL RESULT CALLED TO, READ BACK BY AND VERIFIED WITH:  Alberteen Sam, RN 03/03/22 1619 A. LAFRANCE     Stenotrophomonas maltophilia NOT DETECTED NOT DETECTED Final   Candida albicans NOT DETECTED NOT DETECTED Final   Candida auris NOT DETECTED NOT DETECTED Final   Candida glabrata NOT DETECTED NOT DETECTED Final   Candida krusei NOT DETECTED NOT DETECTED Final   Candida parapsilosis NOT DETECTED NOT DETECTED Final   Candida tropicalis NOT DETECTED NOT DETECTED Final   Cryptococcus neoformans/gattii NOT DETECTED NOT DETECTED Final   CTX-M ESBL NOT DETECTED NOT DETECTED Final   Carbapenem resistance IMP NOT DETECTED NOT DETECTED Final   Carbapenem resistance KPC NOT DETECTED NOT DETECTED Final   Carbapenem resistance NDM NOT DETECTED NOT DETECTED Final   Carbapenem resistance VIM NOT DETECTED NOT DETECTED Final    Comment: Performed at [facility], [address] [place], [place] [address]  Resp Panel by RT-PCR (Flu A&B, Covid) Anterior Nasal Swab     Status: None   Collection Time: 03/02/22  3:08 PM   Specimen: Anterior Nasal Swab  Result Value Ref Range Status   SARS Coronavirus 2 by RT PCR NEGATIVE NEGATIVE Final    Comment: (NOTE) SARS-CoV-2 target nucleic acids are NOT  DETECTED.  The SARS-CoV-2 RNA is generally detectable in upper respiratory specimens during the acute phase of  infection. The lowest concentration of SARS-CoV-2 viral copies this assay can detect is 138 copies/mL. A negative result does not preclude SARS-Cov-2 infection and should not be used as the sole basis for treatment or other patient management decisions. A negative result may occur with  improper specimen collection/handling, submission of specimen other than nasopharyngeal swab, presence of viral mutation(s) within the areas targeted by this assay, and inadequate number of viral copies(<138 copies/mL). A negative result must be combined with clinical observations, patient history, and epidemiological information. The expected result is Negative.  Fact Sheet for Patients:  EntrepreneurPulse.com.au  Fact Sheet for Healthcare Providers:  IncredibleEmployment.be  This test is no t yet approved or cleared by the [place] FDA and  has been authorized for detection and/or diagnosis of SARS-CoV-2 by FDA under an Emergency Use Authorization (EUA). This EUA will remain  in effect (meaning this test can be used) for the duration of the COVID-19 declaration under Section 564(b)(1) of the Act, 21 U.S.C.section 360bbb-3(b)(1), unless the authorization is terminated  or revoked sooner.       Influenza A by PCR NEGATIVE NEGATIVE Final   Influenza B by PCR NEGATIVE NEGATIVE Final    Comment: (NOTE) The Xpert Xpress SARS-CoV-2/FLU/RSV plus assay is intended as an aid in the diagnosis of influenza from Nasopharyngeal swab specimens and should not be used as a sole basis for treatment. Nasal washings and aspirates are unacceptable for Xpert Xpress SARS-CoV-2/FLU/RSV testing.  Fact Sheet for Patients: EntrepreneurPulse.com.au  Fact Sheet for Healthcare Providers: IncredibleEmployment.be  This test is not yet approved or cleared by the [place] FDA and has been authorized for detection and/or diagnosis of SARS-CoV-2  by FDA under an Emergency Use Authorization (EUA). This EUA will remain in effect (meaning this test can be used) for the duration of the COVID-19 declaration under Section 564(b)(1) of the Act, 21 U.S.C. section 360bbb-3(b)(1), unless the authorization is terminated or revoked.  Performed at [facility], [address] [place], [place] [address]   Urine Culture     Status: None   Collection Time: 03/02/22  7:03 PM   Specimen: Urine, Clean Catch  Result Value Ref Range Status   Specimen Description   Final    URINE, CLEAN CATCH Performed at [facility], [address], [place], [place] [address]    Special Requests   Final    NONE Performed at [facility], [address], [place], [place] [address]    Culture   Final    NO GROWTH Performed at [facility], [place] [address], [place], [place] [address]    Report Status 03/04/2022 FINAL  Final  Culture, blood (Routine X 2) w Reflex to ID Panel     Status: None   Collection Time: 03/06/22  1:48 PM   Specimen: BLOOD RIGHT HAND  Result Value Ref Range Status   Specimen Description BLOOD RIGHT HAND  Final   Special Requests   Final    BOTTLES DRAWN AEROBIC AND ANAEROBIC Blood Culture adequate volume   Culture   Final    NO GROWTH 5 DAYS Performed at [facility], [address], [place], [place] [address]    Report Status 03/11/2022 FINAL  Final     Radiology Studies: No results found.  Scheduled Meds:  (feeding supplement) PROSource Plus  30 mL Oral TID BM   apixaban  5 mg Oral  BID   buPROPion  150 mg Oral Daily   Chlorhexidine Gluconate Cloth  6 each Topical Daily   feeding supplement  237 mL Oral TID BM   hydrocortisone   Rectal BID   lidocaine  1 patch Transdermal Daily   megestrol  400 mg Oral BID   metoprolol tartrate  50 mg Oral BID   midodrine  5 mg Oral TID WC   nystatin  5 mL Oral QID   oxyCODONE  20 mg Oral Q12H   pravastatin  20 mg Oral q1800   senna-docusate  2 tablet Oral BID   Tbo-filgastrim  (GRANIX) SQ  480 mcg Subcutaneous q1800   valACYclovir  500 mg Oral Daily   Continuous Infusions:  sodium chloride 75 mL/hr at 03/09/22 1338   ceFEPime (MAXIPIME) IV 2 g (03/11/22 0964)    Barton Dubois, MD [facility] 03/11/2022, 12:38 PM

## 2022-03-11 NOTE — Progress Notes (Signed)
     Referral previously received for [facility] for goals of care discussion. Noted most recent palliative in-person assessment dated 03/05/22 at which time it was recommended to follow from a distance/chart check.   Chart reviewed for Recent provider notes, nurse notes, TOC notes, vitals, labs, and imaging and updates received from RN.   At this time patient appears stable. No plan for in person follow-up at this time. Please contact the palliative medicine provider on service for any new/urgent needs that require our assistance with this patient.  Thank you for your referral and allowing PMT to assist in [name] care.   Alfred Field, NP Palliative Medicine Team Phone: [phone]  NO CHARGE

## 2022-03-12 DIAGNOSIS — J449 Chronic obstructive pulmonary disease, unspecified: Secondary | ICD-10-CM | POA: Diagnosis not present

## 2022-03-12 DIAGNOSIS — I471 Supraventricular tachycardia, unspecified: Secondary | ICD-10-CM | POA: Diagnosis not present

## 2022-03-12 DIAGNOSIS — D709 Neutropenia, unspecified: Secondary | ICD-10-CM | POA: Diagnosis not present

## 2022-03-12 DIAGNOSIS — R9431 Abnormal electrocardiogram [ECG] [EKG]: Secondary | ICD-10-CM

## 2022-03-12 DIAGNOSIS — C3492 Malignant neoplasm of unspecified part of left bronchus or lung: Secondary | ICD-10-CM | POA: Diagnosis not present

## 2022-03-12 LAB — CBC WITH DIFFERENTIAL/PLATELET
Abs Immature Granulocytes: 1 10*3/uL — ABNORMAL HIGH (ref 0.00–0.07)
Basophils Absolute: 0 10*3/uL (ref 0.0–0.1)
Basophils Relative: 0 %
Eosinophils Absolute: 0 10*3/uL (ref 0.0–0.5)
Eosinophils Relative: 0 %
HCT: 27.8 % — ABNORMAL LOW (ref 39.0–52.0)
Hemoglobin: 8.7 g/dL — ABNORMAL LOW (ref 13.0–17.0)
Lymphocytes Relative: 84 %
Lymphs Abs: 17.6 10*3/uL — ABNORMAL HIGH (ref 0.7–4.0)
MCH: 29.2 pg (ref 26.0–34.0)
MCHC: 31.3 g/dL (ref 30.0–36.0)
MCV: 93.3 fL (ref 80.0–100.0)
Metamyelocytes Relative: 3 %
Monocytes Absolute: 1.5 10*3/uL — ABNORMAL HIGH (ref 0.1–1.0)
Monocytes Relative: 7 %
Neutro Abs: 0.8 10*3/uL — ABNORMAL LOW (ref 1.7–7.7)
Neutrophils Relative %: 4 %
Platelets: 292 10*3/uL (ref 150–400)
Promyelocytes Relative: 2 %
RBC: 2.98 MIL/uL — ABNORMAL LOW (ref 4.22–5.81)
RDW: 17 % — ABNORMAL HIGH (ref 11.5–15.5)
WBC: 20.9 10*3/uL — ABNORMAL HIGH (ref 4.0–10.5)
nRBC: 0.2 % (ref 0.0–0.2)

## 2022-03-12 MED ORDER — POTASSIUM CHLORIDE CRYS ER 20 MEQ PO TBCR
40.0000 meq | EXTENDED_RELEASE_TABLET | Freq: Every day | ORAL | Status: DC
  Administered 2022-03-12 – 2022-03-13 (×2): 40 meq via ORAL
  Filled 2022-03-12 (×3): qty 2

## 2022-03-12 MED ORDER — PHENAZOPYRIDINE HCL 100 MG PO TABS
100.0000 mg | ORAL_TABLET | Freq: Three times a day (TID) | ORAL | Status: AC
  Administered 2022-03-12 – 2022-03-14 (×6): 100 mg via ORAL
  Filled 2022-03-12 (×6): qty 1

## 2022-03-12 NOTE — Progress Notes (Signed)
PROGRESS NOTE    [facility] Brown [facility]  FAO:[id number] DOB: [birth year] DOA: 03/02/2022 PCP: Redmond School, MD   Brief Narrative:  71 y.o. male with medical history significant of CLL, stage IV lung cancer with bony metastases currently on chemotherapy and has received radiation, hypertension, hyperlipidemia, COPD presented with rectal pain and hematuria with increasing confusion.  On presentation, he was found to be neutropenic.  CT of abdomen and pelvis with contrast showed acute pancreatitis but no abscess or pseudocyst along with right iliac bone infiltrative lytic masses consistent with metastases.  CT of the head without contrast was negative for any acute intracranial abnormality.  He was started on IV fluids, antibiotics and Granix.  Assessment & Plan:   Pseudomonas bacteremia -Sensitivities demonstrating pansensitive microorganism Vancomycin has been discontinued. -repeat blood cx's neg; not need to remove port. -patient has remained afebrile.  Neutropenic fever -Presented with neutrophils of 0.  Currently on Granix as per Dr. Delton Coombes who recommended to continue Granix till [facility] is more than 1000.  Antibiotic plan as above.  Imaging study as above -ANC 0.8 currently. -Repeat CBC with differential in AM. -s/p IVIG X1; will follow further rec's by Dr. Tera Helper.  Stage IV lung cancer with bony metastasis/CLL -With cancer associated chronic pain -Follows up with Dr. Delton Coombes as an outpatient.  Has received radiation treatment recently.  Also undergoing chemotherapy.  Follow further recommendations from oncology. -Palliative care consultation appreciated; continue full code and full scope of practice. -Currently on scheduled OxyContin along with as needed oxycodone. -Expressing some pain in his back; see below for details as part of management.  Oral thrush -Complains of sore throat and has evidence of oral thrush.  -Continue treatment with denies statin  Paroxysmal A-fib with  RVR -Patient had episodes of A-fib with RVR on 03/03/2022 requiring oral and IV metoprolol.   -Rate currently controlled.  -Appreciate cardiology following and recommendations.   -Continue Eliquis for secondary prevention..  Elevated lipase -Imaging studies showed acute pancreatitis but clinically patient does not have any symptoms or signs of pancreatitis with benign abdominal appreciated on examination -Most likely acute phase reactant.  Failure to thrive/Severe malnutrition -Nutrition following.   -Continue Megace. -Patient advised to increase oral intake. -Patient's family has been instructed to bring food from outside if needed to increase oral intake and broader options.  Hypertension -Blood pressure on the lower side.  Continue metoprolol if blood pressure allows.  Hypokalemia -Potassium 3.1 -will start daily supplementation and continue to follow trend.   Hypomagnesemia -Replace.  Repeat a.m. labs  Hyperlipidemia -Continue statin -Continue to follow LFTs and lipid profile as an outpatient.  Depression -Continue bupropion -No suicidal ideation or hallucination.  Possible PE -Patient was empirically started on Eliquis during his last most recent hospitalization from 01/26/2022-01/30/2022 as CT angiogram could not rule out pulmonary embolism -Continue treatment with Eliquis -Good saturation on room air.  Physical deconditioning -PT recommends home health PT -Expressing feeling weak and tired. -Home health orders will be placed at time of discharge.  Dysuria/rectal pain -Some improvement reported after Foley catheter removed -Given the still having symptoms we will use empirical Anusol cream and Pyridium  -Maintain adequate hydration and follow clinical response. -Florastor initiated for loose stools/diarrhea.  Upper back pain -Lidocaine patch has been ordered.  DVT prophylaxis: Eliquis Code Status: Full Family Communication: Wife at bedside Disposition  Plan: Status is: Inpatient Remains inpatient appropriate because: Of severity of illness   Consultants: Oncology/palliative care/cardiology Procedures: None  Antimicrobials:  Anti-infectives (From admission,  onward)    Start     Dose/Rate Route Frequency Ordered Stop   03/04/22 1400  ceFEPIme (MAXIPIME) 2 g in sodium chloride 0.9 % 100 mL IVPB        2 g 200 mL/hr over 30 Minutes Intravenous Every 8 hours 03/04/22 0807     03/03/22 2300  vancomycin (VANCOREADY) IVPB 1250 mg/250 mL  Status:  Discontinued        1,250 mg 166.7 mL/hr over 90 Minutes Intravenous Every 24 hours 03/02/22 2148 03/04/22 0755   03/03/22 1000  valACYclovir (VALTREX) tablet 500 mg        500 mg Oral Daily 03/02/22 2220     03/03/22 0600  ceFEPIme (MAXIPIME) 2 g in sodium chloride 0.9 % 100 mL IVPB  Status:  Discontinued        2 g 200 mL/hr over 30 Minutes Intravenous Every 12 hours 03/02/22 2148 03/02/22 2149   03/03/22 0600  ceFEPIme (MAXIPIME) 2 g in sodium chloride 0.9 % 100 mL IVPB  Status:  Discontinued        2 g 200 mL/hr over 30 Minutes Intravenous Every 12 hours 03/02/22 2149 03/04/22 0807   03/02/22 2200  vancomycin (VANCOREADY) IVPB 1500 mg/300 mL        1,500 mg 150 mL/hr over 120 Minutes Intravenous  Once 03/02/22 2135 03/03/22 0043   03/02/22 1815  ceFEPIme (MAXIPIME) 2 g in sodium chloride 0.9 % 100 mL IVPB        2 g 200 mL/hr over 30 Minutes Intravenous  Once 03/02/22 1800 03/02/22 1908   03/02/22 1815  metroNIDAZOLE (FLAGYL) IVPB 500 mg        500 mg 100 mL/hr over 60 Minutes Intravenous  Once 03/02/22 1800 03/02/22 2016      Subjective: No fever, no chest pain, no vomiting.  Reports some mild nausea in the morning.  Patient continued to experience intermittent episode of urgency and dysuria.   Objective: Vitals:   03/11/22 2203 03/12/22 0338 03/12/22 0957 03/12/22 1347  BP: 114/72 111/72 (!) 111/53 112/66  Pulse: 94 85 98 90  Resp: 16 15  15   Temp: (!) 96.9 F (36.1 C) 97.8 F  (36.6 C)  98.5 F (36.9 C)  TempSrc:  Oral    SpO2: 98% 98%  100%  Weight:  71.2 kg    Height:        Intake/Output Summary (Last 24 hours) at 03/12/2022 1504 Last data filed at 03/12/2022 1300 Gross per 24 hour  Intake 1683.73 ml  Output 3102 ml  Net -1418.27 ml   Filed Weights   03/10/22 0439 03/11/22 0537 03/12/22 0338  Weight: 65 kg 64.5 kg 71.2 kg    Examination: General exam: Alert, awake, oriented x 3; feeling better; mild nausea in the morning but no vomiting.  Still with decreased oral intake as per family report.  No fever. Respiratory system: Clear to auscultation. Respiratory effort normal.  Good saturation on room air. Cardiovascular system:RRR. No rubs or gallops; no JVD. Gastrointestinal system: Abdomen is nondistended, soft and nontender. No organomegaly or masses felt. Normal bowel sounds heard. Central nervous system: Alert and oriented. No focal neurological deficits. Extremities: No cyanosis, clubbing or edema. Skin: No petechiae. Psychiatry: Judgement and insight appear normal. Mood & affect appropriate.   Data Reviewed: I have personally reviewed following labs and imaging studies  CBC: Recent Labs  Lab 03/09/22 0405 03/10/22 0612 03/10/22 1703 03/11/22 0402 03/12/22 0333  WBC 14.3* 14.5* 23.9* 18.0* 20.9*  NEUTROABS 0.1* 0.2* 0.2* 0.2* 0.8*  HGB 8.5* 8.2* 8.9* 8.6* 8.7*  HCT 26.8* 26.6* 28.5* 27.5* 27.8*  MCV 94.7 94.3 95.0 94.2 93.3  PLT 252 247 308 278 542   Basic Metabolic Panel: Recent Labs  Lab 03/06/22 0505 03/07/22 1202 03/08/22 0503 03/09/22 0405 03/09/22 1244 03/10/22 0612 03/11/22 0402  NA 134* 133* 133* 136  --  134* 137  K 2.9* 3.6 2.9* 3.5  --  3.3* 3.1*  CL 109 107 103 112*  --  110 111  CO2 20* 20* 22 20*  --  21* 21*  GLUCOSE 81 94 86 85  --  94 86  BUN 9 8 7* 6*  --  10 12  CREATININE 0.53* 0.63 0.53* 0.54*  --  0.60* 0.64  CALCIUM 7.7* 8.4* 7.5* 7.7*  --  7.7* 7.9*  MG 1.5* 1.4*  --  1.6* 1.6*  --   --   PHOS   --   --   --  2.6  --   --   --    GFR: Estimated Creatinine Clearance: 86.5 mL/min (by C-G formula based on SCr of 0.64 mg/dL).  Liver Function Tests: Recent Labs  Lab 03/06/22 0505  AST 16  ALT 18  ALKPHOS 44  BILITOT 0.6  PROT 4.1*  ALBUMIN 1.9*   Recent Results (from the past 240 hour(s))  Resp Panel by RT-PCR (Flu A&B, Covid) Anterior Nasal Swab     Status: None   Collection Time: 03/02/22  3:08 PM   Specimen: Anterior Nasal Swab  Result Value Ref Range Status   SARS Coronavirus 2 by RT PCR NEGATIVE NEGATIVE Final    Comment: (NOTE) SARS-CoV-2 target nucleic acids are NOT DETECTED.  The SARS-CoV-2 RNA is generally detectable in upper respiratory specimens during the acute phase of infection. The lowest concentration of SARS-CoV-2 viral copies this assay can detect is 138 copies/mL. A negative result does not preclude SARS-Cov-2 infection and should not be used as the sole basis for treatment or other patient management decisions. A negative result may occur with  improper specimen collection/handling, submission of specimen other than nasopharyngeal swab, presence of viral mutation(s) within the areas targeted by this assay, and inadequate number of viral copies(<138 copies/mL). A negative result must be combined with clinical observations, patient history, and epidemiological information. The expected result is Negative.  Fact Sheet for Patients:  EntrepreneurPulse.com.au  Fact Sheet for Healthcare Providers:  IncredibleEmployment.be  This test is no t yet approved or cleared by the [place] FDA and  has been authorized for detection and/or diagnosis of SARS-CoV-2 by FDA under an Emergency Use Authorization (EUA). This EUA will remain  in effect (meaning this test can be used) for the duration of the COVID-19 declaration under Section 564(b)(1) of the Act, 21 U.S.C.section 360bbb-3(b)(1), unless the authorization is  terminated  or revoked sooner.       Influenza A by PCR NEGATIVE NEGATIVE Final   Influenza B by PCR NEGATIVE NEGATIVE Final    Comment: (NOTE) The Xpert Xpress SARS-CoV-2/FLU/RSV plus assay is intended as an aid in the diagnosis of influenza from Nasopharyngeal swab specimens and should not be used as a sole basis for treatment. Nasal washings and aspirates are unacceptable for Xpert Xpress SARS-CoV-2/FLU/RSV testing.  Fact Sheet for Patients: EntrepreneurPulse.com.au  Fact Sheet for Healthcare Providers: IncredibleEmployment.be  This test is not yet approved or cleared by the [place] FDA and has been authorized for detection and/or diagnosis of SARS-CoV-2 by  FDA under an Emergency Use Authorization (EUA). This EUA will remain in effect (meaning this test can be used) for the duration of the COVID-19 declaration under Section 564(b)(1) of the Act, 21 U.S.C. section 360bbb-3(b)(1), unless the authorization is terminated or revoked.  Performed at [facility], [address] [place], [place] [address]   Urine Culture     Status: None   Collection Time: 03/02/22  7:03 PM   Specimen: Urine, Clean Catch  Result Value Ref Range Status   Specimen Description   Final    URINE, CLEAN CATCH Performed at [facility], [address] [place], [place] [address]    Special Requests   Final    NONE Performed at [facility], [address], [place], [place] [address]    Culture   Final    NO GROWTH Performed at [facility], [place] [address] [place], [place] [address]    Report Status 03/04/2022 FINAL  Final  Culture, blood (Routine X 2) w Reflex to ID Panel     Status: None   Collection Time: 03/06/22  1:48 PM   Specimen: BLOOD RIGHT HAND  Result Value Ref Range Status   Specimen Description BLOOD RIGHT HAND  Final   Special Requests   Final    BOTTLES DRAWN AEROBIC AND ANAEROBIC Blood Culture adequate volume   Culture   Final     NO GROWTH 5 DAYS Performed at [facility], [address] [place], [place] [address]    Report Status 03/11/2022 FINAL  Final     Radiology Studies: No results found.  Scheduled Meds:  (feeding supplement) PROSource Plus  30 mL Oral TID BM   apixaban  5 mg Oral BID   buPROPion  150 mg Oral Daily   Chlorhexidine Gluconate Cloth  6 each Topical Daily   feeding supplement  237 mL Oral TID BM   hydrocortisone   Rectal BID   lidocaine  1 patch Transdermal Daily   megestrol  400 mg Oral BID   metoprolol tartrate  50 mg Oral BID   midodrine  5 mg Oral TID WC   nystatin  5 mL Oral QID   oxyCODONE  20 mg Oral Q12H   pravastatin  20 mg Oral q1800   saccharomyces boulardii  250 mg Oral BID   senna-docusate  2 tablet Oral BID   Tbo-filgastrim (GRANIX) SQ  480 mcg Subcutaneous q1800   valACYclovir  500 mg Oral Daily   Continuous Infusions:  sodium chloride 75 mL/hr at 03/12/22 1341   ceFEPime (MAXIPIME) IV 2 g (03/12/22 1344)    Barton Dubois, MD [facility] 03/12/2022, 3:04 PM

## 2022-03-12 NOTE — Progress Notes (Addendum)
     Referral previously received for Alfred Brown for goals of care discussion. Noted most recent palliative in-person assessment dated 03/11/22 at which time it was recommended to follow from a distance/chart check unless substantial decline noted..   Chart reviewed for Recent provider notes, nurse notes, TOC notes, vitals, labs, and imaging and updates received from RN.   At this time patient appears stable, vital signs normal..  CBC today shows increasing leukocytosis from 18.0 yesterday to 20.9 today.  Hemoglobin stable. No new imaging.  No update from hospitalist or nursing as of yet today.  No noted substantial decline at this time. No plan for in person follow-up at this time given patient desire for no engagement with palliative medicine previously. Please contact the palliative medicine provider on service for any new/urgent needs that require our assistance with this patient.  Thank you for your referral and allowing PM[facility] to assist in [place] care.   Walden Field, NP Palliative Medicine Team Phone: [phone]  NO CHARGE

## 2022-03-13 DIAGNOSIS — D709 Neutropenia, unspecified: Secondary | ICD-10-CM | POA: Diagnosis not present

## 2022-03-13 DIAGNOSIS — R5081 Fever presenting with conditions classified elsewhere: Secondary | ICD-10-CM | POA: Diagnosis not present

## 2022-03-13 LAB — BASIC METABOLIC PANEL
Anion gap: 6 (ref 5–15)
BUN: 11 mg/dL (ref 8–23)
CO2: 20 mmol/L — ABNORMAL LOW (ref 22–32)
Calcium: 8.2 mg/dL — ABNORMAL LOW (ref 8.9–10.3)
Chloride: 109 mmol/L (ref 98–111)
Creatinine, Ser: 0.64 mg/dL (ref 0.61–1.24)
GFR, Estimated: >60 mL/min (ref >60)
Glucose, Bld: 76 mg/dL (ref 70–99)
Potassium: 3.2 mmol/L — ABNORMAL LOW (ref 3.5–5.1)
Sodium: 135 mmol/L (ref 135–145)

## 2022-03-13 LAB — MAGNESIUM: Magnesium: 1.4 mg/dL — ABNORMAL LOW (ref 1.7–2.4)

## 2022-03-13 LAB — CBC WITH DIFFERENTIAL/PLATELET
Basophils Absolute: 0 10*3/uL (ref 0.0–0.1)
Basophils Relative: 0 %
Basophils Relative: 0 %
Eosinophils Absolute: 0 10*3/uL (ref 0.0–0.5)
Eosinophils Relative: 1 %
Eosinophils Relative: 0 %
HCT: 30.3 % — ABNORMAL LOW (ref 39.0–52.0)
HCT: 29.3 % — ABNORMAL LOW (ref 39.0–52.0)
Hemoglobin: 9.5 g/dL — ABNORMAL LOW (ref 13.0–17.0)
Hemoglobin: 9.2 g/dL — ABNORMAL LOW (ref 13.0–17.0)
Lymphocytes Relative: 96 %
Lymphocytes Relative: 73 %
Lymphs Abs: 28.1 10*3/uL — ABNORMAL HIGH (ref 0.7–4.0)
MCH: 29.6 pg (ref 26.0–34.0)
MCH: 29.1 pg (ref 26.0–34.0)
MCHC: 31.4 g/dL (ref 30.0–36.0)
MCHC: 31.4 g/dL (ref 30.0–36.0)
MCV: 94.4 fL (ref 80.0–100.0)
MCV: 92.7 fL (ref 80.0–100.0)
Metamyelocytes Relative: 1 %
Monocytes Absolute: 0.5 10*3/uL (ref 0.1–1.0)
Monocytes Relative: 2 %
Monocytes Relative: 9 %
Myelocytes: 1 %
Neutro Abs: 0.1 10*3/uL — CL (ref 1.7–7.7)
Neutrophils Relative %: 1 %
Neutrophils Relative %: 13 %
Platelets: 293 10*3/uL (ref 150–400)
Platelets: 304 10*3/uL (ref 150–400)
Promyelocytes Relative: 3 %
RBC: 3.21 MIL/uL — ABNORMAL LOW (ref 4.22–5.81)
RBC: 3.16 MIL/uL — ABNORMAL LOW (ref 4.22–5.81)
RDW: 16.7 % — ABNORMAL HIGH (ref 11.5–15.5)
RDW: 16.8 % — ABNORMAL HIGH (ref 11.5–15.5)
WBC: 28.8 10*3/uL — ABNORMAL HIGH (ref 4.0–10.5)
WBC: 31.4 10*3/uL — ABNORMAL HIGH (ref 4.0–10.5)
nRBC: 0 % (ref 0.0–0.2)
nRBC: 0.4 % — ABNORMAL HIGH (ref 0.0–0.2)

## 2022-03-13 MED ORDER — MAGNESIUM SULFATE 2 GM/50ML IV SOLN
2.0000 g | Freq: Once | INTRAVENOUS | Status: AC
  Administered 2022-03-13: 2 g via INTRAVENOUS
  Filled 2022-03-13: qty 50

## 2022-03-13 NOTE — Progress Notes (Signed)
PROGRESS NOTE    Dugan Vanhoesen [facility]  CWC:[id number] DOB: 1950/07/07 DOA: 03/02/2022 PCP: Redmond School, MD   Brief Narrative:    71 y.o. male with medical history significant of CLL, stage IV lung cancer with bony metastases currently on chemotherapy and has received radiation, hypertension, hyperlipidemia, COPD presented with rectal pain and hematuria with increasing confusion.  On presentation, he was found to be neutropenic.  CT of abdomen and pelvis with contrast showed acute pancreatitis but no abscess or pseudocyst along with right iliac bone infiltrative lytic masses consistent with metastases.  CT of the head without contrast was negative for any acute intracranial abnormality.  He was started on IV fluids, antibiotics and Granix.   He is noted to have Pseudomonas bacteremia and is receiving ongoing treatment with cefepime.  Neutropenia appears to have resolved with Granix.  ID consulted with further recommendations pending.  Assessment & Plan:   Principal Problem:   Neutropenic fever ([place]) Active Problems:   GERD (gastroesophageal reflux disease)   Chronic lymphocytic leukemia (HCC)   COPD (chronic obstructive pulmonary disease) (HCC)   Cancer associated pain   Squamous cell lung cancer, left (HCC)   Failure to thrive in adult   Prolonged QT interval   PSVT (paroxysmal supraventricular tachycardia)   Bacteremia due to Pseudomonas   Nonrheumatic aortic valve insufficiency   Nonrheumatic aortic valve stenosis  Assessment and Plan:   Pseudomonas bacteremia -Sensitivities demonstrating pansensitive microorganism Vancomycin has been discontinued. -repeat blood cx's neg; does not appear to require port removal, but appreciate ID evaluation -patient has remained afebrile. -Day 12 of cefepime with full course pending ID evaluation   Neutropenic fever-resolved -Absolute neutrophil count at 2400, discontinue further Granix -Repeat CBC with differential in AM. -s/p IVIG X1; will  follow further rec's by Dr. Tera Helper.   Stage IV lung cancer with bony metastasis/CLL -With cancer associated chronic pain -Follows up with Dr. Delton Coombes as an outpatient.  Has received radiation treatment recently.  Also undergoing chemotherapy.  Follow further recommendations from oncology. -Palliative care consultation appreciated; continue full code and full scope of practice. -Currently on scheduled OxyContin along with as needed oxycodone. -Expressing some pain in his back; see below for details as part of management.   Oral thrush -Complains of sore throat and has evidence of oral thrush.  -Continue treatment with nystatin   Paroxysmal A-fib with RVR -Patient had episodes of A-fib with RVR on 03/03/2022 requiring oral and IV metoprolol.   -Rate currently controlled.  -Appreciate cardiology following and recommendations.   -Continue Eliquis for secondary prevention..   Elevated lipase -Imaging studies showed acute pancreatitis but clinically patient does not have any symptoms or signs of pancreatitis with benign abdominal appreciated on examination -Most likely acute phase reactant.   Failure to thrive/Severe malnutrition -Nutrition following.   -Continue Megace. -Patient advised to increase oral intake. -Patient's family has been instructed to bring food from outside if needed to increase oral intake and broader options.   Hypertension -Blood pressure on the lower side.  Continue metoprolol if blood pressure allows.   Hypokalemia -will start daily supplementation and continue to follow trend.    Hypomagnesemia -Replace.  Repeat a.m. labs   Hyperlipidemia -Continue statin -Continue to follow LFTs and lipid profile as an outpatient.   Depression -Continue bupropion -No suicidal ideation or hallucination.   Possible PE -Patient was empirically started on Eliquis during his last most recent hospitalization from 01/26/2022-01/30/2022 as CT angiogram could not rule out  pulmonary embolism -Continue treatment  with Eliquis -Good saturation on room air.   Physical deconditioning -PT recommends home health PT -Expressing feeling weak and tired. -Home health orders will be placed at time of discharge.   Dysuria/rectal pain -Some improvement reported after Foley catheter removed -Given the still having symptoms we will use empirical Anusol cream and Pyridium  -Maintain adequate hydration and follow clinical response. -Florastor initiated for loose stools/diarrhea.   Upper back pain -Lidocaine patch has been ordered.   DVT prophylaxis: Eliquis Code Status: Full Family Communication: Wife at bedside Disposition Plan: Status is: Inpatient Remains inpatient appropriate because: Of severity of illness     Consultants: Oncology/palliative care/cardiology/ID Procedures: None   Antimicrobials:  Anti-infectives (From admission, onward)    Start     Dose/Rate Route Frequency Ordered Stop   03/04/22 1400  ceFEPIme (MAXIPIME) 2 g in sodium chloride 0.9 % 100 mL IVPB        2 g 200 mL/hr over 30 Minutes Intravenous Every 8 hours 03/04/22 0807     03/03/22 2300  vancomycin (VANCOREADY) IVPB 1250 mg/250 mL  Status:  Discontinued        1,250 mg 166.7 mL/hr over 90 Minutes Intravenous Every 24 hours 03/02/22 2148 03/04/22 0755   03/03/22 1000  valACYclovir (VALTREX) tablet 500 mg        500 mg Oral Daily 03/02/22 2220     03/03/22 0600  ceFEPIme (MAXIPIME) 2 g in sodium chloride 0.9 % 100 mL IVPB  Status:  Discontinued        2 g 200 mL/hr over 30 Minutes Intravenous Every 12 hours 03/02/22 2148 03/02/22 2149   03/03/22 0600  ceFEPIme (MAXIPIME) 2 g in sodium chloride 0.9 % 100 mL IVPB  Status:  Discontinued        2 g 200 mL/hr over 30 Minutes Intravenous Every 12 hours 03/02/22 2149 03/04/22 0807   03/02/22 2200  vancomycin (VANCOREADY) IVPB 1500 mg/300 mL        1,500 mg 150 mL/hr over 120 Minutes Intravenous  Once 03/02/22 2135 03/03/22 0043    03/02/22 1815  ceFEPIme (MAXIPIME) 2 g in sodium chloride 0.9 % 100 mL IVPB        2 g 200 mL/hr over 30 Minutes Intravenous  Once 03/02/22 1800 03/02/22 1908   03/02/22 1815  metroNIDAZOLE (FLAGYL) IVPB 500 mg        500 mg 100 mL/hr over 60 Minutes Intravenous  Once 03/02/22 1800 03/02/22 2016      Subjective: Patient seen and evaluated today with no new acute complaints or concerns. No acute concerns or events noted overnight.  Objective: Vitals:   03/12/22 1953 03/12/22 2049 03/13/22 0400 03/13/22 0500  BP: 116/72 117/62 110/67   Pulse: 92 92 89   Resp: 16 18 18    Temp: 97.6 F (36.4 C) (!) 96.9 F (36.1 C) (!) 97 F (36.1 C)   TempSrc: Oral     SpO2: 100% 99% 98%   Weight:    69.9 kg  Height:        Intake/Output Summary (Last 24 hours) at 03/13/2022 1446 Last data filed at 03/13/2022 0557 Gross per 24 hour  Intake 3017.02 ml  Output 1800 ml  Net 1217.02 ml   Filed Weights   03/11/22 0537 03/12/22 0338 03/13/22 0500  Weight: 64.5 kg 71.2 kg 69.9 kg    Examination:  General exam: Appears calm and comfortable, appears frail Respiratory system: Clear to auscultation. Respiratory effort normal. Cardiovascular system: S1 & S2  heard, RRR.  Gastrointestinal system: Abdomen is soft Central nervous system: Alert and awake Extremities: No edema Skin: No significant lesions noted Psychiatry: Flat affect.    Data Reviewed: I have personally reviewed following labs and imaging studies  CBC: Recent Labs  Lab 03/09/22 0405 03/10/22 0612 03/10/22 1703 03/11/22 0402 03/12/22 0333 03/13/22 0423  WBC 14.3* 14.5* 23.9* 18.0* 20.9* 31.4*  NEUTROABS 0.1* 0.2* 0.2* 0.2* 0.8*  --   HGB 8.5* 8.2* 8.9* 8.6* 8.7* 9.2*  HCT 26.8* 26.6* 28.5* 27.5* 27.8* 29.3*  MCV 94.7 94.3 95.0 94.2 93.3 92.7  PLT 252 247 308 278 292 604   Basic Metabolic Panel: Recent Labs  Lab 03/07/22 1202 03/08/22 0503 03/09/22 0405 03/09/22 1244 03/10/22 0612 03/11/22 0402 03/13/22 0423   NA 133* 133* 136  --  134* 137 135  K 3.6 2.9* 3.5  --  3.3* 3.1* 3.2*  CL 107 103 112*  --  110 111 109  CO2 20* 22 20*  --  21* 21* 20*  GLUCOSE 94 86 85  --  94 86 76  BUN 8 7* 6*  --  10 12 11   CREATININE 0.63 0.53* 0.54*  --  0.60* 0.64 0.64  CALCIUM 8.4* 7.5* 7.7*  --  7.7* 7.9* 8.2*  MG 1.4*  --  1.6* 1.6*  --   --  1.4*  PHOS  --   --  2.6  --   --   --   --    GFR: Estimated Creatinine Clearance: 84.9 mL/min (by C-G formula based on SCr of 0.64 mg/dL). Liver Function Tests: No results for input(s): "AST", "ALT", "ALKPHOS", "BILITOT", "PROT", "ALBUMIN" in the last 168 hours. No results for input(s): "LIPASE", "AMYLASE" in the last 168 hours. No results for input(s): "AMMONIA" in the last 168 hours. Coagulation Profile: No results for input(s): "INR", "PROTIME" in the last 168 hours. Cardiac Enzymes: No results for input(s): "CKTOTAL", "CKMB", "CKMBINDEX", "TROPONINI" in the last 168 hours. BNP (last 3 results) No results for input(s): "PROBNP" in the last 8760 hours. HbA1C: No results for input(s): "HGBA1C" in the last 72 hours. CBG: No results for input(s): "GLUCAP" in the last 168 hours. Lipid Profile: No results for input(s): "CHOL", "HDL", "LDLCALC", "TRIG", "CHOLHDL", "LDLDIRECT" in the last 72 hours. Thyroid Function Tests: No results for input(s): "TSH", "T4TOTAL", "FREET4", "T3FREE", "THYROIDAB" in the last 72 hours. Anemia Panel: No results for input(s): "VITAMINB12", "FOLATE", "FERRITIN", "TIBC", "IRON", "RETICCTPCT" in the last 72 hours. Sepsis Labs: No results for input(s): "PROCALCITON", "LATICACIDVEN" in the last 168 hours.  Recent Results (from the past 240 hour(s))  Culture, blood (Routine X 2) w Reflex to ID Panel     Status: None   Collection Time: 03/06/22  1:48 PM   Specimen: BLOOD RIGHT HAND  Result Value Ref Range Status   Specimen Description BLOOD RIGHT HAND  Final   Special Requests   Final    BOTTLES DRAWN AEROBIC AND ANAEROBIC Blood  Culture adequate volume   Culture   Final    NO GROWTH 5 DAYS Performed at [facility], [address], [place], [place] [address]    Report Status 03/11/2022 FINAL  Final         Radiology Studies: No results found.      Scheduled Meds:  (feeding supplement) PROSource Plus  30 mL Oral TID BM   apixaban  5 mg Oral BID   buPROPion  150 mg Oral Daily   Chlorhexidine Gluconate Cloth  6  each Topical Daily   feeding supplement  237 mL Oral TID BM   hydrocortisone   Rectal BID   lidocaine  1 patch Transdermal Daily   megestrol  400 mg Oral BID   metoprolol tartrate  50 mg Oral BID   midodrine  5 mg Oral TID WC   nystatin  5 mL Oral QID   oxyCODONE  20 mg Oral Q12H   phenazopyridine  100 mg Oral TID WC   potassium chloride  40 mEq Oral Daily   pravastatin  20 mg Oral q1800   saccharomyces boulardii  250 mg Oral BID   senna-docusate  2 tablet Oral BID   valACYclovir  500 mg Oral Daily   Continuous Infusions:  sodium chloride 75 mL/hr at 03/13/22 0310   ceFEPime (MAXIPIME) IV 2 g (03/13/22 1433)     LOS: 11 days    Time spent: 35 minutes    Danelly Hassinger Darleen Crocker, DO [facility]  If 7PM-7AM, please contact night-coverage [email or web address] 03/13/2022, 2:46 PM

## 2022-03-13 NOTE — Progress Notes (Signed)
[facility] for Infectious Disease  Total days of antibiotics 13 Reason for Consult:pseudomonal bacteremia   Referring Physician: shah  Principal Problem:   Neutropenic fever ([place]) Active Problems:   GERD (gastroesophageal reflux disease)   Chronic lymphocytic leukemia (HCC)   COPD (chronic obstructive pulmonary disease) (HCC)   Cancer associated pain   Squamous cell lung cancer, left (HCC)   Failure to thrive in adult   Prolonged QT interval   PSVT (paroxysmal supraventricular tachycardia)   Bacteremia due to Pseudomonas   Nonrheumatic aortic valve insufficiency   Nonrheumatic aortic valve stenosis    HPI: Alfred Brown is a 71 y.o. male with hx of CLL, stage IV lung ca with bony metastatic disease receives chemo via port and radiation. He was admitted on 12/2 with confusion, rectal pain, abdominal pain found to be neutropenic in addition to have acute pancreatitis. His infectious work up showed + PSA on blood cx. He was narrowed from broad spectrum abtx to cefepime. He has been also receiving GCSF to treat neutropenia. His WBC is 31K with lymphocytic predominance but ANC of 800. ID asked to weigh in for management and abtx course. His repeat blood cx on 12/6 are NGTD.  Past Medical History:  Diagnosis Date   Aortic valve disorder    Mild insufficiency   Chronic lymphocytic leukemia ([place])    01/2012: WBC-90.2, H&H-15.1/45.3, platelets-185 03/31/12: Verified with Dr. Tressie Stalker that no precautions or modification of medical regime are required prior to orthopaedic surgery.    CLL (chronic lymphocytic leukemia) (HCC)    CMV (cytomegalovirus) (HCC)    COPD (chronic obstructive pulmonary disease) (HCC)    Depression with anxiety    DJD (degenerative joint disease)    GERD (gastroesophageal reflux disease)    Hyperlipidemia    Hypogammaglobulinemia ([place]) 09/23/2019   Lipoma    left chest wall   Palpitations 2004   PVCs; borderline stress nuclear in 2004, negative in 2007;  Echo 2007; AV-sclerotic, very mild AI   Pneumonia    Right bundle branch block    + left posterior fascicular block   Tobacco abuse    80 pack years    Allergies: No Known Allergies  Current antibiotics:   MEDICATIONS:  (feeding supplement) PROSource Plus  30 mL Oral TID BM   apixaban  5 mg Oral BID   buPROPion  150 mg Oral Daily   Chlorhexidine Gluconate Cloth  6 each Topical Daily   feeding supplement  237 mL Oral TID BM   hydrocortisone   Rectal BID   lidocaine  1 patch Transdermal Daily   megestrol  400 mg Oral BID   metoprolol tartrate  50 mg Oral BID   midodrine  5 mg Oral TID WC   nystatin  5 mL Oral QID   oxyCODONE  20 mg Oral Q12H   phenazopyridine  100 mg Oral TID WC   potassium chloride  40 mEq Oral Daily   pravastatin  20 mg Oral q1800   saccharomyces boulardii  250 mg Oral BID   senna-docusate  2 tablet Oral BID   valACYclovir  500 mg Oral Daily    Social History   Tobacco Use   Smoking status: Former    Packs/day: 1.00    Years: 45.00    Total pack years: 45.00    Types: Cigarettes    Start date: 12/03/1963   Smokeless tobacco: Never   Tobacco comments:    Pt states he smokes almost a whole pack  daily. 12/04/21  Vaping Use   Vaping Use: Never used  Substance Use Topics   Alcohol use: No    Alcohol/week: 0.0 standard drinks of alcohol   Drug use: No    Family History  Problem Relation Age of Onset   Stroke Mother    Heart attack Father    Cancer Sister        colon   Colon cancer Sister    Cancer Brother        2 brothers died with lung cancer     OBJECTIVE: Temp:  [96.9 F (36.1 C)-97.6 F (36.4 C)] 97 F (36.1 C) (12/13 0400) Pulse Rate:  [89-92] 89 (12/13 0400) Resp:  [16-18] 18 (12/13 0400) BP: (110-117)/(62-72) 110/67 (12/13 0400) SpO2:  [98 %-100 %] 98 % (12/13 0400) Weight:  [69.9 kg] 69.9 kg (12/13 0500) Did not examine  LABS: Results for orders placed or performed during the hospital encounter of 03/02/22 (from the  past 48 hour(s))  CBC with Differential/Platelet     Status: Abnormal   Collection Time: 03/12/22  3:33 AM  Result Value Ref Range   WBC 20.9 (H) 4.0 - 10.5 K/uL   RBC 2.98 (L) 4.22 - 5.81 MIL/uL   Hemoglobin 8.7 (L) 13.0 - 17.0 g/dL   HCT 27.8 (L) 39.0 - 52.0 %   MCV 93.3 80.0 - 100.0 fL   MCH 29.2 26.0 - 34.0 pg   MCHC 31.3 30.0 - 36.0 g/dL   RDW 17.0 (H) 11.5 - 15.5 %   Platelets 292 150 - 400 K/uL   nRBC 0.2 0.0 - 0.2 %   Neutrophils Relative % 4 %   Neutro Abs 0.8 (L) 1.7 - 7.7 K/uL   Lymphocytes Relative 84 %   Lymphs Abs 17.6 (H) 0.7 - 4.0 K/uL   Monocytes Relative 7 %   Monocytes Absolute 1.5 (H) 0.1 - 1.0 K/uL   Eosinophils Relative 0 %   Eosinophils Absolute 0.0 0.0 - 0.5 K/uL   Basophils Relative 0 %   Basophils Absolute 0.0 0.0 - 0.1 K/uL   WBC Morphology MILD LEFT SHIFT (1-5% METAS, OCC MYELO, OCC BANDS)     Comment: SMUDGE CELLS   Smear Review MORPHOLOGY UNREMARKABLE    Metamyelocytes Relative 3 %   Promyelocytes Relative 2 %   Abs Immature Granulocytes 1.00 (H) 0.00 - 0.07 K/uL   Polychromasia PRESENT    Ovalocytes PRESENT     Comment: Performed at [facility], [address] [place], [place] [address]  CBC with Differential/Platelet     Status: Abnormal   Collection Time: 03/13/22  4:23 AM  Result Value Ref Range   WBC 31.4 (H) 4.0 - 10.5 K/uL   RBC 3.16 (L) 4.22 - 5.81 MIL/uL   Hemoglobin 9.2 (L) 13.0 - 17.0 g/dL   HCT 29.3 (L) 39.0 - 52.0 %   MCV 92.7 80.0 - 100.0 fL   MCH 29.1 26.0 - 34.0 pg   MCHC 31.4 30.0 - 36.0 g/dL   RDW 16.8 (H) 11.5 - 15.5 %   Platelets 304 150 - 400 K/uL   nRBC 0.4 (H) 0.0 - 0.2 %   Neutrophils Relative % 13 %   Lymphocytes Relative 73 %   Monocytes Relative 9 %   Eosinophils Relative 0 %   Basophils Relative 0 %   WBC Morphology MILD LEFT SHIFT (1-5% METAS, OCC MYELO, OCC BANDS)     Comment: SMUDGE CELLS ABSOLUTE LYMPHOCYTOSIS    RBC Morphology PRESENT  Metamyelocytes Relative 1 %   Myelocytes 1 %    Promyelocytes Relative 3 %    Comment: Performed at [facility], [address], [place], [place] [address]  Basic metabolic panel     Status: Abnormal   Collection Time: 03/13/22  4:23 AM  Result Value Ref Range   Sodium 135 135 - 145 mmol/L   Potassium 3.2 (L) 3.5 - 5.1 mmol/L   Chloride 109 98 - 111 mmol/L   CO2 20 (L) 22 - 32 mmol/L   Glucose, Bld 76 70 - 99 mg/dL    Comment: Glucose reference range applies only to samples taken after fasting for at least 8 hours.   BUN 11 8 - 23 mg/dL   Creatinine, Ser 0.64 0.61 - 1.24 mg/dL   Calcium 8.2 (L) 8.9 - 10.3 mg/dL   GFR, Estimated >60 >60 mL/min    Comment: (NOTE) Calculated using the CKD-EPI Creatinine Equation (2021)    Anion gap 6 5 - 15    Comment: Performed at [facility], [address], [place], [place] [address]  Magnesium     Status: Abnormal   Collection Time: 03/13/22  4:23 AM  Result Value Ref Range   Magnesium 1.4 (L) 1.7 - 2.4 mg/dL    Comment: Performed at [facility], [address] [place], [place] [address]    MICRO: 12/2 blood cx  IMAGING: No results found.  Reviewed CT and CXR  Assessment/Plan:  71yo M with CLL and metastatic lung CA admitted for neutropenic fever, found to have acute pancreatitis as well as pseudomonal bacteremia. Fevers resolved but still neutropenic. Concern for portacath as nidus of infection.  - recommend removal of portacatch - treat for 14 days using day 1 as day of removal of central line  Neutropenia = continue to monitor to see that it is recovering  Oral thrush = if nystatin not improving, can give short course of fluconazole 200mg  x 7-10  days to see if improving.

## 2022-03-13 NOTE — Progress Notes (Signed)
     Referral previously received for Alfred Brown for goals of care discussion. Noted most recent palliative in-person assessment dated 03/05/2022 at which time it was recommended to follow from a distance/chart check.   Chart reviewed for Recent provider notes, nurse notes, TOC notes, vitals, labs, and imaging and updates received from RN.   At this time patient appears stable.  WBC count did increase significantly today from 20.9 to 31.4 although he remains afebrile and vitals are stable.  Electrolytes and kidney function appear normal.  No new imaging. No plan for in person follow-up at this time given patient/family desire to not engage with palliative medicine and no substantial clinical changes today.   We will sign off at this time. Please feel free to notify us of any new palliative needs.  Thank you for your referral and allowing PMT to assist in [place] care.   Alfred Field, NP Palliative Medicine Team Phone: [phone]  NO CHARGE

## 2022-03-13 NOTE — Progress Notes (Signed)
[facility]  Will remove port in the minor room tomorrow under local and with cautery. Risk of bleeding given the eliquis. Will discuss with patient in the AM. No need to be NPO, can eat.   Curlene Labrum, MD [facility] [address] [place], [place] [address] [phone] (office)

## 2022-03-14 ENCOUNTER — Inpatient Hospital Stay: Payer: Self-pay

## 2022-03-14 ENCOUNTER — Inpatient Hospital Stay (HOSPITAL_COMMUNITY): Payer: Medicare Other

## 2022-03-14 ENCOUNTER — Encounter (HOSPITAL_COMMUNITY)
Admission: EM | Disposition: A | Discharge status: Home Health | Payer: Self-pay | Source: Home / Self Care | Attending: Internal Medicine

## 2022-03-14 DIAGNOSIS — R5081 Fever presenting with conditions classified elsewhere: Secondary | ICD-10-CM | POA: Diagnosis not present

## 2022-03-14 DIAGNOSIS — Z95828 Presence of other vascular implants and grafts: Secondary | ICD-10-CM

## 2022-03-14 DIAGNOSIS — D709 Neutropenia, unspecified: Secondary | ICD-10-CM | POA: Diagnosis not present

## 2022-03-14 DIAGNOSIS — R7881 Bacteremia: Secondary | ICD-10-CM

## 2022-03-14 DIAGNOSIS — Z452 Encounter for adjustment and management of vascular access device: Secondary | ICD-10-CM

## 2022-03-14 HISTORY — PX: PORT-A-CATH REMOVAL: SHX5289

## 2022-03-14 LAB — CBC WITH DIFFERENTIAL/PLATELET
Abs Immature Granulocytes: 3.4 10*3/uL — ABNORMAL HIGH (ref 0.00–0.07)
Band Neutrophils: 3 %
Basophils Absolute: 0 10*3/uL (ref 0.0–0.1)
Basophils Relative: 0 %
Eosinophils Absolute: 0 10*3/uL (ref 0.0–0.5)
Eosinophils Relative: 0 %
HCT: 29.8 % — ABNORMAL LOW (ref 39.0–52.0)
Hemoglobin: 9.3 g/dL — ABNORMAL LOW (ref 13.0–17.0)
Lymphocytes Relative: 64 %
Lymphs Abs: 21.8 10*3/uL — ABNORMAL HIGH (ref 0.7–4.0)
MCH: 28.8 pg (ref 26.0–34.0)
MCHC: 31.2 g/dL (ref 30.0–36.0)
MCV: 92.3 fL (ref 80.0–100.0)
Metamyelocytes Relative: 1 %
Monocytes Absolute: 3.8 10*3/uL — ABNORMAL HIGH (ref 0.1–1.0)
Monocytes Relative: 11 %
Myelocytes: 2 %
Neutro Abs: 5.1 10*3/uL (ref 1.7–7.7)
Neutrophils Relative %: 12 %
Platelets: 296 10*3/uL (ref 150–400)
Promyelocytes Relative: 7 %
RBC: 3.23 MIL/uL — ABNORMAL LOW (ref 4.22–5.81)
RDW: 17.2 % — ABNORMAL HIGH (ref 11.5–15.5)
WBC: 34.1 10*3/uL — ABNORMAL HIGH (ref 4.0–10.5)
nRBC: 0.1 % (ref 0.0–0.2)

## 2022-03-14 LAB — MAGNESIUM: Magnesium: 1.8 mg/dL (ref 1.7–2.4)

## 2022-03-14 LAB — BASIC METABOLIC PANEL
Anion gap: 5 (ref 5–15)
BUN: 12 mg/dL (ref 8–23)
CO2: 20 mmol/L — ABNORMAL LOW (ref 22–32)
Calcium: 8 mg/dL — ABNORMAL LOW (ref 8.9–10.3)
Chloride: 108 mmol/L (ref 98–111)
Creatinine, Ser: 0.63 mg/dL (ref 0.61–1.24)
GFR, Estimated: >60 mL/min (ref >60)
Glucose, Bld: 92 mg/dL (ref 70–99)
Potassium: 3.3 mmol/L — ABNORMAL LOW (ref 3.5–5.1)
Sodium: 133 mmol/L — ABNORMAL LOW (ref 135–145)

## 2022-03-14 SURGERY — MINOR REMOVAL PORT-A-CATH
Anesthesia: LOCAL | Laterality: Right

## 2022-03-14 MED ORDER — SODIUM CHLORIDE FLUSH 0.9 % IV SOLN
INTRAVENOUS | Status: AC
  Filled 2022-03-14: qty 10

## 2022-03-14 MED ORDER — CHLORHEXIDINE GLUCONATE CLOTH 2 % EX PADS: 6.0000 | MEDICATED_PAD | Freq: Once | CUTANEOUS | Status: DC

## 2022-03-14 MED ORDER — LIDOCAINE HCL (PF) 1 % IJ SOLN
INTRAMUSCULAR | Status: AC
  Filled 2022-03-14: qty 30

## 2022-03-14 MED ORDER — LIDOCAINE HCL (PF) 1 % IJ SOLN
INTRAMUSCULAR | Status: DC | PRN
  Administered 2022-03-14: 10 mL

## 2022-03-14 MED ORDER — HEPARIN SOD (PORK) LOCK FLUSH 100 UNIT/ML IV SOLN
INTRAVENOUS | Status: AC
  Filled 2022-03-14: qty 5

## 2022-03-14 MED ORDER — POTASSIUM CHLORIDE CRYS ER 20 MEQ PO TBCR
40.0000 meq | EXTENDED_RELEASE_TABLET | Freq: Two times a day (BID) | ORAL | Status: DC
  Administered 2022-03-14 – 2022-03-15 (×3): 40 meq via ORAL
  Filled 2022-03-14 (×3): qty 2

## 2022-03-14 SURGICAL SUPPLY — 13 items
APPLICATOR CHLORAPREP 10.5 ORG (MISCELLANEOUS) IMPLANT
CLOTH BEACON ORANGE TIMEOUT ST (SAFETY) ×1 IMPLANT
DECANTER SPIKE VIAL GLASS SM (MISCELLANEOUS) ×1 IMPLANT
DERMABOND ADVANCED .7 DNX12 (GAUZE/BANDAGES/DRESSINGS) ×1 IMPLANT
DRAPE HALF SHEET 40X57 (DRAPES) IMPLANT
GLOVE BIO SURGEON STRL SZ 6.5 (GLOVE) ×1 IMPLANT
GLOVE BIOGEL PI IND STRL 6.5 (GLOVE) ×1 IMPLANT
GLOVE BIOGEL PI IND STRL 7.0 (GLOVE) ×2 IMPLANT
GOWN STRL REUS W/TWL LRG LVL3 (GOWN DISPOSABLE) IMPLANT
SPONGE GAUZE 2X2 8PLY STRL LF (GAUZE/BANDAGES/DRESSINGS) ×1 IMPLANT
SUT MNCRL AB 4-0 PS2 18 (SUTURE) ×1 IMPLANT
SUT VIC AB 3-0 SH 27 (SUTURE) ×1
SUT VIC AB 3-0 SH 27X BRD (SUTURE) ×1 IMPLANT

## 2022-03-14 NOTE — Progress Notes (Signed)
Pharmacy Antibiotic Note  Alfred Brown is a 71 y.o. male admitted on 03/02/2022 with neutropenic fever/pseudomonas bacteremia  Pharmacy has been consulted for cefepime dosing.  Pseudomonas bacteremia. Neutropenia resolved. AF. Plan to remove central line/portacath today and continue Cefepime for 14 days.  Plan: Continue Cefepime 2 grams IV every 8 hours Monitor clinical progress, cultures/sensitivities, renal function, abx plan   Height: 6\' 1"  (185.4 cm) Weight: 69 kg (152 lb 1.9 oz) IBW/kg (Calculated) : 79.9  Temp (24hrs), Avg:97.3 F (36.3 C), Min:96.9 F (36.1 C), Max:97.8 F (36.6 C)  Recent Labs  Lab 03/09/22 0405 03/10/22 0612 03/10/22 1703 03/11/22 0402 03/12/22 0333 03/13/22 0423 03/14/22 0406  WBC 14.3* 14.5* 23.9* 18.0* 20.9* 31.4* 34.1*  CREATININE 0.54* 0.60*  --  0.64  --  0.64 0.63     Estimated Creatinine Clearance: 83.9 mL/min (by C-G formula based on SCr of 0.63 mg/dL).    No Known Allergies  Antimicrobials this admission: 12/2 Cefepime >>  12/2 Flagyl  12/2 vanc >>12/3    Microbiology results: 12/2 BCx: pseudomonas-  pan sensitive S- cipro 12/2 UCx: ng 12/6 [place] ngtd  Alfred Brown, BS Pharm D, [place] Clinical Pharmacist 03/14/2022 12:08 PM

## 2022-03-14 NOTE — TOC Progression Note (Signed)
Transition of Care [facility]) - Progression Note    Patient Details  Name: Alfred Brown MRN: [id number] Date of Birth: 02-Sep-1950  Transition of Care [facility]) CM/SW Contact  Salome Arnt, [place] Phone Number: 03/14/2022, 3:33 PM  Clinical Narrative: Per MD, antibiotics will be PO. Wife, Carolynn Sayers with [employer], and Vaughan Basta with [facility] aware. HHPT./RN orders are in. Pt's wife reports rolling walker was delivered to room.       Expected Discharge Plan: [place] Barriers to Discharge: Continued Medical Work up  Expected Discharge Plan and Services Expected Discharge Plan: [place]                         DME Arranged: Walker rolling DME Agency: [employer] Date DME Agency Contacted: 03/04/22 Time DME Agency Contacted: [phone] Representative spoke with at DME Agency: Erasmo Downer HH Arranged: PT, RN [facility] Agency: [place] ([place]) Date [place]: 03/04/22 Time [place]: [place] Representative spoke with at [place]: [place] ([place]) Interventions    Readmission Risk Interventions    01/28/2022   10:39 AM 01/21/2022   11:57 AM  Readmission Risk Prevention Plan  Transportation Screening Complete Complete  HRI or Home Care Consult  Complete  Social Work Consult for [place] Planning/Counseling  Complete  Palliative Care Screening  Not Applicable  Medication Review Press photographer) Complete Complete  PCP or Specialist appointment within 3-5 days of discharge Not Complete   HRI or [place] Complete   Palliative Care Screening Not Complete   [place] Not Applicable

## 2022-03-14 NOTE — Op Note (Signed)
[facility] Procedure Note  03/14/22  Pre-procedure Diagnosis: Port in place, bacteremia    Post-procedure Diagnosis: Same   Procedure(s) Performed: Port a catheter removal    Surgeon: Alfred Matar. Constance Haw, MD   Assistants: No qualified resident was available    Anesthesia: Lidocaine 1%    Specimens:  None    Estimated Blood Loss: Minimal  Wound Class: Dirty, Infected bacteremia    Procedure Indications: Alfred Brown is a 71 yo with a port in place for lymphoma and now with bacteremia. He needs his port out. He has been on eliquis. We discussed removal and risk of bleeding, infection, incomplete removal, pneumothorax. He opted to proceed.   Findings: Normal appearing port removed completely, cavity hemostatic    Procedure: The patient was taken to the procedure room and placed supine. The right chest and neck were prepared and draped in the usual sterile fashion. Lidocaine 1% was injected at the port incision site and into the cavity around the port.    An incision was made. Using sharp and blunt dissection the port was removed and the catheter slid out easily. My assistant held pressure for 10 minutes at the internal jugular entry site. The skin was closed with interrupted 3-0 Vicryl.  Benzoine and steri strips were placed.   Final inspection revealed acceptable hemostasis. The patient tolerated the procedure well.   CXR was ordered to confirm complete removal   Alfred Labrum, MD [facility] [address] [place], [place] [address] [phone] (office)

## 2022-03-14 NOTE — Progress Notes (Signed)
PROGRESS NOTE    Alfred Brown [facility]  ZCH:[id number] DOB: [birth year] DOA: 03/02/2022 PCP: Redmond School, MD   Brief Narrative:    71 y.o. male with medical history significant of CLL, stage IV lung cancer with bony metastases currently on chemotherapy and has received radiation, hypertension, hyperlipidemia, COPD presented with rectal pain and hematuria with increasing confusion.  On presentation, he was found to be neutropenic.  CT of abdomen and pelvis with contrast showed acute pancreatitis but no abscess or pseudocyst along with right iliac bone infiltrative lytic masses consistent with metastases.  CT of the head without contrast was negative for any acute intracranial abnormality.  He was started on IV fluids, antibiotics and Granix.    He is noted to have Pseudomonas bacteremia and is receiving ongoing treatment with cefepime.  Neutropenia appears to have resolved with Granix.  ID consulted on 12/13 with recommendations to remove port and resume IV antibiotics for 14 days thereafter.  Assessment & Plan:   Principal Problem:   Neutropenic fever ([place]) Active Problems:   GERD (gastroesophageal reflux disease)   Chronic lymphocytic leukemia (HCC)   COPD (chronic obstructive pulmonary disease) (HCC)   Cancer associated pain   Squamous cell lung cancer, left (HCC)   Failure to thrive in adult   Prolonged QT interval   PSVT (paroxysmal supraventricular tachycardia)   Bacteremia due to Pseudomonas   Nonrheumatic aortic valve insufficiency   Nonrheumatic aortic valve stenosis  Assessment and Plan:   Pseudomonas bacteremia -Sensitivities demonstrating pansensitive microorganism Vancomycin has been discontinued. -repeat blood cx's neg; does not appear to require port removal, but appreciate ID evaluation -patient has remained afebrile. -Day 13 of cefepime  -Appreciate ID recommendations 12/13 for port removal, general surgery consulted with plans to remove port later today.  Plan will  be to place PICC line after this is done in order to arrange for outpatient antibiotics.   Neutropenic fever-resolved -Absolute neutrophil count at 2400, discontinue further Granix -Repeat CBC with differential in AM. -s/p IVIG X1; will follow further rec's by Dr. Tera Helper.   Stage IV lung cancer with bony metastasis/CLL -With cancer associated chronic pain -Follows up with Dr. Delton Coombes as an outpatient.  Has received radiation treatment recently.  Also undergoing chemotherapy.  Follow further recommendations from oncology. -Palliative care consultation appreciated; continue full code and full scope of practice. -Currently on scheduled OxyContin along with as needed oxycodone. -Expressing some pain in his back; see below for details as part of management.   Oral thrush -Improved, completed course of nystatin over 10 days-discontinue for now   Paroxysmal A-fib with RVR -Patient had episodes of A-fib with RVR on 03/03/2022 requiring oral and IV metoprolol.   -Rate currently controlled.  -Appreciate cardiology following and recommendations.   -Continue Eliquis for secondary prevention..   Elevated lipase -Imaging studies showed acute pancreatitis but clinically patient does not have any symptoms or signs of pancreatitis with benign abdominal appreciated on examination -Most likely acute phase reactant.   Failure to thrive/Severe malnutrition -Nutrition following.   -Continue Megace. -Patient advised to increase oral intake. -Patient's family has been instructed to bring food from outside if needed to increase oral intake and broader options.   Hypertension -Blood pressure on the lower side.  Continue metoprolol if blood pressure allows.   Hypokalemia -will start daily supplementation and continue to follow trend.    Hyperlipidemia -Continue statin -Continue to follow LFTs and lipid profile as an outpatient.   Depression -Continue bupropion -No suicidal ideation or  hallucination.   Possible PE -Patient was empirically started on Eliquis during his last most recent hospitalization from 01/26/2022-01/30/2022 as CT angiogram could not rule out pulmonary embolism -Continue treatment with Eliquis -Good saturation on room air.   Physical deconditioning -PT recommends home health PT -Expressing feeling weak and tired. -Home health orders will be placed at time of discharge.   Dysuria/rectal pain -Some improvement reported after Foley catheter removed -Given the still having symptoms we will use empirical Anusol cream and Pyridium  -Maintain adequate hydration and follow clinical response. -Florastor initiated for loose stools/diarrhea.   Upper back pain -Lidocaine patch has been ordered.   DVT prophylaxis: Eliquis held for port remoal Code Status: Full Family Communication: Wife at bedside Disposition Plan: Status is: Inpatient Remains inpatient appropriate because: Of severity of illness     Consultants: Oncology/palliative care/cardiology/ID/Gen Surg Procedures: None   Antimicrobials:  Anti-infectives (From admission, onward)    Start     Dose/Rate Route Frequency Ordered Stop   03/04/22 1400  ceFEPIme (MAXIPIME) 2 g in sodium chloride 0.9 % 100 mL IVPB        2 g 200 mL/hr over 30 Minutes Intravenous Every 8 hours 03/04/22 0807     03/03/22 2300  vancomycin (VANCOREADY) IVPB 1250 mg/250 mL  Status:  Discontinued        1,250 mg 166.7 mL/hr over 90 Minutes Intravenous Every 24 hours 03/02/22 2148 03/04/22 0755   03/03/22 1000  valACYclovir (VALTREX) tablet 500 mg        500 mg Oral Daily 03/02/22 2220     03/03/22 0600  ceFEPIme (MAXIPIME) 2 g in sodium chloride 0.9 % 100 mL IVPB  Status:  Discontinued        2 g 200 mL/hr over 30 Minutes Intravenous Every 12 hours 03/02/22 2148 03/02/22 2149   03/03/22 0600  ceFEPIme (MAXIPIME) 2 g in sodium chloride 0.9 % 100 mL IVPB  Status:  Discontinued        2 g 200 mL/hr over 30 Minutes  Intravenous Every 12 hours 03/02/22 2149 03/04/22 0807   03/02/22 2200  vancomycin (VANCOREADY) IVPB 1500 mg/300 mL        1,500 mg 150 mL/hr over 120 Minutes Intravenous  Once 03/02/22 2135 03/03/22 0043   03/02/22 1815  ceFEPIme (MAXIPIME) 2 g in sodium chloride 0.9 % 100 mL IVPB        2 g 200 mL/hr over 30 Minutes Intravenous  Once 03/02/22 1800 03/02/22 1908   03/02/22 1815  metroNIDAZOLE (FLAGYL) IVPB 500 mg        500 mg 100 mL/hr over 60 Minutes Intravenous  Once 03/02/22 1800 03/02/22 2016       Subjective: Patient seen and evaluated today with no new acute complaints or concerns. No acute concerns or events noted overnight.  Objective: Vitals:   03/13/22 1646 03/13/22 2038 03/14/22 0339 03/14/22 0500  BP: 106/63 118/69 111/65   Pulse: 92 90 87   Resp: (!) 22 18 18    Temp: 97.8 F (36.6 C) (!) 96.9 F (36.1 C) (!) 97.1 F (36.2 C)   TempSrc: Oral     SpO2: 98% 99% 96%   Weight:    69 kg  Height:        Intake/Output Summary (Last 24 hours) at 03/14/2022 1104 Last data filed at 03/14/2022 1038 Gross per 24 hour  Intake 480 ml  Output 2200 ml  Net -1720 ml   Filed Weights   03/12/22 0338 03/13/22  0500 03/14/22 0500  Weight: 71.2 kg 69.9 kg 69 kg    Examination:  General exam: Appears calm and comfortable  Respiratory system: Clear to auscultation. Respiratory effort normal. Cardiovascular system: S1 & S2 heard, RRR.  Gastrointestinal system: Abdomen is soft Central nervous system: Alert and awake Extremities: No edema Skin: No significant lesions noted Psychiatry: Flat affect.    Data Reviewed: I have personally reviewed following labs and imaging studies  CBC: Recent Labs  Lab 03/10/22 0612 03/10/22 1703 03/11/22 0402 03/12/22 0333 03/13/22 0423 03/14/22 0406  WBC 14.5* 23.9* 18.0* 20.9* 31.4* 34.1*  NEUTROABS 0.2* 0.2* 0.2* 0.8*  --  5.1  HGB 8.2* 8.9* 8.6* 8.7* 9.2* 9.3*  HCT 26.6* 28.5* 27.5* 27.8* 29.3* 29.8*  MCV 94.3 95.0 94.2  93.3 92.7 92.3  PLT 247 308 278 292 304 124   Basic Metabolic Panel: Recent Labs  Lab 03/07/22 1202 03/08/22 0503 03/09/22 0405 03/09/22 1244 03/10/22 0612 03/11/22 0402 03/13/22 0423 03/14/22 0406  NA 133*   < > 136  --  134* 137 135 133*  K 3.6   < > 3.5  --  3.3* 3.1* 3.2* 3.3*  CL 107   < > 112*  --  110 111 109 108  CO2 20*   < > 20*  --  21* 21* 20* 20*  GLUCOSE 94   < > 85  --  94 86 76 92  BUN 8   < > 6*  --  10 12 11 12   CREATININE 0.63   < > 0.54*  --  0.60* 0.64 0.64 0.63  CALCIUM 8.4*   < > 7.7*  --  7.7* 7.9* 8.2* 8.0*  MG 1.4*  --  1.6* 1.6*  --   --  1.4* 1.8  PHOS  --   --  2.6  --   --   --   --   --    < > = values in this interval not displayed.   GFR: Estimated Creatinine Clearance: 83.9 mL/min (by C-G formula based on SCr of 0.63 mg/dL). Liver Function Tests: No results for input(s): "AST", "ALT", "ALKPHOS", "BILITOT", "PROT", "ALBUMIN" in the last 168 hours. No results for input(s): "LIPASE", "AMYLASE" in the last 168 hours. No results for input(s): "AMMONIA" in the last 168 hours. Coagulation Profile: No results for input(s): "INR", "PROTIME" in the last 168 hours. Cardiac Enzymes: No results for input(s): "CKTOTAL", "CKMB", "CKMBINDEX", "TROPONINI" in the last 168 hours. BNP (last 3 results) No results for input(s): "PROBNP" in the last 8760 hours. HbA1C: No results for input(s): "HGBA1C" in the last 72 hours. CBG: No results for input(s): "GLUCAP" in the last 168 hours. Lipid Profile: No results for input(s): "CHOL", "HDL", "LDLCALC", "TRIG", "CHOLHDL", "LDLDIRECT" in the last 72 hours. Thyroid Function Tests: No results for input(s): "TSH", "T4TOTAL", "FREET4", "T3FREE", "THYROIDAB" in the last 72 hours. Anemia Panel: No results for input(s): "VITAMINB12", "FOLATE", "FERRITIN", "TIBC", "IRON", "RETICCTPCT" in the last 72 hours. Sepsis Labs: No results for input(s): "PROCALCITON", "LATICACIDVEN" in the last 168 hours.  Recent Results (from  the past 240 hour(s))  Culture, blood (Routine X 2) w Reflex to ID Panel     Status: None   Collection Time: 03/06/22  1:48 PM   Specimen: BLOOD RIGHT HAND  Result Value Ref Range Status   Specimen Description BLOOD RIGHT HAND  Final   Special Requests   Final    BOTTLES DRAWN AEROBIC AND ANAEROBIC Blood Culture adequate volume  Culture   Final    NO GROWTH 5 DAYS Performed at [facility], [address], [place], [place] [address]    Report Status 03/11/2022 FINAL  Final         Radiology Studies: No results found.      Scheduled Meds:  (feeding supplement) PROSource Plus  30 mL Oral TID BM   buPROPion  150 mg Oral Daily   Chlorhexidine Gluconate Cloth  6 each Topical Daily   feeding supplement  237 mL Oral TID BM   hydrocortisone   Rectal BID   lidocaine  1 patch Transdermal Daily   megestrol  400 mg Oral BID   metoprolol tartrate  50 mg Oral BID   midodrine  5 mg Oral TID WC   oxyCODONE  20 mg Oral Q12H   phenazopyridine  100 mg Oral TID WC   potassium chloride  40 mEq Oral BID   pravastatin  20 mg Oral q1800   saccharomyces boulardii  250 mg Oral BID   senna-docusate  2 tablet Oral BID   valACYclovir  500 mg Oral Daily   Continuous Infusions:  sodium chloride 75 mL/hr at 03/14/22 0902   ceFEPime (MAXIPIME) IV 2 g (03/14/22 0548)     LOS: 12 days    Time spent: 35 minutes    Anas Reister Darleen Crocker, DO [facility]  If 7PM-7AM, please contact night-coverage [email or web address] 03/14/2022, 11:04 AM

## 2022-03-14 NOTE — TOC Progression Note (Signed)
Transition of Care [facility]) - Progression Note    Patient Details  Name: Alfred Brown MRN: [id number] Date of Birth: 1950/05/22  Transition of [facility] CM/SW Contact  Salome Arnt, [place] Phone Number: 03/14/2022, 12:04 PM  Clinical Narrative:  Pt will require 14 days IV antibiotics. Discussed with pt's wife who is willing to be taught. Updated Linda with [facility]. LCSW notified Carolynn Sayers with [employer] on need for IV antibiotics. Pam will follow. Per MD, pt will have PICC line placed and anticipate d/c tomorrow.      Expected Discharge Plan: [place] Barriers to Discharge: Continued Medical Work up  Expected Discharge Plan and Services Expected Discharge Plan: [place]                         DME Arranged: [place] rolling DME Agency: [employer] Date DME Agency Contacted: 03/04/22 Time DME Agency Contacted: [phone] Representative spoke with at DME Agency: Erasmo Downer HH Arranged: PT, RN [facility] Agency: [place] ([place]) Date [place]: 03/04/22 Time [place]: [place] Representative spoke with at [place]: [place] ([place]) Interventions    Readmission Risk Interventions    01/28/2022   10:39 AM 01/21/2022   11:57 AM  Readmission Risk Prevention Plan  Transportation Screening Complete Complete  HRI or Home Care Consult  Complete  Social Work Consult for [place] Planning/Counseling  Complete  Palliative Care Screening  Not Applicable  Medication Review Press photographer) Complete Complete  PCP or Specialist appointment within 3-5 days of discharge Not Complete   HRI or [place] Complete   Palliative Care Screening Not Complete   [place] Not Applicable

## 2022-03-14 NOTE — Progress Notes (Signed)
EKG done and given to nurse 

## 2022-03-14 NOTE — Progress Notes (Signed)
AT bedside for PICC placement. Wife at bedside. Wife expresses concern over PICC placement at this time. Wife prefers to speak with MD before insertion. RN aware. Will follow up in AM.

## 2022-03-14 NOTE — Progress Notes (Deleted)
Day of Surgery 03/14/22 Subjective: Pt feeling stable overall today. Some pain in the right side of his abdomen which he states is consistent with his known h/o mets. He also notes some mild irritation to his groin but this has been an ongoing problem since his condom catheter was put in place. No SOB, chest pain, fevers.   Objective: Vital signs in last 24 hours: Temp:  [96.9 F (36.1 C)-97.8 F (36.6 C)] 97.1 F (36.2 C) (12/14 0339) Pulse Rate:  [87-92] 87 (12/14 0339) Resp:  [18-22] 18 (12/14 0339) BP: (106-118)/(63-69) 111/65 (12/14 0339) SpO2:  [96 %-99 %] 96 % (12/14 0339) Weight:  [69 kg] 69 kg (12/14 0500) Last BM Date : 03/10/22  Intake/Output from previous day: 12/13 0701 - 12/14 0700 In: 240 [P.O.:240] Out: 2200 [Urine:2200] Intake/Output this shift: No intake/output data recorded.  General appearance: alert, cooperative, and cachectic Head: Normocephalic, without obvious abnormality, atraumatic Neck: supple, symmetrical, trachea midline Resp: clear to auscultation bilaterally Chest wall: no tenderness, right sided chest wall tenderness Cardio: regular rate and rhythm and S1, S2 normal Pulses: 2+ and symmetric Skin: Skin color, texture, turgor normal. No rashes or lesions Potrt in place to the right chest wall without signs of overlying erythema. No tenderness to the surrounding area.   Lab Results:  Recent Labs    03/13/22 0423 03/14/22 0406  WBC 31.4* 34.1*  HGB 9.2* 9.3*  HCT 29.3* 29.8*  PLT 304 296   BMET Recent Labs    03/13/22 0423 03/14/22 0406  NA 135 133*  K 3.2* 3.3*  CL 109 108  CO2 20* 20*  GLUCOSE 76 92  BUN 11 12  CREATININE 0.64 0.63  CALCIUM 8.2* 8.0*   PT/INR No results for input(s): "LABPROT", "INR" in the last 72 hours.  Studies/Results: No results found.  Anti-infectives: Anti-infectives (From admission, onward)    Start     Dose/Rate Route Frequency Ordered Stop   03/04/22 1400  ceFEPIme (MAXIPIME) 2 g in sodium  chloride 0.9 % 100 mL IVPB        2 g 200 mL/hr over 30 Minutes Intravenous Every 8 hours 03/04/22 0807     03/03/22 2300  vancomycin (VANCOREADY) IVPB 1250 mg/250 mL  Status:  Discontinued        1,250 mg 166.7 mL/hr over 90 Minutes Intravenous Every 24 hours 03/02/22 2148 03/04/22 0755   03/03/22 1000  valACYclovir (VALTREX) tablet 500 mg        500 mg Oral Daily 03/02/22 2220     03/03/22 0600  ceFEPIme (MAXIPIME) 2 g in sodium chloride 0.9 % 100 mL IVPB  Status:  Discontinued        2 g 200 mL/hr over 30 Minutes Intravenous Every 12 hours 03/02/22 2148 03/02/22 2149   03/03/22 0600  ceFEPIme (MAXIPIME) 2 g in sodium chloride 0.9 % 100 mL IVPB  Status:  Discontinued        2 g 200 mL/hr over 30 Minutes Intravenous Every 12 hours 03/02/22 2149 03/04/22 0807   03/02/22 2200  vancomycin (VANCOREADY) IVPB 1500 mg/300 mL        1,500 mg 150 mL/hr over 120 Minutes Intravenous  Once 03/02/22 2135 03/03/22 0043   03/02/22 1815  ceFEPIme (MAXIPIME) 2 g in sodium chloride 0.9 % 100 mL IVPB        2 g 200 mL/hr over 30 Minutes Intravenous  Once 03/02/22 1800 03/02/22 1908   03/02/22 1815  metroNIDAZOLE (FLAGYL) IVPB 500 mg  500 mg 100 mL/hr over 60 Minutes Intravenous  Once 03/02/22 1800 03/02/22 2016       Assessment/Plan: Procedure(s): MINOR REMOVAL PORT-A-CATH  Will proceed with port removal today as minor procedure with local anesthetic and cautery. This is being performed d/t ongoing pansensitive PsA bacteremia. Pt currently on day 13 of Cefepime. Eliquis was held yesterday prior to procedure. No need for NPO.   This was discussed at bedside with patient and his wife who are in agreement with the plan.    Erasmo Downer 03/14/2022

## 2022-03-14 NOTE — Progress Notes (Signed)
[facility]  CXR with port removed. No issues.   Curlene Labrum, MD [facility] [address] [place], [place] [address] [phone] (office)

## 2022-03-14 NOTE — Discharge Instructions (Signed)
Port removal  Bruising is common and expected with the blood thinners.  Limit stretching, pulling on your incision if it is located on other parts of your body.  Do not pick at the steristrips. These will fall off in 5-7 days. If they peel off, you can remove them.  If the fall off sooner, it is ok. Keep the area clean and dry with a dry bandage.  Do not place lotions or balms on your incision unless instructed to specifically by Dr. Constance Haw.  Follow up with your PCP.

## 2022-03-14 NOTE — Progress Notes (Signed)
Pt off unit for procedure.

## 2022-03-14 NOTE — Progress Notes (Signed)
[facility]  Updated his wife about procedure. CXR ordered. Printed her out a photo of the port and patient has in his bed.  Port removal Instructions:  Bruising is common and expected with the blood thinners.  Limit stretching, pulling on your incision if it is located on other parts of your body.  Do not pick at the steristrips. These will fall off in 5-7 days. If they peel off, you can remove them.  If the fall off sooner, it is ok. Keep the area clean and dry with a dry bandage.  Do not place lotions or balms on your incision unless instructed to specifically by Dr. Constance Haw.  Follow up with your PCP.   Curlene Labrum, MD [facility] [address] [place], [place] [address] [phone] (office)

## 2022-03-14 NOTE — Progress Notes (Signed)
Pt returned from PACU via bed. Pt A&O. Steristrips intact to right upper chest port removal site. Small amount serosanguinous drainage noted. Breath sounds clear to auscultation, HR with RRR and murmur noted. Telemetry reports NSR. IVF infusing via peripheral IV site in left forearm. Condom cath intact with clear yellow urine noted in drainage bag. Wife at bedside. Pt and wife deny any needs at this time.Bed in low position, call bell within reach. Advised to call for needs, both state understanding.

## 2022-03-14 NOTE — Consult Note (Addendum)
I was present with the medical student for this service. I personally verified the history of present illness, performed the physical exam, and made the plan for this encounter. I have verified the medical student's documentation and made modifications where appropriately. I have personally documented in my own words a brief history, physical, and plan below.     Port needs to be removed. Having fevers and bacteremia. On eliquis. Additional risk of bleeding. Will use cautery.   Curlene Labrum, MD [facility] [address] [place], [place] [address] [phone] (office)   [place]  Reason for Consult: Port removal iso PsA bacteremia   Referring Physician: Rodena Goldmann, DO  Chief Complaint   Rectal Pain; Hematuria     HPI: Alfred Brown is a 71 y.o. male with h/o CLL, stage IV lung cancer (not currently on treatment), COPD, afib, who initially presented with confusion, abdominal/rectal pain and originally admitted for neutropenic fever and now found to have ongoing PsA bacteremia. Throughout his admission he has continued to improve on current therapy and is without acute complaint. He has some mild pain to the right side of his abdomen which he states is consistent with his h/o mets to the area. He denies any fevers, CP, SOB.   Past Medical History:  Diagnosis Date   Aortic valve disorder    Mild insufficiency   Chronic lymphocytic leukemia ([place])    01/2012: WBC-90.2, H&H-15.1/45.3, platelets-185 03/31/12: Verified with Dr. Tressie Stalker that no precautions or modification of medical regime are required prior to orthopaedic surgery.    CLL (chronic lymphocytic leukemia) (HCC)    CMV (cytomegalovirus) (HCC)    COPD (chronic obstructive pulmonary disease) (HCC)    Depression with anxiety    DJD (degenerative joint disease)    GERD (gastroesophageal reflux disease)    Hyperlipidemia    Hypogammaglobulinemia ([place]) 09/23/2019    Lipoma    left chest wall   Palpitations 2004   PVCs; borderline stress nuclear in 2004, negative in 2007; Echo 2007; AV-sclerotic, very mild AI   Pneumonia    Right bundle branch block    + left posterior fascicular block   Tobacco abuse    80 pack years    Past Surgical History:  Procedure Laterality Date   COLONOSCOPY  01/31/2012   Negative screening study   GANGLION CYST EXCISION  04/02/1995   left wrist   IR IMAGING GUIDED PORT INSERTION  01/14/2022   LIPOMA EXCISION Left 10/11/2021   chest wall   ROTATOR CUFF REPAIR  04/02/1995   right   SHOULDER ARTHROSCOPY WITH SUBACROMIAL DECOMPRESSION  04/09/2012   Procedure: SHOULDER ARTHROSCOPY WITH SUBACROMIAL DECOMPRESSION;  Surgeon: Ninetta Lights, MD;  Location: [place];  Service: Orthopedics;  Laterality: Left;  LEFT SHOULDER ARTHROSCOPY, SUBACROMIAL DECOMPRESSION, PARTIAL ACROMIOPLASTY WITH CORACOACROMIAL RELEASE, DISTAL CLAVICULECTOMY WITH ROTATOR CUFF REPAIR, DEBRIDEMENT OF LABRUM    Family History  Problem Relation Age of Onset   Stroke Mother    Heart attack Father    Cancer Sister        colon   Colon cancer Sister    Cancer Brother        2 brothers died with lung cancer    Social History   Tobacco Use   Smoking status: Former    Packs/day: 1.00    Years: 45.00    Total pack years: 45.00    Types: Cigarettes    Start date: 12/03/1963   Smokeless tobacco:  Never   Tobacco comments:    Pt states he smokes almost a whole pack daily. 12/04/21  Vaping Use   Vaping Use: Never used  Substance Use Topics   Alcohol use: No    Alcohol/week: 0.0 standard drinks of alcohol   Drug use: No    Medications: I have reviewed the patient's current medications.  No Known Allergies   ROS:  Pertinent items are noted in HPI.  Blood pressure 111/65, pulse 87, temperature (!) 97.1 F (36.2 C), resp. rate 18, height 6\' 1"  (1.854 m), weight 69 kg, SpO2 96 %. Physical Exam Vitals and nursing note  reviewed.  Constitutional:      Comments: Cachectic appearing.   HENT:     Head: Normocephalic and atraumatic.     Nose: Nose normal.     Mouth/Throat:     Mouth: Mucous membranes are moist.  Eyes:     Extraocular Movements: Extraocular movements intact.     Conjunctiva/sclera: Conjunctivae normal.  Cardiovascular:     Rate and Rhythm: Normal rate and regular rhythm.     Pulses: Normal pulses.  Pulmonary:     Effort: Pulmonary effort is normal.     Breath sounds: Normal breath sounds.     Comments: Port in place to the upper right chest wall. No overlying erythema or tenderness.  Abdominal:     General: Abdomen is flat. There is no distension.     Palpations: Abdomen is soft.     Tenderness: There is no abdominal tenderness. There is no guarding or rebound.  Genitourinary:    Penis: Normal.      Comments: Condom cath in place.  Musculoskeletal:        General: Normal range of motion.     Cervical back: Normal range of motion and neck supple.     Right lower leg: No edema.     Left lower leg: No edema.  Skin:    General: Skin is warm and dry.  Neurological:     Mental Status: He is alert.  Psychiatric:        Behavior: Behavior normal.    Results: Results for orders placed or performed during the hospital encounter of 03/02/22 (from the past 48 hour(s))  CBC with Differential/Platelet     Status: Abnormal   Collection Time: 03/13/22  4:23 AM  Result Value Ref Range   WBC 31.4 (H) 4.0 - 10.5 K/uL   RBC 3.16 (L) 4.22 - 5.81 MIL/uL   Hemoglobin 9.2 (L) 13.0 - 17.0 g/dL   HCT 29.3 (L) 39.0 - 52.0 %   MCV 92.7 80.0 - 100.0 fL   MCH 29.1 26.0 - 34.0 pg   MCHC 31.4 30.0 - 36.0 g/dL   RDW 16.8 (H) 11.5 - 15.5 %   Platelets 304 150 - 400 K/uL   nRBC 0.4 (H) 0.0 - 0.2 %   Neutrophils Relative % 13 %   Lymphocytes Relative 73 %   Monocytes Relative 9 %   Eosinophils Relative 0 %   Basophils Relative 0 %   WBC Morphology MILD LEFT SHIFT (1-5% METAS, OCC MYELO, OCC BANDS)      Comment: SMUDGE CELLS ABSOLUTE LYMPHOCYTOSIS    RBC Morphology PRESENT    Metamyelocytes Relative 1 %   Myelocytes 1 %   Promyelocytes Relative 3 %    Comment: Performed at [facility], [address], [place], [place] [address]  Basic metabolic panel     Status: Abnormal   Collection Time:  03/13/22  4:23 AM  Result Value Ref Range   Sodium 135 135 - 145 mmol/L   Potassium 3.2 (L) 3.5 - 5.1 mmol/L   Chloride 109 98 - 111 mmol/L   CO2 20 (L) 22 - 32 mmol/L   Glucose, Bld 76 70 - 99 mg/dL    Comment: Glucose reference range applies only to samples taken after fasting for at least 8 hours.   BUN 11 8 - 23 mg/dL   Creatinine, Ser 0.64 0.61 - 1.24 mg/dL   Calcium 8.2 (L) 8.9 - 10.3 mg/dL   GFR, Estimated >60 >60 mL/min    Comment: (NOTE) Calculated using the CKD-EPI Creatinine Equation (2021)    Anion gap 6 5 - 15    Comment: Performed at [facility], [address], [place], [place] [address]  Magnesium     Status: Abnormal   Collection Time: 03/13/22  4:23 AM  Result Value Ref Range   Magnesium 1.4 (L) 1.7 - 2.4 mg/dL    Comment: Performed at [facility], [address] [place], [place] [address]  CBC with Differential/Platelet     Status: Abnormal   Collection Time: 03/14/22  4:06 AM  Result Value Ref Range   WBC 34.1 (H) 4.0 - 10.5 K/uL   RBC 3.23 (L) 4.22 - 5.81 MIL/uL   Hemoglobin 9.3 (L) 13.0 - 17.0 g/dL   HCT 29.8 (L) 39.0 - 52.0 %   MCV 92.3 80.0 - 100.0 fL   MCH 28.8 26.0 - 34.0 pg   MCHC 31.2 30.0 - 36.0 g/dL   RDW 17.2 (H) 11.5 - 15.5 %   Platelets 296 150 - 400 K/uL   nRBC 0.1 0.0 - 0.2 %   Neutrophils Relative % 12 %   Neutro Abs 5.1 1.7 - 7.7 K/uL   Band Neutrophils 3 %   Lymphocytes Relative 64 %   Lymphs Abs 21.8 (H) 0.7 - 4.0 K/uL   Monocytes Relative 11 %   Monocytes Absolute 3.8 (H) 0.1 - 1.0 K/uL   Eosinophils Relative 0 %   Eosinophils Absolute 0.0 0.0 - 0.5 K/uL   Basophils Relative 0 %   Basophils Absolute 0.0 0.0 - 0.1 K/uL   WBC  Morphology      MODERATE LEFT SHIFT (>5% METAS AND MYELOS,OCC PRO NOTED)   RBC Morphology RARE NRBC    Smear Review MORPHOLOGY UNREMARKABLE    Metamyelocytes Relative 1 %   Myelocytes 2 %   Promyelocytes Relative 7 %   Abs Immature Granulocytes 3.40 (H) 0.00 - 0.07 K/uL   Smudge Cells PRESENT    Tear Drop Cells PRESENT    Burr Cells PRESENT    Polychromasia PRESENT     Comment: Performed at [facility], [address] [place], [place] [address]  Basic metabolic panel     Status: Abnormal   Collection Time: 03/14/22  4:06 AM  Result Value Ref Range   Sodium 133 (L) 135 - 145 mmol/L   Potassium 3.3 (L) 3.5 - 5.1 mmol/L   Chloride 108 98 - 111 mmol/L   CO2 20 (L) 22 - 32 mmol/L   Glucose, Bld 92 70 - 99 mg/dL    Comment: Glucose reference range applies only to samples taken after fasting for at least 8 hours.   BUN 12 8 - 23 mg/dL   Creatinine, Ser 0.63 0.61 - 1.24 mg/dL   Calcium 8.0 (L) 8.9 - 10.3 mg/dL   GFR, Estimated >60 >60 mL/min    Comment: (  NOTE) Calculated using the CKD-EPI Creatinine Equation (2021)    Anion gap 5 5 - 15    Comment: Performed at [facility], [address], [place], [place] [address]  Magnesium     Status: None   Collection Time: 03/14/22  4:06 AM  Result Value Ref Range   Magnesium 1.8 1.7 - 2.4 mg/dL    Comment: Performed at [facility], [address] [place], [place] [address]    [place] EKG SITE RITE  Result Date: 03/14/2022 If [facility] image not attached, placement could not be confirmed due to current cardiac rhythm.    Assessment & Plan:  Alfred Brown is a 71 y.o. male originally admitted on 12/02 found to have neutropenic fever and bcx positive for pansensitive PsA bacteremia. He was started on Granix (d/c'd as his neutropenia has resolved) and Cefepime (currently Day 13). Primary team called consult for evaluation of port removal iso ongoing bacteremia. Eliquis was held yesterday.  -plan to remove port later today as minor procedure w/  local anaesthetic + cautery  -continue abx therapy per primary team   All questions were answered to the satisfaction of the patient and family.  The risk and benefits of this minor port removal procedure were discussed including but not limited to excess bleeding, hematoma, pain. This was also discussed with wife at bedside. After careful consideration, ALVAN CULPEPPER has decided to undergo port removal.    Erasmo Downer 03/14/2022, 12:59 PM

## 2022-03-15 DIAGNOSIS — D709 Neutropenia, unspecified: Secondary | ICD-10-CM | POA: Diagnosis not present

## 2022-03-15 DIAGNOSIS — R5081 Fever presenting with conditions classified elsewhere: Secondary | ICD-10-CM | POA: Diagnosis not present

## 2022-03-15 LAB — CBC WITH DIFFERENTIAL/PLATELET
Abs Immature Granulocytes: 4 10*3/uL — ABNORMAL HIGH (ref 0.00–0.07)
Basophils Absolute: 0 10*3/uL (ref 0.0–0.1)
Basophils Relative: 0 %
Eosinophils Absolute: 0 10*3/uL (ref 0.0–0.5)
Eosinophils Relative: 0 %
HCT: 31.7 % — ABNORMAL LOW (ref 39.0–52.0)
Hemoglobin: 9.9 g/dL — ABNORMAL LOW (ref 13.0–17.0)
Lymphocytes Relative: 73 %
Lymphs Abs: 32.8 10*3/uL — ABNORMAL HIGH (ref 0.7–4.0)
MCH: 28.9 pg (ref 26.0–34.0)
MCHC: 31.2 g/dL (ref 30.0–36.0)
MCV: 92.4 fL (ref 80.0–100.0)
Metamyelocytes Relative: 3 %
Monocytes Absolute: 2.7 10*3/uL — ABNORMAL HIGH (ref 0.1–1.0)
Monocytes Relative: 6 %
Myelocytes: 3 %
Neutro Abs: 5.4 10*3/uL (ref 1.7–7.7)
Neutrophils Relative %: 12 %
Platelets: 255 10*3/uL (ref 150–400)
Promyelocytes Relative: 3 %
RBC: 3.43 MIL/uL — ABNORMAL LOW (ref 4.22–5.81)
RDW: 17.2 % — ABNORMAL HIGH (ref 11.5–15.5)
WBC: 44.9 10*3/uL — ABNORMAL HIGH (ref 4.0–10.5)
nRBC: 0.1 % (ref 0.0–0.2)

## 2022-03-15 LAB — BASIC METABOLIC PANEL
Anion gap: 6 (ref 5–15)
BUN: 12 mg/dL (ref 8–23)
CO2: 20 mmol/L — ABNORMAL LOW (ref 22–32)
Calcium: 8.2 mg/dL — ABNORMAL LOW (ref 8.9–10.3)
Chloride: 110 mmol/L (ref 98–111)
Creatinine, Ser: 0.59 mg/dL — ABNORMAL LOW (ref 0.61–1.24)
GFR, Estimated: >60 mL/min (ref >60)
Glucose, Bld: 111 mg/dL — ABNORMAL HIGH (ref 70–99)
Potassium: 3.9 mmol/L (ref 3.5–5.1)
Sodium: 136 mmol/L (ref 135–145)

## 2022-03-15 LAB — MAGNESIUM: Magnesium: 1.8 mg/dL (ref 1.7–2.4)

## 2022-03-15 MED ORDER — OXYBUTYNIN CHLORIDE 2.5 MG PO TABS: 2.5000 mg | ORAL_TABLET | Freq: Three times a day (TID) | ORAL | 0 refills | Status: AC | PRN

## 2022-03-15 MED ORDER — MIDODRINE HCL 5 MG PO TABS: 5.0000 mg | ORAL_TABLET | Freq: Three times a day (TID) | ORAL | 0 refills | Status: DC

## 2022-03-15 MED ORDER — POTASSIUM CHLORIDE CRYS ER 20 MEQ PO TBCR: 40.0000 meq | EXTENDED_RELEASE_TABLET | Freq: Every day | ORAL | 0 refills | Status: DC

## 2022-03-15 MED ORDER — METOPROLOL TARTRATE 50 MG PO TABS: 50.0000 mg | ORAL_TABLET | Freq: Two times a day (BID) | ORAL | 1 refills | Status: AC

## 2022-03-15 MED ORDER — SACCHAROMYCES BOULARDII 250 MG PO CAPS: 250.0000 mg | ORAL_CAPSULE | Freq: Two times a day (BID) | ORAL | 0 refills | Status: AC

## 2022-03-15 MED ORDER — LEVOFLOXACIN 750 MG PO TABS: 750.0000 mg | ORAL_TABLET | Freq: Every day | ORAL | 0 refills | Status: DC

## 2022-03-15 NOTE — Progress Notes (Signed)
Nsg Discharge Note  Admit Date:  03/02/2022 Discharge date: 03/15/2022   [place] to be D/C'd Home per MD order.  AVS completed.   Patient/caregiver able to verbalize understanding.  Discharge Medication: Allergies as of 03/15/2022   No Known Allergies      Medication List     TAKE these medications    acetaminophen 500 MG tablet Commonly known as: TYLENOL Take 500-1,000 mg by mouth every 6 (six) hours as needed for moderate pain.   albuterol 108 (90 Base) MCG/ACT inhaler Commonly known as: VENTOLIN HFA 2 puffs every 4 hours as needed if you can't catch your breath What changed:  how much to take how to take this when to take this reasons to take this additional instructions   apixaban 5 MG Tabs tablet Commonly known as: ELIQUIS Take 1 tablet (5 mg total) by mouth 2 (two) times daily.   benzonatate 100 MG capsule Commonly known as: TESSALON Take 1 capsule (100 mg total) by mouth 3 (three) times daily as needed for cough.   buPROPion 150 MG 12 hr tablet Commonly known as: WELLBUTRIN SR Take 150 mg by mouth daily.   CALCIUM 600 + D PO Take 1 tablet by mouth daily.   CARBOPLATIN IV Inject into the vein every 21 ( twenty-one) days.   clotrimazole-betamethasone cream Commonly known as: LOTRISONE Apply 1 application  topically daily as needed (rash).   dextromethorphan 30 MG/5ML liquid Commonly known as: DELSYM Take 5 mLs (30 mg total) by mouth at bedtime as needed for cough.   diltiazem 30 MG tablet Commonly known as: CARDIZEM Take 30 mg by mouth as needed (heart rate).   feeding supplement Liqd Take 237 mLs by mouth 2 (two) times daily between meals.   fish oil-omega-3 fatty acids 1000 MG capsule Take 1 g by mouth daily.   fluticasone 50 MCG/ACT nasal spray Commonly known as: FLONASE Place 2 sprays into the nose daily.   levofloxacin 750 MG tablet Commonly known as: Levaquin Take 1 tablet (750 mg total) by mouth daily for 14 days.    lidocaine 5 % Commonly known as: Lidoderm Place 1 patch onto the skin daily. Remove and Discard patch within 12 hours. What changed:  when to take this reasons to take this   lidocaine-prilocaine cream Commonly known as: EMLA Apply a small amount to port a cath site and cover with plastic wrap 1 hour prior to infusion appointments   loperamide 2 MG capsule Commonly known as: IMODIUM Take 1 capsule (2 mg total) by mouth every 6 (six) hours as needed for diarrhea or loose stools.   Loratadine 10 MG Caps Take 10 mg by mouth daily.   lovastatin 20 MG tablet Commonly known as: MEVACOR Take 20 mg by mouth daily.   megestrol 400 MG/10ML suspension Commonly known as: MEGACE Take 10 mLs (400 mg total) by mouth 2 (two) times daily.   metoprolol tartrate 50 MG tablet Commonly known as: LOPRESSOR Take 1 tablet (50 mg total) by mouth 2 (two) times daily. What changed:  medication strength how much to take   midodrine 5 MG tablet Commonly known as: PROAMATINE Take 1 tablet (5 mg total) by mouth 3 (three) times daily with meals.   multivitamin tablet Take 1 tablet by mouth daily.   niacin 500 MG ER tablet Commonly known as: VITAMIN B3 Take 500 mg by mouth daily.   nystatin 100000 UNIT/ML suspension Commonly known as: MYCOSTATIN Take 15 mLs (1,500,000 Units total) by mouth  4 (four) times daily. Swish and swallow   oxyBUTYnin Chloride 2.5 MG Tabs Take 2.5 mg by mouth every 8 (eight) hours as needed for bladder spasms.   Oxycodone HCl 10 MG Tabs Take 1 tablet (10 mg total) by mouth every 4 (four) hours as needed.   oxyCODONE 20 mg 12 hr tablet Commonly known as: OXYCONTIN Take 1 tablet (20 mg total) by mouth every 12 (twelve) hours.   PACLITAXEL IV Inject into the vein every 21 ( twenty-one) days.   PEMBROLIZUMAB IV Inject into the vein every 21 ( twenty-one) days.   polyethylene glycol 17 g packet Commonly known as: MIRALAX / GLYCOLAX Take 17 g by mouth daily as  needed for moderate constipation.   potassium chloride SA 20 MEQ tablet Commonly known as: KLOR-CON M Take 2 tablets (40 mEq total) by mouth daily.   prochlorperazine 10 MG tablet Commonly known as: COMPAZINE Take 1 tablet (10 mg total) by mouth every 6 (six) hours as needed for nausea or vomiting.   saccharomyces boulardii 250 MG capsule Commonly known as: FLORASTOR Take 1 capsule (250 mg total) by mouth 2 (two) times daily.   sennosides-docusate sodium 8.6-50 MG tablet Commonly known as: SENOKOT-S Take 2 tablets by mouth 2 (two) times daily.   valACYclovir 500 MG tablet Commonly known as: VALTREX Take 500 mg by mouth daily.   zolpidem 10 MG tablet Commonly known as: AMBIEN Take 10 mg by mouth at bedtime as needed for sleep.               Durable Medical Equipment  (From admission, onward)           Start     Ordered   03/04/22 1358  For home use only DME Walker rolling  Once       Comments: Patient slightly unsteady without AD, requires use of RW for safety with good return for use demonstrated  Question Answer Comment  Walker: With [place]   Patient needs a walker to treat with the following condition Gait difficulty      03/04/22 1359            Discharge Assessment: Vitals:   03/14/22 2112 03/15/22 0444  BP: 139/72 130/89  Pulse: 91 92  Resp: 20 20  Temp: (!) 97.5 F (36.4 C) 97.6 F (36.4 C)  SpO2: 99% 96%   Skin clean, dry and intact without evidence of skin break down, no evidence of skin tears noted. IV catheter discontinued intact. Site without signs and symptoms of complications - no redness or edema noted at insertion site, patient denies c/o pain - only slight tenderness at site.  Dressing with slight pressure applied.  D/c Instructions-Education: Discharge instructions given to patient/family with verbalized understanding. D/c education completed with patient/family including follow up instructions, medication list, d/c  activities limitations if indicated, with other d/c instructions as indicated by MD - patient able to verbalize understanding, all questions fully answered. Patient instructed to return to ED, call 911, or call MD for any changes in condition.  Patient escorted via [place], and D/C home via private auto.  Kathie Rhodes, RN 03/15/2022 2:11 PM

## 2022-03-15 NOTE — Progress Notes (Signed)
Spoke with Dr. Delton Coombes, oncologist regarding PICC placement. MD stated the patient does not require immediate access for chemo/immunotherapy at this time. Patient will be on oral antibiotic therapy x 2 weeks at home, therefore, no treatments will be administered. Will make wife, RN, and Dr. Manuella Ghazi aware.

## 2022-03-15 NOTE — Progress Notes (Signed)
Vitals stable. Noticed V-tach on tele monitor in room, EKG completed and MD notified. Pt reported pain, PRN oxycodone given.

## 2022-03-15 NOTE — Care Management Important Message (Signed)
Important Message  Patient Details  Name: Alfred Brown MRN: [id number] Date of Birth: [birth year]   Medicare Important Message Given:  Yes (spoke with wife, Dayvion Sans at [phone] to review letter, no additional copy needed)     Tommy Medal 03/15/2022, 11:53 AM

## 2022-03-15 NOTE — Discharge Summary (Signed)
Physician Discharge Summary  Alfred Brown [facility] XLK:[id number] DOB: [birth year] DOA: 03/02/2022  PCP: Redmond School, MD  Admit date: 03/02/2022  Discharge date: 03/15/2022  Admitted From:Home  Disposition:  Home  Recommendations for Outpatient Follow-up:  Follow up with PCP in 1-2 weeks Follow-up with Dr. Delton Coombes in the near future regarding reinitiation of chemotherapy treatment after completion of antibiotic course for Pseudomonas bacteremia; access deferred at this time Continue on Levaquin 750 mg every day for 14 days as prescribed and recommended per ID Continue metoprolol 50 mg twice daily and midodrine 5 mg 3 times daily per cardiology recommendations Completed course of treatment for oral thrush  Home Health: Arranged with PT/RN  Equipment/Devices: None  Discharge Condition:Stable  CODE STATUS: Full  Diet recommendation: Heart Healthy  Brief/Interim Summary: 71 y.o. male with medical history significant of CLL, stage IV lung cancer with bony metastases currently on chemotherapy and has received radiation, hypertension, hyperlipidemia, COPD presented with rectal pain and hematuria with increasing confusion.  On presentation, he was found to be neutropenic.  CT of abdomen and pelvis with contrast showed acute pancreatitis but no abscess or pseudocyst along with right iliac bone infiltrative lytic masses consistent with metastases.  CT of the head without contrast was negative for any acute intracranial abnormality.  He was started on IV fluids, antibiotics and Granix due to neutropenia.  He was initially admitted with concerns of neutropenic fever and then eventually his blood cultures came back positive for Pseudomonas bacteremia.  Repeat blood cultures were negative and he continued to remain on treatment with IV antibiotics for several days and was receiving Granix daily to assist with increasing his absolute neutrophil count.  ID was eventually consulted with recommendations to  remove his port and this was performed on 12/14.  His Pseudomonas infection is noted to be pansensitive and he will be able to discharge on oral antibiotics as noted above for another 14-day course.  Throughout the course of the stay he did have some paroxysmal supraventricular tachycardia and he was seen by cardiology with recommendations to increase metoprolol dose and also use midodrine to help support blood pressure.  This has been added to his home medication regimen.  He was going to have PICC line placed on account of port removal so that he may continue with chemotherapy in the future, but patient and wife are reluctant to do this at this time and this will be arranged by oncologist outpatient.  No other acute events noted throughout this lengthy admission.  Discharge Diagnoses:  Principal Problem:   Neutropenic fever ([place]) Active Problems:   GERD (gastroesophageal reflux disease)   Chronic lymphocytic leukemia (HCC)   COPD (chronic obstructive pulmonary disease) (HCC)   Cancer associated pain   Squamous cell lung cancer, left (HCC)   Failure to thrive in adult   Prolonged QT interval   PSVT (paroxysmal supraventricular tachycardia)   Bacteremia due to Pseudomonas   Nonrheumatic aortic valve insufficiency   Nonrheumatic aortic valve stenosis   Port-A-Cath in place   Bacteremia  Principal discharge diagnosis: Neutropenic fever with Pseudomonas bacteremia in the setting of stage IV lung cancer with bony metastasis.  Discharge Instructions  Discharge Instructions     Diet - low sodium heart healthy   Complete by: As directed    Increase activity slowly   Complete by: As directed       Allergies as of 03/15/2022   No Known Allergies      Medication List     TAKE  these medications    acetaminophen 500 MG tablet Commonly known as: TYLENOL Take 500-1,000 mg by mouth every 6 (six) hours as needed for moderate pain.   albuterol 108 (90 Base) MCG/ACT inhaler Commonly  known as: VENTOLIN HFA 2 puffs every 4 hours as needed if you can't catch your breath What changed:  how much to take how to take this when to take this reasons to take this additional instructions   apixaban 5 MG Tabs tablet Commonly known as: ELIQUIS Take 1 tablet (5 mg total) by mouth 2 (two) times daily.   benzonatate 100 MG capsule Commonly known as: TESSALON Take 1 capsule (100 mg total) by mouth 3 (three) times daily as needed for cough.   buPROPion 150 MG 12 hr tablet Commonly known as: WELLBUTRIN SR Take 150 mg by mouth daily.   CALCIUM 600 + D PO Take 1 tablet by mouth daily.   CARBOPLATIN IV Inject into the vein every 21 ( twenty-one) days.   clotrimazole-betamethasone cream Commonly known as: LOTRISONE Apply 1 application  topically daily as needed (rash).   dextromethorphan 30 MG/5ML liquid Commonly known as: DELSYM Take 5 mLs (30 mg total) by mouth at bedtime as needed for cough.   diltiazem 30 MG tablet Commonly known as: CARDIZEM Take 30 mg by mouth as needed (heart rate).   feeding supplement Liqd Take 237 mLs by mouth 2 (two) times daily between meals.   fish oil-omega-3 fatty acids 1000 MG capsule Take 1 g by mouth daily.   fluticasone 50 MCG/ACT nasal spray Commonly known as: FLONASE Place 2 sprays into the nose daily.   levofloxacin 750 MG tablet Commonly known as: Levaquin Take 1 tablet (750 mg total) by mouth daily for 14 days.   lidocaine 5 % Commonly known as: Lidoderm Place 1 patch onto the skin daily. Remove and Discard patch within 12 hours. What changed:  when to take this reasons to take this   lidocaine-prilocaine cream Commonly known as: EMLA Apply a small amount to port a cath site and cover with plastic wrap 1 hour prior to infusion appointments   loperamide 2 MG capsule Commonly known as: IMODIUM Take 1 capsule (2 mg total) by mouth every 6 (six) hours as needed for diarrhea or loose stools.   Loratadine 10 MG  Caps Take 10 mg by mouth daily.   lovastatin 20 MG tablet Commonly known as: MEVACOR Take 20 mg by mouth daily.   megestrol 400 MG/10ML suspension Commonly known as: MEGACE Take 10 mLs (400 mg total) by mouth 2 (two) times daily.   metoprolol tartrate 50 MG tablet Commonly known as: LOPRESSOR Take 1 tablet (50 mg total) by mouth 2 (two) times daily. What changed:  medication strength how much to take   midodrine 5 MG tablet Commonly known as: PROAMATINE Take 1 tablet (5 mg total) by mouth 3 (three) times daily with meals.   multivitamin tablet Take 1 tablet by mouth daily.   niacin 500 MG ER tablet Commonly known as: VITAMIN B3 Take 500 mg by mouth daily.   nystatin 100000 UNIT/ML suspension Commonly known as: MYCOSTATIN Take 15 mLs (1,500,000 Units total) by mouth 4 (four) times daily. Swish and swallow   oxyBUTYnin Chloride 2.5 MG Tabs Take 2.5 mg by mouth every 8 (eight) hours as needed for bladder spasms.   Oxycodone HCl 10 MG Tabs Take 1 tablet (10 mg total) by mouth every 4 (four) hours as needed.   oxyCODONE 20 mg 12 hr  tablet Commonly known as: OXYCONTIN Take 1 tablet (20 mg total) by mouth every 12 (twelve) hours.   PACLITAXEL IV Inject into the vein every 21 ( twenty-one) days.   PEMBROLIZUMAB IV Inject into the vein every 21 ( twenty-one) days.   polyethylene glycol 17 g packet Commonly known as: MIRALAX / GLYCOLAX Take 17 g by mouth daily as needed for moderate constipation.   potassium chloride SA 20 MEQ tablet Commonly known as: KLOR-CON M Take 2 tablets (40 mEq total) by mouth daily.   prochlorperazine 10 MG tablet Commonly known as: COMPAZINE Take 1 tablet (10 mg total) by mouth every 6 (six) hours as needed for nausea or vomiting.   saccharomyces boulardii 250 MG capsule Commonly known as: FLORASTOR Take 1 capsule (250 mg total) by mouth 2 (two) times daily.   sennosides-docusate sodium 8.6-50 MG tablet Commonly known as:  SENOKOT-S Take 2 tablets by mouth 2 (two) times daily.   valACYclovir 500 MG tablet Commonly known as: VALTREX Take 500 mg by mouth daily.   zolpidem 10 MG tablet Commonly known as: AMBIEN Take 10 mg by mouth at bedtime as needed for sleep.               Durable Medical Equipment  (From admission, onward)           Start     Ordered   03/04/22 1358  For home use only DME Walker rolling  Once       Comments: Patient slightly unsteady without AD, requires use of RW for safety with good return for use demonstrated  Question Answer Comment  Walker: With [place]   Patient needs a walker to treat with the following condition Gait difficulty      03/04/22 1359            Follow-up Information     Redmond School, MD. Schedule an appointment as soon as possible for a visit in 1 week(s).   Specialty: Internal Medicine Contact information: [address] [place] [address] [phone]         Derek Jack, MD. Go to.   Specialty: Hematology Contact information: [place] [address] [place] Follow up.   Why: Will contact you to schedule home health visits.               No Known Allergies  Consultations: Oncology Palliative care Cardiology ID General surgery   Procedures/Studies: DG Chest Port 1 View  Result Date: 03/14/2022 CLINICAL DATA:  Port-A-Cath removal. EXAM: PORTABLE CHEST 1 VIEW COMPARISON:  March 02, 2022.  January 27, 2022. FINDINGS: Stable cardiomediastinal silhouette. Right internal jugular Port-A-Cath has been removed. No pneumothorax or pleural effusion is noted. Stable left upper lobe opacities are noted consistent with pneumonia. Destructive lesion seen in right fourth rib is not well visualized on this radiograph. IMPRESSION: Interval removal of right internal jugular Port-A-Cath. Stable left upper lobe opacities as described above. Electronically  Signed   By: Marijo Conception M.D.   On: 03/14/2022 15:24   [place] EKG SITE RITE  Result Date: 03/14/2022 If Site Rite image not attached, placement could not be confirmed due to current cardiac rhythm.  CT ABDOMEN PELVIS W CONTRAST  Result Date: 03/02/2022 CLINICAL DATA:  Lung cancer.  Sepsis.  Evaluate for metastasis. EXAM: CT ABDOMEN AND PELVIS WITH CONTRAST TECHNIQUE: Multidetector CT imaging of the abdomen and pelvis was performed using the  standard protocol following bolus administration of intravenous contrast. RADIATION DOSE REDUCTION: This exam was performed according to the departmental dose-optimization program which includes automated exposure control, adjustment of the mA and/or kV according to patient size and/or use of iterative reconstruction technique. CONTRAST:  175mL OMNIPAQUE IOHEXOL 300 MG/ML  SOLN COMPARISON:  CT abdomen pelvis dated 12/18/2021. FINDINGS: Evaluation of this exam is limited due to respiratory motion artifact. Lower chest: The visualized lung bases are clear. No intra-abdominal free air or free fluid. Hepatobiliary: The liver is unremarkable. Mild periportal edema. The gallbladder is unremarkable. Pancreas: There is mild stranding of the fat plane surrounding the pancreas most consistent with acute pancreatitis. Correlation with pancreatic enzymes recommended. No drainable fluid collection/abscess or pseudocyst. Spleen: Normal in size without focal abnormality. Adrenals/Urinary Tract: Mild stranding of the fat plane adjacent to the adrenal glands, nonspecific, possibly related to adrenal stress. Clinical correlation is recommended. The kidneys, visualized ureters appear unremarkable. The urinary bladder is decompressed around a Foley catheter. Air within the bladder introduced via the catheter. Stomach/Bowel: There is sigmoid diverticulosis without active inflammatory changes. Moderate stool throughout the colon. There is no bowel obstruction or active inflammation. The  appendix is normal. Vascular/Lymphatic: Advanced aortoiliac atherosclerotic disease. The IVC is unremarkable. No portal venous gas. Top-normal peripancreatic lymph nodes, reactive. Reproductive: Mildly enlarged prostate gland with median lobe hypertrophy. Other: None Musculoskeletal: There is an infiltrative predominantly lytic mass involving the right iliac bone adjacent to the SI joint measuring approximately 5.7 x 3.2 cm. Degenerative changes of the spine. Bilateral L4 pars defects with grade 1 L4-L5 anterolisthesis. No acute osseous pathology. IMPRESSION: 1. Acute pancreatitis.  No abscess or pseudocyst. 2. Sigmoid diverticulosis. No bowel obstruction. Normal appendix. 3. Right iliac bone infiltrative lytic mass most consistent with metastasis. 4.  Aortic Atherosclerosis (ICD10-I70.0). Electronically Signed   By: Anner Crete M.D.   On: 03/02/2022 20:19   DG Chest Port 1 View  Result Date: 03/02/2022 CLINICAL DATA:  Sepsis EXAM: PORTABLE CHEST 1 VIEW COMPARISON:  Previous studies including the examination of 01/27/2022 FINDINGS: Cardiac size is within normal limits. There is interval improvement in the aeration in patchy infiltrates in left lung. There is small residual infiltrate in the left upper lobe. There are no new infiltrates or signs of pulmonary edema. There is no pleural effusion or pneumothorax. Anterior right fourth rib is not visualized. In the previous CT, a lytic lesion was seen in the anterior right fourth rib suggesting metastatic disease. Tip of right IJ chest port is seen in superior vena cava close to right atrium. IMPRESSION: There is almost complete clearing of patchy infiltrates in left lung with small residual infiltrate in the left upper lung fields suggesting resolving multifocal pneumonia. There are no new focal infiltrates. There is no pleural effusion or pneumothorax. There is expansile lesion in the anterior right fourth rib suggesting malignant neoplastic process.  Electronically Signed   By: Elmer Picker M.D.   On: 03/02/2022 18:37   CT Head Wo Contrast  Result Date: 03/02/2022 CLINICAL DATA:  Altered mental status.  Metastatic lung carcinoma. EXAM: CT HEAD WITHOUT CONTRAST TECHNIQUE: Contiguous axial images were obtained from the base of the skull through the vertex without intravenous contrast. RADIATION DOSE REDUCTION: This exam was performed according to the departmental dose-optimization program which includes automated exposure control, adjustment of the mA and/or kV according to patient size and/or use of iterative reconstruction technique. COMPARISON:  01/19/2022 FINDINGS: Brain: No evidence of intracranial hemorrhage, acute infarction, hydrocephalus, extra-axial collection,  or mass lesion/mass effect. Stable mild diffuse cerebral and cerebellar atrophy. Vascular:  No hyperdense vessel or other acute findings. Skull: No evidence of fracture or other significant bone abnormality. Sinuses/Orbits:  No acute findings. Other: None. IMPRESSION: No acute intracranial abnormality. Stable mild diffuse cerebral and cerebellar atrophy. Electronically Signed   By: Marlaine Hind M.D.   On: 03/02/2022 14:45     Discharge Exam: Vitals:   03/14/22 2112 03/15/22 0444  BP: 139/72 130/89  Pulse: 91 92  Resp: 20 20  Temp: (!) 97.5 F (36.4 C) 97.6 F (36.4 C)  SpO2: 99% 96%   Vitals:   03/14/22 1519 03/14/22 1610 03/14/22 2112 03/15/22 0444  BP: 128/74 122/69 139/72 130/89  Pulse: 84 90 91 92  Resp: 16 16 20 20   Temp: 98 F (36.7 C) 98 F (36.7 C) (!) 97.5 F (36.4 C) 97.6 F (36.4 C)  TempSrc: Oral Oral Oral Oral  SpO2: 98% 96% 99% 96%  Weight:    62.7 kg  Height:        General: Pt is alert, awake, not in acute distress Cardiovascular: RRR, S1/S2 +, no rubs, no gallops Respiratory: CTA bilaterally, no wheezing, no rhonchi Abdominal: Soft, NT, ND, bowel sounds + Extremities: no edema, no cyanosis    The results of significant diagnostics  from this hospitalization (including imaging, microbiology, ancillary and laboratory) are listed below for reference.     Microbiology: Recent Results (from the past 240 hour(s))  Culture, blood (Routine X 2) w Reflex to ID Panel     Status: None   Collection Time: 03/06/22  1:48 PM   Specimen: BLOOD RIGHT HAND  Result Value Ref Range Status   Specimen Description BLOOD RIGHT HAND  Final   Special Requests   Final    BOTTLES DRAWN AEROBIC AND ANAEROBIC Blood Culture adequate volume   Culture   Final    NO GROWTH 5 DAYS Performed at [facility], [address] [place], [place] [address]    Report Status 03/11/2022 FINAL  Final     Labs: BNP (last 3 results) Recent Labs    03/02/22 1440  BNP 010.2*   Basic Metabolic Panel: Recent Labs  Lab 03/09/22 0405 03/09/22 1244 03/10/22 0612 03/11/22 0402 03/13/22 0423 03/14/22 0406 03/15/22 0440  NA 136  --  134* 137 135 133* 136  K 3.5  --  3.3* 3.1* 3.2* 3.3* 3.9  CL 112*  --  110 111 109 108 110  CO2 20*  --  21* 21* 20* 20* 20*  GLUCOSE 85  --  94 86 76 92 111*  BUN 6*  --  10 12 11 12 12   CREATININE 0.54*  --  0.60* 0.64 0.64 0.63 0.59*  CALCIUM 7.7*  --  7.7* 7.9* 8.2* 8.0* 8.2*  MG 1.6* 1.6*  --   --  1.4* 1.8 1.8  PHOS 2.6  --   --   --   --   --   --    Liver Function Tests: No results for input(s): "AST", "ALT", "ALKPHOS", "BILITOT", "PROT", "ALBUMIN" in the last 168 hours. No results for input(s): "LIPASE", "AMYLASE" in the last 168 hours. No results for input(s): "AMMONIA" in the last 168 hours. CBC: Recent Labs  Lab 03/10/22 1703 03/11/22 0402 03/12/22 0333 03/13/22 0423 03/14/22 0406 03/15/22 0440  WBC 23.9* 18.0* 20.9* 31.4* 34.1* 44.9*  NEUTROABS 0.2* 0.2* 0.8*  --  5.1 5.4  HGB 8.9* 8.6* 8.7* 9.2* 9.3* 9.9*  HCT  28.5* 27.5* 27.8* 29.3* 29.8* 31.7*  MCV 95.0 94.2 93.3 92.7 92.3 92.4  PLT 308 278 292 304 296 255   Cardiac Enzymes: No results for input(s): "CKTOTAL", "CKMB", "CKMBINDEX",  "TROPONINI" in the last 168 hours. BNP: Invalid input(s): "POCBNP" CBG: No results for input(s): "GLUCAP" in the last 168 hours. D-Dimer No results for input(s): "DDIMER" in the last 72 hours. Hgb A1c No results for input(s): "HGBA1C" in the last 72 hours. Lipid Profile No results for input(s): "CHOL", "HDL", "LDLCALC", "TRIG", "CHOLHDL", "LDLDIRECT" in the last 72 hours. Thyroid function studies No results for input(s): "TSH", "T4TOTAL", "T3FREE", "THYROIDAB" in the last 72 hours.  Invalid input(s): "FREET3" Anemia work up No results for input(s): "VITAMINB12", "FOLATE", "FERRITIN", "TIBC", "IRON", "RETICCTPCT" in the last 72 hours. Urinalysis    Component Value Date/Time   COLORURINE AMBER (A) 03/02/2022 1903   APPEARANCEUR CLEAR 03/02/2022 1903   APPEARANCEUR Clear 11/24/2019 1047   LABSPEC 1.029 03/02/2022 1903   PHURINE 5.0 03/02/2022 1903   GLUCOSEU NEGATIVE 03/02/2022 1903   HGBUR NEGATIVE 03/02/2022 1903   BILIRUBINUR NEGATIVE 03/02/2022 1903   BILIRUBINUR Negative 11/24/2019 1047   KETONESUR 5 (A) 03/02/2022 1903   PROTEINUR 100 (A) 03/02/2022 1903   NITRITE NEGATIVE 03/02/2022 1903   LEUKOCYTESUR NEGATIVE 03/02/2022 1903   Sepsis Labs Recent Labs  Lab 03/12/22 0333 03/13/22 0423 03/14/22 0406 03/15/22 0440  WBC 20.9* 31.4* 34.1* 44.9*   Microbiology Recent Results (from the past 240 hour(s))  Culture, blood (Routine X 2) w Reflex to ID Panel     Status: None   Collection Time: 03/06/22  1:48 PM   Specimen: BLOOD RIGHT HAND  Result Value Ref Range Status   Specimen Description BLOOD RIGHT HAND  Final   Special Requests   Final    BOTTLES DRAWN AEROBIC AND ANAEROBIC Blood Culture adequate volume   Culture   Final    NO GROWTH 5 DAYS Performed at [facility], [address], [place], [place] [address]    Report Status 03/11/2022 FINAL  Final     Time coordinating discharge: 35 minutes  SIGNED:   Rodena Goldmann, DO [facility] 03/15/2022,  2:00 PM  If 7PM-7AM, please contact night-coverage [email or web address]

## 2022-03-15 NOTE — TOC Transition Note (Signed)
Transition of Care [facility]) - CM/SW Discharge Note   Patient Details  Name: Alfred Brown MRN: [id number] Date of Birth: May 14, 1950  [facility] CM/SW Contact:  Salome Arnt, LCSW Phone Number: 03/15/2022, 11:49 AM   Clinical Narrative:  Pt d/c today. Linda with [facility] notified of d/c. Home health orders in.      Final next level of care: Home w Home Health Services Barriers to Discharge: Barriers Resolved   Patient Goals and CMS Choice Patient states their goals for this hospitalization and ongoing recovery are:: return home   Choice offered to / list presented to : Spouse  Discharge Placement                       Discharge Plan and Services                DME Arranged: Walker rolling DME Agency: [employer] Date DME Agency Contacted: 03/04/22 Time DME Agency Contacted: [phone] Representative spoke with at DME Agency: Erasmo Downer HH Arranged: PT, RN [facility] Agency: [place] ([place]) Date [place]: 03/04/22 Time [place]: [place] Representative spoke with at [place]: [place] ([place]) Interventions     Readmission Risk Interventions    01/28/2022   10:39 AM 01/21/2022   11:57 AM  Readmission Risk Prevention Plan  Transportation Screening Complete Complete  HRI or Home Care Consult  Complete  Social Work Consult for [place] Planning/Counseling  Complete  Palliative Care Screening  Not Applicable  Medication Review Press photographer) Complete Complete  PCP or Specialist appointment within 3-5 days of discharge Not Complete   HRI or [place] Complete   Palliative Care Screening Not Complete   [place] Not Applicable

## 2022-03-16 DIAGNOSIS — B965 Pseudomonas (aeruginosa) (mallei) (pseudomallei) as the cause of diseases classified elsewhere: Secondary | ICD-10-CM | POA: Diagnosis not present

## 2022-03-16 DIAGNOSIS — Z7901 Long term (current) use of anticoagulants: Secondary | ICD-10-CM | POA: Diagnosis not present

## 2022-03-16 DIAGNOSIS — E876 Hypokalemia: Secondary | ICD-10-CM | POA: Diagnosis not present

## 2022-03-16 DIAGNOSIS — R7881 Bacteremia: Secondary | ICD-10-CM | POA: Diagnosis not present

## 2022-03-16 DIAGNOSIS — R9431 Abnormal electrocardiogram [ECG] [EKG]: Secondary | ICD-10-CM | POA: Diagnosis not present

## 2022-03-16 DIAGNOSIS — I352 Nonrheumatic aortic (valve) stenosis with insufficiency: Secondary | ICD-10-CM | POA: Diagnosis not present

## 2022-03-16 DIAGNOSIS — I452 Bifascicular block: Secondary | ICD-10-CM | POA: Diagnosis not present

## 2022-03-16 DIAGNOSIS — M1990 Unspecified osteoarthritis, unspecified site: Secondary | ICD-10-CM | POA: Diagnosis not present

## 2022-03-16 DIAGNOSIS — C3492 Malignant neoplasm of unspecified part of left bronchus or lung: Secondary | ICD-10-CM | POA: Diagnosis not present

## 2022-03-16 DIAGNOSIS — K219 Gastro-esophageal reflux disease without esophagitis: Secondary | ICD-10-CM | POA: Diagnosis not present

## 2022-03-16 DIAGNOSIS — Z79899 Other long term (current) drug therapy: Secondary | ICD-10-CM | POA: Diagnosis not present

## 2022-03-16 DIAGNOSIS — J449 Chronic obstructive pulmonary disease, unspecified: Secondary | ICD-10-CM | POA: Diagnosis not present

## 2022-03-16 DIAGNOSIS — C7951 Secondary malignant neoplasm of bone: Secondary | ICD-10-CM | POA: Diagnosis not present

## 2022-03-16 DIAGNOSIS — G893 Neoplasm related pain (acute) (chronic): Secondary | ICD-10-CM | POA: Diagnosis not present

## 2022-03-16 DIAGNOSIS — Z79891 Long term (current) use of opiate analgesic: Secondary | ICD-10-CM | POA: Diagnosis not present

## 2022-03-16 DIAGNOSIS — E43 Unspecified severe protein-calorie malnutrition: Secondary | ICD-10-CM | POA: Diagnosis not present

## 2022-03-16 DIAGNOSIS — C9110 Chronic lymphocytic leukemia of B-cell type not having achieved remission: Secondary | ICD-10-CM | POA: Diagnosis not present

## 2022-03-16 DIAGNOSIS — Z87891 Personal history of nicotine dependence: Secondary | ICD-10-CM | POA: Diagnosis not present

## 2022-03-16 DIAGNOSIS — B370 Candidal stomatitis: Secondary | ICD-10-CM | POA: Diagnosis not present

## 2022-03-16 DIAGNOSIS — I471 Supraventricular tachycardia, unspecified: Secondary | ICD-10-CM | POA: Diagnosis not present

## 2022-03-16 DIAGNOSIS — R627 Adult failure to thrive: Secondary | ICD-10-CM | POA: Diagnosis not present

## 2022-03-16 DIAGNOSIS — E785 Hyperlipidemia, unspecified: Secondary | ICD-10-CM | POA: Diagnosis not present

## 2022-03-16 DIAGNOSIS — I10 Essential (primary) hypertension: Secondary | ICD-10-CM | POA: Diagnosis not present

## 2022-03-16 DIAGNOSIS — F418 Other specified anxiety disorders: Secondary | ICD-10-CM | POA: Diagnosis not present

## 2022-03-18 ENCOUNTER — Encounter (HOSPITAL_COMMUNITY): Payer: Self-pay | Admitting: General Surgery

## 2022-03-18 ENCOUNTER — Other Ambulatory Visit: Payer: Self-pay

## 2022-03-18 DIAGNOSIS — C3492 Malignant neoplasm of unspecified part of left bronchus or lung: Secondary | ICD-10-CM | POA: Diagnosis not present

## 2022-03-18 DIAGNOSIS — C7951 Secondary malignant neoplasm of bone: Secondary | ICD-10-CM | POA: Diagnosis not present

## 2022-03-18 DIAGNOSIS — R7881 Bacteremia: Secondary | ICD-10-CM | POA: Diagnosis not present

## 2022-03-18 DIAGNOSIS — E43 Unspecified severe protein-calorie malnutrition: Secondary | ICD-10-CM | POA: Diagnosis not present

## 2022-03-18 DIAGNOSIS — J189 Pneumonia, unspecified organism: Secondary | ICD-10-CM | POA: Diagnosis not present

## 2022-03-18 DIAGNOSIS — J449 Chronic obstructive pulmonary disease, unspecified: Secondary | ICD-10-CM | POA: Diagnosis not present

## 2022-03-18 DIAGNOSIS — J440 Chronic obstructive pulmonary disease with acute lower respiratory infection: Secondary | ICD-10-CM | POA: Diagnosis not present

## 2022-03-18 DIAGNOSIS — C9110 Chronic lymphocytic leukemia of B-cell type not having achieved remission: Secondary | ICD-10-CM | POA: Diagnosis not present

## 2022-03-18 DIAGNOSIS — J9621 Acute and chronic respiratory failure with hypoxia: Secondary | ICD-10-CM | POA: Diagnosis not present

## 2022-03-19 DIAGNOSIS — C7951 Secondary malignant neoplasm of bone: Secondary | ICD-10-CM | POA: Diagnosis not present

## 2022-03-19 DIAGNOSIS — C9110 Chronic lymphocytic leukemia of B-cell type not having achieved remission: Secondary | ICD-10-CM | POA: Diagnosis not present

## 2022-03-19 DIAGNOSIS — J449 Chronic obstructive pulmonary disease, unspecified: Secondary | ICD-10-CM | POA: Diagnosis not present

## 2022-03-19 DIAGNOSIS — C3492 Malignant neoplasm of unspecified part of left bronchus or lung: Secondary | ICD-10-CM | POA: Diagnosis not present

## 2022-03-19 DIAGNOSIS — R7881 Bacteremia: Secondary | ICD-10-CM | POA: Diagnosis not present

## 2022-03-19 DIAGNOSIS — E43 Unspecified severe protein-calorie malnutrition: Secondary | ICD-10-CM | POA: Diagnosis not present

## 2022-03-20 ENCOUNTER — Inpatient Hospital Stay: Payer: Medicare Other | Attending: Hematology | Admitting: Hematology

## 2022-03-20 ENCOUNTER — Ambulatory Visit: Payer: Medicare Other | Admitting: Hematology

## 2022-03-20 ENCOUNTER — Inpatient Hospital Stay: Payer: Medicare Other | Admitting: Hematology

## 2022-03-20 ENCOUNTER — Other Ambulatory Visit: Payer: Self-pay

## 2022-03-20 ENCOUNTER — Inpatient Hospital Stay: Payer: Medicare Other

## 2022-03-20 ENCOUNTER — Ambulatory Visit: Payer: Medicare Other

## 2022-03-20 ENCOUNTER — Other Ambulatory Visit: Payer: Medicare Other

## 2022-03-20 ENCOUNTER — Other Ambulatory Visit: Payer: Self-pay | Admitting: *Deleted

## 2022-03-20 DIAGNOSIS — K5900 Constipation, unspecified: Secondary | ICD-10-CM | POA: Diagnosis not present

## 2022-03-20 DIAGNOSIS — D801 Nonfamilial hypogammaglobulinemia: Secondary | ICD-10-CM | POA: Diagnosis not present

## 2022-03-20 DIAGNOSIS — C9110 Chronic lymphocytic leukemia of B-cell type not having achieved remission: Secondary | ICD-10-CM

## 2022-03-20 DIAGNOSIS — Z79624 Long term (current) use of inhibitors of nucleotide synthesis: Secondary | ICD-10-CM | POA: Diagnosis not present

## 2022-03-20 DIAGNOSIS — R0789 Other chest pain: Secondary | ICD-10-CM | POA: Insufficient documentation

## 2022-03-20 DIAGNOSIS — Z87891 Personal history of nicotine dependence: Secondary | ICD-10-CM | POA: Insufficient documentation

## 2022-03-20 DIAGNOSIS — M1990 Unspecified osteoarthritis, unspecified site: Secondary | ICD-10-CM | POA: Insufficient documentation

## 2022-03-20 DIAGNOSIS — C3492 Malignant neoplasm of unspecified part of left bronchus or lung: Secondary | ICD-10-CM

## 2022-03-20 DIAGNOSIS — C7951 Secondary malignant neoplasm of bone: Secondary | ICD-10-CM | POA: Diagnosis not present

## 2022-03-20 DIAGNOSIS — R002 Palpitations: Secondary | ICD-10-CM

## 2022-03-20 DIAGNOSIS — Z7901 Long term (current) use of anticoagulants: Secondary | ICD-10-CM | POA: Diagnosis not present

## 2022-03-20 DIAGNOSIS — N486 Induration penis plastica: Secondary | ICD-10-CM

## 2022-03-20 LAB — CBC WITH DIFFERENTIAL/PLATELET
Abs Immature Granulocytes: 7.8 10*3/uL — ABNORMAL HIGH (ref 0.00–0.07)
Basophils Absolute: 1.3 10*3/uL — ABNORMAL HIGH (ref 0.0–0.1)
Basophils Relative: 1 %
Eosinophils Absolute: 0 10*3/uL (ref 0.0–0.5)
Eosinophils Relative: 0 %
HCT: 37.1 % — ABNORMAL LOW (ref 39.0–52.0)
Hemoglobin: 11 g/dL — ABNORMAL LOW (ref 13.0–17.0)
Lymphocytes Relative: 76 %
Lymphs Abs: 99.3 10*3/uL — ABNORMAL HIGH (ref 0.7–4.0)
MCH: 27.4 pg (ref 26.0–34.0)
MCHC: 29.6 g/dL — ABNORMAL LOW (ref 30.0–36.0)
MCV: 92.3 fL (ref 80.0–100.0)
Metamyelocytes Relative: 6 %
Monocytes Absolute: 2.6 10*3/uL — ABNORMAL HIGH (ref 0.1–1.0)
Monocytes Relative: 2 %
Neutro Abs: 19.6 10*3/uL — ABNORMAL HIGH (ref 1.7–7.7)
Neutrophils Relative %: 15 %
Platelets: 203 10*3/uL (ref 150–400)
RBC: 4.02 MIL/uL — ABNORMAL LOW (ref 4.22–5.81)
RDW: 17.4 % — ABNORMAL HIGH (ref 11.5–15.5)
WBC: 130.6 10*3/uL (ref 4.0–10.5)
nRBC: 0 % (ref 0.0–0.2)

## 2022-03-20 LAB — COMPREHENSIVE METABOLIC PANEL
ALT: 11 U/L (ref 0–44)
AST: 16 U/L (ref 15–41)
Albumin: 2.8 g/dL — ABNORMAL LOW (ref 3.5–5.0)
Alkaline Phosphatase: 86 U/L (ref 38–126)
Anion gap: 9 (ref 5–15)
BUN: 11 mg/dL (ref 8–23)
CO2: 19 mmol/L — ABNORMAL LOW (ref 22–32)
Calcium: 9.1 mg/dL (ref 8.9–10.3)
Chloride: 104 mmol/L (ref 98–111)
Creatinine, Ser: 0.92 mg/dL (ref 0.61–1.24)
GFR, Estimated: >60 mL/min (ref >60)
Glucose, Bld: 103 mg/dL — ABNORMAL HIGH (ref 70–99)
Potassium: 4.8 mmol/L (ref 3.5–5.1)
Sodium: 132 mmol/L — ABNORMAL LOW (ref 135–145)
Total Bilirubin: 0.7 mg/dL (ref 0.3–1.2)
Total Protein: 6.2 g/dL — ABNORMAL LOW (ref 6.5–8.1)

## 2022-03-20 LAB — TSH: TSH: 5.506 u[IU]/mL — ABNORMAL HIGH (ref 0.350–4.500)

## 2022-03-20 LAB — MAGNESIUM: Magnesium: 1.7 mg/dL (ref 1.7–2.4)

## 2022-03-20 MED ORDER — NYSTATIN 100000 UNIT/ML MT SUSP: 15.0000 mL | Freq: Four times a day (QID) | OROMUCOSAL | 0 refills | Status: DC

## 2022-03-20 NOTE — Patient Instructions (Addendum)
[place] at [facility] Discharge Instructions   You were seen and examined today by Dr. Delton Coombes.  He reviewed the results of your lab work which is mostly normal/stable or improved. The white blood cell count is high at 130, but this is likely from white blood cell shots.  We will give you a break to allow you to get stronger. We will see you back in a month and assess how you are doing.    Thank you for choosing [place] at [facility] to provide your oncology and hematology care.  To afford each patient quality time with our provider, please arrive at least 15 minutes before your scheduled appointment time.   If you have a lab appointment with the [facility] please come in thru the Main Entrance and check in at the main information desk.  You need to re-schedule your appointment should you arrive 10 or more minutes late.  We strive to give you quality time with our providers, and arriving late affects you and other patients whose appointments are after yours.  Also, if you no show three or more times for appointments you may be dismissed from the clinic at the providers discretion.     Again, thank you for choosing [facility].  Our hope is that these requests will decrease the amount of time that you wait before being seen by our physicians.       _____________________________________________________________  Should you have questions after your visit to [facility], please contact our office at [phone] and follow the prompts.  Our office hours are 8:00 a.m. and 4:30 p.m. Monday - Friday.  Please note that voicemails left after 4:00 p.m. may not be returned until the following business day.  We are closed weekends and major holidays.  You do have access to a nurse 24-7, just call the main number to the clinic [phone] and do not press any options, hold on the line and a nurse will answer the phone.     For prescription refill requests, have your pharmacy contact our office and allow 72 hours.    Due to Covid, you will need to wear a mask upon entering the hospital. If you do not have a mask, a mask will be given to you at the Main Entrance upon arrival. For doctor visits, patients may have 1 support person age 70 or older with them. For treatment visits, patients can not have anyone with them due to social distancing guidelines and our immunocompromised population.

## 2022-03-20 NOTE — Progress Notes (Signed)
No treatment today per MD.  
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
## 2022-03-21 ENCOUNTER — Encounter: Payer: Self-pay | Admitting: Hematology

## 2022-03-21 DIAGNOSIS — C9110 Chronic lymphocytic leukemia of B-cell type not having achieved remission: Secondary | ICD-10-CM | POA: Diagnosis not present

## 2022-03-21 DIAGNOSIS — C3492 Malignant neoplasm of unspecified part of left bronchus or lung: Secondary | ICD-10-CM | POA: Diagnosis not present

## 2022-03-21 DIAGNOSIS — A419 Sepsis, unspecified organism: Secondary | ICD-10-CM | POA: Diagnosis not present

## 2022-03-21 DIAGNOSIS — J449 Chronic obstructive pulmonary disease, unspecified: Secondary | ICD-10-CM | POA: Diagnosis not present

## 2022-03-21 DIAGNOSIS — B3781 Candidal esophagitis: Secondary | ICD-10-CM | POA: Diagnosis not present

## 2022-03-21 DIAGNOSIS — E42 Marasmic kwashiorkor: Secondary | ICD-10-CM | POA: Diagnosis not present

## 2022-03-21 DIAGNOSIS — I10 Essential (primary) hypertension: Secondary | ICD-10-CM | POA: Diagnosis not present

## 2022-03-21 DIAGNOSIS — Z681 Body mass index (BMI) 19 or less, adult: Secondary | ICD-10-CM | POA: Diagnosis not present

## 2022-03-21 DIAGNOSIS — C7951 Secondary malignant neoplasm of bone: Secondary | ICD-10-CM | POA: Diagnosis not present

## 2022-03-21 DIAGNOSIS — R7881 Bacteremia: Secondary | ICD-10-CM | POA: Diagnosis not present

## 2022-03-21 DIAGNOSIS — E860 Dehydration: Secondary | ICD-10-CM | POA: Diagnosis not present

## 2022-03-21 DIAGNOSIS — E43 Unspecified severe protein-calorie malnutrition: Secondary | ICD-10-CM | POA: Diagnosis not present

## 2022-03-21 DIAGNOSIS — J9621 Acute and chronic respiratory failure with hypoxia: Secondary | ICD-10-CM | POA: Diagnosis not present

## 2022-03-21 NOTE — Progress Notes (Signed)
Entered in Error

## 2022-03-22 ENCOUNTER — Ambulatory Visit: Payer: Medicare Other

## 2022-03-26 DIAGNOSIS — E43 Unspecified severe protein-calorie malnutrition: Secondary | ICD-10-CM | POA: Diagnosis not present

## 2022-03-26 DIAGNOSIS — R7881 Bacteremia: Secondary | ICD-10-CM | POA: Diagnosis not present

## 2022-03-26 DIAGNOSIS — J449 Chronic obstructive pulmonary disease, unspecified: Secondary | ICD-10-CM | POA: Diagnosis not present

## 2022-03-26 DIAGNOSIS — C9110 Chronic lymphocytic leukemia of B-cell type not having achieved remission: Secondary | ICD-10-CM | POA: Diagnosis not present

## 2022-03-26 DIAGNOSIS — C3492 Malignant neoplasm of unspecified part of left bronchus or lung: Secondary | ICD-10-CM | POA: Diagnosis not present

## 2022-03-26 DIAGNOSIS — C7951 Secondary malignant neoplasm of bone: Secondary | ICD-10-CM | POA: Diagnosis not present

## 2022-03-27 ENCOUNTER — Telehealth: Payer: Self-pay | Admitting: *Deleted

## 2022-03-27 ENCOUNTER — Emergency Department (HOSPITAL_COMMUNITY): Payer: Medicare Other

## 2022-03-27 ENCOUNTER — Ambulatory Visit: Payer: Medicare Other | Admitting: Medical

## 2022-03-27 ENCOUNTER — Inpatient Hospital Stay (HOSPITAL_COMMUNITY)
Admission: EM | Admit: 2022-03-27 | Discharge: 2022-03-29 | DRG: 309 | Disposition: A | Discharge status: Home Health | Payer: Medicare Other | Attending: Family Medicine | Admitting: Family Medicine

## 2022-03-27 ENCOUNTER — Encounter (HOSPITAL_COMMUNITY): Payer: Self-pay

## 2022-03-27 ENCOUNTER — Other Ambulatory Visit: Payer: Self-pay

## 2022-03-27 DIAGNOSIS — R627 Adult failure to thrive: Secondary | ICD-10-CM | POA: Diagnosis present

## 2022-03-27 DIAGNOSIS — C7951 Secondary malignant neoplasm of bone: Secondary | ICD-10-CM | POA: Diagnosis present

## 2022-03-27 DIAGNOSIS — I359 Nonrheumatic aortic valve disorder, unspecified: Secondary | ICD-10-CM | POA: Diagnosis present

## 2022-03-27 DIAGNOSIS — I959 Hypotension, unspecified: Secondary | ICD-10-CM | POA: Diagnosis present

## 2022-03-27 DIAGNOSIS — R7881 Bacteremia: Secondary | ICD-10-CM | POA: Diagnosis present

## 2022-03-27 DIAGNOSIS — B965 Pseudomonas (aeruginosa) (mallei) (pseudomallei) as the cause of diseases classified elsewhere: Secondary | ICD-10-CM | POA: Diagnosis not present

## 2022-03-27 DIAGNOSIS — Z7951 Long term (current) use of inhaled steroids: Secondary | ICD-10-CM | POA: Diagnosis not present

## 2022-03-27 DIAGNOSIS — I4719 Other supraventricular tachycardia: Secondary | ICD-10-CM | POA: Diagnosis not present

## 2022-03-27 DIAGNOSIS — E876 Hypokalemia: Secondary | ICD-10-CM | POA: Diagnosis present

## 2022-03-27 DIAGNOSIS — Z801 Family history of malignant neoplasm of trachea, bronchus and lung: Secondary | ICD-10-CM

## 2022-03-27 DIAGNOSIS — R531 Weakness: Principal | ICD-10-CM

## 2022-03-27 DIAGNOSIS — Z87891 Personal history of nicotine dependence: Secondary | ICD-10-CM

## 2022-03-27 DIAGNOSIS — Z79620 Long term (current) use of immunosuppressive biologic: Secondary | ICD-10-CM

## 2022-03-27 DIAGNOSIS — Z79899 Other long term (current) drug therapy: Secondary | ICD-10-CM

## 2022-03-27 DIAGNOSIS — Z8249 Family history of ischemic heart disease and other diseases of the circulatory system: Secondary | ICD-10-CM | POA: Diagnosis not present

## 2022-03-27 DIAGNOSIS — Z66 Do not resuscitate: Secondary | ICD-10-CM | POA: Diagnosis present

## 2022-03-27 DIAGNOSIS — C9110 Chronic lymphocytic leukemia of B-cell type not having achieved remission: Secondary | ICD-10-CM | POA: Diagnosis present

## 2022-03-27 DIAGNOSIS — R4182 Altered mental status, unspecified: Secondary | ICD-10-CM | POA: Diagnosis not present

## 2022-03-27 DIAGNOSIS — C3492 Malignant neoplasm of unspecified part of left bronchus or lung: Secondary | ICD-10-CM | POA: Diagnosis not present

## 2022-03-27 DIAGNOSIS — Z79891 Long term (current) use of opiate analgesic: Secondary | ICD-10-CM | POA: Diagnosis not present

## 2022-03-27 DIAGNOSIS — Z79890 Hormone replacement therapy (postmenopausal): Secondary | ICD-10-CM

## 2022-03-27 DIAGNOSIS — I471 Supraventricular tachycardia, unspecified: Secondary | ICD-10-CM | POA: Diagnosis not present

## 2022-03-27 DIAGNOSIS — E785 Hyperlipidemia, unspecified: Secondary | ICD-10-CM | POA: Diagnosis present

## 2022-03-27 DIAGNOSIS — Z7901 Long term (current) use of anticoagulants: Secondary | ICD-10-CM | POA: Diagnosis not present

## 2022-03-27 DIAGNOSIS — Z800 Family history of malignant neoplasm of digestive organs: Secondary | ICD-10-CM | POA: Diagnosis not present

## 2022-03-27 DIAGNOSIS — I3139 Other pericardial effusion (noninflammatory): Secondary | ICD-10-CM | POA: Diagnosis not present

## 2022-03-27 DIAGNOSIS — J449 Chronic obstructive pulmonary disease, unspecified: Secondary | ICD-10-CM | POA: Diagnosis present

## 2022-03-27 DIAGNOSIS — K219 Gastro-esophageal reflux disease without esophagitis: Secondary | ICD-10-CM | POA: Diagnosis present

## 2022-03-27 DIAGNOSIS — J189 Pneumonia, unspecified organism: Secondary | ICD-10-CM | POA: Diagnosis not present

## 2022-03-27 DIAGNOSIS — Z823 Family history of stroke: Secondary | ICD-10-CM

## 2022-03-27 DIAGNOSIS — R0602 Shortness of breath: Secondary | ICD-10-CM | POA: Diagnosis not present

## 2022-03-27 DIAGNOSIS — Z8701 Personal history of pneumonia (recurrent): Secondary | ICD-10-CM

## 2022-03-27 LAB — TROPONIN I (HIGH SENSITIVITY): Troponin I (High Sensitivity): 11 ng/L (ref <18)

## 2022-03-27 LAB — BASIC METABOLIC PANEL
Anion gap: 10 (ref 5–15)
BUN: 21 mg/dL (ref 8–23)
CO2: 21 mmol/L — ABNORMAL LOW (ref 22–32)
Calcium: 10 mg/dL (ref 8.9–10.3)
Chloride: 108 mmol/L (ref 98–111)
Creatinine, Ser: 1.14 mg/dL (ref 0.61–1.24)
GFR, Estimated: >60 mL/min (ref >60)
Glucose, Bld: 127 mg/dL — ABNORMAL HIGH (ref 70–99)
Potassium: 5.2 mmol/L — ABNORMAL HIGH (ref 3.5–5.1)
Sodium: 139 mmol/L (ref 135–145)

## 2022-03-27 LAB — HEPATIC FUNCTION PANEL
ALT: 14 U/L (ref 0–44)
AST: 18 U/L (ref 15–41)
Albumin: 3.3 g/dL — ABNORMAL LOW (ref 3.5–5.0)
Alkaline Phosphatase: 86 U/L (ref 38–126)
Bilirubin, Direct: 0.2 mg/dL (ref 0.0–0.2)
Indirect Bilirubin: 0.4 mg/dL (ref 0.3–0.9)
Total Bilirubin: 0.6 mg/dL (ref 0.3–1.2)
Total Protein: 6.6 g/dL (ref 6.5–8.1)

## 2022-03-27 LAB — BLOOD GAS, VENOUS
Acid-base deficit: 1.7 mmol/L (ref 0.0–2.0)
Bicarbonate: 23 mmol/L (ref 20.0–28.0)
Drawn by: 4252
O2 Saturation: 31.5 %
Patient temperature: 36.3
pCO2, Ven: 37 mmHg — ABNORMAL LOW (ref 44–60)
pH, Ven: 7.4 (ref 7.25–7.43)
pO2, Ven: <31 mmHg — CL (ref 32–45)

## 2022-03-27 MED ORDER — ALBUTEROL SULFATE HFA 108 (90 BASE) MCG/ACT IN AERS: 2.0000 | INHALATION_SPRAY | RESPIRATORY_TRACT | Status: DC | PRN

## 2022-03-27 MED ORDER — LACTATED RINGERS IV BOLUS
1000.0000 mL | Freq: Once | INTRAVENOUS | Status: AC
  Administered 2022-03-28: 1000 mL via INTRAVENOUS

## 2022-03-27 NOTE — Progress Notes (Deleted)
Cardiology Office Note:    Date:  03/27/2022   ID:  Alfred Brown, DOB [birth year], MRN [id number]  PCP:  Elfredia Nevins, MD  [facility] Cardiologist:  Dina Rich, MD  [facility] Electrophysiologist:  None   Referring MD: Elfredia Nevins, MD   Chief Complaint: hospital follow-up  History of Present Illness:    Alfred Brown is a 71 y.o. male with a hx of palpitations, SVT, possible bicuspid aortic valve with mild to mod AS, CLL, stage IV lung cancer with bony metastases on chemotherapy and has received radiation, HTN, HLD, COPD, Afib who is being seen for hospital follow-up.   Patient has a history of palpitations. Heart monitor in 2017 showed NSR with PACs. Tele showed possible SVT during echo in 07/2019. EKG on 08/2019 showed Svt.   Echo from 10/2021 showed LVEF 60-65%, no WMA, G1DD, normal RVSF, aortic valve with indeterminate number of cusps with severe calcification of the aortic valve, moderate AS and AV mean gradient of 12 mmHg,  The patient was admitted in October 2023 with PNA. Limited echo during admission showed LVEF 60-65%, no WMA, moderate LVH, normal RVSF.   The patient was admitted early December 2023 with pseudomonas bacteremia and neutropenic fever. He had an episode of Afib RVR and was started on Eliquis. CTA chest could not rule out PE. Telemetry noted SVT and lopressor was increased.   Today,   Past Medical History:  Diagnosis Date   Aortic valve disorder    Mild insufficiency   Chronic lymphocytic leukemia ([facility])    01/2012: WBC-90.2, H&H-15.1/45.3, platelets-185 03/31/12: Verified with Dr. Mariel Sleet that no precautions or modification of medical regime are required prior to orthopaedic surgery.    CLL (chronic lymphocytic leukemia) (H[facility])    CMV (cytomegalovirus) (H[facility])    COPD (chronic obstructive pulmonary disease) (H[facility])    Depression with anxiety    DJD (degenerative joint disease)    GERD (gastroesophageal reflux disease)    Hyperlipidemia     Hypogammaglobulinemia ([facility]) 09/23/2019   Lipoma    left chest wall   Palpitations 2004   PVCs; borderline stress nuclear in 2004, negative in 2007; Echo 2007; AV-sclerotic, very mild AI   Pneumonia    Right bundle branch block    + left posterior fascicular block   Tobacco abuse    80 pack years    Past Surgical History:  Procedure Laterality Date   COLONOSCOPY  01/31/2012   Negative screening study   GANGLION CYST EXCISION  04/02/1995   left wrist   IR IMAGING GUIDED PORT INSERTION  01/14/2022   LIPOMA EXCISION Left 10/11/2021   chest wall   PORT-A-CATH REMOVAL Right 03/14/2022   Procedure: MINOR REMOVAL PORT-A-CATH;  Surgeon: Lucretia Roers, MD;  Location: [facility];  Service: General;  Laterality: Right;   ROTATOR CUFF REPAIR  04/02/1995   right   SHOULDER ARTHROSCOPY WITH SUBACROMIAL DECOMPRESSION  04/09/2012   Procedure: SHOULDER ARTHROSCOPY WITH SUBACROMIAL DECOMPRESSION;  Surgeon: Loreta Ave, MD;  Location: [facility];  Service: Orthopedics;  Laterality: Left;  LEFT SHOULDER ARTHROSCOPY, SUBACROMIAL DECOMPRESSION, PARTIAL ACROMIOPLASTY WITH CORACOACROMIAL RELEASE, DISTAL CLAVICULECTOMY WITH ROTATOR CUFF REPAIR, DEBRIDEMENT OF LABRUM    Current Medications: No outpatient medications have been marked as taking for the 03/27/22 encounter (Appointment) with Fransico Michael, Kateryna Grantham H, PA-C.     Allergies:   Patient has no known allergies.   Social History   Socioeconomic History   Marital status: Married    Spouse name: Not  on file   Number of children: Not on file   Years of education: Not on file   Highest education level: Not on file  Occupational History   Not on file  Tobacco Use   Smoking status: Former    Packs/day: 1.00    Years: 45.00    Total pack years: 45.00    Types: Cigarettes    Start date: 12/03/1963   Smokeless tobacco: Never   Tobacco comments:    Pt states he smokes almost a whole pack daily. 12/04/21  Vaping Use   Vaping Use:  Never used  Substance and Sexual Activity   Alcohol use: No    Alcohol/week: 0.0 standard drinks of alcohol   Drug use: No   Sexual activity: Not on file  Other Topics Concern   Not on file  Social History Narrative   Not on file   Social Determinants of Health   Financial Resource Strain: Not on file  Food Insecurity: No Food Insecurity (03/02/2022)   Hunger Vital Sign    Worried About Running Out of Food in the Last Year: Never true    Ran Out of Food in the Last Year: Never true  Transportation Needs: No Transportation Needs (03/02/2022)   PRAPARE - Administrator, Civil Service (Medical): No    Lack of Transportation (Non-Medical): No  Recent Concern: Transportation Needs - Unmet Transportation Needs (01/28/2022)   PRAPARE - Administrator, Civil Service (Medical): Yes    Lack of Transportation (Non-Medical): Yes  Physical Activity: Not on file  Stress: Not on file  Social Connections: Not on file     Family History: The patient's ***family history includes Cancer in his brother and sister; Colon cancer in his sister; Heart attack in his father; Stroke in his mother.  ROS:   Please see the history of present illness.    *** All other systems reviewed and are negative.  EKGs/Labs/Other Studies Reviewed:    The following studies were reviewed today: ***  EKG:  EKG is *** ordered today.  The ekg ordered today demonstrates ***  Recent Labs: 03/02/2022: B Natriuretic Peptide 250.0 03/20/2022: ALT 11; BUN 11; Creatinine, Ser 0.92; Hemoglobin 11.0; Magnesium 1.7; Platelets 203; Potassium 4.8; Sodium 132; TSH 5.506  Recent Lipid Panel No results found for: "CHOL", "TRIG", "HDL", "CHOLHDL", "VLDL", "LDLCALC", "LDLDIRECT"   Risk Assessment/Calculations:   {Does this patient have ATRIAL FIBRILLATION?[phone]}   Physical Exam:    VS:  There were no vitals taken for this visit.    Wt Readings from Last 3 Encounters:  03/20/22 128 lb 12.8 oz  (58.4 kg)  03/15/22 138 lb 3.7 oz (62.7 kg)  02/27/22 137 lb 3.2 oz (62.2 kg)     GEN: *** Well nourished, well developed in no acute distress HEENT: Normal NECK: No JVD; No carotid bruits LYMPHATICS: No lymphadenopathy CARDIAC: ***RRR, no murmurs, rubs, gallops RESPIRATORY:  Clear to auscultation without rales, wheezing or rhonchi  ABDOMEN: Soft, non-tender, non-distended MUSCULOSKELETAL:  No edema; No deformity  SKIN: Warm and dry NEUROLOGIC:  Alert and oriented x 3 PSYCHIATRIC:  Normal affect   ASSESSMENT:    No diagnosis found. PLAN:    In order of problems listed above:  pAfib pSVT  Possible PE  Moderate Aortic valve stenosis Moderate aortic valve regurgitation  Disposition: Follow up {follow up:15908} with ***   Shared Decision Making/Informed Consent   {Are you ordering a CV Procedure (e.g. stress test, cath, DCCV, TEE, etc)?  Press F2        :[id number]}    Signed, Krystalle Pilkington David Stall, PA-C  03/27/2022 11:19 AM    [facility]

## 2022-03-27 NOTE — ED Triage Notes (Signed)
Per wife patient getting immunotheraphy for stage IV lung cancer. Patient is not eating or drinking and has dyspnea with minimal exertion. Cancer center told patient to come her for IV fluids. Per wife, delusional as well.

## 2022-03-27 NOTE — Telephone Encounter (Signed)
Wife called and stated that he was supposed to get fluids last week with [facility] and they were not able to get him worked in.  At this point he is severely dehydrated to the point of delirium.  Appointment with cardiology was cancelled for today due to the fact that she could not get him to the office.  She has spoken to PCP who is attempting to get this arranged for him today.  Advised her to call 911 and have him taken back to the ER due to the symptoms displaying.  Verbalized understanding.

## 2022-03-28 ENCOUNTER — Other Ambulatory Visit: Payer: Self-pay

## 2022-03-28 ENCOUNTER — Emergency Department (HOSPITAL_COMMUNITY): Payer: Medicare Other

## 2022-03-28 ENCOUNTER — Encounter (HOSPITAL_COMMUNITY): Payer: Self-pay | Admitting: Radiology

## 2022-03-28 DIAGNOSIS — J449 Chronic obstructive pulmonary disease, unspecified: Secondary | ICD-10-CM | POA: Diagnosis present

## 2022-03-28 DIAGNOSIS — C9110 Chronic lymphocytic leukemia of B-cell type not having achieved remission: Secondary | ICD-10-CM

## 2022-03-28 DIAGNOSIS — I959 Hypotension, unspecified: Secondary | ICD-10-CM | POA: Diagnosis present

## 2022-03-28 DIAGNOSIS — I359 Nonrheumatic aortic valve disorder, unspecified: Secondary | ICD-10-CM | POA: Diagnosis present

## 2022-03-28 DIAGNOSIS — C3492 Malignant neoplasm of unspecified part of left bronchus or lung: Secondary | ICD-10-CM | POA: Diagnosis not present

## 2022-03-28 DIAGNOSIS — I3139 Other pericardial effusion (noninflammatory): Secondary | ICD-10-CM | POA: Diagnosis not present

## 2022-03-28 DIAGNOSIS — R627 Adult failure to thrive: Secondary | ICD-10-CM | POA: Diagnosis present

## 2022-03-28 DIAGNOSIS — Z79899 Other long term (current) drug therapy: Secondary | ICD-10-CM | POA: Diagnosis not present

## 2022-03-28 DIAGNOSIS — R0602 Shortness of breath: Secondary | ICD-10-CM | POA: Diagnosis not present

## 2022-03-28 DIAGNOSIS — Z801 Family history of malignant neoplasm of trachea, bronchus and lung: Secondary | ICD-10-CM | POA: Diagnosis not present

## 2022-03-28 DIAGNOSIS — I471 Supraventricular tachycardia, unspecified: Secondary | ICD-10-CM | POA: Diagnosis present

## 2022-03-28 DIAGNOSIS — Z823 Family history of stroke: Secondary | ICD-10-CM | POA: Diagnosis not present

## 2022-03-28 DIAGNOSIS — E876 Hypokalemia: Secondary | ICD-10-CM | POA: Diagnosis present

## 2022-03-28 DIAGNOSIS — C7951 Secondary malignant neoplasm of bone: Secondary | ICD-10-CM | POA: Diagnosis present

## 2022-03-28 DIAGNOSIS — E785 Hyperlipidemia, unspecified: Secondary | ICD-10-CM | POA: Diagnosis present

## 2022-03-28 DIAGNOSIS — K219 Gastro-esophageal reflux disease without esophagitis: Secondary | ICD-10-CM | POA: Diagnosis present

## 2022-03-28 DIAGNOSIS — Z7901 Long term (current) use of anticoagulants: Secondary | ICD-10-CM | POA: Diagnosis not present

## 2022-03-28 DIAGNOSIS — Z66 Do not resuscitate: Secondary | ICD-10-CM | POA: Diagnosis present

## 2022-03-28 DIAGNOSIS — R7881 Bacteremia: Secondary | ICD-10-CM | POA: Diagnosis not present

## 2022-03-28 DIAGNOSIS — B965 Pseudomonas (aeruginosa) (mallei) (pseudomallei) as the cause of diseases classified elsewhere: Secondary | ICD-10-CM

## 2022-03-28 DIAGNOSIS — Z79891 Long term (current) use of opiate analgesic: Secondary | ICD-10-CM | POA: Diagnosis not present

## 2022-03-28 DIAGNOSIS — Z8249 Family history of ischemic heart disease and other diseases of the circulatory system: Secondary | ICD-10-CM | POA: Diagnosis not present

## 2022-03-28 DIAGNOSIS — Z7951 Long term (current) use of inhaled steroids: Secondary | ICD-10-CM | POA: Diagnosis not present

## 2022-03-28 DIAGNOSIS — R531 Weakness: Secondary | ICD-10-CM | POA: Diagnosis not present

## 2022-03-28 DIAGNOSIS — R4182 Altered mental status, unspecified: Secondary | ICD-10-CM | POA: Diagnosis not present

## 2022-03-28 DIAGNOSIS — Z800 Family history of malignant neoplasm of digestive organs: Secondary | ICD-10-CM | POA: Diagnosis not present

## 2022-03-28 DIAGNOSIS — Z87891 Personal history of nicotine dependence: Secondary | ICD-10-CM | POA: Diagnosis not present

## 2022-03-28 DIAGNOSIS — I4719 Other supraventricular tachycardia: Secondary | ICD-10-CM | POA: Diagnosis present

## 2022-03-28 LAB — RENAL FUNCTION PANEL
Albumin: 2.4 g/dL — ABNORMAL LOW (ref 3.5–5.0)
Anion gap: 7 (ref 5–15)
BUN: 16 mg/dL (ref 8–23)
CO2: 23 mmol/L (ref 22–32)
Calcium: 8.5 mg/dL — ABNORMAL LOW (ref 8.9–10.3)
Chloride: 109 mmol/L (ref 98–111)
Creatinine, Ser: 1.01 mg/dL (ref 0.61–1.24)
GFR, Estimated: >60 mL/min (ref >60)
Glucose, Bld: 92 mg/dL (ref 70–99)
Phosphorus: 3.4 mg/dL (ref 2.5–4.6)
Potassium: 3.9 mmol/L (ref 3.5–5.1)
Sodium: 139 mmol/L (ref 135–145)

## 2022-03-28 LAB — TROPONIN I (HIGH SENSITIVITY): Troponin I (High Sensitivity): 9 ng/L (ref <18)

## 2022-03-28 LAB — MAGNESIUM: Magnesium: 1.5 mg/dL — ABNORMAL LOW (ref 1.7–2.4)

## 2022-03-28 LAB — CBC
HCT: 38.4 % — ABNORMAL LOW (ref 39.0–52.0)
Hemoglobin: 11.6 g/dL — ABNORMAL LOW (ref 13.0–17.0)
MCH: 28 pg (ref 26.0–34.0)
MCHC: 30.2 g/dL (ref 30.0–36.0)
MCV: 92.5 fL (ref 80.0–100.0)
Platelets: 463 10*3/uL — ABNORMAL HIGH (ref 150–400)
RBC: 4.15 MIL/uL — ABNORMAL LOW (ref 4.22–5.81)
RDW: 17.5 % — ABNORMAL HIGH (ref 11.5–15.5)
WBC: 129.4 10*3/uL (ref 4.0–10.5)
nRBC: 0 % (ref 0.0–0.2)

## 2022-03-28 MED ORDER — LACTATED RINGERS IV BOLUS
1000.0000 mL | Freq: Once | INTRAVENOUS | Status: AC
  Administered 2022-03-28: 1000 mL via INTRAVENOUS

## 2022-03-28 MED ORDER — IOHEXOL 350 MG/ML SOLN
80.0000 mL | Freq: Once | INTRAVENOUS | Status: AC | PRN
  Administered 2022-03-28: 80 mL via INTRAVENOUS

## 2022-03-28 MED ORDER — METOPROLOL TARTRATE 25 MG PO TABS
25.0000 mg | ORAL_TABLET | Freq: Two times a day (BID) | ORAL | Status: DC
  Administered 2022-03-28 – 2022-03-29 (×3): 25 mg via ORAL
  Filled 2022-03-28 (×3): qty 1

## 2022-03-28 MED ORDER — APIXABAN 5 MG PO TABS
5.0000 mg | ORAL_TABLET | Freq: Two times a day (BID) | ORAL | Status: DC
  Administered 2022-03-28 – 2022-03-29 (×3): 5 mg via ORAL
  Filled 2022-03-28 (×3): qty 1

## 2022-03-28 MED ORDER — MIDODRINE HCL 5 MG PO TABS: 5.0000 mg | ORAL_TABLET | Freq: Three times a day (TID) | ORAL | Status: DC

## 2022-03-28 MED ORDER — SODIUM CHLORIDE 0.9 % IV SOLN: 1.0000 g | Freq: Two times a day (BID) | INTRAVENOUS | Status: DC

## 2022-03-28 MED ORDER — METOPROLOL TARTRATE 5 MG/5ML IV SOLN
2.5000 mg | Freq: Once | INTRAVENOUS | Status: AC
  Administered 2022-03-28: 2.5 mg via INTRAVENOUS
  Filled 2022-03-28: qty 5

## 2022-03-28 MED ORDER — MIDODRINE HCL 5 MG PO TABS
5.0000 mg | ORAL_TABLET | Freq: Three times a day (TID) | ORAL | Status: DC
  Administered 2022-03-28 – 2022-03-29 (×5): 5 mg via ORAL
  Filled 2022-03-28 (×5): qty 1

## 2022-03-28 MED ORDER — SODIUM CHLORIDE 0.9 % IV SOLN
2.0000 g | Freq: Two times a day (BID) | INTRAVENOUS | Status: DC
  Administered 2022-03-28 – 2022-03-29 (×3): 2 g via INTRAVENOUS
  Filled 2022-03-28 (×3): qty 12.5

## 2022-03-28 MED ORDER — SODIUM CHLORIDE 0.9 % IV SOLN: INTRAVENOUS | Status: AC

## 2022-03-28 MED ORDER — LEVOFLOXACIN 750 MG PO TABS: 750.0000 mg | ORAL_TABLET | Freq: Every day | ORAL | Status: DC

## 2022-03-28 MED ORDER — ACETAMINOPHEN 325 MG PO TABS: 650.0000 mg | ORAL_TABLET | Freq: Four times a day (QID) | ORAL | Status: DC | PRN

## 2022-03-28 MED ORDER — OXYBUTYNIN CHLORIDE 5 MG PO TABS
2.5000 mg | ORAL_TABLET | Freq: Three times a day (TID) | ORAL | Status: DC | PRN
  Administered 2022-03-28 – 2022-03-29 (×2): 2.5 mg via ORAL
  Filled 2022-03-28 (×2): qty 1

## 2022-03-28 MED ORDER — OXYCODONE HCL 5 MG PO TABS
10.0000 mg | ORAL_TABLET | ORAL | Status: DC | PRN
  Administered 2022-03-29: 10 mg via ORAL
  Filled 2022-03-28: qty 2

## 2022-03-28 MED ORDER — VALACYCLOVIR HCL 500 MG PO TABS
500.0000 mg | ORAL_TABLET | Freq: Every day | ORAL | Status: DC
  Administered 2022-03-28 – 2022-03-29 (×2): 500 mg via ORAL
  Filled 2022-03-28 (×2): qty 1

## 2022-03-28 MED ORDER — ONDANSETRON HCL 4 MG/2ML IJ SOLN: 4.0000 mg | Freq: Four times a day (QID) | INTRAMUSCULAR | Status: DC | PRN

## 2022-03-28 MED ORDER — ACETAMINOPHEN 650 MG RE SUPP: 650.0000 mg | Freq: Four times a day (QID) | RECTAL | Status: DC | PRN

## 2022-03-28 MED ORDER — OXYCODONE HCL ER 20 MG PO T12A
20.0000 mg | EXTENDED_RELEASE_TABLET | Freq: Two times a day (BID) | ORAL | Status: DC
  Administered 2022-03-28 – 2022-03-29 (×3): 20 mg via ORAL
  Filled 2022-03-28: qty 1
  Filled 2022-03-28: qty 2
  Filled 2022-03-28: qty 1

## 2022-03-28 MED ORDER — ALBUTEROL SULFATE (2.5 MG/3ML) 0.083% IN NEBU: 2.5000 mg | INHALATION_SOLUTION | RESPIRATORY_TRACT | Status: DC | PRN

## 2022-03-28 MED ORDER — ONDANSETRON HCL 4 MG PO TABS: 4.0000 mg | ORAL_TABLET | Freq: Four times a day (QID) | ORAL | Status: DC | PRN

## 2022-03-28 NOTE — H&P (Signed)
History and Physical    Alfred Brown [facility] ZCH:[id number] DOB: December 15, 1950 DOA: 03/27/2022  DOS: the patient was seen and examined on 03/27/2022  PCP: Redmond School, MD   Patient coming from: Home  I have personally briefly reviewed patient's old medical records in [place]  CC: weakness HPI: 71 year old male history of PSVT, stage IV squamous cell lung cancer, history of CLL, recent Pseudomonas bacteremia presents to the ER today with weakness.  Patient was unable to get outpatient IV fluids arranged through home health.  Appears that the family called oncology and was told to bring him to the ER.  On arrival to the ER, he was in a supraventricular tachycardia.  He required IV fluids and was ultimately given 2.5 mg of IV Lopressor.  This terminated his SVT.  Labs showed his white count is increased to approximately 130,000.  When he was in the hospital 2 weeks ago his white count was 44,000.  CTPA has shown increase in his tumor burden now measuring 7.3 x 4.4 x 3.9 cm.  Previously it was 3.2 x 3.8 x 2.0 cm.  There is also appears to be tumor invasion into the left main pulmonary artery.  EDP discussed the case with oncology who felt the patient could stay at [facility] for [facility].  Formal oncology consult was requested.  [facility] hospital[facility] contacted for admission.   ED Course: SVT on arrival to ER. Treated with IVF and IV lopressor. WBC >130K.   Review of Systems:  Review of Systems  Constitutional:  Positive for malaise/fatigue and weight loss.  HENT: Negative.    Eyes: Negative.   Respiratory:  Positive for shortness of breath.   Cardiovascular: Negative.   Gastrointestinal: Negative.   Genitourinary: Negative.   Musculoskeletal:  Positive for back pain.  Skin: Negative.   Neurological: Negative.   Endo/Heme/Allergies: Negative.   Psychiatric/Behavioral: Negative.    All other systems reviewed and are negative.   Past Medical History:  Diagnosis Date    Aortic valve disorder    Mild insufficiency   Chronic lymphocytic leukemia ([place])    01/2012: WBC-90.2, H&H-15.1/45.3, platelets-185 03/31/12: Verified with Dr. Tressie Stalker that no precautions or modification of medical regime are required prior to orthopaedic surgery.    CLL (chronic lymphocytic leukemia) (HCC)    CMV (cytomegalovirus) (HCC)    COPD (chronic obstructive pulmonary disease) (HCC)    Depression with anxiety    DJD (degenerative joint disease)    GERD (gastroesophageal reflux disease)    Hyperlipidemia    Hypogammaglobulinemia ([place]) 09/23/2019   Lipoma    left chest wall   Palpitations 2004   PVCs; borderline stress nuclear in 2004, negative in 2007; Echo 2007; AV-sclerotic, very mild AI   Pneumonia    Right bundle branch block    + left posterior fascicular block   Tobacco abuse    80 pack years    Past Surgical History:  Procedure Laterality Date   COLONOSCOPY  01/31/2012   Negative screening study   GANGLION CYST EXCISION  04/02/1995   left wrist   IR IMAGING GUIDED PORT INSERTION  01/14/2022   LIPOMA EXCISION Left 10/11/2021   chest wall   PORT-A-CATH REMOVAL Right 03/14/2022   Procedure: MINOR REMOVAL PORT-A-CATH;  Surgeon: Virl Cagey, MD;  Location: [facility];  Service: General;  Laterality: Right;   ROTATOR CUFF REPAIR  04/02/1995   right   SHOULDER ARTHROSCOPY WITH SUBACROMIAL DECOMPRESSION  04/09/2012   Procedure: SHOULDER ARTHROSCOPY WITH SUBACROMIAL DECOMPRESSION;  Surgeon: Ninetta Lights, MD;  Location: [place];  Service: Orthopedics;  Laterality: Left;  LEFT SHOULDER ARTHROSCOPY, SUBACROMIAL DECOMPRESSION, PARTIAL ACROMIOPLASTY WITH CORACOACROMIAL RELEASE, DISTAL CLAVICULECTOMY WITH ROTATOR CUFF REPAIR, DEBRIDEMENT OF LABRUM     reports that he quit smoking about 3 months ago. His smoking use included cigarettes. He started smoking about 58 years ago. He has a 45.00 pack-year smoking history. He has never used smokeless  tobacco. He reports that he does not drink alcohol and does not use drugs.  No Known Allergies  Family History  Problem Relation Age of Onset   Stroke Mother    Heart attack Father    Cancer Sister        colon   Colon cancer Sister    Cancer Brother        2 brothers died with lung cancer    Prior to Admission medications   Medication Sig Start Date End Date Taking? Authorizing Provider  acetaminophen (TYLENOL) 500 MG tablet Take 500-1,000 mg by mouth every 6 (six) hours as needed for moderate pain.    [provider]  albuterol (VENTOLIN HFA) 108 (90 Base) MCG/ACT inhaler 2 puffs every 4 hours as needed if you can't catch your breath Patient taking differently: Inhale 1-2 puffs into the lungs every 4 (four) hours as needed for wheezing or shortness of breath. 12/04/21   Tanda Rockers, MD  apixaban (ELIQUIS) 5 MG TABS tablet Take 1 tablet (5 mg total) by mouth 2 (two) times daily. 02/07/22 04/08/22  Manuella Ghazi, Pratik D, DO  benzonatate (TESSALON) 100 MG capsule Take 1 capsule (100 mg total) by mouth 3 (three) times daily as needed for cough. 01/24/22   Florencia Reasons, MD  buPROPion [facility] SR) 150 MG 12 hr tablet Take 150 mg by mouth daily.    [provider]  Calcium Carb-Cholecalciferol (CALCIUM 600 + D PO) Take 1 tablet by mouth daily.    [provider]  CARBOPLATIN IV Inject into the vein every 21 ( twenty-one) days. 01/16/22   [provider]  clotrimazole-betamethasone (LOTRISONE) cream Apply 1 application  topically daily as needed (rash). 06/19/15   [provider]  dextromethorphan (DELSYM) 30 MG/5ML liquid Take 5 mLs (30 mg total) by mouth at bedtime as needed for cough. 01/24/22   Florencia Reasons, MD  diltiazem (CARDIZEM) 30 MG tablet Take 30 mg by mouth as needed (heart rate).    [provider]  feeding supplement (ENSURE ENLIVE / ENSURE PLUS) LIQD Take 237 mLs by mouth 2 (two) times daily between meals. 01/30/22   Manuella Ghazi, Pratik D, DO   fish oil-omega-3 fatty acids 1000 MG capsule Take 1 g by mouth daily.    [provider]  fluticasone (FLONASE) 50 MCG/ACT nasal spray Place 2 sprays into the nose daily.    [provider]  levofloxacin (LEVAQUIN) 750 MG tablet Take 1 tablet (750 mg total) by mouth daily for 14 days. 03/15/22 03/29/22  Manuella Ghazi, Pratik D, DO  lidocaine (LIDODERM) 5 % Place 1 patch onto the skin daily. Remove and Discard patch within 12 hours. Patient taking differently: Place 1 patch onto the skin daily as needed (pain). Remove and Discard patch within 12 hours. 11/28/21   Derek Jack, MD  lidocaine-prilocaine (EMLA) cream Apply a small amount to port a cath site and cover with plastic wrap 1 hour prior to infusion appointments 01/15/22   Derek Jack, MD  loperamide (IMODIUM) 2 MG capsule Take 1 capsule (2 mg  total) by mouth every 6 (six) hours as needed for diarrhea or loose stools. 01/24/22   Florencia Reasons, MD  Loratadine 10 MG CAPS Take 10 mg by mouth daily.    [provider]  lovastatin (MEVACOR) 20 MG tablet Take 20 mg by mouth daily. 09/29/14   [provider]  megestrol (MEGACE) 400 MG/10ML suspension Take 10 mLs (400 mg total) by mouth 2 (two) times daily. 02/06/22   Derek Jack, MD  metoprolol tartrate (LOPRESSOR) 50 MG tablet Take 1 tablet (50 mg total) by mouth 2 (two) times daily. 03/15/22 05/14/22  Manuella Ghazi, Pratik D, DO  midodrine (PROAMATINE) 5 MG tablet Take 1 tablet (5 mg total) by mouth 3 (three) times daily with meals. 03/15/22 04/14/22  Heath Lark D, DO  Multiple Vitamin (MULTIVITAMIN) tablet Take 1 tablet by mouth daily.    [provider]  niacin (NIASPAN) 500 MG CR tablet Take 500 mg by mouth daily. 09/29/14   [provider]  nystatin (MYCOSTATIN) 100000 UNIT/ML suspension Take 15 mLs (1,500,000 Units total) by mouth 4 (four) times daily. Swish and swallow 03/20/22   Derek Jack, MD  oxybutynin 2.5 MG TABS Take 2.5 mg  by mouth every 8 (eight) hours as needed for bladder spasms. 03/15/22 04/14/22  Manuella Ghazi, Pratik D, DO  oxyCODONE (OXYCONTIN) 20 mg 12 hr tablet Take 1 tablet (20 mg total) by mouth every 12 (twelve) hours. 01/31/22   Derek Jack, MD  Oxycodone HCl 10 MG TABS Take 1 tablet (10 mg total) by mouth every 4 (four) hours as needed. 12/26/21   Derek Jack, MD  PACLITAXEL IV Inject into the vein every 21 ( twenty-one) days. 01/16/22   [provider]  PEMBROLIZUMAB IV Inject into the vein every 21 ( twenty-one) days. 01/16/22   [provider]  polyethylene glycol (MIRALAX / GLYCOLAX) 17 g packet Take 17 g by mouth daily as needed for moderate constipation.    [provider]  potassium chloride SA (KLOR-CON M) 20 MEQ tablet Take 2 tablets (40 mEq total) by mouth daily. 03/15/22 04/14/22  Manuella Ghazi, Pratik D, DO  prochlorperazine (COMPAZINE) 10 MG tablet Take 1 tablet (10 mg total) by mouth every 6 (six) hours as needed for nausea or vomiting. 01/15/22   Derek Jack, MD  saccharomyces boulardii (FLORASTOR) 250 MG capsule Take 1 capsule (250 mg total) by mouth 2 (two) times daily. 03/15/22 04/14/22  Manuella Ghazi, Pratik D, DO  sennosides-docusate sodium (SENOKOT-S) 8.6-50 MG tablet Take 2 tablets by mouth 2 (two) times daily.    [provider]  valACYclovir (VALTREX) 500 MG tablet Take 500 mg by mouth daily.    [provider]  zolpidem (AMBIEN) 10 MG tablet Take 10 mg by mouth at bedtime as needed for sleep. 12/24/21   [provider]    Physical Exam: Vitals:   03/28/22 0235 03/28/22 0330 03/28/22 0400 03/28/22 0430  BP: 107/79 99/71 92/62  99/71  Pulse: (!) 142 92 (!) 124 (!) 109  Resp: 20 (!) 21 (!) 23 19  Temp:      TempSrc:      SpO2: 100% 98% 98% 98%  Weight:      Height:        Physical Exam Vitals and nursing note reviewed.  Constitutional:      Appearance: He is ill-appearing.     Comments: Appears thin and cachetic  HENT:      Head: Normocephalic and atraumatic.     Nose: Nose normal.  Cardiovascular:  Rate and Rhythm: Regular rhythm. Tachycardia present.  Pulmonary:     Effort: Pulmonary effort is normal.     Breath sounds: Examination of the right-upper field reveals decreased breath sounds. Examination of the left-upper field reveals decreased breath sounds. Examination of the right-middle field reveals decreased breath sounds. Examination of the left-middle field reveals decreased breath sounds. Decreased breath sounds present. No wheezing or rhonchi.  Abdominal:     General: Bowel sounds are normal. There is no distension.     Palpations: Abdomen is soft.     Tenderness: There is no abdominal tenderness.  Musculoskeletal:     Right lower leg: No edema.     Left lower leg: No edema.  Skin:    General: Skin is warm and dry.     Capillary Refill: Capillary refill takes less than 2 seconds.  Neurological:     Mental Status: He is oriented to person, place, and time.      Labs on Admission: I have personally reviewed following labs and imaging studies  CBC: Recent Labs  Lab 03/27/22 2118  WBC 129.4*  HGB 11.6*  HCT 38.4*  MCV 92.5  PLT 175*   Basic Metabolic Panel: Recent Labs  Lab 03/27/22 2118  NA 139  K 5.2*  CL 108  CO2 21*  GLUCOSE 127*  BUN 21  CREATININE 1.14  CALCIUM 10.0   GFR: Estimated Creatinine Clearance: 49.5 mL/min (by C-G formula based on SCr of 1.14 mg/dL). Liver Function Tests: Recent Labs  Lab 03/27/22 2118  AST 18  ALT 14  ALKPHOS 86  BILITOT 0.6  PROT 6.6  ALBUMIN 3.3*   No results for input(s): "LIPASE", "AMYLASE" in the last 168 hours. No results for input(s): "AMMONIA" in the last 168 hours. Coagulation Profile: No results for input(s): "INR", "PROTIME" in the last 168 hours. Cardiac Enzymes: Recent Labs  Lab 03/27/22 2118 03/27/22 2306  TROPONINIHS 11 9   BNP (last 3 results) No results for input(s): "PROBNP" in the last 8760  hours. HbA1C: No results for input(s): "HGBA1C" in the last 72 hours. CBG: No results for input(s): "GLUCAP" in the last 168 hours. Lipid Profile: No results for input(s): "CHOL", "HDL", "LDLCALC", "TRIG", "CHOLHDL", "LDLDIRECT" in the last 72 hours. Thyroid Function Tests: No results for input(s): "TSH", "T4TOTAL", "FREET4", "T3FREE", "THYROIDAB" in the last 72 hours. Anemia Panel: No results for input(s): "VITAMINB12", "FOLATE", "FERRITIN", "TIBC", "IRON", "RETICCTPCT" in the last 72 hours. Urine analysis:    Component Value Date/Time   COLORURINE AMBER (A) 03/02/2022 1903   APPEARANCEUR CLEAR 03/02/2022 1903   APPEARANCEUR Clear 11/24/2019 1047   LABSPEC 1.029 03/02/2022 1903   PHURINE 5.0 03/02/2022 1903   GLUCOSEU NEGATIVE 03/02/2022 1903   HGBUR NEGATIVE 03/02/2022 1903   BILIRUBINUR NEGATIVE 03/02/2022 1903   BILIRUBINUR Negative 11/24/2019 1047   KETONESUR 5 (A) 03/02/2022 1903   PROTEINUR 100 (A) 03/02/2022 1903   NITRITE NEGATIVE 03/02/2022 1903   LEUKOCYTESUR NEGATIVE 03/02/2022 1903    Radiological Exams on Admission: I have personally reviewed images CT Angio Chest Pulmonary Embolism (PE) W or WO Contrast  Result Date: 03/28/2022 CLINICAL DATA:  Shortness of breath and weakness EXAM: CT ANGIOGRAPHY CHEST WITH CONTRAST TECHNIQUE: Multidetector CT imaging of the chest was performed using the standard protocol during bolus administration of intravenous contrast. Multiplanar CT image reconstructions and MIPs were obtained to evaluate the vascular anatomy. RADIATION DOSE REDUCTION: This exam was performed according to the departmental dose-optimization program which includes automated exposure control, adjustment  of the mA and/or kV according to patient size and/or use of iterative reconstruction technique. CONTRAST:  14mL OMNIPAQUE IOHEXOL 350 MG/ML SOLN COMPARISON:  CT chest 01/27/2022 FINDINGS: Cardiovascular: Satisfactory opacification of the central pulmonary arteries.  There is occlusion or near occlusion of the left main pulmonary artery secondary to the left hilar/mediastinal mass. There is complete occlusion of the left upper lobe pulmonary artery. Probable subsegmental pulmonary emboli in the left lower lobe pulmonary arteries (circa series 12/image 59). It is unclear if this represents bland or tumor thrombus. Normal heart size. Small pericardial effusion. Coronary artery and aortic atherosclerotic calcification. Mediastinum/Nodes: Increased size of the heterogenously enhancing mass centered within the left hilum/mediastinum now measuring approximately 7.3 x 4.4 x 3.9 cm, previously 3.2 x 3.8 x 2.0 cm. Slightly increased subcarinal adenopathy. Similar to slightly increased right hilar adenopathy. Lungs/Pleura: Increased size of the left upper lung mass now measuring 3.4 cm in greatest dimension, previously 3.1 cm. The gaseous component of central cavitation has nearly resolved. The mass extends towards the hilum along the peribronchovascular bundle and occludes multiple left upper lobe bronchi and severely narrows multiple lingular bronchi. Interlobular septal thickening along the left hilum is likely due to lymphangitic spread of tumor. The irregular consolidative mass in the posterolateral left upper lobe has decreased in size compatible with an improving infectious/inflammatory etiology or treatment response. Biapical pleural-parenchymal scarring. Upper Abdomen: No acute abnormality. Musculoskeletal: Lytic lesion in the T4 vertebral body extending into the right posterior elements compatible with metastasis. No definite extension into the spinal canal however this is poorly evaluated with CT. Destructive lytic lesion in the posterior right second and anterolateral right fourth rib compatible with metastasis and similar to prior. Review of the MIP images confirms the above findings. IMPRESSION: Findings compatible with disease progression with increased size of the left  upper lobe mass and enlarging left hilar/mediastinal bulky adenopathy. There is severe narrowing and possible occlusion of the left main pulmonary artery secondary to compression by the mass with suggestion of invasion. Nonocclusive emboli extend into the segmental branches of the left lower lobe. Lytic lesion in the T4 vertebral body extending into the posterior elements has increased since 01/27/2022. Additional rib metastases are similar. Electronically Signed   By: Placido Sou M.D.   On: 03/28/2022 03:29   CT HEAD WO CONTRAST (5MM)  Result Date: 03/28/2022 CLINICAL DATA:  Altered mental status. EXAM: CT HEAD WITHOUT CONTRAST TECHNIQUE: Contiguous axial images were obtained from the base of the skull through the vertex without intravenous contrast. RADIATION DOSE REDUCTION: This exam was performed according to the departmental dose-optimization program which includes automated exposure control, adjustment of the mA and/or kV according to patient size and/or use of iterative reconstruction technique. COMPARISON:  Head CT dated 03/02/2022. FINDINGS: Brain: The ventricles and sulci are appropriate size for the patient's age. The gray-white matter discrimination is preserved. There is no acute intracranial hemorrhage. No mass effect or midline shift. No extra-axial fluid collection. Vascular: No hyperdense vessel or unexpected calcification. Skull: Normal. Negative for fracture or focal lesion. Sinuses/Orbits: No acute finding. Other: None IMPRESSION: No acute intracranial pathology. Electronically Signed   By: Anner Crete M.D.   On: 03/28/2022 00:25   DG Chest 2 View  Result Date: 03/27/2022 CLINICAL DATA:  Shortness of breath. EXAM: CHEST - 2 VIEW COMPARISON:  Chest radiograph dated 03/14/2022. FINDINGS: Faint area of airspace opacity primarily involving the left upper lobe most consistent with pneumonia. Clinical correlation and follow-up recommended. No pleural effusion or  pneumothorax the the  cardiac silhouette is within limits. Atherosclerotic calcification of the aorta. No acute osseous pathology. IMPRESSION: Left upper lobe pneumonia.  Follow-up to resolution recommended. Electronically Signed   By: Anner Crete M.D.   On: 03/27/2022 20:52    EKG: My personal interpretation of EKG shows: SVT    Assessment/Plan Principal Problem:   SVT (supraventricular tachycardia) Active Problems:   Squamous cell lung cancer, left (HCC)   Bacteremia due to Pseudomonas   GERD (gastroesophageal reflux disease)   Chronic lymphocytic leukemia (HCC)   Metastasis to bone (HCC)    Assessment and Plan: * SVT (supraventricular tachycardia) Observation telemetry bed. Continue with home meds with lopressor. Continue with IVF.  Bacteremia due to Pseudomonas Today is his last day of levaquin therapy.  Squamous cell lung cancer, left (HCC) Metastatic to bone. Was diagnosed in September 2023. Has not tolerated chemo or immunotherapy. Heme/Onc to discuss with pt and his wife about his prognosis. Pt and wife have decided for DNR/DNI status but have not decided whether they want to pursue hospice care. Will need heme/onc input so patient can better plan for his terminal illness.  Metastasis to bone (HCC) Continue with pain meds.  Chronic lymphocytic leukemia ([place]) Has 20 year hx of CLL. Was recently treated with Granix. Heme/onc consult to evaluate pt and determine if pt's WBC of >130K is acute conversion vs Granix stimulation of bone marrow.  GERD (gastroesophageal reflux disease) Stable.   DVT prophylaxis: Eliquis Code Status: DNR/DNI(Do NOT Intubate). Discussed with pt and wife Alfred Brown at bedside Family Communication: discussed with pt and wife Alfred Brown at bedside  Disposition Plan: return home  Consults called: EDP has requested heme/onc consult.  Admission status: Observation, Telemetry bed   Kristopher Oppenheim, DO [facility] 03/28/2022, 4:59 AM

## 2022-03-28 NOTE — ED Notes (Signed)
Dr. Betsey Holiday made aware of pts BP and HR.

## 2022-03-28 NOTE — ED Provider Notes (Signed)
[facility] EMERGENCY DEPARTMENT Provider Note   CSN: [id number] Arrival date & time: 03/27/22  1740     History  Chief Complaint  Patient presents with   Shortness of Breath   Weakness    Alfred Brown is a 71 y.o. male.  Patient presents to the emergency department for evaluation of generalized weakness.  He is currently receiving treatment for stage IV lung cancer.  He has a history of CLL as well.  Patient was not feeling well last week and his primary care doctor tried to arrange for outpatient IV fluids via home health but was unsuccessful.  Patient continues to have poor oral intake, not eating or drinking and is progressively more weak.       Home Medications Prior to Admission medications   Medication Sig Start Date End Date Taking? Authorizing Provider  acetaminophen (TYLENOL) 500 MG tablet Take 500-1,000 mg by mouth every 6 (six) hours as needed for moderate pain.    [provider]  albuterol (VENTOLIN HFA) 108 (90 Base) MCG/ACT inhaler 2 puffs every 4 hours as needed if you can't catch your breath Patient taking differently: Inhale 1-2 puffs into the lungs every 4 (four) hours as needed for wheezing or shortness of breath. 12/04/21   Tanda Rockers, MD  apixaban (ELIQUIS) 5 MG TABS tablet Take 1 tablet (5 mg total) by mouth 2 (two) times daily. 02/07/22 04/08/22  Manuella Ghazi, Pratik D, DO  benzonatate (TESSALON) 100 MG capsule Take 1 capsule (100 mg total) by mouth 3 (three) times daily as needed for cough. 01/24/22   Florencia Reasons, MD  buPROPion [facility] SR) 150 MG 12 hr tablet Take 150 mg by mouth daily.    [provider]  Calcium Carb-Cholecalciferol (CALCIUM 600 + D PO) Take 1 tablet by mouth daily.    [provider]  CARBOPLATIN IV Inject into the vein every 21 ( twenty-one) days. 01/16/22   [provider]  clotrimazole-betamethasone (LOTRISONE) cream Apply 1 application  topically daily as needed (rash). 06/19/15   [provider]  dextromethorphan (DELSYM) 30 MG/5ML liquid Take 5 mLs (30 mg total) by mouth at bedtime as needed for cough. 01/24/22   Florencia Reasons, MD  diltiazem (CARDIZEM) 30 MG tablet Take 30 mg by mouth as needed (heart rate).    [provider]  feeding supplement (ENSURE ENLIVE / ENSURE PLUS) LIQD Take 237 mLs by mouth 2 (two) times daily between meals. 01/30/22   Manuella Ghazi, Pratik D, DO  fish oil-omega-3 fatty acids 1000 MG capsule Take 1 g by mouth daily.    [provider]  fluticasone (FLONASE) 50 MCG/ACT nasal spray Place 2 sprays into the nose daily.    [provider]  levofloxacin (LEVAQUIN) 750 MG tablet Take 1 tablet (750 mg total) by mouth daily for 14 days. 03/15/22 03/29/22  Manuella Ghazi, Pratik D, DO  lidocaine (LIDODERM) 5 % Place 1 patch onto the skin daily. Remove and Discard patch within 12 hours. Patient taking differently: Place 1 patch onto the skin daily as needed (pain). Remove and Discard patch within 12 hours. 11/28/21   Derek Jack, MD  lidocaine-prilocaine (EMLA) cream Apply a small amount to port a cath site and cover with plastic wrap 1 hour prior to infusion appointments 01/15/22   Derek Jack, MD  loperamide (IMODIUM) 2 MG capsule Take 1 capsule (2 mg total) by mouth every 6 (six) hours as needed for diarrhea or loose stools. 01/24/22   Florencia Reasons, MD  Loratadine 10 MG CAPS Take 10 mg by mouth daily.    [provider]  lovastatin (MEVACOR) 20 MG tablet Take 20 mg by mouth daily. 09/29/14   [provider]  megestrol (MEGACE) 400 MG/10ML suspension Take 10 mLs (400 mg total) by mouth 2 (two) times daily. 02/06/22   Derek Jack, MD  metoprolol tartrate (LOPRESSOR) 50 MG tablet Take 1 tablet (50 mg total) by mouth 2 (two) times daily. 03/15/22 05/14/22  Manuella Ghazi, Pratik D, DO  midodrine (PROAMATINE) 5 MG tablet Take 1 tablet (5 mg total) by mouth 3 (three) times daily with meals. 03/15/22 04/14/22  Heath Lark D, DO  Multiple Vitamin  (MULTIVITAMIN) tablet Take 1 tablet by mouth daily.    [provider]  niacin (NIASPAN) 500 MG CR tablet Take 500 mg by mouth daily. 09/29/14   [provider]  nystatin (MYCOSTATIN) 100000 UNIT/ML suspension Take 15 mLs (1,500,000 Units total) by mouth 4 (four) times daily. Swish and swallow 03/20/22   Derek Jack, MD  oxybutynin 2.5 MG TABS Take 2.5 mg by mouth every 8 (eight) hours as needed for bladder spasms. 03/15/22 04/14/22  Manuella Ghazi, Pratik D, DO  oxyCODONE (OXYCONTIN) 20 mg 12 hr tablet Take 1 tablet (20 mg total) by mouth every 12 (twelve) hours. 01/31/22   Derek Jack, MD  Oxycodone HCl 10 MG TABS Take 1 tablet (10 mg total) by mouth every 4 (four) hours as needed. 12/26/21   Derek Jack, MD  PACLITAXEL IV Inject into the vein every 21 ( twenty-one) days. 01/16/22   [provider]  PEMBROLIZUMAB IV Inject into the vein every 21 ( twenty-one) days. 01/16/22   [provider]  polyethylene glycol (MIRALAX / GLYCOLAX) 17 g packet Take 17 g by mouth daily as needed for moderate constipation.    [provider]  potassium chloride SA (KLOR-CON M) 20 MEQ tablet Take 2 tablets (40 mEq total) by mouth daily. 03/15/22 04/14/22  Manuella Ghazi, Pratik D, DO  prochlorperazine (COMPAZINE) 10 MG tablet Take 1 tablet (10 mg total) by mouth every 6 (six) hours as needed for nausea or vomiting. 01/15/22   Derek Jack, MD  saccharomyces boulardii (FLORASTOR) 250 MG capsule Take 1 capsule (250 mg total) by mouth 2 (two) times daily. 03/15/22 04/14/22  Manuella Ghazi, Pratik D, DO  sennosides-docusate sodium (SENOKOT-S) 8.6-50 MG tablet Take 2 tablets by mouth 2 (two) times daily.    [provider]  valACYclovir (VALTREX) 500 MG tablet Take 500 mg by mouth daily.    [provider]  zolpidem (AMBIEN) 10 MG tablet Take 10 mg by mouth at bedtime as needed for sleep. 12/24/21   [provider]      Allergies    Patient has no  known allergies.    Review of Systems   Review of Systems  Physical Exam Updated Vital Signs BP 99/71   Pulse 92   Temp (!) 97.5 F (36.4 C)   Resp (!) 21   Ht 6\' 1"  (1.854 m)   Wt 58.1 kg   SpO2 98%   BMI 16.89 kg/m  Physical Exam Vitals and nursing note reviewed.  Constitutional:      General: He is not in acute distress.    Appearance: He is well-developed. He is ill-appearing.  HENT:     Head: Normocephalic and atraumatic.     Mouth/Throat:     Mouth: Mucous membranes are moist.  Eyes:     General: Vision grossly intact. Gaze aligned appropriately.  Extraocular Movements: Extraocular movements intact.     Conjunctiva/sclera: Conjunctivae normal.  Cardiovascular:     Rate and Rhythm: Normal rate and regular rhythm.     Pulses: Normal pulses.     Heart sounds: Normal heart sounds, S1 normal and S2 normal. No murmur heard.    No friction rub. No gallop.  Pulmonary:     Effort: Pulmonary effort is normal. No respiratory distress.     Breath sounds: Normal breath sounds.  Abdominal:     Palpations: Abdomen is soft.     Tenderness: There is no abdominal tenderness. There is no guarding or rebound.     Hernia: No hernia is present.  Musculoskeletal:        General: No swelling.     Cervical back: Full passive range of motion without pain, normal range of motion and neck supple. No pain with movement, spinous process tenderness or muscular tenderness. Normal range of motion.     Right lower leg: No edema.     Left lower leg: No edema.  Skin:    General: Skin is warm and dry.     Capillary Refill: Capillary refill takes less than 2 seconds.     Findings: No ecchymosis, erythema, lesion or wound.  Neurological:     Mental Status: He is alert and oriented to person, place, and time.     GCS: GCS eye subscore is 4. GCS verbal subscore is 5. GCS motor subscore is 6.     Cranial Nerves: Cranial nerves 2-12 are intact.     Sensory: Sensation is intact.     Motor: Motor  function is intact. No weakness or abnormal muscle tone.     Coordination: Coordination is intact.  Psychiatric:        Mood and Affect: Mood normal.        Speech: Speech normal.        Behavior: Behavior normal.     ED Results / Procedures / Treatments   Labs (all labs ordered are listed, but only abnormal results are displayed) Labs Reviewed  CBC - Abnormal; Notable for the following components:      Result Value   WBC 129.4 (*)    RBC 4.15 (*)    Hemoglobin 11.6 (*)    HCT 38.4 (*)    RDW 17.5 (*)    Platelets 463 (*)    All other components within normal limits  BASIC METABOLIC PANEL - Abnormal; Notable for the following components:   Potassium 5.2 (*)    CO2 21 (*)    Glucose, Bld 127 (*)    All other components within normal limits  HEPATIC FUNCTION PANEL - Abnormal; Notable for the following components:   Albumin 3.3 (*)    All other components within normal limits  BLOOD GAS, VENOUS - Abnormal; Notable for the following components:   pCO2, Ven 37 (*)    pO2, Ven <31 (*)    All other components within normal limits  TROPONIN I (HIGH SENSITIVITY)  TROPONIN I (HIGH SENSITIVITY)    EKG None  Radiology CT Angio Chest Pulmonary Embolism (PE) W or WO Contrast  Result Date: 03/28/2022 CLINICAL DATA:  Shortness of breath and weakness EXAM: CT ANGIOGRAPHY CHEST WITH CONTRAST TECHNIQUE: Multidetector CT imaging of the chest was performed using the standard protocol during bolus administration of intravenous contrast. Multiplanar CT image reconstructions and MIPs were obtained to evaluate the vascular anatomy. RADIATION DOSE REDUCTION: This exam was performed according to the departmental  dose-optimization program which includes automated exposure control, adjustment of the mA and/or kV according to patient size and/or use of iterative reconstruction technique. CONTRAST:  77mL OMNIPAQUE IOHEXOL 350 MG/ML SOLN COMPARISON:  CT chest 01/27/2022 FINDINGS: Cardiovascular:  Satisfactory opacification of the central pulmonary arteries. There is occlusion or near occlusion of the left main pulmonary artery secondary to the left hilar/mediastinal mass. There is complete occlusion of the left upper lobe pulmonary artery. Probable subsegmental pulmonary emboli in the left lower lobe pulmonary arteries (circa series 12/image 59). It is unclear if this represents bland or tumor thrombus. Normal heart size. Small pericardial effusion. Coronary artery and aortic atherosclerotic calcification. Mediastinum/Nodes: Increased size of the heterogenously enhancing mass centered within the left hilum/mediastinum now measuring approximately 7.3 x 4.4 x 3.9 cm, previously 3.2 x 3.8 x 2.0 cm. Slightly increased subcarinal adenopathy. Similar to slightly increased right hilar adenopathy. Lungs/Pleura: Increased size of the left upper lung mass now measuring 3.4 cm in greatest dimension, previously 3.1 cm. The gaseous component of central cavitation has nearly resolved. The mass extends towards the hilum along the peribronchovascular bundle and occludes multiple left upper lobe bronchi and severely narrows multiple lingular bronchi. Interlobular septal thickening along the left hilum is likely due to lymphangitic spread of tumor. The irregular consolidative mass in the posterolateral left upper lobe has decreased in size compatible with an improving infectious/inflammatory etiology or treatment response. Biapical pleural-parenchymal scarring. Upper Abdomen: No acute abnormality. Musculoskeletal: Lytic lesion in the T4 vertebral body extending into the right posterior elements compatible with metastasis. No definite extension into the spinal canal however this is poorly evaluated with CT. Destructive lytic lesion in the posterior right second and anterolateral right fourth rib compatible with metastasis and similar to prior. Review of the MIP images confirms the above findings. IMPRESSION: Findings  compatible with disease progression with increased size of the left upper lobe mass and enlarging left hilar/mediastinal bulky adenopathy. There is severe narrowing and possible occlusion of the left main pulmonary artery secondary to compression by the mass with suggestion of invasion. Nonocclusive emboli extend into the segmental branches of the left lower lobe. Lytic lesion in the T4 vertebral body extending into the posterior elements has increased since 01/27/2022. Additional rib metastases are similar. Electronically Signed   By: Placido Sou M.D.   On: 03/28/2022 03:29   CT HEAD WO CONTRAST (5MM)  Result Date: 03/28/2022 CLINICAL DATA:  Altered mental status. EXAM: CT HEAD WITHOUT CONTRAST TECHNIQUE: Contiguous axial images were obtained from the base of the skull through the vertex without intravenous contrast. RADIATION DOSE REDUCTION: This exam was performed according to the departmental dose-optimization program which includes automated exposure control, adjustment of the mA and/or kV according to patient size and/or use of iterative reconstruction technique. COMPARISON:  Head CT dated 03/02/2022. FINDINGS: Brain: The ventricles and sulci are appropriate size for the patient's age. The gray-white matter discrimination is preserved. There is no acute intracranial hemorrhage. No mass effect or midline shift. No extra-axial fluid collection. Vascular: No hyperdense vessel or unexpected calcification. Skull: Normal. Negative for fracture or focal lesion. Sinuses/Orbits: No acute finding. Other: None IMPRESSION: No acute intracranial pathology. Electronically Signed   By: Anner Crete M.D.   On: 03/28/2022 00:25   DG Chest 2 View  Result Date: 03/27/2022 CLINICAL DATA:  Shortness of breath. EXAM: CHEST - 2 VIEW COMPARISON:  Chest radiograph dated 03/14/2022. FINDINGS: Faint area of airspace opacity primarily involving the left upper lobe most consistent with pneumonia. Clinical  correlation and  follow-up recommended. No pleural effusion or pneumothorax the the cardiac silhouette is within limits. Atherosclerotic calcification of the aorta. No acute osseous pathology. IMPRESSION: Left upper lobe pneumonia.  Follow-up to resolution recommended. Electronically Signed   By: Anner Crete M.D.   On: 03/27/2022 20:52    Procedures Procedures    Medications Ordered in ED Medications  albuterol (VENTOLIN HFA) 108 (90 Base) MCG/ACT inhaler 2 puff (has no administration in time range)  midodrine (PROAMATINE) tablet 5 mg (5 mg Oral Given 03/28/22 0336)  lactated ringers bolus 1,000 mL (0 mLs Intravenous Stopped 03/28/22 0146)  lactated ringers bolus 1,000 mL (0 mLs Intravenous Stopped 03/28/22 0341)  iohexol (OMNIPAQUE) 350 MG/ML injection 80 mL (80 mLs Intravenous Contrast Given 03/28/22 0234)  metoprolol tartrate (LOPRESSOR) injection 2.5 mg (2.5 mg Intravenous Given 03/28/22 2725)    ED Course/ Medical Decision Making/ A&P                           Medical Decision Making Amount and/or Complexity of Data Reviewed Independent Historian: spouse External Data Reviewed: labs, radiology, ECG and notes. Labs: ordered. Decision-making details documented in ED Course. Radiology: ordered and independent interpretation performed. Decision-making details documented in ED Course. ECG/medicine tests: ordered and independent interpretation performed. Decision-making details documented in ED Course.  Risk Prescription drug management.   Presents to the emergency department with concerns over dehydration.  Patient currently receiving treatment for stage IV lung cancer.  Records from recent hospitalization and cardiology were reviewed.  He was hospitalized for neutropenic fever, currently still receiving Levaquin for Pseudomonas bacteremia.  He is afebrile.  Patient's white blood cell count is 129.  Reviewing records reveals that he had been receiving Granix daily because of his neutropenic fever.   White blood cell count was 130 when he was seen at oncology office 1 week ago.  I did discuss these findings with Dr. Delton Coombes, his oncologist.  He reports that this is a combination of his CLL and the Granix and does not need specific treatment.  Specifically he does not need to be transferred to a tertiary care center for acute leukemia treatment.  Patient has become tachycardic.  Rhythm is either junctional tachycardia, a flutter with 2-1 block or SVT.  Wife reports that this happens when he does not get his beta-blocker.  When I reviewed his records from hospitalization for the neutropenic fever, he had difficulty arrhythmias during that hospital stay.  He had atrial fibrillation, atrial flutter, intermittent episodes of SVT.  He was treated with combination of beta-blocker and midodrine because of low blood pressure secondary to beta-blockade.  Patient has not had his meds while he was in the emergency department waiting room.  He was therefore given a dose of midodrine and a small dose of IV Lopressor.  Heart rate is improved but he is still tachycardic.  Most recent telemetry indicates sinus tachycardia at 110.  Because of his arrhythmia, CT angiography was performed.  He does have what appears to be infiltrative tumor and compression of the left pulmonary artery.  He is on Eliquis and this is most likely tumor not clot.  I did discuss this directly with the radiologist.  Reviewing oncology records, palliative care consult was placed.  Dr. Delton Coombes indicates that if he does not respond to treatment, hospice would be indicated.  I suspect that we are at that point.  CRITICAL CARE Performed by: Orpah Greek   Total  critical care time: 35 minutes  Critical care time was exclusive of separately billable procedures and treating other patients.  Critical care was necessary to treat or prevent imminent or life-threatening deterioration.  Critical care was time spent personally by  me on the following activities: development of treatment plan with patient and/or surrogate as well as nursing, discussions with consultants, evaluation of patient's response to treatment, examination of patient, obtaining history from patient or surrogate, ordering and performing treatments and interventions, ordering and review of laboratory studies, ordering and review of radiographic studies, pulse oximetry and re-evaluation of patient's condition.         Final Clinical Impression(s) / ED Diagnoses Final diagnoses:  Generalized weakness    Rx / DC Orders ED Discharge Orders     None         Sharlynn Seckinger, Gwenyth Allegra, MD 03/28/22 [phone]

## 2022-03-28 NOTE — Assessment & Plan Note (Signed)
Observation telemetry bed. Continue with home meds with lopressor. Continue with IVF.

## 2022-03-28 NOTE — Assessment & Plan Note (Signed)
Continue with pain meds.

## 2022-03-28 NOTE — ED Notes (Signed)
ED Provider at bedside. 

## 2022-03-28 NOTE — Subjective & Objective (Signed)
CC: weakness HPI: 71 year old male history of PSVT, stage IV squamous cell lung cancer, history of CLL, recent Pseudomonas bacteremia presents to the ER today with weakness.  Patient was unable to get outpatient IV fluids arranged through home health.  Appears that the family called oncology and was told to bring him to the ER.  On arrival to the ER, he was in a supraventricular tachycardia.  He required IV fluids and was ultimately given 2.5 mg of IV Lopressor.  This terminated his SVT.  Labs showed his white count is increased to approximately 130,000.  When he was in the hospital 2 weeks ago his white count was 44,000.  CTPA has shown increase in his tumor burden now measuring 7.3 x 4.4 x 3.9 cm.  Previously it was 3.2 x 3.8 x 2.0 cm.  There is also appears to be tumor invasion into the left main pulmonary artery.  EDP discussed the case with oncology who felt the patient could stay at [facility] for treatment.  Formal oncology consult was requested.  [facility] hospitalist contacted for admission.

## 2022-03-28 NOTE — ED Notes (Signed)
Recliner brought into room for family member.

## 2022-03-28 NOTE — ED Notes (Signed)
Pt ate 3 bites of pizza for dinner

## 2022-03-28 NOTE — Assessment & Plan Note (Signed)
Stable

## 2022-03-28 NOTE — Progress Notes (Signed)
Patient seen and evaluated, chart reviewed, please see EMR for updated orders. Please see full H&P dictated by admitting physician Dr Bridgett Larsson for same date of service.   Brief Summary:- 71 year old male history of PSVT, stage IV squamous cell lung cancer, history of CLL, recent Pseudomonas bacteremia admitted on 03/28/2022 with paroxysmal ventricular supraventricular tachycardia with persistent tachycardia and hypotension  A/p 1)PSVT--- metoprolol helps patient's tachycardia however blood pressure has been soft so continue midodrine for pressure support -Without metoprolol patient becomes tachycardic  2) Pseudomonas bacteremia----PTA was on Levaquin -Prolonged QT noted so we will switch to cefepime to complete treatment last dose will be 03/29/2022  3) metastatic lung cancer--- progressive, imaging study shows progression of underlying metastatic cancer with invasion into the left main pulmonary artery -Overall prognosis is poor -Patient and wife verbalized understanding of poor prognosis -To follow-up with oncologist Dr. Delton Coombes and follow-up with palliative care team postdischarge -Apparently did not tolerate chemo -As per patient's wife Beryle Flock is currently on hold  4)H/o CLL--- also recently treated with Granix significant leukocytosis noted  Plan of care discussed with patient and wife at bedside -Questions answered -Anticipate possible discharge on 03/29/2022 if hemodynamically stable-- -tendency towards hypotension with metoprolol use and tendency towards tachycardia when metoprolol was discontinued  -Total care time 57 minutes  Roxan Hockey, MD

## 2022-03-28 NOTE — Assessment & Plan Note (Signed)
Metastatic to bone. Was diagnosed in September 2023. Has not tolerated chemo or immunotherapy. Heme/Onc to discuss with pt and his wife about his prognosis. Pt and wife have decided for DNR/DNI status but have not decided whether they want to pursue hospice care. Will need heme/onc input so patient can better plan for his terminal illness.

## 2022-03-28 NOTE — Plan of Care (Signed)

## 2022-03-28 NOTE — Progress Notes (Signed)
  Transition of Care [facility]) Screening Note   Patient Details  Name: Agency Date of Birth: November 16, 1950   Transition of Care [facility]) CM/SW Contact:    Iona Beard, [place] Phone Number: 03/28/2022, 12:50 PM    Transition of Care Department [facility]) has reviewed patient and no TOC needs have been identified at this time. We will continue to monitor patient advancement through interdisciplinary progression rounds. If new patient transition needs arise, please place a TOC consult.

## 2022-03-28 NOTE — Assessment & Plan Note (Signed)
Today is his last day of levaquin therapy.

## 2022-03-28 NOTE — Assessment & Plan Note (Signed)
Has 20 year hx of CLL. Was recently treated with Granix. Heme/onc consult to evaluate pt and determine if pt's WBC of >130K is acute conversion vs Granix stimulation of bone marrow.

## 2022-03-29 ENCOUNTER — Other Ambulatory Visit: Payer: Self-pay | Admitting: Hematology

## 2022-03-29 ENCOUNTER — Ambulatory Visit: Payer: Medicare Other | Admitting: Internal Medicine

## 2022-03-29 ENCOUNTER — Other Ambulatory Visit: Payer: Self-pay

## 2022-03-29 DIAGNOSIS — C7951 Secondary malignant neoplasm of bone: Secondary | ICD-10-CM | POA: Diagnosis not present

## 2022-03-29 DIAGNOSIS — C9110 Chronic lymphocytic leukemia of B-cell type not having achieved remission: Secondary | ICD-10-CM | POA: Diagnosis not present

## 2022-03-29 DIAGNOSIS — C3492 Malignant neoplasm of unspecified part of left bronchus or lung: Secondary | ICD-10-CM | POA: Diagnosis not present

## 2022-03-29 DIAGNOSIS — I471 Supraventricular tachycardia, unspecified: Secondary | ICD-10-CM | POA: Diagnosis not present

## 2022-03-29 LAB — BASIC METABOLIC PANEL
Anion gap: 9 (ref 5–15)
BUN: 12 mg/dL (ref 8–23)
CO2: 21 mmol/L — ABNORMAL LOW (ref 22–32)
Calcium: 8.5 mg/dL — ABNORMAL LOW (ref 8.9–10.3)
Chloride: 107 mmol/L (ref 98–111)
Creatinine, Ser: 0.82 mg/dL (ref 0.61–1.24)
GFR, Estimated: >60 mL/min (ref >60)
Glucose, Bld: 90 mg/dL (ref 70–99)
Potassium: 3.2 mmol/L — ABNORMAL LOW (ref 3.5–5.1)
Sodium: 137 mmol/L (ref 135–145)

## 2022-03-29 LAB — MAGNESIUM: Magnesium: 1.5 mg/dL — ABNORMAL LOW (ref 1.7–2.4)

## 2022-03-29 MED ORDER — MIDODRINE HCL 10 MG PO TABS: 10.0000 mg | ORAL_TABLET | Freq: Three times a day (TID) | ORAL | 0 refills | Status: AC

## 2022-03-29 MED ORDER — POTASSIUM CHLORIDE CRYS ER 20 MEQ PO TBCR: 40.0000 meq | EXTENDED_RELEASE_TABLET | Freq: Every day | ORAL | 0 refills | Status: AC

## 2022-03-29 MED ORDER — MIDODRINE HCL 5 MG PO TABS
10.0000 mg | ORAL_TABLET | Freq: Three times a day (TID) | ORAL | Status: DC
  Administered 2022-03-29 (×2): 10 mg via ORAL
  Filled 2022-03-29 (×2): qty 2

## 2022-03-29 MED ORDER — MAGNESIUM OXIDE 400 MG PO CAPS: 1.0000 | ORAL_CAPSULE | Freq: Every day | ORAL | 0 refills | Status: AC

## 2022-03-29 MED ORDER — MAGNESIUM SULFATE 4 GM/100ML IV SOLN
4.0000 g | Freq: Once | INTRAVENOUS | Status: AC
  Administered 2022-03-29: 4 g via INTRAVENOUS
  Filled 2022-03-29: qty 100

## 2022-03-29 MED ORDER — METOPROLOL TARTRATE 5 MG/5ML IV SOLN
5.0000 mg | Freq: Once | INTRAVENOUS | Status: DC
  Filled 2022-03-29: qty 5

## 2022-03-29 MED ORDER — MEGESTROL ACETATE 400 MG/10ML PO SUSP: 400.0000 mg | Freq: Two times a day (BID) | ORAL | 3 refills | Status: AC

## 2022-03-29 MED ORDER — OXYCODONE HCL 10 MG PO TABS: 10.0000 mg | ORAL_TABLET | ORAL | 0 refills | Status: AC | PRN

## 2022-03-29 MED ORDER — POTASSIUM CHLORIDE CRYS ER 20 MEQ PO TBCR
40.0000 meq | EXTENDED_RELEASE_TABLET | ORAL | Status: AC
  Administered 2022-03-29 (×2): 40 meq via ORAL
  Filled 2022-03-29 (×2): qty 2

## 2022-03-29 NOTE — TOC Progression Note (Signed)
Transition of Care [facility]) - Progression Note    Patient Details  Name: ELIHU MILSTEIN MRN: [id number] Date of Birth: 1950/04/22  Transition of Care [facility]) CM/SW [place], [place] Phone Number: 03/29/2022, 1:12 PM  Clinical Narrative:    CSW updated by Attending MD that pts wife would like to speak with hospice. CSW reached out to [facility] with [place] and she will reach out to pts wife. TOC to follow.     Barriers to Discharge: Continued Medical Work up  Expected Discharge Plan and Services                                               Social Determinants of Health (SDOH) Interventions [place]: No Food Insecurity (03/02/2022)  Housing: Low Risk  (03/02/2022)  Transportation Needs: No Transportation Needs (03/02/2022)  Recent Concern: Transportation Needs - Unmet Transportation Needs (01/28/2022)  Utilities: Not At Risk (03/02/2022)  Depression (PHQ2-9): Low Risk  (02/27/2022)  Tobacco Use: Medium Risk (03/28/2022)    Readmission Risk Interventions    01/28/2022   10:39 AM 01/21/2022   11:57 AM  Readmission Risk Prevention Plan  Transportation Screening Complete Complete  HRI or Home Care Consult  Complete  Social Work Consult for [place] Planning/Counseling  Complete  Palliative Care Screening  Not Applicable  Medication Review Press photographer) Complete Complete  PCP or Specialist appointment within 3-5 days of discharge Not Complete   HRI or [place] Complete   Palliative Care Screening Not Complete   [place] Not Applicable

## 2022-03-29 NOTE — Progress Notes (Signed)
Nsg Discharge Note  Admit Date:  03/27/2022 Discharge date: 03/29/2022   [place] to be D/C'd Home per MD order.  AVS completed.   Patient/caregiver able to verbalize understanding.  Discharge Medication: Allergies as of 03/29/2022   No Known Allergies      Medication List     STOP taking these medications    benzonatate 100 MG capsule Commonly known as: TESSALON   CARBOPLATIN IV   clotrimazole-betamethasone cream Commonly known as: LOTRISONE   dextromethorphan 30 MG/5ML liquid Commonly known as: DELSYM   lovastatin 20 MG tablet Commonly known as: MEVACOR   PACLITAXEL IV   PEMBROLIZUMAB IV       TAKE these medications    acetaminophen 500 MG tablet Commonly known as: TYLENOL Take 500-1,000 mg by mouth every 6 (six) hours as needed for moderate pain.   albuterol 108 (90 Base) MCG/ACT inhaler Commonly known as: VENTOLIN HFA 2 puffs every 4 hours as needed if you can't catch your breath What changed:  how much to take how to take this when to take this reasons to take this additional instructions   apixaban 5 MG Tabs tablet Commonly known as: ELIQUIS Take 1 tablet (5 mg total) by mouth 2 (two) times daily.   buPROPion 150 MG 12 hr tablet Commonly known as: WELLBUTRIN SR Take 150 mg by mouth daily.   CALCIUM 600 + D PO Take 1 tablet by mouth daily.   diltiazem 30 MG tablet Commonly known as: CARDIZEM Take 30 mg by mouth as needed (heart rate).   feeding supplement Liqd Take 237 mLs by mouth 2 (two) times daily between meals.   fish oil-omega-3 fatty acids 1000 MG capsule Take 1 g by mouth daily.   fluticasone 50 MCG/ACT nasal spray Commonly known as: FLONASE Place 2 sprays into the nose daily.   levofloxacin 750 MG tablet Commonly known as: Levaquin Take 1 tablet (750 mg total) by mouth daily for 14 days.   lidocaine 5 % Commonly known as: Lidoderm Place 1 patch onto the skin daily. Remove and Discard patch within 12  hours. What changed:  when to take this reasons to take this   lidocaine-prilocaine cream Commonly known as: EMLA Apply a small amount to port a cath site and cover with plastic wrap 1 hour prior to infusion appointments   loperamide 2 MG capsule Commonly known as: IMODIUM Take 1 capsule (2 mg total) by mouth every 6 (six) hours as needed for diarrhea or loose stools.   Loratadine 10 MG Caps Take 10 mg by mouth daily.   Magnesium Oxide 400 MG Caps Take 1 capsule (400 mg total) by mouth daily for 7 days.   megestrol 400 MG/10ML suspension Commonly known as: MEGACE Take 10 mLs (400 mg total) by mouth 2 (two) times daily.   metoprolol tartrate 50 MG tablet Commonly known as: LOPRESSOR Take 1 tablet (50 mg total) by mouth 2 (two) times daily.   midodrine 10 MG tablet Commonly known as: PROAMATINE Take 1 tablet (10 mg total) by mouth 3 (three) times daily with meals. What changed:  medication strength how much to take   multivitamin tablet Take 1 tablet by mouth daily.   niacin 500 MG ER tablet Commonly known as: VITAMIN B3 Take 500 mg by mouth daily.   nystatin 100000 UNIT/ML suspension Commonly known as: MYCOSTATIN Take 15 mLs (1,500,000 Units total) by mouth 4 (four) times daily. Swish and swallow   oxyBUTYnin Chloride 2.5 MG Tabs Take 2.5  mg by mouth every 8 (eight) hours as needed for bladder spasms.   oxyCODONE 20 mg 12 hr tablet Commonly known as: OXYCONTIN Take 1 tablet (20 mg total) by mouth every 12 (twelve) hours.   Oxycodone HCl 10 MG Tabs Take 1 tablet (10 mg total) by mouth every 4 (four) hours as needed.   polyethylene glycol 17 g packet Commonly known as: MIRALAX / GLYCOLAX Take 17 g by mouth daily as needed for moderate constipation.   potassium chloride SA 20 MEQ tablet Commonly known as: KLOR-CON M Take 2 tablets (40 mEq total) by mouth daily.   prochlorperazine 10 MG tablet Commonly known as: COMPAZINE Take 1 tablet (10 mg total) by  mouth every 6 (six) hours as needed for nausea or vomiting.   saccharomyces boulardii 250 MG capsule Commonly known as: FLORASTOR Take 1 capsule (250 mg total) by mouth 2 (two) times daily.   sennosides-docusate sodium 8.6-50 MG tablet Commonly known as: SENOKOT-S Take 2 tablets by mouth 2 (two) times daily.   valACYclovir 500 MG tablet Commonly known as: VALTREX Take 500 mg by mouth daily.        Discharge Assessment: Vitals:   03/29/22 0818 03/29/22 1502  BP: 97/73 109/75  Pulse: (!) 112 98  Resp:  18  Temp:  97.9 F (36.6 C)  SpO2: 97% 98%   Skin clean, dry and intact without evidence of skin break down, no evidence of skin tears noted. IV catheter discontinued intact. Site without signs and symptoms of complications - no redness or edema noted at insertion site, patient denies c/o pain - only slight tenderness at site.  Dressing with slight pressure applied.  D/c Instructions-Education: Discharge instructions given to patient/family with verbalized understanding. D/c education completed with patient/family including follow up instructions, medication list, d/c activities limitations if indicated, with other d/c instructions as indicated by MD - patient able to verbalize understanding, all questions fully answered. Patient instructed to return to ED, call 911, or call MD for any changes in condition.  Patient escorted via [place], and D/C home via private auto.  Kathie Rhodes, RN 03/29/2022 5:18 PM

## 2022-03-29 NOTE — Progress Notes (Signed)
The beneficiary is confined to a single room and unable to ambulate safety to bathroom facilities outside of the single room.    Trevon Strothers, Clydene Pugh, LCSW

## 2022-03-29 NOTE — Care Management Important Message (Signed)
Important Message  Patient Details  Name: Alfred Brown MRN: [id number] Date of Birth: 04-28-1950   Medicare Important Message Given:  Yes     Tommy Medal 03/29/2022, 1:48 PM

## 2022-03-29 NOTE — Progress Notes (Signed)
  X-cover Note: Called by Physiological scientist. Pt back into SVT. Unclear if pt's hypomagnesemia was treated yesterday. Will order stat BMP and Mg level. Ordered 5 mg IV lopressor.   Kristopher Oppenheim, DO [facility]

## 2022-03-29 NOTE — Discharge Summary (Signed)
Alfred Brown, is a 71 y.o. male  DOB 1951/03/22  MRN [id number].  Admission date:  03/27/2022  Admitting Physician  Roxan Hockey, MD  Discharge Date:  03/29/2022   Primary MD  Redmond School, MD  Recommendations for primary care physician for things to follow:   1)follow up with [facility]-for possible palliative or hospice services  Admission Diagnosis  SVT (supraventricular tachycardia) [I47.10] Generalized weakness [R53.1] Paroxysmal SVT (supraventricular tachycardia) [I47.10]   Discharge Diagnosis  SVT (supraventricular tachycardia) [I47.10] Generalized weakness [R53.1] Paroxysmal SVT (supraventricular tachycardia) [I47.10]    Principal Problem:   SVT (supraventricular tachycardia) Active Problems:   Squamous cell lung cancer, left (HCC)   Bacteremia due to Pseudomonas   GERD (gastroesophageal reflux disease)   Chronic lymphocytic leukemia ([place])   Metastasis to bone (HCC)   Paroxysmal SVT (supraventricular tachycardia)      Past Medical History:  Diagnosis Date   Aortic valve disorder    Mild insufficiency   Chronic lymphocytic leukemia ([place])    01/2012: WBC-90.2, H&H-15.1/45.3, platelets-185 03/31/12: Verified with Dr. Tressie Stalker that no precautions or modification of medical regime are required prior to orthopaedic surgery.    CLL (chronic lymphocytic leukemia) (HCC)    CMV (cytomegalovirus) (HCC)    COPD (chronic obstructive pulmonary disease) (HCC)    Depression with anxiety    DJD (degenerative joint disease)    GERD (gastroesophageal reflux disease)    Hyperlipidemia    Hypogammaglobulinemia ([place]) 09/23/2019   Lipoma    left chest wall   Palpitations 2004   PVCs; borderline stress nuclear in 2004, negative in 2007; Echo 2007; AV-sclerotic, very mild AI   Pneumonia    Right bundle branch block    + left posterior fascicular block   Tobacco abuse    80 pack years     Past Surgical History:  Procedure Laterality Date   COLONOSCOPY  01/31/2012   Negative screening study   GANGLION CYST EXCISION  04/02/1995   left wrist   IR IMAGING GUIDED PORT INSERTION  01/14/2022   LIPOMA EXCISION Left 10/11/2021   chest wall   PORT-A-CATH REMOVAL Right 03/14/2022   Procedure: MINOR REMOVAL PORT-A-CATH;  Surgeon: Virl Cagey, MD;  Location: [facility];  Service: General;  Laterality: Right;   ROTATOR CUFF REPAIR  04/02/1995   right   SHOULDER ARTHROSCOPY WITH SUBACROMIAL DECOMPRESSION  04/09/2012   Procedure: SHOULDER ARTHROSCOPY WITH SUBACROMIAL DECOMPRESSION;  Surgeon: Ninetta Lights, MD;  Location: [facility];  Service: Orthopedics;  Laterality: Left;  LEFT SHOULDER ARTHROSCOPY, SUBACROMIAL DECOMPRESSION, PARTIAL ACROMIOPLASTY WITH CORACOACROMIAL RELEASE, DISTAL CLAVICULECTOMY WITH ROTATOR CUFF REPAIR, DEBRIDEMENT OF LABRUM     HPI  from the history and physical done on the day of admission:    CC: weakness HPI: 71 year old male history of PSVT, stage IV squamous cell lung cancer, history of CLL, recent Pseudomonas bacteremia presents to the ER today with weakness.  Patient was unable to get outpatient IV fluids arranged through home health.  Appears that the family called  oncology and was told to bring him to the ER.   On arrival to the ER, he was in a supraventricular tachycardia.  He required IV fluids and was ultimately given 2.5 mg of IV Lopressor.  This terminated his SVT.   Labs showed his white count is increased to approximately 130,000.  When he was in the hospital 2 weeks ago his white count was 44,000.   CTPA has shown increase in his tumor burden now measuring 7.3 x 4.4 x 3.9 cm.  Previously it was 3.2 x 3.8 x 2.0 cm.  There is also appears to be tumor invasion into the left main pulmonary artery.   EDP discussed the case with oncology who felt the patient could stay at [facility] for treatment.  Formal oncology  consult was requested.   [facility] contacted for admission.    ED Course: SVT on arrival to ER. Treated with IVF and IV lopressor. WBC >130K    Hospital Course:     Brief Summary:- 71 year old male history of PSVT, stage IV squamous cell lung cancer, history of CLL, recent Pseudomonas bacteremia admitted on 03/28/2022 with paroxysmal ventricular supraventricular tachycardia with persistent tachycardia and hypotension   A/p 1)PSVT--- metoprolol helps patient's tachycardia however blood pressure has been soft so continue midodrine for pressure support -Without metoprolol patient becomes tachycardic -Metoprolol and midodrine dose adjusted -Magnesium and potassium replaced   2) Pseudomonas bacteremia----PTA was on Levaquin -Prolonged QT noted so we treated with cefepime to complete treatment last dose will be 03/29/2022   3) metastatic lung cancer--- progressive, imaging study shows progression of underlying metastatic cancer with invasion into the left main pulmonary artery -Overall prognosis is poor -Patient and wife verbalized understanding of poor prognosis -To follow-up with oncologist Dr. Delton Coombes and follow-up with palliative care team postdischarge -Apparently did not tolerate chemo -As per patient's wife Beryle Flock is currently on hold -Oncology consult from Dr. Delton Coombes appreciated   4)H/o CLL--- also recently treated with Granix significant leukocytosis noted  5) hypomagnesemia/hypokalemia--replaced  6)Social/Ethics-conference at bedside on 03/29/2022 with patient, patient's wife patient's son and daughter-in-law -Family trying to decide between palliative care versus hospice care -For now discharge home with home health services -Dr. Delton Coombes came and spoke with family as well, he recommended transition to hospice  7)Adult failure to thrive--Megace as prescribed, nutritional supplements advised   Discharge Condition: Overall prognosis is poor  Follow  UP   Follow-up Information     Redmond School, MD Follow up.   Specialty: Internal Medicine Contact information: [address] [place] [address] [phone]                 Consults obtained -oncology  Diet and Activity recommendation:  As advised  Discharge Instructions    Discharge Instructions     Call MD for:  difficulty breathing, headache or visual disturbances   Complete by: As directed    Call MD for:  persistant dizziness or light-headedness   Complete by: As directed    Call MD for:  persistant nausea and vomiting   Complete by: As directed    Call MD for:  severe uncontrolled pain   Complete by: As directed    Call MD for:  temperature >100.4   Complete by: As directed    Diet general   Complete by: As directed    Discharge instructions   Complete by: As directed    1)follow up with [facility]-for possible palliative or hospice services   Increase activity  slowly   Complete by: As directed    No wound care   Complete by: As directed         Discharge Medications     Allergies as of 03/29/2022   No Known Allergies      Medication List     STOP taking these medications    benzonatate 100 MG capsule Commonly known as: TESSALON   CARBOPLATIN IV   clotrimazole-betamethasone cream Commonly known as: LOTRISONE   dextromethorphan 30 MG/5ML liquid Commonly known as: DELSYM   levofloxacin 750 MG tablet Commonly known as: Levaquin   lovastatin 20 MG tablet Commonly known as: MEVACOR   PACLITAXEL IV   PEMBROLIZUMAB IV       TAKE these medications    acetaminophen 500 MG tablet Commonly known as: TYLENOL Take 500-1,000 mg by mouth every 6 (six) hours as needed for moderate pain.   albuterol 108 (90 Base) MCG/ACT inhaler Commonly known as: VENTOLIN HFA 2 puffs every 4 hours as needed if you can't catch your breath What changed:  how much to take how to take this when to take this reasons to take  this additional instructions   apixaban 5 MG Tabs tablet Commonly known as: ELIQUIS Take 1 tablet (5 mg total) by mouth 2 (two) times daily.   buPROPion 150 MG 12 hr tablet Commonly known as: WELLBUTRIN SR Take 150 mg by mouth daily.   CALCIUM 600 + D PO Take 1 tablet by mouth daily.   diltiazem 30 MG tablet Commonly known as: CARDIZEM Take 30 mg by mouth as needed (heart rate).   feeding supplement Liqd Take 237 mLs by mouth 2 (two) times daily between meals.   fish oil-omega-3 fatty acids 1000 MG capsule Take 1 g by mouth daily.   fluticasone 50 MCG/ACT nasal spray Commonly known as: FLONASE Place 2 sprays into the nose daily.   lidocaine 5 % Commonly known as: Lidoderm Place 1 patch onto the skin daily. Remove and Discard patch within 12 hours. What changed:  when to take this reasons to take this   lidocaine-prilocaine cream Commonly known as: EMLA Apply a small amount to port a cath site and cover with plastic wrap 1 hour prior to infusion appointments   loperamide 2 MG capsule Commonly known as: IMODIUM Take 1 capsule (2 mg total) by mouth every 6 (six) hours as needed for diarrhea or loose stools.   Loratadine 10 MG Caps Take 10 mg by mouth daily.   Magnesium Oxide 400 MG Caps Take 1 capsule (400 mg total) by mouth daily for 7 days.   megestrol 400 MG/10ML suspension Commonly known as: MEGACE Take 10 mLs (400 mg total) by mouth 2 (two) times daily.   metoprolol tartrate 50 MG tablet Commonly known as: LOPRESSOR Take 1 tablet (50 mg total) by mouth 2 (two) times daily.   midodrine 10 MG tablet Commonly known as: PROAMATINE Take 1 tablet (10 mg total) by mouth 3 (three) times daily with meals. What changed:  medication strength how much to take   multivitamin tablet Take 1 tablet by mouth daily.   niacin 500 MG ER tablet Commonly known as: VITAMIN B3 Take 500 mg by mouth daily.   nystatin 100000 UNIT/ML suspension Commonly known as:  MYCOSTATIN Take 15 mLs (1,500,000 Units total) by mouth 4 (four) times daily. Swish and swallow   oxyBUTYnin Chloride 2.5 MG Tabs Take 2.5 mg by mouth every 8 (eight) hours as needed for bladder spasms.  oxyCODONE 20 mg 12 hr tablet Commonly known as: OXYCONTIN Take 1 tablet (20 mg total) by mouth every 12 (twelve) hours.   Oxycodone HCl 10 MG Tabs Take 1 tablet (10 mg total) by mouth every 4 (four) hours as needed.   polyethylene glycol 17 g packet Commonly known as: MIRALAX / GLYCOLAX Take 17 g by mouth daily as needed for moderate constipation.   potassium chloride SA 20 MEQ tablet Commonly known as: KLOR-CON M Take 2 tablets (40 mEq total) by mouth daily.   prochlorperazine 10 MG tablet Commonly known as: COMPAZINE Take 1 tablet (10 mg total) by mouth every 6 (six) hours as needed for nausea or vomiting.   saccharomyces boulardii 250 MG capsule Commonly known as: FLORASTOR Take 1 capsule (250 mg total) by mouth 2 (two) times daily.   sennosides-docusate sodium 8.6-50 MG tablet Commonly known as: SENOKOT-S Take 2 tablets by mouth 2 (two) times daily.   valACYclovir 500 MG tablet Commonly known as: VALTREX Take 500 mg by mouth daily.        Major procedures and Radiology Reports - PLEASE review detailed and final reports for all details, in brief -   CT Angio Chest Pulmonary Embolism (PE) W or WO Contrast  Result Date: 03/28/2022 CLINICAL DATA:  Shortness of breath and weakness EXAM: CT ANGIOGRAPHY CHEST WITH CONTRAST TECHNIQUE: Multidetector CT imaging of the chest was performed using the standard protocol during bolus administration of intravenous contrast. Multiplanar CT image reconstructions and MIPs were obtained to evaluate the vascular anatomy. RADIATION DOSE REDUCTION: This exam was performed according to the departmental dose-optimization program which includes automated exposure control, adjustment of the mA and/or kV according to patient size and/or use of  iterative reconstruction technique. CONTRAST:  53mL OMNIPAQUE IOHEXOL 350 MG/ML SOLN COMPARISON:  CT chest 01/27/2022 FINDINGS: Cardiovascular: Satisfactory opacification of the central pulmonary arteries. There is occlusion or near occlusion of the left main pulmonary artery secondary to the left hilar/mediastinal mass. There is complete occlusion of the left upper lobe pulmonary artery. Probable subsegmental pulmonary emboli in the left lower lobe pulmonary arteries (circa series 12/image 59). It is unclear if this represents bland or tumor thrombus. Normal heart size. Small pericardial effusion. Coronary artery and aortic atherosclerotic calcification. Mediastinum/Nodes: Increased size of the heterogenously enhancing mass centered within the left hilum/mediastinum now measuring approximately 7.3 x 4.4 x 3.9 cm, previously 3.2 x 3.8 x 2.0 cm. Slightly increased subcarinal adenopathy. Similar to slightly increased right hilar adenopathy. Lungs/Pleura: Increased size of the left upper lung mass now measuring 3.4 cm in greatest dimension, previously 3.1 cm. The gaseous component of central cavitation has nearly resolved. The mass extends towards the hilum along the peribronchovascular bundle and occludes multiple left upper lobe bronchi and severely narrows multiple lingular bronchi. Interlobular septal thickening along the left hilum is likely due to lymphangitic spread of tumor. The irregular consolidative mass in the posterolateral left upper lobe has decreased in size compatible with an improving infectious/inflammatory etiology or treatment response. Biapical pleural-parenchymal scarring. Upper Abdomen: No acute abnormality. Musculoskeletal: Lytic lesion in the T4 vertebral body extending into the right posterior elements compatible with metastasis. No definite extension into the spinal canal however this is poorly evaluated with CT. Destructive lytic lesion in the posterior right second and anterolateral right  fourth rib compatible with metastasis and similar to prior. Review of the MIP images confirms the above findings. IMPRESSION: Findings compatible with disease progression with increased size of the left upper lobe mass and  enlarging left hilar/mediastinal bulky adenopathy. There is severe narrowing and possible occlusion of the left main pulmonary artery secondary to compression by the mass with suggestion of invasion. Nonocclusive emboli extend into the segmental branches of the left lower lobe. Lytic lesion in the T4 vertebral body extending into the posterior elements has increased since 01/27/2022. Additional rib metastases are similar. Electronically Signed   By: Placido Sou M.D.   On: 03/28/2022 03:29   CT HEAD WO CONTRAST (5MM)  Result Date: 03/28/2022 CLINICAL DATA:  Altered mental status. EXAM: CT HEAD WITHOUT CONTRAST TECHNIQUE: Contiguous axial images were obtained from the base of the skull through the vertex without intravenous contrast. RADIATION DOSE REDUCTION: This exam was performed according to the departmental dose-optimization program which includes automated exposure control, adjustment of the mA and/or kV according to patient size and/or use of iterative reconstruction technique. COMPARISON:  Head CT dated 03/02/2022. FINDINGS: Brain: The ventricles and sulci are appropriate size for the patient's age. The gray-white matter discrimination is preserved. There is no acute intracranial hemorrhage. No mass effect or midline shift. No extra-axial fluid collection. Vascular: No hyperdense vessel or unexpected calcification. Skull: Normal. Negative for fracture or focal lesion. Sinuses/Orbits: No acute finding. Other: None IMPRESSION: No acute intracranial pathology. Electronically Signed   By: Anner Crete M.D.   On: 03/28/2022 00:25   DG Chest 2 View  Result Date: 03/27/2022 CLINICAL DATA:  Shortness of breath. EXAM: CHEST - 2 VIEW COMPARISON:  Chest radiograph dated 03/14/2022.  FINDINGS: Faint area of airspace opacity primarily involving the left upper lobe most consistent with pneumonia. Clinical correlation and follow-up recommended. No pleural effusion or pneumothorax the the cardiac silhouette is within limits. Atherosclerotic calcification of the aorta. No acute osseous pathology. IMPRESSION: Left upper lobe pneumonia.  Follow-up to resolution recommended. Electronically Signed   By: Anner Crete M.D.   On: 03/27/2022 20:52   DG Chest Port 1 View  Result Date: 03/14/2022 CLINICAL DATA:  Port-A-Cath removal. EXAM: PORTABLE CHEST 1 VIEW COMPARISON:  March 02, 2022.  January 27, 2022. FINDINGS: Stable cardiomediastinal silhouette. Right internal jugular Port-A-Cath has been removed. No pneumothorax or pleural effusion is noted. Stable left upper lobe opacities are noted consistent with pneumonia. Destructive lesion seen in right fourth rib is not well visualized on this radiograph. IMPRESSION: Interval removal of right internal jugular Port-A-Cath. Stable left upper lobe opacities as described above. Electronically Signed   By: Marijo Conception M.D.   On: 03/14/2022 15:24   [place] EKG SITE RITE  Result Date: 03/14/2022 If Site Rite image not attached, placement could not be confirmed due to current cardiac rhythm.  CT ABDOMEN PELVIS W CONTRAST  Result Date: 03/02/2022 CLINICAL DATA:  Lung cancer.  Sepsis.  Evaluate for metastasis. EXAM: CT ABDOMEN AND PELVIS WITH CONTRAST TECHNIQUE: Multidetector CT imaging of the abdomen and pelvis was performed using the standard protocol following bolus administration of intravenous contrast. RADIATION DOSE REDUCTION: This exam was performed according to the departmental dose-optimization program which includes automated exposure control, adjustment of the mA and/or kV according to patient size and/or use of iterative reconstruction technique. CONTRAST:  169mL OMNIPAQUE IOHEXOL 300 MG/ML  SOLN COMPARISON:  CT abdomen pelvis dated  12/18/2021. FINDINGS: Evaluation of this exam is limited due to respiratory motion artifact. Lower chest: The visualized lung bases are clear. No intra-abdominal free air or free fluid. Hepatobiliary: The liver is unremarkable. Mild periportal edema. The gallbladder is unremarkable. Pancreas: There is mild stranding of the fat  plane surrounding the pancreas most consistent with acute pancreatitis. Correlation with pancreatic enzymes recommended. No drainable fluid collection/abscess or pseudocyst. Spleen: Normal in size without focal abnormality. Adrenals/Urinary Tract: Mild stranding of the fat plane adjacent to the adrenal glands, nonspecific, possibly related to adrenal stress. Clinical correlation is recommended. The kidneys, visualized ureters appear unremarkable. The urinary bladder is decompressed around a Foley catheter. Air within the bladder introduced via the catheter. Stomach/Bowel: There is sigmoid diverticulosis without active inflammatory changes. Moderate stool throughout the colon. There is no bowel obstruction or active inflammation. The appendix is normal. Vascular/Lymphatic: Advanced aortoiliac atherosclerotic disease. The IVC is unremarkable. No portal venous gas. Top-normal peripancreatic lymph nodes, reactive. Reproductive: Mildly enlarged prostate gland with median lobe hypertrophy. Other: None Musculoskeletal: There is an infiltrative predominantly lytic mass involving the right iliac bone adjacent to the SI joint measuring approximately 5.7 x 3.2 cm. Degenerative changes of the spine. Bilateral L4 pars defects with grade 1 L4-L5 anterolisthesis. No acute osseous pathology. IMPRESSION: 1. Acute pancreatitis.  No abscess or pseudocyst. 2. Sigmoid diverticulosis. No bowel obstruction. Normal appendix. 3. Right iliac bone infiltrative lytic mass most consistent with metastasis. 4.  Aortic Atherosclerosis (ICD10-I70.0). Electronically Signed   By: Anner Crete M.D.   On: 03/02/2022 20:19    DG Chest Port 1 View  Result Date: 03/02/2022 CLINICAL DATA:  Sepsis EXAM: PORTABLE CHEST 1 VIEW COMPARISON:  Previous studies including the examination of 01/27/2022 FINDINGS: Cardiac size is within normal limits. There is interval improvement in the aeration in patchy infiltrates in left lung. There is small residual infiltrate in the left upper lobe. There are no new infiltrates or signs of pulmonary edema. There is no pleural effusion or pneumothorax. Anterior right fourth rib is not visualized. In the previous CT, a lytic lesion was seen in the anterior right fourth rib suggesting metastatic disease. Tip of right IJ chest port is seen in superior vena cava close to right atrium. IMPRESSION: There is almost complete clearing of patchy infiltrates in left lung with small residual infiltrate in the left upper lung fields suggesting resolving multifocal pneumonia. There are no new focal infiltrates. There is no pleural effusion or pneumothorax. There is expansile lesion in the anterior right fourth rib suggesting malignant neoplastic process. Electronically Signed   By: Elmer Picker M.D.   On: 03/02/2022 18:37   CT Head Wo Contrast  Result Date: 03/02/2022 CLINICAL DATA:  Altered mental status.  Metastatic lung carcinoma. EXAM: CT HEAD WITHOUT CONTRAST TECHNIQUE: Contiguous axial images were obtained from the base of the skull through the vertex without intravenous contrast. RADIATION DOSE REDUCTION: This exam was performed according to the departmental dose-optimization program which includes automated exposure control, adjustment of the mA and/or kV according to patient size and/or use of iterative reconstruction technique. COMPARISON:  01/19/2022 FINDINGS: Brain: No evidence of intracranial hemorrhage, acute infarction, hydrocephalus, extra-axial collection, or mass lesion/mass effect. Stable mild diffuse cerebral and cerebellar atrophy. Vascular:  No hyperdense vessel or other acute findings.  Skull: No evidence of fracture or other significant bone abnormality. Sinuses/Orbits:  No acute findings. Other: None. IMPRESSION: No acute intracranial abnormality. Stable mild diffuse cerebral and cerebellar atrophy. Electronically Signed   By: Marlaine Hind M.D.   On: 03/02/2022 14:45    Micro Results   Today   [place] today has no new complaints -Wife, son and daughter-in-law at bedside -Oral intake remains poor No fevers, no chills -No nausea or vomiting   Patient  has been seen and examined prior to discharge   Objective   Blood pressure 109/75, pulse 98, temperature 97.9 F (36.6 C), resp. rate 18, height 6\' 1"  (1.854 m), weight 57.9 kg, SpO2 98 %.   Intake/Output Summary (Last 24 hours) at 03/29/2022 1910 Last data filed at 03/29/2022 1500 Gross per 24 hour  Intake 553.55 ml  Output --  Net 553.55 ml   Exam Gen:- Awake Alert, no acute distress , frail, cachectic and chronically ill-appearing HEENT:- [place].AT, No sclera icterus Neck-Supple Neck,No JVD,.  Lungs-  CTAB , good air movement bilaterally CV- S1, S2 normal, regular Abd-  +ve B.Sounds, Abd Soft, No tenderness,    Extremity/Skin:- No  edema,   good pulses Psych-affect is appropriate, oriented x3 Neuro-generalized weakness, no new focal deficits, no tremors    Data Review   CBC w Diff:  Lab Results  Component Value Date   WBC 129.4 (HH) 03/27/2022   HGB 11.6 (L) 03/27/2022   HCT 38.4 (L) 03/27/2022   PLT 463 (H) 03/27/2022   LYMPHOPCT 76 03/20/2022   BANDSPCT 3 03/14/2022   MONOPCT 2 03/20/2022   EOSPCT 0 03/20/2022   BASOPCT 1 03/20/2022    CMP:  Lab Results  Component Value Date   NA 137 03/29/2022   K 3.2 (L) 03/29/2022   CL 107 03/29/2022   CO2 21 (L) 03/29/2022   BUN 12 03/29/2022   CREATININE 0.82 03/29/2022   PROT 6.6 03/27/2022   ALBUMIN 2.4 (L) 03/28/2022   BILITOT 0.6 03/27/2022   ALKPHOS 86 03/27/2022   AST 18 03/27/2022   ALT 14 03/27/2022  .  Total  Discharge time is about 33 minutes  Roxan Hockey M.D on 03/29/2022 at 7:10 PM  Go to [email or web address] -  for contact info  [facility] - Office  [phone]

## 2022-03-29 NOTE — Discharge Instructions (Signed)
1)follow up with [facility]-for possible palliative or hospice services

## 2022-03-29 NOTE — Consult Note (Signed)
[facility] Consultation Oncology  Name: Alfred Brown      MRN: [id number]    Location: A303/A303-01  Date: 03/29/2022 Time:11:18 AM   REFERRING PHYSICIAN: Dr. Joesph Fillers  REASON FOR CONSULT: Metastatic lung cancer and CLL    HISTORY OF PRESENT ILLNESS: Alfred Brown is a 71 year old pleasant male known to me from office visits.  He has metastatic squamous cell lung cancer to the bones and has received last cycle of Keytruda on 02/26/2022.  He was progressively getting weaker and came to the ER on 03/28/2022 with paroxysmal SVT and hypotension.  CT angio chest done in the ER on 03/28/2022 showed increased size of heterogeneously enhancing mass centered within the left hilum/mediastinum measuring 7.3 x 4.4 x 3.9 cm.  Increased size of the left upper lobe lung mass measuring 3.4 cm.  Lytic lesion in the T4 vertebral body extending into the right posterior elements.  Destructive lytic lesion in the posterior right second and anterolateral right fourth rib.  There is severe narrowing and possible occlusion of the left main pulmonary artery secondary to compression by the mass with suggestion of invasion.  He previously completed palliative XRT to the bone lesions on 01/02/2022.  He is resting comfortably in the bed.  Wife at bedside.  PAST MEDICAL HISTORY:   Past Medical History:  Diagnosis Date   Aortic valve disorder    Mild insufficiency   Chronic lymphocytic leukemia ([place])    01/2012: WBC-90.2, H&H-15.1/45.3, platelets-185 03/31/12: Verified with Dr. Tressie Stalker that no precautions or modification of medical regime are required prior to orthopaedic surgery.    CLL (chronic lymphocytic leukemia) (HCC)    CMV (cytomegalovirus) (HCC)    COPD (chronic obstructive pulmonary disease) (HCC)    Depression with anxiety    DJD (degenerative joint disease)    GERD (gastroesophageal reflux disease)    Hyperlipidemia    Hypogammaglobulinemia ([place]) 09/23/2019   Lipoma    left chest wall   Palpitations  2004   PVCs; borderline stress nuclear in 2004, negative in 2007; Echo 2007; AV-sclerotic, very mild AI   Pneumonia    Right bundle branch block    + left posterior fascicular block   Tobacco abuse    80 pack years    ALLERGIES: No Known Allergies    MEDICATIONS: I have reviewed the patient's current medications.     PAST SURGICAL HISTORY Past Surgical History:  Procedure Laterality Date   COLONOSCOPY  01/31/2012   Negative screening study   GANGLION CYST EXCISION  04/02/1995   left wrist   IR IMAGING GUIDED PORT INSERTION  01/14/2022   LIPOMA EXCISION Left 10/11/2021   chest wall   PORT-A-CATH REMOVAL Right 03/14/2022   Procedure: MINOR REMOVAL PORT-A-CATH;  Surgeon: Virl Cagey, MD;  Location: [facility];  Service: General;  Laterality: Right;   ROTATOR CUFF REPAIR  04/02/1995   right   SHOULDER ARTHROSCOPY WITH SUBACROMIAL DECOMPRESSION  04/09/2012   Procedure: SHOULDER ARTHROSCOPY WITH SUBACROMIAL DECOMPRESSION;  Surgeon: Ninetta Lights, MD;  Location: [facility];  Service: Orthopedics;  Laterality: Left;  LEFT SHOULDER ARTHROSCOPY, SUBACROMIAL DECOMPRESSION, PARTIAL ACROMIOPLASTY WITH CORACOACROMIAL RELEASE, DISTAL CLAVICULECTOMY WITH ROTATOR CUFF REPAIR, DEBRIDEMENT OF LABRUM    FAMILY HISTORY: Family History  Problem Relation Age of Onset   Stroke Mother    Heart attack Father    Cancer Sister        colon   Colon cancer Sister    Cancer Brother  2 brothers died with lung cancer    SOCIAL HISTORY:  reports that he quit smoking about 3 months ago. His smoking use included cigarettes. He started smoking about 58 years ago. He has a 45.00 pack-year smoking history. He has never used smokeless tobacco. He reports that he does not drink alcohol and does not use drugs.  PERFORMANCE STATUS: The patient's performance status is 3 - Symptomatic, >50% confined to bed  PHYSICAL EXAM: Most Recent Vital Signs: Blood pressure 97/73, pulse (!) 112,  temperature (!) 97.5 F (36.4 C), resp. rate 16, height _0  (1.854 m), weight 127 lb 10.3 oz (57.9 kg), SpO2 97 %. BP 97/73 (BP Location: Right Arm)   Pulse (!) 112   Temp (!) 97.5 F (36.4 C)   Resp 16   Ht _1  (1.854 m)   Wt 127 lb 10.3 oz (57.9 kg)   SpO2 97%   BMI 16.84 kg/m  General appearance: alert, cooperative, and appears stated age Lungs:  Bilateral air entry. Heart: regular rate and rhythm Extremities:  Trace edema bilaterally  LABORATORY DATA:  Results for orders placed or performed during the hospital encounter of 03/27/22 (from the past 48 hour(s))  CBC     Status: Abnormal   Collection Time: 03/27/22  9:18 PM  Result Value Ref Range   WBC 129.4 (HH) 4.0 - 10.5 K/uL    Comment: This critical result has verified and been called to Rachael Fee by Eula Listen on 12 27 2023 at 2205, and has been read back.  This critical result has verified and been called to Rachael Fee by Eula Listen on 12 27 2023 at 2207, and has been read back.  WHITE COUNT CONFIRMED ON SMEAR CORRECTED ON 12/28 AT 0546: PREVIOUSLY REPORTED AS 129.4 This critical result has verified and been called to Rachael Fee by Eula Listen on 12 27 2023 at 2205, and has been read back.  This critical result has verified and been called to Rachael Fee by Eula Listen on 12 27 2023 at 2207, and has been read back.     RBC 4.15 (L) 4.22 - 5.81 MIL/uL   Hemoglobin 11.6 (L) 13.0 - 17.0 g/dL   HCT 38.4 (L) 39.0 - 52.0 %   MCV 92.5 80.0 - 100.0 fL   MCH 28.0 26.0 - 34.0 pg   MCHC 30.2 30.0 - 36.0 g/dL   RDW 17.5 (H) 11.5 - 15.5 %   Platelets 463 (H) 150 - 400 K/uL   nRBC 0.0 0.0 - 0.2 %    Comment: Performed at [facility], [address] [place], [place] [address]  Basic metabolic panel     Status: Abnormal   Collection Time: 03/27/22  9:18 PM  Result Value Ref Range   Sodium 139 135 - 145 mmol/L   Potassium 5.2 (H) 3.5 - 5.1 mmol/L   Chloride 108 98 - 111 mmol/L   CO2 21 (L) 22 - 32 mmol/L    Glucose, Bld 127 (H) 70 - 99 mg/dL    Comment: Glucose reference range applies only to samples taken after fasting for at least 8 hours.   BUN 21 8 - 23 mg/dL   Creatinine, Ser 1.14 0.61 - 1.24 mg/dL   Calcium 10.0 8.9 - 10.3 mg/dL   GFR, Estimated >60 >60 mL/min    Comment: (NOTE) Calculated using the CKD-EPI Creatinine Equation (2021)    Anion gap 10 5 - 15    Comment: Performed at [facility],  [address] [place], [place] [address]  Troponin I (High Sensitivity)     Status: None   Collection Time: 03/27/22  9:18 PM  Result Value Ref Range   Troponin I (High Sensitivity) 11 <18 ng/L    Comment: (NOTE) Elevated high sensitivity troponin I (hsTnI) values and significant  changes across serial measurements may suggest ACS but many other  chronic and acute conditions are known to elevate hsTnI results.  Refer to the "Links" section for chest pain algorithms and additional  guidance. Performed at [facility], [address] [place], [place] [address]   Hepatic function panel     Status: Abnormal   Collection Time: 03/27/22  9:18 PM  Result Value Ref Range   Total Protein 6.6 6.5 - 8.1 g/dL   Albumin 3.3 (L) 3.5 - 5.0 g/dL   AST 18 15 - 41 U/L   ALT 14 0 - 44 U/L   Alkaline Phosphatase 86 38 - 126 U/L   Total Bilirubin 0.6 0.3 - 1.2 mg/dL   Bilirubin, Direct 0.2 0.0 - 0.2 mg/dL   Indirect Bilirubin 0.4 0.3 - 0.9 mg/dL    Comment: Performed at [facility], [address], [place], [place] [address]  Blood gas, venous (at [facility] and [facility])     Status: Abnormal   Collection Time: 03/27/22  9:18 PM  Result Value Ref Range   pH, Ven 7.4 7.25 - 7.43   pCO2, Ven 37 (L) 44 - 60 mmHg   pO2, Ven <31 (LL) 32 - 45 mmHg    Comment: CRITICAL RESULT CALLED TO, READ BACK BY AND VERIFIED WITH: C.RUSH AT 2127 ON 12.27.23 BY ADGER J     Bicarbonate 23.0 20.0 - 28.0 mmol/L   Acid-base deficit 1.7 0.0 - 2.0 mmol/L   O2 Saturation 31.5 %   Patient temperature 36.3    Collection site RIGHT  ANTECUBITAL    Drawn by [id number]     Comment: Performed at [facility], [address], [place], [place] [address]  Troponin I (High Sensitivity)     Status: None   Collection Time: 03/27/22 11:06 PM  Result Value Ref Range   Troponin I (High Sensitivity) 9 <18 ng/L    Comment: (NOTE) Elevated high sensitivity troponin I (hsTnI) values and significant  changes across serial measurements may suggest ACS but many other  chronic and acute conditions are known to elevate hsTnI results.  Refer to the "Links" section for chest pain algorithms and additional  guidance. Performed at [facility], [address] [place], [place] [address]   Renal function panel     Status: Abnormal   Collection Time: 03/28/22  8:36 AM  Result Value Ref Range   Sodium 139 135 - 145 mmol/L   Potassium 3.9 3.5 - 5.1 mmol/L    Comment: DELTA CHECK NOTED   Chloride 109 98 - 111 mmol/L   CO2 23 22 - 32 mmol/L   Glucose, Bld 92 70 - 99 mg/dL    Comment: Glucose reference range applies only to samples taken after fasting for at least 8 hours.   BUN 16 8 - 23 mg/dL   Creatinine, Ser 1.01 0.61 - 1.24 mg/dL   Calcium 8.5 (L) 8.9 - 10.3 mg/dL   Phosphorus 3.4 2.5 - 4.6 mg/dL   Albumin 2.4 (L) 3.5 - 5.0 g/dL   GFR, Estimated >60 >60 mL/min    Comment: (NOTE) Calculated using the CKD-EPI Creatinine Equation (2021)    Anion gap 7 5 -  15    Comment: Performed at [facility], [address] [place], [place] [address]  Magnesium     Status: Abnormal   Collection Time: 03/28/22  8:36 AM  Result Value Ref Range   Magnesium 1.5 (L) 1.7 - 2.4 mg/dL    Comment: Performed at [facility], [address], [place], [place] [address]  Basic metabolic panel     Status: Abnormal   Collection Time: 03/29/22  7:08 AM  Result Value Ref Range   Sodium 137 135 - 145 mmol/L   Potassium 3.2 (L) 3.5 - 5.1 mmol/L    Comment: DELTA CHECK NOTED   Chloride 107 98 - 111 mmol/L   CO2 21 (L) 22 - 32 mmol/L   Glucose, Bld 90 70 - 99 mg/dL     Comment: Glucose reference range applies only to samples taken after fasting for at least 8 hours.   BUN 12 8 - 23 mg/dL   Creatinine, Ser 0.82 0.61 - 1.24 mg/dL   Calcium 8.5 (L) 8.9 - 10.3 mg/dL   GFR, Estimated >60 >60 mL/min    Comment: (NOTE) Calculated using the CKD-EPI Creatinine Equation (2021)    Anion gap 9 5 - 15    Comment: Performed at [facility], [address] [place], [place] [address]  Magnesium     Status: Abnormal   Collection Time: 03/29/22  7:08 AM  Result Value Ref Range   Magnesium 1.5 (L) 1.7 - 2.4 mg/dL    Comment: Performed at [facility], [address], [place], [place] [address]      RADIOGRAPHY: CT Angio Chest Pulmonary Embolism (PE) W or WO Contrast  Result Date: 03/28/2022 CLINICAL DATA:  Shortness of breath and weakness EXAM: CT ANGIOGRAPHY CHEST WITH CONTRAST TECHNIQUE: Multidetector CT imaging of the chest was performed using the standard protocol during bolus administration of intravenous contrast. Multiplanar CT image reconstructions and MIPs were obtained to evaluate the vascular anatomy. RADIATION DOSE REDUCTION: This exam was performed according to the departmental dose-optimization program which includes automated exposure control, adjustment of the mA and/or kV according to patient size and/or use of iterative reconstruction technique. CONTRAST:  62m OMNIPAQUE IOHEXOL 350 MG/ML SOLN COMPARISON:  CT chest 01/27/2022 FINDINGS: Cardiovascular: Satisfactory opacification of the central pulmonary arteries. There is occlusion or near occlusion of the left main pulmonary artery secondary to the left hilar/mediastinal mass. There is complete occlusion of the left upper lobe pulmonary artery. Probable subsegmental pulmonary emboli in the left lower lobe pulmonary arteries (circa series 12/image 59). It is unclear if this represents bland or tumor thrombus. Normal heart size. Small pericardial effusion. Coronary artery and aortic atherosclerotic calcification.  Mediastinum/Nodes: Increased size of the heterogenously enhancing mass centered within the left hilum/mediastinum now measuring approximately 7.3 x 4.4 x 3.9 cm, previously 3.2 x 3.8 x 2.0 cm. Slightly increased subcarinal adenopathy. Similar to slightly increased right hilar adenopathy. Lungs/Pleura: Increased size of the left upper lung mass now measuring 3.4 cm in greatest dimension, previously 3.1 cm. The gaseous component of central cavitation has nearly resolved. The mass extends towards the hilum along the peribronchovascular bundle and occludes multiple left upper lobe bronchi and severely narrows multiple lingular bronchi. Interlobular septal thickening along the left hilum is likely due to lymphangitic spread of tumor. The irregular consolidative mass in the posterolateral left upper lobe has decreased in size compatible with an improving infectious/inflammatory etiology or treatment response. Biapical pleural-parenchymal scarring. Upper Abdomen: No acute abnormality. Musculoskeletal: Lytic lesion in the T4  vertebral body extending into the right posterior elements compatible with metastasis. No definite extension into the spinal canal however this is poorly evaluated with CT. Destructive lytic lesion in the posterior right second and anterolateral right fourth rib compatible with metastasis and similar to prior. Review of the MIP images confirms the above findings. IMPRESSION: Findings compatible with disease progression with increased size of the left upper lobe mass and enlarging left hilar/mediastinal bulky adenopathy. There is severe narrowing and possible occlusion of the left main pulmonary artery secondary to compression by the mass with suggestion of invasion. Nonocclusive emboli extend into the segmental branches of the left lower lobe. Lytic lesion in the T4 vertebral body extending into the posterior elements has increased since 01/27/2022. Additional rib metastases are similar. Electronically  Signed   By: Placido Sou M.D.   On: 03/28/2022 03:29   CT HEAD WO CONTRAST (5MM)  Result Date: 03/28/2022 CLINICAL DATA:  Altered mental status. EXAM: CT HEAD WITHOUT CONTRAST TECHNIQUE: Contiguous axial images were obtained from the base of the skull through the vertex without intravenous contrast. RADIATION DOSE REDUCTION: This exam was performed according to the departmental dose-optimization program which includes automated exposure control, adjustment of the mA and/or kV according to patient size and/or use of iterative reconstruction technique. COMPARISON:  Head CT dated 03/02/2022. FINDINGS: Brain: The ventricles and sulci are appropriate size for the patient's age. The gray-white matter discrimination is preserved. There is no acute intracranial hemorrhage. No mass effect or midline shift. No extra-axial fluid collection. Vascular: No hyperdense vessel or unexpected calcification. Skull: Normal. Negative for fracture or focal lesion. Sinuses/Orbits: No acute finding. Other: None IMPRESSION: No acute intracranial pathology. Electronically Signed   By: Anner Crete M.D.   On: 03/28/2022 00:25   DG Chest 2 View  Result Date: 03/27/2022 CLINICAL DATA:  Shortness of breath. EXAM: CHEST - 2 VIEW COMPARISON:  Chest radiograph dated 03/14/2022. FINDINGS: Faint area of airspace opacity primarily involving the left upper lobe most consistent with pneumonia. Clinical correlation and follow-up recommended. No pleural effusion or pneumothorax the the cardiac silhouette is within limits. Atherosclerotic calcification of the aorta. No acute osseous pathology. IMPRESSION: Left upper lobe pneumonia.  Follow-up to resolution recommended. Electronically Signed   By: Anner Crete M.D.   On: 03/27/2022 20:52         ASSESSMENT and PLAN:  1.  Metastatic squamous cell lung cancer to the bones, PD-L1 40%, TMB-high: - Last Keytruda on 02/26/2022. - Reviewed CT angiogram (03/28/2022) reports which showed  disease progression of the left upper lobe mass and enlarging left hilar/mediastinal bulky adenopathy.  There is also severe narrowing and possible occlusion of the left main pulmonary artery due to compression/invasion by the mass.  Nonocclusive emboli extend into the segmental branches and left lower lobe. - I had a prolonged discussion with his wife and recommended hospice. - She tells me that she is not ready for hospice yet.  She wants her husband to spend some quality time with her and and their grandkids for few days.  She understands that he has a limited number of days/weeks to live. - She has outpatient palliative care scheduled to visit him on 04/09/2021. - I will talk to Dr. Joesph Fillers and see if we can bring the palliative service to his home sooner than that.  All questions were answered. The patient knows to call the clinic with any problems, questions or concerns. We can certainly see the patient much sooner if necessary.  Derek Jack

## 2022-03-29 NOTE — Progress Notes (Signed)
   03/29/22 0818  Vitals  BP 97/73  BP Location Right Arm  BP Method Automatic  Patient Position (if appropriate) Lying  Pulse Rate (!) 112  Pulse Rate Source Monitor  Level of Consciousness  Level of Consciousness Alert  MEWS COLOR  MEWS Score Color Yellow  Oxygen Therapy  SpO2 97 %  O2 Device Room Air  Pain Assessment  Pain Scale 0-10  Pain Score 0  PCA/Epidural/Spinal Assessment  Respiratory Pattern Regular  Glasgow Coma Scale  Eye Opening 4  Best Verbal Response (NON-intubated) 5  Modified Verbal Response (INTUBATED) 5  Best Motor Response 6  Glasgow Coma Scale Score (!) 20  MEWS Score  MEWS Temp 0  MEWS Systolic 1  MEWS Pulse 2  MEWS RR 0  MEWS LOC 0  MEWS Score 3  Provider Notification  Provider Name/Title Dr.Courage  Date Provider Notified 03/29/22  Time Provider Notified 0820  Method of Notification Page  Notification Reason Other (Comment) (low BP)  Provider response See new orders;Other (Comment)  Date of Provider Response 03/29/22  Time of Provider Response [phone]

## 2022-03-29 NOTE — TOC Transition Note (Signed)
Transition of Care [facility]) - CM/SW Discharge Note   Patient Details  Name: Alfred Brown MRN: [id number] Date of Birth: 09-03-1950  [facility] of [facility]) CM/SW Contact:  Iona Beard, [place] Phone Number: 03/29/2022, 4:24 PM   Clinical Narrative:    CSW spoke with family and MD. Pts family wish for pt to return home with [facility] PT/RN services through [place]. CSW updated Vaughan Basta of plan for D/C. MD placing order for 3N1. CSW sent referral to [facility] with [employer] and asked that DME be drop shipped to pts home. TOC signing off.   Final next level of care: Home w Home Health Services Barriers to Discharge: Barriers Resolved   Patient Goals and CMS Choice CMS Medicare.gov Compare Post Acute Care list provided to:: Patient Represenative (must comment) Choice offered to / list presented to : Spouse  Discharge Placement                         Discharge Plan and Services Additional resources added to the After Visit Summary for                  DME Arranged: 3-N-1 DME Agency: [employer] Date DME Agency Contacted: 03/29/22   Representative spoke with at DME Agency: Erasmo Downer HH Arranged: RN, PT [facility] Agency: [place] ([place]) Date [place]: 03/29/22   Representative spoke with at [place]: [place] ([place]) Interventions [place]: No Food Insecurity (03/02/2022)  Housing: Low Risk  (03/02/2022)  Transportation Needs: No Transportation Needs (03/02/2022)  Recent Concern: Transportation Needs - Unmet Transportation Needs (01/28/2022)  Utilities: Not At Risk (03/02/2022)  Depression (PHQ2-9): Low Risk  (02/27/2022)  Tobacco Use: Medium Risk (03/28/2022)     Readmission Risk Interventions    01/28/2022   10:39 AM 01/21/2022   11:57 AM  Readmission Risk Prevention Plan  Transportation Screening Complete Complete  HRI or [place]  Complete  Social Work Consult for [place]  Planning/Counseling  Complete  Palliative Care Screening  Not Applicable  Medication Review Press photographer) Complete Complete  PCP or Specialist appointment within 3-5 days of discharge Not Complete   HRI or [place] Complete   Palliative Care Screening Not Complete   [place] Not Applicable

## 2022-03-29 NOTE — Progress Notes (Signed)
Telemetry called nurse to inform that patient was showing Atrial flutter on monitor and heart rate sustaining in the 130's-140's. Nurse paged Dr. Bridgett Larsson and new orders in chart.

## 2022-04-01 DIAGNOSIS — R7881 Bacteremia: Secondary | ICD-10-CM | POA: Diagnosis not present

## 2022-04-01 DIAGNOSIS — E43 Unspecified severe protein-calorie malnutrition: Secondary | ICD-10-CM | POA: Diagnosis not present

## 2022-04-01 DIAGNOSIS — C9110 Chronic lymphocytic leukemia of B-cell type not having achieved remission: Secondary | ICD-10-CM | POA: Diagnosis not present

## 2022-04-01 DIAGNOSIS — J449 Chronic obstructive pulmonary disease, unspecified: Secondary | ICD-10-CM | POA: Diagnosis not present

## 2022-04-01 DIAGNOSIS — C7951 Secondary malignant neoplasm of bone: Secondary | ICD-10-CM | POA: Diagnosis not present

## 2022-04-01 DIAGNOSIS — C3492 Malignant neoplasm of unspecified part of left bronchus or lung: Secondary | ICD-10-CM | POA: Diagnosis not present

## 2022-04-03 ENCOUNTER — Encounter: Payer: Self-pay | Admitting: *Deleted

## 2022-04-03 NOTE — Progress Notes (Signed)
Dr. Delton Coombes talked to him about hospice.  Wife is not ready yet.  She is okay with palliative and transition to hospice.  He does not need follow-up with him.  Future appointments cancelled.

## 2022-04-04 ENCOUNTER — Other Ambulatory Visit: Payer: Self-pay

## 2022-04-04 DIAGNOSIS — C9110 Chronic lymphocytic leukemia of B-cell type not having achieved remission: Secondary | ICD-10-CM | POA: Diagnosis not present

## 2022-04-04 DIAGNOSIS — C3492 Malignant neoplasm of unspecified part of left bronchus or lung: Secondary | ICD-10-CM | POA: Diagnosis not present

## 2022-04-04 DIAGNOSIS — E43 Unspecified severe protein-calorie malnutrition: Secondary | ICD-10-CM | POA: Diagnosis not present

## 2022-04-04 DIAGNOSIS — R7881 Bacteremia: Secondary | ICD-10-CM | POA: Diagnosis not present

## 2022-04-04 DIAGNOSIS — C7951 Secondary malignant neoplasm of bone: Secondary | ICD-10-CM | POA: Diagnosis not present

## 2022-04-04 DIAGNOSIS — J449 Chronic obstructive pulmonary disease, unspecified: Secondary | ICD-10-CM | POA: Diagnosis not present

## 2022-04-04 MED ORDER — OXYCODONE HCL ER 20 MG PO T12A: 20.0000 mg | EXTENDED_RELEASE_TABLET | Freq: Two times a day (BID) | ORAL | 0 refills | Status: AC

## 2022-04-04 MED ORDER — NYSTATIN 100000 UNIT/ML MT SUSP: 15.0000 mL | Freq: Four times a day (QID) | OROMUCOSAL | 1 refills | Status: AC

## 2022-04-08 ENCOUNTER — Encounter: Payer: Self-pay | Admitting: Hematology

## 2022-04-08 DIAGNOSIS — C7951 Secondary malignant neoplasm of bone: Secondary | ICD-10-CM | POA: Diagnosis not present

## 2022-04-08 DIAGNOSIS — R7881 Bacteremia: Secondary | ICD-10-CM | POA: Diagnosis not present

## 2022-04-08 DIAGNOSIS — J449 Chronic obstructive pulmonary disease, unspecified: Secondary | ICD-10-CM | POA: Diagnosis not present

## 2022-04-08 DIAGNOSIS — E43 Unspecified severe protein-calorie malnutrition: Secondary | ICD-10-CM | POA: Diagnosis not present

## 2022-04-08 DIAGNOSIS — C9110 Chronic lymphocytic leukemia of B-cell type not having achieved remission: Secondary | ICD-10-CM | POA: Diagnosis not present

## 2022-04-08 DIAGNOSIS — C3492 Malignant neoplasm of unspecified part of left bronchus or lung: Secondary | ICD-10-CM | POA: Diagnosis not present

## 2022-04-09 DIAGNOSIS — R630 Anorexia: Secondary | ICD-10-CM | POA: Diagnosis not present

## 2022-04-09 DIAGNOSIS — C7951 Secondary malignant neoplasm of bone: Secondary | ICD-10-CM | POA: Diagnosis not present

## 2022-04-09 DIAGNOSIS — R059 Cough, unspecified: Secondary | ICD-10-CM | POA: Diagnosis not present

## 2022-04-09 DIAGNOSIS — M6281 Muscle weakness (generalized): Secondary | ICD-10-CM | POA: Diagnosis not present

## 2022-04-09 DIAGNOSIS — R634 Abnormal weight loss: Secondary | ICD-10-CM | POA: Diagnosis not present

## 2022-04-09 DIAGNOSIS — Z515 Encounter for palliative care: Secondary | ICD-10-CM | POA: Diagnosis not present

## 2022-04-09 DIAGNOSIS — R64 Cachexia: Secondary | ICD-10-CM | POA: Diagnosis not present

## 2022-04-09 DIAGNOSIS — C3490 Malignant neoplasm of unspecified part of unspecified bronchus or lung: Secondary | ICD-10-CM | POA: Diagnosis not present

## 2022-04-10 ENCOUNTER — Ambulatory Visit: Payer: Medicare Other

## 2022-04-10 ENCOUNTER — Ambulatory Visit: Payer: Medicare Other | Admitting: Hematology

## 2022-04-10 ENCOUNTER — Other Ambulatory Visit: Payer: Medicare Other

## 2022-04-10 DIAGNOSIS — K219 Gastro-esophageal reflux disease without esophagitis: Secondary | ICD-10-CM | POA: Diagnosis not present

## 2022-04-10 DIAGNOSIS — C7951 Secondary malignant neoplasm of bone: Secondary | ICD-10-CM | POA: Diagnosis not present

## 2022-04-10 DIAGNOSIS — R531 Weakness: Secondary | ICD-10-CM | POA: Diagnosis not present

## 2022-04-10 DIAGNOSIS — J449 Chronic obstructive pulmonary disease, unspecified: Secondary | ICD-10-CM | POA: Diagnosis not present

## 2022-04-10 DIAGNOSIS — C3490 Malignant neoplasm of unspecified part of unspecified bronchus or lung: Secondary | ICD-10-CM | POA: Diagnosis not present

## 2022-04-10 DIAGNOSIS — C9591 Leukemia, unspecified, in remission: Secondary | ICD-10-CM | POA: Diagnosis not present

## 2022-04-10 DIAGNOSIS — B259 Cytomegaloviral disease, unspecified: Secondary | ICD-10-CM | POA: Diagnosis not present

## 2022-04-10 DIAGNOSIS — I471 Supraventricular tachycardia, unspecified: Secondary | ICD-10-CM | POA: Diagnosis not present

## 2022-04-10 DIAGNOSIS — E785 Hyperlipidemia, unspecified: Secondary | ICD-10-CM | POA: Diagnosis not present

## 2022-04-10 DIAGNOSIS — I4510 Unspecified right bundle-branch block: Secondary | ICD-10-CM | POA: Diagnosis not present

## 2022-04-10 DIAGNOSIS — F32A Depression, unspecified: Secondary | ICD-10-CM | POA: Diagnosis not present

## 2022-04-11 ENCOUNTER — Ambulatory Visit: Payer: Medicare Other | Admitting: Nurse Practitioner

## 2022-04-12 DIAGNOSIS — F32A Depression, unspecified: Secondary | ICD-10-CM | POA: Diagnosis not present

## 2022-04-12 DIAGNOSIS — C3490 Malignant neoplasm of unspecified part of unspecified bronchus or lung: Secondary | ICD-10-CM | POA: Diagnosis not present

## 2022-04-12 DIAGNOSIS — J449 Chronic obstructive pulmonary disease, unspecified: Secondary | ICD-10-CM | POA: Diagnosis not present

## 2022-04-12 DIAGNOSIS — C9591 Leukemia, unspecified, in remission: Secondary | ICD-10-CM | POA: Diagnosis not present

## 2022-04-12 DIAGNOSIS — I471 Supraventricular tachycardia, unspecified: Secondary | ICD-10-CM | POA: Diagnosis not present

## 2022-04-12 DIAGNOSIS — B259 Cytomegaloviral disease, unspecified: Secondary | ICD-10-CM | POA: Diagnosis not present

## 2022-04-16 ENCOUNTER — Ambulatory Visit (HOSPITAL_COMMUNITY): Payer: Medicare Other

## 2022-04-17 DIAGNOSIS — J449 Chronic obstructive pulmonary disease, unspecified: Secondary | ICD-10-CM | POA: Diagnosis not present

## 2022-04-17 DIAGNOSIS — I471 Supraventricular tachycardia, unspecified: Secondary | ICD-10-CM | POA: Diagnosis not present

## 2022-04-17 DIAGNOSIS — C3490 Malignant neoplasm of unspecified part of unspecified bronchus or lung: Secondary | ICD-10-CM | POA: Diagnosis not present

## 2022-04-17 DIAGNOSIS — C9591 Leukemia, unspecified, in remission: Secondary | ICD-10-CM | POA: Diagnosis not present

## 2022-04-17 DIAGNOSIS — F32A Depression, unspecified: Secondary | ICD-10-CM | POA: Diagnosis not present

## 2022-04-17 DIAGNOSIS — B259 Cytomegaloviral disease, unspecified: Secondary | ICD-10-CM | POA: Diagnosis not present

## 2022-04-22 ENCOUNTER — Ambulatory Visit: Payer: Medicare Other | Admitting: Hematology

## 2022-04-22 ENCOUNTER — Other Ambulatory Visit: Payer: Medicare Other

## 2022-04-22 ENCOUNTER — Ambulatory Visit: Payer: Medicare Other

## 2022-04-22 DIAGNOSIS — J449 Chronic obstructive pulmonary disease, unspecified: Secondary | ICD-10-CM | POA: Diagnosis not present

## 2022-04-22 DIAGNOSIS — C9591 Leukemia, unspecified, in remission: Secondary | ICD-10-CM | POA: Diagnosis not present

## 2022-04-22 DIAGNOSIS — B259 Cytomegaloviral disease, unspecified: Secondary | ICD-10-CM | POA: Diagnosis not present

## 2022-04-22 DIAGNOSIS — F32A Depression, unspecified: Secondary | ICD-10-CM | POA: Diagnosis not present

## 2022-04-22 DIAGNOSIS — C3490 Malignant neoplasm of unspecified part of unspecified bronchus or lung: Secondary | ICD-10-CM | POA: Diagnosis not present

## 2022-04-22 DIAGNOSIS — I471 Supraventricular tachycardia, unspecified: Secondary | ICD-10-CM | POA: Diagnosis not present

## 2022-04-23 DIAGNOSIS — J449 Chronic obstructive pulmonary disease, unspecified: Secondary | ICD-10-CM | POA: Diagnosis not present

## 2022-04-23 DIAGNOSIS — C3490 Malignant neoplasm of unspecified part of unspecified bronchus or lung: Secondary | ICD-10-CM | POA: Diagnosis not present

## 2022-04-23 DIAGNOSIS — F32A Depression, unspecified: Secondary | ICD-10-CM | POA: Diagnosis not present

## 2022-04-23 DIAGNOSIS — B259 Cytomegaloviral disease, unspecified: Secondary | ICD-10-CM | POA: Diagnosis not present

## 2022-04-23 DIAGNOSIS — I471 Supraventricular tachycardia, unspecified: Secondary | ICD-10-CM | POA: Diagnosis not present

## 2022-04-23 DIAGNOSIS — C9591 Leukemia, unspecified, in remission: Secondary | ICD-10-CM | POA: Diagnosis not present

## 2022-04-25 ENCOUNTER — Telehealth: Payer: Self-pay | Admitting: Cardiology

## 2022-04-25 NOTE — Telephone Encounter (Signed)
Wife called to report patient passed away.

## 2022-04-30 ENCOUNTER — Ambulatory Visit: Payer: Medicare Other | Admitting: Cardiology

## 2022-05-02 DEATH — deceased

## 2022-05-03 ENCOUNTER — Ambulatory Visit: Payer: Medicare Other | Admitting: Internal Medicine

## 2022-12-20 IMAGING — MR MR LUMBAR SPINE W/O CM
5 series · 31 of 48 positions shown · non-contrast
Comparison: None similar

CLINICAL DATA: Lower back pain radiating to the left leg for months

EXAM:
MRI LUMBAR SPINE WITHOUT CONTRAST
TECHNIQUE: Multiplanar, multisequence MR imaging of the lumbar spine was
performed. No intravenous contrast was administered.

[Series 5: T1 · sagittal · 4.0mm · 0.81mm/px · 6 of 17 slices shown (1 of 2)]
[im 1/17]
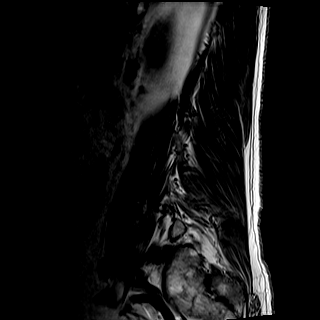
[im 4/17]
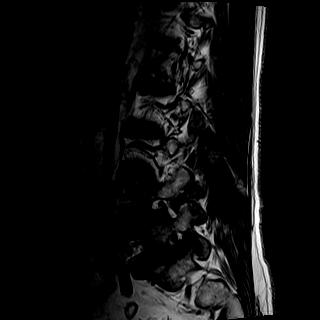
[im 7/17]
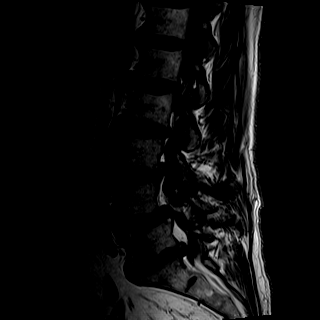
[im 10/17]
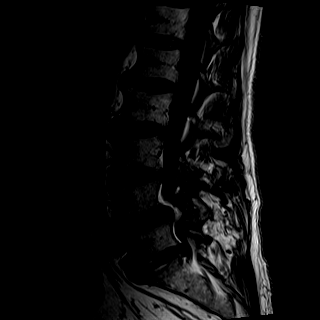
[im 13/17]
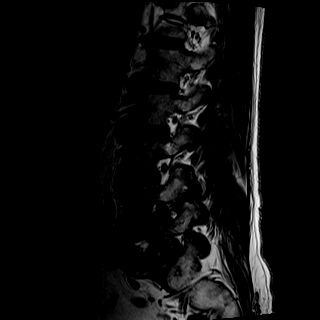
[im 17/17]
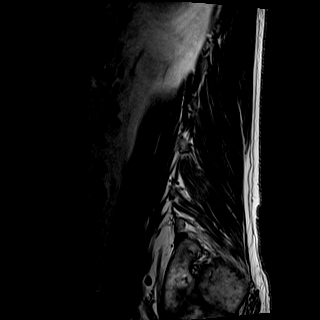

[Series 6: T2 · sagittal · 4.0mm · 0.68mm/px · 7 of 17 slices shown (1 of 2)]
[im 1/17]
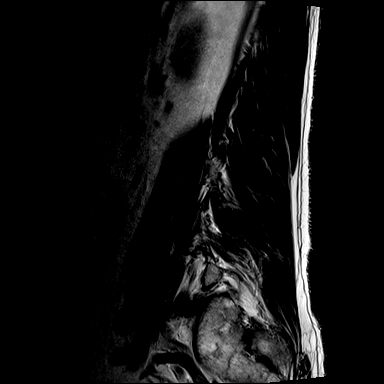
[im 3/17]
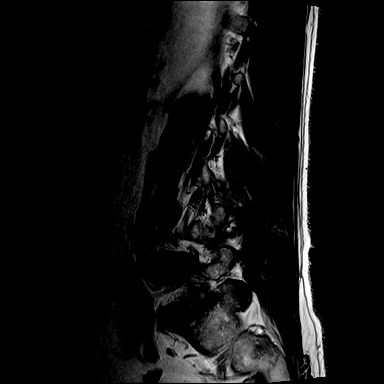
[im 6/17]
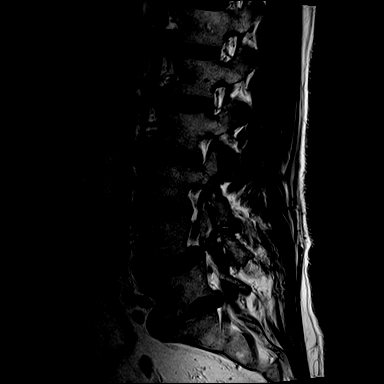
[im 9/17]
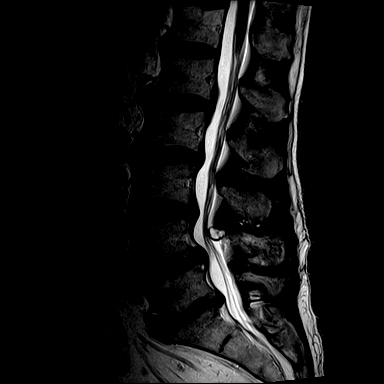
[im 11/17]
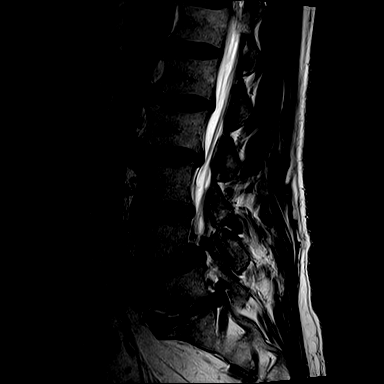
[im 14/17]
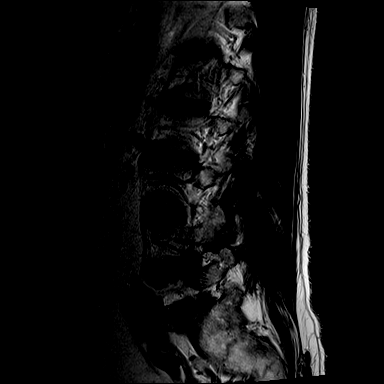
[im 17/17]
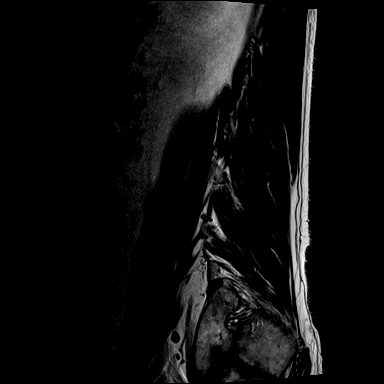

[Series 7: STIR · sagittal · 4.0mm · 0.51mm/px · 2 of 17 slices shown]
[im 1/17]
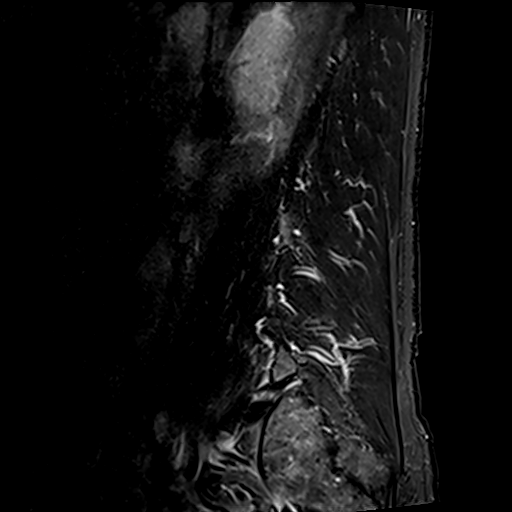
[im 3/17]
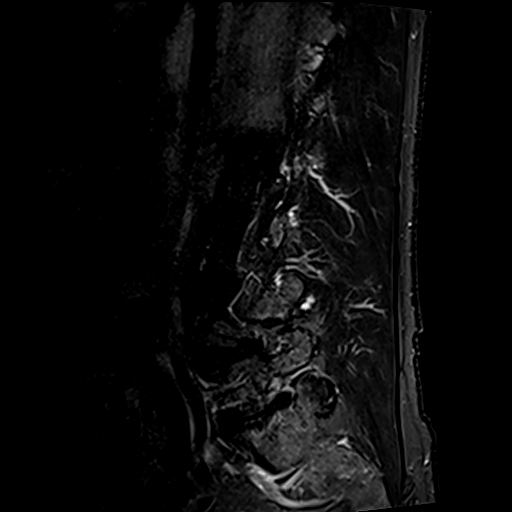

[Series 8: T2 · axial · 4.0mm · 0.70mm/px · z∈[-206,-5]mm · 8 of 36 slices shown (2 of 2)]
[im 1/36]
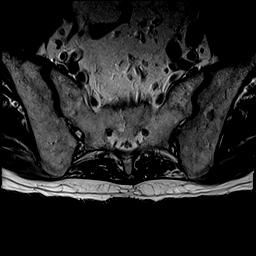
[im 6/36]
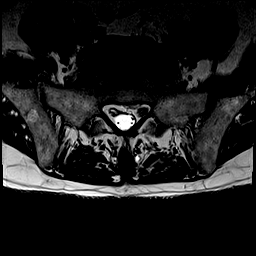
[im 11/36]
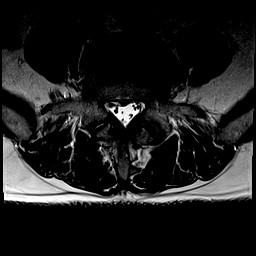
[im 17/36]
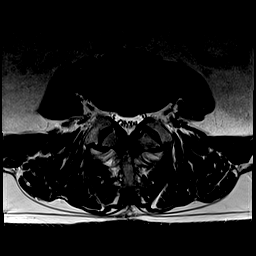
[im 19/36]
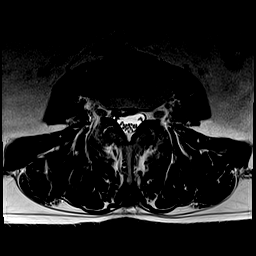
[im 25/36]
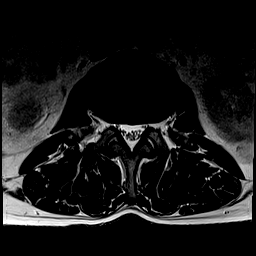
[im 30/36]
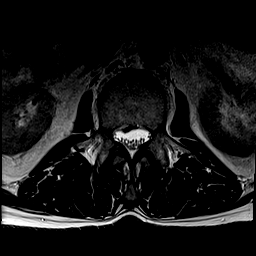
[im 36/36]
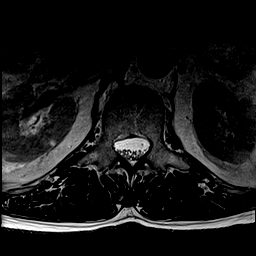

[Series 9: T1 · axial · 4.0mm · 0.35mm/px · z∈[-206,-5]mm · 8 of 36 slices shown (2 of 2)]
[im 1/36]
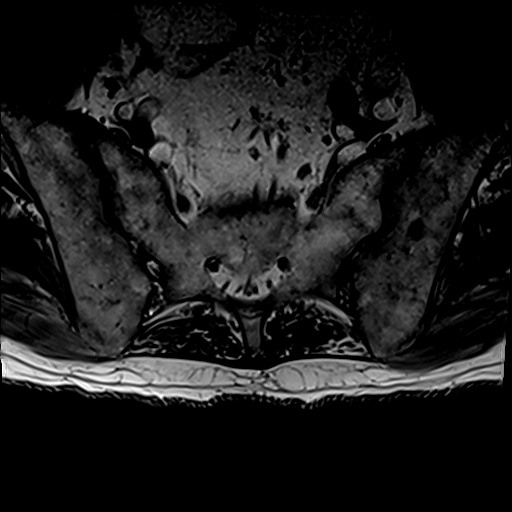
[im 6/36]
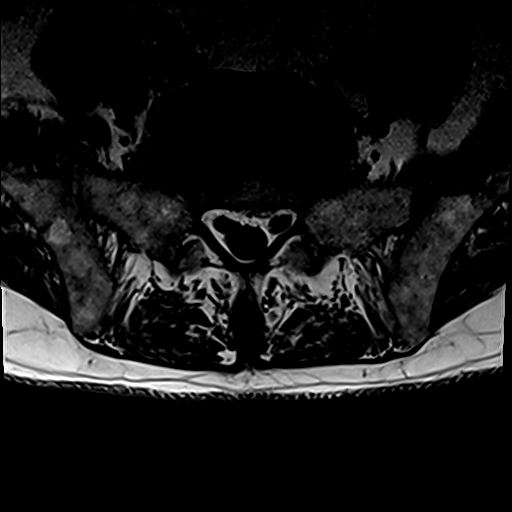
[im 11/36]
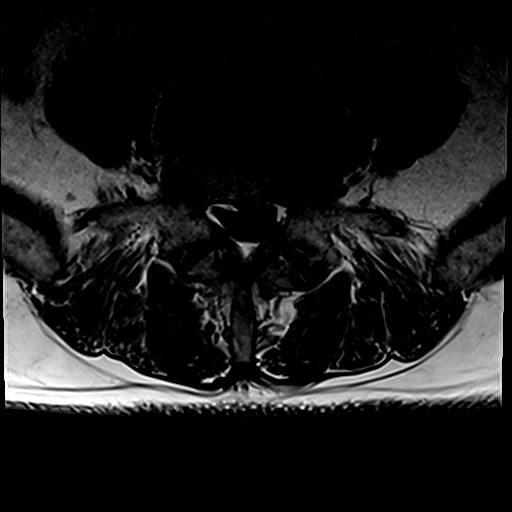
[im 17/36]
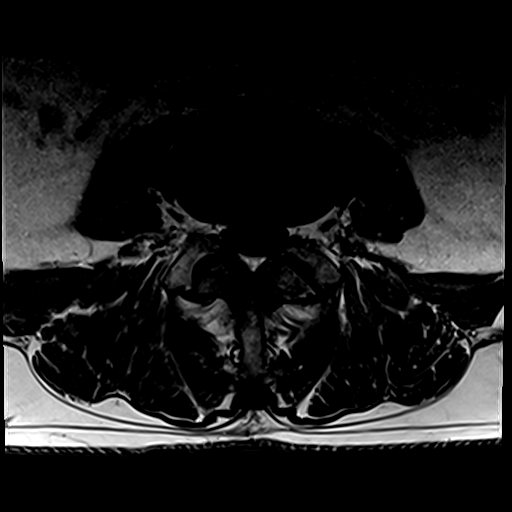
[im 19/36]
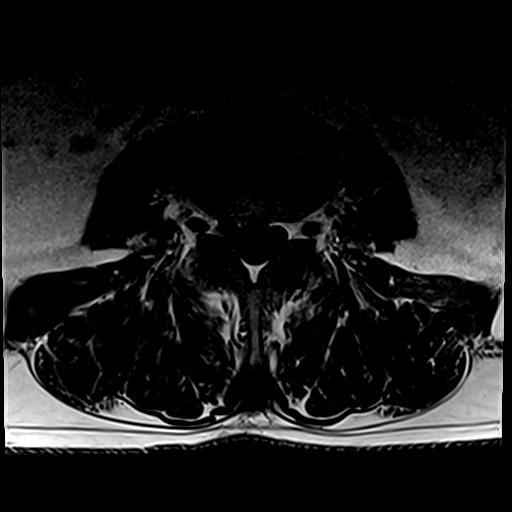
[im 25/36]
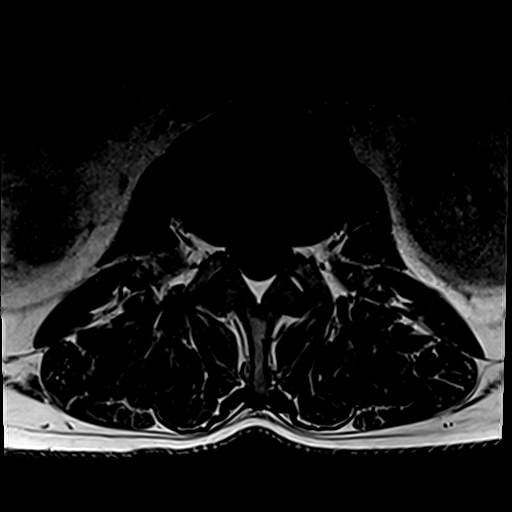
[im 30/36]
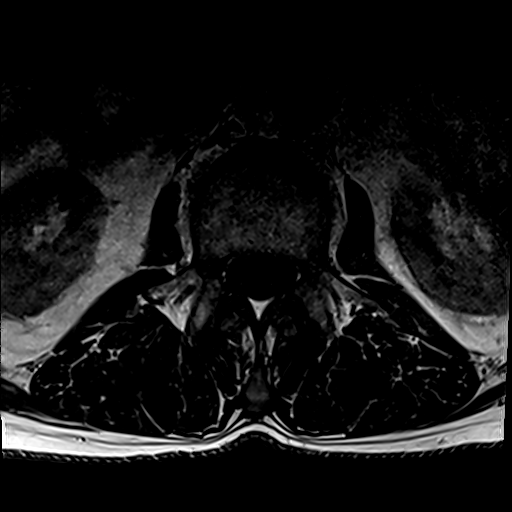
[im 36/36]
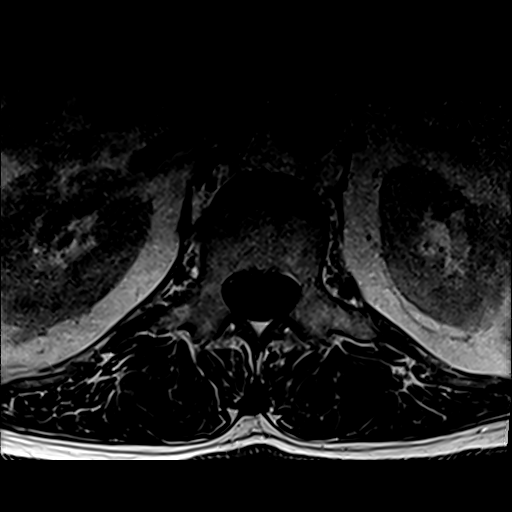

[31 of 48 positions shown; findings below may reference images not displayed]

FINDINGS: Segmentation:  5 lumbar type vertebrae

Alignment: Grade 1 anterolisthesis at L4-5 related to chronic L4
pars defects.

Vertebrae: L4 chronic pars defects. No acute fracture, discitis, or
aggressive bone lesion

Conus medullaris and cauda equina: Conus extends to the T12-L1
level. Conus and cauda equina appear normal.

Paraspinal and other soft tissues: No evidence of perispinal mass or
inflammation

Disc levels:

T12- L1: Ventral spondylitic spurring

L1-L2: Disc narrowing and bulging with upward and downward pointing
right paracentral extrusion that is noncompressive.

L2-L3: Disc narrowing and bulging with shallow central protrusion.
Negative facets. Mild spinal stenosis

L3-L4: Disc narrowing and bulging with right paracentral extrusion
that is upward pointing and compresses the right L4 nerve root.
Degenerative facet spurring and ligamentous thickening

L4-L5: L4 chronic pars defects with anterolisthesis. Disc bulging
with left foraminal herniation. Marked left L4 foraminal
compression. Moderate thecal sac narrowing. There is degenerative
ganglion in the posterior midline measuring 8 mm

L5-S1:Disc narrowing and bulging with left paracentral herniation
impinging on the left S1 nerve root. Mild left foraminal narrowing.
IMPRESSION: 1. Generalized lumbar spine degeneration with L4 chronic pars
defects and L4-5 anterolisthesis.
2. L3-4 right paracentral extrusion impinging on the right L4 nerve
root.
3. L4-5 left foraminal protrusion with L4 compression. Moderate
spinal stenosis at this level.
4. L5-S1 left paracentral protrusion and left S1 impingement.

## 2023-09-08 IMAGING — DX DG CHEST 2V
2 series · 2 of 2 positions shown · non-contrast
Comparison: Chest radiograph dated March 15, 2021

CLINICAL DATA: Cough for 3 weeks.

EXAM:
CHEST - 2 VIEW

[chest pa]
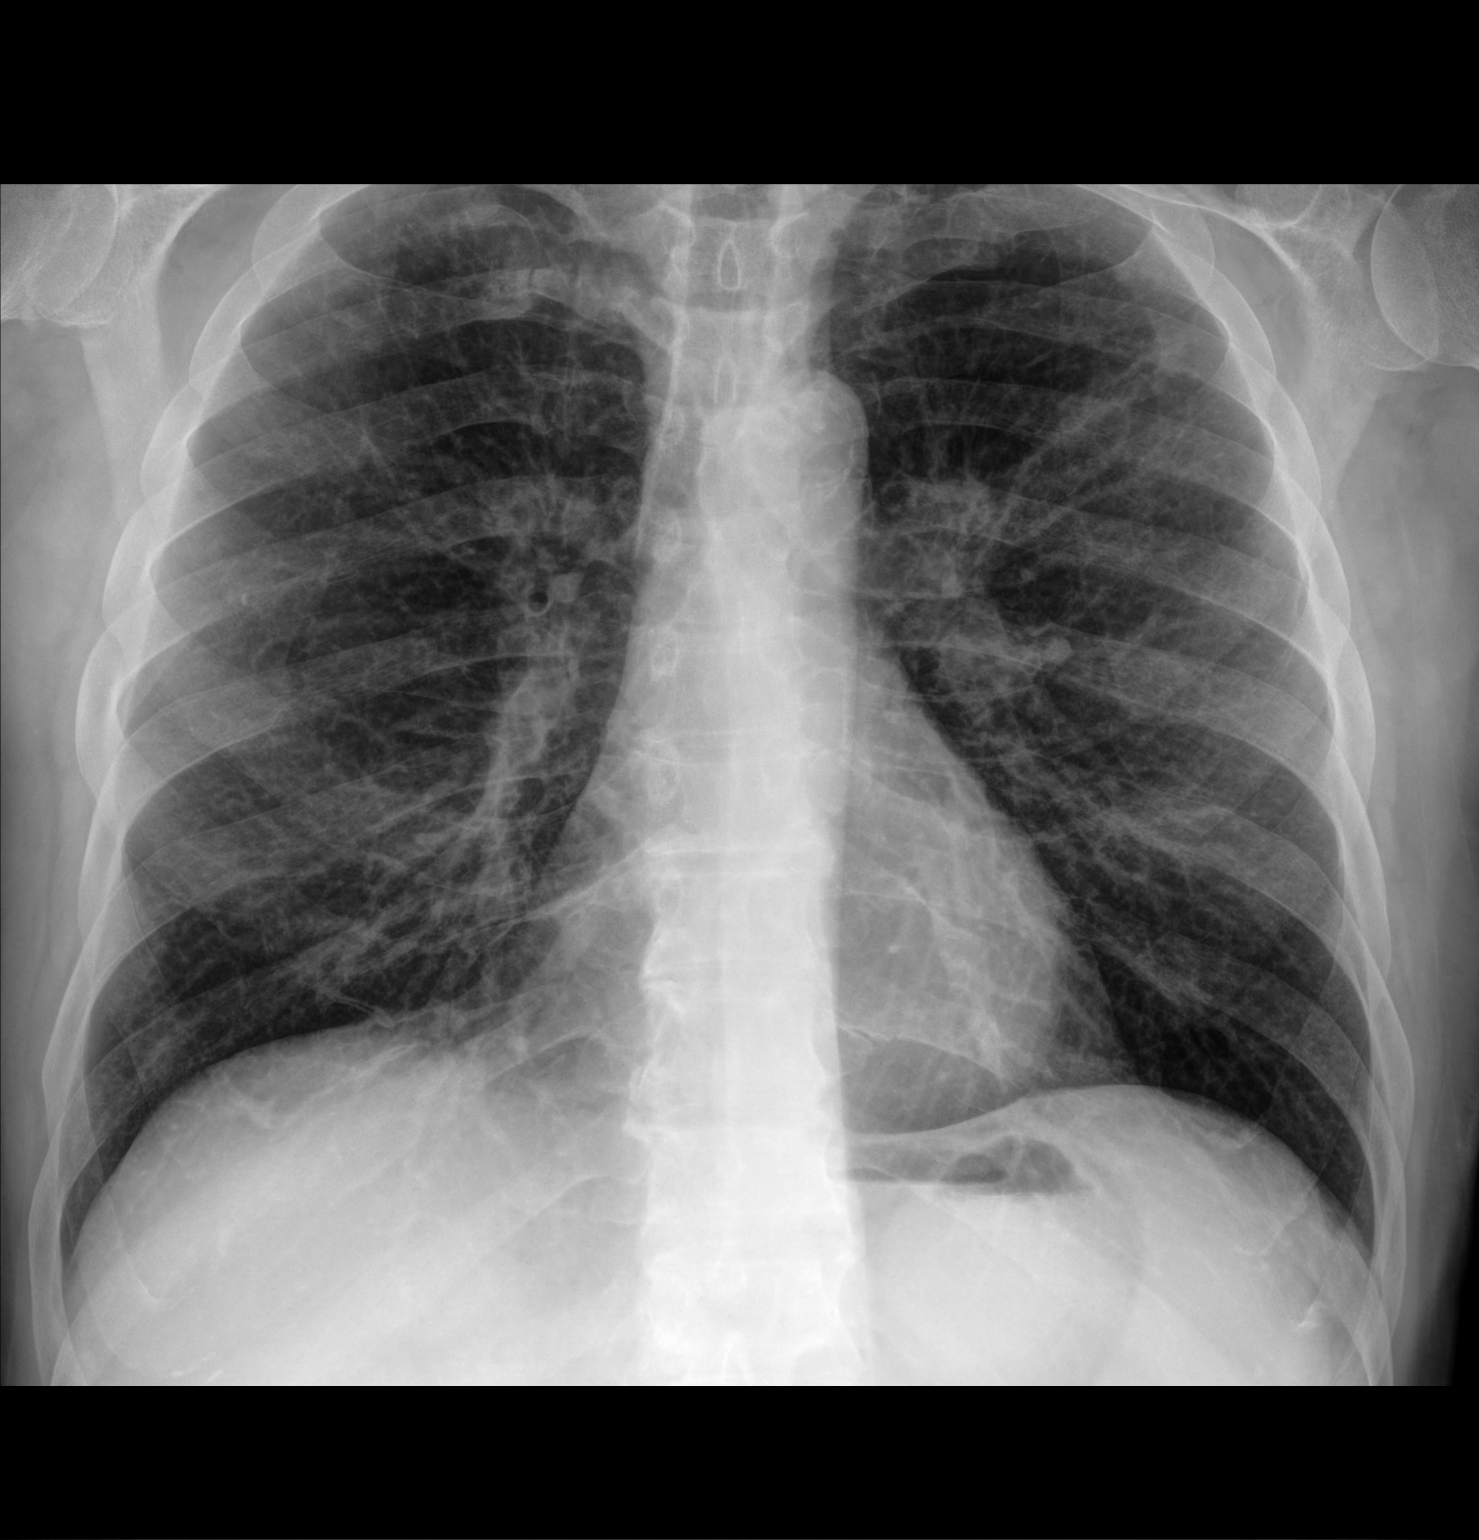

[chest lat]
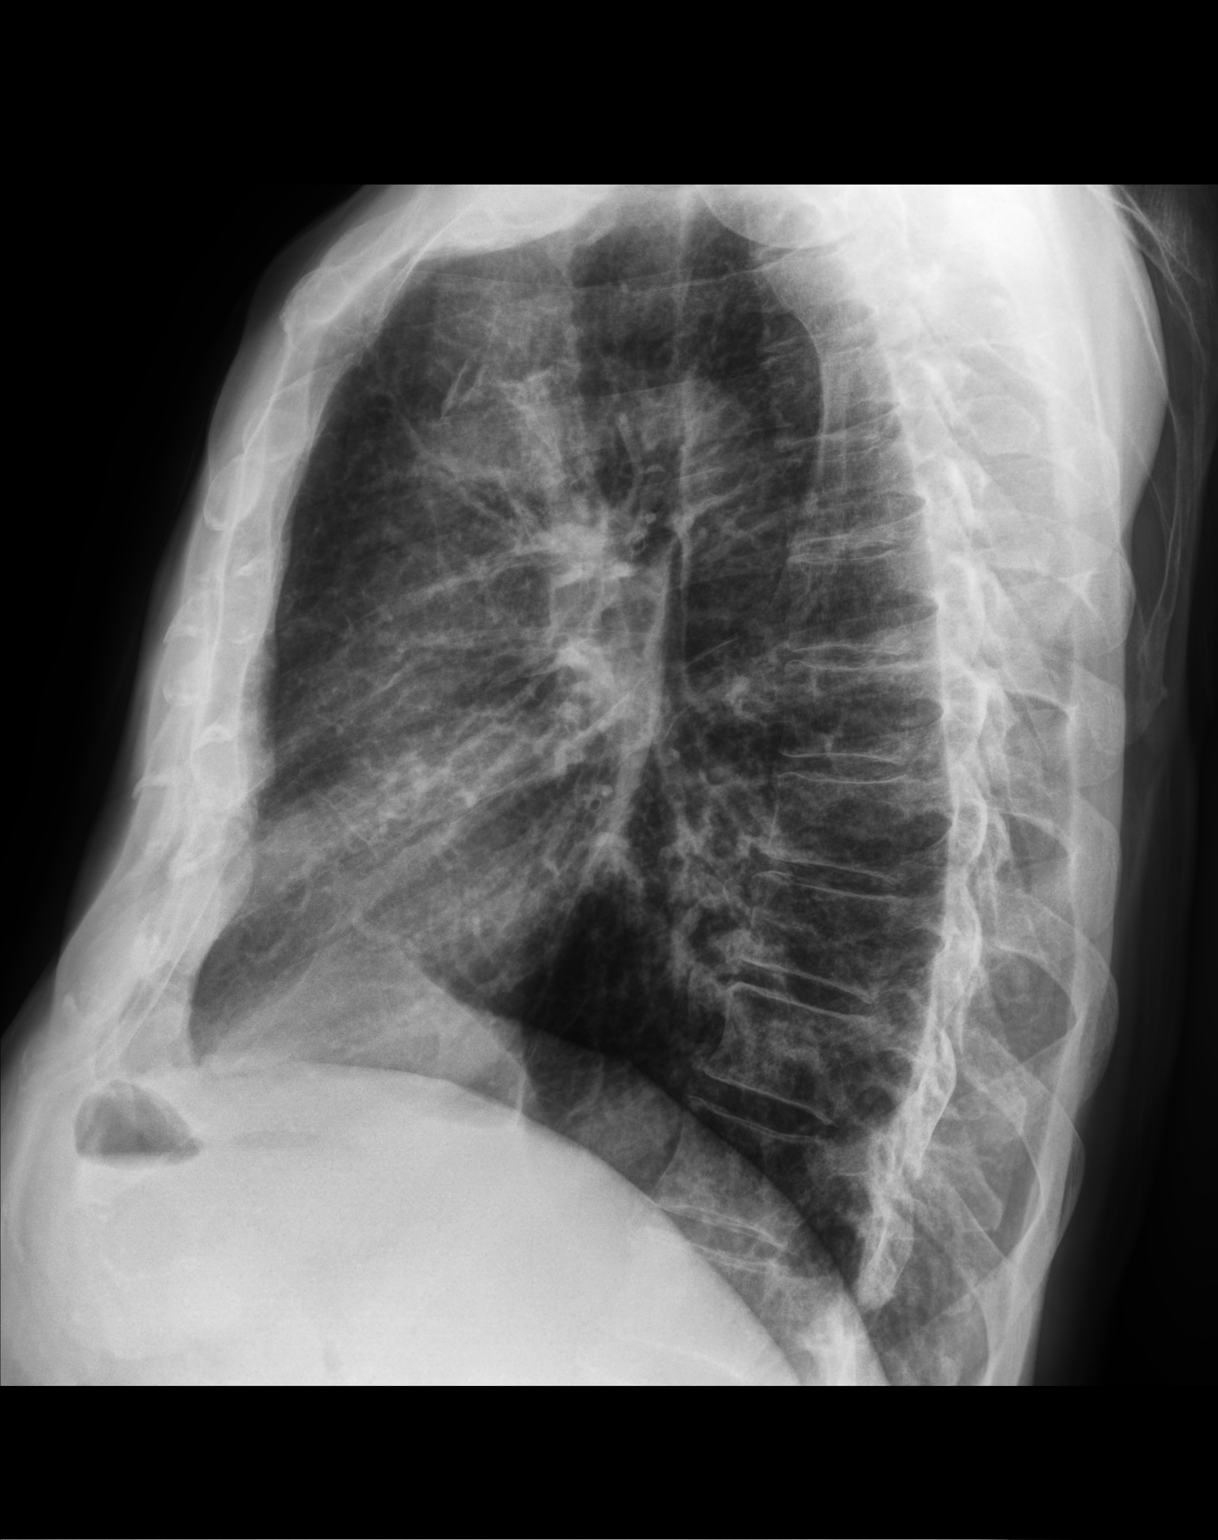

[2 of 2 positions shown; findings below may reference images not displayed]

FINDINGS: The heart size and mediastinal contours are within normal limits. No
focal consolidation or pleural effusion. Prominence of the bronchial
markings, which may be seen in viral bronchitis. The visualized
skeletal structures are unremarkable.
IMPRESSION: 1.  No focal consolidation or pleural effusion.

2. Prominent bilateral bronchial markings, which may represent
reactive airway disease or viral bronchitis.

## 2023-09-18 IMAGING — DX DG CHEST 2V
2 series · 2 of 2 positions shown · non-contrast
Comparison: August 27, 2021

CLINICAL DATA: Cough and chest pain.

EXAM:
CHEST - 2 VIEW

[chest pa]
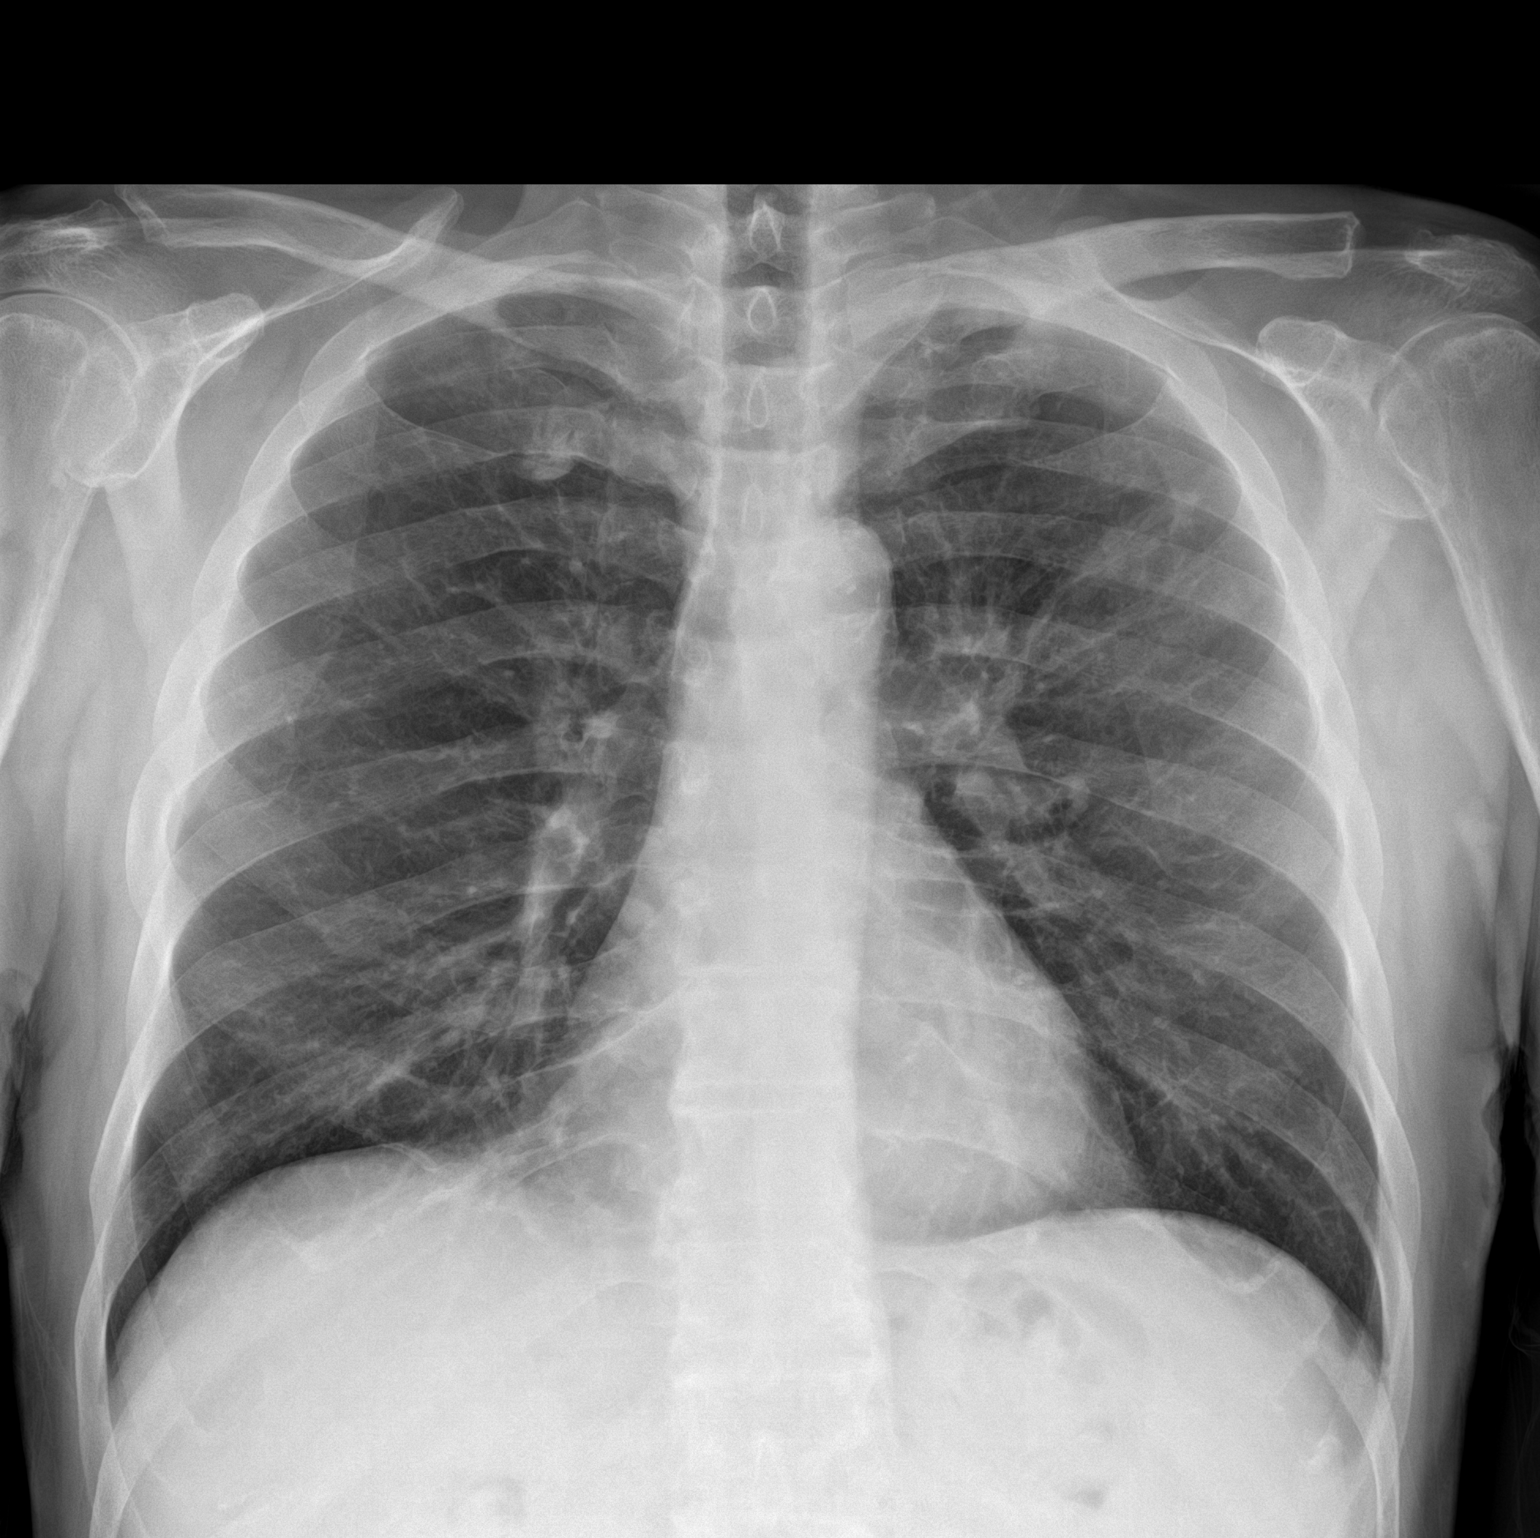

[chest lat]
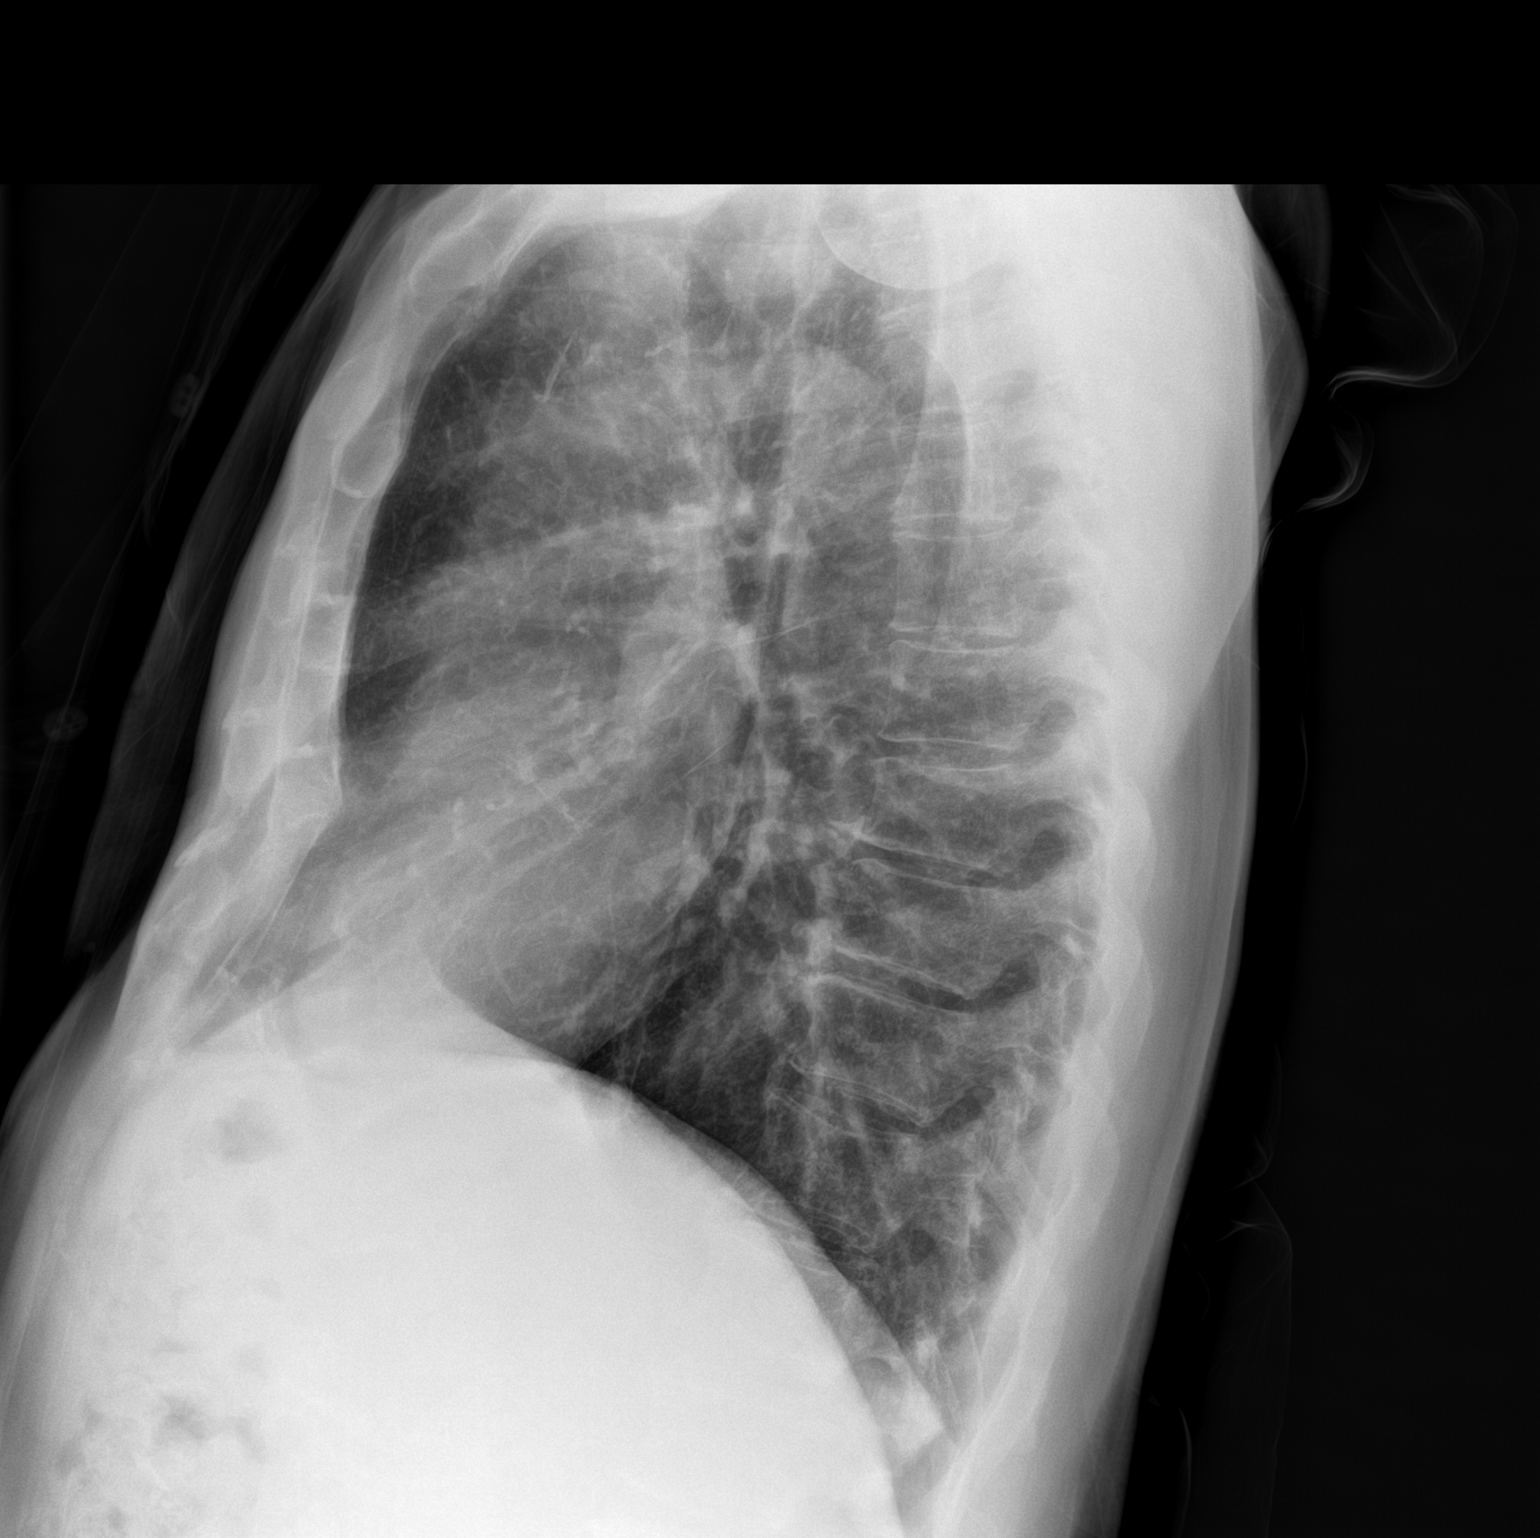

[2 of 2 positions shown; findings below may reference images not displayed]

FINDINGS: The heart size and mediastinal contours are within normal limits.
Both lungs are clear. The visualized skeletal structures are stable.
IMPRESSION: No active cardiopulmonary disease.
